# Patient Record
Sex: Male | Born: 1965 | Race: Black or African American | Hispanic: No | Marital: Single | Smoking: Former smoker
Health system: Southern US, Community
[De-identification: ages and names within clinical notes are randomized; demographics above are authoritative.]

## PROBLEM LIST (undated history)

## (undated) DIAGNOSIS — N184 Chronic kidney disease, stage 4 (severe): Secondary | ICD-10-CM

## (undated) DIAGNOSIS — I509 Heart failure, unspecified: Secondary | ICD-10-CM

## (undated) DIAGNOSIS — I10 Essential (primary) hypertension: Secondary | ICD-10-CM

## (undated) DIAGNOSIS — J45909 Unspecified asthma, uncomplicated: Secondary | ICD-10-CM

## (undated) DIAGNOSIS — M109 Gout, unspecified: Secondary | ICD-10-CM

## (undated) DIAGNOSIS — C20 Malignant neoplasm of rectum: Secondary | ICD-10-CM

## (undated) DIAGNOSIS — E119 Type 2 diabetes mellitus without complications: Secondary | ICD-10-CM

## (undated) HISTORY — DX: Chronic kidney disease, stage 4 (severe): N18.4

## (undated) HISTORY — DX: Malignant neoplasm of rectum: C20

## (undated) HISTORY — PX: NO PAST SURGERIES: SHX2092

## (undated) NOTE — *Deleted (*Deleted)
HD#1 Subjective:   Overnight Events: ***  ***  Objective:   Vital signs in last 24 hours: Vitals:   01/06/20 1930 01/06/20 2125 01/06/20 2204 01/07/20 0201  BP: 119/82 140/71 (!) 170/94 137/73  Pulse: (!) 53 62 66 (!) 59  Resp: (!) 23 (!) 21 (!) 22 18  Temp:   97.6 F (36.4 C) 97.7 F (36.5 C)  TempSrc:   Oral Oral  SpO2: 98% 97% 96% 98%  Weight:   121.2 kg   Height:       Supplemental O2: {NAMES:3044014::"Room Air","Nasal Cannula","Simple Face Mask","Partial Rebreather","HFNC","Non Rebreather","Venturi Mask","Bag Valve Mask"} SpO2: 98 % O2 Flow Rate (L/min): 2 L/min  Physical Exam Constitutional: well-appearing *** sitting in chair, in no acute distress HENT: normocephalic atraumatic, mucous membranes moist Eyes: conjunctiva non-erythematous Neck: supple Cardiovascular: regular rate and rhythm, no m/r/g Pulmonary/Chest: normal work of breathing on room air, lungs clear to auscultation bilaterally Abdominal: soft, non-tender, non-distended MSK: normal bulk and tone Neurological: alert & oriented x 3, 5/5 strength in bilateral upper and lower extremities, normal gait Skin: *** Psych: ***  Filed Weights   01/06/20 1617 01/06/20 2204  Weight: 117.9 kg 121.2 kg     Intake/Output Summary (Last 24 hours) at 01/07/2020 0559 Last data filed at 01/07/2020 0558 Gross per 24 hour  Intake 240 ml  Output 1280 ml  Net -1040 ml   Net IO Since Admission: -1,040 mL [01/07/20 0559]  Pertinent Labs: CBC Latest Ref Rng & Units 01/07/2020 01/06/2020 01/06/2020  WBC 4.0 - 10.5 K/uL 12.4(H) - 11.0(H)  Hemoglobin 13.0 - 17.0 g/dL 10.8(L) 10.5(L) 10.1(L)  Hematocrit 39 - 52 % 34.3(L) 31.0(L) 33.3(L)  Platelets 150 - 400 K/uL 241 - 207    CMP Latest Ref Rng & Units 01/07/2020 01/06/2020 01/06/2020  Glucose 70 - 99 mg/dL 83 96 102(H)  BUN 6 - 20 mg/dL 102(H) 110(H) 100(H)  Creatinine 0.61 - 1.24 mg/dL 5.03(H) 5.60(H) 4.92(H)  Sodium 135 - 145 mmol/L 137 139 136  Potassium 3.5 -  5.1 mmol/L 5.2(H) 5.3(H) 5.2(H)  Chloride 98 - 111 mmol/L 106 111 110  CO2 22 - 32 mmol/L 16(L) - 15(L)  Calcium 8.9 - 10.3 mg/dL 8.3(L) - 8.2(L)  Total Protein 6.0 - 8.5 g/dL - - -  Total Bilirubin 0.0 - 1.2 mg/dL - - -  Alkaline Phos 44 - 121 IU/L - - -  AST 0 - 40 IU/L - - -  ALT 0 - 44 IU/L - - -    Imaging: DG Chest Port 1 View  Result Date: 01/06/2020 CLINICAL DATA:  65 year old male with shortness of breath and bilateral lower extremity swelling. EXAM: PORTABLE CHEST 1 VIEW COMPARISON:  Chest radiograph dated 12/25/2019. FINDINGS: There is mild cardiomegaly with mild central vascular congestion. No edema. No focal consolidation, pleural effusion, or pneumothorax. No acute osseous pathology. IMPRESSION: 1. No focal consolidation or edema. 2. Mild cardiomegaly with probable mild central vascular congestion. Electronically Signed   By: Anner Crete M.D.   On: 01/06/2020 16:59    Assessment/Plan:   Principal Problem:   Volume overload Active Problems:   Type 2 diabetes mellitus without complication (HCC)   Essential hypertension   Asthma   Acute renal failure superimposed on stage 4 chronic kidney disease ([place])   Patient Summary: Craig West is a 29 y.o. *** with a pertinent PMH of ***, who presented with *** and was admitted for ***.    *** ***  *** ***  *** ***  *** ***  Diet: [id number] Healthy","Carb-Modified","Renal","Carb/Renal","NPO","TPN","Tube Feeds"} IVF: {NAMES:3044014::"None","NS","1/2 NS","LR","D5","D10"},{NAMES:3044014::"None","10cc/hr","25cc/hr","50cc/hr","75cc/hr","100cc/hr","110cc/hr","125cc/hr","Bolus"} VTE: {NAMES:3044014::"Heparin","Enoxaparin","SCDs","NOAC","None"} Code: {NAMES:3044014::"Full","DNR","DNI","DNR/DNI","Comfort Care","Unknown"} PT/OT recs: {NAMES:3044014::"None","Pending","CIR","SNF for Subacute PT","LTAC","Home Health"}, {Assistive Devices JX:[id number]. TOC recs: ***   Dispo: Anticipated discharge to  {Discharge Destination:18313::"Home"} in {NUMBERS:20191} days pending ***.    Please contact the on call pager after 5 pm and on weekends at [phone].  Alexandria Lodge, MD PGY-1 Internal Medicine Teaching Service Pager: [phone] 01/07/2020

---

## 2007-06-14 ENCOUNTER — Emergency Department (HOSPITAL_COMMUNITY): Admission: EM | Admit: 2007-06-14 | Discharge: 2007-06-14 | Payer: Self-pay | Admitting: Emergency Medicine

## 2007-10-05 ENCOUNTER — Emergency Department (HOSPITAL_COMMUNITY): Admission: EM | Admit: 2007-10-05 | Discharge: 2007-10-05 | Payer: Self-pay | Admitting: Emergency Medicine

## 2007-12-17 ENCOUNTER — Emergency Department (HOSPITAL_COMMUNITY): Admission: EM | Admit: 2007-12-17 | Discharge: 2007-12-17 | Payer: Self-pay | Admitting: Family Medicine

## 2008-02-27 ENCOUNTER — Emergency Department (HOSPITAL_COMMUNITY): Admission: EM | Admit: 2008-02-27 | Discharge: 2008-02-27 | Payer: Self-pay | Admitting: Emergency Medicine

## 2008-07-12 ENCOUNTER — Emergency Department (HOSPITAL_COMMUNITY): Admission: EM | Admit: 2008-07-12 | Discharge: 2008-07-12 | Payer: Self-pay | Admitting: Emergency Medicine

## 2008-12-25 ENCOUNTER — Emergency Department (HOSPITAL_COMMUNITY): Admission: EM | Admit: 2008-12-25 | Discharge: 2008-12-25 | Payer: Self-pay | Admitting: Emergency Medicine

## 2009-06-23 ENCOUNTER — Emergency Department (HOSPITAL_COMMUNITY): Admission: EM | Admit: 2009-06-23 | Discharge: 2009-06-23 | Payer: Self-pay | Admitting: Emergency Medicine

## 2009-07-25 ENCOUNTER — Emergency Department (HOSPITAL_COMMUNITY): Admission: EM | Admit: 2009-07-25 | Discharge: 2009-07-25 | Payer: Self-pay | Admitting: Emergency Medicine

## 2009-09-19 ENCOUNTER — Ambulatory Visit: Payer: Self-pay | Admitting: Internal Medicine

## 2009-09-19 LAB — CONVERTED CEMR LAB
BUN: 18 mg/dL (ref 6–23)
Benzodiazepines.: NEGATIVE
CO2: 25 meq/L (ref 19–32)
Chloride: 97 meq/L (ref 96–112)
Creatinine,U: 108.2 mg/dL
Glucose, Bld: 278 mg/dL — ABNORMAL HIGH (ref 70–99)
Hgb A1c MFr Bld: 9.5 % — ABNORMAL HIGH (ref <5.7)
Marijuana Metabolite: NEGATIVE
Methadone: NEGATIVE
Microalb, Ur: 31.2 mg/dL — ABNORMAL HIGH (ref 0.00–1.89)
Phencyclidine (PCP): NEGATIVE

## 2009-09-28 ENCOUNTER — Ambulatory Visit: Payer: Self-pay | Admitting: Internal Medicine

## 2009-10-31 ENCOUNTER — Ambulatory Visit: Payer: Self-pay | Admitting: Internal Medicine

## 2009-11-14 ENCOUNTER — Ambulatory Visit: Payer: Self-pay | Admitting: Internal Medicine

## 2010-06-19 LAB — GLUCOSE, CAPILLARY: Glucose-Capillary: 195 mg/dL — ABNORMAL HIGH (ref 70–99)

## 2010-06-24 LAB — POCT I-STAT, CHEM 8
BUN: 24 mg/dL — ABNORMAL HIGH (ref 6–23)
Creatinine, Ser: 1.5 mg/dL (ref 0.4–1.5)
Glucose, Bld: 154 mg/dL — ABNORMAL HIGH (ref 70–99)
Potassium: 3.6 mEq/L (ref 3.5–5.1)
Sodium: 140 mEq/L (ref 135–145)

## 2010-07-06 LAB — POCT I-STAT, CHEM 8
BUN: 17 mg/dL (ref 6–23)
Calcium, Ion: 1.05 mmol/L — ABNORMAL LOW (ref 1.12–1.32)
Chloride: 104 mEq/L (ref 96–112)
Creatinine, Ser: 1.2 mg/dL (ref 0.4–1.5)
Glucose, Bld: 188 mg/dL — ABNORMAL HIGH (ref 70–99)
HCT: 48 % (ref 39.0–52.0)
Hemoglobin: 16.3 g/dL (ref 13.0–17.0)
Potassium: 3.8 mEq/L (ref 3.5–5.1)
Sodium: 138 meq/L (ref 135–145)
TCO2: 26 mmol/L (ref 0–100)

## 2010-07-06 LAB — GLUCOSE, CAPILLARY: Glucose-Capillary: 252 mg/dL — ABNORMAL HIGH (ref 70–99)

## 2010-07-06 LAB — WOUND CULTURE: Culture: NO GROWTH

## 2010-07-11 LAB — URINALYSIS, ROUTINE W REFLEX MICROSCOPIC
Bilirubin Urine: NEGATIVE
Glucose, UA: >1000 mg/dL — AB
Hgb urine dipstick: NEGATIVE
Nitrite: NEGATIVE
Specific Gravity, Urine: 1.035 — ABNORMAL HIGH (ref 1.005–1.030)
pH: 5.5 (ref 5.0–8.0)

## 2010-07-11 LAB — BASIC METABOLIC PANEL
BUN: 14 mg/dL (ref 6–23)
CO2: 28 mEq/L (ref 19–32)
Creatinine, Ser: 1.01 mg/dL (ref 0.4–1.5)
GFR calc non Af Amer: >60 mL/min (ref >60)

## 2010-07-11 LAB — URINE MICROSCOPIC-ADD ON

## 2010-12-24 LAB — I-STAT 8, (EC8 V) (CONVERTED LAB)
Glucose, Bld: 193 — ABNORMAL HIGH
Hemoglobin: 16
Operator id: 277751
Sodium: 137
pCO2, Ven: 38.7 — ABNORMAL LOW
pH, Ven: 7.444 — ABNORMAL HIGH

## 2010-12-24 LAB — POCT I-STAT CREATININE
Creatinine, Ser: 1.4
Operator id: 277751

## 2010-12-24 LAB — CBC
MCHC: 33.9
MCV: 89.1

## 2010-12-24 LAB — DIFFERENTIAL
Basophils Absolute: 0.1
Basophils Relative: 0
Eosinophils Absolute: 0.3
Lymphs Abs: 3.2
Monocytes Absolute: 0.5
Monocytes Relative: 4
Neutro Abs: 10.1 — ABNORMAL HIGH

## 2010-12-24 LAB — POCT CARDIAC MARKERS: CKMB, poc: 1.8

## 2011-01-01 LAB — POCT I-STAT, CHEM 8
Hemoglobin: 14.6
Potassium: 5.3 — ABNORMAL HIGH
Sodium: 137

## 2011-01-01 LAB — POCT CARDIAC MARKERS
CKMB, poc: 4
Myoglobin, poc: 127

## 2014-01-22 ENCOUNTER — Emergency Department (HOSPITAL_COMMUNITY)
Admission: EM | Admit: 2014-01-22 | Discharge: 2014-01-22 | Disposition: A | Discharge status: Home / Self Care | Payer: Self-pay | Attending: Emergency Medicine | Admitting: Emergency Medicine

## 2014-01-22 ENCOUNTER — Encounter (HOSPITAL_COMMUNITY): Payer: Self-pay | Admitting: Emergency Medicine

## 2014-01-22 DIAGNOSIS — I10 Essential (primary) hypertension: Secondary | ICD-10-CM | POA: Insufficient documentation

## 2014-01-22 DIAGNOSIS — E119 Type 2 diabetes mellitus without complications: Secondary | ICD-10-CM | POA: Insufficient documentation

## 2014-01-22 DIAGNOSIS — L2389 Allergic contact dermatitis due to other agents: Secondary | ICD-10-CM | POA: Insufficient documentation

## 2014-01-22 DIAGNOSIS — L0889 Other specified local infections of the skin and subcutaneous tissue: Secondary | ICD-10-CM | POA: Insufficient documentation

## 2014-01-22 DIAGNOSIS — S50811A Abrasion of right forearm, initial encounter: Secondary | ICD-10-CM | POA: Insufficient documentation

## 2014-01-22 DIAGNOSIS — L089 Local infection of the skin and subcutaneous tissue, unspecified: Secondary | ICD-10-CM

## 2014-01-22 DIAGNOSIS — Z720 Tobacco use: Secondary | ICD-10-CM | POA: Insufficient documentation

## 2014-01-22 DIAGNOSIS — X58XXXA Exposure to other specified factors, initial encounter: Secondary | ICD-10-CM | POA: Insufficient documentation

## 2014-01-22 DIAGNOSIS — Z8679 Personal history of other diseases of the circulatory system: Secondary | ICD-10-CM

## 2014-01-22 DIAGNOSIS — Y9389 Activity, other specified: Secondary | ICD-10-CM | POA: Insufficient documentation

## 2014-01-22 DIAGNOSIS — L239 Allergic contact dermatitis, unspecified cause: Secondary | ICD-10-CM

## 2014-01-22 DIAGNOSIS — Y990 Civilian activity done for income or pay: Secondary | ICD-10-CM | POA: Insufficient documentation

## 2014-01-22 DIAGNOSIS — Y9269 Other specified industrial and construction area as the place of occurrence of the external cause: Secondary | ICD-10-CM | POA: Insufficient documentation

## 2014-01-22 HISTORY — DX: Essential (primary) hypertension: I10

## 2014-01-22 HISTORY — DX: Type 2 diabetes mellitus without complications: E11.9

## 2014-01-22 MED ORDER — PREDNISONE 20 MG PO TABS: ORAL_TABLET | ORAL | Status: DC

## 2014-01-22 MED ORDER — DIPHENHYDRAMINE HCL 25 MG PO CAPS: 25.0000 mg | ORAL_CAPSULE | Freq: Once | ORAL | Status: DC

## 2014-01-22 MED ORDER — PREDNISONE 20 MG PO TABS
60.0000 mg | ORAL_TABLET | Freq: Once | ORAL | Status: AC
  Administered 2014-01-22: 60 mg via ORAL
  Filled 2014-01-22: qty 3

## 2014-01-22 MED ORDER — TRAMADOL HCL 50 MG PO TABS: 50.0000 mg | ORAL_TABLET | Freq: Four times a day (QID) | ORAL | Status: DC | PRN

## 2014-01-22 MED ORDER — TRAMADOL HCL 50 MG PO TABS
50.0000 mg | ORAL_TABLET | Freq: Once | ORAL | Status: AC
  Administered 2014-01-22: 50 mg via ORAL
  Filled 2014-01-22: qty 1

## 2014-01-22 MED ORDER — LISINOPRIL-HYDROCHLOROTHIAZIDE 10-12.5 MG PO TABS: 1.0000 | ORAL_TABLET | Freq: Every day | ORAL | Status: DC

## 2014-01-22 MED ORDER — DIPHENHYDRAMINE HCL 25 MG PO CAPS
25.0000 mg | ORAL_CAPSULE | Freq: Once | ORAL | Status: AC
  Administered 2014-01-22: 25 mg via ORAL
  Filled 2014-01-22: qty 1

## 2014-01-22 MED ORDER — MUPIROCIN CALCIUM 2 % EX CREA: 1.0000 "application " | TOPICAL_CREAM | Freq: Three times a day (TID) | CUTANEOUS | Status: DC

## 2014-01-22 NOTE — ED Notes (Signed)
Declined W/C at D/C and was escorted to lobby by RN. 

## 2014-01-22 NOTE — ED Notes (Signed)
Hes had redness, pain and itching to bilateral arms from hands to elbows x 5 days. He tried anti-itch cream with no relief. 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 He denies any SOB, rash on rest of body.
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
## 2014-01-22 NOTE — Discharge Instructions (Signed)
Abrasions An abrasion is a cut or scrape of the skin. Abrasions do not go through all layers of the skin. HOME CARE  If a bandage (dressing) was put on your wound, change it as told by your doctor. If the bandage sticks, soak it off with warm.  Wash the area with water and soap 2 times a day. Rinse off the soap. Pat the area dry with a clean towel.  Put on medicated cream (ointment) as told by your doctor.  Change your bandage right away if it gets wet or dirty.  Only take medicine as told by your doctor.  See your doctor within 24-48 hours to get your wound checked.  Check your wound for redness, puffiness (swelling), or yellowish-white fluid (pus). GET HELP RIGHT AWAY IF:   You have more pain in the wound.  You have redness, swelling, or tenderness around the wound.  You have pus coming from the wound.  You have a fever or lasting symptoms for more than 2-3 days.  You have a fever and your symptoms suddenly get worse.  You have a bad smell coming from the wound or bandage. MAKE SURE YOU:   Understand these instructions.  Will watch your condition.  Will get help right away if you are not doing well or get worse. Document Released: 09/04/2007 Document Revised: 12/11/2011 Document Reviewed: 02/19/2011 ExitCare Patient Information 2015 [employer]. This information is not intended to replace advice given to you by your health care provider. Make sure you discuss any questions you have with your health care provider.  

## 2014-01-23 NOTE — ED Provider Notes (Signed)
Medical screening examination/treatment/procedure(s) were performed by non-physician practitioner and as supervising physician I was immediately available for consultation/collaboration.   EKG Interpretation None        Pamella Pert, MD 01/23/14 1102

## 2014-01-23 NOTE — Progress Notes (Signed)
CARE MANAGEMENT NOTE 01/23/2014  Patient:  Craig West, Craig West   Account Number:  [id number]  Date Initiated:  01/23/2014  Documentation initiated by:  [facility]  Subjective/Objective Assessment:   ED     Action/Plan:   med asst   Anticipated DC Date:  01/23/2014   Anticipated DC Plan:           Choice offered to / List presented to:             Status of service:  Completed, signed off Medicare Important Message given?   (If response is "NO", the following Medicare IM given date fields will be blank) Date Medicare IM given:   Medicare IM given by:   Date Additional Medicare IM given:   Additional Medicare IM given by:    Discharge Disposition:  HOME/SELF CARE  Per UR Regulation:    If discussed at Long Length of Stay Meetings, dates discussed:    Comments:  01/22/14 17:10 CM met with pt in ED and gave pt [place] letter with list of participating pharmacies.  Pt verbalized understanding of MATCH parameters.  CM gave pt [place] pamphlet and pt verbalized understanding he is to go to the Clinic any weekday morning from 9-10am and ask for: AN APPOINTMENT FOR A PCP; AN APPOINTMENT WITH A NAVIGATOR TO SECURE INSURANCE; AN APPOINTMENT FOR FOLLOW UP MEDICAL CARE.  No other CM needs were communicated.  Mariane Masters, BSN, CM [phone].

## 2014-01-27 NOTE — Care Management Note (Signed)
    Page 1 of 1   01/23/2014     7:46:07 AM CARE MANAGEMENT NOTE 01/23/2014  Patient:  TERRACE, FONTANILLA   Account Number:  [id number]  Date Initiated:  01/23/2014  Documentation initiated by:  [facility]  Subjective/Objective Assessment:   ED     Action/Plan:   med asst   Anticipated DC Date:  01/23/2014   Anticipated DC Plan:           Choice offered to / List presented to:             Status of service:  Completed, signed off Medicare Important Message given?   (If response is "NO", the following Medicare IM given date fields will be blank) Date Medicare IM given:   Medicare IM given by:   Date Additional Medicare IM given:   Additional Medicare IM given by:    Discharge Disposition:  HOME/SELF CARE  Per UR Regulation:    If discussed at Long Length of Stay Meetings, dates discussed:    Comments:  01/22/14 17:10 CM met with pt in ED and gave pt [place] letter with list of participating pharmacies.  Pt verbalized understanding of MATCH parameters.  CM gave pt [place] pamphlet and pt verbalized understanding he is to go to the Clinic any weekday morning from 9-10am and ask for: AN APPOINTMENT FOR A PCP; AN APPOINTMENT WITH A NAVIGATOR TO SECURE INSURANCE; AN APPOINTMENT FOR FOLLOW UP MEDICAL CARE.  No other CM needs were communicated.  Mariane Masters, BSN, CM [phone].

## 2014-02-10 ENCOUNTER — Emergency Department (HOSPITAL_COMMUNITY)
Admission: EM | Admit: 2014-02-10 | Discharge: 2014-02-11 | Disposition: A | Discharge status: Home / Self Care | Payer: Self-pay | Attending: Emergency Medicine | Admitting: Emergency Medicine

## 2014-02-10 ENCOUNTER — Encounter (HOSPITAL_COMMUNITY): Payer: Self-pay | Admitting: Emergency Medicine

## 2014-02-10 DIAGNOSIS — S0003XA Contusion of scalp, initial encounter: Secondary | ICD-10-CM

## 2014-02-10 DIAGNOSIS — Z720 Tobacco use: Secondary | ICD-10-CM | POA: Insufficient documentation

## 2014-02-10 DIAGNOSIS — I10 Essential (primary) hypertension: Secondary | ICD-10-CM | POA: Insufficient documentation

## 2014-02-10 DIAGNOSIS — Y9289 Other specified places as the place of occurrence of the external cause: Secondary | ICD-10-CM | POA: Insufficient documentation

## 2014-02-10 DIAGNOSIS — Z23 Encounter for immunization: Secondary | ICD-10-CM | POA: Insufficient documentation

## 2014-02-10 DIAGNOSIS — Y998 Other external cause status: Secondary | ICD-10-CM | POA: Insufficient documentation

## 2014-02-10 DIAGNOSIS — Z79899 Other long term (current) drug therapy: Secondary | ICD-10-CM | POA: Insufficient documentation

## 2014-02-10 DIAGNOSIS — M62830 Muscle spasm of back: Secondary | ICD-10-CM | POA: Insufficient documentation

## 2014-02-10 DIAGNOSIS — S40811A Abrasion of right upper arm, initial encounter: Secondary | ICD-10-CM | POA: Insufficient documentation

## 2014-02-10 DIAGNOSIS — S300XXA Contusion of lower back and pelvis, initial encounter: Secondary | ICD-10-CM | POA: Insufficient documentation

## 2014-02-10 DIAGNOSIS — S01511A Laceration without foreign body of lip, initial encounter: Secondary | ICD-10-CM | POA: Insufficient documentation

## 2014-02-10 DIAGNOSIS — J45909 Unspecified asthma, uncomplicated: Secondary | ICD-10-CM | POA: Insufficient documentation

## 2014-02-10 DIAGNOSIS — E119 Type 2 diabetes mellitus without complications: Secondary | ICD-10-CM | POA: Insufficient documentation

## 2014-02-10 DIAGNOSIS — Z7952 Long term (current) use of systemic steroids: Secondary | ICD-10-CM | POA: Insufficient documentation

## 2014-02-10 DIAGNOSIS — Y9389 Activity, other specified: Secondary | ICD-10-CM | POA: Insufficient documentation

## 2014-02-10 HISTORY — DX: Unspecified asthma, uncomplicated: J45.909

## 2014-02-10 MED ORDER — OXYCODONE-ACETAMINOPHEN 5-325 MG PO TABS
1.0000 | ORAL_TABLET | Freq: Once | ORAL | Status: AC
  Administered 2014-02-10: 1 via ORAL
  Filled 2014-02-10: qty 1

## 2014-02-10 MED ORDER — TETANUS-DIPHTH-ACELL PERTUSSIS 5-2.5-18.5 LF-MCG/0.5 IM SUSP
0.5000 mL | Freq: Once | INTRAMUSCULAR | Status: AC
  Administered 2014-02-11: 0.5 mL via INTRAMUSCULAR
  Filled 2014-02-10: qty 0.5

## 2014-02-10 MED ORDER — HYDROCODONE-ACETAMINOPHEN 5-325 MG PO TABS
1.0000 | ORAL_TABLET | Freq: Once | ORAL | Status: AC
  Administered 2014-02-11: 1 via ORAL
  Filled 2014-02-10: qty 1
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
## 2014-02-11 MED ORDER — NAPROXEN 500 MG PO TABS: 500.0000 mg | ORAL_TABLET | Freq: Two times a day (BID) | ORAL | Status: DC | PRN

## 2014-02-11 MED ORDER — HYDROCODONE-ACETAMINOPHEN 5-325 MG PO TABS: 1.0000 | ORAL_TABLET | Freq: Four times a day (QID) | ORAL | Status: DC | PRN

## 2014-02-11 MED ORDER — CYCLOBENZAPRINE HCL 10 MG PO TABS: 10.0000 mg | ORAL_TABLET | Freq: Three times a day (TID) | ORAL | Status: DC | PRN

## 2014-02-11 NOTE — Discharge Instructions (Signed)
Use naprosyn and vicodin for pain, but don't drive while taking vicodin. Get plenty of rest, use ice on your head and the areas of pain, no more than 20 minutes at a time every hour. Use flexeril as needed for muscle spasms in your back. Avoid heavy lifting until the pain resolves in your back. Keep all wounds clean and dry, covered with bandages and change then twice daily. Monitor for signs of infection such as drainage redness or swelling. Follow Up with primary care physician/[facility] in 1 week for recheck of symptoms. If you develop numbness or tingling in your legs or incontinence of urine/stool, or changes in mental status, return to the ER. Return to the emergency department if patient becomes lethargic, begins vomiting or other change in mental status.   Abrasions An abrasion is a cut or scrape of the skin. Abrasions do not go through all layers of the skin. HOME CARE  If a bandage (dressing) was put on your wound, change it as told by your doctor. If the bandage sticks, soak it off with warm.  Wash the area with water and soap 2 times a day. Rinse off the soap. Pat the area dry with a clean towel.  Put on medicated cream (ointment) as told by your doctor.  Change your bandage right away if it gets wet or dirty.  Only take medicine as told by your doctor.  See your doctor within 24-48 hours to get your wound checked.  Check your wound for redness, puffiness (swelling), or yellowish-white fluid (pus). GET HELP RIGHT AWAY IF:   You have more pain in the wound.  You have redness, swelling, or tenderness around the wound.  You have pus coming from the wound.  You have a fever or lasting symptoms for more than 2-3 days.  You have a fever and your symptoms suddenly get worse.  You have a bad smell coming from the wound or bandage. MAKE SURE YOU:   Understand these instructions.  Will watch your condition.  Will get help right away if you are not doing well or  get worse. Document Released: 09/04/2007 Document Revised: 12/11/2011 Document Reviewed: 02/19/2011 [facility] Patient Information 2015 [place], [place]. This information is not intended to replace advice given to you by your health care provider. Make sure you discuss any questions you have with your health care provider.  Contusion A contusion is a deep bruise. Contusions happen when an injury causes bleeding under the skin. Signs of bruising include pain, puffiness (swelling), and discolored skin. The contusion may turn blue, purple, or yellow. HOME CARE   Put ice on the injured area.  Put ice in a plastic bag.  Place a towel between your skin and the bag.  Leave the ice on for 15-20 minutes, 03-04 times a day.  Only take medicine as told by your doctor.  Rest the injured area.  If possible, raise (elevate) the injured area to lessen puffiness. GET HELP RIGHT AWAY IF:   You have more bruising or puffiness.  You have pain that is getting worse.  Your puffiness or pain is not helped by medicine. MAKE SURE YOU:   Understand these instructions.  Will watch your condition.  Will get help right away if you are not doing well or get worse. Document Released: 09/04/2007 Document Revised: 06/10/2011 Document Reviewed: 01/21/2011 [facility] Patient Information 2015 [place], [place]. This information is not intended to replace advice given to you by your health care provider. Make sure  you discuss any questions you have with your health care provider.  Facial or Scalp Contusion A facial or scalp contusion is a deep bruise on the face or head. Injuries to the face and head generally cause a lot of swelling, especially around the eyes. Contusions are the result of an injury that caused bleeding under the skin. The contusion may turn blue, purple, or yellow. Minor injuries will give you a painless contusion, but more severe contusions may stay painful and swollen for a few weeks.  CAUSES  A  facial or scalp contusion is caused by a blunt injury or trauma to the face or head area.  SIGNS AND SYMPTOMS   Swelling of the injured area.   Discoloration of the injured area.   Tenderness, soreness, or pain in the injured area.  DIAGNOSIS  The diagnosis can be made by taking a medical history and doing a physical exam. An X-ray exam, CT scan, or MRI may be needed to determine if there are any associated injuries, such as broken bones (fractures). TREATMENT  Often, the best treatment for a facial or scalp contusion is applying cold compresses to the injured area. Over-the-counter medicines may also be recommended for pain control.  HOME CARE INSTRUCTIONS   Only take over-the-counter or prescription medicines as directed by your health care provider.   Apply ice to the injured area.   Put ice in a plastic bag.   Place a towel between your skin and the bag.   Leave the ice on for 20 minutes, 2-3 times a day.  SEEK MEDICAL CARE IF:  You have bite problems.   You have pain with chewing.   You are concerned about facial defects. SEEK IMMEDIATE MEDICAL CARE IF:  You have severe pain or a headache that is not relieved by medicine.   You have unusual sleepiness, confusion, or personality changes.   You throw up (vomit).   You have a persistent nosebleed.   You have double vision or blurred vision.   You have fluid drainage from your nose or ear.   You have difficulty walking or using your arms or legs.  MAKE SURE YOU:   Understand these instructions.  Will watch your condition.  Will get help right away if you are not doing well or get worse. Document Released: 04/25/2004 Document Revised: 01/06/2013 Document Reviewed: 10/29/2012 [facility] Patient Information 2015 [place], [place]. This information is not intended to replace advice given to you by your health care provider. Make sure you discuss any questions you have with your health care  provider.   Cryotherapy Cryotherapy is when you put ice on your injury. Ice helps lessen pain and puffiness (swelling) after an injury. Ice works the best when you start using it in the first 24 to 48 hours after an injury. HOME CARE  Put a dry or damp towel between the ice pack and your skin.  You may press gently on the ice pack.  Leave the ice on for no more than 10 to 20 minutes at a time.  Check your skin after 5 minutes to make sure your skin is okay.  Rest at least 20 minutes between ice pack uses.  Stop using ice when your skin loses feeling (numbness).  Do not use ice on someone who cannot tell you when it hurts. This includes small children and people with memory problems (dementia). GET HELP RIGHT AWAY IF:  You have white spots on your skin.  Your skin turns blue or  pale.  Your skin feels waxy or hard.  Your puffiness gets worse. MAKE SURE YOU:   Understand these instructions.  Will watch your condition.  Will get help right away if you are not doing well or get worse. Document Released: 09/04/2007 Document Revised: 06/10/2011 Document Reviewed: 11/08/2010 [facility] Patient Information 2015 [place], [place]. This information is not intended to replace advice given to you by your health care provider. Make sure you discuss any questions you have with your health care provider.   Muscle Cramps and Spasms Muscle cramps and spasms are when muscles tighten by themselves. They usually get better within minutes. Muscle cramps are painful. They are usually stronger and last longer than muscle spasms. Muscle spasms may or may not be painful. They can last a few seconds or much longer. HOME CARE  Drink enough fluid to keep your pee (urine) clear or pale yellow.  Massage, stretch, and relax the muscle.  Use a warm towel, heating pad, or warm shower water on tight muscles.  Place ice on the muscle if it is tender or in pain.  Put ice in a plastic bag.  Place a towel  between your skin and the bag.  Leave the ice on for 15-20 minutes, 03-04 times a day.  Only take medicine as told by your doctor. GET HELP RIGHT AWAY IF:  Your cramps or spasms get worse, happen more often, or do not get better with time. MAKE SURE YOU:  Understand these instructions.  Will watch your condition.  Will get help right away if you are not doing well or get worse. Document Released: 02/29/2008 Document Revised: 07/13/2012 Document Reviewed: 03/04/2012 [facility] Patient Information 2015 [place], [place]. This information is not intended to replace advice given to you by your health care provider. Make sure you discuss any questions you have with your health care provider.  Wound Care Wound care helps prevent pain and infection.  You may need a tetanus shot if:  You cannot remember when you had your last tetanus shot.  You have never had a tetanus shot.  The injury broke your skin. If you need a tetanus shot and you choose not to have one, you may get tetanus. Sickness from tetanus can be serious. HOME CARE   Only take medicine as told by your doctor.  Clean the wound daily with mild soap and water.  Change any bandages (dressings) as told by your doctor.  Put medicated cream and a bandage on the wound as told by your doctor.  Change the bandage if it gets wet, dirty, or starts to smell.  Take showers. Do not take baths, swim, or do anything that puts your wound under water.  Rest and raise (elevate) the wound until the pain and puffiness (swelling) are better.  Keep all doctor visits as told. GET HELP RIGHT AWAY IF:   Yellowish-white fluid (pus) comes from the wound.  Medicine does not lessen your pain.  There is a red streak going away from the wound.  You have a fever. MAKE SURE YOU:   Understand these instructions.  Will watch your condition.  Will get help right away if you are not doing well or get worse. Document Released: 12/26/2007  Document Revised: 06/10/2011 Document Reviewed: 07/22/2010 [facility] Patient Information 2015 [place], [place]. This information is not intended to replace advice given to you by your health care provider. Make sure you discuss any questions you have with your health care provider.

## 2014-05-12 ENCOUNTER — Encounter (HOSPITAL_COMMUNITY): Payer: Self-pay | Admitting: Emergency Medicine

## 2014-05-12 ENCOUNTER — Emergency Department (HOSPITAL_COMMUNITY)
Admission: EM | Admit: 2014-05-12 | Discharge: 2014-05-12 | Disposition: A | Discharge status: Home / Self Care | Payer: Self-pay | Attending: Emergency Medicine | Admitting: Emergency Medicine

## 2014-05-12 DIAGNOSIS — M1A4720 Other secondary chronic gout, left ankle and foot, without tophus (tophi): Secondary | ICD-10-CM | POA: Insufficient documentation

## 2014-05-12 DIAGNOSIS — Z7952 Long term (current) use of systemic steroids: Secondary | ICD-10-CM | POA: Insufficient documentation

## 2014-05-12 DIAGNOSIS — M109 Gout, unspecified: Secondary | ICD-10-CM

## 2014-05-12 DIAGNOSIS — Z79899 Other long term (current) drug therapy: Secondary | ICD-10-CM | POA: Insufficient documentation

## 2014-05-12 DIAGNOSIS — E119 Type 2 diabetes mellitus without complications: Secondary | ICD-10-CM | POA: Insufficient documentation

## 2014-05-12 DIAGNOSIS — M79672 Pain in left foot: Secondary | ICD-10-CM | POA: Insufficient documentation

## 2014-05-12 DIAGNOSIS — J45909 Unspecified asthma, uncomplicated: Secondary | ICD-10-CM | POA: Insufficient documentation

## 2014-05-12 DIAGNOSIS — Z720 Tobacco use: Secondary | ICD-10-CM | POA: Insufficient documentation

## 2014-05-12 DIAGNOSIS — I10 Essential (primary) hypertension: Secondary | ICD-10-CM | POA: Insufficient documentation

## 2014-05-12 DIAGNOSIS — Z792 Long term (current) use of antibiotics: Secondary | ICD-10-CM | POA: Insufficient documentation

## 2014-05-12 MED ORDER — PREDNISONE 20 MG PO TABS: 20.0000 mg | ORAL_TABLET | Freq: Every day | ORAL | Status: DC

## 2014-05-12 MED ORDER — HYDROCODONE-ACETAMINOPHEN 5-325 MG PO TABS: 1.0000 | ORAL_TABLET | Freq: Four times a day (QID) | ORAL | Status: DC | PRN

## 2014-05-12 MED ORDER — INDOMETHACIN 25 MG PO CAPS: 50.0000 mg | ORAL_CAPSULE | Freq: Three times a day (TID) | ORAL | Status: DC | PRN

## 2014-05-12 MED ORDER — HYDROCODONE-ACETAMINOPHEN 5-325 MG PO TABS
2.0000 | ORAL_TABLET | Freq: Once | ORAL | Status: AC
  Administered 2014-05-12: 2 via ORAL
  Filled 2014-05-12: qty 2

## 2014-05-12 NOTE — ED Notes (Signed)
Spoke with Mariann Laster, RN case management in regards to pt lack of PCP and concerns about receiving PCP for HTN. States she will come speak with pt. Pt informed, PA informed.

## 2014-05-12 NOTE — Discharge Instructions (Signed)
Gout Gout is an inflammatory arthritis caused by a buildup of uric acid crystals in the joints. Uric acid is a chemical that is normally present in the blood. When the level of uric acid in the blood is too high it can form crystals that deposit in your joints and tissues. This causes joint redness, soreness, and swelling (inflammation). Repeat attacks are common. Over time, uric acid crystals can form into masses (tophi) near a joint, destroying bone and causing disfigurement. Gout is treatable and often preventable. CAUSES  The disease begins with elevated levels of uric acid in the blood. Uric acid is produced by your body when it breaks down a naturally found substance called purines. Certain foods you eat, such as meats and fish, contain high amounts of purines. Causes of an elevated uric acid level include:  Being passed down from parent to child (heredity).  Diseases that cause increased uric acid production (such as obesity, psoriasis, and certain cancers).  Excessive alcohol use.  Diet, especially diets rich in meat and seafood.  Medicines, including certain cancer-fighting medicines (chemotherapy), water pills (diuretics), and aspirin.  Chronic kidney disease. The kidneys are no longer able to remove uric acid well.  Problems with metabolism. Conditions strongly associated with gout include:  Obesity.  High blood pressure.  High cholesterol.  Diabetes. Not everyone with elevated uric acid levels gets gout. It is not understood why some people get gout and others do not. Surgery, joint injury, and eating too much of certain foods are some of the factors that can lead to gout attacks. SYMPTOMS   An attack of gout comes on quickly. It causes intense pain with redness, swelling, and warmth in a joint.  Fever can occur.  Often, only one joint is involved. Certain joints are more commonly involved:  Base of the big toe.  Knee.  Ankle.  Wrist.  Finger. Without  treatment, an attack usually goes away in a few days to weeks. Between attacks, you usually will not have symptoms, which is different from many other forms of arthritis. DIAGNOSIS  Your caregiver will suspect gout based on your symptoms and exam. In some cases, tests may be recommended. The tests may include:  Blood tests.  Urine tests.  X-rays.  Joint fluid exam. This exam requires a needle to remove fluid from the joint (arthrocentesis). Using a microscope, gout is confirmed when uric acid crystals are seen in the joint fluid. TREATMENT  There are two phases to gout treatment: treating the sudden onset (acute) attack and preventing attacks (prophylaxis).  Treatment of an Acute Attack.  Medicines are used. These include anti-inflammatory medicines or steroid medicines.  An injection of steroid medicine into the affected joint is sometimes necessary.  The painful joint is rested. Movement can worsen the arthritis.  You may use warm or cold treatments on painful joints, depending which works best for you.  Treatment to Prevent Attacks.  If you suffer from frequent gout attacks, your caregiver may advise preventive medicine. These medicines are started after the acute attack subsides. These medicines either help your kidneys eliminate uric acid from your body or decrease your uric acid production. You may need to stay on these medicines for a very long time.  The early phase of treatment with preventive medicine can be associated with an increase in acute gout attacks. For this reason, during the first few months of treatment, your caregiver may also advise you to take medicines usually used for acute gout treatment. Be sure you  understand your caregiver's directions. Your caregiver may make several adjustments to your medicine dose before these medicines are effective.  Discuss dietary treatment with your caregiver or dietitian. Alcohol and drinks high in sugar and fructose and foods  such as meat, poultry, and seafood can increase uric acid levels. Your caregiver or dietitian can advise you on drinks and foods that should be limited. HOME CARE INSTRUCTIONS   Do not take aspirin to relieve pain. This raises uric acid levels.  Only take over-the-counter or prescription medicines for pain, discomfort, or fever as directed by your caregiver.  Rest the joint as much as possible. When in bed, keep sheets and blankets off painful areas.  Keep the affected joint raised (elevated).  Apply warm or cold treatments to painful joints. Use of warm or cold treatments depends on which works best for you.  Use crutches if the painful joint is in your leg.  Drink enough fluids to keep your urine clear or pale yellow. This helps your body get rid of uric acid. Limit alcohol, sugary drinks, and fructose drinks.  Follow your dietary instructions. Pay careful attention to the amount of protein you eat. Your daily diet should emphasize fruits, vegetables, whole grains, and fat-free or low-fat milk products. Discuss the use of coffee, vitamin C, and cherries with your caregiver or dietitian. These may be helpful in lowering uric acid levels.  Maintain a healthy body weight. SEEK MEDICAL CARE IF:   You develop diarrhea, vomiting, or any side effects from medicines.  You do not feel better in 24 hours, or you are getting worse. SEEK IMMEDIATE MEDICAL CARE IF:   Your joint becomes suddenly more tender, and you have chills or a fever. MAKE SURE YOU:   Understand these instructions.  Will watch your condition.  Will get help right away if you are not doing well or get worse. Document Released: 03/15/2000 Document Revised: 08/02/2013 Document Reviewed: 10/30/2011 [facility] Patient Information 2015 [place], [place]. This information is not intended to replace advice given to you by your health care provider. Make sure you discuss any questions you have with your health care  provider.   Emergency Department Resource Guide 1) Find a Doctor and Pay Out of Pocket Although you won't have to find out who is covered by your insurance plan, it is a good idea to ask around and get recommendations. You will then need to call the office and see if the doctor you have chosen will accept you as a new patient and what types of options they offer for patients who are self-pay. Some doctors offer discounts or will set up payment plans for their patients who do not have insurance, but you will need to ask so you aren't surprised when you get to your appointment.  2) Contact Your Local Health Department Not all health departments have doctors that can see patients for sick visits, but many do, so it is worth a call to see if yours does. If you don't know where your local health department is, you can check in your phone book. The CDC also has a tool to help you locate your state's health department, and many state websites also have listings of all of their local health departments.  3) Find a [facility] If your illness is not likely to be very severe or complicated, you may want to try a walk in clinic. These are popping up all over the country in pharmacies, drugstores, and shopping centers. They're usually staffed by  nurse practitioners or physician assistants that have been trained to treat common illnesses and complaints. They're usually fairly quick and inexpensive. However, if you have serious medical issues or chronic medical problems, these are probably not your best option.  No Primary Care Doctor: - Call Health Connect at  [phone] - they can help you locate a primary care doctor that  accepts your insurance, provides certain services, etc. - Physician Referral Service- [phone]  Chronic Pain Problems: Organization         Address  Phone   Notes  [facility]  [phone] Patients need to be referred by their primary care doctor.    Medication Assistance: Organization         Address  Phone   Notes  [facility] Medication [facility] [address] [place], [place] [address] [phone] --Must be a resident of [facility] -- Must have NO insurance coverage whatsoever (no Medicaid/ Medicare, etc.) -- The pt. MUST have a primary care doctor that directs their care regularly and follows them in the community   MedAssist  [phone]   [employer]  [phone]    Agencies that provide inexpensive medical care: Organization         Address  Phone   Notes  [place]  [phone]   [facility]    [phone]   [facility] [place], [place] [address] [phone]   [employer] [address], [place] [phone]   Planned Parenthood    [phone]   [facility]    [phone]   [place] [address] [place] [address], [place] Phone:  [phone], Fax:  [phone] Hours of Operation:  9 am - 6 pm, M-F.  Also accepts Medicaid/Medicare and self-pay.  [facility] for [place], [address], [place] Phone: [phone], Fax: [phone]. Hours of Operation:  8:30 am - 5:30 pm, M-F.  Also accepts Medicaid and self-pay.  [facility] [place] [address], [place] Phone: [phone]   [place], [place], [place] [phone], Ext. [phone] Mondays & Thursdays: 7-9 AM.  First 15 patients are seen on a first come, first serve basis.    [place] Providers:  Organization         Address  Phone   Notes  [facility] [address], [place] [phone] Also accepts self-pay patients.  [facility] [id number] [place], [place]  [phone]   [place], [address], [place] [phone]   [facility] [address], [place] [phone]   Lucianne Lei [address], [place]   [phone] Only accepts [place] Access [place] patients after they have their name applied to their card.   Self-Pay (no insurance) in [facility]:  Organization         Address  Phone   Notes  Sickle Cell Patients, [facility] Internal Medicine [place] [phone]   [facility] [place] [phone]   [facility] [place]  [address], [address], [place] [phone]   [facility]/Dr. [place] Lawman  [address] [place] or [place], [address], [place] [phone] Phone number for both [place] and [place] locations is the same.  Urgent Medical and [facility] [address] [place] [phone]   [facility] [address] [place] or [address] [phone]   [facility] [address], [place] [phone], phone; [phone], fax Sees patients 1st and 3rd Saturday of every month.  Must not qualify for public or private insurance (i.e. Medicaid, Medicare, [place] Health Choice, Veterans' Benefits)  Household income should be no more than 200% of the poverty level The clinic cannot treat you if you are pregnant or think you are pregnant  Sexually transmitted diseases are not treated at the clinic.    Dental Care: Organization         Address  Phone  Notes  [facility] [place] [phone] Accepts children up to age 43 who are enrolled in [place] or [place]; pregnant women with a Medicaid card; and children who have applied for Medicaid or [place] Health Choice, but were declined, whose parents can pay a reduced fee at time of service.   [facility] Department of [facility]  [address], [place] [phone] Accepts children up to age 66 who are enrolled in [place] or [place]; pregnant women with a Medicaid card; and children who have applied for Medicaid or [place] Health Choice, but were declined, whose parents can pay a reduced fee at time of service.  [facility] Adult [facility] Access PROGRAM  [place] [phone] Patients are seen by appointment only. Walk-ins are not accepted. [place] will see patients 93 years of age and older. Monday - Tuesday (8am-5pm) Most Wednesdays (8:30-5pm) $30 per visit, cash only  [facility] Adult Dental Access PROGRAM  [address], [facility] [phone] Patients are seen by appointment only. Walk-ins are not accepted. [place] will see patients 34 years of age and older. One Wednesday Evening (Monthly: Volunteer Based).  $30 per visit, cash only  [place]  [phone] for adults; Children under age 38, call Graduate Pediatric Dentistry at [phone]. Children aged 43-14, please call [phone] to request a pediatric application.  Dental services are provided in all areas of dental care including fillings, crowns and bridges, complete and partial dentures, implants, gum treatment, root canals, and extractions. Preventive care is also provided. Treatment is provided to both adults and children. Patients are selected via a lottery and there is often a waiting list.   [facility] [address], [place]  [phone] [email or web address]   [facility] [address] [place], [place] [phone], Ext. [phone] Second and Fourth Thursday of each month, opens at 6:30 AM; Clinic ends at 9 AM.  Patients are seen on a first-come first-served basis, and a limited number are seen during each clinic.   [facility]  [address] [place]ard Danker [place], [place] [phone]   Eligibility Requirements You must have lived in [place], [place], or [place] counties for at least the last three months.   You cannot be eligible for state or federal sponsored [employer], including [employer], [place], or [employer].   You generally cannot be eligible for healthcare insurance through your employer.  How to apply: Eligibility screenings are held every Tuesday and Wednesday afternoon from 1:00 pm until 4:00 pm. You do not need an appointment for the interview!  [facility] [address] [place], [place]   [place]  [place] Department  [place]  [phone]    Behavioral Health Resources in the Community: Intensive Outpatient Programs Organization         Address  Phone  Notes  [place]. [address], [place], [place] [phone]   [facility] Outpatient [address] [place], [place]   ADS: [facility] Drug [facility] [address] [place], [place]   [place] [address]  [place], [place] or [phone]   Substance Abuse Resources Organization         Address  Phone  Notes  Alcohol and Drug Services  [phone]   [place]  [phone]   The [place]   Chinita Pester  [phone]   Residential & Outpatient Substance Abuse Program  [phone]   Psychological Services Organization         Address  Phone  Notes  [facility] [place]  [place]  [phone]   [place] [address] [place] or [phone]    Mobile Crisis Teams Organization         Address  Phone  Notes  [facility], Mobile Crisis Care Unit  [phone]   [facility]  [address] [place], [place]   [place] [address] [place] [phone]    Self-Help/Support Groups Organization         Address  Phone             Notes  [place]. of [place] - variety of support groups  [place] Call for more information  Narcotics Anonymous (NA), [facility] [address], [employer] [place]  2 meetings at this location   Special educational needs teacher         Address  Phone  Notes  [facility]idential [facility] [place],    [place]  [phone]   [facility]  [address], [place] [address], [place], [place]   [place], [place] [phone] Admissions: 8am-3pm M-F  Incentives Substance [place] [address]    [place], [place] [phone]   [facility] [address] [place], [place], [place]   The [facility] [address]  [place], [place]   [facility] - Intensive Outpatient [address], [place], [place]   [facility] [place].,  [place], [place] [phone] or [phone]   [facility] Treatment Services (RTS) [address] [place], [place] Accepts Medicaid  [facility] [place] [address]  [place] [phone] Substance Abuse/Addiction Treatment   [facility] Organization         Address  Phone  Notes  [facility]  [phone]   Domenic Schwab, PhD [address] Arlis [place], [place]   [phone] or [phone]   [place], [place] [phone]   [facility] [address],  [place], [place] [phone] Insurance/Medicaid/sponsorship through [facility] and Families [address] [place], [place] [phone]  [place] [address]   [place], [place] [phone]    Dr. Adele Schilder  [phone]   [facility] [place] Dept. 1) [address] [place] 2) [place] 3)  [place] [address], [place] [phone]  [phone]   [place] [phone] or [phone] (After Hours)

## 2014-05-12 NOTE — ED Provider Notes (Signed)
CSN: [id number]     Arrival date & time 05/12/14  1125 History   First MD Initiated Contact with Patient 05/12/14 1142     Chief Complaint  Patient presents with  . Foot Pain     (Consider location/radiation/quality/duration/timing/severity/associated sxs/prior Treatment) HPI Craig West is a 49 year old male with past medical history of diabetes, hypertension, asthma, gout who presents the ER complaining of bilateral foot pain. Patient reports pain in his left ankle and left MTP joint which has been gradually hurting worse the past week. Patient reports his pain feels consistent with previous gout flareups, and he reports associated erythema, swelling, painful range of motion. Patient reports on his right foot on the lateral edge there is a callus which has been painful for approximately 18 months. Patient denies any new symptoms with a callus, and reports persistent pain 18 months. Patient denies any numbness, weakness, fever.  Past Medical History  Diagnosis Date  . Diabetes mellitus without complication   . Hypertension   . Asthma    History reviewed. No pertinent past surgical history. History reviewed. No pertinent family history. History  Substance Use Topics  . Smoking status: Current Every Day Smoker    Types: Cigarettes  . Smokeless tobacco: Not on file  . Alcohol Use: No    Review of Systems  Musculoskeletal: Positive for arthralgias.  Neurological: Negative for weakness and numbness.      Allergies  Review of patient's allergies indicates no known allergies.  Home Medications   Prior to Admission medications   Medication Sig Start Date End Date Taking? Authorizing Provider  cyclobenzaprine (FLEXERIL) 10 MG tablet Take 1 tablet (10 mg total) by mouth 3 (three) times daily as needed for muscle spasms. 02/11/14   Mercedes Strupp Camprubi-Soms, PA-C  diphenhydrAMINE (BENADRYL) 25 mg capsule Take 1 capsule (25 mg total) by mouth once. 01/22/14   Noland Fordyce,  PA-C  HYDROcodone-acetaminophen (NORCO) 5-325 MG per tablet Take 1-2 tablets by mouth every 6 (six) hours as needed for severe pain. 02/11/14   Mercedes Strupp Camprubi-Soms, PA-C  HYDROcodone-acetaminophen (NORCO/VICODIN) 5-325 MG per tablet Take 1-2 tablets by mouth every 6 (six) hours as needed for moderate pain or severe pain. 05/12/14   Carrie Mew, PA-C  indomethacin (INDOCIN) 25 MG capsule Take 2 capsules (50 mg total) by mouth 3 (three) times daily as needed. For the first three days, then use 25mg  (1 capsule) for two days. 05/12/14   Carrie Mew, PA-C  lisinopril-hydrochlorothiazide (PRINZIDE,ZESTORETIC) 10-12.5 MG per tablet Take 1 tablet by mouth daily. 01/22/14   Noland Fordyce, PA-C  mupirocin cream (BACTROBAN) 2 % Apply 1 application topically 3 (three) times daily. Apply to scrape on right arm 3 times daily for 7 to 10 days. 01/22/14   Noland Fordyce, PA-C  naproxen (NAPROSYN) 500 MG tablet Take 1 tablet (500 mg total) by mouth 2 (two) times daily as needed for mild pain, moderate pain or headache (TAKE WITH MEALS.). 02/11/14   Mercedes Strupp Camprubi-Soms, PA-C  predniSONE (DELTASONE) 20 MG tablet 3 tabs po day one, then 2 po daily x 4 days 01/22/14   Noland Fordyce, PA-C  predniSONE (DELTASONE) 20 MG tablet Take 1 tablet (20 mg total) by mouth daily. 05/12/14   Carrie Mew, PA-C  traMADol (ULTRAM) 50 MG tablet Take 1 tablet (50 mg total) by mouth every 6 (six) hours as needed. 01/22/14   Nol[place] Fordyce, PA-C   BP 174/115 mmHg  Pulse 80  Temp(Src) 98.4 F (36.9 C) (  Oral)  SpO2 100% Physical Exam  Constitutional: He appears well-developed and well-nourished. No distress.  HENT:  Head: Normocephalic and atraumatic.  Eyes: Conjunctivae are normal. Right eye exhibits no discharge. Left eye exhibits no discharge. No scleral icterus.  Cardiovascular:  Peripheral pulses intact at injured extremity.   Pulmonary/Chest: Effort normal. No respiratory distress.  Musculoskeletal:  Left  foot exam: Mild erythema and warmth noted to the first MTP joint. Mild tenderness in pain with range of motion to left ankle. DP pulse 2+. Capillary refill less than 2 seconds. Distal sensation intact. Patient has 5 out of 5 motor strength at knee, ankle  Neurological: He is alert.  No numbness distal to injury.    Skin: Skin is warm and dry. No rash noted. He is not diaphoretic.  Nursing note and vitals reviewed.   ED Course  Procedures (including critical care time) Labs Review Labs Reviewed - No data to display  Imaging Review No results found.   EKG Interpretation None      MDM   Final diagnoses:  Other secondary chronic gout of left ankle  Gout of big toe    Pt presents with monoarticular pain, swelling and erythema.  Pt is afebrile and stable. Patient denies having any injury or new signs and symptoms. Patient states his pain is consistent with previous pain he has experienced with gout.  Pt without known peptic ulcer disease and not receiving concurrent treatment on warfarin. Pt dc with indomethacin (50 mg PO TID). Discussed that pt should respond to treatment with in 24 hour of begining treatment & likely resolve in 2-3 days. Patient also requesting refills on his maintenance therapy for hypertension and diabetes. I provided resource guide to help patient find primary care provider. Also had social worker speak with patient regarding his use the emergency room to refill maintenance medications, social work or was able to sit patient up with the community wellness clinic to help facilitate this. I discussed return precautions with patient, encouraged him to call or return to ER should he have any questions or concerns.  BP 174/115 mmHg  Pulse 80  Temp(Src) 98.4 F (36.9 C) (Oral)  SpO2 100%  Signed,  Dahlia Bailiff, PA-C 4:20 PM  Patient discussed with Dr. Evelina Bucy, MD   Carrie Mew, PA-C 05/12/14 [place], MD 05/13/14 [phone]

## 2014-05-12 NOTE — ED Notes (Signed)
Pt frustrated that he cannot find PCP for HTN, states that he needs blood pressure medicine. Informed pt that this Rn will speak with case management.

## 2014-05-12 NOTE — ED Notes (Signed)
Pt c/o bilateral foot pain. Pt sts previous surgical incision on lateral right foot sore since procedure 18 months ago. Pt sts gout flair up in Left foot and ankle. Pt noted to have minimal swelling bilaterally.

## 2014-05-12 NOTE — ED Notes (Signed)
Case management at bedside.

## 2014-05-12 NOTE — Progress Notes (Signed)
  CARE MANAGEMENT ED NOTE 05/12/2014  Patient:  Craig West, Craig West   Account Number:  [id number]  Date Initiated:  05/12/2014  Documentation initiated by:  [place]  Subjective/Objective Assessment:   Patient presented to [facility] ED with bilateral foot pain, with past history of HTN, DM, and gout.     Subjective/Objective Assessment Detail:     Action/Plan:   Assistance finding a PCP  Medication assistance   Action/Plan Detail:   Referral to [facility]  Referral to [facility]   Anticipated DC Date:  05/12/2014     Status Recommendation to Physician:   Result of Recommendation:  Agreed  Other ED Services  Consult Working [place]  CM consult    Choice offered to / List presented to:  C-1 Patient          Status of service:  Completed, signed off  ED Comments:   ED Comments Detail:  05/12/14 12:57 W. Roigers RN BSN NCM [phone] ED CM consulted by Danie Binder in [place] concerning patient needing to  establish care with PCP, and medication assistance. Reviewed record, No PCP or Insurance listed, met with patient confirmed information. Patient reports he has not seen a doctor in a couple of years due to not having insurance. Discussed the [facility] and services rendered, he is agreeable with establsihing care at the [facility], and also with with utilizing the [facility]. Patient was informed that he can walk over to the [facility] to establish care and fill his prescription. [facility] pharmacy alerted to expect patient's arrival, Patient verbalized understanding teach back done. Updated Andee Poles and Tonye Becket PA on disposition plan. No further ED CM needs identified.

## 2014-05-16 ENCOUNTER — Inpatient Hospital Stay: Payer: Self-pay

## 2014-05-23 ENCOUNTER — Ambulatory Visit: Payer: Self-pay | Attending: Physician Assistant | Admitting: Physician Assistant

## 2014-05-23 VITALS — BP 168/126 | HR 91 | Temp 99.0°F | Resp 22 | Ht 73.0 in | Wt 232.4 lb

## 2014-05-23 DIAGNOSIS — I10 Essential (primary) hypertension: Secondary | ICD-10-CM | POA: Insufficient documentation

## 2014-05-23 DIAGNOSIS — E119 Type 2 diabetes mellitus without complications: Secondary | ICD-10-CM | POA: Insufficient documentation

## 2014-05-23 DIAGNOSIS — M109 Gout, unspecified: Secondary | ICD-10-CM | POA: Insufficient documentation

## 2014-05-23 DIAGNOSIS — E785 Hyperlipidemia, unspecified: Secondary | ICD-10-CM | POA: Insufficient documentation

## 2014-05-23 LAB — COMPLETE METABOLIC PANEL WITH GFR
ALBUMIN: 3.9 g/dL (ref 3.5–5.2)
ALT: 31 U/L (ref 0–53)
AST: 22 U/L (ref 0–37)
Alkaline Phosphatase: 90 U/L (ref 39–117)
BUN: 26 mg/dL — AB (ref 6–23)
CHLORIDE: 99 meq/L (ref 96–112)
CO2: 30 meq/L (ref 19–32)
Calcium: 9.9 mg/dL (ref 8.4–10.5)
Creat: 1.44 mg/dL — ABNORMAL HIGH (ref 0.50–1.35)
GFR, EST AFRICAN AMERICAN: 65 mL/min
GFR, Est Non African American: 56 mL/min — ABNORMAL LOW
GLUCOSE: 126 mg/dL — AB (ref 70–99)
POTASSIUM: 5.3 meq/L (ref 3.5–5.3)
SODIUM: 141 meq/L (ref 135–145)
TOTAL PROTEIN: 7.4 g/dL (ref 6.0–8.3)
Total Bilirubin: 0.8 mg/dL (ref 0.2–1.2)

## 2014-05-23 LAB — CBC WITH DIFFERENTIAL/PLATELET
BASOS PCT: 0 % (ref 0–1)
Basophils Absolute: 0 10*3/uL (ref 0.0–0.1)
EOS PCT: 1 % (ref 0–5)
Eosinophils Absolute: 0.1 10*3/uL (ref 0.0–0.7)
HCT: 48.4 % (ref 39.0–52.0)
Hemoglobin: 16.3 g/dL (ref 13.0–17.0)
Lymphocytes Relative: 19 % (ref 12–46)
Lymphs Abs: 2.4 10*3/uL (ref 0.7–4.0)
MCH: 29.8 pg (ref 26.0–34.0)
MCHC: 33.7 g/dL (ref 30.0–36.0)
MCV: 88.5 fL (ref 78.0–100.0)
MONOS PCT: 5 % (ref 3–12)
MPV: 10.5 fL (ref 8.6–12.4)
Monocytes Absolute: 0.6 10*3/uL (ref 0.1–1.0)
NEUTROS PCT: 75 % (ref 43–77)
Neutro Abs: 9.6 10*3/uL — ABNORMAL HIGH (ref 1.7–7.7)
Platelets: 233 10*3/uL (ref 150–400)
RBC: 5.47 MIL/uL (ref 4.22–5.81)
RDW: 15 % (ref 11.5–15.5)
WBC: 12.8 10*3/uL — ABNORMAL HIGH (ref 4.0–10.5)

## 2014-05-23 LAB — HEMOGLOBIN A1C
HEMOGLOBIN A1C: 6.1 % — AB (ref <5.7)
Mean Plasma Glucose: 128 mg/dL — ABNORMAL HIGH (ref <117)

## 2014-05-23 MED ORDER — CLONIDINE HCL 0.1 MG PO TABS
0.2000 mg | ORAL_TABLET | Freq: Once | ORAL | Status: AC
  Administered 2014-05-23: 0.2 mg via ORAL

## 2014-05-23 MED ORDER — LISINOPRIL-HYDROCHLOROTHIAZIDE 20-12.5 MG PO TABS: 1.0000 | ORAL_TABLET | Freq: Every day | ORAL | Status: DC

## 2014-05-23 NOTE — Progress Notes (Signed)
Craig West  [id number]  [id number]  DOB - 28-Mar-1966  Chief Complaint  Patient presents with  . Medication Refill  . Hypertension       Subjective:   Craig West is a 49 y.o. male here today for establishment of care. He was in the emergency department on 05/12/2014. At that time he complained of bilateral foot pain. Especially in his left ankle and left MTP. His symptoms had been going on for at least one week. This reminded him of gout. He was treated with Indocin. No labs or imaging was done at that time. His blood pressure was systolic of greater than [id number]. He was told he needed to establish a primary care.  Since discharge the gout flare has improved. He further states that he was imprisoned for several months. Prior to his imprisonment and he knew that he had diabetes, hypertension, asthma, obstructive sleep apnea, and hyperlipidemia. He was on medications while in prison but was not sent home on any medications. He states that in regards to his diabetes and he lost about 50 pounds while in prison and went from 2000 mg daily of metformin to nothing on release.  He has no complaints today. He is just looking to get his medications addressed.    ROS: GEN: denies fever or chills, denies change in weight Skin: denies lesions or rashes HEENT: denies headache, earache, epistaxis, sore throat, or neck pain LUNGS: denies SHOB, dyspnea, PND, orthopnea CV: denies CP or palpitations ABD: denies abd pain, N or V EXT: denies muscle spasms or swelling; no pain in lower ext, no weakness NEURO: denies numbness or tingling, denies sz, stroke or TIA  ALLERGIES: No Known Allergies  PAST MEDICAL HISTORY: Past Medical History  Diagnosis Date  . Diabetes mellitus without complication   . Hypertension   . Asthma     PAST SURGICAL HISTORY: History reviewed. No pertinent past surgical history.  MEDICATIONS AT HOME: Prior to Admission medications   Medication Sig Start Date  End Date Taking? Authorizing Provider  cyclobenzaprine (FLEXERIL) 10 MG tablet Take 1 tablet (10 mg total) by mouth 3 (three) times daily as needed for muscle spasms. 02/11/14   Mercedes Strupp Camprubi-Soms, PA-C  diphenhydrAMINE (BENADRYL) 25 mg capsule Take 1 capsule (25 mg total) by mouth once. 01/22/14   Noland Fordyce, PA-C  HYDROcodone-acetaminophen (NORCO) 5-325 MG per tablet Take 1-2 tablets by mouth every 6 (six) hours as needed for severe pain. 02/11/14   Mercedes Strupp Camprubi-Soms, PA-C  HYDROcodone-acetaminophen (NORCO/VICODIN) 5-325 MG per tablet Take 1-2 tablets by mouth every 6 (six) hours as needed for moderate pain or severe pain. 05/12/14   Carrie Mew, PA-C  indomethacin (INDOCIN) 25 MG capsule Take 2 capsules (50 mg total) by mouth 3 (three) times daily as needed. For the first three days, then use 25mg  (1 capsule) for two days. 05/12/14   Carrie Mew, PA-C  lisinopril-hydrochlorothiazide (PRINZIDE,ZESTORETIC) 10-12.5 MG per tablet Take 1 tablet by mouth daily. 01/22/14   Noland Fordyce, PA-C  mupirocin cream (BACTROBAN) 2 % Apply 1 application topically 3 (three) times daily. Apply to scrape on right arm 3 times daily for 7 to 10 days. 01/22/14   Noland Fordyce, PA-C  naproxen (NAPROSYN) 500 MG tablet Take 1 tablet (500 mg total) by mouth 2 (two) times daily as needed for mild pain, moderate pain or headache (TAKE WITH MEALS.). 02/11/14   Mercedes Strupp Camprubi-Soms, PA-C  predniSONE (DELTASONE) 20 MG tablet 3 tabs po day one, then  2 po daily x 4 days 01/22/14   Noland Fordyce, PA-C  predniSONE (DELTASONE) 20 MG tablet Take 1 tablet (20 mg total) by mouth daily. 05/12/14   Carrie Mew, PA-C  traMADol (ULTRAM) 50 MG tablet Take 1 tablet (50 mg total) by mouth every 6 (six) hours as needed. 01/22/14   Noland Fordyce, PA-C     Objective:   Filed Vitals:   05/23/14 1056  BP: 211/136  Pulse: 91  Temp: 99 F (37.2 C)  TempSrc: Oral  Resp: 22  Height: 6\' 1"  (1.854 m)    Weight: 232 lb 6.4 oz (105.416 kg)  SpO2: 100%    Exam General appearance : Awake, alert, not in any distress. Speech Clear. Not toxic looking HEENT: Atraumatic and Normocephalic, pupils equally reactive to light and accomodation Neck: supple, no JVD. No cervical lymphadenopathy.  Chest:Good air entry bilaterally, no added sounds  CVS: S1 S2 regular, no murmurs.  Abdomen: Bowel sounds present, Non tender and not distended with no gaurding, rigidity or rebound. Extremities: B/L Lower Ext shows no edema, both legs are warm to touch Neurology: Awake alert, and oriented X 3, CN II-XII intact, Non focal Skin:No Rash Wounds:N/A  Data Review Lab Results  Component Value Date   HGBA1C 9.5* 09/19/2009     Assessment & Plan  1. Gouty arthritis, subsided 2. Diabetes mellitus type 2  -Check CBC, CMP, A1c today  -Fingerstick pending  -No medications until I can see where his sugars have been. He states that he has been off medications for the last couple of months and his sugars have been controlled for the most part. 3. Hypertension, inadequately controlled, accelerated  -Clonidine in clinic today  -Prinzide 20/12.5 mg daily  -Smoking cessation    Return in about 2 weeks (around 06/06/2014).  The patient was given clear instructions to go to ER or return to medical center if symptoms don't improve, worsen or new problems develop. The patient verbalized understanding. The patient was told to call to get lab results if they haven't heard anything in the next week.   This note has been created with Surveyor, quantity. Any transcriptional errors are unintentional.    Zettie Pho, PA-C [facility] [place], [place]   05/23/2014, 11:29 AM

## 2014-05-23 NOTE — Progress Notes (Signed)
Pt presents to clinic for follow up after ED visit requesting med refill for BP and other. States he was in prisen and now has no provider. States he has not taken his meds for about 4 months. BP-211/136, denies headache or other neuro symptoms.

## 2014-06-06 ENCOUNTER — Ambulatory Visit: Payer: Self-pay | Admitting: Internal Medicine

## 2014-06-13 ENCOUNTER — Encounter: Payer: Self-pay | Admitting: Internal Medicine

## 2014-06-13 ENCOUNTER — Ambulatory Visit: Payer: Self-pay | Attending: Internal Medicine | Admitting: Internal Medicine

## 2014-06-13 VITALS — BP 133/91 | HR 81 | Temp 98.6°F | Resp 16 | Ht 73.0 in | Wt 234.0 lb

## 2014-06-13 DIAGNOSIS — F17200 Nicotine dependence, unspecified, uncomplicated: Secondary | ICD-10-CM

## 2014-06-13 DIAGNOSIS — Z720 Tobacco use: Secondary | ICD-10-CM

## 2014-06-13 DIAGNOSIS — E119 Type 2 diabetes mellitus without complications: Secondary | ICD-10-CM

## 2014-06-13 DIAGNOSIS — I10 Essential (primary) hypertension: Secondary | ICD-10-CM

## 2014-06-13 LAB — GLUCOSE, POCT (MANUAL RESULT ENTRY): POC Glucose: 124 mg/dl — AB (ref 70–99)

## 2014-06-13 MED ORDER — LISINOPRIL-HYDROCHLOROTHIAZIDE 20-12.5 MG PO TABS: 1.0000 | ORAL_TABLET | Freq: Every day | ORAL | Status: DC

## 2014-06-13 NOTE — Progress Notes (Signed)
Patient ID: Craig West, male   DOB: 28-Feb-1966, 49 y.o.   MRN: [id number]  CC: HTN f/u   HPI: Craig West is a 49 y.o. male here today for a follow up visit.  Patient has past medical history of T2DM, HTN, and tobacco use.  He was last seen here 3 weeks ago in the walk in clinic for HTN.  He was given lisinopril-HCTZ which he has been taking daily.  He reports that he has taken the medication daily but continues to smoke about 7 cigarettes per day.  He does admit to eating frequent fried foods.  He reports that he has not taken diabetes medication in 2 years because he has been able to control it with diet and exercise.  He states that he would like to continue to control it himself without medication use. He denies all complaints today.   Patient has No headache, No chest pain, No abdominal pain - No Nausea, No new weakness tingling or numbness, No Cough - SOB.  No Known Allergies Past Medical History  Diagnosis Date  . Diabetes mellitus without complication   . Hypertension   . Asthma    Current Outpatient Prescriptions on File Prior to Visit  Medication Sig Dispense Refill  . cyclobenzaprine (FLEXERIL) 10 MG tablet Take 1 tablet (10 mg total) by mouth 3 (three) times daily as needed for muscle spasms. (Patient not taking: Reported on 06/13/2014) 15 tablet 0  . diphenhydrAMINE (BENADRYL) 25 mg capsule Take 1 capsule (25 mg total) by mouth once. (Patient not taking: Reported on 06/13/2014) 30 capsule 0  . HYDROcodone-acetaminophen (NORCO) 5-325 MG per tablet Take 1-2 tablets by mouth every 6 (six) hours as needed for severe pain. (Patient not taking: Reported on 06/13/2014) 6 tablet 0  . HYDROcodone-acetaminophen (NORCO/VICODIN) 5-325 MG per tablet Take 1-2 tablets by mouth every 6 (six) hours as needed for moderate pain or severe pain. (Patient not taking: Reported on 06/13/2014) 15 tablet 0  . indomethacin (INDOCIN) 25 MG capsule Take 2 capsules (50 mg total) by mouth 3 (three) times daily as  needed. For the first three days, then use 25mg  (1 capsule) for two days. (Patient not taking: Reported on 06/13/2014) 24 capsule 0  . mupirocin cream (BACTROBAN) 2 % Apply 1 application topically 3 (three) times daily. Apply to scrape on right arm 3 times daily for 7 to 10 days. (Patient not taking: Reported on 06/13/2014) 15 g 0  . naproxen (NAPROSYN) 500 MG tablet Take 1 tablet (500 mg total) by mouth 2 (two) times daily as needed for mild pain, moderate pain or headache (TAKE WITH MEALS.). (Patient not taking: Reported on 06/13/2014) 20 tablet 0  . predniSONE (DELTASONE) 20 MG tablet 3 tabs po day one, then 2 po daily x 4 days (Patient not taking: Reported on 06/13/2014) 11 tablet 0  . predniSONE (DELTASONE) 20 MG tablet Take 1 tablet (20 mg total) by mouth daily. (Patient not taking: Reported on 06/13/2014) 10 tablet 0  . traMADol (ULTRAM) 50 MG tablet Take 1 tablet (50 mg total) by mouth every 6 (six) hours as needed. (Patient not taking: Reported on 06/13/2014) 15 tablet 0   No current facility-administered medications on file prior to visit.   History reviewed. No pertinent family history. History   Social History  . Marital Status: Single    Spouse Name: N/A  . Number of Children: N/A  . Years of Education: N/A   Occupational History  . Not on file.  Social History Main Topics  . Smoking status: Current Every Day Smoker    Types: Cigarettes  . Smokeless tobacco: Not on file  . Alcohol Use: No  . Drug Use: No  . Sexual Activity: Not on file   Other Topics Concern  . Not on file   Social History Narrative    Review of Systems: Constitutional: Negative for fever, chills, diaphoresis, activity change, appetite change and fatigue. HENT: Negative for ear pain, nosebleeds, congestion, facial swelling, rhinorrhea, neck pain, neck stiffness and ear discharge.  Eyes: Negative for pain, discharge, redness, itching and visual disturbance. Respiratory: Negative for cough, choking, chest  tightness, shortness of breath, wheezing and stridor.  Cardiovascular: Negative for chest pain, palpitations and leg swelling. Gastrointestinal: Negative for abdominal distention. Genitourinary: Negative for dysuria, urgency, frequency, hematuria, flank pain, decreased urine volume, difficulty urinating and dyspareunia.  Musculoskeletal: Negative for back pain, joint swelling, arthralgias and gait problem. Neurological: Negative for dizziness, tremors, seizures, syncope, facial asymmetry, speech difficulty, weakness, light-headedness, numbness and headaches.  Hematological: Negative for adenopathy. Does not bruise/bleed easily. Psychiatric/Behavioral: Negative for hallucinations, behavioral problems, confusion, dysphoric mood, decreased concentration and agitation.    Objective:   Filed Vitals:   06/13/14 0936  BP: 133/91  Pulse: 81  Temp: 98.6 F (37 C)  Resp: 16    Physical Exam  Constitutional: He is oriented to person, place, and time.  Neck: No JVD present.  Cardiovascular: Normal rate, regular rhythm and normal heart sounds.   Pulmonary/Chest: Effort normal and breath sounds normal.  Neurological: He is alert and oriented to person, place, and time.  Skin: Skin is warm and dry.     Lab Results  Component Value Date   WBC 12.8* 05/23/2014   HGB 16.3 05/23/2014   HCT 48.4 05/23/2014   MCV 88.5 05/23/2014   PLT 233 05/23/2014   Lab Results  Component Value Date   CREATININE 1.44* 05/23/2014   BUN 26* 05/23/2014   NA 141 05/23/2014   K 5.3 05/23/2014   CL 99 05/23/2014   CO2 30 05/23/2014    Lab Results  Component Value Date   HGBA1C 6.1* 05/23/2014   Lipid Panel  No results found for: CHOL, TRIG, HDL, CHOLHDL, VLDL, LDLCALC     Assessment and plan:   Mayar was seen today for follow-up.  Diagnoses and all orders for this visit:  Type 2 diabetes mellitus without complication Orders: -     Glucose (CBG) Will continue to monitor patient, and let him  control with diet and exercise since his last A1c is 6.1%.  Essential hypertension Orders: -     lisinopril-hydrochlorothiazide (ZESTORETIC) 20-12.5 MG per tablet; Take 1 tablet by mouth daily. Patient blood pressure is stable and may continue on current medication.  Education on diet, exercise, and modifiable risk factors discussed. Will obtain appropriate labs as needed. Will follow up in 3 months.    Return in about 3 months (around 09/13/2014) for DM/HTN.       Chari Manning, NP-C [facility] [phone] 06/13/2014, 10:08 AM

## 2014-06-13 NOTE — Patient Instructions (Signed)
Smoking Cessation Quitting smoking is important to your health and has many advantages. However, it is not always easy to quit since nicotine is a very addictive drug. Oftentimes, people try 3 times or more before being able to quit. This document explains the best ways for you to prepare to quit smoking. Quitting takes hard work and a lot of effort, but you can do it. ADVANTAGES OF QUITTING SMOKING  You will live longer, feel better, and live better.  Your body will feel the impact of quitting smoking almost immediately.  Within 20 minutes, blood pressure decreases. Your pulse returns to its normal level.  After 8 hours, carbon monoxide levels in the blood return to normal. Your oxygen level increases.  After 24 hours, the chance of having a heart attack starts to decrease. Your breath, hair, and body stop smelling like smoke.  After 48 hours, damaged nerve endings begin to recover. Your sense of taste and smell improve.  After 72 hours, the body is virtually free of nicotine. Your bronchial tubes relax and breathing becomes easier.  After 2 to 12 weeks, lungs can hold more air. Exercise becomes easier and circulation improves.  The risk of having a heart attack, stroke, cancer, or lung disease is greatly reduced.  After 1 year, the risk of coronary heart disease is cut in half.  After 5 years, the risk of stroke falls to the same as a nonsmoker.  After 10 years, the risk of lung cancer is cut in half and the risk of other cancers decreases significantly.  After 15 years, the risk of coronary heart disease drops, usually to the level of a nonsmoker.  If you are pregnant, quitting smoking will improve your chances of having a healthy baby.  The people you live with, especially any children, will be healthier.  You will have extra money to spend on things other than cigarettes. QUESTIONS TO THINK ABOUT BEFORE ATTEMPTING TO QUIT You may want to talk about your answers with your  health care provider.  Why do you want to quit?  If you tried to quit in the past, what helped and what did not?  What will be the most difficult situations for you after you quit? How will you plan to handle them?  Who can help you through the tough times? Your family? Friends? A health care provider?  What pleasures do you get from smoking? What ways can you still get pleasure if you quit? Here are some questions to ask your health care provider:  How can you help me to be successful at quitting?  What medicine do you think would be best for me and how should I take it?  What should I do if I need more help?  What is smoking withdrawal like? How can I get information on withdrawal? GET READY  Set a quit date.  Change your environment by getting rid of all cigarettes, ashtrays, matches, and lighters in your home, car, or work. Do not let people smoke in your home.  Review your past attempts to quit. Think about what worked and what did not. GET SUPPORT AND ENCOURAGEMENT You have a better chance of being successful if you have help. You can get support in many ways.  Tell your family, friends, and coworkers that you are going to quit and need their support. Ask them not to smoke around you.  Get individual, group, or telephone counseling and support. Programs are available at local hospitals and health centers. Call   your local health department for information about programs in your area.  Spiritual beliefs and practices may help some smokers quit.  Download a "quit meter" on your computer to keep track of quit statistics, such as how long you have gone without smoking, cigarettes not smoked, and money saved.  Get a self-help book about quitting smoking and staying off tobacco. LEARN NEW SKILLS AND BEHAVIORS  Distract yourself from urges to smoke. Talk to someone, go for a walk, or occupy your time with a task.  Change your normal routine. Take a different route to work.  Drink tea instead of coffee. Eat breakfast in a different place.  Reduce your stress. Take a hot bath, exercise, or read a book.  Plan something enjoyable to do every day. Reward yourself for not smoking.  Explore interactive web-based programs that specialize in helping you quit. GET MEDICINE AND USE IT CORRECTLY Medicines can help you stop smoking and decrease the urge to smoke. Combining medicine with the above behavioral methods and support can greatly increase your chances of successfully quitting smoking.  Nicotine replacement therapy helps deliver nicotine to your body without the negative effects and risks of smoking. Nicotine replacement therapy includes nicotine gum, lozenges, inhalers, nasal sprays, and skin patches. Some may be available over-the-counter and others require a prescription.  Antidepressant medicine helps people abstain from smoking, but how this works is unknown. This medicine is available by prescription.  Nicotinic receptor partial agonist medicine simulates the effect of nicotine in your brain. This medicine is available by prescription. Ask your health care provider for advice about which medicines to use and how to use them based on your health history. Your health care provider will tell you what side effects to look out for if you choose to be on a medicine or therapy. Carefully read the information on the package. Do not use any other product containing nicotine while using a nicotine replacement product.  RELAPSE OR DIFFICULT SITUATIONS Most relapses occur within the first 3 months after quitting. Do not be discouraged if you start smoking again. Remember, most people try several times before finally quitting. You may have symptoms of withdrawal because your body is used to nicotine. You may crave cigarettes, be irritable, feel very hungry, cough often, get headaches, or have difficulty concentrating. The withdrawal symptoms are only temporary. They are strongest  when you first quit, but they will go away within 10-14 days. To reduce the chances of relapse, try to:  Avoid drinking alcohol. Drinking lowers your chances of successfully quitting.  Reduce the amount of caffeine you consume. Once you quit smoking, the amount of caffeine in your body increases and can give you symptoms, such as a rapid heartbeat, sweating, and anxiety.  Avoid smokers because they can make you want to smoke.  Do not let weight gain distract you. Many smokers will gain weight when they quit, usually less than 10 pounds. Eat a healthy diet and stay active. You can always lose the weight gained after you quit.  Find ways to improve your mood other than smoking. FOR MORE INFORMATION  www.smokefree.gov  Document Released: 03/12/2001 Document Revised: 08/02/2013 Document Reviewed: 06/27/2011 ExitCare Patient Information 2015 [employer]. This information is not intended to replace advice given to you by your health care provider. Make sure you discuss any questions you have with your health care provider. DASH Eating Plan DASH stands for "Dietary Approaches to Stop Hypertension." The DASH eating plan is a healthy eating plan that has   been shown to reduce high blood pressure (hypertension). Additional health benefits may include reducing the risk of type 2 diabetes mellitus, heart disease, and stroke. The DASH eating plan may also help with weight loss. WHAT DO I NEED TO KNOW ABOUT THE DASH EATING PLAN? For the DASH eating plan, you will follow these general guidelines:  Choose foods with a percent daily value for sodium of less than 5% (as listed on the food label).  Use salt-free seasonings or herbs instead of table salt or sea salt.  Check with your health care provider or pharmacist before using salt substitutes.  Eat lower-sodium products, often labeled as "lower sodium" or "no salt added."  Eat fresh foods.  Eat more vegetables, fruits, and low-fat dairy  products.  Choose whole grains. Look for the word "whole" as the first word in the ingredient list.  Choose fish and skinless chicken or turkey more often than red meat. Limit fish, poultry, and meat to 6 oz (170 g) each day.  Limit sweets, desserts, sugars, and sugary drinks.  Choose heart-healthy fats.  Limit cheese to 1 oz (28 g) per day.  Eat more home-cooked food and less restaurant, buffet, and fast food.  Limit fried foods.  Cook foods using methods other than frying.  Limit canned vegetables. If you do use them, rinse them well to decrease the sodium.  When eating at a restaurant, ask that your food be prepared with less salt, or no salt if possible. WHAT FOODS CAN I EAT? Seek help from a dietitian for individual calorie needs. Grains Whole grain or whole wheat bread. Brown rice. Whole grain or whole wheat pasta. Quinoa, bulgur, and whole grain cereals. Low-sodium cereals. Corn or whole wheat flour tortillas. Whole grain cornbread. Whole grain crackers. Low-sodium crackers. Vegetables Fresh or frozen vegetables (raw, steamed, roasted, or grilled). Low-sodium or reduced-sodium tomato and vegetable juices. Low-sodium or reduced-sodium tomato sauce and paste. Low-sodium or reduced-sodium canned vegetables.  Fruits All fresh, canned (in natural juice), or frozen fruits. Meat and Other Protein Products Ground beef (85% or leaner), grass-fed beef, or beef trimmed of fat. Skinless chicken or turkey. Ground chicken or turkey. Pork trimmed of fat. All fish and seafood. Eggs. Dried beans, peas, or lentils. Unsalted nuts and seeds. Unsalted canned beans. Dairy Low-fat dairy products, such as skim or 1% milk, 2% or reduced-fat cheeses, low-fat ricotta or cottage cheese, or plain low-fat yogurt. Low-sodium or reduced-sodium cheeses. Fats and Oils Tub margarines without trans fats. Light or reduced-fat mayonnaise and salad dressings (reduced sodium). Avocado. Safflower, olive, or canola  oils. Natural peanut or almond butter. Other Unsalted popcorn and pretzels. The items listed above may not be a complete list of recommended foods or beverages. Contact your dietitian for more options. WHAT FOODS ARE NOT RECOMMENDED? Grains White bread. White pasta. White rice. Refined cornbread. Bagels and croissants. Crackers that contain trans fat. Vegetables Creamed or fried vegetables. Vegetables in a cheese sauce. Regular canned vegetables. Regular canned tomato sauce and paste. Regular tomato and vegetable juices. Fruits Dried fruits. Canned fruit in light or heavy syrup. Fruit juice. Meat and Other Protein Products Fatty cuts of meat. Ribs, chicken wings, bacon, sausage, bologna, salami, chitterlings, fatback, hot dogs, bratwurst, and packaged luncheon meats. Salted nuts and seeds. Canned beans with salt. Dairy Whole or 2% milk, cream, half-and-half, and cream cheese. Whole-fat or sweetened yogurt. Full-fat cheeses or blue cheese. Nondairy creamers and whipped toppings. Processed cheese, cheese spreads, or cheese curds. Condiments Onion and garlic salt,   seasoned salt, table salt, and sea salt. Canned and packaged gravies. Worcestershire sauce. Tartar sauce. Barbecue sauce. Teriyaki sauce. Soy sauce, including reduced sodium. Steak sauce. Fish sauce. Oyster sauce. Cocktail sauce. Horseradish. Ketchup and mustard. Meat flavorings and tenderizers. Bouillon cubes. Hot sauce. Tabasco sauce. Marinades. Taco seasonings. Relishes. Fats and Oils Butter, stick margarine, lard, shortening, ghee, and bacon fat. Coconut, palm kernel, or palm oils. Regular salad dressings. Other Pickles and olives. Salted popcorn and pretzels. The items listed above may not be a complete list of foods and beverages to avoid. Contact your dietitian for more information. WHERE CAN I FIND MORE INFORMATION? National Heart, Lung, and Blood Institute: www.nhlbi.nih.gov/health/health-topics/topics/dash/ Document Released:  03/07/2011 Document Revised: 08/02/2013 Document Reviewed: 01/20/2013 ExitCare Patient Information 2015 [employer]. This information is not intended to replace advice given to you by your health care provider. Make sure you discuss any questions you have with your health care provider.  

## 2014-06-13 NOTE — Addendum Note (Signed)
Addended by: Chari Manning A on: 06/13/2014 10:17 AM   Modules accepted: Orders, Medications

## 2014-06-13 NOTE — Progress Notes (Signed)
Pt is here following up on his HTN and diabetes. Pt has no C.C. Today.

## 2014-06-23 ENCOUNTER — Encounter (HOSPITAL_COMMUNITY): Payer: Self-pay | Admitting: Emergency Medicine

## 2014-06-23 ENCOUNTER — Emergency Department (HOSPITAL_COMMUNITY)
Admission: EM | Admit: 2014-06-23 | Discharge: 2014-06-24 | Disposition: A | Discharge status: Home / Self Care | Payer: Self-pay | Attending: Emergency Medicine | Admitting: Emergency Medicine

## 2014-06-23 DIAGNOSIS — Z79899 Other long term (current) drug therapy: Secondary | ICD-10-CM | POA: Insufficient documentation

## 2014-06-23 DIAGNOSIS — E119 Type 2 diabetes mellitus without complications: Secondary | ICD-10-CM | POA: Insufficient documentation

## 2014-06-23 DIAGNOSIS — M10072 Idiopathic gout, left ankle and foot: Secondary | ICD-10-CM | POA: Insufficient documentation

## 2014-06-23 DIAGNOSIS — J45909 Unspecified asthma, uncomplicated: Secondary | ICD-10-CM | POA: Insufficient documentation

## 2014-06-23 DIAGNOSIS — K088 Other specified disorders of teeth and supporting structures: Secondary | ICD-10-CM | POA: Insufficient documentation

## 2014-06-23 DIAGNOSIS — I10 Essential (primary) hypertension: Secondary | ICD-10-CM | POA: Insufficient documentation

## 2014-06-23 DIAGNOSIS — Z720 Tobacco use: Secondary | ICD-10-CM | POA: Insufficient documentation

## 2014-06-23 DIAGNOSIS — K0889 Other specified disorders of teeth and supporting structures: Secondary | ICD-10-CM

## 2014-06-23 DIAGNOSIS — M109 Gout, unspecified: Secondary | ICD-10-CM

## 2014-06-23 MED ORDER — NAPROXEN 250 MG PO TABS
500.0000 mg | ORAL_TABLET | Freq: Once | ORAL | Status: AC
  Administered 2014-06-24: 500 mg via ORAL
  Filled 2014-06-23: qty 2

## 2014-06-23 NOTE — ED Provider Notes (Signed)
CSN: [id number]     Arrival date & time 06/23/14  2239 History  This chart was scribed for non-physician practitioner, Domenic Moras, PA-C working with Everlene Balls, MD by Judithann Sauger, ED Scribe. The patient was seen in room TR05C/TR05C and the patient's care was started at 11:42 PM    Chief Complaint  Patient presents with  . Gout  . Dental Pain   HPI HPI Comments: Craig West is a 49 y.o. male with a hx of gout who presents to the Emergency Department complaining of a throbbing pain on his left great toe onset 2 days ago. Pain felt similar to prior gouty flair.  Increasing pain with prolonged standing at work.  Denies any specific injury. He reports that sometimes the gout moves between the toes. He denies taking any medication because he is unable to afford the medication. He is here because he can't get his prescription filled at the [facility]. He states that he does not want to miss work and unable to f/u at [employer] during work hrs.    He also reports upper right dental pain onset 2 days ago. He reports that he has been using Oragel with mild relief. No headache, fever, sore throat.  Pain worsening with talking and chewing.     Past Medical History  Diagnosis Date  . Diabetes mellitus without complication   . Hypertension   . Asthma    No past surgical history on file. No family history on file. History  Substance Use Topics  . Smoking status: Current Every Day Smoker    Types: Cigarettes  . Smokeless tobacco: Not on file  . Alcohol Use: No    Review of Systems  Constitutional: Negative for fever.  HENT: Positive for dental problem.   Musculoskeletal: Positive for joint swelling.  Skin: Negative for rash and wound.      Allergies  Review of patient's allergies indicates no known allergies.  Home Medications   Prior to Admission medications   Medication Sig Start Date End Date Taking? Authorizing Provider  indomethacin (INDOCIN) 25 MG capsule Take 2  capsules (50 mg total) by mouth 3 (three) times daily as needed. For the first three days, then use 25mg  (1 capsule) for two days. Patient not taking: Reported on 06/13/2014 05/12/14   Dahlia Bailiff, PA-C  lisinopril-hydrochlorothiazide (ZESTORETIC) 20-12.5 MG per tablet Take 1 tablet by mouth daily. 06/13/14   Lance Bosch, NP   BP 145/94 mmHg  Pulse 102  Temp(Src) 99.1 F (37.3 C) (Oral)  Resp 16  Ht 6\' 1"  (1.854 m)  Wt 235 lb (106.595 kg)  BMI 31.01 kg/m2  SpO2 97% Physical Exam  Constitutional: He is oriented to person, place, and time. He appears well-developed and well-nourished. No distress.  HENT:  Head: Normocephalic and atraumatic.  Tenderness to tooth #5 on palpation.   Eyes: Conjunctivae and EOM are normal.  Neck: Neck supple. No tracheal deviation present.  Cardiovascular: Normal rate.   Pulmonary/Chest: Effort normal. No respiratory distress.  Musculoskeletal: Normal range of motion.  Left foot: tenderness to 1st metatarsal region with warmth and edema. No foreign object noted. Brisk cap refill, sensation intact distally.  Neurological: He is alert and oriented to person, place, and time.  Skin: Skin is warm and dry.  Psychiatric: He has a normal mood and affect. His behavior is normal.  Nursing note and vitals reviewed.   ED Course  Procedures (including critical care time) DIAGNOSTIC STUDIES: Oxygen Saturation is 97% on RA,  adequate by my interpretation.    COORDINATION OF CARE: 11:47 PM- Pt advised of plan for treatment and pt agrees.   L great toe pain likely 2/2 to gout.  Will treat with indomethacin, and steroid. Pt also has dental pain.  Dental referral given, abx prescribed.     Labs Review Labs Reviewed - No data to display  Imaging Review No results found.   EKG Interpretation None      MDM   Final diagnoses:  Acute gout of left foot, unspecified cause  Pain, dental    BP 145/94 mmHg  Pulse 102  Temp(Src) 99.1 F (37.3 C) (Oral)  Resp  16  Ht 6\' 1"  (1.854 m)  Wt 235 lb (106.595 kg)  BMI 31.01 kg/m2  SpO2 97%   I personally performed the services described in this documentation, which was scribed in my presence. The recorded information has been reviewed and is accurate.    Domenic Moras, PA-C 06/24/14 [id number]  Everlene Balls, MD 06/24/14 1310

## 2014-06-23 NOTE — ED Notes (Signed)
Pt c/o gout in left foot-- works on feet all day, cannot miss work-- cannot afford gout medicine--- does [facility]-- also has dental pain.

## 2014-06-24 MED ORDER — INDOMETHACIN 25 MG PO CAPS: 50.0000 mg | ORAL_CAPSULE | Freq: Three times a day (TID) | ORAL | Status: DC | PRN

## 2014-06-24 MED ORDER — PENICILLIN V POTASSIUM 500 MG PO TABS: 500.0000 mg | ORAL_TABLET | Freq: Three times a day (TID) | ORAL | Status: DC

## 2014-06-24 MED ORDER — PREDNISONE 20 MG PO TABS: ORAL_TABLET | ORAL | Status: DC

## 2014-06-24 NOTE — Discharge Instructions (Signed)
Dental Care and Dentist Visits Dental care supports good overall health. Regular dental visits can also help you avoid dental pain, bleeding, infection, and other more serious health problems in the future. It is important to keep the mouth healthy because diseases in the teeth, gums, and other oral tissues can spread to other areas of the body. Some problems, such as diabetes, heart disease, and pre-term labor have been associated with poor oral health.  See your dentist every 6 months. If you experience emergency problems such as a toothache or broken tooth, go to the dentist right away. If you see your dentist regularly, you may catch problems early. It is easier to be treated for problems in the early stages.  WHAT TO EXPECT AT A DENTIST VISIT  Your dentist will look for many common oral health problems and recommend proper treatment. At your regular dental visit, you can expect:  Gentle cleaning of the teeth and gums. This includes scraping and polishing. This helps to remove the sticky substance around the teeth and gums (plaque). Plaque forms in the mouth shortly after eating. Over time, plaque hardens on the teeth as tartar. If tartar is not removed regularly, it can cause problems. Cleaning also helps remove stains.  Periodic X-rays. These pictures of the teeth and supporting bone will help your dentist assess the health of your teeth.  Periodic fluoride treatments. Fluoride is a natural mineral shown to help strengthen teeth. Fluoride treatmentinvolves applying a fluoride gel or varnish to the teeth. It is most commonly done in children.  Examination of the mouth, tongue, jaws, teeth, and gums to look for any oral health problems, such as:  Cavities (dental caries). This is decay on the tooth caused by plaque, sugar, and acid in the mouth. It is best to catch a cavity when it is small.  Inflammation of the gums caused by plaque buildup (gingivitis).  Problems with the mouth or malformed  or misaligned teeth.  Oral cancer or other diseases of the soft tissues or jaws. KEEP YOUR TEETH AND GUMS HEALTHY For healthy teeth and gums, follow these general guidelines as well as your dentist's specific advice:  Have your teeth professionally cleaned at the dentist every 6 months.  Brush twice daily with a fluoride toothpaste.  Floss your teeth daily.  Ask your dentist if you need fluoride supplements, treatments, or fluoride toothpaste.  Eat a healthy diet. Reduce foods and drinks with added sugar.  Avoid smoking. TREATMENT FOR ORAL HEALTH PROBLEMS If you have oral health problems, treatment varies depending on the conditions present in your teeth and gums.  Your caregiver will most likely recommend good oral hygiene at each visit.  For cavities, gingivitis, or other oral health disease, your caregiver will perform a procedure to treat the problem. This is typically done at a separate appointment. Sometimes your caregiver will refer you to another dental specialist for specific tooth problems or for surgery. SEEK IMMEDIATE DENTAL CARE IF:  You have pain, bleeding, or soreness in the gum, tooth, jaw, or mouth area.  A permanent tooth becomes loose or separated from the gum socket.  You experience a blow or injury to the mouth or jaw area. Document Released: 11/28/2010 Document Revised: 06/10/2011 Document Reviewed: 11/28/2010 [facility] Patient Information 2015 [place], [place]. This information is not intended to replace advice given to you by your health care provider. Make sure you discuss any questions you have with your health care provider.  Gout Gout is an inflammatory arthritis caused by  a buildup of uric acid crystals in the joints. Uric acid is a chemical that is normally present in the blood. When the level of uric acid in the blood is too high it can form crystals that deposit in your joints and tissues. This causes joint redness, soreness, and swelling  (inflammation). Repeat attacks are common. Over time, uric acid crystals can form into masses (tophi) near a joint, destroying bone and causing disfigurement. Gout is treatable and often preventable. CAUSES  The disease begins with elevated levels of uric acid in the blood. Uric acid is produced by your body when it breaks down a naturally found substance called purines. Certain foods you eat, such as meats and fish, contain high amounts of purines. Causes of an elevated uric acid level include:  Being passed down from parent to child (heredity).  Diseases that cause increased uric acid production (such as obesity, psoriasis, and certain cancers).  Excessive alcohol use.  Diet, especially diets rich in meat and seafood.  Medicines, including certain cancer-fighting medicines (chemotherapy), water pills (diuretics), and aspirin.  Chronic kidney disease. The kidneys are no longer able to remove uric acid well.  Problems with metabolism. Conditions strongly associated with gout include:  Obesity.  High blood pressure.  High cholesterol.  Diabetes. Not everyone with elevated uric acid levels gets gout. It is not understood why some people get gout and others do not. Surgery, joint injury, and eating too much of certain foods are some of the factors that can lead to gout attacks. SYMPTOMS   An attack of gout comes on quickly. It causes intense pain with redness, swelling, and warmth in a joint.  Fever can occur.  Often, only one joint is involved. Certain joints are more commonly involved:  Base of the big toe.  Knee.  Ankle.  Wrist.  Finger. Without treatment, an attack usually goes away in a few days to weeks. Between attacks, you usually will not have symptoms, which is different from many other forms of arthritis. DIAGNOSIS  Your caregiver will suspect gout based on your symptoms and exam. In some cases, tests may be recommended. The tests may include:  Blood  tests.  Urine tests.  X-rays.  Joint fluid exam. This exam requires a needle to remove fluid from the joint (arthrocentesis). Using a microscope, gout is confirmed when uric acid crystals are seen in the joint fluid. TREATMENT  There are two phases to gout treatment: treating the sudden onset (acute) attack and preventing attacks (prophylaxis).  Treatment of an Acute Attack.  Medicines are used. These include anti-inflammatory medicines or steroid medicines.  An injection of steroid medicine into the affected joint is sometimes necessary.  The painful joint is rested. Movement can worsen the arthritis.  You may use warm or cold treatments on painful joints, depending which works best for you.  Treatment to Prevent Attacks.  If you suffer from frequent gout attacks, your caregiver may advise preventive medicine. These medicines are started after the acute attack subsides. These medicines either help your kidneys eliminate uric acid from your body or decrease your uric acid production. You may need to stay on these medicines for a very long time.  The early phase of treatment with preventive medicine can be associated with an increase in acute gout attacks. For this reason, during the first few months of treatment, your caregiver may also advise you to take medicines usually used for acute gout treatment. Be sure you understand your caregiver's directions. Your caregiver may  make several adjustments to your medicine dose before these medicines are effective.  Discuss dietary treatment with your caregiver or dietitian. Alcohol and drinks high in sugar and fructose and foods such as meat, poultry, and seafood can increase uric acid levels. Your caregiver or dietitian can advise you on drinks and foods that should be limited. HOME CARE INSTRUCTIONS   Do not take aspirin to relieve pain. This raises uric acid levels.  Only take over-the-counter or prescription medicines for pain, discomfort,  or fever as directed by your caregiver.  Rest the joint as much as possible. When in bed, keep sheets and blankets off painful areas.  Keep the affected joint raised (elevated).  Apply warm or cold treatments to painful joints. Use of warm or cold treatments depends on which works best for you.  Use crutches if the painful joint is in your leg.  Drink enough fluids to keep your urine clear or pale yellow. This helps your body get rid of uric acid. Limit alcohol, sugary drinks, and fructose drinks.  Follow your dietary instructions. Pay careful attention to the amount of protein you eat. Your daily diet should emphasize fruits, vegetables, whole grains, and fat-free or low-fat milk products. Discuss the use of coffee, vitamin C, and cherries with your caregiver or dietitian. These may be helpful in lowering uric acid levels.  Maintain a healthy body weight. SEEK MEDICAL CARE IF:   You develop diarrhea, vomiting, or any side effects from medicines.  You do not feel better in 24 hours, or you are getting worse. SEEK IMMEDIATE MEDICAL CARE IF:   Your joint becomes suddenly more tender, and you have chills or a fever. MAKE SURE YOU:   Understand these instructions.  Will watch your condition.  Will get help right away if you are not doing well or get worse. Document Released: 03/15/2000 Document Revised: 08/02/2013 Document Reviewed: 10/30/2011 [facility] Patient Information 2015 [place], [place]. This information is not intended to replace advice given to you by your health care provider. Make sure you discuss any questions you have with your health care provider.

## 2014-09-26 ENCOUNTER — Encounter: Payer: Self-pay | Admitting: Internal Medicine

## 2014-09-26 ENCOUNTER — Ambulatory Visit: Payer: Self-pay | Attending: Internal Medicine | Admitting: Internal Medicine

## 2014-09-26 VITALS — BP 150/100 | HR 80 | Temp 98.4°F | Resp 18 | Wt 243.6 lb

## 2014-09-26 DIAGNOSIS — Z23 Encounter for immunization: Secondary | ICD-10-CM | POA: Insufficient documentation

## 2014-09-26 DIAGNOSIS — E119 Type 2 diabetes mellitus without complications: Secondary | ICD-10-CM | POA: Insufficient documentation

## 2014-09-26 DIAGNOSIS — I10 Essential (primary) hypertension: Secondary | ICD-10-CM | POA: Insufficient documentation

## 2014-09-26 DIAGNOSIS — R0602 Shortness of breath: Secondary | ICD-10-CM | POA: Insufficient documentation

## 2014-09-26 LAB — BASIC METABOLIC PANEL
BUN: 20 mg/dL (ref 6–23)
CALCIUM: 9.6 mg/dL (ref 8.4–10.5)
CO2: 31 mEq/L (ref 19–32)
Chloride: 102 mEq/L (ref 96–112)
Creat: 1.56 mg/dL — ABNORMAL HIGH (ref 0.50–1.35)
Glucose, Bld: 126 mg/dL — ABNORMAL HIGH (ref 70–99)
POTASSIUM: 4.7 meq/L (ref 3.5–5.3)
Sodium: 141 mEq/L (ref 135–145)

## 2014-09-26 LAB — POCT GLYCOSYLATED HEMOGLOBIN (HGB A1C): HEMOGLOBIN A1C: 6

## 2014-09-26 LAB — GLUCOSE, POCT (MANUAL RESULT ENTRY): POC Glucose: 151 mg/dl — AB (ref 70–99)

## 2014-09-26 MED ORDER — LISINOPRIL-HYDROCHLOROTHIAZIDE 20-12.5 MG PO TABS: 1.0000 | ORAL_TABLET | Freq: Every day | ORAL | Status: DC

## 2014-09-26 MED ORDER — ALBUTEROL SULFATE 108 (90 BASE) MCG/ACT IN AEPB: 2.0000 | INHALATION_SPRAY | Freq: Four times a day (QID) | RESPIRATORY_TRACT | Status: DC | PRN

## 2014-09-26 NOTE — Progress Notes (Signed)
  Pt is here for follow up on Diabetes and Hypertension. He states he has not been taking his blood pressure medicine everyday.

## 2014-09-26 NOTE — Patient Instructions (Signed)
DASH Eating Plan °DASH stands for "Dietary Approaches to Stop Hypertension." The DASH eating plan is a healthy eating plan that has been shown to reduce high blood pressure (hypertension). Additional health benefits may include reducing the risk of type 2 diabetes mellitus, heart disease, and stroke. The DASH eating plan may also help with weight loss. °WHAT DO I NEED TO KNOW ABOUT THE DASH EATING PLAN? °For the DASH eating plan, you will follow these general guidelines: °· Choose foods with a percent daily value for sodium of less than 5% (as listed on the food label). °· Use salt-free seasonings or herbs instead of table salt or sea salt. °· Check with your health care provider or pharmacist before using salt substitutes. °· Eat lower-sodium products, often labeled as "lower sodium" or "no salt added." °· Eat fresh foods. °· Eat more vegetables, fruits, and low-fat dairy products. °· Choose whole grains. Look for the word "whole" as the first word in the ingredient list. °· Choose fish and skinless chicken or turkey more often than red meat. Limit fish, poultry, and meat to 6 oz (170 g) each day. °· Limit sweets, desserts, sugars, and sugary drinks. °· Choose heart-healthy fats. °· Limit cheese to 1 oz (28 g) per day. °· Eat more home-cooked food and less restaurant, buffet, and fast food. °· Limit fried foods. °· Cook foods using methods other than frying. °· Limit canned vegetables. If you do use them, rinse them well to decrease the sodium. °· When eating at a restaurant, ask that your food be prepared with less salt, or no salt if possible. °WHAT FOODS CAN I EAT? °Seek help from a dietitian for individual calorie needs. °Grains °Whole grain or whole wheat bread. Brown rice. Whole grain or whole wheat pasta. Quinoa, bulgur, and whole grain cereals. Low-sodium cereals. Corn or whole wheat flour tortillas. Whole grain cornbread. Whole grain crackers. Low-sodium crackers. °Vegetables °Fresh or frozen vegetables  (raw, steamed, roasted, or grilled). Low-sodium or reduced-sodium tomato and vegetable juices. Low-sodium or reduced-sodium tomato sauce and paste. Low-sodium or reduced-sodium canned vegetables.  °Fruits °All fresh, canned (in natural juice), or frozen fruits. °Meat and Other Protein Products °Ground beef (85% or leaner), grass-fed beef, or beef trimmed of fat. Skinless chicken or turkey. Ground chicken or turkey. Pork trimmed of fat. All fish and seafood. Eggs. Dried beans, peas, or lentils. Unsalted nuts and seeds. Unsalted canned beans. °Dairy °Low-fat dairy products, such as skim or 1% milk, 2% or reduced-fat cheeses, low-fat ricotta or cottage cheese, or plain low-fat yogurt. Low-sodium or reduced-sodium cheeses. °Fats and Oils °Tub margarines without trans fats. Light or reduced-fat mayonnaise and salad dressings (reduced sodium). Avocado. Safflower, olive, or canola oils. Natural peanut or almond butter. °Other °Unsalted popcorn and pretzels. °The items listed above may not be a complete list of recommended foods or beverages. Contact your dietitian for more options. °WHAT FOODS ARE NOT RECOMMENDED? °Grains °White bread. White pasta. White rice. Refined cornbread. Bagels and croissants. Crackers that contain trans fat. °Vegetables °Creamed or fried vegetables. Vegetables in a cheese sauce. Regular canned vegetables. Regular canned tomato sauce and paste. Regular tomato and vegetable juices. °Fruits °Dried fruits. Canned fruit in light or heavy syrup. Fruit juice. °Meat and Other Protein Products °Fatty cuts of meat. Ribs, chicken wings, bacon, sausage, bologna, salami, chitterlings, fatback, hot dogs, bratwurst, and packaged luncheon meats. Salted nuts and seeds. Canned beans with salt. °Dairy °Whole or 2% milk, cream, half-and-half, and cream cheese. Whole-fat or sweetened yogurt. Full-fat   cheeses or blue cheese. Nondairy creamers and whipped toppings. Processed cheese, cheese spreads, or cheese  curds. °Condiments °Onion and garlic salt, seasoned salt, table salt, and sea salt. Canned and packaged gravies. Worcestershire sauce. Tartar sauce. Barbecue sauce. Teriyaki sauce. Soy sauce, including reduced sodium. Steak sauce. Fish sauce. Oyster sauce. Cocktail sauce. Horseradish. Ketchup and mustard. Meat flavorings and tenderizers. Bouillon cubes. Hot sauce. Tabasco sauce. Marinades. Taco seasonings. Relishes. °Fats and Oils °Butter, stick margarine, lard, shortening, ghee, and bacon fat. Coconut, palm kernel, or palm oils. Regular salad dressings. °Other °Pickles and olives. Salted popcorn and pretzels. °The items listed above may not be a complete list of foods and beverages to avoid. Contact your dietitian for more information. °WHERE CAN I FIND MORE INFORMATION? °National [employer], [employer], [employer]: www.nhlbi.nih.gov/health/health-topics/topics/dash/ °Document Released: 03/07/2011 Document Revised: 08/02/2013 Document Reviewed: 01/20/2013 °[employer]® Patient Information ©2015 [employer]. This information is not intended to replace advice given to you by your health care provider. Make sure you discuss any questions you have with your health care provider. ° °

## 2014-09-26 NOTE — Progress Notes (Signed)
Patient ID: Craig West, male   DOB: 30-Jul-1965, 49 y.o.   MRN: [id number] Subjective:  Craig West is a 49 y.o. male with hypertension.  Patient reports that he takes his BP medication every day but he missed today because he was rushing. He does not check pressures at home. He currently manages his diabetes with diet and exercise alone.   Current Outpatient Prescriptions  Medication Sig Dispense Refill  . indomethacin (INDOCIN) 25 MG capsule Take 2 capsules (50 mg total) by mouth 3 (three) times daily as needed. For the first three days, then use 25mg  (1 capsule) for two days. (Patient not taking: Reported on 09/26/2014) 24 capsule 0  . lisinopril-hydrochlorothiazide (ZESTORETIC) 20-12.5 MG per tablet Take 1 tablet by mouth daily. (Patient not taking: Reported on 09/26/2014) 30 tablet 3   No current facility-administered medications for this visit.    Hypertension ROS: taking medications as instructed, no medication side effects noted, no TIA's, no chest pain on exertion, no dyspnea on exertion and no swelling of ankles.  New concerns: Reports that he was on a asthma inhaler in the past and noticed that now since it is getting hot he is having more difficulty with SOB with exertion.   Objective:  BP 150/100 mmHg  Pulse 80  Temp(Src) 98.4 F (36.9 C) (Oral)  Resp 18  Wt 243 lb 9.6 oz (110.496 kg)  SpO2 95%  Appearance alert, well appearing, and in no distress and overweight. General exam BP noted to be mildly elevated today in office, S1, S2 normal, no gallop, no murmur, chest clear, no JVD, no HSM, no edema.  Lab review: orders written for new lab studies as appropriate; see orders.   Assessment:   Hypertension control uncertain.   Pascal was seen today for hypertension and diabetes.  Diagnoses and all orders for this visit:  Essential hypertension Orders: -     lisinopril-hydrochlorothiazide (ZESTORETIC) 20-12.5 MG per tablet; Take 1 tablet by mouth daily. -     Basic Metabolic  Panel Uncertain of control because patient did not take BP medication this morning   Type 2 diabetes mellitus without complication Orders: -     Glucose (CBG) -     HgB A1c -     Microalbumin, urine Well controlled with diet and exercise alone. No medication needed at this point  Need for prophylactic vaccination against Streptococcus pneumoniae (pneumococcus) Orders: -     Pneumococcal polysaccharide vaccine 23-valent greater than or equal to 2yo subcutaneous/IM  SOB (shortness of breath) on exertion Orders: -     Albuterol Sulfate (PROAIR RESPICLICK) 123XX123 (90 BASE) MCG/ACT AEPB; Inhale 2 puffs into the lungs every 6 (six) hours as needed. Explained signs and symptoms that should warrant immediate attention.  Patient verbalized understanding with teach back used.  .Return in about 3 months (around 12/27/2014) for DM/HTN and 2 weeks RN-BP Check.  Craig Manning, NP 09/26/2014 10:35 PM

## 2014-09-27 LAB — MICROALBUMIN, URINE: Microalb, Ur: 1.3 mg/dL (ref <2.0)

## 2014-09-30 NOTE — Progress Notes (Signed)
No answer

## 2014-10-04 ENCOUNTER — Telehealth: Payer: Self-pay

## 2014-10-04 DIAGNOSIS — I10 Essential (primary) hypertension: Secondary | ICD-10-CM

## 2014-10-04 MED ORDER — AMLODIPINE BESYLATE 10 MG PO TABS: 10.0000 mg | ORAL_TABLET | Freq: Every day | ORAL | Status: DC

## 2014-10-04 NOTE — Telephone Encounter (Signed)
Nurse called patient, patient verified date of birth. Patient aware of kidney function rising in past few months. Patient aware of importance of keeping BP under control to prevent further damage. Nurse advised patient to stop taking indomethacin and do not take any other NSAID. Nurse looked up list and instructed patient to stop taking Advil, ibuprofen, indomethacin, etc. Patient aware of recommendations to stop taking lisinopril-HCTZ and start taking amlodipine 10mg . Prescription sent to pharmacy. Patient has appointment to recheck BP 10/26/14. Patient aware of need to recheck levels in 2 months. Patient voices understanding and has no further questions at this time.

## 2014-10-04 NOTE — Telephone Encounter (Signed)
-----   Message from Cindie Crumbly, [place] sent at 10/04/2014 10:52 AM EDT -----    ----- Message -----    From: Lance Bosch, NP    Sent: 09/27/2014   6:00 PM      To: Cindie Crumbly, RMA  Kidney function is creeping up over past few months. It is going to be very important for him to keep his BP under control to prevent further damage. No NSAID's at all, could cause more damage. Please go over the different types of NSAID's for patient. Make sure he is not taking the indomethicin in his med list as well. Also please switch his BP medication to Amlodipine 10 mg daily and have him come back in 2 weeks for a recheck. So stop lisinopril-HCTZ for now. I would liek to recheck his levels in 2 months.

## 2014-10-20 ENCOUNTER — Telehealth: Payer: Self-pay | Admitting: Internal Medicine

## 2014-10-20 NOTE — Telephone Encounter (Signed)
Patient called to request a medication for his Gout, please f/u with pt.

## 2014-10-26 ENCOUNTER — Other Ambulatory Visit: Payer: Self-pay | Admitting: Internal Medicine

## 2014-10-26 DIAGNOSIS — M109 Gout, unspecified: Secondary | ICD-10-CM

## 2014-10-26 MED ORDER — PREDNISONE 10 MG PO TABS: 10.0000 mg | ORAL_TABLET | Freq: Every day | ORAL | Status: DC

## 2014-10-26 MED ORDER — TRAMADOL HCL 50 MG PO TABS
50.0000 mg | ORAL_TABLET | Freq: Three times a day (TID) | ORAL | Status: DC | PRN
Start: 2014-10-26 — End: 2015-02-14

## 2014-10-26 NOTE — Telephone Encounter (Signed)
Patient called to request a medication for his Gout, please f/u with pt.

## 2014-10-26 NOTE — Telephone Encounter (Signed)
Patient called requesting to speak to nurse regarding status of medication for gout, patient is upset because request was summited 07/21 and has not been addressed, patient states he is in a lot of pain. Please f/u with patient

## 2014-11-01 ENCOUNTER — Encounter: Payer: Self-pay | Admitting: Pharmacist

## 2014-11-15 ENCOUNTER — Ambulatory Visit: Payer: Self-pay | Attending: Internal Medicine | Admitting: Pharmacist

## 2014-11-15 VITALS — BP 142/97 | HR 83

## 2014-11-15 DIAGNOSIS — Z720 Tobacco use: Secondary | ICD-10-CM | POA: Insufficient documentation

## 2014-11-15 DIAGNOSIS — I10 Essential (primary) hypertension: Secondary | ICD-10-CM | POA: Insufficient documentation

## 2014-11-15 NOTE — Progress Notes (Signed)
Patient arrives in poor spirits and is in a rush to go to work. He presents to the clinic for hypertension evaluation.    Patient reports adherence with medications.  Current BP Medications include:  Amlodipine 10 mg daily  Antihypertensives tried in the past include: Lisinopril/HCTZ (d/c due to renal function on 09/2014)   O:   Last 3 Office BP readings: BP Readings from Last 3 Encounters:  11/15/14 142/97  09/26/14 150/100  06/23/14 145/94    BMET    Component Value Date/Time   NA 141 09/26/2014 1741   K 4.7 09/26/2014 1741   CL 102 09/26/2014 1741   CO2 31 09/26/2014 1741   GLUCOSE 126* 09/26/2014 1741   BUN 20 09/26/2014 1741   CREATININE 1.56* 09/26/2014 1741   CREATININE 1.40 09/19/2009 2251   CALCIUM 9.6 09/26/2014 1741   GFRNONAA 56* 05/23/2014 1129   GFRNONAA >60 07/12/2008 1023   GFRAA 65 05/23/2014 1129   GFRAA  07/12/2008 1023    >60        The eGFR has been calculated using the MDRD equation. This calculation has not been validated in all clinical situations. eGFR's persistently <60 mL/min signify possible Chronic Kidney Disease.    A/P:  History of hypertension which is currently uncontrolled on current medications.  Pt states adherence to medications.  Will f/u in 2 weeks for BMP with Gildardo Griffes, NP and assess the need for additional antihypertensive medications.    Results reviewed and written information provided.   F/U Clinic Visit with Gildardo Griffes in 2 weeks .  Total time in face-to-face counseling 10 minutes.  Patient seen with Bennye Alm, PharmD, Pharmacy Resident.

## 2014-11-30 ENCOUNTER — Encounter: Payer: Self-pay | Admitting: Internal Medicine

## 2014-11-30 ENCOUNTER — Ambulatory Visit: Payer: Self-pay | Attending: Internal Medicine | Admitting: Internal Medicine

## 2014-11-30 VITALS — BP 142/96 | HR 70 | Temp 98.8°F | Resp 16 | Ht 73.0 in | Wt 255.0 lb

## 2014-11-30 DIAGNOSIS — I10 Essential (primary) hypertension: Secondary | ICD-10-CM

## 2014-11-30 DIAGNOSIS — IMO0001 Reserved for inherently not codable concepts without codable children: Secondary | ICD-10-CM

## 2014-11-30 DIAGNOSIS — R030 Elevated blood-pressure reading, without diagnosis of hypertension: Secondary | ICD-10-CM

## 2014-11-30 MED ORDER — CLONIDINE HCL 0.1 MG PO TABS
0.1000 mg | ORAL_TABLET | Freq: Once | ORAL | Status: AC
  Administered 2014-11-30: 0.1 mg via ORAL

## 2014-11-30 MED ORDER — HYDRALAZINE HCL 10 MG PO TABS: 10.0000 mg | ORAL_TABLET | Freq: Three times a day (TID) | ORAL | Status: DC

## 2014-11-30 NOTE — Progress Notes (Signed)
Patient ID: Craig West, male   DOB: Aug 25, 1965, 49 y.o.   MRN: [id number]   Subjective:  Craig West is a 49 y.o. male with hypertension.  Patient reports that he took his blood pressure medication 2 hours ago. He is under stress with his family members and has not been exercising. He endorses a diet high in sodium.  Current Outpatient Prescriptions  Medication Sig Dispense Refill  . Albuterol Sulfate (PROAIR RESPICLICK) 123XX123 (90 BASE) MCG/ACT AEPB Inhale 2 puffs into the lungs every 6 (six) hours as needed. 1 each 2  . amLODipine (NORVASC) 10 MG tablet Take 1 tablet (10 mg total) by mouth daily. 30 tablet 3  . traMADol (ULTRAM) 50 MG tablet Take 1 tablet (50 mg total) by mouth every 8 (eight) hours as needed. 20 tablet 0  . hydrALAZINE (APRESOLINE) 10 MG tablet Take 1 tablet (10 mg total) by mouth 3 (three) times daily. 90 tablet 1  . indomethacin (INDOCIN) 25 MG capsule Take 2 capsules (50 mg total) by mouth 3 (three) times daily as needed. For the first three days, then use 25mg  (1 capsule) for two days. (Patient not taking: Reported on 09/26/2014) 24 capsule 0   No current facility-administered medications for this visit.    Hypertension ROS: taking medications as instructed, no medication side effects noted, no TIA's, no chest pain on exertion, no dyspnea on exertion, no swelling of ankles and no palpitations.    Objective:  BP 142/96 mmHg  Pulse 70  Temp(Src) 98.8 F (37.1 C)  Resp 16  Ht 6\' 1"  (1.854 m)  Wt 255 lb (115.667 kg)  BMI 33.65 kg/m2  SpO2 100%  Appearance alert, well appearing, and in no distress, oriented to person, place, and time and overweight. General exam BP noted to be well controlled today in office, S1, S2 normal, no gallop, no murmur, chest clear, no JVD, no HSM, no edema.  Lab review: labs are reviewed, up to date and normal.   Assessment:   Hypertension poorly controlled, needs improvement, needs to quit smoking and needs to follow diet more regularly.   Elevated BP: 0.1 mg Clonidine given in office Tobacco abuse: Smoking cessation discussed for 3 minutes, patient is not willing to quit at this time. Will continue to assess on each visit. Discussed increased risk for diseases such as cancer, heart disease, and stroke.   Plan:  Reviewed diet, exercise and weight control. Recommended sodium restriction. Very strongly urged to quit smoking to reduce cardiovascular risk. Copy of written low fat low cholesterol diet provided and reviewed. The following changes are to be made: continue amlodipine and add Hydralazine 10 mg TID. Marland Kitchen If pressure remains elevated above 140/90 his hydralazine will be increased to 25 mg TID at nurse visit in 2 weeks.    Return in about 2 weeks (around 12/14/2014) for Nurse Visit-BP check and 3 mo PCP HTN.     Lance Bosch, NP 11/30/2014 11:22 AM

## 2014-11-30 NOTE — Progress Notes (Signed)
Patient here for follow up on his HTN Patient presents in office with elevated blood pressure Patient did state he took his medication at 8 am today

## 2014-11-30 NOTE — Patient Instructions (Signed)
DASH Eating Plan DASH stands for "Dietary Approaches to Stop Hypertension." The DASH eating plan is a healthy eating plan that has been shown to reduce high blood pressure (hypertension). Additional health benefits may include reducing the risk of type 2 diabetes mellitus, heart disease, and stroke. The DASH eating plan may also help with weight loss. WHAT DO I NEED TO KNOW ABOUT THE DASH EATING PLAN? For the DASH eating plan, you will follow these general guidelines:  Choose foods with a percent daily value for sodium of less than 5% (as listed on the food label).  Use salt-free seasonings or herbs instead of table salt or sea salt.  Check with your health care provider or pharmacist before using salt substitutes.  Eat lower-sodium products, often labeled as "lower sodium" or "no salt added."  Eat fresh foods.  Eat more vegetables, fruits, and low-fat dairy products.  Choose whole grains. Look for the word "whole" as the first word in the ingredient list.  Choose fish and skinless chicken or turkey more often than red meat. Limit fish, poultry, and meat to 6 oz (170 g) each day.  Limit sweets, desserts, sugars, and sugary drinks.  Choose heart-healthy fats.  Limit cheese to 1 oz (28 g) per day.  Eat more home-cooked food and less restaurant, buffet, and fast food.  Limit fried foods.  Cook foods using methods other than frying.  Limit canned vegetables. If you do use them, rinse them well to decrease the sodium.  When eating at a restaurant, ask that your food be prepared with less salt, or no salt if possible. WHAT FOODS CAN I EAT? Seek help from a dietitian for individual calorie needs. Grains Whole grain or whole wheat bread. Brown rice. Whole grain or whole wheat pasta. Quinoa, bulgur, and whole grain cereals. Low-sodium cereals. Corn or whole wheat flour tortillas. Whole grain cornbread. Whole grain crackers. Low-sodium crackers. Vegetables Fresh or frozen vegetables  (raw, steamed, roasted, or grilled). Low-sodium or reduced-sodium tomato and vegetable juices. Low-sodium or reduced-sodium tomato sauce and paste. Low-sodium or reduced-sodium canned vegetables.  Fruits All fresh, canned (in natural juice), or frozen fruits. Meat and Other Protein Products Ground beef (85% or leaner), grass-fed beef, or beef trimmed of fat. Skinless chicken or turkey. Ground chicken or turkey. Pork trimmed of fat. All fish and seafood. Eggs. Dried beans, peas, or lentils. Unsalted nuts and seeds. Unsalted canned beans. Dairy Low-fat dairy products, such as skim or 1% milk, 2% or reduced-fat cheeses, low-fat ricotta or cottage cheese, or plain low-fat yogurt. Low-sodium or reduced-sodium cheeses. Fats and Oils Tub margarines without trans fats. Light or reduced-fat mayonnaise and salad dressings (reduced sodium). Avocado. Safflower, olive, or canola oils. Natural peanut or almond butter. Other Unsalted popcorn and pretzels. The items listed above may not be a complete list of recommended foods or beverages. Contact your dietitian for more options. WHAT FOODS ARE NOT RECOMMENDED? Grains White bread. White pasta. White rice. Refined cornbread. Bagels and croissants. Crackers that contain trans fat. Vegetables Creamed or fried vegetables. Vegetables in a cheese sauce. Regular canned vegetables. Regular canned tomato sauce and paste. Regular tomato and vegetable juices. Fruits Dried fruits. Canned fruit in light or heavy syrup. Fruit juice. Meat and Other Protein Products Fatty cuts of meat. Ribs, chicken wings, bacon, sausage, bologna, salami, chitterlings, fatback, hot dogs, bratwurst, and packaged luncheon meats. Salted nuts and seeds. Canned beans with salt. Dairy Whole or 2% milk, cream, half-and-half, and cream cheese. Whole-fat or sweetened yogurt. Full-fat   cheeses or blue cheese. Nondairy creamers and whipped toppings. Processed cheese, cheese spreads, or cheese  curds. Condiments Onion and garlic salt, seasoned salt, table salt, and sea salt. Canned and packaged gravies. Worcestershire sauce. Tartar sauce. Barbecue sauce. Teriyaki sauce. Soy sauce, including reduced sodium. Steak sauce. Fish sauce. Oyster sauce. Cocktail sauce. Horseradish. Ketchup and mustard. Meat flavorings and tenderizers. Bouillon cubes. Hot sauce. Tabasco sauce. Marinades. Taco seasonings. Relishes. Fats and Oils Butter, stick margarine, lard, shortening, ghee, and bacon fat. Coconut, palm kernel, or palm oils. Regular salad dressings. Other Pickles and olives. Salted popcorn and pretzels. The items listed above may not be a complete list of foods and beverages to avoid. Contact your dietitian for more information. WHERE CAN I FIND MORE INFORMATION? National Heart, Lung, and Blood Institute: www.nhlbi.nih.gov/health/health-topics/topics/dash/ Document Released: 03/07/2011 Document Revised: 08/02/2013 Document Reviewed: 01/20/2013 ExitCare Patient Information 2015 [employer]. This information is not intended to replace advice given to you by your health care provider. Make sure you discuss any questions you have with your health care provider. Exercise to Lose Weight Exercise and a healthy diet may help you lose weight. Your doctor may suggest specific exercises. EXERCISE IDEAS AND TIPS  Choose low-cost things you enjoy doing, such as walking, bicycling, or exercising to workout videos.  Take stairs instead of the elevator.  Walk during your lunch break.  Park your car further away from work or school.  Go to a gym or an exercise class.  Start with 5 to 10 minutes of exercise each day. Build up to 30 minutes of exercise 4 to 6 days a week.  Wear shoes with good support and comfortable clothes.  Stretch before and after working out.  Work out until you breathe harder and your heart beats faster.  Drink extra water when you exercise.  Do not do so much that you  hurt yourself, feel dizzy, or get very short of breath. Exercises that burn about 150 calories:  Running 1  miles in 15 minutes.  Playing volleyball for 45 to 60 minutes.  Washing and waxing a car for 45 to 60 minutes.  Playing touch football for 45 minutes.  Walking 1  miles in 35 minutes.  Pushing a stroller 1  miles in 30 minutes.  Playing basketball for 30 minutes.  Raking leaves for 30 minutes.  Bicycling 5 miles in 30 minutes.  Walking 2 miles in 30 minutes.  Dancing for 30 minutes.  Shoveling snow for 15 minutes.  Swimming laps for 20 minutes.  Walking up stairs for 15 minutes.  Bicycling 4 miles in 15 minutes.  Gardening for 30 to 45 minutes.  Jumping rope for 15 minutes.  Washing windows or floors for 45 to 60 minutes. Document Released: 04/20/2010 Document Revised: 06/10/2011 Document Reviewed: 04/20/2010 ExitCare Patient Information 2015 [employer]. This information is not intended to replace advice given to you by your health care provider. Make sure you discuss any questions you have with your health care provider.  

## 2014-12-15 ENCOUNTER — Ambulatory Visit: Payer: Self-pay | Attending: Internal Medicine | Admitting: Pharmacist

## 2014-12-15 VITALS — BP 132/86 | Wt 257.6 lb

## 2014-12-15 DIAGNOSIS — E119 Type 2 diabetes mellitus without complications: Secondary | ICD-10-CM | POA: Insufficient documentation

## 2014-12-15 DIAGNOSIS — I10 Essential (primary) hypertension: Secondary | ICD-10-CM | POA: Insufficient documentation

## 2014-12-15 LAB — POCT CBG (FASTING - GLUCOSE)-MANUAL ENTRY: Glucose Fasting, POC: 126 mg/dL — AB (ref 70–99)

## 2014-12-15 NOTE — Progress Notes (Signed)
S:    Patient arrives in good spirits.  He presents to the clinic for hypertension evaluation.   Patient reports adherence with medications.  Current BP Medications include:  Amlodipine 10 mg daily and hydralazine 10 mg TID (patient reports missing the afternoon dose a few times)  Patient reports that he cannot complete his [employer]. He was incarcerated last year and therefore has none of the documentation for lack of job or anything. He also cannot receive food stamps. He is very frustrated because he is trying to turn his life around but keeps getting sent bills for health care.  Patient would also like his blood glucose checked. He feels like he has been urinating more frequently and knows that this is a symptom of elevated blood glucose.    O:   Last 3 Office BP readings: BP Readings from Last 3 Encounters:  12/15/14 132/86  11/30/14 142/96  11/15/14 142/97    BMET    Component Value Date/Time   NA 141 09/26/2014 1741   K 4.7 09/26/2014 1741   CL 102 09/26/2014 1741   CO2 31 09/26/2014 1741   GLUCOSE 126* 09/26/2014 1741   BUN 20 09/26/2014 1741   CREATININE 1.56* 09/26/2014 1741   CREATININE 1.40 09/19/2009 2251   CALCIUM 9.6 09/26/2014 1741   GFRNONAA 56* 05/23/2014 1129   GFRNONAA >60 07/12/2008 1023   GFRAA 65 05/23/2014 1129   GFRAA  07/12/2008 1023    >60        The eGFR has been calculated using the MDRD equation. This calculation has not been validated in all clinical situations. eGFR's persistently <60 mL/min signify possible Chronic Kidney Disease.    A/P:  Hypertension: currently is controlled on current medications. No recommendations to any changes in medications at this time. Recommended that patient check blood pressure at the store periodically and to report anything >140/90.   Diabetes: fasting blood glucose slightly elevated at 126 mg/dL but patient at goal with last A1c of 6.0. No recommendations for any changes currently and  patient can follow up with Chari Manning, NP.   Financial aid: provided patient with resources from [employer] so that patient can complete financial assistance application. Recommended that patient use financial aid walk in hours and schedule an appointment if he needs further assistance.       Results reviewed and written information provided.   F/U Clinic Visit with Chari Manning and follow up with me as needed. Total time in face-to-face counseling 20 minutes.

## 2014-12-15 NOTE — Patient Instructions (Signed)
Thanks for coming to see me today!  Keep taking the amlodipine and hydralazine for your blood pressure  Call those numbers and get the documents you need to finish the financial application  If you notice your blood pressure is >140/90, come back and see me. Otherwise, next visit with Craig West in [place]

## 2014-12-19 ENCOUNTER — Telehealth: Payer: Self-pay | Admitting: Internal Medicine

## 2014-12-19 MED ORDER — AMLODIPINE BESYLATE 10 MG PO TABS: 10.0000 mg | ORAL_TABLET | Freq: Every day | ORAL | Status: DC

## 2014-12-19 NOTE — Telephone Encounter (Signed)
Patient called wanting a med refill on his blood pressure medication. Please f/u

## 2014-12-19 NOTE — Telephone Encounter (Signed)
Spoke with patient and he is aware his prescription has been refilled And to pick it up at the pharmacy

## 2014-12-21 NOTE — Telephone Encounter (Signed)
Patient has seen PCP since this request.

## 2015-02-14 ENCOUNTER — Telehealth: Payer: Self-pay

## 2015-02-14 ENCOUNTER — Other Ambulatory Visit: Payer: Self-pay

## 2015-02-14 ENCOUNTER — Other Ambulatory Visit: Payer: Self-pay | Admitting: Internal Medicine

## 2015-02-14 ENCOUNTER — Telehealth: Payer: Self-pay | Admitting: Internal Medicine

## 2015-02-14 DIAGNOSIS — M109 Gout, unspecified: Secondary | ICD-10-CM

## 2015-02-14 MED ORDER — TRAMADOL HCL 50 MG PO TABS: 50.0000 mg | ORAL_TABLET | Freq: Three times a day (TID) | ORAL | Status: DC | PRN

## 2015-02-14 MED ORDER — PREDNISONE 20 MG PO TABS: 20.0000 mg | ORAL_TABLET | Freq: Every day | ORAL | Status: DC

## 2015-02-14 NOTE — Progress Notes (Unsigned)
Patient called requesting a RX for his gout Per provider prednisone 20mg  X 5 days and tramadol 50mg  #20 Given to patient Patient understands that he has to make an appt. To follow up to be Evaluated and blood work to be placed on medication to prevent future flair up with his gout

## 2015-02-14 NOTE — Telephone Encounter (Signed)
Can he get in for a appointment soon? You can give him 20 Tramadol to hold over until he gets a appointment. I may have a opening Wednesday or Thursday

## 2015-02-14 NOTE — Telephone Encounter (Signed)
Patient called stating that he is having a gout flare up and would like something to treat. Please f/u with pt.

## 2015-02-14 NOTE — Telephone Encounter (Signed)
Returned patient phone call Patient states he is having a gout flair up and would  Like something prescribed He was on indocin but that was stopped in June due to his labs Please advise

## 2015-02-15 ENCOUNTER — Telehealth: Payer: Self-pay

## 2015-02-15 DIAGNOSIS — M1000 Idiopathic gout, unspecified site: Secondary | ICD-10-CM

## 2015-02-15 NOTE — Telephone Encounter (Signed)
Returned patient phone call Patient stated that tramadol for his gout is not helping and is requesting something stronger Patient was informed that this office does not prescribe narcotics Patient asked if we could write the RX and he would fill it elsewhere Patient was informed that that is not how this office runs Patient was informed we can refer him to pain management and he agreed Referral placed in epic

## 2015-02-15 NOTE — Telephone Encounter (Signed)
Patient is stating that he came in for gout pain and was prescribed tramadol but is stating that it is not working and is wondering if he needs to up his does or if he can be prescribed something stronger. Please follow up with patient. Thank you.

## 2015-02-21 ENCOUNTER — Other Ambulatory Visit: Payer: Self-pay

## 2015-02-21 ENCOUNTER — Emergency Department (INDEPENDENT_AMBULATORY_CARE_PROVIDER_SITE_OTHER)
Admission: EM | Admit: 2015-02-21 | Discharge: 2015-02-21 | Disposition: A | Discharge status: Home / Self Care | Payer: Self-pay | Source: Home / Self Care | Attending: Emergency Medicine | Admitting: Emergency Medicine

## 2015-02-21 ENCOUNTER — Encounter (HOSPITAL_COMMUNITY): Payer: Self-pay | Admitting: *Deleted

## 2015-02-21 DIAGNOSIS — M10071 Idiopathic gout, right ankle and foot: Secondary | ICD-10-CM

## 2015-02-21 DIAGNOSIS — I10 Essential (primary) hypertension: Secondary | ICD-10-CM

## 2015-02-21 MED ORDER — AMLODIPINE BESYLATE 10 MG PO TABS: 10.0000 mg | ORAL_TABLET | Freq: Every day | ORAL | Status: DC

## 2015-02-21 MED ORDER — HYDRALAZINE HCL 10 MG PO TABS: 10.0000 mg | ORAL_TABLET | Freq: Three times a day (TID) | ORAL | Status: DC

## 2015-02-21 MED ORDER — HYDROCODONE-ACETAMINOPHEN 5-325 MG PO TABS: 1.0000 | ORAL_TABLET | Freq: Four times a day (QID) | ORAL | Status: DC | PRN

## 2015-02-21 MED ORDER — COLCHICINE 0.6 MG PO TABS: ORAL_TABLET | ORAL | Status: DC

## 2015-02-21 NOTE — Telephone Encounter (Signed)
Pt. Called requesting a med refill for his gout. Pt. Stated she was prescribed a med last week and the medication is not working, his foot is still swollen and is in pain. Please f/u with pt.

## 2015-02-21 NOTE — Telephone Encounter (Signed)
Patient called nurse, patient verified date of birth. Patient explaining medications prescribed for gout flare up did not work. Patient has appointment on 03/01/15 to see Mateo Flow. Per Mateo Flow, patient can go to urgent care due to no appointment available. Nurse informed patient to go to Urgent Care. Patient voices understanding and has no further questions at this time.

## 2015-02-21 NOTE — Discharge Instructions (Signed)
I have refilled your blood pressure medicines for 1 month. Take the colchicine as prescribed for your gout. Use the Norco every 4-6 hours as needed for severe pain. Please follow-up with your doctor in the next month for your blood pressure medicines.

## 2015-02-21 NOTE — ED Provider Notes (Signed)
CSN: [id number]     Arrival date & time 02/21/15  1808 History   First MD Initiated Contact with Patient 02/21/15 1926     Chief Complaint  Patient presents with  . Knee Pain   (Consider location/radiation/quality/duration/timing/severity/associated sxs/prior Treatment) HPI  He is a 49 year old man here for evaluation of right foot pain. He has a history of gout and states this is his typical gout flare. He called his PCP about a week ago and received prescriptions for tramadol and prednisone. He is taking these medicines, but states there is been no improvement in the pain or the swelling of his right great toe. He also states he needs a refill of his amlodipine for his blood pressure.  Past Medical History  Diagnosis Date  . Diabetes mellitus without complication ([place])   . Hypertension   . Asthma    History reviewed. No pertinent past surgical history. History reviewed. No pertinent family history. Social History  Substance Use Topics  . Smoking status: Current Every Day Smoker    Types: Cigarettes  . Smokeless tobacco: None  . Alcohol Use: No    Review of Systems As in history of present illness Allergies  Review of patient's allergies indicates no known allergies.  Home Medications   Prior to Admission medications   Medication Sig Start Date End Date Taking? Authorizing Provider  Albuterol Sulfate (PROAIR RESPICLICK) 123XX123 (90 BASE) MCG/ACT AEPB Inhale 2 puffs into the lungs every 6 (six) hours as needed. 09/26/14   Lance Bosch, NP  amLODipine (NORVASC) 10 MG tablet Take 1 tablet (10 mg total) by mouth daily. 02/21/15   Melony Overly, MD  colchicine 0.6 MG tablet Take 2 tablets now, then 1 tablet an hour later 02/21/15   Melony Overly, MD  hydrALAZINE (APRESOLINE) 10 MG tablet Take 1 tablet (10 mg total) by mouth 3 (three) times daily. 02/21/15   Melony Overly, MD  HYDROcodone-acetaminophen (NORCO) 5-325 MG tablet Take 1 tablet by mouth every 6 (six) hours as needed for  moderate pain. 02/21/15   Melony Overly, MD   Meds Ordered and Administered this Visit  Medications - No data to display  BP 165/107 mmHg  Pulse 88  Temp(Src) 98.7 F (37.1 C) (Oral)  Resp 16  SpO2 99% No data found.   Physical Exam  Constitutional: He is oriented to person, place, and time. He appears well-developed and well-nourished. No distress.  Cardiovascular: Normal rate.   Pulmonary/Chest: Effort normal.  Musculoskeletal:  Right great toe: There is swelling, erythema and tenderness to the MTP joint.  Neurological: He is alert and oriented to person, place, and time.    ED Course  Procedures (including critical care time)  Labs Review Labs Reviewed - No data to display  Imaging Review No results found.    MDM   1. Acute idiopathic gout of right foot   2. Essential hypertension    Presentation is consistent with gout. Will treat acutely with colchicine. Prescription for Norco given as well. I've provided a one month refill of both his amlodipine and hydralazine.    Melony Overly, MD 02/21/15 2002

## 2015-02-21 NOTE — ED Notes (Signed)
Pt  Reports  r  Knee  Pain  Pt  Has  A  History  Of  Gout  And  Hypertension       He  Needs   Something  For  Pain and  Is  Out  Of  One  Of  His  bp  meds

## 2015-03-01 ENCOUNTER — Ambulatory Visit: Payer: Self-pay | Admitting: Internal Medicine

## 2015-03-13 ENCOUNTER — Ambulatory Visit: Payer: Self-pay | Admitting: Internal Medicine

## 2015-03-29 ENCOUNTER — Ambulatory Visit: Payer: Self-pay | Attending: Internal Medicine | Admitting: Internal Medicine

## 2015-03-29 ENCOUNTER — Encounter: Payer: Self-pay | Admitting: Internal Medicine

## 2015-03-29 VITALS — BP 130/88 | HR 82 | Temp 98.0°F | Resp 16 | Ht 73.0 in | Wt 262.8 lb

## 2015-03-29 DIAGNOSIS — R030 Elevated blood-pressure reading, without diagnosis of hypertension: Secondary | ICD-10-CM

## 2015-03-29 DIAGNOSIS — I10 Essential (primary) hypertension: Secondary | ICD-10-CM

## 2015-03-29 DIAGNOSIS — E119 Type 2 diabetes mellitus without complications: Secondary | ICD-10-CM

## 2015-03-29 DIAGNOSIS — M1A4710 Other secondary chronic gout, right ankle and foot, without tophus (tophi): Secondary | ICD-10-CM

## 2015-03-29 DIAGNOSIS — Z79899 Other long term (current) drug therapy: Secondary | ICD-10-CM | POA: Insufficient documentation

## 2015-03-29 DIAGNOSIS — R358 Other polyuria: Secondary | ICD-10-CM | POA: Insufficient documentation

## 2015-03-29 DIAGNOSIS — Z9119 Patient's noncompliance with other medical treatment and regimen: Secondary | ICD-10-CM | POA: Insufficient documentation

## 2015-03-29 DIAGNOSIS — IMO0001 Reserved for inherently not codable concepts without codable children: Secondary | ICD-10-CM

## 2015-03-29 LAB — COMPLETE METABOLIC PANEL WITH GFR
ALT: 70 U/L — ABNORMAL HIGH (ref 9–46)
AST: 50 U/L — AB (ref 10–35)
Albumin: 3.6 g/dL (ref 3.6–5.1)
Alkaline Phosphatase: 139 U/L — ABNORMAL HIGH (ref 40–115)
BUN: 19 mg/dL (ref 7–25)
CALCIUM: 8.9 mg/dL (ref 8.6–10.3)
CHLORIDE: 101 mmol/L (ref 98–110)
CO2: 22 mmol/L (ref 20–31)
Creat: 1.48 mg/dL — ABNORMAL HIGH (ref 0.70–1.33)
GFR, Est African American: 62 mL/min (ref >=60)
GFR, Est Non African American: 54 mL/min — ABNORMAL LOW (ref >=60)
Glucose, Bld: 171 mg/dL — ABNORMAL HIGH (ref 65–99)
POTASSIUM: 4.3 mmol/L (ref 3.5–5.3)
Sodium: 140 mmol/L (ref 135–146)
Total Bilirubin: 0.3 mg/dL (ref 0.2–1.2)
Total Protein: 6.9 g/dL (ref 6.1–8.1)

## 2015-03-29 LAB — URIC ACID: URIC ACID, SERUM: 9.3 mg/dL — AB (ref 4.0–7.8)

## 2015-03-29 LAB — POCT GLYCOSYLATED HEMOGLOBIN (HGB A1C): Hemoglobin A1C: 7

## 2015-03-29 MED ORDER — HYDRALAZINE HCL 10 MG PO TABS: 10.0000 mg | ORAL_TABLET | Freq: Three times a day (TID) | ORAL | Status: DC

## 2015-03-29 MED ORDER — COLCHICINE 0.6 MG PO TABS: 0.6000 mg | ORAL_TABLET | Freq: Two times a day (BID) | ORAL | Status: DC

## 2015-03-29 MED ORDER — PREDNISONE 50 MG PO TABS: ORAL_TABLET | ORAL | Status: DC

## 2015-03-29 MED ORDER — AMLODIPINE BESYLATE 10 MG PO TABS: 10.0000 mg | ORAL_TABLET | Freq: Every day | ORAL | Status: DC

## 2015-03-29 MED ORDER — CLONIDINE HCL 0.1 MG PO TABS
0.1000 mg | ORAL_TABLET | Freq: Once | ORAL | Status: AC
  Administered 2015-03-29: 0.1 mg via ORAL

## 2015-03-29 MED ORDER — GLIPIZIDE ER 2.5 MG PO TB24: 2.5000 mg | ORAL_TABLET | Freq: Every day | ORAL | Status: DC

## 2015-03-29 NOTE — Progress Notes (Signed)
Patient ID: Craig West, male   DOB: 10-05-1965, 49 y.o.   MRN: [id number] Subjective:  Craig West is a 49 y.o. male with hypertension and gout. Patient reports that she has been out of his blood pressure medication for over one week. He states that he did not call for refills because he wanted to wait until his appointment date. Patient reports that he has been having 2 months of pain in his right great toe unrelieved by prednisone and Tramadol. He has been to the urgent care and was given colchicine but never came back for his follow up appointment with me. He now reports that he has been having polyuria and neuropathy. He wants to be rechecked to see if his A1C has went back up. States that he has been off Metformin for 2 years now because he was controlled with diet and exercise.   Current Outpatient Prescriptions  Medication Sig Dispense Refill  . Albuterol Sulfate (PROAIR RESPICLICK) 123XX123 (90 BASE) MCG/ACT AEPB Inhale 2 puffs into the lungs every 6 (six) hours as needed. 1 each 2  . amLODipine (NORVASC) 10 MG tablet Take 1 tablet (10 mg total) by mouth daily. 30 tablet 0  . hydrALAZINE (APRESOLINE) 10 MG tablet Take 1 tablet (10 mg total) by mouth 3 (three) times daily. 90 tablet 0  . colchicine 0.6 MG tablet Take 2 tablets now, then 1 tablet an hour later 3 tablet 1  . HYDROcodone-acetaminophen (NORCO) 5-325 MG tablet Take 1 tablet by mouth every 6 (six) hours as needed for moderate pain. 15 tablet 0   No current facility-administered medications for this visit.    ROS: Other than what is stated in HPI, all other systems are negative.   Objective:  BP 165/117 mmHg  Pulse 86  Temp(Src) 98 F (36.7 C)  Resp 16  Ht 6\' 1"  (1.854 m)  Wt 262 lb 12.8 oz (119.205 kg)  BMI 34.68 kg/m2  SpO2 100%  Appearance alert, well appearing, and in no distress, oriented to person, place, and time and overweight. General exam BP noted to be severely elevated today in office, S1, S2 normal, no gallop,  no murmur, chest clear, no JVD, no HSM, no edema.  Lab review: orders written for new lab studies as appropriate; see orders.   Assessment:   Junior was seen today for follow-up.  Diagnoses and all orders for this visit:  Elevated blood pressure -     cloNIDine (CATAPRES) tablet 0.1 mg; Take 1 tablet (0.1 mg total) by mouth once.  Essential hypertension -     amLODipine (NORVASC) 10 MG tablet; Take 1 tablet (10 mg total) by mouth daily. -     hydrALAZINE (APRESOLINE) 10 MG tablet; Take 1 tablet (10 mg total) by mouth 3 (three) times daily. -     COMPLETE METABOLIC PANEL WITH GFR Patient blood pressure remains elevated today due to non-compliance. I will have him return in 2 weeks for blood pressure recheck with nurse. Stressed diet changes, regular exercise regimen, and modifiable risk factors. Will follow up with CMP as needed, Will follow up with patient in 3-6 months.   Type 2 diabetes mellitus without complication, without long-term current use of insulin (HCC) -     HgB A1c -     glipiZIDE (GLIPIZIDE XL) 2.5 MG 24 hr tablet; Take 1 tablet (2.5 mg total) by mouth daily with breakfast. Due to patient's elevated creatinine and no recent GFR, I will not place him back on Metformin. I will  start him on Glipizide daily. Went back over diet and exercise with patient.   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 -     Uric Acid -     predniSONE (DELTASONE) 50 MG tablet; Take 1 tablet per day for 7 days Due to kidney function he will not be able to go on Indocin. I offered patient Prednisone but he states that he will not take it because it did not work for him in the past. Will place him on daily colchicine to help lower uric acid level.    Return for Nurse Visit-BP check and 3 mo PCP .   Lance Bosch, NP 03/29/2015 1:35 PM

## 2015-03-29 NOTE — Progress Notes (Signed)
Patient here for follow for his gout to his right great toe Patient states the medications he was given are not helping Patient also presents in office with elevated blood pressure Patient states he has been out of his HTN medicine for over a week Per office protocol 0.1mg  catapress given in office

## 2015-04-04 ENCOUNTER — Telehealth: Payer: Self-pay

## 2015-04-04 DIAGNOSIS — R748 Abnormal levels of other serum enzymes: Secondary | ICD-10-CM

## 2015-04-04 NOTE — Telephone Encounter (Signed)
-----   Message from Lance Bosch, NP sent at 03/31/2015  1:58 PM EST ----- Uric acid level was high. Should come down on medication. Kidney function is still high, no NSAID's. His level enzymes are now elevated. Is he a heavy drinker? He needs to come back to be tested for Hep C. Please place future order for acute hepatitis panel

## 2015-04-04 NOTE — Telephone Encounter (Signed)
Spoke with patient this am and he is aware of his lab results Patient is a social drinker and knows he needs to cut back  Order for future labs placed in epic and call transferred to front desk to schedule An appt for labs

## 2015-04-12 ENCOUNTER — Encounter: Payer: Self-pay | Admitting: Pharmacist

## 2015-04-12 ENCOUNTER — Ambulatory Visit: Payer: Self-pay | Attending: Internal Medicine | Admitting: Pharmacist

## 2015-04-12 ENCOUNTER — Ambulatory Visit: Payer: Self-pay

## 2015-04-12 VITALS — BP 136/84 | HR 84

## 2015-04-12 DIAGNOSIS — Z79899 Other long term (current) drug therapy: Secondary | ICD-10-CM | POA: Insufficient documentation

## 2015-04-12 DIAGNOSIS — R748 Abnormal levels of other serum enzymes: Secondary | ICD-10-CM

## 2015-04-12 DIAGNOSIS — I10 Essential (primary) hypertension: Secondary | ICD-10-CM | POA: Insufficient documentation

## 2015-04-12 MED FILL — COLCHICINE 0.6 MG TABLET: 0.6 | 30 days supply | Qty: 60 | Fill #0

## 2015-04-12 NOTE — Patient Instructions (Signed)
Thanks for coming to see me!  Keep taking all of your medications and get your refills on time - allowing your blood pressure to go up can really hurt you heart and brain  Follow up with Chari Manning as directed.  Hypertension Hypertension is another name for high blood pressure. High blood pressure forces your heart to work harder to pump blood. A blood pressure reading has two numbers, which includes a higher number over a lower number (example: 110/72). HOME CARE   Have your blood pressure rechecked by your doctor.  Only take medicine as told by your doctor. Follow the directions carefully. The medicine does not work as well if you skip doses. Skipping doses also puts you at risk for problems.  Do not smoke.  Monitor your blood pressure at home as told by your doctor. GET HELP IF:  You think you are having a reaction to the medicine you are taking.  You have repeat headaches or feel dizzy.  You have puffiness (swelling) in your ankles.  You have trouble with your vision. GET HELP RIGHT AWAY IF:   You get a very bad headache and are confused.  You feel weak, numb, or faint.  You get chest or belly (abdominal) pain.  You throw up (vomit).  You cannot breathe very well. MAKE SURE YOU:   Understand these instructions.  Will watch your condition.  Will get help right away if you are not doing well or get worse.   This information is not intended to replace advice given to you by your health care provider. Make sure you discuss any questions you have with your health care provider.   Document Released: 09/04/2007 Document Revised: 03/23/2013 Document Reviewed: 01/08/2013 Elsevier Interactive Patient Education [employer].

## 2015-04-12 NOTE — Progress Notes (Signed)
S:    Patient arrives in good spirits.  Presents to the clinic for hypertension evaluation.   Patient reports adherence with medications. Patient reports that he has been trying to be better about taking his medications and not running out of them.  Current BP Medications include:  Amlodipine 10 mg daily and hydralazine 10 mg TID.  Patient is going to get his blood work today.   O:   Last 3 Office BP readings: BP Readings from Last 3 Encounters:  04/12/15 136/84  03/29/15 130/88  02/21/15 165/107    BMET    Component Value Date/Time   NA 140 03/29/2015 1035   K 4.3 03/29/2015 1035   CL 101 03/29/2015 1035   CO2 22 03/29/2015 1035   GLUCOSE 171* 03/29/2015 1035   BUN 19 03/29/2015 1035   CREATININE 1.48* 03/29/2015 1035   CREATININE 1.40 09/19/2009 2251   CALCIUM 8.9 03/29/2015 1035   GFRNONAA 54* 03/29/2015 1035   GFRNONAA >60 07/12/2008 1023   GFRAA 62 03/29/2015 1035   GFRAA  07/12/2008 1023    >60        The eGFR has been calculated using the MDRD equation. This calculation has not been validated in all clinical situations. eGFR's persistently <60 mL/min signify possible Chronic Kidney Disease.    A/P: History of hypertension currently controlled on current medications. No recommendations for any changes. Instructed patient to get his medications refilled on time because allowing his blood pressure to spike can leave to heart attack and stroke. Might consider use of ACEi or ARB in the future as patient has diabetes. However, I would not initiate this now as patient is undergoing further evaluation of elevated hepatic enzymes and these drugs need to be used with caution in hepatic impairment (they can increase levels of hepatic enzymes).  Results reviewed and written information provided.   Total time in face-to-face counseling 20 minutes.   F/U Clinic Visit with Chari Manning, NP, as directed.

## 2015-04-13 LAB — HEPATITIS PANEL, ACUTE
HCV Ab: NEGATIVE
HEP A IGM: NONREACTIVE
Hep B C IgM: NONREACTIVE
Hepatitis B Surface Ag: NEGATIVE

## 2015-04-14 ENCOUNTER — Other Ambulatory Visit: Payer: Self-pay

## 2015-04-14 DIAGNOSIS — M109 Gout, unspecified: Secondary | ICD-10-CM

## 2015-04-17 ENCOUNTER — Telehealth: Payer: Self-pay

## 2015-04-17 NOTE — Telephone Encounter (Signed)
-----   Message from Lance Bosch, NP sent at 04/14/2015  4:12 PM EST ----- Negative hepatitis

## 2015-04-17 NOTE — Telephone Encounter (Signed)
Spoke with patient and he is aware of his negative hepatitis panel

## 2015-04-19 ENCOUNTER — Other Ambulatory Visit: Payer: Self-pay | Admitting: Internal Medicine

## 2015-04-19 DIAGNOSIS — M1A4710 Other secondary chronic gout, right ankle and foot, without tophus (tophi): Secondary | ICD-10-CM

## 2015-04-19 MED ORDER — COLCHICINE 0.6 MG PO TABS: 0.6000 mg | ORAL_TABLET | Freq: Two times a day (BID) | ORAL | Status: DC

## 2015-04-26 MED FILL — AMLODIPINE BESYLATE 10 MG T: 10 | 30 days supply | Qty: 30 | Fill #1

## 2015-04-26 MED FILL — glipiZIDE ER 2.5 MG TB24: 2.5 | 30 days supply | Qty: 30 | Fill #1

## 2015-05-01 ENCOUNTER — Ambulatory Visit: Payer: Self-pay | Admitting: Sports Medicine

## 2015-05-29 MED FILL — glipiZIDE ER 2.5 MG TB24: 2.5 | 30 days supply | Qty: 30 | Fill #2

## 2015-05-29 MED FILL — hydrALAZINE HCL 10 MG TABS: 10 | 30 days supply | Qty: 90 | Fill #1

## 2015-05-29 MED FILL — AMLODIPINE BESYLATE 10 MG T: 10 | 30 days supply | Qty: 30 | Fill #2

## 2015-06-21 ENCOUNTER — Ambulatory Visit: Payer: Self-pay | Attending: Internal Medicine | Admitting: Internal Medicine

## 2015-06-21 ENCOUNTER — Encounter: Payer: Self-pay | Admitting: Internal Medicine

## 2015-06-21 VITALS — BP 159/101 | HR 72 | Temp 98.0°F | Resp 16 | Ht 73.0 in | Wt 261.0 lb

## 2015-06-21 DIAGNOSIS — N182 Chronic kidney disease, stage 2 (mild): Secondary | ICD-10-CM

## 2015-06-21 DIAGNOSIS — I10 Essential (primary) hypertension: Secondary | ICD-10-CM

## 2015-06-21 DIAGNOSIS — E119 Type 2 diabetes mellitus without complications: Secondary | ICD-10-CM

## 2015-06-21 LAB — GLUCOSE, POCT (MANUAL RESULT ENTRY): POC Glucose: 172 mg/dl — AB (ref 70–99)

## 2015-06-21 LAB — POCT GLYCOSYLATED HEMOGLOBIN (HGB A1C): Hemoglobin A1C: 6.7

## 2015-06-21 MED ORDER — HYDRALAZINE HCL 10 MG PO TABS: 10.0000 mg | ORAL_TABLET | Freq: Three times a day (TID) | ORAL | Status: DC

## 2015-06-21 MED ORDER — AMLODIPINE BESYLATE 10 MG PO TABS: 10.0000 mg | ORAL_TABLET | Freq: Every day | ORAL | Status: DC

## 2015-06-21 MED ORDER — GLIPIZIDE ER 2.5 MG PO TB24: 2.5000 mg | ORAL_TABLET | Freq: Every day | ORAL | Status: DC

## 2015-06-21 NOTE — Progress Notes (Signed)
Patient ID: Craig West, male   DOB: June 16, 1965, 50 y.o.   MRN: [id number] SUBJECTIVE: 50 y.o. male for follow up of diabetes and hypertension. Patient states that he has not taken his second dose of hydralazine today so his blood pressure is elevated. He states that he usually keeps a few pills at work so that he can remember to take the mid day dose. He has no complications of the medication. Diabetic Review of Systems - medication compliance: compliant all of the time, diabetic diet compliance: compliant all of the time, further diabetic ROS: no polyuria or polydipsia, no chest pain, dyspnea or TIA's, no numbness, tingling or pain in extremities, no unusual visual symptoms, no hypoglycemia.    Current Outpatient Prescriptions  Medication Sig Dispense Refill  . amLODipine (NORVASC) 10 MG tablet Take 1 tablet (10 mg total) by mouth daily. 30 tablet 3  . colchicine 0.6 MG tablet Take 1 tablet (0.6 mg total) by mouth 2 (two) times daily. 180 tablet 3  . glipiZIDE (GLIPIZIDE XL) 2.5 MG 24 hr tablet Take 1 tablet (2.5 mg total) by mouth daily with breakfast. 30 tablet 3  . hydrALAZINE (APRESOLINE) 10 MG tablet Take 1 tablet (10 mg total) by mouth 3 (three) times daily. 90 tablet 3  . Albuterol Sulfate (PROAIR RESPICLICK) 123XX123 (90 BASE) MCG/ACT AEPB Inhale 2 puffs into the lungs every 6 (six) hours as needed. (Patient not taking: Reported on 06/21/2015) 1 each 2   No current facility-administered medications for this visit.  ROS: Other than what is stated in HPI, all other systems are negative.   OBJECTIVE: Appearance: alert, well appearing, and in no distress, oriented to person, place, and time and overweight. BP 159/101 mmHg  Pulse 72  Temp(Src) 98 F (36.7 C)  Resp 16  Ht 6\' 1"  (1.854 m)  Wt 261 lb (118.389 kg)  BMI 34.44 kg/m2  SpO2 100%  Exam: heart sounds normal rate, regular rhythm, normal S1, S2, no murmurs, rubs, clicks or gallops, no JVD, chest clear, no hepatosplenomegaly, no carotid  bruits, no edema  ASSESSMENT: Holman was seen today for follow-up.  Diagnoses and all orders for this visit:  Type 2 diabetes mellitus without complication, without long-term current use of insulin (HCC) -     Glucose (CBG) -     HgB A1c -     glipiZIDE (GLIPIZIDE XL) 2.5 MG 24 hr tablet; Take 1 tablet (2.5 mg total) by mouth daily with breakfast. Patient blood pressure is stable and may continue on current medication.  Education on diet, exercise, and modifiable risk factors discussed. Will obtain appropriate labs as needed. Will follow up in 3-6 months. '  Essential hypertension -     amLODipine (NORVASC) 10 MG tablet; Take 1 tablet (10 mg total) by mouth daily. -     hydrALAZINE (APRESOLINE) 10 MG tablet; Take 1 tablet (10 mg total) by mouth 3 (three) times daily. Patients BP is elevated today due to missing dose of medication. I have stressed importance of taking medication as directed to prevent further damage to his kidneys and decrease risk of stroke and heart disease. Patient verbalizes understanding.  Mild CKD, stage 2 See above. Stressed control of diabetes related to kidney disease as well.  PLAN: See orders for this visit as documented in the electronic medical record. Issues reviewed with him: diabetic diet discussed in detail, written exchange diet given, low cholesterol diet, weight control and daily exercise discussed, foot care discussed and Podiatry visits discussed and long  term diabetic complications discussed.   Return in about 3 months (around 09/21/2015).    Lance Bosch, NP 06/22/2015 8:42 AM

## 2015-06-21 NOTE — Progress Notes (Signed)
Patient here for follow up on his diabetes and for medication refills Presents in office with elevated blood pressure but stated has not taken his mid day Dose yet

## 2015-06-28 MED FILL — AMLODIPINE BESYLATE 10 MG T: 10 | 30 days supply | Qty: 30 | Fill #3

## 2015-06-28 MED FILL — COLCHICINE 0.6 MG TABLET: 0.6 | 30 days supply | Qty: 60 | Fill #1

## 2015-06-28 MED FILL — hydrALAZINE HCL 10 MG TABS: 10 | 30 days supply | Qty: 90 | Fill #2

## 2015-06-28 MED FILL — glipiZIDE ER 2.5 MG TB24: 2.5 | 30 days supply | Qty: 30 | Fill #3

## 2015-07-28 ENCOUNTER — Other Ambulatory Visit: Payer: Self-pay | Admitting: Internal Medicine

## 2015-08-07 MED FILL — glipiZIDE ER 2.5 MG TB24: 2.5 | 30 days supply | Qty: 30 | Fill #0

## 2015-08-07 MED FILL — AMLODIPINE BESYLATE 10 MG T: 10 | 30 days supply | Qty: 30 | Fill #0

## 2015-08-07 MED FILL — hydrALAZINE HCL 10 MG TABS: 10 | 30 days supply | Qty: 90 | Fill #3

## 2015-09-15 MED FILL — AMLODIPINE BESYLATE 10 MG T: 10 | 30 days supply | Qty: 30 | Fill #1

## 2015-09-15 MED FILL — glipiZIDE ER 2.5 MG TB24: 2.5 | 30 days supply | Qty: 30 | Fill #1

## 2015-09-15 MED FILL — hydrALAZINE HCL 10 MG TABS: 10 | 30 days supply | Qty: 90 | Fill #0

## 2015-10-16 ENCOUNTER — Encounter: Payer: Self-pay | Admitting: Family Medicine

## 2015-10-16 ENCOUNTER — Ambulatory Visit: Payer: Self-pay | Attending: Family Medicine | Admitting: Family Medicine

## 2015-10-16 VITALS — BP 156/97 | HR 66 | Temp 97.9°F | Resp 20 | Ht 73.0 in | Wt 252.0 lb

## 2015-10-16 DIAGNOSIS — M1A4710 Other secondary chronic gout, right ankle and foot, without tophus (tophi): Secondary | ICD-10-CM

## 2015-10-16 DIAGNOSIS — I10 Essential (primary) hypertension: Secondary | ICD-10-CM

## 2015-10-16 DIAGNOSIS — J45909 Unspecified asthma, uncomplicated: Secondary | ICD-10-CM | POA: Insufficient documentation

## 2015-10-16 DIAGNOSIS — J4520 Mild intermittent asthma, uncomplicated: Secondary | ICD-10-CM

## 2015-10-16 DIAGNOSIS — E119 Type 2 diabetes mellitus without complications: Secondary | ICD-10-CM

## 2015-10-16 LAB — GLUCOSE, POCT (MANUAL RESULT ENTRY): POC Glucose: 131 mg/dl — AB (ref 70–99)

## 2015-10-16 LAB — POCT GLYCOSYLATED HEMOGLOBIN (HGB A1C): Hemoglobin A1C: 6.5

## 2015-10-16 MED ORDER — AMLODIPINE BESYLATE 10 MG PO TABS: 10.0000 mg | ORAL_TABLET | Freq: Every day | ORAL | Status: DC

## 2015-10-16 MED ORDER — ALBUTEROL SULFATE 108 (90 BASE) MCG/ACT IN AEPB: 2.0000 | INHALATION_SPRAY | Freq: Four times a day (QID) | RESPIRATORY_TRACT | Status: DC | PRN

## 2015-10-16 MED ORDER — GLIPIZIDE ER 2.5 MG PO TB24: 2.5000 mg | ORAL_TABLET | Freq: Every day | ORAL | Status: DC

## 2015-10-16 MED ORDER — COLCHICINE 0.6 MG PO TABS: ORAL_TABLET | ORAL | Status: DC

## 2015-10-16 MED ORDER — LISINOPRIL-HYDROCHLOROTHIAZIDE 20-25 MG PO TABS: 1.0000 | ORAL_TABLET | Freq: Every day | ORAL | Status: DC

## 2015-10-16 MED FILL — VENTOLIN HFA 90 MCG INHALER: 108 (90 BAS | 30 days supply | Qty: 18 | Fill #0

## 2015-10-16 MED FILL — COLCHICINE 0.6 MG TABLET: 0.6 | 30 days supply | Qty: 30 | Fill #0

## 2015-10-16 MED FILL — AMLODIPINE BESYLATE 10 MG T: 10 | 30 days supply | Qty: 30 | Fill #0

## 2015-10-16 MED FILL — LISINOPRIL-HCTZ 20-25 MG TA: 20-25 | 30 days supply | Qty: 30 | Fill #0

## 2015-10-16 MED FILL — glipiZIDE XL 2.5 MG TB24: 2.5 | 30 days supply | Qty: 30 | Fill #0

## 2015-10-16 NOTE — Patient Instructions (Signed)
Diabetes Mellitus and Food It is important for you to manage your blood sugar (glucose) level. Your blood glucose level can be greatly affected by what you eat. Eating healthier foods in the appropriate amounts throughout the day at about the same time each day will help you control your blood glucose level. It can also help slow or prevent worsening of your diabetes mellitus. Healthy eating may even help you improve the level of your blood pressure and reach or maintain a healthy weight.  General recommendations for healthful eating and cooking habits include:  Eating meals and snacks regularly. Avoid going long periods of time without eating to lose weight.  Eating a diet that consists mainly of plant-based foods, such as fruits, vegetables, nuts, legumes, and whole grains.  Using low-heat cooking methods, such as baking, instead of high-heat cooking methods, such as deep frying. Work with your dietitian to make sure you understand how to use the Nutrition Facts information on food labels. HOW CAN FOOD AFFECT ME? Carbohydrates Carbohydrates affect your blood glucose level more than any other type of food. Your dietitian will help you determine how many carbohydrates to eat at each meal and teach you how to count carbohydrates. Counting carbohydrates is important to keep your blood glucose at a healthy level, especially if you are using insulin or taking certain medicines for diabetes mellitus. Alcohol Alcohol can cause sudden decreases in blood glucose (hypoglycemia), especially if you use insulin or take certain medicines for diabetes mellitus. Hypoglycemia can be a life-threatening condition. Symptoms of hypoglycemia (sleepiness, dizziness, and disorientation) are similar to symptoms of having too much alcohol.  If your health care provider has given you approval to drink alcohol, do so in moderation and use the following guidelines:  Women should not have more than one drink per day, and men  should not have more than two drinks per day. One drink is equal to:  12 oz of beer.  5 oz of wine.  1 oz of hard liquor.  Do not drink on an empty stomach.  Keep yourself hydrated. Have water, diet soda, or unsweetened iced tea.  Regular soda, juice, and other mixers might contain a lot of carbohydrates and should be counted. WHAT FOODS ARE NOT RECOMMENDED? As you make food choices, it is important to remember that all foods are not the same. Some foods have fewer nutrients per serving than other foods, even though they might have the same number of calories or carbohydrates. It is difficult to get your body what it needs when you eat foods with fewer nutrients. Examples of foods that you should avoid that are high in calories and carbohydrates but low in nutrients include:  Trans fats (most processed foods list trans fats on the Nutrition Facts label).  Regular soda.  Juice.  Candy.  Sweets, such as cake, pie, doughnuts, and cookies.  Fried foods. WHAT FOODS CAN I EAT? Eat nutrient-rich foods, which will nourish your body and keep you healthy. The food you should eat also will depend on several factors, including:  The calories you need.  The medicines you take.  Your weight.  Your blood glucose level.  Your blood pressure level.  Your cholesterol level. You should eat a variety of foods, including:  Protein.  Lean cuts of meat.  Proteins low in saturated fats, such as fish, egg whites, and beans. Avoid processed meats.  Fruits and vegetables.  Fruits and vegetables that may help control blood glucose levels, such as apples, mangoes, and   yams.  Dairy products.  Choose fat-free or low-fat dairy products, such as milk, yogurt, and cheese.  Grains, bread, pasta, and rice.  Choose whole grain products, such as multigrain bread, whole oats, and brown rice. These foods may help control blood pressure.  Fats.  Foods containing healthful fats, such as nuts,  avocado, olive oil, canola oil, and fish. DOES EVERYONE WITH DIABETES MELLITUS HAVE THE SAME MEAL PLAN? Because every person with diabetes mellitus is different, there is not one meal plan that works for everyone. It is very important that you meet with a dietitian who will help you create a meal plan that is just right for you.   This information is not intended to replace advice given to you by your health care provider. Make sure you discuss any questions you have with your health care provider.   Document Released: 12/13/2004 Document Revised: 04/08/2014 Document Reviewed: 02/12/2013 Elsevier Interactive Patient Education 2016 [employer]  

## 2015-10-16 NOTE — Progress Notes (Signed)
Subjective:  Patient ID: Craig West, male    DOB: [birth year]  Age: 50 y.o. MRN: [id number]  CC: Follow-up on hypertension and diabetes.  HPI Craig West is a 50 year old male with history of hypertension, gout, diabetes mellitus (A1c 6.5), asthma who comes into the clinic to establish care with me as he was previously followed by the nurse practitioner who is no longer with the practice.  He reports no gout flare but does have colchicine which he takes as needed. Has been compliant with a low-sodium diet and with his hydralazine and amlodipine however his blood pressure is still slightly elevated  Remains on glipizide for diabetes and denies hypoglycemic episodes or numbness in extremities. He is not up-to-date on her annual eye exam Denies asthma flares and uses pro-air sparingly.    Past Medical History  Diagnosis Date  . Diabetes mellitus without complication ([place])   . Hypertension   . Asthma     No past surgical history on file.   Outpatient Prescriptions Prior to Visit  Medication Sig Dispense Refill  . amLODipine (NORVASC) 10 MG tablet Take 1 tablet (10 mg total) by mouth daily. 30 tablet 3  . colchicine 0.6 MG tablet Take 1 tablet (0.6 mg total) by mouth 2 (two) times daily. 180 tablet 3  . hydrALAZINE (APRESOLINE) 10 MG tablet Take 1 tablet (10 mg total) by mouth 3 (three) times daily. 90 tablet 3  . Albuterol Sulfate (PROAIR RESPICLICK) 123XX123 (90 BASE) MCG/ACT AEPB Inhale 2 puffs into the lungs every 6 (six) hours as needed. (Patient not taking: Reported on 06/21/2015) 1 each 2  . glipiZIDE (GLIPIZIDE XL) 2.5 MG 24 hr tablet Take 1 tablet (2.5 mg total) by mouth daily with breakfast. 30 tablet 3   No facility-administered medications prior to visit.    ROS Review of Systems  Constitutional: Negative for activity change and appetite change.  HENT: Negative for sinus pressure and sore throat.   Eyes: Negative for visual disturbance.  Respiratory: Negative for cough,  chest tightness and shortness of breath.   Cardiovascular: Negative for chest pain and leg swelling.  Gastrointestinal: Negative for abdominal pain, diarrhea, constipation and abdominal distention.  Endocrine: Negative.   Genitourinary: Negative for dysuria.  Musculoskeletal: Negative for myalgias and joint swelling.  Skin: Negative for rash.  Allergic/Immunologic: Negative.   Neurological: Negative for weakness, light-headedness and numbness.  Psychiatric/Behavioral: Negative for suicidal ideas and dysphoric mood.    Objective:  BP 156/97 mmHg  Pulse 66  Temp(Src) 97.9 F (36.6 C) (Oral)  Resp 20  Ht 6\' 1"  (1.854 m)  Wt 252 lb (114.306 kg)  BMI 33.25 kg/m2  SpO2 98%  BP/Weight 10/16/2015 06/21/2015 [id number]  Systolic BP [id number] [id number]  Diastolic BP 97 99991111 84  Wt. (Lbs) 252 261 -  BMI 33.25 34.44 -      Physical Exam  Constitutional: He is oriented to person, place, and time. He appears well-developed and well-nourished.  Cardiovascular: Normal rate, normal heart sounds and intact distal pulses.   No murmur heard. Pulmonary/Chest: Effort normal and breath sounds normal. He has no wheezes. He has no rales. He exhibits no tenderness.  Abdominal: Soft. Bowel sounds are normal. He exhibits no distension and no mass. There is no tenderness.  Musculoskeletal: Normal range of motion.  Neurological: He is alert and oriented to person, place, and time.  Skin: Skin is warm and dry.  Psychiatric: He has a normal mood and affect.     Lab Results  Component Value Date   HGBA1C 6.5 10/16/2015    CMP Latest Ref Rng 03/29/2015 09/26/2014 05/23/2014  Glucose 65 - 99 mg/dL 171(H) 126(H) 126(H)  BUN 7 - 25 mg/dL 19 20 26(H)  Creatinine 0.70 - 1.33 mg/dL 1.48(H) 1.56(H) 1.44(H)  Sodium 135 - 146 mmol/L 140 141 141  Potassium 3.5 - 5.3 mmol/L 4.3 4.7 5.3  Chloride 98 - 110 mmol/L 101 102 99  CO2 20 - 31 mmol/L 22 31 30   Calcium 8.6 - 10.3 mg/dL 8.9 9.6 9.9  Total Protein 6.1 - 8.1  g/dL 6.9 - 7.4  Total Bilirubin 0.2 - 1.2 mg/dL 0.3 - 0.8  Alkaline Phos 40 - 115 U/L 139(H) - 90  AST 10 - 35 U/L 50(H) - 22  ALT 9 - 46 U/L 70(H) - 31       Assessment & Plan:   1. Type 2 diabetes mellitus without complication, without long-term current use of insulin (HCC) Controlled with A1c of 6.5 Advised to schedule annual eye exam-has been provided with options in the community given he has no medical coverage ADA diet. Foot exam performed today - HgB A1c - Glucose (CBG) - COMPLETE METABOLIC PANEL WITH GFR; Future - Lipid panel; Future - Microalbumin / creatinine urine ratio; Future - glipiZIDE (GLIPIZIDE XL) 2.5 MG 24 hr tablet; Take 1 tablet (2.5 mg total) by mouth daily with breakfast.  Dispense: 30 tablet; Refill: 3  2. Essential hypertension Uncontrolled Switch from hydralazine to lisinopril/HCTZ Low-sodium, DASH diet - amLODipine (NORVASC) 10 MG tablet; Take 1 tablet (10 mg total) by mouth daily.  Dispense: 30 tablet; Refill: 3 - lisinopril-hydrochlorothiazide (PRINZIDE,ZESTORETIC) 20-25 MG tablet; Take 1 tablet by mouth daily.  Dispense: 30 tablet; Refill: 3  3. 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  Dispense: 30 tablet; Refill: 3  4. Asthma, mild intermittent, uncomplicated Controlled - Albuterol Sulfate (PROAIR RESPICLICK) 123XX123 (90 Base) MCG/ACT AEPB; Inhale 2 puffs into the lungs every 6 (six) hours as needed.  Dispense: 1 each; Refill: 3   Meds ordered this encounter  Medications  . glipiZIDE (GLIPIZIDE XL) 2.5 MG 24 hr tablet    Sig: Take 1 tablet (2.5 mg total) by mouth daily with breakfast.    Dispense:  30 tablet    Refill:  3  . Albuterol Sulfate (PROAIR RESPICLICK) 123XX123 (90 Base) MCG/ACT AEPB    Sig: Inhale 2 puffs into the lungs every 6 (six) hours as needed.      Dispense:  1 each    Refill:  3  . amLODipine (NORVASC) 10 MG tablet    Sig: Take 1 tablet (10 mg total) by mouth daily.    Dispense:  30 tablet    Refill:  3  . colchicine 0.6 MG tablet    Sig: Take orally 2 tabs (1.2 mg) at the onset of a gout attack and may repeat 1 tablet (0.6 mg) one hour later if symptoms persist.    Dispense:  30 tablet    Refill:  3  . lisinopril-hydrochlorothiazide (PRINZIDE,ZESTORETIC) 20-25 MG tablet    Sig: Take 1 tablet by mouth daily.    Dispense:  30 tablet    Refill:  3    Follow-up: Return in about 3 months (around 01/16/2016) for Follow up on diabetes mellitus.  Arnoldo Morale MD

## 2015-10-16 NOTE — Progress Notes (Signed)
Patient states has not been taking BP med as prescribe. Takes Colchicine prn.  Patient not sure think he takes cholesterol med.  Does not know the name of inhaler (Asthma).

## 2015-10-19 ENCOUNTER — Other Ambulatory Visit: Payer: Self-pay

## 2015-10-23 ENCOUNTER — Ambulatory Visit: Payer: Self-pay | Attending: Internal Medicine

## 2015-10-23 DIAGNOSIS — E119 Type 2 diabetes mellitus without complications: Secondary | ICD-10-CM | POA: Insufficient documentation

## 2015-10-23 LAB — COMPLETE METABOLIC PANEL WITH GFR
ALT: 64 U/L — ABNORMAL HIGH (ref 9–46)
AST: 44 U/L — AB (ref 10–35)
Albumin: 4 g/dL (ref 3.6–5.1)
Alkaline Phosphatase: 96 U/L (ref 40–115)
BUN: 15 mg/dL (ref 7–25)
CALCIUM: 9.1 mg/dL (ref 8.6–10.3)
CHLORIDE: 102 mmol/L (ref 98–110)
CO2: 28 mmol/L (ref 20–31)
CREATININE: 1.18 mg/dL (ref 0.70–1.33)
GFR, Est African American: 82 mL/min (ref >=60)
GFR, Est Non African American: 71 mL/min (ref >=60)
GLUCOSE: 124 mg/dL — AB (ref 65–99)
POTASSIUM: 4.4 mmol/L (ref 3.5–5.3)
SODIUM: 138 mmol/L (ref 135–146)
Total Bilirubin: 0.8 mg/dL (ref 0.2–1.2)
Total Protein: 7.2 g/dL (ref 6.1–8.1)

## 2015-10-23 LAB — MICROALBUMIN / CREATININE URINE RATIO
Creatinine, Urine: 348 mg/dL (ref 20–370)
MICROALB UR: 6.9 mg/dL
MICROALB/CREAT RATIO: 20 ug/mg{creat} (ref <30)

## 2015-10-23 LAB — LIPID PANEL
CHOL/HDL RATIO: 5.2 ratio — AB (ref <=5.0)
CHOLESTEROL: 194 mg/dL (ref 125–200)
HDL: 37 mg/dL — ABNORMAL LOW (ref >=40)
LDL CALC: 111 mg/dL (ref <130)
Triglycerides: 229 mg/dL — ABNORMAL HIGH (ref <150)
VLDL: 46 mg/dL — AB (ref <30)

## 2015-10-24 ENCOUNTER — Other Ambulatory Visit: Payer: Self-pay | Admitting: Family Medicine

## 2015-10-24 ENCOUNTER — Telehealth: Payer: Self-pay

## 2015-10-24 DIAGNOSIS — E785 Hyperlipidemia, unspecified: Secondary | ICD-10-CM | POA: Insufficient documentation

## 2015-10-24 MED ORDER — ATORVASTATIN CALCIUM 20 MG PO TABS: 20.0000 mg | ORAL_TABLET | Freq: Every day | ORAL | 3 refills | Status: DC

## 2015-10-24 NOTE — Telephone Encounter (Signed)
Patient was called by Probation officer and given lab results.  Per Dr. Jarold Song patient is to start lipitor which was sent to pharmacy.  Patient stated understanding and voiced that he will pick the medication up and start it.

## 2015-10-24 NOTE — Telephone Encounter (Signed)
-----   Message from Arnoldo Morale, MD sent at 10/24/2015  9:14 AM EDT ----- Cholesterol is slightly elevated and I have sent a prescription for atorvastatin to his pharmacy.

## 2016-05-20 ENCOUNTER — Encounter (HOSPITAL_COMMUNITY): Payer: Self-pay | Admitting: Emergency Medicine

## 2016-05-20 ENCOUNTER — Emergency Department (HOSPITAL_COMMUNITY)
Admission: EM | Admit: 2016-05-20 | Discharge: 2016-05-21 | Disposition: A | Discharge status: Home / Self Care | Payer: Self-pay | Attending: Emergency Medicine | Admitting: Emergency Medicine

## 2016-05-20 DIAGNOSIS — E119 Type 2 diabetes mellitus without complications: Secondary | ICD-10-CM | POA: Insufficient documentation

## 2016-05-20 DIAGNOSIS — I10 Essential (primary) hypertension: Secondary | ICD-10-CM | POA: Insufficient documentation

## 2016-05-20 DIAGNOSIS — G44209 Tension-type headache, unspecified, not intractable: Secondary | ICD-10-CM | POA: Insufficient documentation

## 2016-05-20 DIAGNOSIS — Z87891 Personal history of nicotine dependence: Secondary | ICD-10-CM | POA: Insufficient documentation

## 2016-05-20 DIAGNOSIS — J45909 Unspecified asthma, uncomplicated: Secondary | ICD-10-CM | POA: Insufficient documentation

## 2016-05-20 MED ORDER — OXYCODONE-ACETAMINOPHEN 5-325 MG PO TABS
ORAL_TABLET | ORAL | Status: AC
  Filled 2016-05-20: qty 1

## 2016-05-20 MED ORDER — OXYCODONE-ACETAMINOPHEN 5-325 MG PO TABS
1.0000 | ORAL_TABLET | ORAL | Status: DC | PRN
  Administered 2016-05-20: 1 via ORAL
  Filled 2016-05-20: qty 1
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
## 2016-05-21 MED ORDER — DIPHENHYDRAMINE HCL 50 MG/ML IJ SOLN
25.0000 mg | Freq: Once | INTRAMUSCULAR | Status: AC
  Administered 2016-05-21: 25 mg via INTRAVENOUS
  Filled 2016-05-21: qty 1

## 2016-05-21 MED ORDER — DIAZEPAM 5 MG PO TABS
5.0000 mg | ORAL_TABLET | Freq: Once | ORAL | Status: AC
  Administered 2016-05-21: 5 mg via ORAL
  Filled 2016-05-21: qty 1

## 2016-05-21 MED ORDER — NAPROXEN 500 MG PO TABS: 500.0000 mg | ORAL_TABLET | Freq: Two times a day (BID) | ORAL | 0 refills | Status: DC

## 2016-05-21 MED ORDER — PROCHLORPERAZINE EDISYLATE 5 MG/ML IJ SOLN
10.0000 mg | Freq: Once | INTRAMUSCULAR | Status: AC
  Administered 2016-05-21: 10 mg via INTRAVENOUS
  Filled 2016-05-21: qty 2

## 2016-05-21 MED ORDER — CYCLOBENZAPRINE HCL 5 MG PO TABS: 5.0000 mg | ORAL_TABLET | Freq: Two times a day (BID) | ORAL | 0 refills | Status: DC | PRN

## 2016-05-21 MED ORDER — NAPROXEN 250 MG PO TABS
500.0000 mg | ORAL_TABLET | Freq: Once | ORAL | Status: AC
  Administered 2016-05-21: 500 mg via ORAL
  Filled 2016-05-21: qty 2

## 2016-05-21 MED ORDER — SODIUM CHLORIDE 0.9 % IV BOLUS (SEPSIS)
1000.0000 mL | Freq: Once | INTRAVENOUS | Status: AC
  Administered 2016-05-21: 1000 mL via INTRAVENOUS

## 2016-05-21 NOTE — Discharge Instructions (Signed)
You were seen today for headache. This may be related to muscle spasm over the right neck. You will be given a muscle relaxer. Naproxen as needed for pain but limit use to 3-5 days.

## 2016-07-11 ENCOUNTER — Emergency Department (HOSPITAL_COMMUNITY): Payer: Self-pay

## 2016-07-11 ENCOUNTER — Emergency Department (HOSPITAL_COMMUNITY)
Admission: EM | Admit: 2016-07-11 | Discharge: 2016-07-11 | Disposition: A | Discharge status: Home / Self Care | Payer: Self-pay | Attending: Emergency Medicine | Admitting: Emergency Medicine

## 2016-07-11 ENCOUNTER — Encounter (HOSPITAL_COMMUNITY): Payer: Self-pay

## 2016-07-11 ENCOUNTER — Encounter: Payer: Self-pay | Admitting: Physician Assistant

## 2016-07-11 ENCOUNTER — Ambulatory Visit: Payer: Self-pay | Attending: Internal Medicine | Admitting: Physician Assistant

## 2016-07-11 VITALS — BP 200/153 | HR 89 | Temp 99.0°F | Resp 16

## 2016-07-11 DIAGNOSIS — M109 Gout, unspecified: Secondary | ICD-10-CM | POA: Insufficient documentation

## 2016-07-11 DIAGNOSIS — I10 Essential (primary) hypertension: Secondary | ICD-10-CM | POA: Insufficient documentation

## 2016-07-11 DIAGNOSIS — E119 Type 2 diabetes mellitus without complications: Secondary | ICD-10-CM | POA: Insufficient documentation

## 2016-07-11 DIAGNOSIS — Z7984 Long term (current) use of oral hypoglycemic drugs: Secondary | ICD-10-CM | POA: Insufficient documentation

## 2016-07-11 DIAGNOSIS — J45909 Unspecified asthma, uncomplicated: Secondary | ICD-10-CM | POA: Insufficient documentation

## 2016-07-11 DIAGNOSIS — Z87891 Personal history of nicotine dependence: Secondary | ICD-10-CM | POA: Insufficient documentation

## 2016-07-11 HISTORY — DX: Gout, unspecified: M10.9

## 2016-07-11 LAB — POCT GLYCOSYLATED HEMOGLOBIN (HGB A1C): Hemoglobin A1C: 6.6

## 2016-07-11 LAB — GLUCOSE, POCT (MANUAL RESULT ENTRY): POC Glucose: 271 mg/dl — AB (ref 70–99)

## 2016-07-11 MED ORDER — GLUCOSE BLOOD VI STRP: ORAL_STRIP | 12 refills | Status: DC

## 2016-07-11 MED ORDER — OXYCODONE-ACETAMINOPHEN 5-325 MG PO TABS
1.0000 | ORAL_TABLET | Freq: Once | ORAL | Status: AC
  Administered 2016-07-11: 1 via ORAL
  Filled 2016-07-11: qty 1

## 2016-07-11 MED ORDER — INDOMETHACIN 25 MG PO CAPS
50.0000 mg | ORAL_CAPSULE | Freq: Once | ORAL | Status: AC
  Administered 2016-07-11: 50 mg via ORAL
  Filled 2016-07-11: qty 2

## 2016-07-11 MED ORDER — METFORMIN HCL 1000 MG PO TABS: 1000.0000 mg | ORAL_TABLET | Freq: Two times a day (BID) | ORAL | 3 refills | Status: DC

## 2016-07-11 MED ORDER — LISINOPRIL 20 MG PO TABS: 20.0000 mg | ORAL_TABLET | Freq: Every day | ORAL | 3 refills | Status: DC

## 2016-07-11 MED ORDER — AMLODIPINE BESYLATE 10 MG PO TABS: 10.0000 mg | ORAL_TABLET | Freq: Every day | ORAL | 3 refills | Status: DC

## 2016-07-11 MED ORDER — TRUE METRIX METER W/DEVICE KIT: PACK | 0 refills | Status: DC

## 2016-07-11 MED ORDER — DEXAMETHASONE SODIUM PHOSPHATE 10 MG/ML IJ SOLN
10.0000 mg | Freq: Once | INTRAMUSCULAR | Status: AC
  Administered 2016-07-11: 10 mg via INTRAMUSCULAR
  Filled 2016-07-11: qty 1

## 2016-07-11 MED ORDER — KETOROLAC TROMETHAMINE 60 MG/2ML IM SOLN
60.0000 mg | Freq: Once | INTRAMUSCULAR | Status: AC
  Administered 2016-07-11: 60 mg via INTRAMUSCULAR

## 2016-07-11 MED ORDER — INDOMETHACIN 50 MG PO CAPS: 50.0000 mg | ORAL_CAPSULE | Freq: Three times a day (TID) | ORAL | 0 refills | Status: DC

## 2016-07-11 MED ORDER — INDOMETHACIN 50 MG PO CAPS: 50.0000 mg | ORAL_CAPSULE | Freq: Three times a day (TID) | ORAL | 0 refills | Status: AC

## 2016-07-11 MED ORDER — TRUEPLUS LANCETS 28G MISC: 0 refills | Status: DC

## 2016-07-11 MED FILL — AMLODIPINE BESYLATE 10 MG T: 10 | 30 days supply | Qty: 30 | Fill #0

## 2016-07-11 MED FILL — INDOMETHACIN 50 MG CAPSULE: 50 | 5 days supply | Qty: 15 | Fill #0

## 2016-07-11 MED FILL — !TRUE METRIX BLOOD GLUCOSE: 30 days supply | Qty: 1 | Fill #0

## 2016-07-11 MED FILL — TRUE METRIX TEST STRIP: 25 days supply | Qty: 100 | Fill #0

## 2016-07-11 MED FILL — TRUEplus LANCETS 28G MISC: 25 days supply | Qty: 100 | Fill #0

## 2016-07-11 MED FILL — LISINOPRIL 20 MG TABLET: 20 | 30 days supply | Qty: 30 | Fill #0

## 2016-07-11 MED FILL — metFORMIN HCL 1000 MG TABS: 1000 | 30 days supply | Qty: 60 | Fill #0

## 2016-07-11 NOTE — Patient Instructions (Addendum)
Check blood sugar fasting and ant bedtime and record and bring to next visit.  Start metformin 1/2 twice daily X 1 week then increase to 1 tablet twice daily.  Check blood pressure out of the office 3 times per week and record and bring to next visit.     Blood Glucose Monitoring, Adult Monitoring your blood sugar (glucose) helps you manage your diabetes. It also helps you and your health care provider determine how well your diabetes management plan is working. Blood glucose monitoring involves checking your blood glucose as often as directed, and keeping a record (log) of your results over time. Why should I monitor my blood glucose? Checking your blood glucose regularly can:  Help you understand how food, exercise, illnesses, and medicines affect your blood glucose.  Let you know what your blood glucose is at any time. You can quickly tell if you are having low blood glucose (hypoglycemia) or high blood glucose (hyperglycemia).  Help you and your health care provider adjust your medicines as needed. When should I check my blood glucose? Follow instructions from your health care provider about how often to check your blood glucose. This may depend on:  The type of diabetes you have.  How well-controlled your diabetes is.  Medicines you are taking. If you have type 1 diabetes:   Check your blood glucose at least 2 times a day.  Also check your blood glucose:  Before every insulin injection.  Before and after exercise.  Between meals.  2 hours after a meal.  Occasionally between 2:00 a.m. and 3:00 a.m., as directed.  Before potentially dangerous tasks, like driving or using heavy machinery.  At bedtime.  You may need to check your blood glucose more often, up to 6-10 times a day:  If you use an insulin pump.  If you need multiple daily injections (MDI).  If your diabetes is not well-controlled.  If you are ill.  If you have a history of severe hypoglycemia.  If  you have a history of not knowing when your blood glucose is getting low (hypoglycemia unawareness). If you have type 2 diabetes:   If you take insulin or other diabetes medicines, check your blood glucose at least 2 times a day.  If you are on intensive insulin therapy, check your blood glucose at least 4 times a day. Occasionally, you may also need to check between 2:00 a.m. and 3:00 a.m., as directed.  Also check your blood glucose:  Before and after exercise.  Before potentially dangerous tasks, like driving or using heavy machinery.  You may need to check your blood glucose more often if:  Your medicine is being adjusted.  Your diabetes is not well-controlled.  You are ill. What is a blood glucose log?  A blood glucose log is a record of your blood glucose readings. It helps you and your health care provider:  Look for patterns in your blood glucose over time.  Adjust your diabetes management plan as needed.  Every time you check your blood glucose, write down your result and notes about things that may be affecting your blood glucose, such as your diet and exercise for the day.  Most glucose meters store a record of glucose readings in the meter. Some meters allow you to download your records to a computer. How do I check my blood glucose? Follow these steps to get accurate readings of your blood glucose: Supplies needed    Blood glucose meter.  Test strips for your  meter. Each meter has its own strips. You must use the strips that come with your meter.  A needle to prick your finger (lancet). Do not use lancets more than once.  A device that holds the lancet (lancing device).  A journal or log book to write down your results. Procedure   Wash your hands with soap and water.  Prick the side of your finger (not the tip) with the lancet. Use a different finger each time.  Gently rub the finger until a small drop of blood appears.  Follow instructions that come  with your meter for inserting the test strip, applying blood to the strip, and using your blood glucose meter.  Write down your result and any notes. Alternative testing sites   Some meters allow you to use areas of your body other than your finger (alternative sites) to test your blood.  If you think you may have hypoglycemia, or if you have hypoglycemia unawareness, do not use alternative sites. Use your finger instead.  Alternative sites may not be as accurate as the fingers, because blood flow is slower in these areas. This means that the result you get may be delayed, and it may be different from the result that you would get from your finger.  The most common alternative sites are:  Forearm.  Thigh.  Palm of the hand. Additional tips   Always keep your supplies with you.  If you have questions or need help, all blood glucose meters have a 24-hour "hotline" number that you can call. You may also contact your health care provider.  After you use a few boxes of test strips, adjust (calibrate) your blood glucose meter by following instructions that came with your meter. This information is not intended to replace advice given to you by your health care provider. Make sure you discuss any questions you have with your health care provider. Document Released: 03/21/2003 Document Revised: 10/06/2015 Document Reviewed: 08/28/2015 Elsevier Interactive Patient Education  2017 [employer]  Hypertension Hypertension, commonly called high blood pressure, is when the force of blood pumping through the arteries is too strong. The arteries are the blood vessels that carry blood from the heart throughout the body. Hypertension forces the heart to work harder to pump blood and may cause arteries to become narrow or stiff. Having untreated or uncontrolled hypertension can cause heart attacks, strokes, kidney disease, and other problems. A blood pressure reading consists of a higher number over a  lower number. Ideally, your blood pressure should be below 120/80. The first ("top") number is called the systolic pressure. It is a measure of the pressure in your arteries as your heart beats. The second ("bottom") number is called the diastolic pressure. It is a measure of the pressure in your arteries as the heart relaxes. What are the causes? The cause of this condition is not known. What increases the risk? Some risk factors for high blood pressure are under your control. Others are not. Factors you can change   Smoking.  Having type 2 diabetes mellitus, high cholesterol, or both.  Not getting enough exercise or physical activity.  Being overweight.  Having too much fat, sugar, calories, or salt (sodium) in your diet.  Drinking too much alcohol. Factors that are difficult or impossible to change   Having chronic kidney disease.  Having a family history of high blood pressure.  Age. Risk increases with age.  Race. You may be at higher risk if you are African-American.  Gender.  Men are at higher risk than women before age 37. After age 63, women are at higher risk than men.  Having obstructive sleep apnea.  Stress. What are the signs or symptoms? Extremely high blood pressure (hypertensive crisis) may cause:  Headache.  Anxiety.  Shortness of breath.  Nosebleed.  Nausea and vomiting.  Severe chest pain.  Jerky movements you cannot control (seizures). How is this diagnosed? This condition is diagnosed by measuring your blood pressure while you are seated, with your arm resting on a surface. The cuff of the blood pressure monitor will be placed directly against the skin of your upper arm at the level of your heart. It should be measured at least twice using the same arm. Certain conditions can cause a difference in blood pressure between your right and left arms. Certain factors can cause blood pressure readings to be lower or higher than normal (elevated) for a  short period of time:  When your blood pressure is higher when you are in a health care provider's office than when you are at home, this is called white coat hypertension. Most people with this condition do not need medicines.  When your blood pressure is higher at home than when you are in a health care provider's office, this is called masked hypertension. Most people with this condition may need medicines to control blood pressure. If you have a high blood pressure reading during one visit or you have normal blood pressure with other risk factors:  You may be asked to return on a different day to have your blood pressure checked again.  You may be asked to monitor your blood pressure at home for 1 week or longer. If you are diagnosed with hypertension, you may have other blood or imaging tests to help your health care provider understand your overall risk for other conditions. How is this treated? This condition is treated by making healthy lifestyle changes, such as eating healthy foods, exercising more, and reducing your alcohol intake. Your health care provider may prescribe medicine if lifestyle changes are not enough to get your blood pressure under control, and if:  Your systolic blood pressure is above 130.  Your diastolic blood pressure is above 80. Your personal target blood pressure may vary depending on your medical conditions, your age, and other factors. Follow these instructions at home: Eating and drinking   Eat a diet that is high in fiber and potassium, and low in sodium, added sugar, and fat. An example eating plan is called the DASH (Dietary Approaches to Stop Hypertension) diet. To eat this way:  Eat plenty of fresh fruits and vegetables. Try to fill half of your plate at each meal with fruits and vegetables.  Eat whole grains, such as whole wheat pasta, brown rice, or whole grain bread. Fill about one quarter of your plate with whole grains.  Eat or drink low-fat  dairy products, such as skim milk or low-fat yogurt.  Avoid fatty cuts of meat, processed or cured meats, and poultry with skin. Fill about one quarter of your plate with lean proteins, such as fish, chicken without skin, beans, eggs, and tofu.  Avoid premade and processed foods. These tend to be higher in sodium, added sugar, and fat.  Reduce your daily sodium intake. Most people with hypertension should eat less than 1,500 mg of sodium a day.  Limit alcohol intake to no more than 1 drink a day for nonpregnant women and 2 drinks a day for men. One  drink equals 12 oz of beer, 5 oz of wine, or 1 oz of hard liquor. Lifestyle   Work with your health care provider to maintain a healthy body weight or to lose weight. Ask what an ideal weight is for you.  Get at least 30 minutes of exercise that causes your heart to beat faster (aerobic exercise) most days of the week. Activities may include walking, swimming, or biking.  Include exercise to strengthen your muscles (resistance exercise), such as pilates or lifting weights, as part of your weekly exercise routine. Try to do these types of exercises for 30 minutes at least 3 days a week.  Do not use any products that contain nicotine or tobacco, such as cigarettes and e-cigarettes. If you need help quitting, ask your health care provider.  Monitor your blood pressure at home as told by your health care provider.  Keep all follow-up visits as told by your health care provider. This is important. Medicines   Take over-the-counter and prescription medicines only as told by your health care provider. Follow directions carefully. Blood pressure medicines must be taken as prescribed.  Do not skip doses of blood pressure medicine. Doing this puts you at risk for problems and can make the medicine less effective.  Ask your health care provider about side effects or reactions to medicines that you should watch for. Contact a health care provider  if:  You think you are having a reaction to a medicine you are taking.  You have headaches that keep coming back (recurring).  You feel dizzy.  You have swelling in your ankles.  You have trouble with your vision. Get help right away if:  You develop a severe headache or confusion.  You have unusual weakness or numbness.  You feel faint.  You have severe pain in your chest or abdomen.  You vomit repeatedly.  You have trouble breathing. Summary  Hypertension is when the force of blood pumping through your arteries is too strong. If this condition is not controlled, it may put you at risk for serious complications.  Your personal target blood pressure may vary depending on your medical conditions, your age, and other factors. For most people, a normal blood pressure is less than 120/80.  Hypertension is treated with lifestyle changes, medicines, or a combination of both. Lifestyle changes include weight loss, eating a healthy, low-sodium diet, exercising more, and limiting alcohol. This information is not intended to replace advice given to you by your health care provider. Make sure you discuss any questions you have with your health care provider. Document Released: 03/18/2005 Document Revised: 02/14/2016 Document Reviewed: 02/14/2016 Elsevier Interactive Patient Education  2017 [employer].  Gout Gout is painful swelling that can happen in some of your joints. Gout is a type of arthritis. This condition is caused by having too much uric acid in your body. Uric acid is a chemical that is made when your body breaks down substances called purines. If your body has too much uric acid, sharp crystals can form and build up in your joints. This causes pain and swelling. Gout attacks can happen quickly and be very painful (acute gout). Over time, the attacks can affect more joints and happen more often (chronic gout). Follow these instructions at home: During a Gout Attack   If  directed, put ice on the painful area:  Put ice in a plastic bag.  Place a towel between your skin and the bag.  Leave the ice on for 20  minutes, 2-3 times a day.  Rest the joint as much as possible. If the joint is in your leg, you may be given crutches to use.  Raise (elevate) the painful joint above the level of your heart as often as you can.  Drink enough fluids to keep your pee (urine) clear or pale yellow.  Take over-the-counter and prescription medicines only as told by your doctor.  Do not drive or use heavy machinery while taking prescription pain medicine.  Follow instructions from your doctor about what you can or cannot eat and drink.  Return to your normal activities as told by your doctor. Ask your doctor what activities are safe for you. Avoiding Future Gout Attacks   Follow a low-purine diet as told by a specialist (dietitian) or your doctor. Avoid foods and drinks that have a lot of purines, such as:  Liver.  Kidney.  Anchovies.  Asparagus.  Herring.  Mushrooms  Mussels.  Beer.  Limit alcohol intake to no more than 1 drink a day for nonpregnant women and 2 drinks a day for men. One drink equals 12 oz of beer, 5 oz of wine, or 1 oz of hard liquor.  Stay at a healthy weight or lose weight if you are overweight. If you want to lose weight, talk with your doctor. It is important that you do not lose weight too fast.  Start or continue an exercise plan as told by your doctor.  Drink enough fluids to keep your pee clear or pale yellow.  Take over-the-counter and prescription medicines only as told by your doctor.  Keep all follow-up visits as told by your doctor. This is important. Contact a doctor if:  You have another gout attack.  You still have symptoms of a gout attack after10 days of treatment.  You have problems (side effects) because of your medicines.  You have chills or a fever.  You have burning pain when you pee (urinate).  You  have pain in your lower back or belly. Get help right away if:  You have very bad pain.  Your pain cannot be controlled.  You cannot pee. This information is not intended to replace advice given to you by your health care provider. Make sure you discuss any questions you have with your health care provider. Document Released: 12/26/2007 Document Revised: 08/24/2015 Document Reviewed: 12/29/2014 Elsevier Interactive Patient Education  2017 [employer].

## 2016-07-11 NOTE — Progress Notes (Signed)
Patient ID: Craig West, male   DOB: [birth year], 51 y.o.   MRN: [id number]   Craig West, is a 51 y.o. male  SVX:[id number]  [id number]  DOB - 1965-08-21  Subjective:  Chief Complaint and HPI: Craig West is a 51 y.o. male here today to for a follow up visit Was seen in the Ed this morning for gout.  He has been having this flare for about 1 week and it is getting progressively worse.  L ankle and R great toe affected.  Unable to walk secondary to pain and comes in in a wheelchair.   BP there was 107/96.  BP very high here and previously on multiple BP meds that he "weaned himself off of" after starting to exercise.  He has not been seen in our office since 09/2015.  He also stopped taking his diabetes medications believing that his diabetes was under control.  He does not have a glucometer and has not checked his blood sugar in a long time.  Denies hyper/hypoglycemia.  ED/Hospital notes reviewed  ROS:   Constitutional:  No f/c, No night sweats, No unexplained weight loss. EENT:  No vision changes, No blurry vision, No hearing changes. No mouth, throat, or ear problems.  Respiratory: No cough, No SOB Cardiac: No CP, no palpitations GI:  No abd pain, No N/V/D. GU: No Urinary s/sx Musculoskeletal: + joint pain as above Neuro: No headache, no dizziness, no motor weakness.  Skin: No rash Endocrine:  No polydipsia. No polyuria.  Psych: Denies SI/HI  No problems updated.  ALLERGIES: No Known Allergies  PAST MEDICAL HISTORY: Past Medical History:  Diagnosis Date  . Asthma   . Diabetes mellitus without complication ([place])   . Gout   . Hypertension     MEDICATIONS AT HOME: Prior to Admission medications   Medication Sig Start Date End Date Taking? Authorizing Provider  Albuterol Sulfate (PROAIR RESPICLICK) 354 (90 Base) MCG/ACT AEPB Inhale 2 puffs into the lungs every 6 (six) hours as needed. Patient taking differently: Inhale 2 puffs into the lungs every 6 (six) hours as  needed (shortness of breath).  10/16/15   Arnoldo Morale, MD  amLODipine (NORVASC) 10 MG tablet Take 1 tablet (10 mg total) by mouth daily. 07/11/16   Argentina Donovan, PA-C  Blood Glucose Monitoring Suppl (TRUE METRIX METER) w/Device KIT Check blood sugar fasting and ant bedtime and record 07/11/16   Argentina Donovan, PA-C  cyclobenzaprine (FLEXERIL) 5 MG tablet Take 1 tablet (5 mg total) by mouth 2 (two) times daily as needed for muscle spasms. 05/21/16   Merryl Hacker, MD  glucose blood (TRUE METRIX BLOOD GLUCOSE TEST) test strip Use as instructed 07/11/16   Argentina Donovan, PA-C  indomethacin (INDOCIN) 50 MG capsule Take 1 capsule (50 mg total) by mouth 3 (three) times daily with meals. 07/11/16 07/16/16  Argentina Donovan, PA-C  lisinopril (PRINIVIL,ZESTRIL) 20 MG tablet Take 1 tablet (20 mg total) by mouth daily. 07/11/16   Argentina Donovan, PA-C  metFORMIN (GLUCOPHAGE) 1000 MG tablet Take 1 tablet (1,000 mg total) by mouth 2 (two) times daily with a meal. 07/11/16   Argentina Donovan, PA-C  naproxen (NAPROSYN) 500 MG tablet Take 1 tablet (500 mg total) by mouth 2 (two) times daily. Limit use to 3-5 days. 05/21/16   Merryl Hacker, MD  TRUEPLUS LANCETS 28G MISC Check blood sugar fasting and at bedtime 07/11/16   Argentina Donovan, PA-C     Objective:  EXAM:  Vitals:   07/11/16 0918  BP: (!) 200/153  Pulse: 89  Resp: 16  Temp: 99 F (37.2 C)  TempSrc: Oral  SpO2: 97%    General appearance : A&OX3. NAD. Non-toxic-appearing HEENT: Atraumatic and Normocephalic.  PERRLA. EOM intact.  TM clear B. Mouth-MMM, post pharynx WNL w/o erythema, No PND. Neck: supple, no JVD. No cervical lymphadenopathy. No thyromegaly Chest/Lungs:  Breathing-non-labored, Good air entry bilaterally, breath sounds normal without rales, rhonchi, or wheezing  CVS: S1 S2 regular, no murmurs, gallops, rubs  Extremities: Bilateral Lower Ext shows no edema, both legs are warm to touch with = pulse throughout Neurology:   CN II-XII grossly intact, Non focal.  L medial ankle with midl swelling and erythema overlying joint.  +Exquisitely TTP and pain with attempt at passive ROM.  R great toe also affected the same. Psych:  TP linear. J/I WNL. Normal speech. Appropriate eye contact and affect.  Skin:  No Rash  Data Review Lab Results  Component Value Date   HGBA1C 6.6 07/11/2016   HGBA1C 6.5 10/16/2015   HGBA1C 6.7 06/21/2015     Assessment & Plan   1. Essential hypertension Uncontrolled- restart- amLODipine (NORVASC) 10 MG tablet; Take 1 tablet (10 mg total) by mouth daily.  Dispense: 90 tablet; Refill: 3 restart- lisinopril (PRINIVIL,ZESTRIL) 20 MG tablet; Take 1 tablet (20 mg total) by mouth daily.  Dispense: 90 tablet; Refill: 3 - Comprehensive metabolic panel - CBC with Differential/Platelet Considered Clonidine in office, but deferred due to lower BP reading at the ED this morning, will just resume Amlodipine and Lisinopril as he tolerated these previously and BP appears overall very high despite a lower reading this morning in the ED.  Based on the readings we obtained with digital and manual readings, I believe the BP in ED may have an error. Check blood pressure out of the office 3 times per week and record and bring to next visit.  2. Type 2 diabetes mellitus without complication, without long-term current use of insulin (HCC) Uncontrolled - metFORMIN (GLUCOPHAGE) 1000 MG tablet; Take 1 tablet (1,000 mg total) by mouth 2 (two) times daily with a meal.  Dispense: 180 tablet; Refill: 3 - Blood Glucose Monitoring Suppl (TRUE METRIX METER) w/Device KIT; Check blood sugar fasting and ant bedtime and record  Dispense: 1 kit; Refill: 0 - glucose blood (TRUE METRIX BLOOD GLUCOSE TEST) test strip; Use as instructed  Dispense: 100 each; Refill: 12 - TRUEPLUS LANCETS 28G MISC; Check blood sugar fasting and at bedtime  Dispense: 100 each; Refill: 0 - Comprehensive metabolic panel - Glucose (CBG) - HgB  A1c Check blood sugar fasting and ant bedtime and record and bring to next visit.  3. Acute gout of multiple sites, unspecified cause Patient education provided.  Alcohol cessation encouraged.  This would help BP as well. - indomethacin (INDOCIN) 50 MG capsule; Take 1 capsule (50 mg total) by mouth 3 (three) times daily with meals.  Dispense: 15 capsule; Refill: 0 - ketorolac (TORADOL) injection 60 mg; Inject 2 mLs (60 mg total) into the muscle once. - CBC with Differential/Platelet - Uric Acid  Patient have been counseled extensively about nutrition and exercise  Return in about 3 weeks (around 08/01/2016) for recheck DM, htn, and gout after restarting all meds with Dr Jarold Song.  The patient was given clear instructions to go to ER or return to medical center if symptoms don't improve, worsen or new problems develop. The patient verbalized understanding. The patient was told to call  to get lab results if they haven't heard anything in the next week.     Freeman Caldron, PA-C [facility] [place], [place]   07/11/2016, 10:53 AM

## 2016-07-11 NOTE — Discharge Instructions (Addendum)
Please read and follow all provided instructions.  Your diagnoses today include:  1. Acute gout of left ankle, unspecified cause   2. Acute gout involving toe of right foot, unspecified cause     Tests performed today include: Vital signs. See below for your results today.   Medications prescribed:  Take as prescribed   Home care instructions:  Follow any educational materials contained in this packet.  Follow-up instructions: Please follow-up with your primary care provider for further evaluation of symptoms and treatment   Return instructions:  Please return to the Emergency Department if you do not get better, if you get worse, or new symptoms OR  - Fever (temperature greater than 101.64F)  - Bleeding that does not stop with holding pressure to the area    -Severe pain (please note that you may be more sore the day after your accident)  - Chest Pain  - Difficulty breathing  - Severe nausea or vomiting  - Inability to tolerate food and liquids  - Passing out  - Skin becoming red around your wounds  - Change in mental status (confusion or lethargy)  - New numbness or weakness    Please return if you have any other emergent concerns.  Additional Information:  Your vital signs today were: BP (!) 107/96    Pulse 96    Temp 99.1 F (37.3 C) (Oral)    Resp 18    SpO2 95%  If your blood pressure (BP) was elevated above 135/85 this visit, please have this repeated by your doctor within one month. --------------

## 2016-07-11 NOTE — ED Triage Notes (Signed)
Pt comes via [facility] for GOUT of both feet worse on the L, some swelling and redness.  Woke him up about 2 hours ago

## 2016-07-11 NOTE — ED Provider Notes (Signed)
[facility] DEPT Provider Note   CSN: [id number] Arrival date & time: 07/11/16  0440     History   Chief Complaint Chief Complaint  Patient presents with  . Gout    HPI Craig West is a 51 y.o. male.  HPI  51 y.o. male with a hx of DM, Gout, HTN, presents to the Emergency Department today complaining of bilateral feet pain with L > R with associated redness and swelling. Pt states this woke him up x 2 hours ago. Hx same. Pt states that he drinks ETOH. Does not take Gout medications. No fevers. Notes pain with ambulation. Rates pain 10/10. Appointment to see PCP at 8:30a this morning, but patient could not wait. No N/V. No CP/SOB. No other symptoms noted.   Past Medical History:  Diagnosis Date  . Asthma   . Diabetes mellitus without complication ([place])   . Gout   . Hypertension     Patient Active Problem List   Diagnosis Date Noted  . Hyperlipidemia 10/24/2015  . Asthma 10/16/2015  . Type 2 diabetes mellitus without complication ([place]) 72/12/4707  . Essential hypertension 06/13/2014  . Smoker 06/13/2014    History reviewed. No pertinent surgical history.     Home Medications    Prior to Admission medications   Medication Sig Start Date End Date Taking? Authorizing Provider  Albuterol Sulfate (PROAIR RESPICLICK) 628 (90 Base) MCG/ACT AEPB Inhale 2 puffs into the lungs every 6 (six) hours as needed. Patient taking differently: Inhale 2 puffs into the lungs every 6 (six) hours as needed (shortness of breath).  10/16/15   Arnoldo Morale, MD  cyclobenzaprine (FLEXERIL) 5 MG tablet Take 1 tablet (5 mg total) by mouth 2 (two) times daily as needed for muscle spasms. 05/21/16   Merryl Hacker, MD  naproxen (NAPROSYN) 500 MG tablet Take 1 tablet (500 mg total) by mouth 2 (two) times daily. Limit use to 3-5 days. 05/21/16   Merryl Hacker, MD    Family History No family history on file.  Social History Social History  Substance Use Topics  . Smoking status: Former  Smoker    Types: Cigarettes  . Smokeless tobacco: Never Used     Comment: quit 3 weeks ago  . Alcohol use No     Allergies   Patient has no known allergies.   Review of Systems Review of Systems  Constitutional: Negative for fever.  Gastrointestinal: Negative for nausea and vomiting.  Musculoskeletal: Positive for arthralgias. Negative for neck pain.  Skin: Negative for wound.   Physical Exam Updated Vital Signs BP (!) 107/96   Pulse 96   Temp 99.1 F (37.3 C) (Oral)   Resp 18   SpO2 95%   Physical Exam  Constitutional: He is oriented to person, place, and time. Vital signs are normal. He appears well-developed and well-nourished.  HENT:  Head: Normocephalic and atraumatic.  Right Ear: Hearing normal.  Left Ear: Hearing normal.  Eyes: Conjunctivae and EOM are normal. Pupils are equal, round, and reactive to light.  Neck: Normal range of motion. Neck supple.  Cardiovascular: Normal rate, regular rhythm, normal heart sounds and intact distal pulses.   Pulmonary/Chest: Effort normal and breath sounds normal.  Musculoskeletal:  Swelling noted on lateral aspect of right ankle below lateral malleolus. No erythema. TTP. Also noted swelling with mild erythema to left 1st digit of left foot. TTP. No red streaking. No open wounds or signs of infection noted.   Neurological: He is alert and oriented to  person, place, and time.  Skin: Skin is warm and dry.  Psychiatric: He has a normal mood and affect. His speech is normal and behavior is normal. Thought content normal.  Nursing note and vitals reviewed.  ED Treatments / Results  Labs (all labs ordered are listed, but only abnormal results are displayed) Labs Reviewed - No data to display  EKG  EKG Interpretation None      Radiology Dg Ankle Complete Left  Result Date: 07/11/2016 CLINICAL DATA:  Left ankle pain for 2 days.  History of gout. EXAM: LEFT ANKLE COMPLETE - 3+ VIEW COMPARISON:  None. FINDINGS: Soft tissue  swelling about the ankle. No visible joint effusion. No erosion or soft tissue calcifications seen. Negative for acute fracture or malalignment. In the lateral projection the talar neck appears truncated. This may be developmental or from remote trauma. Mild anterior tibiotalar spurring. IMPRESSION: Soft tissue swelling without acute osseous finding or erosion. Electronically Signed   By: Monte Fantasia M.D.   On: 07/11/2016 07:14    Procedures Procedures (including critical care time)  Medications Ordered in ED Medications  oxyCODONE-acetaminophen (PERCOCET/ROXICET) 5-325 MG per tablet 1 tablet (1 tablet Oral Given 07/11/16 0640)  indomethacin (INDOCIN) capsule 50 mg (50 mg Oral Given 07/11/16 0640)  dexamethasone (DECADRON) injection 10 mg (10 mg Intramuscular Given 07/11/16 0641)   Initial Impression / Assessment and Plan / ED Course  I have reviewed the triage vital signs and the nursing notes.  Pertinent labs & imaging results that were available during my care of the patient were reviewed by me and considered in my medical decision making (see chart for details).  Final Clinical Impressions(s) / ED Diagnoses   {I have reviewed and evaluated the relevant imaging studies.  {I have reviewed the relevant previous healthcare records.  {I obtained HPI from historian.   ED Course:  Assessment: Pt presents with monoarticular pain, swelling and erythema.  No fevers. Pt is afebrile and stable. Imaging reviewed, no evidence of occult fracture or injury. Pt without known peptic ulcer disease and not receiving concurrent treatment on warfarin. Pt dc with indomethacin (50 mg PO TID). Discussed that pt should respond to treatment with in 24 hour of begining treatment & likely resolve in 2-3 days.   Disposition/Plan:  DC Home Additional Verbal discharge instructions given and discussed with patient.  Pt Instructed to f/u with PCP in the next week for evaluation and treatment of symptoms. Return  precautions given Pt acknowledges and agrees with plan  Supervising Physician [place], DO  Final diagnoses:  Acute gout of left ankle, unspecified cause  Acute gout involving toe of right foot, unspecified cause    New Prescriptions New Prescriptions   No medications on file     Shary Decamp, PA-C 07/11/16 [place], DO 07/11/16 [id number]

## 2016-07-12 LAB — COMPREHENSIVE METABOLIC PANEL
ALK PHOS: 117 IU/L (ref 39–117)
ALT: 28 IU/L (ref 0–44)
AST: 28 IU/L (ref 0–40)
Albumin/Globulin Ratio: 1 — ABNORMAL LOW (ref 1.2–2.2)
Albumin: 4 g/dL (ref 3.5–5.5)
BILIRUBIN TOTAL: 1.2 mg/dL (ref 0.0–1.2)
BUN/Creatinine Ratio: 15 (ref 9–20)
BUN: 25 mg/dL — AB (ref 6–24)
CHLORIDE: 90 mmol/L — AB (ref 96–106)
CO2: 28 mmol/L (ref 18–29)
Calcium: 9.5 mg/dL (ref 8.7–10.2)
Creatinine, Ser: 1.67 mg/dL — ABNORMAL HIGH (ref 0.76–1.27)
GFR calc Af Amer: 54 mL/min/{1.73_m2} — ABNORMAL LOW (ref >59)
GFR calc non Af Amer: 46 mL/min/{1.73_m2} — ABNORMAL LOW (ref >59)
GLUCOSE: 237 mg/dL — AB (ref 65–99)
Globulin, Total: 3.9 g/dL (ref 1.5–4.5)
Potassium: 3.9 mmol/L (ref 3.5–5.2)
Sodium: 131 mmol/L — ABNORMAL LOW (ref 134–144)
Total Protein: 7.9 g/dL (ref 6.0–8.5)

## 2016-07-12 LAB — CBC WITH DIFFERENTIAL/PLATELET
Basophils Absolute: 0 10*3/uL (ref 0.0–0.2)
Basos: 0 %
EOS (ABSOLUTE): 0 10*3/uL (ref 0.0–0.4)
Eos: 0 %
HEMOGLOBIN: 14.5 g/dL (ref 13.0–17.7)
Hematocrit: 43 % (ref 37.5–51.0)
IMMATURE GRANS (ABS): 0 10*3/uL (ref 0.0–0.1)
Immature Granulocytes: 0 %
LYMPHS ABS: 0.7 10*3/uL (ref 0.7–3.1)
LYMPHS: 4 %
MCH: 30 pg (ref 26.6–33.0)
MCHC: 33.7 g/dL (ref 31.5–35.7)
MCV: 89 fL (ref 79–97)
MONOCYTES: 2 %
Monocytes Absolute: 0.3 10*3/uL (ref 0.1–0.9)
Neutrophils Absolute: 15.9 10*3/uL — ABNORMAL HIGH (ref 1.4–7.0)
Neutrophils: 94 %
Platelets: 223 10*3/uL (ref 150–379)
RBC: 4.84 x10E6/uL (ref 4.14–5.80)
RDW: 13.8 % (ref 12.3–15.4)
WBC: 16.9 10*3/uL — AB (ref 3.4–10.8)

## 2016-07-12 LAB — URIC ACID: URIC ACID: 8.6 mg/dL (ref 3.7–8.6)

## 2016-07-19 ENCOUNTER — Telehealth: Payer: Self-pay

## 2016-07-19 NOTE — Telephone Encounter (Signed)
Pt returned call and is aware of results. Pt states he feel like his foot is on fire in certain spots but he has been moving around and hopefully it will get better in time

## 2016-07-19 NOTE — Telephone Encounter (Signed)
Contacted pt to go over lab results pt didn't answer. Lvm asking pt to give me a call at his earliest convenience   If pt calls back please give results: There were several abnormalities in bloodwork that should improve as he takes the medication for his blood sugar and gout. He should return to the clinic or ED if he worsens. As long as he is improving with his pain, he should check sugars and BP as discussed and follow up as planned.

## 2016-08-02 ENCOUNTER — Encounter: Payer: Self-pay | Admitting: Family Medicine

## 2016-08-02 ENCOUNTER — Ambulatory Visit: Payer: Self-pay | Attending: Family Medicine | Admitting: Family Medicine

## 2016-08-02 VITALS — BP 142/90 | HR 86 | Temp 98.0°F | Resp 16 | Ht 73.0 in | Wt 227.0 lb

## 2016-08-02 DIAGNOSIS — I129 Hypertensive chronic kidney disease with stage 1 through stage 4 chronic kidney disease, or unspecified chronic kidney disease: Secondary | ICD-10-CM | POA: Insufficient documentation

## 2016-08-02 DIAGNOSIS — N182 Chronic kidney disease, stage 2 (mild): Secondary | ICD-10-CM | POA: Insufficient documentation

## 2016-08-02 DIAGNOSIS — Z79899 Other long term (current) drug therapy: Secondary | ICD-10-CM | POA: Insufficient documentation

## 2016-08-02 DIAGNOSIS — I10 Essential (primary) hypertension: Secondary | ICD-10-CM

## 2016-08-02 DIAGNOSIS — E1122 Type 2 diabetes mellitus with diabetic chronic kidney disease: Secondary | ICD-10-CM | POA: Insufficient documentation

## 2016-08-02 DIAGNOSIS — M109 Gout, unspecified: Secondary | ICD-10-CM | POA: Insufficient documentation

## 2016-08-02 DIAGNOSIS — N189 Chronic kidney disease, unspecified: Secondary | ICD-10-CM | POA: Insufficient documentation

## 2016-08-02 DIAGNOSIS — E1140 Type 2 diabetes mellitus with diabetic neuropathy, unspecified: Secondary | ICD-10-CM | POA: Insufficient documentation

## 2016-08-02 DIAGNOSIS — J4520 Mild intermittent asthma, uncomplicated: Secondary | ICD-10-CM | POA: Insufficient documentation

## 2016-08-02 DIAGNOSIS — Z7984 Long term (current) use of oral hypoglycemic drugs: Secondary | ICD-10-CM | POA: Insufficient documentation

## 2016-08-02 DIAGNOSIS — M10471 Other secondary gout, right ankle and foot: Secondary | ICD-10-CM

## 2016-08-02 DIAGNOSIS — E1149 Type 2 diabetes mellitus with other diabetic neurological complication: Secondary | ICD-10-CM

## 2016-08-02 DIAGNOSIS — E119 Type 2 diabetes mellitus without complications: Secondary | ICD-10-CM

## 2016-08-02 LAB — POCT CBG (FASTING - GLUCOSE)-MANUAL ENTRY: Glucose Fasting, POC: 125 mg/dL — AB (ref 70–99)

## 2016-08-02 MED ORDER — TRAMADOL HCL 50 MG PO TABS: 50.0000 mg | ORAL_TABLET | Freq: Three times a day (TID) | ORAL | 0 refills | Status: DC | PRN

## 2016-08-02 MED ORDER — ALLOPURINOL 300 MG PO TABS: 300.0000 mg | ORAL_TABLET | Freq: Every day | ORAL | 6 refills | Status: DC

## 2016-08-02 MED ORDER — METFORMIN HCL 1000 MG PO TABS: 1000.0000 mg | ORAL_TABLET | Freq: Two times a day (BID) | ORAL | 1 refills | Status: DC

## 2016-08-02 MED ORDER — ALBUTEROL SULFATE 108 (90 BASE) MCG/ACT IN AEPB: 2.0000 | INHALATION_SPRAY | Freq: Four times a day (QID) | RESPIRATORY_TRACT | 3 refills | Status: DC | PRN

## 2016-08-02 MED ORDER — ATORVASTATIN CALCIUM 20 MG PO TABS: 20.0000 mg | ORAL_TABLET | Freq: Every day | ORAL | 1 refills | Status: DC

## 2016-08-02 MED ORDER — PREDNISONE 20 MG PO TABS: 20.0000 mg | ORAL_TABLET | Freq: Two times a day (BID) | ORAL | 0 refills | Status: DC

## 2016-08-02 MED ORDER — AMLODIPINE BESYLATE 10 MG PO TABS: 10.0000 mg | ORAL_TABLET | Freq: Every day | ORAL | 1 refills | Status: DC

## 2016-08-02 MED ORDER — LISINOPRIL 20 MG PO TABS: 20.0000 mg | ORAL_TABLET | Freq: Every day | ORAL | 1 refills | Status: DC

## 2016-08-02 MED FILL — ?METFORMIN HCL 1,000 MG TAB: 1000 | 60 days supply | Qty: 60 | Fill #0

## 2016-08-02 MED FILL — ?PREDNISONE 20 MG TABLET: 20 | 5 days supply | Qty: 10 | Fill #0

## 2016-08-02 MED FILL — LISINOPRIL 20 MG TABLET: 20 | 30 days supply | Qty: 30 | Fill #0

## 2016-08-02 MED FILL — ATORVASTATIN 20 MG TABLET: 20 | 30 days supply | Qty: 30 | Fill #0

## 2016-08-02 MED FILL — AMLODIPINE BESYLATE 10 MG T: 10 | 30 days supply | Qty: 30 | Fill #0

## 2016-08-02 MED FILL — PROAIR RESPICLICK INHAL PWD: 108 (90 BAS | 23 days supply | Qty: 1 | Fill #0

## 2016-08-02 NOTE — Progress Notes (Signed)
F/U Gout  Pain scale #8 Tobacco user 15 cigarette per day DM Glucose running at home between 125-270

## 2016-08-02 NOTE — Patient Instructions (Signed)

## 2016-08-02 NOTE — Progress Notes (Signed)
Subjective:  Patient ID: Craig West, male    DOB: 1965-07-10  Age: 51 y.o. MRN: [id number]  CC: follow up  HPI Craig West is a 51 year old male with a history of hypertension, type 2 diabetes mellitus (A1c 6.6, asthma, gout who presents today for a follow-up visit.  He complains of gout flares for the last 1 month and was seen by the physician assistant 3 weeks ago where he was placed on indomethacin with no much improvement in symptoms. He complains of pain and swelling of his right great toe and lately his left great toe. He tries to avoid beef and shellfish the best he can but is not currently on any prophylactic medications.  With regards to his diabetes he has been compliant with his medications and a diabetic diet and he has also been taking his antihypertensives. Denies recent asthma flares. Will need refills on medications.  Past Medical History:  Diagnosis Date  . Asthma   . Diabetes mellitus without complication ([place])   . Gout   . Hypertension     No past surgical history on file.  No Known Allergies   Outpatient Medications Prior to Visit  Medication Sig Dispense Refill  . Blood Glucose Monitoring Suppl (TRUE METRIX METER) w/Device KIT Check blood sugar fasting and ant bedtime and record 1 kit 0  . glucose blood (TRUE METRIX BLOOD GLUCOSE TEST) test strip Use as instructed 100 each 12  . TRUEPLUS LANCETS 28G MISC Check blood sugar fasting and at bedtime 100 each 0  . Albuterol Sulfate (PROAIR RESPICLICK) 540 (90 Base) MCG/ACT AEPB Inhale 2 puffs into the lungs every 6 (six) hours as needed. (Patient taking differently: Inhale 2 puffs into the lungs every 6 (six) hours as needed (shortness of breath). ) 1 each 3  . amLODipine (NORVASC) 10 MG tablet Take 1 tablet (10 mg total) by mouth daily. 90 tablet 3  . cyclobenzaprine (FLEXERIL) 5 MG tablet Take 1 tablet (5 mg total) by mouth 2 (two) times daily as needed for muscle spasms. 10 tablet 0  . lisinopril  (PRINIVIL,ZESTRIL) 20 MG tablet Take 1 tablet (20 mg total) by mouth daily. 90 tablet 3  . metFORMIN (GLUCOPHAGE) 1000 MG tablet Take 1 tablet (1,000 mg total) by mouth 2 (two) times daily with a meal. 180 tablet 3  . naproxen (NAPROSYN) 500 MG tablet Take 1 tablet (500 mg total) by mouth 2 (two) times daily. Limit use to 3-5 days. 15 tablet 0   No facility-administered medications prior to visit.     ROS Review of Systems  Constitutional: Negative for activity change and appetite change.  HENT: Negative for sinus pressure and sore throat.   Eyes: Negative for visual disturbance.  Respiratory: Negative for cough, chest tightness and shortness of breath.   Cardiovascular: Negative for chest pain and leg swelling.  Gastrointestinal: Negative for abdominal distention, abdominal pain, constipation and diarrhea.  Endocrine: Negative.   Genitourinary: Negative for dysuria.  Musculoskeletal: Negative for joint swelling and myalgias.       See hpi    Skin: Negative for rash.  Allergic/Immunologic: Negative.   Neurological: Negative for weakness, light-headedness and numbness.  Psychiatric/Behavioral: Negative for dysphoric mood and suicidal ideas.    Objective:  BP (!) 142/90 (BP Location: Right Arm, Patient Position: Sitting, Cuff Size: Large)   Pulse 86   Temp 98 F (36.7 C) (Oral)   Resp 16   Ht 6' 1"  (1.854 m)   Wt 227 lb (103 kg)  SpO2 100%   BMI 29.95 kg/m   BP/Weight 08/02/2016 07/11/2016 0/25/8527  Systolic BP 782 423 536  Diastolic BP 90 144 315  Wt. (Lbs) 227 - -  BMI 29.95 - -      Physical Exam  Constitutional: He is oriented to person, place, and time. He appears well-developed and well-nourished.  Cardiovascular: Normal rate, normal heart sounds and intact distal pulses.   No murmur heard. Pulmonary/Chest: Effort normal and breath sounds normal. He has no wheezes. He has no rales. He exhibits no tenderness.  Abdominal: Soft. Bowel sounds are normal. He  exhibits no distension and no mass. There is no tenderness.  Musculoskeletal: He exhibits edema (Edema of the right great toe) and tenderness (tenderness on palpation of medial aspect of the great toes of both feet).  Neurological: He is alert and oriented to person, place, and time.  Skin: Skin is warm and dry.  Psychiatric: He has a normal mood and affect.     Lab Results  Component Value Date   HGBA1C 6.6 07/11/2016    CMP Latest Ref Rng & Units 07/11/2016 10/23/2015 03/29/2015  Glucose 65 - 99 mg/dL 237(H) 124(H) 171(H)  BUN 6 - 24 mg/dL 25(H) 15 19  Creatinine 0.76 - 1.27 mg/dL 1.67(H) 1.18 1.48(H)  Sodium 134 - 144 mmol/L 131(L) 138 140  Potassium 3.5 - 5.2 mmol/L 3.9 4.4 4.3  Chloride 96 - 106 mmol/L 90(L) 102 101  CO2 18 - 29 mmol/L 28 28 22   Calcium 8.7 - 10.2 mg/dL 9.5 9.1 8.9  Total Protein 6.0 - 8.5 g/dL 7.9 7.2 6.9  Total Bilirubin 0.0 - 1.2 mg/dL 1.2 0.8 0.3  Alkaline Phos 39 - 117 IU/L 117 96 139(H)  AST 0 - 40 IU/L 28 44(H) 50(H)  ALT 0 - 44 IU/L 28 64(H) 70(H)    Assessment & Plan:   1. Type 2 diabetes mellitus without complication, without long-term current use of insulin (HCC) Controlled with A1c of 6.6 Continue current medications Commenced on statin for cardiovascular benefit - Glucose (CBG), Fasting - metFORMIN (GLUCOPHAGE) 1000 MG tablet; Take 1 tablet (1,000 mg total) by mouth 2 (two) times daily with a meal.  Dispense: 180 tablet; Refill: 1 - atorvastatin (LIPITOR) 20 MG tablet; Take 1 tablet (20 mg total) by mouth daily.  Dispense: 90 tablet; Refill: 1  2. Essential hypertension Slightly elevated above goal of less than 130/80 Slight elevation could be attributed to pain Low-sodium diet - amLODipine (NORVASC) 10 MG tablet; Take 1 tablet (10 mg total) by mouth daily.  Dispense: 90 tablet; Refill: 1 - lisinopril (PRINIVIL,ZESTRIL) 20 MG tablet; Take 1 tablet (20 mg total) by mouth daily.  Dispense: 90 tablet; Refill: 1  3. Acute gout due to other  secondary cause involving toe of right foot We'll treat acute gout with prednisone and tramadol; holding off on colchicine due to renal function Commence allopurinol once acute flare has resolved Discussed dietary changes to prevent frequent flareups - predniSONE (DELTASONE) 20 MG tablet; Take 1 tablet (20 mg total) by mouth 2 (two) times daily with a meal.  Dispense: 10 tablet; Refill: 0 - traMADol (ULTRAM) 50 MG tablet; Take 1 tablet (50 mg total) by mouth every 8 (eight) hours as needed.  Dispense: 30 tablet; Refill: 0  4. Mild intermittent asthma without complication Stable - Albuterol Sulfate (PROAIR RESPICLICK) 400 (90 Base) MCG/ACT AEPB; Inhale 2 puffs into the lungs every 6 (six) hours as needed (shortness of breath).  Dispense: 1 each; Refill:  3  5. Other diabetic neurological complication associated with type 2 diabetes mellitus ([place]) Discuss initiation of gabapentin which he declines at this time  6. Stage 2 chronic kidney disease Avoid NSAIDs Baseline creatinine 1.4-1.5   Meds ordered this encounter  Medications  . Albuterol Sulfate (PROAIR RESPICLICK) 221 (90 Base) MCG/ACT AEPB    Sig: Inhale 2 puffs into the lungs every 6 (six) hours as needed (shortness of breath).    Dispense:  1 each    Refill:  3  . amLODipine (NORVASC) 10 MG tablet    Sig: Take 1 tablet (10 mg total) by mouth daily.    Dispense:  90 tablet    Refill:  1  . lisinopril (PRINIVIL,ZESTRIL) 20 MG tablet    Sig: Take 1 tablet (20 mg total) by mouth daily.    Dispense:  90 tablet    Refill:  1  . metFORMIN (GLUCOPHAGE) 1000 MG tablet    Sig: Take 1 tablet (1,000 mg total) by mouth 2 (two) times daily with a meal.    Dispense:  180 tablet    Refill:  1  . predniSONE (DELTASONE) 20 MG tablet    Sig: Take 1 tablet (20 mg total) by mouth 2 (two) times daily with a meal.    Dispense:  10 tablet    Refill:  0  . traMADol (ULTRAM) 50 MG tablet    Sig: Take 1 tablet (50 mg total) by mouth every 8 (eight)  hours as needed.    Dispense:  30 tablet    Refill:  0  . allopurinol (ZYLOPRIM) 300 MG tablet    Sig: Take 1 tablet (300 mg total) by mouth daily.    Dispense:  30 tablet    Refill:  6  . atorvastatin (LIPITOR) 20 MG tablet    Sig: Take 1 tablet (20 mg total) by mouth daily.    Dispense:  90 tablet    Refill:  1    Follow-up: Return in about 1 month (around 09/02/2016) for Follow-up on gout.   Arnoldo Morale MD

## 2016-08-07 ENCOUNTER — Emergency Department (HOSPITAL_COMMUNITY): Payer: No Typology Code available for payment source

## 2016-08-07 ENCOUNTER — Encounter (HOSPITAL_COMMUNITY): Payer: Self-pay | Admitting: Emergency Medicine

## 2016-08-07 ENCOUNTER — Emergency Department (HOSPITAL_COMMUNITY)
Admission: EM | Admit: 2016-08-07 | Discharge: 2016-08-07 | Disposition: A | Discharge status: Home / Self Care | Payer: No Typology Code available for payment source | Attending: Emergency Medicine | Admitting: Emergency Medicine

## 2016-08-07 DIAGNOSIS — J45909 Unspecified asthma, uncomplicated: Secondary | ICD-10-CM | POA: Insufficient documentation

## 2016-08-07 DIAGNOSIS — I10 Essential (primary) hypertension: Secondary | ICD-10-CM | POA: Insufficient documentation

## 2016-08-07 DIAGNOSIS — Y92410 Unspecified street and highway as the place of occurrence of the external cause: Secondary | ICD-10-CM | POA: Diagnosis not present

## 2016-08-07 DIAGNOSIS — Z79899 Other long term (current) drug therapy: Secondary | ICD-10-CM | POA: Diagnosis not present

## 2016-08-07 DIAGNOSIS — E1140 Type 2 diabetes mellitus with diabetic neuropathy, unspecified: Secondary | ICD-10-CM | POA: Diagnosis not present

## 2016-08-07 DIAGNOSIS — S161XXA Strain of muscle, fascia and tendon at neck level, initial encounter: Secondary | ICD-10-CM | POA: Diagnosis not present

## 2016-08-07 DIAGNOSIS — S199XXA Unspecified injury of neck, initial encounter: Secondary | ICD-10-CM | POA: Diagnosis present

## 2016-08-07 DIAGNOSIS — F17210 Nicotine dependence, cigarettes, uncomplicated: Secondary | ICD-10-CM | POA: Diagnosis not present

## 2016-08-07 DIAGNOSIS — Y999 Unspecified external cause status: Secondary | ICD-10-CM | POA: Diagnosis not present

## 2016-08-07 DIAGNOSIS — M549 Dorsalgia, unspecified: Secondary | ICD-10-CM | POA: Insufficient documentation

## 2016-08-07 DIAGNOSIS — Y9389 Activity, other specified: Secondary | ICD-10-CM | POA: Diagnosis not present

## 2016-08-07 DIAGNOSIS — Z7984 Long term (current) use of oral hypoglycemic drugs: Secondary | ICD-10-CM | POA: Diagnosis not present

## 2016-08-07 MED ORDER — IBUPROFEN 800 MG PO TABS
800.0000 mg | ORAL_TABLET | Freq: Once | ORAL | Status: AC
  Administered 2016-08-07: 800 mg via ORAL
  Filled 2016-08-07: qty 1

## 2016-08-07 MED ORDER — METHOCARBAMOL 500 MG PO TABS: 500.0000 mg | ORAL_TABLET | Freq: Three times a day (TID) | ORAL | 0 refills | Status: DC | PRN

## 2016-08-07 MED ORDER — HYDROCODONE-ACETAMINOPHEN 5-325 MG PO TABS
1.0000 | ORAL_TABLET | Freq: Once | ORAL | Status: AC
Start: 2016-08-07 — End: 2016-08-07
  Administered 2016-08-07: 1 via ORAL
  Filled 2016-08-07: qty 1

## 2016-08-07 MED ORDER — HYDROCODONE-ACETAMINOPHEN 5-325 MG PO TABS: 1.0000 | ORAL_TABLET | ORAL | 0 refills | Status: DC | PRN

## 2016-08-07 MED ORDER — METHOCARBAMOL 500 MG PO TABS
1000.0000 mg | ORAL_TABLET | Freq: Once | ORAL | Status: AC
  Administered 2016-08-07: 1000 mg via ORAL
  Filled 2016-08-07: qty 2

## 2016-08-07 MED ORDER — IBUPROFEN 800 MG PO TABS: 800.0000 mg | ORAL_TABLET | Freq: Three times a day (TID) | ORAL | 0 refills | Status: DC
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
## 2016-08-07 NOTE — ED Notes (Signed)
Patient states  His pain presently is 8/10 in his neck and back. Moves around in room without difficulty.
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
## 2016-08-07 NOTE — Discharge Instructions (Signed)
Ice to sore spots today.

## 2016-09-10 MED FILL — ALLOPURINOL 300 MG TABLET: 300 | 30 days supply | Qty: 30 | Fill #0

## 2016-09-12 ENCOUNTER — Ambulatory Visit: Payer: Self-pay | Admitting: Family Medicine

## 2016-09-16 ENCOUNTER — Ambulatory Visit: Payer: Self-pay | Admitting: Family Medicine

## 2016-09-24 MED FILL — AMLODIPINE BESYLATE 10 MG T: 10 | 30 days supply | Qty: 30 | Fill #1

## 2016-09-24 MED FILL — ALLOPURINOL 300 MG TABLET: 300 | 30 days supply | Qty: 30 | Fill #1

## 2016-09-24 MED FILL — ATORVASTATIN 20 MG TABLET: 20 | 30 days supply | Qty: 30 | Fill #1

## 2016-09-24 MED FILL — ?LISINOPRIL 20 MG TABLET: 20 | 30 days supply | Qty: 30 | Fill #1

## 2016-09-24 MED FILL — ?METFORMIN HCL 1,000 MG TAB: 1000 | 60 days supply | Qty: 60 | Fill #1

## 2016-09-25 ENCOUNTER — Ambulatory Visit: Payer: Self-pay | Attending: Family Medicine | Admitting: Family Medicine

## 2016-09-25 ENCOUNTER — Encounter: Payer: Self-pay | Admitting: Family Medicine

## 2016-09-25 VITALS — BP 143/80 | HR 72 | Temp 98.3°F | Wt 230.2 lb

## 2016-09-25 DIAGNOSIS — M79674 Pain in right toe(s): Secondary | ICD-10-CM | POA: Insufficient documentation

## 2016-09-25 DIAGNOSIS — M109 Gout, unspecified: Secondary | ICD-10-CM | POA: Insufficient documentation

## 2016-09-25 DIAGNOSIS — I10 Essential (primary) hypertension: Secondary | ICD-10-CM | POA: Insufficient documentation

## 2016-09-25 DIAGNOSIS — J45909 Unspecified asthma, uncomplicated: Secondary | ICD-10-CM | POA: Insufficient documentation

## 2016-09-25 DIAGNOSIS — M10471 Other secondary gout, right ankle and foot: Secondary | ICD-10-CM

## 2016-09-25 DIAGNOSIS — E119 Type 2 diabetes mellitus without complications: Secondary | ICD-10-CM | POA: Insufficient documentation

## 2016-09-25 DIAGNOSIS — Z7984 Long term (current) use of oral hypoglycemic drugs: Secondary | ICD-10-CM | POA: Insufficient documentation

## 2016-09-25 LAB — GLUCOSE, POCT (MANUAL RESULT ENTRY): POC GLUCOSE: 137 mg/dL — AB (ref 70–99)

## 2016-09-25 MED ORDER — COLCHICINE 0.6 MG PO TABS: ORAL_TABLET | ORAL | 2 refills | Status: DC

## 2016-09-25 MED FILL — COLCHICINE 0.6 MG TABLET: 0.6 | 30 days supply | Qty: 60 | Fill #0

## 2016-09-25 NOTE — Patient Instructions (Signed)

## 2016-09-25 NOTE — Progress Notes (Signed)
Subjective:  Patient ID: Craig West, male    DOB: 01-Jul-1965  Age: 51 y.o. MRN: [id number]  CC: Hypertension; Diabetes; and Gout   HPI Craig West  is a 51 year old male with a history of hypertension, type 2 diabetes mellitus (A1c 6.6), asthma, gout who presents today for a follow-up visit.  At his last visit with me he had a gout flare in his right big toe which he states has resolved at this time. Pain in his right big toe is a 2/10 and he has been compliant with allopurinol and has been trying to follow a low purine diet which he states is difficult  He has no acute concerns at this time.  Past Medical History:  Diagnosis Date  . Asthma   . Diabetes mellitus without complication ([place])   . Gout   . Hypertension     No past surgical history on file.  No Known Allergies   Outpatient Medications Prior to Visit  Medication Sig Dispense Refill  . Albuterol Sulfate (PROAIR RESPICLICK) 588 (90 Base) MCG/ACT AEPB Inhale 2 puffs into the lungs every 6 (six) hours as needed (shortness of breath). 1 each 3  . allopurinol (ZYLOPRIM) 300 MG tablet Take 1 tablet (300 mg total) by mouth daily. 30 tablet 6  . amLODipine (NORVASC) 10 MG tablet Take 1 tablet (10 mg total) by mouth daily. 90 tablet 1  . atorvastatin (LIPITOR) 20 MG tablet Take 1 tablet (20 mg total) by mouth daily. 90 tablet 1  . Blood Glucose Monitoring Suppl (TRUE METRIX METER) w/Device KIT Check blood sugar fasting and ant bedtime and record 1 kit 0  . glucose blood (TRUE METRIX BLOOD GLUCOSE TEST) test strip Use as instructed 100 each 12  . ibuprofen (ADVIL,MOTRIN) 800 MG tablet Take 1 tablet (800 mg total) by mouth 3 (three) times daily. 21 tablet 0  . lisinopril (PRINIVIL,ZESTRIL) 20 MG tablet Take 1 tablet (20 mg total) by mouth daily. 90 tablet 1  . metFORMIN (GLUCOPHAGE) 1000 MG tablet Take 1 tablet (1,000 mg total) by mouth 2 (two) times daily with a meal. 180 tablet 1  . methocarbamol (ROBAXIN) 500 MG tablet Take  1 tablet (500 mg total) by mouth 3 (three) times daily between meals as needed. 20 tablet 0  . predniSONE (DELTASONE) 20 MG tablet Take 1 tablet (20 mg total) by mouth 2 (two) times daily with a meal. 10 tablet 0  . traMADol (ULTRAM) 50 MG tablet Take 1 tablet (50 mg total) by mouth every 8 (eight) hours as needed. 30 tablet 0  . TRUEPLUS LANCETS 28G MISC Check blood sugar fasting and at bedtime 100 each 0  . HYDROcodone-acetaminophen (NORCO/VICODIN) 5-325 MG tablet Take 1 tablet by mouth every 4 (four) hours as needed. (Patient not taking: Reported on 09/25/2016) 4 tablet 0   No facility-administered medications prior to visit.     ROS Review of Systems  Constitutional: Negative for activity change and appetite change.  HENT: Negative for sinus pressure and sore throat.   Eyes: Negative for visual disturbance.  Respiratory: Negative for cough, chest tightness and shortness of breath.   Cardiovascular: Negative for chest pain and leg swelling.  Gastrointestinal: Negative for abdominal distention, abdominal pain, constipation and diarrhea.  Endocrine: Negative.   Genitourinary: Negative for dysuria.  Musculoskeletal:       See hpi  Skin: Negative for rash.  Allergic/Immunologic: Negative.   Neurological: Negative for weakness, light-headedness and numbness.  Psychiatric/Behavioral: Negative for dysphoric mood and suicidal  ideas.    Objective:  BP (!) 143/80   Pulse 72   Temp 98.3 F (36.8 C) (Oral)   Wt 230 lb 3.2 oz (104.4 kg)   SpO2 99%   BMI 30.37 kg/m   BP/Weight 09/25/2016 05/07/4156 3/0/9407  Systolic BP 680 881 103  Diastolic BP 80 87 90  Wt. (Lbs) 230.2 - 227  BMI 30.37 - 29.95      Physical Exam  Constitutional: He is oriented to person, place, and time. He appears well-developed and well-nourished.  Cardiovascular: Normal rate, normal heart sounds and intact distal pulses.   No murmur heard. Pulmonary/Chest: Effort normal and breath sounds normal. He has no  wheezes. He has no rales. He exhibits no tenderness.  Abdominal: Soft. Bowel sounds are normal. He exhibits no distension and no mass. There is no tenderness.  Musculoskeletal: Normal range of motion.  Neurological: He is alert and oriented to person, place, and time.    Lab Results  Component Value Date   HGBA1C 6.6 07/11/2016    Assessment & Plan:   1. Type 2 diabetes mellitus without complication, without long-term current use of insulin (HCC) Controlled with A1c of 6.6 - POCT glucose (manual entry)  2. Acute gout due to other secondary cause involving toe of right foot No acute flare - colchicine 0.6 MG tablet; Take 2 tablets (1.2 mg) orally at the onset of a gout attack; may repeat 1 tablet (0.6 mg) in 2 hours if symptoms persist  Dispense: 60 tablet; Refill: 2   Meds ordered this encounter  Medications  . colchicine 0.6 MG tablet    Sig: Take 2 tablets (1.2 mg) orally at the onset of a gout attack; may repeat 1 tablet (0.6 mg) in 2 hours if symptoms persist    Dispense:  60 tablet    Refill:  2    Follow-up: Return in about 3 months (around 12/26/2016) for Follow-up on chronic medical conditions.   Arnoldo Morale MD

## 2016-11-14 MED FILL — AMLODIPINE BESYLATE 10 MG T: 10 | 30 days supply | Qty: 30 | Fill #2

## 2016-11-14 MED FILL — ATORVASTATIN 20 MG TABLET: 20 | 30 days supply | Qty: 30 | Fill #2

## 2016-11-14 MED FILL — LISINOPRIL 20 MG TAB: 20 | 30 days supply | Qty: 30 | Fill #2

## 2016-11-14 MED FILL — ?ALLOPURINOL 300 MG TABLET: 300 | 30 days supply | Qty: 30 | Fill #2

## 2016-11-20 MED FILL — TRUE METRIX TEST STRIP: 25 days supply | Qty: 100 | Fill #1

## 2016-12-27 ENCOUNTER — Ambulatory Visit: Payer: Self-pay | Admitting: Family Medicine

## 2017-01-08 ENCOUNTER — Ambulatory Visit: Payer: Self-pay | Attending: Family Medicine | Admitting: Family Medicine

## 2017-01-08 ENCOUNTER — Encounter: Payer: Self-pay | Admitting: Family Medicine

## 2017-01-08 VITALS — BP 180/120 | HR 84 | Temp 98.3°F | Ht 73.0 in | Wt 230.2 lb

## 2017-01-08 DIAGNOSIS — E7800 Pure hypercholesterolemia, unspecified: Secondary | ICD-10-CM | POA: Insufficient documentation

## 2017-01-08 DIAGNOSIS — I10 Essential (primary) hypertension: Secondary | ICD-10-CM | POA: Insufficient documentation

## 2017-01-08 DIAGNOSIS — E1149 Type 2 diabetes mellitus with other diabetic neurological complication: Secondary | ICD-10-CM

## 2017-01-08 DIAGNOSIS — M1A00X0 Idiopathic chronic gout, unspecified site, without tophus (tophi): Secondary | ICD-10-CM | POA: Insufficient documentation

## 2017-01-08 DIAGNOSIS — Z23 Encounter for immunization: Secondary | ICD-10-CM | POA: Insufficient documentation

## 2017-01-08 DIAGNOSIS — Z7984 Long term (current) use of oral hypoglycemic drugs: Secondary | ICD-10-CM | POA: Insufficient documentation

## 2017-01-08 DIAGNOSIS — Z1159 Encounter for screening for other viral diseases: Secondary | ICD-10-CM

## 2017-01-08 DIAGNOSIS — J45909 Unspecified asthma, uncomplicated: Secondary | ICD-10-CM | POA: Insufficient documentation

## 2017-01-08 DIAGNOSIS — E119 Type 2 diabetes mellitus without complications: Secondary | ICD-10-CM | POA: Insufficient documentation

## 2017-01-08 LAB — GLUCOSE, POCT (MANUAL RESULT ENTRY): POC GLUCOSE: 115 mg/dL — AB (ref 70–99)

## 2017-01-08 LAB — POCT GLYCOSYLATED HEMOGLOBIN (HGB A1C): HEMOGLOBIN A1C: 5.7

## 2017-01-08 MED ORDER — METFORMIN HCL 1000 MG PO TABS: 1000.0000 mg | ORAL_TABLET | Freq: Two times a day (BID) | ORAL | 1 refills | Status: DC

## 2017-01-08 MED ORDER — ALBUTEROL SULFATE HFA 108 (90 BASE) MCG/ACT IN AERS: 2.0000 | INHALATION_SPRAY | Freq: Four times a day (QID) | RESPIRATORY_TRACT | 2 refills | Status: DC | PRN

## 2017-01-08 MED ORDER — AMLODIPINE BESYLATE 10 MG PO TABS: 10.0000 mg | ORAL_TABLET | Freq: Every day | ORAL | 1 refills | Status: DC

## 2017-01-08 MED ORDER — COLCHICINE 0.6 MG PO TABS: ORAL_TABLET | ORAL | 2 refills | Status: DC

## 2017-01-08 MED ORDER — GABAPENTIN 300 MG PO CAPS: 300.0000 mg | ORAL_CAPSULE | Freq: Two times a day (BID) | ORAL | 5 refills | Status: DC

## 2017-01-08 MED ORDER — ATORVASTATIN CALCIUM 20 MG PO TABS: 20.0000 mg | ORAL_TABLET | Freq: Every day | ORAL | 1 refills | Status: DC

## 2017-01-08 MED ORDER — LISINOPRIL 20 MG PO TABS: 20.0000 mg | ORAL_TABLET | Freq: Every day | ORAL | 1 refills | Status: DC

## 2017-01-08 MED ORDER — ALLOPURINOL 300 MG PO TABS: 300.0000 mg | ORAL_TABLET | Freq: Every day | ORAL | 6 refills | Status: DC

## 2017-01-08 MED ORDER — CLONIDINE HCL 0.1 MG PO TABS
0.2000 mg | ORAL_TABLET | Freq: Once | ORAL | Status: AC
  Administered 2017-01-08: 0.2 mg via ORAL

## 2017-01-08 NOTE — Progress Notes (Signed)
No BP meds for 2 weeks.

## 2017-01-08 NOTE — Patient Instructions (Signed)

## 2017-01-08 NOTE — Progress Notes (Signed)
Subjective:  Patient ID: Craig West, male    DOB: 1966-01-03  Age: 51 y.o. MRN: [id number]  CC: Diabetes and Hypertension   HPI Craig West is a 51 year old male with a history of hypertension, type 2 diabetes mellitus (A1c 5.7), asthma, gout who presents today for a follow-up visit.  With regards to his diabetes he denies hypoglycemia or visual concerns but endorses numbness in both feet but none in his hands.  His blood pressure is significantly elevated today at 180/120 and clonidine 0.2 mg has been administered; he reports being out of his antihypertensives for the last 2 weeks.  Gout has been stable with no recent flares and he has had no recent asthma exacerbations. He is requesting refills of all his medications today and is willing to received a flu shot.  He has experienced urinary frequency and endorses drinking about 12 beers. On suggesting a rectal exam to exclude BPH he declines at this time and states he will do it at his next visit. He will try to cut back on his beer intake and if symptoms do not improve we will consider treating for BPH.  Past Medical History:  Diagnosis Date  . Asthma   . Diabetes mellitus without complication ([place])   . Gout   . Hypertension     No past surgical history on file.  No Known Allergies   Outpatient Medications Prior to Visit  Medication Sig Dispense Refill  . Blood Glucose Monitoring Suppl (TRUE METRIX METER) w/Device KIT Check blood sugar fasting and ant bedtime and record 1 kit 0  . glucose blood (TRUE METRIX BLOOD GLUCOSE TEST) test strip Use as instructed 100 each 12  . TRUEPLUS LANCETS 28G MISC Check blood sugar fasting and at bedtime 100 each 0  . allopurinol (ZYLOPRIM) 300 MG tablet Take 1 tablet (300 mg total) by mouth daily. 30 tablet 6  . amLODipine (NORVASC) 10 MG tablet Take 1 tablet (10 mg total) by mouth daily. 90 tablet 1  . atorvastatin (LIPITOR) 20 MG tablet Take 1 tablet (20 mg total) by mouth daily. 90  tablet 1  . colchicine 0.6 MG tablet Take 2 tablets (1.2 mg) orally at the onset of a gout attack; may repeat 1 tablet (0.6 mg) in 2 hours if symptoms persist 60 tablet 2  . lisinopril (PRINIVIL,ZESTRIL) 20 MG tablet Take 1 tablet (20 mg total) by mouth daily. 90 tablet 1  . metFORMIN (GLUCOPHAGE) 1000 MG tablet Take 1 tablet (1,000 mg total) by mouth 2 (two) times daily with a meal. 180 tablet 1  . predniSONE (DELTASONE) 20 MG tablet Take 1 tablet (20 mg total) by mouth 2 (two) times daily with a meal. 10 tablet 0  . traMADol (ULTRAM) 50 MG tablet Take 1 tablet (50 mg total) by mouth every 8 (eight) hours as needed. 30 tablet 0  . Albuterol Sulfate (PROAIR RESPICLICK) 989 (90 Base) MCG/ACT AEPB Inhale 2 puffs into the lungs every 6 (six) hours as needed (shortness of breath). (Patient not taking: Reported on 01/08/2017) 1 each 3  . HYDROcodone-acetaminophen (NORCO/VICODIN) 5-325 MG tablet Take 1 tablet by mouth every 4 (four) hours as needed. (Patient not taking: Reported on 09/25/2016) 4 tablet 0  . ibuprofen (ADVIL,MOTRIN) 800 MG tablet Take 1 tablet (800 mg total) by mouth 3 (three) times daily. (Patient not taking: Reported on 01/08/2017) 21 tablet 0  . methocarbamol (ROBAXIN) 500 MG tablet Take 1 tablet (500 mg total) by mouth 3 (three) times daily between meals  as needed. (Patient not taking: Reported on 01/08/2017) 20 tablet 0   No facility-administered medications prior to visit.     ROS Review of Systems  Constitutional: Negative for activity change and appetite change.  HENT: Negative for sinus pressure and sore throat.   Eyes: Negative for visual disturbance.  Respiratory: Negative for cough, chest tightness and shortness of breath.   Cardiovascular: Negative for chest pain and leg swelling.  Gastrointestinal: Negative for abdominal distention, abdominal pain, constipation and diarrhea.  Endocrine: Negative.   Genitourinary: Positive for frequency. Negative for dysuria.    Musculoskeletal: Negative for joint swelling and myalgias.  Skin: Negative for rash.  Allergic/Immunologic: Negative.   Neurological: Positive for numbness. Negative for weakness and light-headedness.  Psychiatric/Behavioral: Negative for dysphoric mood and suicidal ideas.    Objective:  BP (!) 180/120   Pulse 84   Temp 98.3 F (36.8 C) (Oral)   Ht 6' 1"  (1.854 m)   Wt 230 lb 3.2 oz (104.4 kg)   SpO2 99%   BMI 30.37 kg/m   BP/Weight 01/08/2017 1/85/6314 12/06/261  Systolic BP 785 885 027  Diastolic BP 741 80 87  Wt. (Lbs) 230.2 230.2 -  BMI 30.37 30.37 -      Physical Exam  Constitutional: He is oriented to person, place, and time. He appears well-developed and well-nourished.  Cardiovascular: Normal rate, normal heart sounds and intact distal pulses.   No murmur heard. Pulmonary/Chest: Effort normal and breath sounds normal. He has no wheezes. He has no rales. He exhibits no tenderness.  Abdominal: Soft. Bowel sounds are normal. He exhibits no distension and no mass. There is no tenderness.  Musculoskeletal: Normal range of motion.  Neurological: He is alert and oriented to person, place, and time.  Skin: Skin is warm and dry.  Psychiatric: He has a normal mood and affect.     CMP Latest Ref Rng & Units 07/11/2016 10/23/2015 03/29/2015  Glucose 65 - 99 mg/dL 237(H) 124(H) 171(H)  BUN 6 - 24 mg/dL 25(H) 15 19  Creatinine 0.76 - 1.27 mg/dL 1.67(H) 1.18 1.48(H)  Sodium 134 - 144 mmol/L 131(L) 138 140  Potassium 3.5 - 5.2 mmol/L 3.9 4.4 4.3  Chloride 96 - 106 mmol/L 90(L) 102 101  CO2 18 - 29 mmol/L 28 28 22   Calcium 8.7 - 10.2 mg/dL 9.5 9.1 8.9  Total Protein 6.0 - 8.5 g/dL 7.9 7.2 6.9  Total Bilirubin 0.0 - 1.2 mg/dL 1.2 0.8 0.3  Alkaline Phos 39 - 117 IU/L 117 96 139(H)  AST 0 - 40 IU/L 28 44(H) 50(H)  ALT 0 - 44 IU/L 28 64(H) 70(H)    Lipid Panel     Component Value Date/Time   CHOL 194 10/23/2015 1009   TRIG 229 (H) 10/23/2015 1009   HDL 37 (L) 10/23/2015  1009   CHOLHDL 5.2 (H) 10/23/2015 1009   VLDL 46 (H) 10/23/2015 1009   LDLCALC 111 10/23/2015 1009    Lab Results  Component Value Date   HGBA1C 5.7 01/08/2017    Assessment & Plan:   1. Type 2 diabetes mellitus without complication, without long-term current use of insulin (HCC) Controlled with A1c of 5.7 Continue diabetic diet, lifestyle modifications - POCT glucose (manual entry) - POCT glycosylated hemoglobin (Hb A1C) - metFORMIN (GLUCOPHAGE) 1000 MG tablet; Take 1 tablet (1,000 mg total) by mouth 2 (two) times daily with a meal. For diabetes mellitus  Dispense: 180 tablet; Refill: 1 - atorvastatin (LIPITOR) 20 MG tablet; Take 1 tablet (20  mg total) by mouth daily. For hypercholesterolemia  Dispense: 90 tablet; Refill: 1 - Ambulatory referral to Podiatry  2. Essential hypertension Uncontrolled due to running out of her antihypertensives Clonidine administered and blood pressure repeated after 30 minutes Medications refills Low-sodium, DASH diet - amLODipine (NORVASC) 10 MG tablet; Take 1 tablet (10 mg total) by mouth daily. For hypertension  Dispense: 90 tablet; Refill: 1 - lisinopril (PRINIVIL,ZESTRIL) 20 MG tablet; Take 1 tablet (20 mg total) by mouth daily. For hypertension  Dispense: 90 tablet; Refill: 1 - CMP14+EGFR - cloNIDine (CATAPRES) tablet 0.2 mg; Take 2 tablets (0.2 mg total) by mouth once.  3. Idiopathic chronic gout without tophus, unspecified site No acute flare - colchicine 0.6 MG tablet; Take 2 tablets (1.2 mg) orally at the onset of a gout attack; may repeat 1 tablet (0.6 mg) in 2 hours if symptoms persist  Dispense: 60 tablet; Refill: 2  4. Other diabetic neurological complication associated with type 2 diabetes mellitus ([place]) Commenced on gabapentin-side effects discussed  5. Screening for viral disease - HIV antibody (with reflex)  6. Need for influenza vaccination - Flu Vaccine QUAD 36+ mos IM   Meds ordered this encounter  Medications  .  metFORMIN (GLUCOPHAGE) 1000 MG tablet    Sig: Take 1 tablet (1,000 mg total) by mouth 2 (two) times daily with a meal. For diabetes mellitus    Dispense:  180 tablet    Refill:  1  . amLODipine (NORVASC) 10 MG tablet    Sig: Take 1 tablet (10 mg total) by mouth daily. For hypertension    Dispense:  90 tablet    Refill:  1  . lisinopril (PRINIVIL,ZESTRIL) 20 MG tablet    Sig: Take 1 tablet (20 mg total) by mouth daily. For hypertension    Dispense:  90 tablet    Refill:  1  . atorvastatin (LIPITOR) 20 MG tablet    Sig: Take 1 tablet (20 mg total) by mouth daily. For hypercholesterolemia    Dispense:  90 tablet    Refill:  1  . allopurinol (ZYLOPRIM) 300 MG tablet    Sig: Take 1 tablet (300 mg total) by mouth daily. For prevention of gout    Dispense:  30 tablet    Refill:  6  . gabapentin (NEURONTIN) 300 MG capsule    Sig: Take 1 capsule (300 mg total) by mouth 2 (two) times daily. For diabetic neuropathy    Dispense:  60 capsule    Refill:  5  . colchicine 0.6 MG tablet    Sig: Take 2 tablets (1.2 mg) orally at the onset of a gout attack; may repeat 1 tablet (0.6 mg) in 2 hours if symptoms persist    Dispense:  60 tablet    Refill:  2  . albuterol (PROVENTIL HFA;VENTOLIN HFA) 108 (90 Base) MCG/ACT inhaler    Sig: Inhale 2 puffs into the lungs every 6 (six) hours as needed for wheezing or shortness of breath.    Dispense:  1 Inhaler    Refill:  2  . cloNIDine (CATAPRES) tablet 0.2 mg    Follow-up: Return in about 3 months (around 04/10/2017) for Follow-up on diabetes and hypertension.   Arnoldo Morale MD

## 2017-01-09 ENCOUNTER — Telehealth: Payer: Self-pay

## 2017-01-09 LAB — CMP14+EGFR
ALK PHOS: 106 IU/L (ref 39–117)
ALT: 27 IU/L (ref 0–44)
AST: 21 IU/L (ref 0–40)
Albumin/Globulin Ratio: 1.5 (ref 1.2–2.2)
Albumin: 4.4 g/dL (ref 3.5–5.5)
BUN / CREAT RATIO: 13 (ref 9–20)
BUN: 19 mg/dL (ref 6–24)
Bilirubin Total: 0.6 mg/dL (ref 0.0–1.2)
CO2: 25 mmol/L (ref 20–29)
CREATININE: 1.52 mg/dL — AB (ref 0.76–1.27)
Calcium: 9 mg/dL (ref 8.7–10.2)
Chloride: 99 mmol/L (ref 96–106)
GFR, EST AFRICAN AMERICAN: 60 mL/min/{1.73_m2} (ref >59)
GFR, EST NON AFRICAN AMERICAN: 52 mL/min/{1.73_m2} — AB (ref >59)
GLOBULIN, TOTAL: 2.9 g/dL (ref 1.5–4.5)
Glucose: 102 mg/dL — ABNORMAL HIGH (ref 65–99)
Potassium: 4.3 mmol/L (ref 3.5–5.2)
SODIUM: 139 mmol/L (ref 134–144)
Total Protein: 7.3 g/dL (ref 6.0–8.5)

## 2017-01-09 LAB — HIV ANTIBODY (ROUTINE TESTING W REFLEX): HIV Screen 4th Generation wRfx: NONREACTIVE

## 2017-01-09 NOTE — Telephone Encounter (Signed)
Pt was called and informed of lab results. 

## 2017-01-20 MED FILL — ?METFORMIN HCL 1,000 MG TAB: 1000 | 30 days supply | Qty: 60 | Fill #0

## 2017-01-20 MED FILL — AMLODIPINE BESYLATE 10 MG T: 10 | 30 days supply | Qty: 30 | Fill #0

## 2017-01-20 MED FILL — !VENTOLIN HFA INHALER: 108 (90 BAS | 25 days supply | Qty: 18 | Fill #0

## 2017-01-20 MED FILL — COLCHICINE 0.6 MG TABS: 0.6 | 20 days supply | Qty: 60 | Fill #0

## 2017-01-20 MED FILL — GABAPENTIN 300 MG CAPSULE: 300 | 30 days supply | Qty: 60 | Fill #0

## 2017-01-20 MED FILL — ?ATORVASTATIN 20 MG TABLET: 20 | 30 days supply | Qty: 30 | Fill #0

## 2017-01-20 MED FILL — ?ALLOPURINOL 300 MG TABLET: 300 | 30 days supply | Qty: 30 | Fill #0

## 2017-01-21 MED FILL — TRUE METRIX TEST STRIP: 25 days supply | Qty: 100 | Fill #2

## 2017-02-28 ENCOUNTER — Encounter (HOSPITAL_COMMUNITY): Payer: Self-pay

## 2017-02-28 ENCOUNTER — Other Ambulatory Visit: Payer: Self-pay

## 2017-02-28 DIAGNOSIS — I129 Hypertensive chronic kidney disease with stage 1 through stage 4 chronic kidney disease, or unspecified chronic kidney disease: Secondary | ICD-10-CM | POA: Insufficient documentation

## 2017-02-28 DIAGNOSIS — E1122 Type 2 diabetes mellitus with diabetic chronic kidney disease: Secondary | ICD-10-CM | POA: Insufficient documentation

## 2017-02-28 DIAGNOSIS — N189 Chronic kidney disease, unspecified: Secondary | ICD-10-CM | POA: Insufficient documentation

## 2017-02-28 DIAGNOSIS — Z79899 Other long term (current) drug therapy: Secondary | ICD-10-CM | POA: Insufficient documentation

## 2017-02-28 DIAGNOSIS — J45909 Unspecified asthma, uncomplicated: Secondary | ICD-10-CM | POA: Insufficient documentation

## 2017-02-28 DIAGNOSIS — L0231 Cutaneous abscess of buttock: Secondary | ICD-10-CM | POA: Insufficient documentation

## 2017-02-28 DIAGNOSIS — Z7984 Long term (current) use of oral hypoglycemic drugs: Secondary | ICD-10-CM | POA: Insufficient documentation

## 2017-02-28 DIAGNOSIS — Z23 Encounter for immunization: Secondary | ICD-10-CM | POA: Insufficient documentation

## 2017-02-28 DIAGNOSIS — F17210 Nicotine dependence, cigarettes, uncomplicated: Secondary | ICD-10-CM | POA: Insufficient documentation

## 2017-02-28 MED ORDER — OXYCODONE-ACETAMINOPHEN 5-325 MG PO TABS
1.0000 | ORAL_TABLET | ORAL | Status: DC | PRN
  Administered 2017-02-28: 1 via ORAL
  Filled 2017-02-28: qty 1
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
## 2017-03-01 ENCOUNTER — Emergency Department (HOSPITAL_COMMUNITY)
Admission: EM | Admit: 2017-03-01 | Discharge: 2017-03-01 | Disposition: A | Discharge status: Home / Self Care | Payer: Self-pay | Attending: Emergency Medicine | Admitting: Emergency Medicine

## 2017-03-01 DIAGNOSIS — I10 Essential (primary) hypertension: Secondary | ICD-10-CM

## 2017-03-01 DIAGNOSIS — L0231 Cutaneous abscess of buttock: Secondary | ICD-10-CM

## 2017-03-01 DIAGNOSIS — Z789 Other specified health status: Secondary | ICD-10-CM

## 2017-03-01 DIAGNOSIS — L0291 Cutaneous abscess, unspecified: Secondary | ICD-10-CM

## 2017-03-01 LAB — CBG MONITORING, ED: GLUCOSE-CAPILLARY: 131 mg/dL — AB (ref 65–99)

## 2017-03-01 MED ORDER — DOXYCYCLINE HYCLATE 100 MG PO CAPS: 100.0000 mg | ORAL_CAPSULE | Freq: Two times a day (BID) | ORAL | 0 refills | Status: DC

## 2017-03-01 MED ORDER — AMLODIPINE BESYLATE 10 MG PO TABS: 10.0000 mg | ORAL_TABLET | Freq: Every day | ORAL | 1 refills | Status: DC

## 2017-03-01 MED ORDER — LISINOPRIL 20 MG PO TABS: 20.0000 mg | ORAL_TABLET | Freq: Every day | ORAL | 1 refills | Status: DC

## 2017-03-01 MED ORDER — LIDOCAINE-EPINEPHRINE (PF) 2 %-1:200000 IJ SOLN
10.0000 mL | Freq: Once | INTRAMUSCULAR | Status: AC
  Administered 2017-03-01: 10 mL
  Filled 2017-03-01: qty 20

## 2017-03-01 MED ORDER — TETANUS-DIPHTH-ACELL PERTUSSIS 5-2.5-18.5 LF-MCG/0.5 IM SUSP
0.5000 mL | Freq: Once | INTRAMUSCULAR | Status: AC
  Administered 2017-03-01: 0.5 mL via INTRAMUSCULAR
  Filled 2017-03-01: qty 0.5

## 2017-03-01 NOTE — ED Notes (Signed)
I&D tray at the bedside

## 2017-03-01 NOTE — ED Notes (Signed)
Pt given extra guaze and tape, sand which and milk. Ambulated without difficulty.

## 2017-03-01 NOTE — Discharge Instructions (Signed)
Please take medication as prescribed Return if you are worse at any time especially fever, nausea , vomiting. Follow up for packing removal on Monday or Tuesday.
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
## 2017-03-01 NOTE — ED Notes (Addendum)
Pt requested Ginger Ale and pt given the same OK per RN Santiago Glad

## 2017-03-03 ENCOUNTER — Other Ambulatory Visit: Payer: Self-pay

## 2017-03-03 ENCOUNTER — Ambulatory Visit: Payer: Self-pay | Attending: Family Medicine | Admitting: Family Medicine

## 2017-03-03 ENCOUNTER — Encounter: Payer: Self-pay | Admitting: Family Medicine

## 2017-03-03 VITALS — BP 174/113 | HR 89 | Temp 98.5°F | Resp 16 | Wt 236.0 lb

## 2017-03-03 DIAGNOSIS — Z4800 Encounter for change or removal of nonsurgical wound dressing: Secondary | ICD-10-CM | POA: Insufficient documentation

## 2017-03-03 DIAGNOSIS — I160 Hypertensive urgency: Secondary | ICD-10-CM | POA: Insufficient documentation

## 2017-03-03 DIAGNOSIS — Z7984 Long term (current) use of oral hypoglycemic drugs: Secondary | ICD-10-CM | POA: Insufficient documentation

## 2017-03-03 DIAGNOSIS — Z9114 Patient's other noncompliance with medication regimen: Secondary | ICD-10-CM | POA: Insufficient documentation

## 2017-03-03 DIAGNOSIS — Z79899 Other long term (current) drug therapy: Secondary | ICD-10-CM | POA: Insufficient documentation

## 2017-03-03 DIAGNOSIS — M109 Gout, unspecified: Secondary | ICD-10-CM | POA: Insufficient documentation

## 2017-03-03 DIAGNOSIS — Z91199 Patient's noncompliance with other medical treatment and regimen due to unspecified reason: Secondary | ICD-10-CM

## 2017-03-03 DIAGNOSIS — E119 Type 2 diabetes mellitus without complications: Secondary | ICD-10-CM | POA: Insufficient documentation

## 2017-03-03 DIAGNOSIS — J45909 Unspecified asthma, uncomplicated: Secondary | ICD-10-CM | POA: Insufficient documentation

## 2017-03-03 DIAGNOSIS — L0231 Cutaneous abscess of buttock: Secondary | ICD-10-CM | POA: Insufficient documentation

## 2017-03-03 DIAGNOSIS — I10 Essential (primary) hypertension: Secondary | ICD-10-CM | POA: Insufficient documentation

## 2017-03-03 DIAGNOSIS — Z9119 Patient's noncompliance with other medical treatment and regimen: Secondary | ICD-10-CM

## 2017-03-03 LAB — GLUCOSE, POCT (MANUAL RESULT ENTRY): POC GLUCOSE: 106 mg/dL — AB (ref 70–99)

## 2017-03-03 MED ORDER — CLONIDINE HCL 0.2 MG PO TABS
0.2000 mg | ORAL_TABLET | Freq: Once | ORAL | Status: AC
  Administered 2017-03-03: 0.2 mg via ORAL

## 2017-03-03 MED FILL — ?DOXYCYCLINE 100MG TABLET: 100 | 10 days supply | Qty: 20 | Fill #0

## 2017-03-03 NOTE — Progress Notes (Addendum)
Subjective:  Patient ID: Craig West, male    DOB: [birth year]  Age: 51 y.o. MRN: [id number]  CC: Wound Check   HPI Satish Hammers  is a 51 year old male with a history of hypertension, type 2 diabetes mellitus (A1c 5.7), asthma, gout who presents today for dressing change.  He had an incision and drainage of left gluteal abscess at the emergency room 2 days ago this was packed with iodoform and he was placed on doxycycline. He presents today for dressing change and denies fever or drainage from site; he just started taking his antibiotic today.  His blood pressure is severely elevated at 184/121 and he did not take his medication today because he was in a hurry.  Clonidine 0.2 mg administered in the clinic.  Past Medical History:  Diagnosis Date  . Asthma   . Diabetes mellitus without complication ([place])   . Gout   . Hypertension     No past surgical history on file.  No Known Allergies   Outpatient Medications Prior to Visit  Medication Sig Dispense Refill  . allopurinol (ZYLOPRIM) 300 MG tablet Take 1 tablet (300 mg total) by mouth daily. For prevention of gout 30 tablet 6  . amLODipine (NORVASC) 10 MG tablet Take 1 tablet (10 mg total) by mouth daily. For hypertension 90 tablet 1  . atorvastatin (LIPITOR) 20 MG tablet Take 1 tablet (20 mg total) by mouth daily. For hypercholesterolemia 90 tablet 1  . Blood Glucose Monitoring Suppl (TRUE METRIX METER) w/Device KIT Check blood sugar fasting and ant bedtime and record 1 kit 0  . colchicine 0.6 MG tablet Take 2 tablets (1.2 mg) orally at the onset of a gout attack; may repeat 1 tablet (0.6 mg) in 2 hours if symptoms persist 60 tablet 2  . doxycycline (VIBRAMYCIN) 100 MG capsule Take 1 capsule (100 mg total) by mouth 2 (two) times daily. 20 capsule 0  . gabapentin (NEURONTIN) 300 MG capsule Take 1 capsule (300 mg total) by mouth 2 (two) times daily. For diabetic neuropathy 60 capsule 5  . glucose blood (TRUE METRIX BLOOD GLUCOSE  TEST) test strip Use as instructed 100 each 12  . lisinopril (PRINIVIL,ZESTRIL) 20 MG tablet Take 1 tablet (20 mg total) by mouth daily. For hypertension 90 tablet 1  . metFORMIN (GLUCOPHAGE) 1000 MG tablet Take 1 tablet (1,000 mg total) by mouth 2 (two) times daily with a meal. For diabetes mellitus 180 tablet 1  . TRUEPLUS LANCETS 28G MISC Check blood sugar fasting and at bedtime 100 each 0  . albuterol (PROVENTIL HFA;VENTOLIN HFA) 108 (90 Base) MCG/ACT inhaler Inhale 2 puffs into the lungs every 6 (six) hours as needed for wheezing or shortness of breath. 1 Inhaler 2   No facility-administered medications prior to visit.     ROS Review of Systems  Constitutional: Negative for activity change and appetite change.  HENT: Negative for sinus pressure and sore throat.   Eyes: Negative for visual disturbance.  Respiratory: Negative for cough, chest tightness and shortness of breath.   Cardiovascular: Negative for chest pain and leg swelling.  Gastrointestinal: Negative for abdominal distention, abdominal pain, constipation and diarrhea.  Endocrine: Negative.   Genitourinary: Negative for dysuria.  Musculoskeletal: Negative for joint swelling and myalgias.  Skin:       See hpi  Allergic/Immunologic: Negative.   Neurological: Negative for weakness, light-headedness and numbness.  Psychiatric/Behavioral: Negative for dysphoric mood and suicidal ideas.    Objective:  BP (!) 174/113 (BP Location: Right  Arm, Cuff Size: Large)   Pulse 89   Temp 98.5 F (36.9 C) (Oral)   Resp 16   Wt 236 lb (107 kg)   SpO2 99%   BMI 31.14 kg/m   BP/Weight 03/03/2017 03/01/2017 32/41/9914  Systolic BP 445 848 -  Diastolic BP 350 757 -  Wt. (Lbs) 236 - 235  BMI 31.14 31 -      Physical Exam  Constitutional: He is oriented to person, place, and time. He appears well-developed and well-nourished.  Cardiovascular: Normal rate, normal heart sounds and intact distal pulses.  No murmur  heard. Pulmonary/Chest: Effort normal and breath sounds normal. He has no wheezes. He has no rales. He exhibits no tenderness.  Abdominal: Soft. Bowel sounds are normal. He exhibits no distension and no mass. There is no tenderness.  Musculoskeletal: Normal range of motion.  Neurological: He is alert and oriented to person, place, and time.  Skin:  Left buttock abscess with tunnel, surrounding tenderness, no discharge     Lab Results  Component Value Date   HGBA1C 5.7 01/08/2017    Assessment & Plan:   1. Type 2 diabetes mellitus without complication, without long-term current use of insulin (HCC) Controlled with A1c of 5.7 - Glucose (CBG)  2. Hypertensive urgency Due to non compliance Clonidine 0.38m administered and BP rechecked after 371mutes Low sodium, DASH diet - cloNIDine (CATAPRES) tablet 0.2 mg  3. Abscess of left buttock Healing Packing performed in the clinic with iodoform He has been provided with dressing change materials Complete course of doxycycline - he has a previously scheduled appointment with me in 1 month; we will call him to come in 1 week for a wound check. Advised to apply heat  4. Non-compliance Discussed implications and complications of noncompliance and the patient is nonchalant about it.   Meds ordered this encounter  Medications  . cloNIDine (CATAPRES) tablet 0.2 mg    Follow-up: Return for follow up of abscess and chronic medical conditions keep previously scheduled appointment.. Arnoldo MoraleD

## 2017-03-03 NOTE — Patient Instructions (Signed)

## 2017-03-04 ENCOUNTER — Ambulatory Visit: Payer: Self-pay | Admitting: Family Medicine

## 2017-03-12 ENCOUNTER — Ambulatory Visit: Payer: Self-pay | Admitting: Family Medicine

## 2017-03-13 ENCOUNTER — Encounter: Payer: Self-pay | Admitting: Family Medicine

## 2017-03-13 ENCOUNTER — Ambulatory Visit: Payer: Self-pay | Attending: Family Medicine | Admitting: Family Medicine

## 2017-03-13 VITALS — BP 169/105 | HR 79 | Temp 97.8°F | Ht 73.0 in | Wt 232.0 lb

## 2017-03-13 DIAGNOSIS — E119 Type 2 diabetes mellitus without complications: Secondary | ICD-10-CM | POA: Insufficient documentation

## 2017-03-13 DIAGNOSIS — Z7984 Long term (current) use of oral hypoglycemic drugs: Secondary | ICD-10-CM | POA: Insufficient documentation

## 2017-03-13 DIAGNOSIS — L0231 Cutaneous abscess of buttock: Secondary | ICD-10-CM | POA: Insufficient documentation

## 2017-03-13 DIAGNOSIS — I10 Essential (primary) hypertension: Secondary | ICD-10-CM | POA: Insufficient documentation

## 2017-03-13 DIAGNOSIS — M109 Gout, unspecified: Secondary | ICD-10-CM | POA: Insufficient documentation

## 2017-03-13 DIAGNOSIS — Z79899 Other long term (current) drug therapy: Secondary | ICD-10-CM | POA: Insufficient documentation

## 2017-03-13 DIAGNOSIS — J45909 Unspecified asthma, uncomplicated: Secondary | ICD-10-CM | POA: Insufficient documentation

## 2017-03-13 LAB — GLUCOSE, POCT (MANUAL RESULT ENTRY): POC GLUCOSE: 100 mg/dL — AB (ref 70–99)

## 2017-03-13 MED ORDER — LISINOPRIL-HYDROCHLOROTHIAZIDE 20-12.5 MG PO TABS: 2.0000 | ORAL_TABLET | Freq: Every day | ORAL | 5 refills | Status: DC

## 2017-03-13 NOTE — Patient Instructions (Signed)

## 2017-03-13 NOTE — Progress Notes (Signed)
Subjective:  Patient ID: Craig West, male    DOB: 21-Dec-1965  Age: 51 y.o. MRN: [id number]  CC: Wound Check   HPI Craig West  is a 51 year old male with a history of hypertension, type 2 diabetes mellitus (A1c 5.7), asthma, gout who presents today for follow-up of gluteal abscess  At his last visit the abscess was packed with iodoform gauze  however the gauze fell out and the patient has been taking normal showers.  He denies drainage from the site and has no fevers and pain is minimal. He is currently taking doxycycline.  His blood pressure is elevated despite compliance with his antihypertensive.  Past Medical History:  Diagnosis Date  . Asthma   . Diabetes mellitus without complication ([place])   . Gout   . Hypertension     History reviewed. No pertinent surgical history.  No Known Allergies   Outpatient Medications Prior to Visit  Medication Sig Dispense Refill  . albuterol (PROVENTIL HFA;VENTOLIN HFA) 108 (90 Base) MCG/ACT inhaler Inhale 2 puffs into the lungs every 6 (six) hours as needed for wheezing or shortness of breath. 1 Inhaler 2  . allopurinol (ZYLOPRIM) 300 MG tablet Take 1 tablet (300 mg total) by mouth daily. For prevention of gout 30 tablet 6  . amLODipine (NORVASC) 10 MG tablet Take 1 tablet (10 mg total) by mouth daily. For hypertension 90 tablet 1  . atorvastatin (LIPITOR) 20 MG tablet Take 1 tablet (20 mg total) by mouth daily. For hypercholesterolemia 90 tablet 1  . Blood Glucose Monitoring Suppl (TRUE METRIX METER) w/Device KIT Check blood sugar fasting and ant bedtime and record 1 kit 0  . colchicine 0.6 MG tablet Take 2 tablets (1.2 mg) orally at the onset of a gout attack; may repeat 1 tablet (0.6 mg) in 2 hours if symptoms persist 60 tablet 2  . doxycycline (VIBRAMYCIN) 100 MG capsule Take 1 capsule (100 mg total) by mouth 2 (two) times daily. 20 capsule 0  . gabapentin (NEURONTIN) 300 MG capsule Take 1 capsule (300 mg total) by mouth 2 (two) times  daily. For diabetic neuropathy 60 capsule 5  . glucose blood (TRUE METRIX BLOOD GLUCOSE TEST) test strip Use as instructed 100 each 12  . metFORMIN (GLUCOPHAGE) 1000 MG tablet Take 1 tablet (1,000 mg total) by mouth 2 (two) times daily with a meal. For diabetes mellitus 180 tablet 1  . TRUEPLUS LANCETS 28G MISC Check blood sugar fasting and at bedtime 100 each 0  . lisinopril (PRINIVIL,ZESTRIL) 20 MG tablet Take 1 tablet (20 mg total) by mouth daily. For hypertension 90 tablet 1   No facility-administered medications prior to visit.     ROS Review of Systems  Constitutional: Negative for activity change and appetite change.  HENT: Negative for sinus pressure and sore throat.   Eyes: Negative for visual disturbance.  Respiratory: Negative for cough, chest tightness and shortness of breath.   Cardiovascular: Negative for chest pain and leg swelling.  Gastrointestinal: Negative for abdominal distention, abdominal pain, constipation and diarrhea.  Endocrine: Negative.   Genitourinary: Negative for dysuria.  Musculoskeletal: Negative for joint swelling and myalgias.  Skin: Positive for wound. Negative for rash.  Allergic/Immunologic: Negative.   Neurological: Negative for weakness, light-headedness and numbness.  Psychiatric/Behavioral: Negative for dysphoric mood and suicidal ideas.    Objective:  BP (!) 169/105   Pulse 79   Temp 97.8 F (36.6 C) (Oral)   Ht _0  (1.854 m)   Wt 232 lb (105.2 kg)  SpO2 98%   BMI 30.61 kg/m   BP/Weight 03/13/2017 03/03/2017 92/0/0415  Systolic BP 930 123 799  Diastolic BP 094 000 505  Wt. (Lbs) 232 236 -  BMI 30.61 31.14 31      Physical Exam  Constitutional: He is oriented to person, place, and time. He appears well-developed and well-nourished.  Cardiovascular: Normal rate, normal heart sounds and intact distal pulses.  No murmur heard. Pulmonary/Chest: Effort normal and breath sounds normal. He has no wheezes. He has no rales. He  exhibits no tenderness.  Abdominal: Soft. Bowel sounds are normal. He exhibits no distension and no mass. There is no tenderness.  Musculoskeletal: Normal range of motion.  Neurological: He is alert and oriented to person, place, and time.  Skin:  Left gluteal abscess with no drainage, healing well.    Lab Results  Component Value Date   HGBA1C 5.7 01/08/2017    Assessment & Plan:   1. Type 2 diabetes mellitus without complication, without long-term current use of insulin (HCC) Controlled with A1c of 5.7 - POCT glucose (manual entry)  2. Gluteal abscess Improving Continue application of heat Contine to doxycycline  3. Essential hypertension Controlled Low-sodium diet Switched from lisinopril to lisinopril/hydrochlorothiazide - lisinopril-hydrochlorothiazide (ZESTORETIC) 20-12.5 MG tablet; Take 2 tablets by mouth daily.  Dispense: 60 tablet; Refill: 5   Meds ordered this encounter  Medications  . lisinopril-hydrochlorothiazide (ZESTORETIC) 20-12.5 MG tablet    Sig: Take 2 tablets by mouth daily.    Dispense:  60 tablet    Refill:  5    Discontinue lisinopril    Follow-up: Return for Follow-up of chronic medical conditions, keep previously scheduled appointment.   Arnoldo Morale MD

## 2017-03-19 ENCOUNTER — Ambulatory Visit: Payer: Self-pay | Admitting: Family Medicine

## 2017-04-10 ENCOUNTER — Ambulatory Visit: Payer: Self-pay | Admitting: Family Medicine

## 2017-04-18 ENCOUNTER — Ambulatory Visit: Payer: Self-pay | Attending: Family Medicine | Admitting: Family Medicine

## 2017-04-18 ENCOUNTER — Encounter: Payer: Self-pay | Admitting: Family Medicine

## 2017-04-18 VITALS — BP 180/120 | HR 91 | Temp 97.9°F | Ht 73.0 in | Wt 233.0 lb

## 2017-04-18 DIAGNOSIS — E1122 Type 2 diabetes mellitus with diabetic chronic kidney disease: Secondary | ICD-10-CM | POA: Insufficient documentation

## 2017-04-18 DIAGNOSIS — I10 Essential (primary) hypertension: Secondary | ICD-10-CM

## 2017-04-18 DIAGNOSIS — I129 Hypertensive chronic kidney disease with stage 1 through stage 4 chronic kidney disease, or unspecified chronic kidney disease: Secondary | ICD-10-CM | POA: Insufficient documentation

## 2017-04-18 DIAGNOSIS — E119 Type 2 diabetes mellitus without complications: Secondary | ICD-10-CM

## 2017-04-18 DIAGNOSIS — J45909 Unspecified asthma, uncomplicated: Secondary | ICD-10-CM | POA: Insufficient documentation

## 2017-04-18 DIAGNOSIS — L0231 Cutaneous abscess of buttock: Secondary | ICD-10-CM | POA: Insufficient documentation

## 2017-04-18 DIAGNOSIS — Z9114 Patient's other noncompliance with medication regimen: Secondary | ICD-10-CM | POA: Insufficient documentation

## 2017-04-18 DIAGNOSIS — Z79899 Other long term (current) drug therapy: Secondary | ICD-10-CM | POA: Insufficient documentation

## 2017-04-18 DIAGNOSIS — M109 Gout, unspecified: Secondary | ICD-10-CM | POA: Insufficient documentation

## 2017-04-18 DIAGNOSIS — N182 Chronic kidney disease, stage 2 (mild): Secondary | ICD-10-CM | POA: Insufficient documentation

## 2017-04-18 DIAGNOSIS — E7800 Pure hypercholesterolemia, unspecified: Secondary | ICD-10-CM | POA: Insufficient documentation

## 2017-04-18 DIAGNOSIS — Z7984 Long term (current) use of oral hypoglycemic drugs: Secondary | ICD-10-CM | POA: Insufficient documentation

## 2017-04-18 LAB — GLUCOSE, POCT (MANUAL RESULT ENTRY): POC Glucose: 149 mg/dl — AB (ref 70–99)

## 2017-04-18 LAB — POCT GLYCOSYLATED HEMOGLOBIN (HGB A1C): Hemoglobin A1C: 6.1

## 2017-04-18 MED FILL — AMLODIPINE BESYLATE 10 MG T: 10 | 30 days supply | Qty: 30 | Fill #1

## 2017-04-18 MED FILL — LISINOPRIL-HCTZ 20-12.5 MG: 20-12.5 | 30 days supply | Qty: 60 | Fill #0

## 2017-04-18 NOTE — Progress Notes (Signed)
Subjective:  Patient ID: Craig West, male    DOB: 04-14-1965  Age: 52 y.o. MRN: [id number]  CC: Diabetes and Hypertension   HPI Craig West is a 52 year old male with a history of hypertension, type 2 diabetes mellitus (A1c 6.1), asthma, gout who presents today for follow-up visit. At his last office visit he was seen for a gluteal abscess and has completed a course of antibiotics and he reports the gluteal abscess has resolved.  His diabetes mellitus is controlled and he denies hypoglycemia, numbness in extremities or visual concerns.  He is not up-to-date on annual eye exam and has not been able to undergo this due to lack of medical coverage. His blood pressure is severely elevated and he endorses running out of his medications.  He denies chest pains, headaches, blurry vision or shortness of breath. Asthma symptoms have been stable and he denies any recent gout flares.  Past Medical History:  Diagnosis Date  . Asthma   . Diabetes mellitus without complication ([place])   . Gout   . Hypertension     No past surgical history on file.  No Known Allergies   Outpatient Medications Prior to Visit  Medication Sig Dispense Refill  . albuterol (PROVENTIL HFA;VENTOLIN HFA) 108 (90 Base) MCG/ACT inhaler Inhale 2 puffs into the lungs every 6 (six) hours as needed for wheezing or shortness of breath. 1 Inhaler 2  . allopurinol (ZYLOPRIM) 300 MG tablet Take 1 tablet (300 mg total) by mouth daily. For prevention of gout 30 tablet 6  . amLODipine (NORVASC) 10 MG tablet Take 1 tablet (10 mg total) by mouth daily. For hypertension 90 tablet 1  . colchicine 0.6 MG tablet Take 2 tablets (1.2 mg) orally at the onset of a gout attack; may repeat 1 tablet (0.6 mg) in 2 hours if symptoms persist 60 tablet 2  . gabapentin (NEURONTIN) 300 MG capsule Take 1 capsule (300 mg total) by mouth 2 (two) times daily. For diabetic neuropathy 60 capsule 5  . lisinopril-hydrochlorothiazide (ZESTORETIC) 20-12.5 MG  tablet Take 2 tablets by mouth daily. 60 tablet 5  . atorvastatin (LIPITOR) 20 MG tablet Take 1 tablet (20 mg total) by mouth daily. For hypercholesterolemia (Patient not taking: Reported on 04/18/2017) 90 tablet 1  . Blood Glucose Monitoring Suppl (TRUE METRIX METER) w/Device KIT Check blood sugar fasting and ant bedtime and record (Patient not taking: Reported on 04/18/2017) 1 kit 0  . doxycycline (VIBRAMYCIN) 100 MG capsule Take 1 capsule (100 mg total) by mouth 2 (two) times daily. (Patient not taking: Reported on 04/18/2017) 20 capsule 0  . glucose blood (TRUE METRIX BLOOD GLUCOSE TEST) test strip Use as instructed (Patient not taking: Reported on 04/18/2017) 100 each 12  . metFORMIN (GLUCOPHAGE) 1000 MG tablet Take 1 tablet (1,000 mg total) by mouth 2 (two) times daily with a meal. For diabetes mellitus (Patient not taking: Reported on 04/18/2017) 180 tablet 1  . TRUEPLUS LANCETS 28G MISC Check blood sugar fasting and at bedtime (Patient not taking: Reported on 04/18/2017) 100 each 0   No facility-administered medications prior to visit.     ROS Review of Systems  Constitutional: Negative for activity change and appetite change.  HENT: Negative for sinus pressure and sore throat.   Eyes: Negative for visual disturbance.  Respiratory: Negative for cough, chest tightness and shortness of breath.   Cardiovascular: Negative for chest pain and leg swelling.  Gastrointestinal: Negative for abdominal distention, abdominal pain, constipation and diarrhea.  Endocrine: Negative.  Genitourinary: Negative for dysuria.  Musculoskeletal: Negative for joint swelling and myalgias.  Skin: Negative for rash.  Allergic/Immunologic: Negative.   Neurological: Negative for weakness, light-headedness and numbness.  Psychiatric/Behavioral: Negative for dysphoric mood and suicidal ideas.    Objective:  BP (!) 180/120   Pulse 91   Temp 97.9 F (36.6 C) (Oral)   Ht _0  (1.854 m)   Wt 233 lb (105.7 kg)    SpO2 98%   BMI 30.74 kg/m   BP/Weight 04/18/2017 03/13/2017 26/10/3417  Systolic BP 622 297 989  Diastolic BP 211 941 740  Wt. (Lbs) 233 232 236  BMI 30.74 30.61 31.14      Physical Exam  Constitutional: He is oriented to person, place, and time. He appears well-developed and well-nourished.  Cardiovascular: Normal rate, normal heart sounds and intact distal pulses.  No murmur heard. Pulmonary/Chest: Effort normal and breath sounds normal. He has no wheezes. He has no rales. He exhibits no tenderness.  Abdominal: Soft. Bowel sounds are normal. He exhibits no distension and no mass. There is no tenderness.  Musculoskeletal: Normal range of motion.  Neurological: He is alert and oriented to person, place, and time.  Skin: Skin is warm and dry.  Psychiatric: He has a normal mood and affect.     CMP Latest Ref Rng & Units 01/08/2017 07/11/2016 10/23/2015  Glucose 65 - 99 mg/dL 102(H) 237(H) 124(H)  BUN 6 - 24 mg/dL 19 25(H) 15  Creatinine 0.76 - 1.27 mg/dL 1.52(H) 1.67(H) 1.18  Sodium 134 - 144 mmol/L 139 131(L) 138  Potassium 3.5 - 5.2 mmol/L 4.3 3.9 4.4  Chloride 96 - 106 mmol/L 99 90(L) 102  CO2 20 - 29 mmol/L _1 Calcium 8.7 - 10.2 mg/dL 9.0 9.5 9.1  Total Protein 6.0 - 8.5 g/dL 7.3 7.9 7.2  Total Bilirubin 0.0 - 1.2 mg/dL 0.6 1.2 0.8  Alkaline Phos 39 - 117 IU/L 106 117 96  AST 0 - 40 IU/L 21 28 44(H)  ALT 0 - 44 IU/L 27 28 64(H)    Lipid Panel     Component Value Date/Time   CHOL 194 10/23/2015 1009   TRIG 229 (H) 10/23/2015 1009   HDL 37 (L) 10/23/2015 1009   CHOLHDL 5.2 (H) 10/23/2015 1009   VLDL 46 (H) 10/23/2015 1009   LDLCALC 111 10/23/2015 1009    Lab Results  Component Value Date   HGBA1C 6.1 04/18/2017       Assessment & Plan:   1. Type 2 diabetes mellitus without complication, without long-term current use of insulin (HCC) Controlled with A1c of 6.1 Continue metformin Counseled on Diabetic diet, my plate method, 814 minutes of moderate  intensity exercise/week Keep blood sugar logs with fasting goals of 80-120 mg/dl, random of less than 180 and in the event of sugars less than 60 mg/dl or greater than 400 mg/dl please notify the clinic ASAP. It is recommended that you undergo annual eye exams and annual foot exams. Pneumovax is recommended every 5 years before the age of 22 and once for a lifetime at or after the age of 92. - POCT glucose (manual entry) - POCT glycosylated hemoglobin (Hb A1C)  2. Essential hypertension Uncontrolled due to noncompliance We have spoken with the pharmacy who will ensure he gets his medications today Counseled on blood pressure goal of less than 130/80, low-sodium, DASH diet, medication compliance, 150 minutes of moderate intensity exercise per week. Discussed medication compliance, adverse effects.   3. 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 Pure hypercholesterolemia Remains on statin Low-cholesterol diet  5. Non compliance w medication regimen Advised on implications of noncompliance   No orders of the defined types were placed in this encounter.   Follow-up: Return in about 3 months (around 07/17/2017) for Follow-up of chronic medical conditions.   Arnoldo Morale MD

## 2017-04-18 NOTE — Patient Instructions (Signed)

## 2017-05-15 ENCOUNTER — Telehealth: Payer: Self-pay | Admitting: Family Medicine

## 2017-05-15 ENCOUNTER — Encounter: Payer: Self-pay | Admitting: Family Medicine

## 2017-05-15 DIAGNOSIS — I10 Essential (primary) hypertension: Secondary | ICD-10-CM

## 2017-05-15 DIAGNOSIS — M1A00X0 Idiopathic chronic gout, unspecified site, without tophus (tophi): Secondary | ICD-10-CM

## 2017-05-15 DIAGNOSIS — E119 Type 2 diabetes mellitus without complications: Secondary | ICD-10-CM

## 2017-05-15 MED ORDER — GLUCOSE BLOOD VI STRP: ORAL_STRIP | 12 refills | Status: DC

## 2017-05-15 MED ORDER — TRUE METRIX METER W/DEVICE KIT: PACK | 0 refills | Status: AC

## 2017-05-15 MED ORDER — COLCHICINE 0.6 MG PO TABS: ORAL_TABLET | ORAL | 2 refills | Status: DC

## 2017-05-15 MED ORDER — ALLOPURINOL 300 MG PO TABS: 300.0000 mg | ORAL_TABLET | Freq: Every day | ORAL | 2 refills | Status: DC

## 2017-05-15 MED ORDER — AMLODIPINE BESYLATE 10 MG PO TABS: 10.0000 mg | ORAL_TABLET | Freq: Every day | ORAL | 0 refills | Status: DC

## 2017-05-15 MED ORDER — LISINOPRIL-HYDROCHLOROTHIAZIDE 20-12.5 MG PO TABS: 2.0000 | ORAL_TABLET | Freq: Every day | ORAL | 2 refills | Status: DC

## 2017-05-15 MED ORDER — TRUEPLUS LANCETS 28G MISC
2 refills | Status: DC
Start: 2017-05-15 — End: 2019-12-27

## 2017-05-15 MED ORDER — GABAPENTIN 300 MG PO CAPS: 300.0000 mg | ORAL_CAPSULE | Freq: Two times a day (BID) | ORAL | 2 refills | Status: DC

## 2017-05-15 MED ORDER — ALBUTEROL SULFATE HFA 108 (90 BASE) MCG/ACT IN AERS: 2.0000 | INHALATION_SPRAY | Freq: Four times a day (QID) | RESPIRATORY_TRACT | 2 refills | Status: DC | PRN

## 2017-05-15 MED ORDER — ATORVASTATIN CALCIUM 20 MG PO TABS: 20.0000 mg | ORAL_TABLET | Freq: Every day | ORAL | 0 refills | Status: DC

## 2017-05-15 MED ORDER — METFORMIN HCL 1000 MG PO TABS: 1000.0000 mg | ORAL_TABLET | Freq: Two times a day (BID) | ORAL | 0 refills | Status: DC

## 2017-05-15 MED FILL — LISINOPRIL-HCTZ 20-12.5 MG: 20-12.5 | 30 days supply | Qty: 60 | Fill #0

## 2017-05-15 MED FILL — GABAPENTIN 300 MG CAPSULE: 300 | 30 days supply | Qty: 60 | Fill #0

## 2017-05-15 MED FILL — TRUEplus LANCETS 28G MISC: 30 days supply | Qty: 100 | Fill #0

## 2017-05-15 MED FILL — ?ALLOPURINOL 300 MG TABL: 300 | 30 days supply | Qty: 30 | Fill #0

## 2017-05-15 MED FILL — TRUE METRIX TEST STRIP: 30 days supply | Qty: 100 | Fill #0

## 2017-05-15 MED FILL — ?METFORMIN HCL 1,000 MG TAB: 1000 | 30 days supply | Qty: 60 | Fill #0

## 2017-05-15 MED FILL — AMLODIPINE BESYLATE 10 MG T: 10 | 30 days supply | Qty: 30 | Fill #0

## 2017-05-15 MED FILL — ?ATORVASTATIN 20MG TABL: 20 | 30 days supply | Qty: 30 | Fill #0

## 2017-05-15 MED FILL — !VENTOLIN HFA INHALER: 108 (90 BAS | 25 days supply | Qty: 18 | Fill #0

## 2017-05-15 MED FILL — !COLCRYS 0.6 MG TABLET: 0.6 MG | 30 days supply | Qty: 60 | Fill #0

## 2017-05-15 NOTE — Telephone Encounter (Signed)
Patient called and requested to get refills on listed medications, patient stated he is completely out of medication.  amLODipine (NORVASC) 10 MG tablet [[id number]] atorvastatin (LIPITOR) 20 MG tablet [[id number]] albuterol (PROVENTIL HFA;VENTOLIN HFA) 108 (90 Base) MCG/ACT inhaler [[id number]]  metFORMIN (GLUCOPHAGE) 1000 MG tablet [[id number]]  TRUEPLUS LANCETS 28G MISC [[id number]]  lisinopril-hydrochlorothiazide (ZESTORETIC) 20-12.5 MG tablet [[id number]]  glucose blood (TRUE METRIX BLOOD GLUCOSE TEST) test strip [[id number]]  gabapentin (NEURONTIN) 300 MG capsule [[id number]] doxycycline (VIBRAMYCIN) 100 MG capsule [[id number]]  colchicine 0.6 MG tablet [[id number]]  allopurinol (ZYLOPRIM) 300 MG tablet [[id number]]  Please fu asap

## 2017-05-15 NOTE — Telephone Encounter (Signed)
Refilled

## 2017-05-25 ENCOUNTER — Emergency Department (HOSPITAL_COMMUNITY)
Admission: EM | Admit: 2017-05-25 | Discharge: 2017-05-25 | Disposition: A | Discharge status: Home / Self Care | Payer: Self-pay | Attending: Emergency Medicine | Admitting: Emergency Medicine

## 2017-05-25 ENCOUNTER — Other Ambulatory Visit: Payer: Self-pay

## 2017-05-25 ENCOUNTER — Encounter (HOSPITAL_COMMUNITY): Payer: Self-pay | Admitting: *Deleted

## 2017-05-25 DIAGNOSIS — L0291 Cutaneous abscess, unspecified: Secondary | ICD-10-CM

## 2017-05-25 DIAGNOSIS — F17210 Nicotine dependence, cigarettes, uncomplicated: Secondary | ICD-10-CM | POA: Insufficient documentation

## 2017-05-25 DIAGNOSIS — E1122 Type 2 diabetes mellitus with diabetic chronic kidney disease: Secondary | ICD-10-CM | POA: Insufficient documentation

## 2017-05-25 DIAGNOSIS — Z7984 Long term (current) use of oral hypoglycemic drugs: Secondary | ICD-10-CM | POA: Insufficient documentation

## 2017-05-25 DIAGNOSIS — L0231 Cutaneous abscess of buttock: Secondary | ICD-10-CM | POA: Insufficient documentation

## 2017-05-25 DIAGNOSIS — E1140 Type 2 diabetes mellitus with diabetic neuropathy, unspecified: Secondary | ICD-10-CM | POA: Insufficient documentation

## 2017-05-25 DIAGNOSIS — I129 Hypertensive chronic kidney disease with stage 1 through stage 4 chronic kidney disease, or unspecified chronic kidney disease: Secondary | ICD-10-CM | POA: Insufficient documentation

## 2017-05-25 DIAGNOSIS — J45909 Unspecified asthma, uncomplicated: Secondary | ICD-10-CM | POA: Insufficient documentation

## 2017-05-25 DIAGNOSIS — N189 Chronic kidney disease, unspecified: Secondary | ICD-10-CM | POA: Insufficient documentation

## 2017-05-25 DIAGNOSIS — Z79899 Other long term (current) drug therapy: Secondary | ICD-10-CM | POA: Insufficient documentation

## 2017-05-25 MED ORDER — HYDROCODONE-ACETAMINOPHEN 5-325 MG PO TABS: 1.0000 | ORAL_TABLET | ORAL | 0 refills | Status: DC | PRN

## 2017-05-25 MED ORDER — HYDROCHLOROTHIAZIDE 12.5 MG PO CAPS: 12.5000 mg | ORAL_CAPSULE | Freq: Every day | ORAL | Status: DC

## 2017-05-25 MED ORDER — LIDOCAINE-EPINEPHRINE (PF) 2 %-1:200000 IJ SOLN
20.0000 mL | Freq: Once | INTRAMUSCULAR | Status: DC
  Filled 2017-05-25: qty 20

## 2017-05-25 MED ORDER — LISINOPRIL 20 MG PO TABS: 20.0000 mg | ORAL_TABLET | Freq: Once | ORAL | Status: DC

## 2017-05-25 MED ORDER — AMLODIPINE BESYLATE 5 MG PO TABS: 10.0000 mg | ORAL_TABLET | Freq: Once | ORAL | Status: DC

## 2017-05-25 MED ORDER — DOXYCYCLINE HYCLATE 100 MG PO CAPS: 100.0000 mg | ORAL_CAPSULE | Freq: Two times a day (BID) | ORAL | 0 refills | Status: AC

## 2017-05-25 NOTE — ED Notes (Signed)
History of hypertension, patient states he has not taken his antihypertensive medications today.

## 2017-05-25 NOTE — Discharge Instructions (Addendum)
Please monitor your blood pressure at home.  Please make sure you are taking all your medications as directed.    You may have diarrhea from the antibiotics.  It is very important that you continue to take the antibiotics even if you get diarrhea unless a medical professional tells you that you may stop taking them.  If you stop too early the bacteria you are being treated for will become stronger and you may need different, more powerful antibiotics that have more side effects and worsening diarrhea.  Please stay well hydrated and consider probiotics as they may decrease the severity of your diarrhea.  Please be aware that if you take any hormonal contraception (birth control pills, nexplanon, the ring, etc) that your birth control will not work while you are taking antibiotics and you need to use back up protection as directed on the birth control medication information insert.   You are being prescribed a medication which may make you sleepy. For 24 hours after one dose please do not drive, operate heavy machinery, care for a small child with out another adult present, or perform any activities that may cause harm to you or someone else if you were to fall asleep or be impaired.

## 2017-05-25 NOTE — ED Provider Notes (Signed)
[facility] EMER[facility]ENCY DEPART[facility] Provider Note   CSN: [id number] Arrival date & time: 05/25/17  6503     History   Chief Complaint Chief Complaint  Patient presents with  . Abscess    HPI Craig West is a 52 y.o. male with a history of hypertension, diabetes, and gout who presents today for evaluation of a swelling on his lower back/upper buttocks on the left side. Marland Kitchen  He reports that it has been present for about 2 days.  Denies nausea, vomiting, no fevers or chills.  He reports that he feels well other than his abscess.  He has not tried anything for his pain or symptoms prior to arrival.  He reports he did not take his blood pressure medications yet this morning. Denies CP, SOB, or HA.  No symptoms other than the pain from abscess.   HPI  Past Medical History:  Diagnosis Date  . Asthma   . Diabetes mellitus without complication ([place])   . Gout   . Hypertension     Patient Active Problem List   Diagnosis Date Noted  . Non compliance w medication regimen 04/18/2017  . Gout 08/02/2016  . Diabetic neuropathy ([place]) 08/02/2016  . Chronic kidney disease 08/02/2016  . Hyperlipidemia 10/24/2015  . Asthma 10/16/2015  . Type 2 diabetes mellitus without complication ([place]) 54/65/6812  . Essential hypertension 06/13/2014  . Smoker 06/13/2014    History reviewed. No pertinent surgical history.     Home Medications    Prior to Admission medications   Medication Sig Start Date End Date Taking? Authorizing Provider  albuterol (PROVENTIL HFA;VENTOLIN HFA) 108 (90 Base) MCG/ACT inhaler Inhale 2 puffs into the lungs every 6 (six) hours as needed for wheezing or shortness of breath. 05/15/17   Charlott Rakes, MD  allopurinol (ZYLOPRIM) 300 MG tablet Take 1 tablet (300 mg total) by mouth daily. For prevention of gout 05/15/17   Charlott Rakes, MD  amLODipine (NORVASC) 10 MG tablet Take 1 tablet (10 mg total) by mouth daily. For hypertension 05/15/17   Charlott Rakes, MD  atorvastatin (LIPITOR) 20 MG tablet Take 1 tablet (20 mg total) by mouth daily. For hypercholesterolemia 05/15/17   Charlott Rakes, MD  Blood Glucose Monitoring Suppl (TRUE METRIX METER) w/Device KIT Check blood sugar fasting and ant bedtime and record 05/15/17   Charlott Rakes, MD  colchicine 0.6 MG tablet Take 2 tablets (1.2 mg) orally at the onset of a gout attack; may repeat 1 tablet (0.6 mg) in 2 hours if symptoms persist 05/15/17   Charlott Rakes, MD  doxycycline (VIBRAMYCIN) 100 MG capsule Take 1 capsule (100 mg total) by mouth 2 (two) times daily for 7 days. 05/25/17 06/01/17  Lorin Glass, PA-C  gabapentin (NEURONTIN) 300 MG capsule Take 1 capsule (300 mg total) by mouth 2 (two) times daily. For diabetic neuropathy 05/15/17   Charlott Rakes, MD  glucose blood (TRUE METRIX BLOOD GLUCOSE TEST) test strip Use as instructed 05/15/17   Charlott Rakes, MD  HYDROcodone-acetaminophen (NORCO/VICODIN) 5-325 MG tablet Take 1 tablet by mouth every 4 (four) hours as needed. 05/25/17   Lorin Glass, PA-C  lisinopril-hydrochlorothiazide (ZESTORETIC) 20-12.5 MG tablet Take 2 tablets by mouth daily. 05/15/17   Charlott Rakes, MD  metFORMIN (GLUCOPHAGE) 1000 MG tablet Take 1 tablet (1,000 mg total) by mouth 2 (two) times daily with a meal. For diabetes mellitus 05/15/17   Charlott Rakes, MD  TRUEPLUS LANCETS 28G MISC Check blood sugar fasting and at bedtime 05/15/17  Charlott Rakes, MD    Family History No family history on file.  Social History Social History   Tobacco Use  . Smoking status: Current Every Day Smoker    Packs/day: 0.50    Types: Cigarettes  . Smokeless tobacco: Never Used  . Tobacco comment: Pt states he started smoking again 7 years ago  Substance Use Topics  . Alcohol use: Yes    Alcohol/week: 0.0 oz    Comment: occ  . Drug use: No     Allergies   Patient has no known allergies.   Review of Systems Review of Systems  Constitutional: Negative  for chills and fever.  Respiratory: Negative for chest tightness and shortness of breath.   Cardiovascular: Negative for chest pain.  Gastrointestinal: Negative for nausea and vomiting.  Skin:       Swelling  Neurological: Negative for headaches.  All other systems reviewed and are negative.    Physical Exam Updated Vital Signs BP (!) 153/98   Pulse 79   Temp 99 F (37.2 C) (Oral)   Resp 16   SpO2 96%   Physical Exam  Constitutional: He is oriented to person, place, and time. He appears well-developed and well-nourished.  HENT:  Head: Normocephalic and atraumatic.  Cardiovascular: Normal rate and intact distal pulses.  Pulmonary/Chest: Effort normal. No respiratory distress.  Neurological: He is alert and oriented to person, place, and time.  Skin: Skin is warm and dry. He is not diaphoretic.  There is a 4 cm x 4 cm area of erythema and induration on the superior left buttock, close to midline.  There is a 1 cm x 1 cm area of scab with scant dried purulent drainage around it.  There is fluctuance around the area.   Psychiatric: He has a normal mood and affect. His behavior is normal.  Nursing note and vitals reviewed.    ED Treatments / Results  Labs (all labs ordered are listed, but only abnormal results are displayed) Labs Reviewed - No data to display  EKG  EKG Interpretation None       Radiology No results found.  Procedures .Marland KitchenIncision and Drainage Date/Time: 05/25/2017 4:19 PM Performed by: Lorin Glass, PA-C Authorized by: Lorin Glass, PA-C   Consent:    Consent obtained:  Verbal   Consent given by:  Patient   Risks discussed:  Bleeding, damage to other organs, incomplete drainage, infection and pain (Poor cosmesis)   Alternatives discussed:  No treatment, alternative treatment and referral Location:    Type:  Abscess   Size:  2x2   Location:  Anogenital   Anogenital location:  Gluteal cleft Pre-procedure details:    Procedure  prep: Chlorhexidine. Anesthesia (see MAR for exact dosages):    Anesthesia method:  Local infiltration   Local anesthetic:  Lidocaine 2% WITH epi Procedure type:    Complexity:  Complex Procedure details:    Needle aspiration: no     Incision types:  Stab incision   Incision depth:  Subcutaneous   Scalpel blade:  11   Wound management:  Probed and deloculated and irrigated with saline   Drainage:  Purulent and serosanguinous   Drainage amount:  Scant   Wound treatment:  Wound left open   Packing materials:  None Post-procedure details:    Patient tolerance of procedure:  Tolerated well, no immediate complications   (including critical care time)  Medications Ordered in ED Medications - No data to display Lidocaine 2% with epi  Initial  Impression / Assessment and Plan / ED Course  I have reviewed the triage vital signs and the nursing notes.  Pertinent labs & imaging results that were available during my care of the patient were reviewed by me and considered in my medical decision making (see chart for details).     Patient with skin abscess amenable to incision and drainage.  Abscess was not large enough to warrant packing or drain,  wound recheck in 2 days. Encouraged home warm soaks and flushing.  Mild signs of cellulitis is surrounding skin.  Will d/c to home.  As patient is diabetic, will start him on doxycycline.  He has previously taken that for abscesses in the past and tolerated it well.    Final Clinical Impressions(s) / ED Diagnoses   Final diagnoses:  Abscess    ED Discharge Orders        Ordered    doxycycline (VIBRAMYCIN) 100 MG capsule  2 times daily     05/25/17 0825    HYDROcodone-acetaminophen (NORCO/VICODIN) 5-325 MG tablet  Every 4 hours PRN     05/25/17 0825       Lorin Glass, PA-C 05/25/17 1622    Isla Pence, MD 05/28/17 [phone]

## 2017-05-25 NOTE — ED Triage Notes (Signed)
Pt has an abscess to lower back for the past three days. Denies drainage

## 2017-06-11 ENCOUNTER — Ambulatory Visit: Payer: Self-pay | Attending: Family Medicine | Admitting: Family Medicine

## 2017-06-11 ENCOUNTER — Ambulatory Visit: Payer: Self-pay | Admitting: Family Medicine

## 2017-06-11 ENCOUNTER — Encounter: Payer: Self-pay | Admitting: Family Medicine

## 2017-06-11 VITALS — BP 171/114 | HR 83 | Temp 98.2°F | Ht 73.0 in | Wt 230.0 lb

## 2017-06-11 DIAGNOSIS — I10 Essential (primary) hypertension: Secondary | ICD-10-CM | POA: Insufficient documentation

## 2017-06-11 DIAGNOSIS — J45909 Unspecified asthma, uncomplicated: Secondary | ICD-10-CM | POA: Insufficient documentation

## 2017-06-11 DIAGNOSIS — Z79899 Other long term (current) drug therapy: Secondary | ICD-10-CM | POA: Insufficient documentation

## 2017-06-11 DIAGNOSIS — Z7984 Long term (current) use of oral hypoglycemic drugs: Secondary | ICD-10-CM | POA: Insufficient documentation

## 2017-06-11 DIAGNOSIS — M109 Gout, unspecified: Secondary | ICD-10-CM | POA: Insufficient documentation

## 2017-06-11 DIAGNOSIS — Z9114 Patient's other noncompliance with medication regimen: Secondary | ICD-10-CM | POA: Insufficient documentation

## 2017-06-11 DIAGNOSIS — E119 Type 2 diabetes mellitus without complications: Secondary | ICD-10-CM | POA: Insufficient documentation

## 2017-06-11 DIAGNOSIS — R2689 Other abnormalities of gait and mobility: Secondary | ICD-10-CM

## 2017-06-11 LAB — GLUCOSE, POCT (MANUAL RESULT ENTRY): POC Glucose: 141 mg/dl — AB (ref 70–99)

## 2017-06-11 MED ORDER — ISOSORBIDE MONONITRATE ER 60 MG PO TB24: 60.0000 mg | ORAL_TABLET | Freq: Every day | ORAL | 3 refills | Status: DC

## 2017-06-11 NOTE — Patient Instructions (Signed)

## 2017-06-11 NOTE — Progress Notes (Signed)
Subjective:  Patient ID: Craig West, male    DOB: [birth year]  Age: 52 y.o. MRN: [id number]  CC: Hypertension   HPI Jeremyah Jelley is a 52 year old male with a history of hypertension, type 2 diabetes mellitus (A1c 6.1), asthma, gout, non compliance who presents today with a form from work which requires a clinician clearance to return to work. He had presented to the nurse at work with lightheadedness and had not been taking his antihypertensives with his systolic blood pressure in the 200s. His job is strenuous involving heavy lifting, pulling, using a fork lift. He denies dizziness, chest pains, dyspnea.  Past Medical History:  Diagnosis Date  . Asthma   . Diabetes mellitus without complication ([place])   . Gout   . Hypertension     No past surgical history on file.  No Known Allergies    Outpatient Medications Prior to Visit  Medication Sig Dispense Refill  . albuterol (PROVENTIL HFA;VENTOLIN HFA) 108 (90 Base) MCG/ACT inhaler Inhale 2 puffs into the lungs every 6 (six) hours as needed for wheezing or shortness of breath. 1 Inhaler 2  . allopurinol (ZYLOPRIM) 300 MG tablet Take 1 tablet (300 mg total) by mouth daily. For prevention of gout 30 tablet 2  . amLODipine (NORVASC) 10 MG tablet Take 1 tablet (10 mg total) by mouth daily. For hypertension 90 tablet 0  . atorvastatin (LIPITOR) 20 MG tablet Take 1 tablet (20 mg total) by mouth daily. For hypercholesterolemia 90 tablet 0  . Blood Glucose Monitoring Suppl (TRUE METRIX METER) w/Device KIT Check blood sugar fasting and ant bedtime and record 1 kit 0  . colchicine 0.6 MG tablet Take 2 tablets (1.2 mg) orally at the onset of a gout attack; may repeat 1 tablet (0.6 mg) in 2 hours if symptoms persist 60 tablet 2  . gabapentin (NEURONTIN) 300 MG capsule Take 1 capsule (300 mg total) by mouth 2 (two) times daily. For diabetic neuropathy 60 capsule 2  . glucose blood (TRUE METRIX BLOOD GLUCOSE TEST) test strip Use as instructed 100  each 12  . lisinopril-hydrochlorothiazide (ZESTORETIC) 20-12.5 MG tablet Take 2 tablets by mouth daily. 60 tablet 2  . metFORMIN (GLUCOPHAGE) 1000 MG tablet Take 1 tablet (1,000 mg total) by mouth 2 (two) times daily with a meal. For diabetes mellitus 180 tablet 0  . TRUEPLUS LANCETS 28G MISC Check blood sugar fasting and at bedtime 100 each 2  . HYDROcodone-acetaminophen (NORCO/VICODIN) 5-325 MG tablet Take 1 tablet by mouth every 4 (four) hours as needed. (Patient not taking: Reported on 06/11/2017) 6 tablet 0   No facility-administered medications prior to visit.     ROS Review of Systems  Constitutional: Negative for activity change and appetite change.  HENT: Negative for sinus pressure and sore throat.   Eyes: Negative for visual disturbance.  Respiratory: Negative for cough, chest tightness and shortness of breath.   Cardiovascular: Negative for chest pain and leg swelling.  Gastrointestinal: Negative for abdominal distention, abdominal pain, constipation and diarrhea.  Endocrine: Negative.   Genitourinary: Negative for dysuria.  Musculoskeletal: Negative for joint swelling and myalgias.  Skin: Negative for rash.  Allergic/Immunologic: Negative.   Neurological: Negative for weakness, light-headedness and numbness.  Psychiatric/Behavioral: Negative for dysphoric mood and suicidal ideas.    Objective:  BP (!) 171/114   Pulse 83   Temp 98.2 F (36.8 C) (Oral)   Ht 6' 1"  (1.854 m)   Wt 230 lb (104.3 kg)   SpO2 99%   BMI  30.34 kg/m   BP/Weight 06/11/2017 05/25/2017 2/99/2426  Systolic BP 834 196 222  Diastolic BP 979 98 892  Wt. (Lbs) 230 - 233  BMI 30.34 - 30.74      Physical Exam  Constitutional: He is oriented to person, place, and time. He appears well-developed and well-nourished.  Cardiovascular: Normal rate, normal heart sounds and intact distal pulses.  No murmur heard. Pulmonary/Chest: Effort normal and breath sounds normal. He has no wheezes. He has no  rales. He exhibits no tenderness.  Abdominal: Soft. Bowel sounds are normal. He exhibits no distension and no mass. There is no tenderness.  Musculoskeletal: Normal range of motion.  Neurological: He is alert and oriented to person, place, and time.  Skin: Skin is warm and dry.  Psychiatric: He has a normal mood and affect.    Lab Results  Component Value Date   HGBA1C 6.1 04/18/2017      Assessment & Plan:   1. Type 2 diabetes mellitus without complication, without long-term current use of insulin (HCC) Diet controlled with A1c of 6.1 Counseled on Diabetic diet, my plate method, 119 minutes of moderate intensity exercise/week Keep blood sugar logs with fasting goals of 80-120 mg/dl, random of less than 180 and in the event of sugars less than 60 mg/dl or greater than 400 mg/dl please notify the clinic ASAP. It is recommended that you undergo annual eye exams and annual foot exams. Pneumonia vaccine is recommended. - POCT glucose (manual entry)  2. Essential hypertension Uncontrolled Isosorbide commenced Counseled on blood pressure goal of less than 130/80, low-sodium, DASH diet, medication compliance, 150 minutes of moderate intensity exercise per week. Form complicated indicating patient not work If Dizzy or has severely uncontrolled BP Discussed medication compliance, adverse effects. - CMP14+EGFR   Meds ordered this encounter  Medications  . isosorbide mononitrate (IMDUR) 60 MG 24 hr tablet    Sig: Take 1 tablet (60 mg total) by mouth daily.    Dispense:  30 tablet    Refill:  3    Follow-up: Return for Top of previous medical conditions, keep previously scheduled appointment.Charlott Rakes MD

## 2017-06-11 NOTE — Progress Notes (Signed)
Patient has paperwork.

## 2017-06-12 ENCOUNTER — Telehealth: Payer: Self-pay

## 2017-06-12 LAB — CMP14+EGFR
ALBUMIN: 4.2 g/dL (ref 3.5–5.5)
ALK PHOS: 133 IU/L — AB (ref 39–117)
ALT: 28 IU/L (ref 0–44)
AST: 34 IU/L (ref 0–40)
Albumin/Globulin Ratio: 1.3 (ref 1.2–2.2)
BUN/Creatinine Ratio: 12 (ref 9–20)
BUN: 20 mg/dL (ref 6–24)
Bilirubin Total: 0.4 mg/dL (ref 0.0–1.2)
CALCIUM: 9.3 mg/dL (ref 8.7–10.2)
CO2: 24 mmol/L (ref 20–29)
CREATININE: 1.63 mg/dL — AB (ref 0.76–1.27)
Chloride: 101 mmol/L (ref 96–106)
GFR calc Af Amer: 55 mL/min/{1.73_m2} — ABNORMAL LOW (ref >59)
GFR, EST NON AFRICAN AMERICAN: 47 mL/min/{1.73_m2} — AB (ref >59)
GLUCOSE: 92 mg/dL (ref 65–99)
Globulin, Total: 3.3 g/dL (ref 1.5–4.5)
Potassium: 4.2 mmol/L (ref 3.5–5.2)
Sodium: 142 mmol/L (ref 134–144)
Total Protein: 7.5 g/dL (ref 6.0–8.5)

## 2017-06-12 NOTE — Telephone Encounter (Signed)
Patient was called and informed of lab results. 

## 2017-07-14 ENCOUNTER — Ambulatory Visit: Payer: Self-pay | Admitting: Family Medicine

## 2017-08-31 ENCOUNTER — Encounter (HOSPITAL_COMMUNITY): Payer: Self-pay

## 2017-08-31 ENCOUNTER — Other Ambulatory Visit: Payer: Self-pay

## 2017-08-31 ENCOUNTER — Emergency Department (HOSPITAL_COMMUNITY): Payer: Self-pay

## 2017-08-31 ENCOUNTER — Emergency Department (HOSPITAL_COMMUNITY)
Admission: EM | Admit: 2017-08-31 | Discharge: 2017-08-31 | Disposition: A | Discharge status: Home / Self Care | Payer: Self-pay | Attending: Emergency Medicine | Admitting: Emergency Medicine

## 2017-08-31 DIAGNOSIS — Z7984 Long term (current) use of oral hypoglycemic drugs: Secondary | ICD-10-CM | POA: Insufficient documentation

## 2017-08-31 DIAGNOSIS — I129 Hypertensive chronic kidney disease with stage 1 through stage 4 chronic kidney disease, or unspecified chronic kidney disease: Secondary | ICD-10-CM | POA: Insufficient documentation

## 2017-08-31 DIAGNOSIS — F17210 Nicotine dependence, cigarettes, uncomplicated: Secondary | ICD-10-CM | POA: Insufficient documentation

## 2017-08-31 DIAGNOSIS — M25472 Effusion, left ankle: Secondary | ICD-10-CM

## 2017-08-31 DIAGNOSIS — J45909 Unspecified asthma, uncomplicated: Secondary | ICD-10-CM | POA: Insufficient documentation

## 2017-08-31 DIAGNOSIS — Z79899 Other long term (current) drug therapy: Secondary | ICD-10-CM | POA: Insufficient documentation

## 2017-08-31 DIAGNOSIS — N189 Chronic kidney disease, unspecified: Secondary | ICD-10-CM | POA: Insufficient documentation

## 2017-08-31 DIAGNOSIS — R2242 Localized swelling, mass and lump, left lower limb: Secondary | ICD-10-CM | POA: Insufficient documentation

## 2017-08-31 DIAGNOSIS — E1122 Type 2 diabetes mellitus with diabetic chronic kidney disease: Secondary | ICD-10-CM | POA: Insufficient documentation

## 2017-08-31 LAB — BASIC METABOLIC PANEL
Anion gap: 11 (ref 5–15)
BUN: 23 mg/dL — AB (ref 6–20)
CALCIUM: 8.6 mg/dL — AB (ref 8.9–10.3)
CO2: 25 mmol/L (ref 22–32)
CREATININE: 2.03 mg/dL — AB (ref 0.61–1.24)
Chloride: 100 mmol/L — ABNORMAL LOW (ref 101–111)
GFR calc Af Amer: 41 mL/min — ABNORMAL LOW (ref >60)
GFR, EST NON AFRICAN AMERICAN: 36 mL/min — AB (ref >60)
GLUCOSE: 148 mg/dL — AB (ref 65–99)
POTASSIUM: 3.2 mmol/L — AB (ref 3.5–5.1)
Sodium: 136 mmol/L (ref 135–145)

## 2017-08-31 LAB — CBC
HCT: 44.1 % (ref 39.0–52.0)
Hemoglobin: 14.3 g/dL (ref 13.0–17.0)
MCH: 28.8 pg (ref 26.0–34.0)
MCHC: 32.4 g/dL (ref 30.0–36.0)
MCV: 88.7 fL (ref 78.0–100.0)
PLATELETS: 223 10*3/uL (ref 150–400)
RBC: 4.97 MIL/uL (ref 4.22–5.81)
RDW: 13.5 % (ref 11.5–15.5)
WBC: 16.5 10*3/uL — ABNORMAL HIGH (ref 4.0–10.5)

## 2017-08-31 LAB — CBG MONITORING, ED: GLUCOSE-CAPILLARY: 153 mg/dL — AB (ref 65–99)

## 2017-08-31 MED ORDER — HYDROCODONE-ACETAMINOPHEN 5-325 MG PO TABS
1.0000 | ORAL_TABLET | Freq: Once | ORAL | Status: AC
  Administered 2017-08-31: 1 via ORAL
  Filled 2017-08-31: qty 1

## 2017-08-31 MED ORDER — IBUPROFEN 400 MG PO TABS
400.0000 mg | ORAL_TABLET | Freq: Once | ORAL | Status: AC | PRN
  Administered 2017-08-31: 400 mg via ORAL
  Filled 2017-08-31: qty 1

## 2017-08-31 NOTE — ED Notes (Signed)
Unable to obtain e-sign d/t equipment malfunction.  Pt verbalized understanding of d/c instructions, follow up and return precautions.

## 2017-08-31 NOTE — ED Notes (Signed)
Pt is irate that ortho tech is with another pt and has not been able to apply the cam walker.  Pt states, "I need to get home to see the game."  Attempted service recovery however was unsuccessful.  Pt requesting to leave w/o cam walker.  Will inform provider and d/c.

## 2017-08-31 NOTE — ED Provider Notes (Signed)
[facility] EMERGENCY DEPARTMENT Provider Note   CSN: [id number] Arrival date & time: 08/31/17  1452     History   Chief Complaint Chief Complaint  Patient presents with  . Leg Injury    HPI Craig West is a 52 y.o. male.  HPI  Patient is a 52 year old male comes in today complaining of left ankle pain.  Patient states he was working yesterday when he stepped on a hole and rolled his ankle.  Patient continued to work but noted that he was having increasing pain.  Patient states when he got home he took off his boot his ankle was diffusely swollen and tender.  Patient denies any fevers chills nausea vomiting lightheadedness or dizziness.  Patient denies any weakness or numbness.  Past Medical History:  Diagnosis Date  . Asthma   . Diabetes mellitus without complication ([place])   . Gout   . Hypertension     Patient Active Problem List   Diagnosis Date Noted  . Non compliance w medication regimen 04/18/2017  . Gout 08/02/2016  . Diabetic neuropathy ([place]) 08/02/2016  . Chronic kidney disease 08/02/2016  . Hyperlipidemia 10/24/2015  . Asthma 10/16/2015  . Type 2 diabetes mellitus without complication ([place]) 83/41/9622  . Essential hypertension 06/13/2014  . Smoker 06/13/2014    History reviewed. No pertinent surgical history.      Home Medications    Prior to Admission medications   Medication Sig Start Date End Date Taking? Authorizing Provider  albuterol (PROVENTIL HFA;VENTOLIN HFA) 108 (90 Base) MCG/ACT inhaler Inhale 2 puffs into the lungs every 6 (six) hours as needed for wheezing or shortness of breath. 05/15/17   Charlott Rakes, MD  allopurinol (ZYLOPRIM) 300 MG tablet Take 1 tablet (300 mg total) by mouth daily. For prevention of gout 05/15/17   Charlott Rakes, MD  amLODipine (NORVASC) 10 MG tablet Take 1 tablet (10 mg total) by mouth daily. For hypertension 05/15/17   Charlott Rakes, MD  atorvastatin (LIPITOR) 20 MG tablet Take 1 tablet (20 mg  total) by mouth daily. For hypercholesterolemia 05/15/17   Charlott Rakes, MD  Blood Glucose Monitoring Suppl (TRUE METRIX METER) w/Device KIT Check blood sugar fasting and ant bedtime and record 05/15/17   Charlott Rakes, MD  colchicine 0.6 MG tablet Take 2 tablets (1.2 mg) orally at the onset of a gout attack; may repeat 1 tablet (0.6 mg) in 2 hours if symptoms persist 05/15/17   Charlott Rakes, MD  gabapentin (NEURONTIN) 300 MG capsule Take 1 capsule (300 mg total) by mouth 2 (two) times daily. For diabetic neuropathy 05/15/17   Charlott Rakes, MD  glucose blood (TRUE METRIX BLOOD GLUCOSE TEST) test strip Use as instructed 05/15/17   Charlott Rakes, MD  HYDROcodone-acetaminophen (NORCO/VICODIN) 5-325 MG tablet Take 1 tablet by mouth every 4 (four) hours as needed. Patient not taking: Reported on 06/11/2017 05/25/17   Lorin Glass, PA-C  isosorbide mononitrate (IMDUR) 60 MG 24 hr tablet Take 1 tablet (60 mg total) by mouth daily. 06/11/17   Charlott Rakes, MD  lisinopril-hydrochlorothiazide (ZESTORETIC) 20-12.5 MG tablet Take 2 tablets by mouth daily. 05/15/17   Charlott Rakes, MD  metFORMIN (GLUCOPHAGE) 1000 MG tablet Take 1 tablet (1,000 mg total) by mouth 2 (two) times daily with a meal. For diabetes mellitus 05/15/17   Charlott Rakes, MD  TRUEPLUS LANCETS 28G MISC Check blood sugar fasting and at bedtime 05/15/17   Charlott Rakes, MD    Family History No family history on file.  Social History Social History   Tobacco Use  . Smoking status: Current Every Day Smoker    Packs/day: 0.50    Types: Cigarettes  . Smokeless tobacco: Never Used  . Tobacco comment: Pt states he started smoking again 7 years ago  Substance Use Topics  . Alcohol use: Yes    Alcohol/week: 0.0 oz    Comment: occ  . Drug use: No     Allergies   Patient has no known allergies.   Review of Systems Review of Systems  Musculoskeletal: Positive for joint swelling.  Neurological: Negative for weakness  and numbness.  All other systems reviewed and are negative.    Physical Exam Updated Vital Signs BP (!) 183/124 (BP Location: Left Arm)   Pulse 80   Temp 98.6 F (37 C) (Oral)   Resp 16   Ht 6' 1"  (1.854 m)   Wt 106.6 kg (235 lb)   SpO2 98%   BMI 31.00 kg/m   Physical Exam  Constitutional: He appears well-developed and well-nourished.  HENT:  Head: Normocephalic and atraumatic.  Eyes: Conjunctivae are normal.  Neck: Neck supple.  Cardiovascular: Normal rate and regular rhythm.  No murmur heard. Pulmonary/Chest: Effort normal and breath sounds normal. No respiratory distress.  Abdominal: Soft. There is no tenderness.  Musculoskeletal: He exhibits edema and tenderness. He exhibits no deformity.  Left ankle diffusely swollen, but with normal range of motion.  Patient neurovascularly intact.  No bony abnormality appreciated.  Neurological: He is alert.  Skin: Skin is warm and dry.  Psychiatric: He has a normal mood and affect.  Nursing note and vitals reviewed.    ED Treatments / Results  Labs (all labs ordered are listed, but only abnormal results are displayed) Labs Reviewed  BASIC METABOLIC PANEL - Abnormal; Notable for the following components:      Result Value   Potassium 3.2 (*)    Chloride 100 (*)    Glucose, Bld 148 (*)    BUN 23 (*)    Creatinine, Ser 2.03 (*)    Calcium 8.6 (*)    GFR calc non Af Amer 36 (*)    GFR calc Af Amer 41 (*)    All other components within normal limits  CBC - Abnormal; Notable for the following components:   WBC 16.5 (*)    All other components within normal limits  CBG MONITORING, ED - Abnormal; Notable for the following components:   Glucose-Capillary 153 (*)    All other components within normal limits  URINALYSIS, ROUTINE W REFLEX MICROSCOPIC  CBG MONITORING, ED    EKG None  Radiology Dg Ankle Complete Left  Result Date: 08/31/2017 CLINICAL DATA:  Lateral left ankle pain and swelling since a twisting injury  today. Initial encounter. EXAM: LEFT ANKLE COMPLETE - 3+ VIEW COMPARISON:  None. FINDINGS: No acute bony or joint abnormality is identified. 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 Soft tissues appear mildly swollen. No tibiotalar joint effusion. IMPRESSION: No acute bony abnormality. Soft tissues about the ankle appear mildly swollen. 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 Electronically Signed   By: Inge Rise M.D.   On: 08/31/2017 16:17    Procedures Procedures (including critical care time)  Medications Ordered in ED Medications  ibuprofen (ADVIL,MOTRIN) tablet 400 mg (400 mg Oral Given 08/31/17 1533)  HYDROcodone-acetaminophen (NORCO/VICODIN) 5-325 MG per tablet 1 tablet (1 tablet Oral Given 08/31/17 2013)     Initial Impression / Assessment and Plan / ED Course  I have reviewed the  triage vital signs and the nursing notes.  Pertinent labs & imaging results that were available during my care of the patient were reviewed by me and considered in my medical decision making (see chart for details).     X-ray shows no acute fracture or malalignment.  Patient insisted that he has to be able to work and be able to walk in order to do so.  We will get patient to Ortho boot for better mobility. Pt given appropriate f/u and return precautions. Pt voiced understanding and is agreeable to discharge at this time.    Final Clinical Impressions(s) / ED Diagnoses   Final diagnoses:  Left ankle swelling    ED Discharge Orders    None       Chapman Moss, MD 08/31/17 2351    Elnora Morrison, MD 08/31/17 2352

## 2017-08-31 NOTE — ED Triage Notes (Signed)
Pt states that he was working yesterday and stepped in a hole and thought he had just rolled it until he took his boot off late last night and realized how swollen it was. Pt reports increasing pain today.

## 2017-08-31 NOTE — ED Notes (Signed)
Patient asked for urine sample. States that he is unable to give sample at this time.

## 2017-09-01 NOTE — Progress Notes (Signed)
Orthopedic Tech Progress Note Patient Details:  Craig West 05-16-1965 [id number]  Ortho Devices Type of Ortho Device: CAM walker Ortho Device/Splint Location: lle. applied as per drs verbal order. Ortho Device/Splint Interventions: Ordered, Application, Adjustment   Post Interventions Patient Tolerated: Well Instructions Provided: Care of device, Adjustment of device   Karolee Stamps 09/01/2017, 12:02 AM

## 2017-09-02 MED FILL — !COLCRYS 0.6 MG TABLET: 0.6 MG | 15 days supply | Qty: 30 | Fill #1

## 2017-09-02 MED FILL — TRUEplus LANCETS 28G MISC: 30 days supply | Qty: 100 | Fill #1

## 2017-09-02 MED FILL — AMLODIPINE BESYLATE 10 MG T: 10 | 30 days supply | Qty: 30 | Fill #1

## 2017-09-02 MED FILL — LISINOPRIL-HCTZ 20-12.5 MG: 20-12.5 | 30 days supply | Qty: 60 | Fill #1

## 2017-09-02 MED FILL — GABAPENTIN 300 MG CAPSULE: 300 | 30 days supply | Qty: 60 | Fill #1

## 2017-09-02 MED FILL — TRUE METRIX TEST STRIP: 30 days supply | Qty: 100 | Fill #1

## 2017-09-02 MED FILL — !VENTOLIN HFA INHALER: 108 (90 BAS | 25 days supply | Qty: 18 | Fill #1

## 2017-09-02 MED FILL — ISOSORBIDE MN ER 60 MG TAB: 60 | 30 days supply | Qty: 30 | Fill #0

## 2017-09-02 MED FILL — ATORVASTATIN 20 MG TABLET: 20 | 30 days supply | Qty: 30 | Fill #1

## 2017-09-02 MED FILL — ALLOPURINOL 300 MG TAB: 300 | 30 days supply | Qty: 30 | Fill #1

## 2017-09-08 ENCOUNTER — Ambulatory Visit: Payer: Self-pay | Attending: Family Medicine | Admitting: Family Medicine

## 2017-09-08 ENCOUNTER — Encounter: Payer: Self-pay | Admitting: Family Medicine

## 2017-09-08 VITALS — BP 160/120 | HR 78 | Temp 97.9°F | Ht 73.0 in | Wt 241.0 lb

## 2017-09-08 DIAGNOSIS — M109 Gout, unspecified: Secondary | ICD-10-CM | POA: Insufficient documentation

## 2017-09-08 DIAGNOSIS — Z9114 Patient's other noncompliance with medication regimen: Secondary | ICD-10-CM | POA: Insufficient documentation

## 2017-09-08 DIAGNOSIS — J4520 Mild intermittent asthma, uncomplicated: Secondary | ICD-10-CM | POA: Insufficient documentation

## 2017-09-08 DIAGNOSIS — E1149 Type 2 diabetes mellitus with other diabetic neurological complication: Secondary | ICD-10-CM

## 2017-09-08 DIAGNOSIS — I10 Essential (primary) hypertension: Secondary | ICD-10-CM

## 2017-09-08 DIAGNOSIS — E1122 Type 2 diabetes mellitus with diabetic chronic kidney disease: Secondary | ICD-10-CM | POA: Insufficient documentation

## 2017-09-08 DIAGNOSIS — N183 Chronic kidney disease, stage 3 unspecified: Secondary | ICD-10-CM

## 2017-09-08 DIAGNOSIS — Z114 Encounter for screening for human immunodeficiency virus [HIV]: Secondary | ICD-10-CM

## 2017-09-08 DIAGNOSIS — E119 Type 2 diabetes mellitus without complications: Secondary | ICD-10-CM

## 2017-09-08 DIAGNOSIS — E7800 Pure hypercholesterolemia, unspecified: Secondary | ICD-10-CM | POA: Insufficient documentation

## 2017-09-08 DIAGNOSIS — Z7984 Long term (current) use of oral hypoglycemic drugs: Secondary | ICD-10-CM | POA: Insufficient documentation

## 2017-09-08 DIAGNOSIS — Z79899 Other long term (current) drug therapy: Secondary | ICD-10-CM | POA: Insufficient documentation

## 2017-09-08 DIAGNOSIS — M25572 Pain in left ankle and joints of left foot: Secondary | ICD-10-CM | POA: Insufficient documentation

## 2017-09-08 DIAGNOSIS — I129 Hypertensive chronic kidney disease with stage 1 through stage 4 chronic kidney disease, or unspecified chronic kidney disease: Secondary | ICD-10-CM | POA: Insufficient documentation

## 2017-09-08 DIAGNOSIS — R399 Unspecified symptoms and signs involving the genitourinary system: Secondary | ICD-10-CM

## 2017-09-08 DIAGNOSIS — E1140 Type 2 diabetes mellitus with diabetic neuropathy, unspecified: Secondary | ICD-10-CM | POA: Insufficient documentation

## 2017-09-08 LAB — GLUCOSE, POCT (MANUAL RESULT ENTRY): POC GLUCOSE: 132 mg/dL — AB (ref 70–99)

## 2017-09-08 LAB — POCT GLYCOSYLATED HEMOGLOBIN (HGB A1C): HBA1C, POC (CONTROLLED DIABETIC RANGE): 6.5 % (ref 0.0–7.0)

## 2017-09-08 MED ORDER — GABAPENTIN 300 MG PO CAPS: 300.0000 mg | ORAL_CAPSULE | Freq: Two times a day (BID) | ORAL | 6 refills | Status: DC

## 2017-09-08 MED ORDER — ALLOPURINOL 300 MG PO TABS
300.0000 mg | ORAL_TABLET | Freq: Every day | ORAL | 6 refills | Status: DC
Start: 2017-09-08 — End: 2017-12-09

## 2017-09-08 MED ORDER — ATORVASTATIN CALCIUM 20 MG PO TABS: 20.0000 mg | ORAL_TABLET | Freq: Every day | ORAL | 6 refills | Status: DC

## 2017-09-08 MED ORDER — TAMSULOSIN HCL 0.4 MG PO CAPS: 0.4000 mg | ORAL_CAPSULE | Freq: Every day | ORAL | 3 refills | Status: DC

## 2017-09-08 MED ORDER — LISINOPRIL-HYDROCHLOROTHIAZIDE 20-12.5 MG PO TABS: 2.0000 | ORAL_TABLET | Freq: Every day | ORAL | 6 refills | Status: DC

## 2017-09-08 MED ORDER — DICLOFENAC SODIUM 1 % TD GEL: 4.0000 g | Freq: Four times a day (QID) | TRANSDERMAL | 1 refills | Status: DC

## 2017-09-08 MED ORDER — ISOSORBIDE MONONITRATE ER 60 MG PO TB24: 60.0000 mg | ORAL_TABLET | Freq: Every day | ORAL | 3 refills | Status: DC

## 2017-09-08 MED ORDER — AMLODIPINE BESYLATE 10 MG PO TABS: 10.0000 mg | ORAL_TABLET | Freq: Every day | ORAL | 6 refills | Status: DC

## 2017-09-08 NOTE — Progress Notes (Signed)
Subjective:  Patient ID: Craig West, male    DOB: 1966-03-27  Age: 52 y.o. MRN: [id number]  CC: Diabetes and Hypertension   HPI Craig West is a 52 year old male with a history of hypertension, type 2 diabetes mellitus (A1c 6.5), asthma, gout, non compliance here for follow-up visit. He had an ED visit 1 week ago for left ankle pain and swelling after he had accidentally stepped in a hole.  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  He was prescribed a left ankle boot which he has not been wearing due to the inconvenience of wearing it at work.  His left ankle continues to hurt.  His blood pressure is elevated and he informs me he has been out of his medications for the last 1 week.  He has not been taking his metformin for diabetes and informs me his diabetes has always been good; his diabetic neuropathy has also been controlled.  He denies gout flares. He does have urinary urgency and nocturia but denies hesitancy, dribbling, abdominal pain. Denies asthma exacerbations.  Past Medical History:  Diagnosis Date  . Asthma   . Diabetes mellitus without complication ([place])   . Gout   . Hypertension     History reviewed. No pertinent surgical history.  No Known Allergies   Outpatient Medications Prior to Visit  Medication Sig Dispense Refill  . albuterol (PROVENTIL HFA;VENTOLIN HFA) 108 (90 Base) MCG/ACT inhaler Inhale 2 puffs into the lungs every 6 (six) hours as needed for wheezing or shortness of breath. 1 Inhaler 2  . Blood Glucose Monitoring Suppl (TRUE METRIX METER) w/Device KIT Check blood sugar fasting and ant bedtime and record 1 kit 0  . colchicine 0.6 MG tablet Take 2 tablets (1.2 mg) orally at the onset of a gout attack; may repeat 1 tablet (0.6 mg) in 2 hours if symptoms persist 60 tablet 2  . glucose blood (TRUE METRIX BLOOD GLUCOSE TEST) test strip Use as instructed 100 each 12  . TRUEPLUS LANCETS 28G MISC Check  blood sugar fasting and at bedtime 100 each 2  . allopurinol (ZYLOPRIM) 300 MG tablet Take 1 tablet (300 mg total) by mouth daily. For prevention of gout 30 tablet 2  . amLODipine (NORVASC) 10 MG tablet Take 1 tablet (10 mg total) by mouth daily. For hypertension 90 tablet 0  . atorvastatin (LIPITOR) 20 MG tablet Take 1 tablet (20 mg total) by mouth daily. For hypercholesterolemia 90 tablet 0  . gabapentin (NEURONTIN) 300 MG capsule Take 1 capsule (300 mg total) by mouth 2 (two) times daily. For diabetic neuropathy 60 capsule 2  . isosorbide mononitrate (IMDUR) 60 MG 24 hr tablet Take 1 tablet (60 mg total) by mouth daily. 30 tablet 3  . lisinopril-hydrochlorothiazide (ZESTORETIC) 20-12.5 MG tablet Take 2 tablets by mouth daily. 60 tablet 2  . HYDROcodone-acetaminophen (NORCO/VICODIN) 5-325 MG tablet Take 1 tablet by mouth every 4 (four) hours as needed. (Patient not taking: Reported on 06/11/2017) 6 tablet 0  . metFORMIN (GLUCOPHAGE) 1000 MG tablet Take 1 tablet (1,000 mg total) by mouth 2 (two) times daily with a meal. For diabetes mellitus (Patient not taking: Reported on 09/08/2017) 180 tablet 0   No facility-administered medications prior to visit.     ROS Review of Systems  Constitutional: Negative for activity change and appetite change.  HENT: Negative for sinus pressure and sore throat.   Eyes: Negative for visual disturbance.  Respiratory: Negative for cough, chest tightness and  shortness of breath.   Cardiovascular: Negative for chest pain and leg swelling.  Gastrointestinal: Negative for abdominal distention, abdominal pain, constipation and diarrhea.  Endocrine: Negative.   Genitourinary: Negative for dysuria.  Musculoskeletal:       See hpi   Skin: Negative for rash.  Allergic/Immunologic: Negative.   Neurological: Negative for weakness, light-headedness and numbness.  Psychiatric/Behavioral: Negative for dysphoric mood and suicidal ideas.    Objective:  BP (!) 160/120    Pulse 78   Temp 97.9 F (36.6 C) (Oral)   Ht 6' 1"  (1.854 m)   Wt 241 lb (109.3 kg)   SpO2 98%   BMI 31.80 kg/m   BP/Weight 09/08/2017 08/31/2017 1/70/0174  Systolic BP 944 967 591  Diastolic BP 638 466 599  Wt. (Lbs) 241 235 230  BMI 31.8 31 30.34      Physical Exam  Constitutional: He is oriented to person, place, and time. He appears well-developed and well-nourished.  Cardiovascular: Normal rate, normal heart sounds and intact distal pulses.  No murmur heard. Pulmonary/Chest: Effort normal and breath sounds normal. He has no wheezes. He has no rales. He exhibits no tenderness.  Abdominal: Soft. Bowel sounds are normal. He exhibits no distension and no mass. There is no tenderness.  Musculoskeletal: Normal range of motion. He exhibits edema (left lateral malleolus) and tenderness (left lateral malleolus).  Neurological: He is alert and oriented to person, place, and time.  Skin: Skin is warm and dry.  Psychiatric: He has a normal mood and affect.    CMP Latest Ref Rng & Units 08/31/2017 06/11/2017 01/08/2017  Glucose 65 - 99 mg/dL 148(H) 92 102(H)  BUN 6 - 20 mg/dL 23(H) 20 19  Creatinine 0.61 - 1.24 mg/dL 2.03(H) 1.63(H) 1.52(H)  Sodium 135 - 145 mmol/L 136 142 139  Potassium 3.5 - 5.1 mmol/L 3.2(L) 4.2 4.3  Chloride 101 - 111 mmol/L 100(L) 101 99  CO2 22 - 32 mmol/L 25 24 25   Calcium 8.9 - 10.3 mg/dL 8.6(L) 9.3 9.0  Total Protein 6.0 - 8.5 g/dL - 7.5 7.3  Total Bilirubin 0.0 - 1.2 mg/dL - 0.4 0.6  Alkaline Phos 39 - 117 IU/L - 133(H) 106  AST 0 - 40 IU/L - 34 21  ALT 0 - 44 IU/L - 28 27    Lipid Panel     Component Value Date/Time   CHOL 194 10/23/2015 1009   TRIG 229 (H) 10/23/2015 1009   HDL 37 (L) 10/23/2015 1009   CHOLHDL 5.2 (H) 10/23/2015 1009   VLDL 46 (H) 10/23/2015 1009   LDLCALC 111 10/23/2015 1009    Lab Results  Component Value Date   HGBA1C 6.5 09/08/2017     Assessment & Plan:   1. Type 2 diabetes mellitus without complication, without  long-term current use of insulin (HCC) Controlled with A1c of 6.5 Previously on metformin which have discontinued due to CKD - POCT glucose (manual entry) - POCT glycosylated hemoglobin (Hb A1C) - atorvastatin (LIPITOR) 20 MG tablet; Take 1 tablet (20 mg total) by mouth daily. For hypercholesterolemia  Dispense: 30 tablet; Refill: 6  2. Essential hypertension Uncontrolled due to noncompliance Counseled on blood pressure goal of less than 130/80, low-sodium, DASH diet, medication compliance, 150 minutes of moderate intensity exercise per week. Discussed medication compliance, adverse effects. - amLODipine (NORVASC) 10 MG tablet; Take 1 tablet (10 mg total) by mouth daily. For hypertension  Dispense: 30 tablet; Refill: 6 - isosorbide mononitrate (IMDUR) 60 MG 24 hr tablet; Take  1 tablet (60 mg total) by mouth daily.  Dispense: 30 tablet; Refill: 3 - lisinopril-hydrochlorothiazide (ZESTORETIC) 20-12.5 MG tablet; Take 2 tablets by mouth daily.  Dispense: 60 tablet; Refill: 6  3. Other diabetic neurological complication associated with type 2 diabetes mellitus (HCC) Stable - gabapentin (NEURONTIN) 300 MG capsule; Take 1 capsule (300 mg total) by mouth 2 (two) times daily. For diabetic neuropathy  Dispense: 60 capsule; Refill: 6  4. Mild intermittent asthma without complication No exacerbations  5. Non compliance w medication regimen Emphasized the need to be compliant with current regimen and I have again explained to him the consequences and complications of noncompliance  6. 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 Acute left ankle pain Ankle sprain Wrapped with Ace wrap Rest, apply ice, elevate ankle as much as possible Unable to use oral NSAIDs due to chronic kidney disease - diclofenac sodium (VOLTAREN) 1 % GEL; Apply 4 g topically 4 (four) times daily.  Dispense: 100 g; Refill: 1  8.  Lower urinary tract symptoms Placed on  Flomax  Meds ordered this encounter  Medications  . allopurinol (ZYLOPRIM) 300 MG tablet    Sig: Take 1 tablet (300 mg total) by mouth daily. For prevention of gout    Dispense:  30 tablet    Refill:  6  . amLODipine (NORVASC) 10 MG tablet    Sig: Take 1 tablet (10 mg total) by mouth daily. For hypertension    Dispense:  30 tablet    Refill:  6  . atorvastatin (LIPITOR) 20 MG tablet    Sig: Take 1 tablet (20 mg total) by mouth daily. For hypercholesterolemia    Dispense:  30 tablet    Refill:  6  . gabapentin (NEURONTIN) 300 MG capsule    Sig: Take 1 capsule (300 mg total) by mouth 2 (two) times daily. For diabetic neuropathy    Dispense:  60 capsule    Refill:  6  . isosorbide mononitrate (IMDUR) 60 MG 24 hr tablet    Sig: Take 1 tablet (60 mg total) by mouth daily.    Dispense:  30 tablet    Refill:  3  . lisinopril-hydrochlorothiazide (ZESTORETIC) 20-12.5 MG tablet    Sig: Take 2 tablets by mouth daily.    Dispense:  60 tablet    Refill:  6  . diclofenac sodium (VOLTAREN) 1 % GEL    Sig: Apply 4 g topically 4 (four) times daily.    Dispense:  100 g    Refill:  1    Follow-up: Return in about 3 months (around 12/09/2017) for follow up on chronic medical conditions.   Charlott Rakes MD

## 2017-09-09 LAB — CMP14+EGFR
A/G RATIO: 1.2 (ref 1.2–2.2)
ALBUMIN: 3.7 g/dL (ref 3.5–5.5)
ALT: 26 IU/L (ref 0–44)
AST: 21 IU/L (ref 0–40)
Alkaline Phosphatase: 130 IU/L — ABNORMAL HIGH (ref 39–117)
BUN/Creatinine Ratio: 10 (ref 9–20)
BUN: 20 mg/dL (ref 6–24)
Bilirubin Total: 0.3 mg/dL (ref 0.0–1.2)
CALCIUM: 8.5 mg/dL — AB (ref 8.7–10.2)
CO2: 24 mmol/L (ref 20–29)
CREATININE: 1.92 mg/dL — AB (ref 0.76–1.27)
Chloride: 100 mmol/L (ref 96–106)
GFR, EST AFRICAN AMERICAN: 45 mL/min/{1.73_m2} — AB (ref >59)
GFR, EST NON AFRICAN AMERICAN: 39 mL/min/{1.73_m2} — AB (ref >59)
Globulin, Total: 3.2 g/dL (ref 1.5–4.5)
Glucose: 116 mg/dL — ABNORMAL HIGH (ref 65–99)
Potassium: 4 mmol/L (ref 3.5–5.2)
SODIUM: 139 mmol/L (ref 134–144)
TOTAL PROTEIN: 6.9 g/dL (ref 6.0–8.5)

## 2017-09-09 LAB — HIV ANTIBODY (ROUTINE TESTING W REFLEX): HIV Screen 4th Generation wRfx: NONREACTIVE

## 2017-09-09 LAB — LIPID PANEL
Chol/HDL Ratio: 3.2 ratio (ref 0.0–5.0)
Cholesterol, Total: 138 mg/dL (ref 100–199)
HDL: 43 mg/dL (ref >39)
LDL Calculated: 58 mg/dL (ref 0–99)
TRIGLYCERIDES: 184 mg/dL — AB (ref 0–149)
VLDL CHOLESTEROL CAL: 37 mg/dL (ref 5–40)

## 2017-09-09 LAB — MICROALBUMIN / CREATININE URINE RATIO
Creatinine, Urine: 188.8 mg/dL
MICROALBUM., U, RANDOM: 1638.3 ug/mL
Microalb/Creat Ratio: 867.7 mg/g creat — ABNORMAL HIGH (ref 0.0–30.0)

## 2017-09-10 ENCOUNTER — Telehealth: Payer: Self-pay | Admitting: Family Medicine

## 2017-09-10 NOTE — Telephone Encounter (Signed)
Patient called requesting his Lab Results  Thank You

## 2017-09-10 NOTE — Telephone Encounter (Signed)
Patient name and DOB verified. He is aware of his results from his lab test. Verbalized understanding.

## 2017-09-16 ENCOUNTER — Ambulatory Visit: Payer: Self-pay

## 2017-11-03 ENCOUNTER — Emergency Department (HOSPITAL_COMMUNITY)
Admission: EM | Admit: 2017-11-03 | Discharge: 2017-11-03 | Disposition: A | Discharge status: Home / Self Care | Payer: Self-pay | Attending: Emergency Medicine | Admitting: Emergency Medicine

## 2017-11-03 ENCOUNTER — Other Ambulatory Visit: Payer: Self-pay

## 2017-11-03 ENCOUNTER — Encounter (HOSPITAL_COMMUNITY): Payer: Self-pay

## 2017-11-03 DIAGNOSIS — Z5321 Procedure and treatment not carried out due to patient leaving prior to being seen by health care provider: Secondary | ICD-10-CM | POA: Insufficient documentation

## 2017-11-03 DIAGNOSIS — I10 Essential (primary) hypertension: Secondary | ICD-10-CM | POA: Insufficient documentation

## 2017-11-03 DIAGNOSIS — R51 Headache: Secondary | ICD-10-CM | POA: Insufficient documentation

## 2017-11-03 LAB — COMPREHENSIVE METABOLIC PANEL
ALT: 45 U/L — ABNORMAL HIGH (ref 0–44)
ANION GAP: 11 (ref 5–15)
AST: 44 U/L — AB (ref 15–41)
Albumin: 3.2 g/dL — ABNORMAL LOW (ref 3.5–5.0)
Alkaline Phosphatase: 113 U/L (ref 38–126)
BUN: 22 mg/dL — ABNORMAL HIGH (ref 6–20)
CALCIUM: 8.7 mg/dL — AB (ref 8.9–10.3)
CHLORIDE: 100 mmol/L (ref 98–111)
CO2: 29 mmol/L (ref 22–32)
Creatinine, Ser: 2.3 mg/dL — ABNORMAL HIGH (ref 0.61–1.24)
GFR calc Af Amer: 35 mL/min — ABNORMAL LOW (ref >60)
GFR calc non Af Amer: 30 mL/min — ABNORMAL LOW (ref >60)
Glucose, Bld: 247 mg/dL — ABNORMAL HIGH (ref 70–99)
Potassium: 3.6 mmol/L (ref 3.5–5.1)
Sodium: 140 mmol/L (ref 135–145)
Total Bilirubin: 0.8 mg/dL (ref 0.3–1.2)
Total Protein: 6.9 g/dL (ref 6.5–8.1)

## 2017-11-03 LAB — CBC WITH DIFFERENTIAL/PLATELET
ABS IMMATURE GRANULOCYTES: 0.1 10*3/uL (ref 0.0–0.1)
Basophils Absolute: 0.1 10*3/uL (ref 0.0–0.1)
Basophils Relative: 1 %
Eosinophils Absolute: 0.4 10*3/uL (ref 0.0–0.7)
Eosinophils Relative: 4 %
HCT: 42.8 % (ref 39.0–52.0)
HEMOGLOBIN: 13.5 g/dL (ref 13.0–17.0)
Immature Granulocytes: 1 %
Lymphocytes Relative: 22 %
Lymphs Abs: 2.4 10*3/uL (ref 0.7–4.0)
MCH: 29.2 pg (ref 26.0–34.0)
MCHC: 31.5 g/dL (ref 30.0–36.0)
MCV: 92.4 fL (ref 78.0–100.0)
MONO ABS: 0.6 10*3/uL (ref 0.1–1.0)
MONOS PCT: 6 %
NEUTROS ABS: 7.5 10*3/uL (ref 1.7–7.7)
Neutrophils Relative %: 68 %
Platelets: 208 10*3/uL (ref 150–400)
RBC: 4.63 MIL/uL (ref 4.22–5.81)
RDW: 14.8 % (ref 11.5–15.5)
WBC: 11.1 10*3/uL — ABNORMAL HIGH (ref 4.0–10.5)

## 2017-11-03 NOTE — ED Provider Notes (Signed)
Patient placed in Quick Look pathway, seen and evaluated   Chief Complaint: generalized weakness and headache.   HPI:   Craig West is a 52 y.o. male who parents to the ED with generalized weakness and headache. The symptoms started 2 days ago and have gotten worse.  Patient reports being off his medications for 2 weeks because he has not been [facility].   ROS: Neuro: headache, dizziness    Physical Exam:  BP (!) 224/133 (BP Location: Right Arm)   Pulse 92   Temp 99.4 F (37.4 C) (Oral)   Resp 16   Ht 6\' 1"  (1.854 m)   Wt 108.9 kg (240 lb)   SpO2 96%   BMI 31.66 kg/m    Gen: No distress  Neuro: Awake and Alert, patient ambulated without assistance  Skin: Warm and dry   Patient appears sleepy.    Initiation of care has begun. The patient has been counseled on the process, plan, and necessity for staying for the completion/evaluation, and the remainder of the medical screening examination    Ashley [place], NP 11/03/17 0263    Varney Biles, MD 11/14/17 1048

## 2017-11-03 NOTE — ED Notes (Signed)
Pt came to front desk and stated "I'm leaving" Pt encouraged to stay but did not. Observed to be walking out the door, did not appear to be in NAD.

## 2017-11-03 NOTE — ED Triage Notes (Addendum)
Pt endorses headache x 3 days, hypertensive in triage has hx of same and is NOT taking medications. Pt states "i've been blacking out" VSS. Neuro intact. Pt keeps falling asleep in triage and states "i'm always sleepy"

## 2017-12-03 ENCOUNTER — Ambulatory Visit: Payer: Self-pay | Attending: Family Medicine | Admitting: Licensed Clinical Social Worker

## 2017-12-03 DIAGNOSIS — Z599 Problem related to housing and economic circumstances, unspecified: Secondary | ICD-10-CM

## 2017-12-03 DIAGNOSIS — Z598 Other problems related to housing and economic circumstances: Secondary | ICD-10-CM

## 2017-12-03 MED FILL — AMLODIPINE BESYLATE 10 MG T: 10 | 30 days supply | Qty: 30 | Fill #0

## 2017-12-03 MED FILL — GABAPENTIN 300 MG CAPSULE: 300 | 30 days supply | Qty: 60 | Fill #0

## 2017-12-03 MED FILL — LISINOPRIL-HCTZ 20-12.5 MG: 20-12.5 | 30 days supply | Qty: 60 | Fill #0

## 2017-12-03 MED FILL — ALLOPURINOL 300 MG TAB: 300 | 30 days supply | Qty: 30 | Fill #0

## 2017-12-03 MED FILL — ISOSORBIDE MN ER 60 MG TAB: 60 | 30 days supply | Qty: 30 | Fill #0

## 2017-12-03 NOTE — BH Specialist Note (Signed)
[facility]al [facility] Initial Visit  MRN: [id number] Name: Craig West  Number of [facility] Clinician visits:: Initial Visit Session Start time: 2:00pm  Session End time: 2:30pm Total time: 30 minutes  Type of Service: [place] Interpretor:No. Interpretor Name and Language:   SUBJECTIVE: Craig West is a 52 y.o. male accompanied by Self Patient was referred by Pharmacist for Employment Assistance. Patient reports the following symptoms/concerns: worry about being able to pay weekly rent payments Duration of problem: 4 months; Severity of problem: moderate  OBJECTIVE: Mood: Anxious and Irritable and Affect: Appropriate Risk of harm to self or others: No plan to harm self or others  LIFE CONTEXT: Family and Social: Pt stated that he does not have family support locally due to his family residing in [place]. Pt stated that he has individuals locally that will call him at times to assist with yard cutting. School/Work: Unemployed and limited financial support Self-Care: Lack of proper sleep. Pt mentioned that he has sleep apnea which disrupts his sleep. Pt mentioned that he has not been exercising due to gout.  Life Changes: Pt obtained a felony drug related charge seven years ago. Pt stated that as a result of his felony, it has been difficult for him to find a reliable job and steady income. 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   GOALS ADDRESSED: Patient will: 1. Reduce symptoms of: stress 2. Increase knowledge and/or ability of: coping skills and utilize provided resource to assist with job search   INTERVENTIONS: Interventions utilized: Link to [employer] ([employer]), provided resource of job list, and resources for food pantries in [place] Standardized Assessments completed: Not Needed  ASSESSMENT: Patient currently experiencing  stress due to lack of steady income. Pt is also experiencing lack of sleep. Pt stated that sometimes he did not have enough food. Pt mentioned that his may concern was locating a place of employment that would hire individuals who convicted a felony.    Patient may benefit from job training courses, resume building courses, and career fairs that focus on assisting individuals who have convicted a felony.   PLAN: 1. Behavioral recommendations: Deep breathing exercises 2. Referral(s): [employer] job training course 3. "From scale of 1-10, how likely are you to follow plan?": [place], MSW Intern 12/04/2017, 3:46pm

## 2017-12-09 ENCOUNTER — Ambulatory Visit: Payer: Self-pay | Attending: Family Medicine | Admitting: Family Medicine

## 2017-12-09 ENCOUNTER — Encounter: Payer: Self-pay | Admitting: Family Medicine

## 2017-12-09 VITALS — BP 182/120 | HR 83 | Temp 97.6°F | Ht 73.0 in | Wt 258.2 lb

## 2017-12-09 DIAGNOSIS — R399 Unspecified symptoms and signs involving the genitourinary system: Secondary | ICD-10-CM

## 2017-12-09 DIAGNOSIS — E1140 Type 2 diabetes mellitus with diabetic neuropathy, unspecified: Secondary | ICD-10-CM | POA: Insufficient documentation

## 2017-12-09 DIAGNOSIS — Z79899 Other long term (current) drug therapy: Secondary | ICD-10-CM | POA: Insufficient documentation

## 2017-12-09 DIAGNOSIS — Z6834 Body mass index (BMI) 34.0-34.9, adult: Secondary | ICD-10-CM | POA: Insufficient documentation

## 2017-12-09 DIAGNOSIS — E119 Type 2 diabetes mellitus without complications: Secondary | ICD-10-CM

## 2017-12-09 DIAGNOSIS — Z9114 Patient's other noncompliance with medication regimen: Secondary | ICD-10-CM | POA: Insufficient documentation

## 2017-12-09 DIAGNOSIS — I10 Essential (primary) hypertension: Secondary | ICD-10-CM

## 2017-12-09 DIAGNOSIS — E669 Obesity, unspecified: Secondary | ICD-10-CM | POA: Insufficient documentation

## 2017-12-09 DIAGNOSIS — I129 Hypertensive chronic kidney disease with stage 1 through stage 4 chronic kidney disease, or unspecified chronic kidney disease: Secondary | ICD-10-CM | POA: Insufficient documentation

## 2017-12-09 DIAGNOSIS — N183 Chronic kidney disease, stage 3 unspecified: Secondary | ICD-10-CM

## 2017-12-09 DIAGNOSIS — M1A00X0 Idiopathic chronic gout, unspecified site, without tophus (tophi): Secondary | ICD-10-CM | POA: Insufficient documentation

## 2017-12-09 DIAGNOSIS — J45909 Unspecified asthma, uncomplicated: Secondary | ICD-10-CM | POA: Insufficient documentation

## 2017-12-09 DIAGNOSIS — E1122 Type 2 diabetes mellitus with diabetic chronic kidney disease: Secondary | ICD-10-CM | POA: Insufficient documentation

## 2017-12-09 DIAGNOSIS — E7800 Pure hypercholesterolemia, unspecified: Secondary | ICD-10-CM | POA: Insufficient documentation

## 2017-12-09 DIAGNOSIS — E1149 Type 2 diabetes mellitus with other diabetic neurological complication: Secondary | ICD-10-CM

## 2017-12-09 DIAGNOSIS — Z23 Encounter for immunization: Secondary | ICD-10-CM | POA: Insufficient documentation

## 2017-12-09 LAB — POCT GLYCOSYLATED HEMOGLOBIN (HGB A1C): Hemoglobin A1C: 7.3 % — AB (ref 4.0–5.6)

## 2017-12-09 LAB — GLUCOSE, POCT (MANUAL RESULT ENTRY): POC GLUCOSE: 215 mg/dL — AB (ref 70–99)

## 2017-12-09 MED ORDER — GLIPIZIDE 5 MG PO TABS
2.5000 mg | ORAL_TABLET | Freq: Two times a day (BID) | ORAL | 6 refills | Status: DC
Start: 2017-12-09 — End: 2018-06-15

## 2017-12-09 MED ORDER — ATORVASTATIN CALCIUM 20 MG PO TABS: 20.0000 mg | ORAL_TABLET | Freq: Every day | ORAL | 6 refills | Status: DC

## 2017-12-09 MED ORDER — GABAPENTIN 300 MG PO CAPS: 300.0000 mg | ORAL_CAPSULE | Freq: Two times a day (BID) | ORAL | 6 refills | Status: DC

## 2017-12-09 MED ORDER — AMLODIPINE BESYLATE 10 MG PO TABS: 10.0000 mg | ORAL_TABLET | Freq: Every day | ORAL | 6 refills | Status: DC

## 2017-12-09 MED ORDER — TAMSULOSIN HCL 0.4 MG PO CAPS: 0.4000 mg | ORAL_CAPSULE | Freq: Every day | ORAL | 6 refills | Status: DC

## 2017-12-09 MED ORDER — ISOSORBIDE MONONITRATE ER 60 MG PO TB24: 60.0000 mg | ORAL_TABLET | Freq: Every day | ORAL | 6 refills | Status: DC

## 2017-12-09 MED ORDER — LISINOPRIL-HYDROCHLOROTHIAZIDE 20-12.5 MG PO TABS: 2.0000 | ORAL_TABLET | Freq: Every day | ORAL | 6 refills | Status: DC

## 2017-12-09 MED ORDER — ALLOPURINOL 300 MG PO TABS: 300.0000 mg | ORAL_TABLET | Freq: Every day | ORAL | 6 refills | Status: DC

## 2017-12-09 NOTE — Progress Notes (Signed)
Subjective:  Patient ID: Craig West, male    DOB: May 21, 1965  Age: 52 y.o. MRN: [id number]  CC: Diabetes   HPI [name]  is a 52 year old male with a history of hypertension, type 2 diabetes mellitus (A1c 7.3), asthma, gout, non compliance here for follow-up visit. His A1c is 7.3 which is up from 6.5 previously and he has been on dietary control of his diabetes however he endorses drinking lots of juices and sodas and does not exercise.  Of note he has gained 28 pounds in the last 6 months. He has been noncompliant with his antihypertensives as well and states he was in a hurry to get to this appointment hence did not take his medications. Denies recent gout or asthma flares. He complains of nocturia, waking up every hour at night but denies hesitancy, sense of incomplete voiding but does have urgency.  I had commenced Flomax at his last office visit which he never picked up from the pharmacy. For his chronic kidney disease, he currently does not see a nephrologist.  Past Medical History:  Diagnosis Date  . Asthma   . Diabetes mellitus without complication ([place])   . Gout   . Hypertension     History reviewed. No pertinent surgical history.  No Known Allergies    Outpatient Medications Prior to Visit  Medication Sig Dispense Refill  . albuterol (PROVENTIL HFA;VENTOLIN HFA) 108 (90 Base) MCG/ACT inhaler Inhale 2 puffs into the lungs every 6 (six) hours as needed for wheezing or shortness of breath. 1 Inhaler 2  . Blood Glucose Monitoring Suppl (TRUE METRIX METER) w/Device KIT Check blood sugar fasting and ant bedtime and record 1 kit 0  . colchicine 0.6 MG tablet Take 2 tablets (1.2 mg) orally at the onset of a gout attack; may repeat 1 tablet (0.6 mg) in 2 hours if symptoms persist 60 tablet 2  . diclofenac sodium (VOLTAREN) 1 % GEL Apply 4 g topically 4 (four) times daily. 100 g 1  . glucose blood (TRUE METRIX BLOOD GLUCOSE TEST) test strip Use as instructed 100 each 12  .  HYDROcodone-acetaminophen (NORCO/VICODIN) 5-325 MG tablet Take 1 tablet by mouth every 4 (four) hours as needed. (Patient not taking: Reported on 06/11/2017) 6 tablet 0  . TRUEPLUS LANCETS 28G MISC Check blood sugar fasting and at bedtime 100 each 2  . allopurinol (ZYLOPRIM) 300 MG tablet Take 1 tablet (300 mg total) by mouth daily. For prevention of gout 30 tablet 6  . amLODipine (NORVASC) 10 MG tablet Take 1 tablet (10 mg total) by mouth daily. For hypertension 30 tablet 6  . atorvastatin (LIPITOR) 20 MG tablet Take 1 tablet (20 mg total) by mouth daily. For hypercholesterolemia 30 tablet 6  . gabapentin (NEURONTIN) 300 MG capsule Take 1 capsule (300 mg total) by mouth 2 (two) times daily. For diabetic neuropathy 60 capsule 6  . isosorbide mononitrate (IMDUR) 60 MG 24 hr tablet Take 1 tablet (60 mg total) by mouth daily. 30 tablet 3  . lisinopril-hydrochlorothiazide (ZESTORETIC) 20-12.5 MG tablet Take 2 tablets by mouth daily. 60 tablet 6  . tamsulosin (FLOMAX) 0.4 MG CAPS capsule Take 1 capsule (0.4 mg total) by mouth daily. 30 capsule 3   No facility-administered medications prior to visit.     ROS Review of Systems  Constitutional: Negative for activity change and appetite change.  HENT: Negative for sinus pressure and sore throat.   Eyes: Negative for visual disturbance.  Respiratory: Negative for cough, chest tightness and  shortness of breath.   Cardiovascular: Negative for chest pain and leg swelling.  Gastrointestinal: Negative for abdominal distention, abdominal pain, constipation and diarrhea.  Endocrine: Negative.   Genitourinary:       See hpi  Musculoskeletal: Negative for joint swelling and myalgias.  Skin: Negative for rash.  Allergic/Immunologic: Negative.   Neurological: Negative for weakness, light-headedness and numbness.  Psychiatric/Behavioral: Negative for dysphoric mood and suicidal ideas.    Objective:  BP (!) 182/120   Pulse 83   Temp 97.6 F (36.4 C)  (Oral)   Ht _0  (1.854 m)   Wt 258 lb 3.2 oz (117.1 kg)   SpO2 98%   BMI 34.07 kg/m   BP/Weight 12/09/2017 11/03/2017 4/58/5929  Systolic BP 244 628 638  Diastolic BP 177 116 579  Wt. (Lbs) 258.2 240 241  BMI 34.07 31.66 31.8      Physical Exam  Constitutional: He is oriented to person, place, and time. He appears well-developed and well-nourished.  Cardiovascular: Normal rate, normal heart sounds and intact distal pulses.  No murmur heard. Pulmonary/Chest: Effort normal and breath sounds normal. He has no wheezes. He has no rales. He exhibits no tenderness.  Abdominal: Soft. Bowel sounds are normal. He exhibits no distension and no mass. There is no tenderness.  Musculoskeletal: Normal range of motion.  Neurological: He is alert and oriented to person, place, and time.  Skin: Skin is warm and dry.  Psychiatric: He has a normal mood and affect.    CMP Latest Ref Rng & Units 11/03/2017 09/08/2017 08/31/2017  Glucose 70 - 99 mg/dL 247(H) 116(H) 148(H)  BUN 6 - 20 mg/dL 22(H) 20 23(H)  Creatinine 0.61 - 1.24 mg/dL 2.30(H) 1.92(H) 2.03(H)  Sodium 135 - 145 mmol/L 140 139 136  Potassium 3.5 - 5.1 mmol/L 3.6 4.0 3.2(L)  Chloride 98 - 111 mmol/L 100 100 100(L)  CO2 22 - 32 mmol/L _1 Calcium 8.9 - 10.3 mg/dL 8.7(L) 8.5(L) 8.6(L)  Total Protein 6.5 - 8.1 g/dL 6.9 6.9 -  Total Bilirubin 0.3 - 1.2 mg/dL 0.8 0.3 -  Alkaline Phos 38 - 126 U/L 113 130(H) -  AST 15 - 41 U/L 44(H) 21 -  ALT 0 - 44 U/L 45(H) 26 -    Lipid Panel     Component Value Date/Time   CHOL 138 09/08/2017 0945   TRIG 184 (H) 09/08/2017 0945   HDL 43 09/08/2017 0945   CHOLHDL 3.2 09/08/2017 0945   CHOLHDL 5.2 (H) 10/23/2015 1009   VLDL 46 (H) 10/23/2015 1009   LDLCALC 58 09/08/2017 0945    Lab Results  Component Value Date   HGBA1C 7.3 (A) 12/09/2017     Assessment & Plan:   1. Type 2 diabetes mellitus without complication, without long-term current use of insulin (HCC) A1c of 7.3 which has  trended up from 6.5 previously Commenced glipizide Discussed lifestyle modifications Counseled on Diabetic diet, my plate method, 038 minutes of moderate intensity exercise/week Keep blood sugar logs with fasting goals of 80-120 mg/dl, random of less than 180 and in the event of sugars less than 60 mg/dl or greater than 400 mg/dl please notify the clinic ASAP. It is recommended that you undergo annual eye exams and annual foot exams. Pneumonia vaccine is recommended. - POCT glucose (manual entry) - POCT glycosylated hemoglobin (Hb A1C) - glipiZIDE (GLUCOTROL) 5 MG tablet; Take 0.5 tablets (2.5 mg total) by mouth 2 (two) times daily before a meal.  Dispense: 60 tablet; Refill:  6 - atorvastatin (LIPITOR) 20 MG tablet; Take 1 tablet (20 mg total) by mouth daily. For hypercholesterolemia  Dispense: 30 tablet; Refill: 6  2. Essential hypertension Uncontrolled due to noncompliance Emphasized the need to be compliant some complications of noncompliance discussed - lisinopril-hydrochlorothiazide (ZESTORETIC) 20-12.5 MG tablet; Take 2 tablets by mouth daily.  Dispense: 60 tablet; Refill: 6 - isosorbide mononitrate (IMDUR) 60 MG 24 hr tablet; Take 1 tablet (60 mg total) by mouth daily.  Dispense: 30 tablet; Refill: 6 - amLODipine (NORVASC) 10 MG tablet; Take 1 tablet (10 mg total) by mouth daily. For hypertension  Dispense: 30 tablet; Refill: 6  3. Other diabetic neurological complication associated with type 2 diabetes mellitus (HCC) Stable - gabapentin (NEURONTIN) 300 MG capsule; Take 1 capsule (300 mg total) by mouth 2 (two) times daily. For diabetic neuropathy  Dispense: 60 capsule; Refill: 6  4. Pure hypercholesterolemia Noncompliant with Lipitor Low-cholesterol diet Emphasized the need to be compliant  5. Stage 3 chronic kidney disease (HCC) Combination of hypertensive and diabetic nephropathy coupled with noncompliance with medication regimen - Ambulatory referral to Nephrology  6. Lower  urinary tract symptoms Uncontrolled due to noncompliance with Flomax - tamsulosin (FLOMAX) 0.4 MG CAPS capsule; Take 1 capsule (0.4 mg total) by mouth daily.  Dispense: 30 capsule; Refill: 6  7. Need for immunization against influenza - Flu Vaccine QUAD 36+ mos IM  8. Idiopathic chronic gout without tophus, unspecified site No recent flares - allopurinol (ZYLOPRIM) 300 MG tablet; Take 1 tablet (300 mg total) by mouth daily. For prevention of gout  Dispense: 30 tablet; Refill: 6  9. Obesity (BMI 30.0-34.9) Advised to work on incorporating an exercise regimen   Meds ordered this encounter  Medications  . glipiZIDE (GLUCOTROL) 5 MG tablet    Sig: Take 0.5 tablets (2.5 mg total) by mouth 2 (two) times daily before a meal.    Dispense:  60 tablet    Refill:  6  . tamsulosin (FLOMAX) 0.4 MG CAPS capsule    Sig: Take 1 capsule (0.4 mg total) by mouth daily.    Dispense:  30 capsule    Refill:  6  . lisinopril-hydrochlorothiazide (ZESTORETIC) 20-12.5 MG tablet    Sig: Take 2 tablets by mouth daily.    Dispense:  60 tablet    Refill:  6  . isosorbide mononitrate (IMDUR) 60 MG 24 hr tablet    Sig: Take 1 tablet (60 mg total) by mouth daily.    Dispense:  30 tablet    Refill:  6  . gabapentin (NEURONTIN) 300 MG capsule    Sig: Take 1 capsule (300 mg total) by mouth 2 (two) times daily. For diabetic neuropathy    Dispense:  60 capsule    Refill:  6  . atorvastatin (LIPITOR) 20 MG tablet    Sig: Take 1 tablet (20 mg total) by mouth daily. For hypercholesterolemia    Dispense:  30 tablet    Refill:  6  . amLODipine (NORVASC) 10 MG tablet    Sig: Take 1 tablet (10 mg total) by mouth daily. For hypertension    Dispense:  30 tablet    Refill:  6  . allopurinol (ZYLOPRIM) 300 MG tablet    Sig: Take 1 tablet (300 mg total) by mouth daily. For prevention of gout    Dispense:  30 tablet    Refill:  6    Follow-up: Return in about 3 months (around 03/10/2018) for Follow-up of chronic  medical conditions.  Charlott Rakes MD

## 2018-03-10 ENCOUNTER — Ambulatory Visit: Payer: Self-pay | Attending: Family Medicine | Admitting: Family Medicine

## 2018-03-10 ENCOUNTER — Encounter: Payer: Self-pay | Admitting: Family Medicine

## 2018-03-10 VITALS — BP 160/118 | HR 82 | Temp 98.6°F | Ht 73.0 in | Wt 248.4 lb

## 2018-03-10 DIAGNOSIS — E119 Type 2 diabetes mellitus without complications: Secondary | ICD-10-CM

## 2018-03-10 DIAGNOSIS — Z79899 Other long term (current) drug therapy: Secondary | ICD-10-CM | POA: Insufficient documentation

## 2018-03-10 DIAGNOSIS — N183 Chronic kidney disease, stage 3 unspecified: Secondary | ICD-10-CM

## 2018-03-10 DIAGNOSIS — E7800 Pure hypercholesterolemia, unspecified: Secondary | ICD-10-CM

## 2018-03-10 DIAGNOSIS — I10 Essential (primary) hypertension: Secondary | ICD-10-CM

## 2018-03-10 DIAGNOSIS — Z9114 Patient's other noncompliance with medication regimen: Secondary | ICD-10-CM

## 2018-03-10 DIAGNOSIS — J45909 Unspecified asthma, uncomplicated: Secondary | ICD-10-CM | POA: Insufficient documentation

## 2018-03-10 DIAGNOSIS — M1A00X0 Idiopathic chronic gout, unspecified site, without tophus (tophi): Secondary | ICD-10-CM

## 2018-03-10 DIAGNOSIS — E1165 Type 2 diabetes mellitus with hyperglycemia: Secondary | ICD-10-CM

## 2018-03-10 DIAGNOSIS — I129 Hypertensive chronic kidney disease with stage 1 through stage 4 chronic kidney disease, or unspecified chronic kidney disease: Secondary | ICD-10-CM | POA: Insufficient documentation

## 2018-03-10 DIAGNOSIS — E1122 Type 2 diabetes mellitus with diabetic chronic kidney disease: Secondary | ICD-10-CM | POA: Insufficient documentation

## 2018-03-10 LAB — POCT GLYCOSYLATED HEMOGLOBIN (HGB A1C): HbA1c, POC (controlled diabetic range): 7.2 % — AB (ref 0.0–7.0)

## 2018-03-10 LAB — GLUCOSE, POCT (MANUAL RESULT ENTRY): POC Glucose: 194 mg/dl — AB (ref 70–99)

## 2018-03-10 MED ORDER — CLONIDINE HCL 0.2 MG PO TABS
0.2000 mg | ORAL_TABLET | Freq: Once | ORAL | Status: AC
  Administered 2018-03-10: 0.2 mg via ORAL

## 2018-03-10 MED ORDER — PREDNISONE 20 MG PO TABS: 20.0000 mg | ORAL_TABLET | Freq: Two times a day (BID) | ORAL | 0 refills | Status: DC

## 2018-03-10 MED FILL — ISOSORBIDE MN ER 60 MG TAB: 60 | 30 days supply | Qty: 30 | Fill #0

## 2018-03-10 MED FILL — LISINOPRIL-HCTZ 20-12.5 MG: 20-12.5 | 30 days supply | Qty: 60 | Fill #0

## 2018-03-10 MED FILL — AMLODIPINE BESYLATE 10 MG T: 10 | 30 days supply | Qty: 30 | Fill #0

## 2018-03-10 NOTE — Progress Notes (Signed)
Subjective:  Patient ID: Craig West, male    DOB: 11/16/1965  Age: 52 y.o. MRN: [id number]  CC: Diabetes   HPI Craig West  is a 52 year old male with a history of hypertension, type 2 diabetes mellitus (A1c 7.2), asthma, gout, non compliance here for follow-up He has not been compliant with his medications hence his elevated blood pressure of 200/120 and clonidine 0.2 mg administered. Financial constraints have been the major limiting factor and he has not been compliant with his diabetic medications, asthma medications or gout medications. Declines adherence with a low-sodium, diabetic diet and does not exercise. Currently experiencing a gout flare in his left big toe and has not used any medications for this. Denies chest pains, shortness of breath, pedal edema.  Past Medical History:  Diagnosis Date  . Asthma   . Diabetes mellitus without complication ([place])   . Gout   . Hypertension     No past surgical history on file.   Outpatient Medications Prior to Visit  Medication Sig Dispense Refill  . albuterol (PROVENTIL HFA;VENTOLIN HFA) 108 (90 Base) MCG/ACT inhaler Inhale 2 puffs into the lungs every 6 (six) hours as needed for wheezing or shortness of breath. 1 Inhaler 2  . allopurinol (ZYLOPRIM) 300 MG tablet Take 1 tablet (300 mg total) by mouth daily. For prevention of gout 30 tablet 6  . amLODipine (NORVASC) 10 MG tablet Take 1 tablet (10 mg total) by mouth daily. For hypertension 30 tablet 6  . atorvastatin (LIPITOR) 20 MG tablet Take 1 tablet (20 mg total) by mouth daily. For hypercholesterolemia 30 tablet 6  . Blood Glucose Monitoring Suppl (TRUE METRIX METER) w/Device KIT Check blood sugar fasting and ant bedtime and record 1 kit 0  . colchicine 0.6 MG tablet Take 2 tablets (1.2 mg) orally at the onset of a gout attack; may repeat 1 tablet (0.6 mg) in 2 hours if symptoms persist 60 tablet 2  . diclofenac sodium (VOLTAREN) 1 % GEL Apply 4 g topically 4 (four) times  daily. 100 g 1  . gabapentin (NEURONTIN) 300 MG capsule Take 1 capsule (300 mg total) by mouth 2 (two) times daily. For diabetic neuropathy 60 capsule 6  . glipiZIDE (GLUCOTROL) 5 MG tablet Take 0.5 tablets (2.5 mg total) by mouth 2 (two) times daily before a meal. 60 tablet 6  . glucose blood (TRUE METRIX BLOOD GLUCOSE TEST) test strip Use as instructed 100 each 12  . HYDROcodone-acetaminophen (NORCO/VICODIN) 5-325 MG tablet Take 1 tablet by mouth every 4 (four) hours as needed. (Patient not taking: Reported on 06/11/2017) 6 tablet 0  . isosorbide mononitrate (IMDUR) 60 MG 24 hr tablet Take 1 tablet (60 mg total) by mouth daily. 30 tablet 6  . lisinopril-hydrochlorothiazide (ZESTORETIC) 20-12.5 MG tablet Take 2 tablets by mouth daily. 60 tablet 6  . tamsulosin (FLOMAX) 0.4 MG CAPS capsule Take 1 capsule (0.4 mg total) by mouth daily. 30 capsule 6  . TRUEPLUS LANCETS 28G MISC Check blood sugar fasting and at bedtime 100 each 2   No facility-administered medications prior to visit.     ROS Review of Systems  Constitutional: Negative for activity change and appetite change.  HENT: Negative for sinus pressure and sore throat.   Eyes: Negative for visual disturbance.  Respiratory: Negative for cough, chest tightness and shortness of breath.   Cardiovascular: Negative for chest pain and leg swelling.  Gastrointestinal: Negative for abdominal distention, abdominal pain, constipation and diarrhea.  Endocrine: Negative.   Genitourinary: Negative  for dysuria.  Musculoskeletal: Negative for joint swelling and myalgias.  Skin: Negative for rash.  Allergic/Immunologic: Negative.   Neurological: Negative for weakness, light-headedness and numbness.  Psychiatric/Behavioral: Negative for dysphoric mood and suicidal ideas.    Objective:  BP (!) 200/120   Pulse 82   Temp 98.6 F (37 C) (Oral)   Ht 6' 1"  (1.854 m)   Wt 248 lb 6.4 oz (112.7 kg)   SpO2 96%   BMI 32.77 kg/m   BP/Weight 03/10/2018  3/82/5053 12/06/6732  Systolic BP 193 790 240  Diastolic BP 973 532 992  Wt. (Lbs) 248.4 258.2 240  BMI 32.77 34.07 31.66     Physical Exam  Constitutional: He is oriented to person, place, and time. He appears well-developed and well-nourished.  Cardiovascular: Normal rate, normal heart sounds and intact distal pulses.  No murmur heard. Pulmonary/Chest: Effort normal and breath sounds normal. He has no wheezes. He has no rales. He exhibits no tenderness.  Abdominal: Soft. Bowel sounds are normal. He exhibits no distension and no mass. There is no tenderness.  Musculoskeletal: Normal range of motion.  Left big toe edema on medial aspect with associated tenderness.  No erythema   Neurological: He is alert and oriented to person, place, and time.  Skin: Skin is warm and dry.    CMP Latest Ref Rng & Units 11/03/2017 09/08/2017 08/31/2017  Glucose 70 - 99 mg/dL 247(H) 116(H) 148(H)  BUN 6 - 20 mg/dL 22(H) 20 23(H)  Creatinine 0.61 - 1.24 mg/dL 2.30(H) 1.92(H) 2.03(H)  Sodium 135 - 145 mmol/L 140 139 136  Potassium 3.5 - 5.1 mmol/L 3.6 4.0 3.2(L)  Chloride 98 - 111 mmol/L 100 100 100(L)  CO2 22 - 32 mmol/L 29 24 25   Calcium 8.9 - 10.3 mg/dL 8.7(L) 8.5(L) 8.6(L)  Total Protein 6.5 - 8.1 g/dL 6.9 6.9 -  Total Bilirubin 0.3 - 1.2 mg/dL 0.8 0.3 -  Alkaline Phos 38 - 126 U/L 113 130(H) -  AST 15 - 41 U/L 44(H) 21 -  ALT 0 - 44 U/L 45(H) 26 -    Lipid Panel     Component Value Date/Time   CHOL 138 09/08/2017 0945   TRIG 184 (H) 09/08/2017 0945   HDL 43 09/08/2017 0945   CHOLHDL 3.2 09/08/2017 0945   CHOLHDL 5.2 (H) 10/23/2015 1009   VLDL 46 (H) 10/23/2015 1009   LDLCALC 58 09/08/2017 0945    Lab Results  Component Value Date   HGBA1C 7.2 (A) 03/10/2018     Assessment & Plan:   1. Type 2 diabetes mellitus without complication, without long-term current use of insulin (HCC) Controlled with A1c of 7.2 - POCT glucose (manual entry) - POCT glycosylated hemoglobin (Hb A1C)  2.  Pure hypercholesterolemia Repeated triglyceride Noncompliance with statin Compliance has been emphasized Advised to apply for the [facility] financial discount  3. Idiopathic chronic gout without tophus, unspecified site Acute flare due to running out of medications Placed on prednisone  4. Non compliance w medication regimen Secondary to financial limitations  5. Accelerated hypertension Clonidine 0.2 mg administered in blood pressure repeated after 30 minutes Poor control due to noncompliance as a result of financial constraints We will speak with the pharmacy to assist with his medications - Comprehensive metabolic panel  6. Stage 3 chronic kidney disease (HCC) Combination of hypertensive and diabetic nephropathy Avoid nephrotoxins   Meds ordered this encounter  Medications  . predniSONE (DELTASONE) 20 MG tablet    Sig: Take 1 tablet (20  mg total) by mouth 2 (two) times daily with a meal.    Dispense:  10 tablet    Refill:  0    Follow-up: Return in about 3 months (around 06/09/2018) for Follow-up of chronic medical conditions.   Charlott Rakes MD

## 2018-03-11 LAB — COMPREHENSIVE METABOLIC PANEL
A/G RATIO: 1.4 (ref 1.2–2.2)
ALT: 34 IU/L (ref 0–44)
AST: 35 IU/L (ref 0–40)
Albumin: 4 g/dL (ref 3.5–5.5)
Alkaline Phosphatase: 108 IU/L (ref 39–117)
BUN/Creatinine Ratio: 13 (ref 9–20)
BUN: 25 mg/dL — AB (ref 6–24)
Bilirubin Total: 1 mg/dL (ref 0.0–1.2)
CHLORIDE: 93 mmol/L — AB (ref 96–106)
CO2: 27 mmol/L (ref 20–29)
CREATININE: 1.91 mg/dL — AB (ref 0.76–1.27)
Calcium: 9 mg/dL (ref 8.7–10.2)
GFR, EST AFRICAN AMERICAN: 45 mL/min/{1.73_m2} — AB (ref >59)
GFR, EST NON AFRICAN AMERICAN: 39 mL/min/{1.73_m2} — AB (ref >59)
Globulin, Total: 2.8 g/dL (ref 1.5–4.5)
Glucose: 159 mg/dL — ABNORMAL HIGH (ref 65–99)
Potassium: 3.8 mmol/L (ref 3.5–5.2)
Sodium: 137 mmol/L (ref 134–144)
Total Protein: 6.8 g/dL (ref 6.0–8.5)

## 2018-03-13 ENCOUNTER — Telehealth: Payer: Self-pay

## 2018-03-13 NOTE — Telephone Encounter (Signed)
Patient was called and informed to contact office for lab results. 

## 2018-03-13 NOTE — Telephone Encounter (Signed)
-----   Message from Charlott Rakes, MD sent at 03/11/2018  1:45 PM EST ----- Labs reveal abnormal kidney function which is stable compared to previous labs.  Compliance with medications is imperative to prevent deterioration of kidney function.

## 2018-03-16 NOTE — Telephone Encounter (Signed)
Patient was called and informed of lab results. 

## 2018-05-19 ENCOUNTER — Encounter (HOSPITAL_COMMUNITY): Payer: Self-pay

## 2018-05-19 ENCOUNTER — Emergency Department (HOSPITAL_COMMUNITY): Payer: Self-pay

## 2018-05-19 ENCOUNTER — Emergency Department (HOSPITAL_COMMUNITY)
Admission: EM | Admit: 2018-05-19 | Discharge: 2018-05-19 | Disposition: A | Discharge status: Home / Self Care | Payer: Self-pay | Attending: Emergency Medicine | Admitting: Emergency Medicine

## 2018-05-19 DIAGNOSIS — Z209 Contact with and (suspected) exposure to unspecified communicable disease: Secondary | ICD-10-CM | POA: Insufficient documentation

## 2018-05-19 DIAGNOSIS — R030 Elevated blood-pressure reading, without diagnosis of hypertension: Secondary | ICD-10-CM | POA: Insufficient documentation

## 2018-05-19 DIAGNOSIS — I129 Hypertensive chronic kidney disease with stage 1 through stage 4 chronic kidney disease, or unspecified chronic kidney disease: Secondary | ICD-10-CM | POA: Insufficient documentation

## 2018-05-19 DIAGNOSIS — Z7984 Long term (current) use of oral hypoglycemic drugs: Secondary | ICD-10-CM | POA: Insufficient documentation

## 2018-05-19 DIAGNOSIS — J101 Influenza due to other identified influenza virus with other respiratory manifestations: Secondary | ICD-10-CM | POA: Insufficient documentation

## 2018-05-19 DIAGNOSIS — J45909 Unspecified asthma, uncomplicated: Secondary | ICD-10-CM | POA: Insufficient documentation

## 2018-05-19 DIAGNOSIS — Z79899 Other long term (current) drug therapy: Secondary | ICD-10-CM | POA: Insufficient documentation

## 2018-05-19 DIAGNOSIS — F17210 Nicotine dependence, cigarettes, uncomplicated: Secondary | ICD-10-CM | POA: Insufficient documentation

## 2018-05-19 DIAGNOSIS — E1122 Type 2 diabetes mellitus with diabetic chronic kidney disease: Secondary | ICD-10-CM | POA: Insufficient documentation

## 2018-05-19 DIAGNOSIS — N189 Chronic kidney disease, unspecified: Secondary | ICD-10-CM | POA: Insufficient documentation

## 2018-05-19 LAB — COMPREHENSIVE METABOLIC PANEL
ALK PHOS: 81 U/L (ref 38–126)
ALT: 75 U/L — ABNORMAL HIGH (ref 0–44)
ANION GAP: 11 (ref 5–15)
AST: 105 U/L — ABNORMAL HIGH (ref 15–41)
Albumin: 3.4 g/dL — ABNORMAL LOW (ref 3.5–5.0)
BUN: 24 mg/dL — ABNORMAL HIGH (ref 6–20)
CO2: 22 mmol/L (ref 22–32)
Calcium: 8.8 mg/dL — ABNORMAL LOW (ref 8.9–10.3)
Chloride: 103 mmol/L (ref 98–111)
Creatinine, Ser: 2.39 mg/dL — ABNORMAL HIGH (ref 0.61–1.24)
GFR calc Af Amer: 34 mL/min — ABNORMAL LOW (ref >60)
GFR calc non Af Amer: 30 mL/min — ABNORMAL LOW (ref >60)
Glucose, Bld: 118 mg/dL — ABNORMAL HIGH (ref 70–99)
Potassium: 4.6 mmol/L (ref 3.5–5.1)
SODIUM: 136 mmol/L (ref 135–145)
Total Bilirubin: 1.3 mg/dL — ABNORMAL HIGH (ref 0.3–1.2)
Total Protein: 7 g/dL (ref 6.5–8.1)

## 2018-05-19 LAB — CBC WITH DIFFERENTIAL/PLATELET
Abs Immature Granulocytes: 0.07 10*3/uL (ref 0.00–0.07)
Basophils Absolute: 0 10*3/uL (ref 0.0–0.1)
Basophils Relative: 0 %
EOS PCT: 1 %
Eosinophils Absolute: 0.1 10*3/uL (ref 0.0–0.5)
HCT: 44.1 % (ref 39.0–52.0)
Hemoglobin: 14.1 g/dL (ref 13.0–17.0)
Immature Granulocytes: 1 %
Lymphocytes Relative: 7 %
Lymphs Abs: 0.7 10*3/uL (ref 0.7–4.0)
MCH: 28.8 pg (ref 26.0–34.0)
MCHC: 32 g/dL (ref 30.0–36.0)
MCV: 90.2 fL (ref 80.0–100.0)
MONO ABS: 1 10*3/uL (ref 0.1–1.0)
Monocytes Relative: 9 %
Neutro Abs: 8.9 10*3/uL — ABNORMAL HIGH (ref 1.7–7.7)
Neutrophils Relative %: 82 %
Platelets: 208 10*3/uL (ref 150–400)
RBC: 4.89 MIL/uL (ref 4.22–5.81)
RDW: 14.1 % (ref 11.5–15.5)
WBC: 10.8 10*3/uL — ABNORMAL HIGH (ref 4.0–10.5)
nRBC: 0 % (ref 0.0–0.2)

## 2018-05-19 LAB — LACTIC ACID, PLASMA: LACTIC ACID, VENOUS: 1.1 mmol/L (ref 0.5–1.9)

## 2018-05-19 LAB — INFLUENZA PANEL BY PCR (TYPE A & B)
Influenza A By PCR: POSITIVE — AB
Influenza B By PCR: NEGATIVE

## 2018-05-19 MED ORDER — CLONIDINE HCL 0.2 MG PO TABS
0.2000 mg | ORAL_TABLET | Freq: Once | ORAL | Status: AC
  Administered 2018-05-19: 0.2 mg via ORAL
  Filled 2018-05-19: qty 1

## 2018-05-19 MED ORDER — OSELTAMIVIR PHOSPHATE 30 MG PO CAPS: 30.0000 mg | ORAL_CAPSULE | Freq: Two times a day (BID) | ORAL | 0 refills | Status: DC

## 2018-05-19 MED ORDER — OSELTAMIVIR PHOSPHATE 30 MG PO CAPS
30.0000 mg | ORAL_CAPSULE | Freq: Once | ORAL | Status: AC
  Administered 2018-05-19: 30 mg via ORAL
  Filled 2018-05-19: qty 1

## 2018-05-19 MED ORDER — ACETAMINOPHEN 325 MG PO TABS
650.0000 mg | ORAL_TABLET | Freq: Once | ORAL | Status: AC | PRN
  Administered 2018-05-19: 650 mg via ORAL
  Filled 2018-05-19: qty 2

## 2018-05-19 NOTE — ED Notes (Signed)
Patient transported to X-ray 

## 2018-05-19 NOTE — ED Notes (Signed)
Pt is hypertensive in triage but did not take bp meds today.

## 2018-05-19 NOTE — Discharge Instructions (Signed)
You may take Tylenol as needed for fever and headache.  Follow-up with your primary physician and take blood pressure medications as prescribed.

## 2018-05-19 NOTE — ED Provider Notes (Signed)
[facility] EMERGENCY DEPARTMENT Provider Note   CSN: [id number] Arrival date & time: 05/19/18  1437    History   Chief Complaint Chief Complaint  Patient presents with  . URI  . Fever    HPI Craig West is a 53 y.o. male.     HPI Patient presents with 2 days of generalized headache, cough, subjective fevers and chills.  States he has been exposed to coworker with similar symptoms.  He has not had his blood pressure medication for 2 days.  Denies any visual changes.  Denies neck pain or stiffness.  No vomiting or diarrhea.  No focal weakness or numbness. Past Medical History:  Diagnosis Date  . Asthma   . Diabetes mellitus without complication ([place])   . Gout   . Hypertension     Patient Active Problem List   Diagnosis Date Noted  . Obesity (BMI 30.0-34.9) 12/09/2017  . Non compliance w medication regimen 04/18/2017  . Gout 08/02/2016  . Diabetic neuropathy ([place]) 08/02/2016  . Chronic kidney disease 08/02/2016  . Hyperlipidemia 10/24/2015  . Asthma 10/16/2015  . Type 2 diabetes mellitus without complication ([place]) 96/78/9381  . Essential hypertension 06/13/2014  . Smoker 06/13/2014    History reviewed. No pertinent surgical history.      Home Medications    Prior to Admission medications   Medication Sig Start Date End Date Taking? Authorizing Provider  albuterol (PROVENTIL HFA;VENTOLIN HFA) 108 (90 Base) MCG/ACT inhaler Inhale 2 puffs into the lungs every 6 (six) hours as needed for wheezing or shortness of breath. 05/15/17   Charlott Rakes, MD  allopurinol (ZYLOPRIM) 300 MG tablet Take 1 tablet (300 mg total) by mouth daily. For prevention of gout 12/09/17   Charlott Rakes, MD  amLODipine (NORVASC) 10 MG tablet Take 1 tablet (10 mg total) by mouth daily. For hypertension 12/09/17   Charlott Rakes, MD  atorvastatin (LIPITOR) 20 MG tablet Take 1 tablet (20 mg total) by mouth daily. For hypercholesterolemia 12/09/17   Charlott Rakes, MD  Blood  Glucose Monitoring Suppl (TRUE METRIX METER) w/Device KIT Check blood sugar fasting and ant bedtime and record 05/15/17   Charlott Rakes, MD  colchicine 0.6 MG tablet Take 2 tablets (1.2 mg) orally at the onset of a gout attack; may repeat 1 tablet (0.6 mg) in 2 hours if symptoms persist 05/15/17   Charlott Rakes, MD  diclofenac sodium (VOLTAREN) 1 % GEL Apply 4 g topically 4 (four) times daily. 09/08/17   Charlott Rakes, MD  gabapentin (NEURONTIN) 300 MG capsule Take 1 capsule (300 mg total) by mouth 2 (two) times daily. For diabetic neuropathy 12/09/17   Charlott Rakes, MD  glipiZIDE (GLUCOTROL) 5 MG tablet Take 0.5 tablets (2.5 mg total) by mouth 2 (two) times daily before a meal. 12/09/17   Charlott Rakes, MD  glucose blood (TRUE METRIX BLOOD GLUCOSE TEST) test strip Use as instructed 05/15/17   Charlott Rakes, MD  HYDROcodone-acetaminophen (NORCO/VICODIN) 5-325 MG tablet Take 1 tablet by mouth every 4 (four) hours as needed. Patient not taking: Reported on 06/11/2017 05/25/17   Lorin Glass, PA-C  isosorbide mononitrate (IMDUR) 60 MG 24 hr tablet Take 1 tablet (60 mg total) by mouth daily. 12/09/17   Charlott Rakes, MD  lisinopril-hydrochlorothiazide (ZESTORETIC) 20-12.5 MG tablet Take 2 tablets by mouth daily. 12/09/17   Charlott Rakes, MD  oseltamivir (TAMIFLU) 30 MG capsule Take 1 capsule (30 mg total) by mouth 2 (two) times daily. 05/20/18   Julianne Rice, MD  predniSONE (DELTASONE) 20 MG tablet Take 1 tablet (20 mg total) by mouth 2 (two) times daily with a meal. 03/10/18   Charlott Rakes, MD  tamsulosin (FLOMAX) 0.4 MG CAPS capsule Take 1 capsule (0.4 mg total) by mouth daily. 12/09/17   Charlott Rakes, MD  TRUEPLUS LANCETS 28G MISC Check blood sugar fasting and at bedtime 05/15/17   Charlott Rakes, MD    Family History No family history on file.  Social History Social History   Tobacco Use  . Smoking status: Current Every Day Smoker    Packs/day: 0.50    Types:  Cigarettes  . Smokeless tobacco: Never Used  . Tobacco comment: Pt states he started smoking again 7 years ago  Substance Use Topics  . Alcohol use: Yes    Alcohol/week: 0.0 standard drinks    Comment: occ  . Drug use: No     Allergies   Patient has no known allergies.   Review of Systems Review of Systems  Constitutional: Positive for chills, fatigue and fever.  HENT: Negative for sore throat and trouble swallowing.   Eyes: Negative for visual disturbance.  Respiratory: Positive for cough. Negative for shortness of breath.   Cardiovascular: Negative for chest pain.  Gastrointestinal: Negative for abdominal pain, constipation, diarrhea, nausea and vomiting.  Genitourinary: Negative for dysuria, flank pain and frequency.  Musculoskeletal: Negative for back pain, myalgias and neck pain.  Skin: Negative for rash and wound.  Neurological: Positive for headaches. Negative for dizziness, weakness, light-headedness and numbness.  All other systems reviewed and are negative.    Physical Exam Updated Vital Signs BP (!) 144/104 (BP Location: Right Arm)   Pulse 94   Temp 98.9 F (37.2 C) (Oral)   Resp (!) 21   Ht _0  (1.854 m)   Wt 112 kg   SpO2 94%   BMI 32.58 kg/m   Physical Exam Vitals signs and nursing note reviewed.  Constitutional:      General: He is not in acute distress.    Appearance: Normal appearance. He is well-developed. He is not ill-appearing or toxic-appearing.  HENT:     Head: Normocephalic and atraumatic.     Nose: Nose normal.     Mouth/Throat:     Mouth: Mucous membranes are moist.     Pharynx: No oropharyngeal exudate or posterior oropharyngeal erythema.  Eyes:     Extraocular Movements: Extraocular movements intact.     Conjunctiva/sclera: Conjunctivae normal.     Pupils: Pupils are equal, round, and reactive to light.  Neck:     Musculoskeletal: Normal range of motion and neck supple. No neck rigidity or muscular tenderness.     Comments: No  meningismus Cardiovascular:     Rate and Rhythm: Normal rate and regular rhythm.     Heart sounds: No murmur. No friction rub. No gallop.   Pulmonary:     Effort: Pulmonary effort is normal. No respiratory distress.     Breath sounds: Normal breath sounds. No stridor. No wheezing, rhonchi or rales.  Chest:     Chest wall: No tenderness.  Abdominal:     General: Bowel sounds are normal.     Palpations: Abdomen is soft.     Tenderness: There is no abdominal tenderness. There is no guarding or rebound.  Musculoskeletal: Normal range of motion.        General: No swelling, tenderness, deformity or signs of injury.     Right lower leg: No edema.     Left lower  leg: No edema.  Lymphadenopathy:     Cervical: No cervical adenopathy.  Skin:    General: Skin is warm and dry.     Capillary Refill: Capillary refill takes less than 2 seconds.     Findings: No erythema or rash.  Neurological:     General: No focal deficit present.     Mental Status: He is alert and oriented to person, place, and time.     Comments: Moving all extremities without focal deficit.  Sensation intact.  Psychiatric:        Mood and Affect: Mood normal.        Behavior: Behavior normal.      ED Treatments / Results  Labs (all labs ordered are listed, but only abnormal results are displayed) Labs Reviewed  CBC WITH DIFFERENTIAL/PLATELET - Abnormal; Notable for the following components:      Result Value   WBC 10.8 (*)    Neutro Abs 8.9 (*)    All other components within normal limits  COMPREHENSIVE METABOLIC PANEL - Abnormal; Notable for the following components:   Glucose, Bld 118 (*)    BUN 24 (*)    Creatinine, Ser 2.39 (*)    Calcium 8.8 (*)    Albumin 3.4 (*)    AST 105 (*)    ALT 75 (*)    Total Bilirubin 1.3 (*)    GFR calc non Af Amer 30 (*)    GFR calc Af Amer 34 (*)    All other components within normal limits  INFLUENZA PANEL BY PCR (TYPE A & B) - Abnormal; Notable for the following  components:   Influenza A By PCR POSITIVE (*)    All other components within normal limits  LACTIC ACID, PLASMA    EKG None  Radiology Dg Chest 2 View  Result Date: 05/19/2018 CLINICAL DATA:  Cough. EXAM: CHEST - 2 VIEW COMPARISON:  Radiograph of February 27, 2008. FINDINGS: The heart size and mediastinal contours are within normal limits. Both lungs are clear. No pneumothorax or pleural effusion is noted. The visualized skeletal structures are unremarkable. IMPRESSION: No active cardiopulmonary disease. Electronically Signed   By: Marijo Conception, M.D.   On: 05/19/2018 18:00    Procedures Procedures (including critical care time)  Medications Ordered in ED Medications  acetaminophen (TYLENOL) tablet 650 mg (650 mg Oral Given 05/19/18 1502)  cloNIDine (CATAPRES) tablet 0.2 mg (0.2 mg Oral Given 05/19/18 1727)  oseltamivir (TAMIFLU) capsule 30 mg (30 mg Oral Given 05/19/18 1822)     Initial Impression / Assessment and Plan / ED Course  I have reviewed the triage vital signs and the nursing notes.  Pertinent labs & imaging results that were available during my care of the patient were reviewed by me and considered in my medical decision making (see chart for details).        Chest x-ray without acute findings.  Patient has baseline renal insufficiency.  Mild elevation in liver enzymes.  Patient is influenza A positive.  This likely is the cause of his symptoms.  Will start on Tamiflu and have follow-up closely with his primary physician.  Patient blood pressure has improved significantly after giving clonidine in emergency department.  He is advised to take his blood pressure medication as prescribed.  Strict return precautions have been given.  Final Clinical Impressions(s) / ED Diagnoses   Final diagnoses:  Influenza A  Elevated blood pressure reading    ED Discharge Orders  Ordered    oseltamivir (TAMIFLU) 30 MG capsule  2 times daily     05/19/18 1827            Julianne Rice, MD 05/19/18 [phone]

## 2018-05-19 NOTE — ED Triage Notes (Signed)
Pt presents for evaluation of flu like symptoms starting yesterday. Pt has fever, dry cough, headache and body aches.

## 2018-05-19 NOTE — ED Notes (Signed)
Patient verbalizes understanding of discharge instructions. Opportunity for questioning and answers were provided. Armband removed by staff, pt discharged from ED ambulatory.   

## 2018-06-15 ENCOUNTER — Encounter: Payer: Self-pay | Admitting: Family Medicine

## 2018-06-15 ENCOUNTER — Other Ambulatory Visit: Payer: Self-pay

## 2018-06-15 ENCOUNTER — Ambulatory Visit: Payer: Self-pay | Attending: Family Medicine | Admitting: Family Medicine

## 2018-06-15 VITALS — BP 186/136 | HR 83 | Temp 98.3°F | Ht 73.0 in | Wt 259.0 lb

## 2018-06-15 DIAGNOSIS — I10 Essential (primary) hypertension: Secondary | ICD-10-CM

## 2018-06-15 DIAGNOSIS — E1149 Type 2 diabetes mellitus with other diabetic neurological complication: Secondary | ICD-10-CM

## 2018-06-15 DIAGNOSIS — M1A00X0 Idiopathic chronic gout, unspecified site, without tophus (tophi): Secondary | ICD-10-CM

## 2018-06-15 DIAGNOSIS — R399 Unspecified symptoms and signs involving the genitourinary system: Secondary | ICD-10-CM

## 2018-06-15 DIAGNOSIS — Z9114 Patient's other noncompliance with medication regimen: Secondary | ICD-10-CM

## 2018-06-15 DIAGNOSIS — E119 Type 2 diabetes mellitus without complications: Secondary | ICD-10-CM

## 2018-06-15 DIAGNOSIS — E1165 Type 2 diabetes mellitus with hyperglycemia: Secondary | ICD-10-CM

## 2018-06-15 LAB — GLUCOSE, POCT (MANUAL RESULT ENTRY): POC Glucose: 237 mg/dl — AB (ref 70–99)

## 2018-06-15 LAB — POCT GLYCOSYLATED HEMOGLOBIN (HGB A1C): HbA1c, POC (controlled diabetic range): 7.4 % — AB (ref 0.0–7.0)

## 2018-06-15 MED ORDER — ALLOPURINOL 300 MG PO TABS: 300.0000 mg | ORAL_TABLET | Freq: Every day | ORAL | 6 refills | Status: DC

## 2018-06-15 MED ORDER — GLIPIZIDE 5 MG PO TABS: 2.5000 mg | ORAL_TABLET | Freq: Two times a day (BID) | ORAL | 6 refills | Status: DC

## 2018-06-15 MED ORDER — LISINOPRIL-HYDROCHLOROTHIAZIDE 20-12.5 MG PO TABS: 2.0000 | ORAL_TABLET | Freq: Every day | ORAL | 6 refills | Status: DC

## 2018-06-15 MED ORDER — TAMSULOSIN HCL 0.4 MG PO CAPS: 0.4000 mg | ORAL_CAPSULE | Freq: Every day | ORAL | 6 refills | Status: DC

## 2018-06-15 MED ORDER — GABAPENTIN 300 MG PO CAPS: 300.0000 mg | ORAL_CAPSULE | Freq: Two times a day (BID) | ORAL | 6 refills | Status: DC

## 2018-06-15 MED ORDER — CLONIDINE HCL 0.1 MG PO TABS
0.1000 mg | ORAL_TABLET | Freq: Once | ORAL | Status: AC
  Administered 2018-06-15: 0.1 mg via ORAL

## 2018-06-15 MED ORDER — AMLODIPINE BESYLATE 10 MG PO TABS: 10.0000 mg | ORAL_TABLET | Freq: Every day | ORAL | 6 refills | Status: DC

## 2018-06-15 MED ORDER — ATORVASTATIN CALCIUM 20 MG PO TABS: 20.0000 mg | ORAL_TABLET | Freq: Every day | ORAL | 6 refills | Status: DC

## 2018-06-15 NOTE — Progress Notes (Signed)
Subjective:  Patient ID: Craig West, male    DOB: [birth year]  Age: 53 y.o. MRN: [id number]  CC: Diabetes   HPI [name]  is a 53 year old male with a history of hypertension, type 2 diabetes mellitus (A1c 7.4), asthma, gout, non compliance here for follow-up His blood pressure is significantly elevated and he endorses running out of his antihypertensives but denies chest pain, blurry vision, dyspnea. Has also been out of his diabetic medications but denies hypoglycemia, neuropathy. He remains on a statin and is also on BPH medications. He has no additional concerns at this time and denies recent gout flares.  Past Medical History:  Diagnosis Date  . Asthma   . Diabetes mellitus without complication ([place])   . Gout   . Hypertension     History reviewed. No pertinent surgical history.  History reviewed. No pertinent family history.  No Known Allergies  Outpatient Medications Prior to Visit  Medication Sig Dispense Refill  . albuterol (PROVENTIL HFA;VENTOLIN HFA) 108 (90 Base) MCG/ACT inhaler Inhale 2 puffs into the lungs every 6 (six) hours as needed for wheezing or shortness of breath. 1 Inhaler 2  . Blood Glucose Monitoring Suppl (TRUE METRIX METER) w/Device KIT Check blood sugar fasting and ant bedtime and record 1 kit 0  . colchicine 0.6 MG tablet Take 2 tablets (1.2 mg) orally at the onset of a gout attack; may repeat 1 tablet (0.6 mg) in 2 hours if symptoms persist 60 tablet 2  . glucose blood (TRUE METRIX BLOOD GLUCOSE TEST) test strip Use as instructed 100 each 12  . isosorbide mononitrate (IMDUR) 60 MG 24 hr tablet Take 1 tablet (60 mg total) by mouth daily. 30 tablet 6  . amLODipine (NORVASC) 10 MG tablet Take 1 tablet (10 mg total) by mouth daily. For hypertension 30 tablet 6  . lisinopril-hydrochlorothiazide (ZESTORETIC) 20-12.5 MG tablet Take 2 tablets by mouth daily. 60 tablet 6  . diclofenac sodium (VOLTAREN) 1 % GEL Apply 4 g topically 4 (four) times daily.  (Patient not taking: Reported on 06/15/2018) 100 g 1  . HYDROcodone-acetaminophen (NORCO/VICODIN) 5-325 MG tablet Take 1 tablet by mouth every 4 (four) hours as needed. (Patient not taking: Reported on 06/11/2017) 6 tablet 0  . oseltamivir (TAMIFLU) 30 MG capsule Take 1 capsule (30 mg total) by mouth 2 (two) times daily. (Patient not taking: Reported on 06/15/2018) 10 capsule 0  . predniSONE (DELTASONE) 20 MG tablet Take 1 tablet (20 mg total) by mouth 2 (two) times daily with a meal. (Patient not taking: Reported on 06/15/2018) 10 tablet 0  . TRUEPLUS LANCETS 28G MISC Check blood sugar fasting and at bedtime (Patient not taking: Reported on 06/15/2018) 100 each 2  . allopurinol (ZYLOPRIM) 300 MG tablet Take 1 tablet (300 mg total) by mouth daily. For prevention of gout (Patient not taking: Reported on 06/15/2018) 30 tablet 6  . atorvastatin (LIPITOR) 20 MG tablet Take 1 tablet (20 mg total) by mouth daily. For hypercholesterolemia (Patient not taking: Reported on 06/15/2018) 30 tablet 6  . gabapentin (NEURONTIN) 300 MG capsule Take 1 capsule (300 mg total) by mouth 2 (two) times daily. For diabetic neuropathy (Patient not taking: Reported on 06/15/2018) 60 capsule 6  . glipiZIDE (GLUCOTROL) 5 MG tablet Take 0.5 tablets (2.5 mg total) by mouth 2 (two) times daily before a meal. (Patient not taking: Reported on 06/15/2018) 60 tablet 6  . tamsulosin (FLOMAX) 0.4 MG CAPS capsule Take 1 capsule (0.4 mg total) by mouth daily. (Patient  not taking: Reported on 06/15/2018) 30 capsule 6   No facility-administered medications prior to visit.      ROS Review of Systems  Constitutional: Negative for activity change and appetite change.  HENT: Negative for sinus pressure and sore throat.   Eyes: Negative for visual disturbance.  Respiratory: Negative for cough, chest tightness and shortness of breath.   Cardiovascular: Negative for chest pain and leg swelling.  Gastrointestinal: Negative for abdominal distention,  abdominal pain, constipation and diarrhea.  Endocrine: Negative.   Genitourinary: Negative for dysuria.  Musculoskeletal: Negative for joint swelling and myalgias.  Skin: Negative for rash.  Allergic/Immunologic: Negative.   Neurological: Negative for weakness, light-headedness and numbness.  Psychiatric/Behavioral: Negative for dysphoric mood and suicidal ideas.    Objective:  BP (!) 186/136   Pulse 83   Temp 98.3 F (36.8 C) (Oral)   Ht 6' 1"  (1.854 m)   Wt 259 lb (117.5 kg)   SpO2 98%   BMI 34.17 kg/m   BP/Weight 06/15/2018 05/19/2018 49/44/9675  Systolic BP 916 384 665  Diastolic BP 993 570 177  Wt. (Lbs) 259 246.92 248.4  BMI 34.17 32.58 32.77      Physical Exam Constitutional:      Appearance: He is well-developed.  Cardiovascular:     Rate and Rhythm: Normal rate.     Heart sounds: Normal heart sounds. No murmur.  Pulmonary:     Effort: Pulmonary effort is normal.     Breath sounds: Normal breath sounds. No wheezing or rales.  Chest:     Chest wall: No tenderness.  Abdominal:     General: Bowel sounds are normal. There is no distension.     Palpations: Abdomen is soft. There is no mass.     Tenderness: There is no abdominal tenderness.  Musculoskeletal: Normal range of motion.  Neurological:     Mental Status: He is alert and oriented to person, place, and time.  Psychiatric:        Mood and Affect: Mood normal.     CMP Latest Ref Rng & Units 05/19/2018 03/10/2018 11/03/2017  Glucose 70 - 99 mg/dL 118(H) 159(H) 247(H)  BUN 6 - 20 mg/dL 24(H) 25(H) 22(H)  Creatinine 0.61 - 1.24 mg/dL 2.39(H) 1.91(H) 2.30(H)  Sodium 135 - 145 mmol/L 136 137 140  Potassium 3.5 - 5.1 mmol/L 4.6 3.8 3.6  Chloride 98 - 111 mmol/L 103 93(L) 100  CO2 22 - 32 mmol/L 22 27 29   Calcium 8.9 - 10.3 mg/dL 8.8(L) 9.0 8.7(L)  Total Protein 6.5 - 8.1 g/dL 7.0 6.8 6.9  Total Bilirubin 0.3 - 1.2 mg/dL 1.3(H) 1.0 0.8  Alkaline Phos 38 - 126 U/L 81 108 113  AST 15 - 41 U/L 105(H) 35  44(H)  ALT 0 - 44 U/L 75(H) 34 45(H)    Lipid Panel     Component Value Date/Time   CHOL 138 09/08/2017 0945   TRIG 184 (H) 09/08/2017 0945   HDL 43 09/08/2017 0945   CHOLHDL 3.2 09/08/2017 0945   CHOLHDL 5.2 (H) 10/23/2015 1009   VLDL 46 (H) 10/23/2015 1009   LDLCALC 58 09/08/2017 0945    CBC    Component Value Date/Time   WBC 10.8 (H) 05/19/2018 1659   RBC 4.89 05/19/2018 1659   HGB 14.1 05/19/2018 1659   HGB 14.5 07/11/2016 0949   HCT 44.1 05/19/2018 1659   HCT 43.0 07/11/2016 0949   PLT 208 05/19/2018 1659   PLT 223 07/11/2016 0949   MCV 90.2  05/19/2018 1659   MCV 89 07/11/2016 0949   MCH 28.8 05/19/2018 1659   MCHC 32.0 05/19/2018 1659   RDW 14.1 05/19/2018 1659   RDW 13.8 07/11/2016 0949   LYMPHSABS 0.7 05/19/2018 1659   LYMPHSABS 0.7 07/11/2016 0949   MONOABS 1.0 05/19/2018 1659   EOSABS 0.1 05/19/2018 1659   EOSABS 0.0 07/11/2016 0949   BASOSABS 0.0 05/19/2018 1659   BASOSABS 0.0 07/11/2016 0949    Lab Results  Component Value Date   HGBA1C 7.4 (A) 06/15/2018    Assessment & Plan:   1. Type 2 diabetes mellitus without complication, without long-term current use of insulin (HCC) A1c of 7.4 which has trended up from 7.2 previously He has been out of his medications No regimen change at this time Counseled on Diabetic diet, my plate method, 732 minutes of moderate intensity exercise/week Keep blood sugar logs with fasting goals of 80-120 mg/dl, random of less than 180 and in the event of sugars less than 60 mg/dl or greater than 400 mg/dl please notify the clinic ASAP. It is recommended that you undergo annual eye exams and annual foot exams. Pneumonia vaccine is recommended. - POCT glucose (manual entry) - POCT glycosylated hemoglobin (Hb A1C) - atorvastatin (LIPITOR) 20 MG tablet; Take 1 tablet (20 mg total) by mouth daily. For hypercholesterolemia  Dispense: 30 tablet; Refill: 6 - glipiZIDE (GLUCOTROL) 5 MG tablet; Take 0.5 tablets (2.5 mg total) by  mouth 2 (two) times daily before a meal.  Dispense: 60 tablet; Refill: 6  2. Idiopathic chronic gout without tophus, unspecified site No recent flares - allopurinol (ZYLOPRIM) 300 MG tablet; Take 1 tablet (300 mg total) by mouth daily. For prevention of gout  Dispense: 30 tablet; Refill: 6  3. Essential hypertension Uncontrolled due to running out of medications Clonidine 0.1 mg administered Compliance emphasized Counseled on blood pressure goal of less than 130/80, low-sodium, DASH diet, medication compliance, 150 minutes of moderate intensity exercise per week. Discussed medication compliance, adverse effects. - amLODipine (NORVASC) 10 MG tablet; Take 1 tablet (10 mg total) by mouth daily. For hypertension  Dispense: 30 tablet; Refill: 6 - lisinopril-hydrochlorothiazide (ZESTORETIC) 20-12.5 MG tablet; Take 2 tablets by mouth daily.  Dispense: 60 tablet; Refill: 6 - cloNIDine (CATAPRES) tablet 0.1 mg  4. Other diabetic neurological complication associated with type 2 diabetes mellitus (HCC) Stable - gabapentin (NEURONTIN) 300 MG capsule; Take 1 capsule (300 mg total) by mouth 2 (two) times daily. For diabetic neuropathy  Dispense: 60 capsule; Refill: 6  5. Lower urinary tract symptoms Stable - tamsulosin (FLOMAX) 0.4 MG CAPS capsule; Take 1 capsule (0.4 mg total) by mouth daily.  Dispense: 30 capsule; Refill: 6   Meds ordered this encounter  Medications  . allopurinol (ZYLOPRIM) 300 MG tablet    Sig: Take 1 tablet (300 mg total) by mouth daily. For prevention of gout    Dispense:  30 tablet    Refill:  6  . amLODipine (NORVASC) 10 MG tablet    Sig: Take 1 tablet (10 mg total) by mouth daily. For hypertension    Dispense:  30 tablet    Refill:  6  . atorvastatin (LIPITOR) 20 MG tablet    Sig: Take 1 tablet (20 mg total) by mouth daily. For hypercholesterolemia    Dispense:  30 tablet    Refill:  6  . gabapentin (NEURONTIN) 300 MG capsule    Sig: Take 1 capsule (300 mg total)  by mouth 2 (two) times daily. For diabetic  neuropathy    Dispense:  60 capsule    Refill:  6  . glipiZIDE (GLUCOTROL) 5 MG tablet    Sig: Take 0.5 tablets (2.5 mg total) by mouth 2 (two) times daily before a meal.    Dispense:  60 tablet    Refill:  6  . lisinopril-hydrochlorothiazide (ZESTORETIC) 20-12.5 MG tablet    Sig: Take 2 tablets by mouth daily.    Dispense:  60 tablet    Refill:  6  . tamsulosin (FLOMAX) 0.4 MG CAPS capsule    Sig: Take 1 capsule (0.4 mg total) by mouth daily.    Dispense:  30 capsule    Refill:  6  . cloNIDine (CATAPRES) tablet 0.1 mg    Follow-up: Return in about 3 months (around 09/15/2018) for follow up of chronic medical conditions.       Charlott Rakes, MD, FAAFP. [facility] [place], [place]   06/15/2018, 11:10 AM

## 2018-06-25 ENCOUNTER — Telehealth: Payer: Self-pay

## 2018-06-25 MED FILL — ALLOPURINOL 300 MG TAB: 300 | 30 days supply | Qty: 30 | Fill #0

## 2018-06-26 ENCOUNTER — Telehealth: Payer: Self-pay

## 2018-06-26 DIAGNOSIS — M1A00X0 Idiopathic chronic gout, unspecified site, without tophus (tophi): Secondary | ICD-10-CM

## 2018-06-26 MED ORDER — COLCHICINE 0.6 MG PO TABS: ORAL_TABLET | ORAL | 2 refills | Status: DC

## 2018-06-26 NOTE — Telephone Encounter (Signed)
Patient called and needs refill on Colchicine sent to pharmacy here for gout flare.

## 2018-06-26 NOTE — Telephone Encounter (Signed)
Error

## 2018-06-26 NOTE — Telephone Encounter (Signed)
Refilled

## 2018-07-16 MED FILL — !COLCRYS 0.6 MG TABLET: 0.6 MG | 15 days supply | Qty: 30 | Fill #0

## 2018-09-14 ENCOUNTER — Other Ambulatory Visit: Payer: Self-pay

## 2018-09-14 ENCOUNTER — Ambulatory Visit: Payer: Self-pay | Attending: Family Medicine | Admitting: Family Medicine

## 2018-09-14 ENCOUNTER — Encounter: Payer: Self-pay | Admitting: Family Medicine

## 2018-09-14 VITALS — BP 220/150 | HR 79 | Temp 97.3°F | Ht 73.0 in | Wt 253.8 lb

## 2018-09-14 DIAGNOSIS — F17200 Nicotine dependence, unspecified, uncomplicated: Secondary | ICD-10-CM

## 2018-09-14 DIAGNOSIS — E119 Type 2 diabetes mellitus without complications: Secondary | ICD-10-CM

## 2018-09-14 DIAGNOSIS — F17210 Nicotine dependence, cigarettes, uncomplicated: Secondary | ICD-10-CM

## 2018-09-14 DIAGNOSIS — M1A00X0 Idiopathic chronic gout, unspecified site, without tophus (tophi): Secondary | ICD-10-CM

## 2018-09-14 DIAGNOSIS — R399 Unspecified symptoms and signs involving the genitourinary system: Secondary | ICD-10-CM

## 2018-09-14 DIAGNOSIS — I10 Essential (primary) hypertension: Secondary | ICD-10-CM

## 2018-09-14 DIAGNOSIS — N183 Chronic kidney disease, stage 3 unspecified: Secondary | ICD-10-CM

## 2018-09-14 DIAGNOSIS — E1149 Type 2 diabetes mellitus with other diabetic neurological complication: Secondary | ICD-10-CM

## 2018-09-14 LAB — POCT GLYCOSYLATED HEMOGLOBIN (HGB A1C): HbA1c, POC (controlled diabetic range): 8.1 % — AB (ref 0.0–7.0)

## 2018-09-14 LAB — GLUCOSE, POCT (MANUAL RESULT ENTRY): POC Glucose: 189 mg/dl — AB (ref 70–99)

## 2018-09-14 MED ORDER — CLONIDINE HCL 0.2 MG PO TABS
0.2000 mg | ORAL_TABLET | Freq: Once | ORAL | Status: AC
  Administered 2018-09-14: 0.2 mg via ORAL

## 2018-09-14 MED ORDER — TAMSULOSIN HCL 0.4 MG PO CAPS: 0.4000 mg | ORAL_CAPSULE | Freq: Every day | ORAL | 6 refills | Status: DC

## 2018-09-14 MED ORDER — ATORVASTATIN CALCIUM 20 MG PO TABS: 20.0000 mg | ORAL_TABLET | Freq: Every day | ORAL | 6 refills | Status: DC

## 2018-09-14 MED ORDER — ISOSORBIDE MONONITRATE ER 60 MG PO TB24: 60.0000 mg | ORAL_TABLET | Freq: Every day | ORAL | 6 refills | Status: DC

## 2018-09-14 MED ORDER — LISINOPRIL-HYDROCHLOROTHIAZIDE 20-12.5 MG PO TABS: 2.0000 | ORAL_TABLET | Freq: Every day | ORAL | 6 refills | Status: DC

## 2018-09-14 MED ORDER — AMLODIPINE BESYLATE 10 MG PO TABS: 10.0000 mg | ORAL_TABLET | Freq: Every day | ORAL | 6 refills | Status: DC

## 2018-09-14 MED ORDER — GLIPIZIDE 5 MG PO TABS: 5.0000 mg | ORAL_TABLET | Freq: Two times a day (BID) | ORAL | 6 refills | Status: DC

## 2018-09-14 MED ORDER — ALLOPURINOL 300 MG PO TABS: 300.0000 mg | ORAL_TABLET | Freq: Every day | ORAL | 6 refills | Status: DC

## 2018-09-14 MED ORDER — GABAPENTIN 300 MG PO CAPS: 300.0000 mg | ORAL_CAPSULE | Freq: Two times a day (BID) | ORAL | 6 refills | Status: DC

## 2018-09-14 MED FILL — TAMSULOSIN HCL 0.4 MG CAP: 0.4 | 30 days supply | Qty: 30 | Fill #0

## 2018-09-14 MED FILL — ISOSORBIDE MN ER 60 MG TAB: 60 | 30 days supply | Qty: 30 | Fill #0

## 2018-09-14 MED FILL — LISINOPRIL-HCTZ 20-12.5 MG: 20-12.5 | 30 days supply | Qty: 60 | Fill #0

## 2018-09-14 MED FILL — ?AMLODIPINE BESYLATE 10 MG: 10 | 30 days supply | Qty: 30 | Fill #0

## 2018-09-14 MED FILL — GABAPENTIN 300 MG CAPSULE: 300 | 30 days supply | Qty: 60 | Fill #0

## 2018-09-14 MED FILL — ?ALLOPURINOL 300 MG TABLET: 300 | 30 days supply | Qty: 30 | Fill #1

## 2018-09-14 MED FILL — !COLCRYS 0.6 MG TABLET: 0.6 MG | 15 days supply | Qty: 30 | Fill #1

## 2018-09-14 MED FILL — ?GLIPIZIDE 5MG TABLET: 5 | 30 days supply | Qty: 60 | Fill #0

## 2018-09-14 MED FILL — ?ATORVASTATIN 20 MG TABLET: 20 | 30 days supply | Qty: 30 | Fill #0

## 2018-09-14 NOTE — Progress Notes (Signed)
Subjective:  Patient ID: Craig West, male    DOB: 1965-06-05  Age: 53 y.o. MRN: [id number]  CC: Diabetes   HPI Mendel Binsfeld is a 53 year old male with a history of hypertension, type 2 diabetes mellitus (A1c 8.1), asthma, gout, non compliance here for follow-up Blood pressure is severely elevated at 220/150 and he informs me he has been out of his antihypertensives for the last 2 months.  He states on coming to the pharmacy he was informed they would mail his medications but he never got it. He denies chest pain, blurry vision, shortness of breath. His A1c is 8.1 which has trended up from 7.4 previously.  Denies hypoglycemic symptoms, numbness in extremities.  He does not exercise regularly. Due for an annual eye exam however he has no medical coverage for referral to ophthalmologist.  Smokes 3 to 4 cigarettes/day and is not interested in quitting.  With regards to healthcare maintenance he is due for colonoscopy.    Past Medical History:  Diagnosis Date  . Asthma   . Diabetes mellitus without complication ([place])   . Gout   . Hypertension     History reviewed. No pertinent surgical history.  History reviewed. No pertinent family history.  No Known Allergies  Outpatient Medications Prior to Visit  Medication Sig Dispense Refill  . albuterol (PROVENTIL HFA;VENTOLIN HFA) 108 (90 Base) MCG/ACT inhaler Inhale 2 puffs into the lungs every 6 (six) hours as needed for wheezing or shortness of breath. 1 Inhaler 2  . Blood Glucose Monitoring Suppl (TRUE METRIX METER) w/Device KIT Check blood sugar fasting and ant bedtime and record 1 kit 0  . colchicine 0.6 MG tablet Take 2 tablets (1.2 mg) orally at the onset of a gout attack; may repeat 1 tablet (0.6 mg) in 2 hours if symptoms persist 60 tablet 2  . glucose blood (TRUE METRIX BLOOD GLUCOSE TEST) test strip Use as instructed 100 each 12  . allopurinol (ZYLOPRIM) 300 MG tablet Take 1 tablet (300 mg total) by mouth daily. For prevention  of gout 30 tablet 6  . amLODipine (NORVASC) 10 MG tablet Take 1 tablet (10 mg total) by mouth daily. For hypertension 30 tablet 6  . atorvastatin (LIPITOR) 20 MG tablet Take 1 tablet (20 mg total) by mouth daily. For hypercholesterolemia 30 tablet 6  . isosorbide mononitrate (IMDUR) 60 MG 24 hr tablet Take 1 tablet (60 mg total) by mouth daily. 30 tablet 6  . lisinopril-hydrochlorothiazide (ZESTORETIC) 20-12.5 MG tablet Take 2 tablets by mouth daily. 60 tablet 6  . HYDROcodone-acetaminophen (NORCO/VICODIN) 5-325 MG tablet Take 1 tablet by mouth every 4 (four) hours as needed. (Patient not taking: Reported on 06/11/2017) 6 tablet 0  . oseltamivir (TAMIFLU) 30 MG capsule Take 1 capsule (30 mg total) by mouth 2 (two) times daily. (Patient not taking: Reported on 06/15/2018) 10 capsule 0  . TRUEPLUS LANCETS 28G MISC Check blood sugar fasting and at bedtime (Patient not taking: Reported on 06/15/2018) 100 each 2  . diclofenac sodium (VOLTAREN) 1 % GEL Apply 4 g topically 4 (four) times daily. (Patient not taking: Reported on 06/15/2018) 100 g 1  . gabapentin (NEURONTIN) 300 MG capsule Take 1 capsule (300 mg total) by mouth 2 (two) times daily. For diabetic neuropathy (Patient not taking: Reported on 09/14/2018) 60 capsule 6  . glipiZIDE (GLUCOTROL) 5 MG tablet Take 0.5 tablets (2.5 mg total) by mouth 2 (two) times daily before a meal. (Patient not taking: Reported on 09/14/2018) 60 tablet 6  .  predniSONE (DELTASONE) 20 MG tablet Take 1 tablet (20 mg total) by mouth 2 (two) times daily with a meal. (Patient not taking: Reported on 06/15/2018) 10 tablet 0  . tamsulosin (FLOMAX) 0.4 MG CAPS capsule Take 1 capsule (0.4 mg total) by mouth daily. (Patient not taking: Reported on 09/14/2018) 30 capsule 6   No facility-administered medications prior to visit.      ROS Review of Systems  Constitutional: Negative for activity change and appetite change.  HENT: Negative for sinus pressure and sore throat.   Eyes:  Negative for visual disturbance.  Respiratory: Negative for cough, chest tightness and shortness of breath.   Cardiovascular: Negative for chest pain and leg swelling.  Gastrointestinal: Negative for abdominal distention, abdominal pain, constipation and diarrhea.  Endocrine: Negative.   Genitourinary: Negative for dysuria.  Musculoskeletal: Negative for joint swelling and myalgias.  Skin: Negative for rash.  Allergic/Immunologic: Negative.   Neurological: Negative for weakness, light-headedness and numbness.  Psychiatric/Behavioral: Negative for dysphoric mood and suicidal ideas.    Objective:  BP (!) 220/150   Pulse 79   Temp (!) 97.3 F (36.3 C) (Oral)   Ht 6' 1"  (1.854 m)   Wt 253 lb 12.8 oz (115.1 kg)   SpO2 97%   BMI 33.48 kg/m   BP/Weight 09/14/2018 06/15/2018 12/14/7827  Systolic BP 562 130 865  Diastolic BP 784 696 295  Wt. (Lbs) 253.8 259 246.92  BMI 33.48 34.17 32.58      Physical Exam Constitutional:      Appearance: He is well-developed.  Cardiovascular:     Rate and Rhythm: Normal rate.     Heart sounds: Normal heart sounds. No murmur.  Pulmonary:     Effort: Pulmonary effort is normal.     Breath sounds: Normal breath sounds. No wheezing or rales.  Chest:     Chest wall: No tenderness.  Abdominal:     General: Bowel sounds are normal. There is no distension.     Palpations: Abdomen is soft. There is no mass.     Tenderness: There is no abdominal tenderness.  Musculoskeletal: Normal range of motion.  Neurological:     Mental Status: He is alert and oriented to person, place, and time.  Psychiatric:        Mood and Affect: Mood normal.     CMP Latest Ref Rng & Units 05/19/2018 03/10/2018 11/03/2017  Glucose 70 - 99 mg/dL 118(H) 159(H) 247(H)  BUN 6 - 20 mg/dL 24(H) 25(H) 22(H)  Creatinine 0.61 - 1.24 mg/dL 2.39(H) 1.91(H) 2.30(H)  Sodium 135 - 145 mmol/L 136 137 140  Potassium 3.5 - 5.1 mmol/L 4.6 3.8 3.6  Chloride 98 - 111 mmol/L 103 93(L) 100   CO2 22 - 32 mmol/L 22 27 29   Calcium 8.9 - 10.3 mg/dL 8.8(L) 9.0 8.7(L)  Total Protein 6.5 - 8.1 g/dL 7.0 6.8 6.9  Total Bilirubin 0.3 - 1.2 mg/dL 1.3(H) 1.0 0.8  Alkaline Phos 38 - 126 U/L 81 108 113  AST 15 - 41 U/L 105(H) 35 44(H)  ALT 0 - 44 U/L 75(H) 34 45(H)    Lipid Panel     Component Value Date/Time   CHOL 138 09/08/2017 0945   TRIG 184 (H) 09/08/2017 0945   HDL 43 09/08/2017 0945   CHOLHDL 3.2 09/08/2017 0945   CHOLHDL 5.2 (H) 10/23/2015 1009   VLDL 46 (H) 10/23/2015 1009   LDLCALC 58 09/08/2017 0945    CBC    Component Value Date/Time   WBC  10.8 (H) 05/19/2018 1659   RBC 4.89 05/19/2018 1659   HGB 14.1 05/19/2018 1659   HGB 14.5 07/11/2016 0949   HCT 44.1 05/19/2018 1659   HCT 43.0 07/11/2016 0949   PLT 208 05/19/2018 1659   PLT 223 07/11/2016 0949   MCV 90.2 05/19/2018 1659   MCV 89 07/11/2016 0949   MCH 28.8 05/19/2018 1659   MCHC 32.0 05/19/2018 1659   RDW 14.1 05/19/2018 1659   RDW 13.8 07/11/2016 0949   LYMPHSABS 0.7 05/19/2018 1659   LYMPHSABS 0.7 07/11/2016 0949   MONOABS 1.0 05/19/2018 1659   EOSABS 0.1 05/19/2018 1659   EOSABS 0.0 07/11/2016 0949   BASOSABS 0.0 05/19/2018 1659   BASOSABS 0.0 07/11/2016 0949    Lab Results  Component Value Date   HGBA1C 8.1 (A) 09/14/2018    Assessment & Plan:   1. Type 2 diabetes mellitus without complication, without long-term current use of insulin (HCC) Uncontrolled with A1c of 8.1 which has trended up from 7.4 He has been out of his medication which I have refilled Counseled on Diabetic diet, my plate method, 580 minutes of moderate intensity exercise/week Keep blood sugar logs with fasting goals of 80-120 mg/dl, random of less than 180 and in the event of sugars less than 60 mg/dl or greater than 400 mg/dl please notify the clinic ASAP. It is recommended that you undergo annual eye exams and annual foot exams. Pneumonia vaccine is recommended. - POCT glucose (manual entry) - POCT glycosylated  hemoglobin (Hb A1C) - glipiZIDE (GLUCOTROL) 5 MG tablet; Take 1 tablet (5 mg total) by mouth 2 (two) times daily before a meal.  Dispense: 60 tablet; Refill: 6 - atorvastatin (LIPITOR) 20 MG tablet; Take 1 tablet (20 mg total) by mouth daily. For hypercholesterolemia  Dispense: 30 tablet; Refill: 6  2. Lower urinary tract symptoms Stable - tamsulosin (FLOMAX) 0.4 MG CAPS capsule; Take 1 capsule (0.4 mg total) by mouth daily.  Dispense: 30 capsule; Refill: 6  3. Accelerated hypertension Uncontrolled due to running out of medications No evidence of endorgan affectation Clonidine 0.2 mg administered and blood pressure repeated after 30 minutes Have spoken with the pharmacy who will ensure he gets his medications prior to leaving today Counseled on blood pressure goal of less than 130/80, low-sodium, DASH diet, medication compliance, 150 minutes of moderate intensity exercise per week. Discussed medication compliance, adverse effects. - amLODipine (NORVASC) 10 MG tablet; Take 1 tablet (10 mg total) by mouth daily. For hypertension  Dispense: 30 tablet; Refill: 6 - lisinopril-hydrochlorothiazide (ZESTORETIC) 20-12.5 MG tablet; Take 2 tablets by mouth daily.  Dispense: 60 tablet; Refill: 6 - isosorbide mononitrate (IMDUR) 60 MG 24 hr tablet; Take 1 tablet (60 mg total) by mouth daily.  Dispense: 30 tablet; Refill: 6 - cloNIDine (CATAPRES) tablet 0.2 mg  4. Other diabetic neurological complication associated with type 2 diabetes mellitus (HCC) Stable - gabapentin (NEURONTIN) 300 MG capsule; Take 1 capsule (300 mg total) by mouth 2 (two) times daily. For diabetic neuropathy  Dispense: 60 capsule; Refill: 6  5. Idiopathic chronic gout without tophus, unspecified site No recent flare - allopurinol (ZYLOPRIM) 300 MG tablet; Take 1 tablet (300 mg total) by mouth daily. For prevention of gout  Dispense: 30 tablet; Refill: 6  6. Stage 3 chronic kidney disease (HCC) Combination of hypertensive and  diabetic nephropathy  7. Smoker Spent 3 minutes counseling on the need for cessation, hazardous effects of tobacco use and offered nicotine replacement therapies however he is not interested  in quitting.   Meds ordered this encounter  Medications  . tamsulosin (FLOMAX) 0.4 MG CAPS capsule    Sig: Take 1 capsule (0.4 mg total) by mouth daily.    Dispense:  30 capsule    Refill:  6  . amLODipine (NORVASC) 10 MG tablet    Sig: Take 1 tablet (10 mg total) by mouth daily. For hypertension    Dispense:  30 tablet    Refill:  6  . lisinopril-hydrochlorothiazide (ZESTORETIC) 20-12.5 MG tablet    Sig: Take 2 tablets by mouth daily.    Dispense:  60 tablet    Refill:  6  . glipiZIDE (GLUCOTROL) 5 MG tablet    Sig: Take 1 tablet (5 mg total) by mouth 2 (two) times daily before a meal.    Dispense:  60 tablet    Refill:  6  . atorvastatin (LIPITOR) 20 MG tablet    Sig: Take 1 tablet (20 mg total) by mouth daily. For hypercholesterolemia    Dispense:  30 tablet    Refill:  6  . gabapentin (NEURONTIN) 300 MG capsule    Sig: Take 1 capsule (300 mg total) by mouth 2 (two) times daily. For diabetic neuropathy    Dispense:  60 capsule    Refill:  6  . allopurinol (ZYLOPRIM) 300 MG tablet    Sig: Take 1 tablet (300 mg total) by mouth daily. For prevention of gout    Dispense:  30 tablet    Refill:  6  . isosorbide mononitrate (IMDUR) 60 MG 24 hr tablet    Sig: Take 1 tablet (60 mg total) by mouth daily.    Dispense:  30 tablet    Refill:  6  . cloNIDine (CATAPRES) tablet 0.2 mg    Follow-up: Return in about 1 month (around 10/14/2018) for Hypertension.       Charlott Rakes, MD, FAAFP. [facility] [place], [place]   09/14/2018, 9:40 AM

## 2018-09-14 NOTE — Patient Instructions (Signed)

## 2018-09-15 LAB — LIPID PANEL
Chol/HDL Ratio: 6.5 ratio — ABNORMAL HIGH (ref 0.0–5.0)
Cholesterol, Total: 207 mg/dL — ABNORMAL HIGH (ref 100–199)
HDL: 32 mg/dL — ABNORMAL LOW (ref >39)
LDL Calculated: 112 mg/dL — ABNORMAL HIGH (ref 0–99)
Triglycerides: 317 mg/dL — ABNORMAL HIGH (ref 0–149)
VLDL Cholesterol Cal: 63 mg/dL — ABNORMAL HIGH (ref 5–40)

## 2018-09-24 ENCOUNTER — Telehealth: Payer: Self-pay

## 2018-09-24 NOTE — Telephone Encounter (Signed)
-----   Message from Charlott Rakes, MD sent at 09/15/2018  8:43 AM EDT ----- Cholesterol is elevated but this could be due to the fact that he ran out of his medications.  Advised to comply with cholesterol pills, lifestyle modifications.

## 2018-09-24 NOTE — Telephone Encounter (Signed)
Patient name and DOB has been verified Patient was informed of lab results. Patient had no questions.  

## 2018-10-15 ENCOUNTER — Ambulatory Visit: Payer: Self-pay | Attending: Family Medicine | Admitting: Family Medicine

## 2018-10-15 ENCOUNTER — Encounter: Payer: Self-pay | Admitting: Family Medicine

## 2018-10-15 ENCOUNTER — Other Ambulatory Visit: Payer: Self-pay

## 2018-10-15 VITALS — BP 150/98 | HR 88 | Ht 73.0 in | Wt 266.4 lb

## 2018-10-15 DIAGNOSIS — I10 Essential (primary) hypertension: Secondary | ICD-10-CM

## 2018-10-15 DIAGNOSIS — E119 Type 2 diabetes mellitus without complications: Secondary | ICD-10-CM

## 2018-10-15 LAB — GLUCOSE, POCT (MANUAL RESULT ENTRY): POC Glucose: 171 mg/dl — AB (ref 70–99)

## 2018-10-15 NOTE — Progress Notes (Signed)
Patient is fasting and has not taken morning medications.

## 2018-10-15 NOTE — Progress Notes (Signed)
Subjective:  Patient ID: Craig West, male    DOB: 01-27-1966  Age: 53 y.o. MRN: [id number]  CC: Diabetes and Hypertension   HPI Craig West is a 53 year old male with a history of hypertension, type 2 diabetes mellitus (A1c 8.1), asthma, gout, non compliance here for follow-up of Hypertension. Last month his BP was 220/150 and he had been out of his antihypertensive. Since then he has picked up his medications but did not take them today as he is fasting in anticipation of labs. He feels fine and has no chest pain or dyspnea and has no other concerns today.  Past Medical History:  Diagnosis Date   Asthma    Diabetes mellitus without complication ([place])    Gout    Hypertension     History reviewed. No pertinent surgical history.  History reviewed. No pertinent family history.  No Known Allergies  Outpatient Medications Prior to Visit  Medication Sig Dispense Refill   albuterol (PROVENTIL HFA;VENTOLIN HFA) 108 (90 Base) MCG/ACT inhaler Inhale 2 puffs into the lungs every 6 (six) hours as needed for wheezing or shortness of breath. 1 Inhaler 2   allopurinol (ZYLOPRIM) 300 MG tablet Take 1 tablet (300 mg total) by mouth daily. For prevention of gout 30 tablet 6   amLODipine (NORVASC) 10 MG tablet Take 1 tablet (10 mg total) by mouth daily. For hypertension 30 tablet 6   atorvastatin (LIPITOR) 20 MG tablet Take 1 tablet (20 mg total) by mouth daily. For hypercholesterolemia 30 tablet 6   Blood Glucose Monitoring Suppl (TRUE METRIX METER) w/Device KIT Check blood sugar fasting and ant bedtime and record 1 kit 0   colchicine 0.6 MG tablet Take 2 tablets (1.2 mg) orally at the onset of a gout attack; may repeat 1 tablet (0.6 mg) in 2 hours if symptoms persist 60 tablet 2   gabapentin (NEURONTIN) 300 MG capsule Take 1 capsule (300 mg total) by mouth 2 (two) times daily. For diabetic neuropathy 60 capsule 6   glipiZIDE (GLUCOTROL) 5 MG tablet Take 1 tablet (5 mg total) by  mouth 2 (two) times daily before a meal. 60 tablet 6   glucose blood (TRUE METRIX BLOOD GLUCOSE TEST) test strip Use as instructed 100 each 12   isosorbide mononitrate (IMDUR) 60 MG 24 hr tablet Take 1 tablet (60 mg total) by mouth daily. 30 tablet 6   lisinopril-hydrochlorothiazide (ZESTORETIC) 20-12.5 MG tablet Take 2 tablets by mouth daily. 60 tablet 6   tamsulosin (FLOMAX) 0.4 MG CAPS capsule Take 1 capsule (0.4 mg total) by mouth daily. 30 capsule 6   HYDROcodone-acetaminophen (NORCO/VICODIN) 5-325 MG tablet Take 1 tablet by mouth every 4 (four) hours as needed. (Patient not taking: Reported on 06/11/2017) 6 tablet 0   oseltamivir (TAMIFLU) 30 MG capsule Take 1 capsule (30 mg total) by mouth 2 (two) times daily. (Patient not taking: Reported on 06/15/2018) 10 capsule 0   TRUEPLUS LANCETS 28G MISC Check blood sugar fasting and at bedtime (Patient not taking: Reported on 06/15/2018) 100 each 2   No facility-administered medications prior to visit.      ROS Review of Systems  Constitutional: Negative for activity change and appetite change.  HENT: Negative for sinus pressure and sore throat.   Eyes: Negative for visual disturbance.  Respiratory: Negative for cough, chest tightness and shortness of breath.   Cardiovascular: Negative for chest pain and leg swelling.  Gastrointestinal: Negative for abdominal distention, abdominal pain, constipation and diarrhea.  Endocrine: Negative.  Genitourinary: Negative for dysuria.  Musculoskeletal: Negative for joint swelling and myalgias.  Skin: Negative for rash.  Allergic/Immunologic: Negative.   Neurological: Negative for weakness, light-headedness and numbness.  Psychiatric/Behavioral: Negative for dysphoric mood and suicidal ideas.    Objective:  BP (!) 150/98    Pulse 88    Ht 6' 1" (1.854 m)    Wt 266 lb 6.4 oz (120.8 kg)    SpO2 97%    BMI 35.15 kg/m   BP/Weight 10/15/2018 09/14/2018 7/34/2876  Systolic BP 811 572 620  Diastolic  BP 98 355 974  Wt. (Lbs) 266.4 253.8 259  BMI 35.15 33.48 34.17      Physical Exam Constitutional:      Appearance: He is well-developed.  Cardiovascular:     Rate and Rhythm: Normal rate.     Heart sounds: Normal heart sounds. No murmur.  Pulmonary:     Effort: Pulmonary effort is normal.     Breath sounds: Normal breath sounds. No wheezing or rales.  Chest:     Chest wall: No tenderness.  Abdominal:     General: Bowel sounds are normal. There is no distension.     Palpations: Abdomen is soft. There is no mass.     Tenderness: There is no abdominal tenderness.  Musculoskeletal: Normal range of motion.  Neurological:     Mental Status: He is alert and oriented to person, place, and time.     CMP Latest Ref Rng & Units 05/19/2018 03/10/2018 11/03/2017  Glucose 70 - 99 mg/dL 118(H) 159(H) 247(H)  BUN 6 - 20 mg/dL 24(H) 25(H) 22(H)  Creatinine 0.61 - 1.24 mg/dL 2.39(H) 1.91(H) 2.30(H)  Sodium 135 - 145 mmol/L 136 137 140  Potassium 3.5 - 5.1 mmol/L 4.6 3.8 3.6  Chloride 98 - 111 mmol/L 103 93(L) 100  CO2 22 - 32 mmol/L _0 Calcium 8.9 - 10.3 mg/dL 8.8(L) 9.0 8.7(L)  Total Protein 6.5 - 8.1 g/dL 7.0 6.8 6.9  Total Bilirubin 0.3 - 1.2 mg/dL 1.3(H) 1.0 0.8  Alkaline Phos 38 - 126 U/L 81 108 113  AST 15 - 41 U/L 105(H) 35 44(H)  ALT 0 - 44 U/L 75(H) 34 45(H)    Lipid Panel     Component Value Date/Time   CHOL 207 (H) 09/14/2018 1107   TRIG 317 (H) 09/14/2018 1107   HDL 32 (L) 09/14/2018 1107   CHOLHDL 6.5 (H) 09/14/2018 1107   CHOLHDL 5.2 (H) 10/23/2015 1009   VLDL 46 (H) 10/23/2015 1009   LDLCALC 112 (H) 09/14/2018 1107    CBC    Component Value Date/Time   WBC 10.8 (H) 05/19/2018 1659   RBC 4.89 05/19/2018 1659   HGB 14.1 05/19/2018 1659   HGB 14.5 07/11/2016 0949   HCT 44.1 05/19/2018 1659   HCT 43.0 07/11/2016 0949   PLT 208 05/19/2018 1659   PLT 223 07/11/2016 0949   MCV 90.2 05/19/2018 1659   MCV 89 07/11/2016 0949   MCH 28.8 05/19/2018 1659    MCHC 32.0 05/19/2018 1659   RDW 14.1 05/19/2018 1659   RDW 13.8 07/11/2016 0949   LYMPHSABS 0.7 05/19/2018 1659   LYMPHSABS 0.7 07/11/2016 0949   MONOABS 1.0 05/19/2018 1659   EOSABS 0.1 05/19/2018 1659   EOSABS 0.0 07/11/2016 0949   BASOSABS 0.0 05/19/2018 1659   BASOSABS 0.0 07/11/2016 0949    Lab Results  Component Value Date   HGBA1C 8.1 (A) 09/14/2018    Assessment & Plan:   1.  Type 2 diabetes mellitus without complication, without long-term current use of insulin (HCC) Uncontrolled with A1c of 8.1 secondary to medication non compliance which was addressed last month and he is back on track Next A1c due in 2 months Counseled on Diabetic diet, my plate method, 941 minutes of moderate intensity exercise/week Keep blood sugar logs with fasting goals of 80-120 mg/dl, random of less than 180 and in the event of sugars less than 60 mg/dl or greater than 400 mg/dl please notify the clinic ASAP. It is recommended that you undergo annual eye exams and annual foot exams. Pneumonia vaccine is recommended. - POCT glucose (manual entry) - CMP14+EGFR - Microalbumin/Creatinine Ratio, Urine  2. Essential hypertension Uncontrolled due to not taking antihypertensive Compliance emphasized Counseled on blood pressure goal of less than 130/80, low-sodium, DASH diet, medication compliance, 150 minutes of moderate intensity exercise per week. Discussed medication compliance, adverse effects.    Health Care Maintenance: Due for eye exam, colonoscopy. Lack of medical coverage precludes referral, advised to apply for [facility] discount. Discussed community resources including [facility] for eye exam.  No orders of the defined types were placed in this encounter.   Follow-up: Return in about 3 months (around 01/15/2019) for medical conditions.       Charlott Rakes, MD, FAAFP. [facility] [facility] [place], [place]   10/15/2018, 8:56 AM

## 2018-10-15 NOTE — Patient Instructions (Signed)

## 2018-10-16 ENCOUNTER — Other Ambulatory Visit: Payer: Self-pay | Admitting: Family Medicine

## 2018-10-16 ENCOUNTER — Telehealth: Payer: Self-pay

## 2018-10-16 DIAGNOSIS — N183 Chronic kidney disease, stage 3 unspecified: Secondary | ICD-10-CM

## 2018-10-16 LAB — CMP14+EGFR
ALT: 55 IU/L — ABNORMAL HIGH (ref 0–44)
AST: 52 IU/L — ABNORMAL HIGH (ref 0–40)
Albumin/Globulin Ratio: 1.4 (ref 1.2–2.2)
Albumin: 4.2 g/dL (ref 3.8–4.9)
Alkaline Phosphatase: 129 IU/L — ABNORMAL HIGH (ref 39–117)
BUN/Creatinine Ratio: 13 (ref 9–20)
BUN: 33 mg/dL — ABNORMAL HIGH (ref 6–24)
Bilirubin Total: 0.4 mg/dL (ref 0.0–1.2)
CO2: 16 mmol/L — ABNORMAL LOW (ref 20–29)
Calcium: 9.3 mg/dL (ref 8.7–10.2)
Chloride: 105 mmol/L (ref 96–106)
Creatinine, Ser: 2.48 mg/dL — ABNORMAL HIGH (ref 0.76–1.27)
GFR calc Af Amer: 33 mL/min/{1.73_m2} — ABNORMAL LOW (ref >59)
GFR calc non Af Amer: 28 mL/min/{1.73_m2} — ABNORMAL LOW (ref >59)
Globulin, Total: 3.1 g/dL (ref 1.5–4.5)
Glucose: 156 mg/dL — ABNORMAL HIGH (ref 65–99)
Potassium: 5.5 mmol/L — ABNORMAL HIGH (ref 3.5–5.2)
Sodium: 139 mmol/L (ref 134–144)
Total Protein: 7.3 g/dL (ref 6.0–8.5)

## 2018-10-16 LAB — MICROALBUMIN / CREATININE URINE RATIO
Creatinine, Urine: 90.6 mg/dL
Microalb/Creat Ratio: 164 mg/g creat — ABNORMAL HIGH (ref 0–29)
Microalbumin, Urine: 148.5 ug/mL

## 2018-10-16 MED ORDER — SODIUM POLYSTYRENE SULFONATE 15 GM/60ML PO SUSP: 15.0000 g | Freq: Once | ORAL | 0 refills | Status: AC

## 2018-10-16 MED FILL — SPS 15 GM/60 ML SUSPENSION: 15 | 4 days supply | Qty: 240 | Fill #0

## 2018-10-16 NOTE — Telephone Encounter (Signed)
Patient name and DOB has been verified Patient was informed of lab results. Patient had no questions.  

## 2018-10-16 NOTE — Telephone Encounter (Signed)
-----   Message from Charlott Rakes, MD sent at 10/16/2018 11:10 AM EDT ----- Kidney function is abnormal and I am referring him to nephrology.  Please advise him to work on his application for [facility] discount to facilitate this referral.  Potassium is elevated due to his kidney abnormality.  I have sent a prescription to his pharmacy for Kayexalate.

## 2018-10-19 MED FILL — !COLCRYS 0.6 MG TABLET: 0.6 MG | 15 days supply | Qty: 30 | Fill #2

## 2018-10-19 MED FILL — TAMSULOSIN HCL 0.4 MG CAP: 0.4 | 30 days supply | Qty: 30 | Fill #1

## 2018-10-19 MED FILL — ?ATORVASTATIN 20 MG TABLET: 20 | 30 days supply | Qty: 30 | Fill #1

## 2018-10-19 MED FILL — ?GLIPIZIDE 5MG TABLET: 5 | 30 days supply | Qty: 60 | Fill #1

## 2018-10-19 MED FILL — ?ALLOPURINOL 300 MG TABLET: 300 | 30 days supply | Qty: 30 | Fill #2

## 2018-10-19 MED FILL — GABAPENTIN 300 MG CAPSULE: 300 | 30 days supply | Qty: 60 | Fill #1

## 2018-10-19 MED FILL — ISOSORBIDE MN ER 60 MG TAB: 60 | 30 days supply | Qty: 30 | Fill #1

## 2018-10-19 MED FILL — LISINOPRIL-HCTZ 20-12.5 MG: 20-12.5 | 30 days supply | Qty: 60 | Fill #1

## 2018-10-19 MED FILL — ?AMLODIPINE BESYLATE 10 MG: 10 | 30 days supply | Qty: 30 | Fill #1

## 2019-01-18 ENCOUNTER — Ambulatory Visit: Payer: Self-pay | Attending: Family Medicine | Admitting: Family Medicine

## 2019-01-18 ENCOUNTER — Other Ambulatory Visit: Payer: Self-pay

## 2019-01-18 ENCOUNTER — Encounter: Payer: Self-pay | Admitting: Family Medicine

## 2019-01-18 VITALS — BP 200/132 | HR 90 | Temp 98.0°F | Ht 73.0 in | Wt 265.0 lb

## 2019-01-18 DIAGNOSIS — I10 Essential (primary) hypertension: Secondary | ICD-10-CM

## 2019-01-18 DIAGNOSIS — F17200 Nicotine dependence, unspecified, uncomplicated: Secondary | ICD-10-CM

## 2019-01-18 DIAGNOSIS — Z23 Encounter for immunization: Secondary | ICD-10-CM

## 2019-01-18 DIAGNOSIS — F17210 Nicotine dependence, cigarettes, uncomplicated: Secondary | ICD-10-CM

## 2019-01-18 DIAGNOSIS — E1122 Type 2 diabetes mellitus with diabetic chronic kidney disease: Secondary | ICD-10-CM

## 2019-01-18 DIAGNOSIS — Z9114 Patient's other noncompliance with medication regimen: Secondary | ICD-10-CM

## 2019-01-18 DIAGNOSIS — Z1211 Encounter for screening for malignant neoplasm of colon: Secondary | ICD-10-CM

## 2019-01-18 DIAGNOSIS — N184 Chronic kidney disease, stage 4 (severe): Secondary | ICD-10-CM

## 2019-01-18 LAB — POCT GLYCOSYLATED HEMOGLOBIN (HGB A1C): HbA1c, POC (controlled diabetic range): 8.7 % — AB (ref 0.0–7.0)

## 2019-01-18 LAB — GLUCOSE, POCT (MANUAL RESULT ENTRY): POC Glucose: 207 mg/dl — AB (ref 70–99)

## 2019-01-18 MED ORDER — ATORVASTATIN CALCIUM 40 MG PO TABS: 40.0000 mg | ORAL_TABLET | Freq: Every day | ORAL | 6 refills | Status: DC

## 2019-01-18 MED ORDER — CLONIDINE HCL 0.2 MG PO TABS
0.2000 mg | ORAL_TABLET | Freq: Once | ORAL | Status: AC
  Administered 2019-01-18: 09:00:00 0.2 mg via ORAL

## 2019-01-18 MED ORDER — GLIPIZIDE 10 MG PO TABS: 10.0000 mg | ORAL_TABLET | Freq: Two times a day (BID) | ORAL | 6 refills | Status: DC

## 2019-01-18 MED FILL — ?GLIPIZIDE 10 MG TABLET: 10 | 15 days supply | Qty: 30 | Fill #0

## 2019-01-18 MED FILL — ?ATORVASTATIN 40MG TABLET: 40 | 30 days supply | Qty: 30 | Fill #0

## 2019-01-18 NOTE — Progress Notes (Signed)
Subjective:  Patient ID: Craig West, male    DOB: Jul 11, 1965  Age: 53 y.o. MRN: [id number]  CC: Diabetes   HPI Craig West  is a 53 year old male with a history of hypertension, type 2 diabetes mellitus (A1c 8.7), asthma, gout, stage IV chronic kidney disease, non compliance here for follow-up of Hypertension He is A1c is 8.7 which is up from 8.1 previously and he has not been compliant with a diabetic diet, exercise or his medications.  Endorses ingesting lots of fast food.  Denies numbness in extremities, visual concerns. His blood pressure is 200/132 today and he is yet to take his antihypertensives which he has not taken for the last 3 days and informs me " I feel fine".  States he leaves his medications at work so he can take them during the weekdays but on the weekends he does not have anything to take.  Denies blurry vision, chest pains, dyspnea.  Clonidine 0.2 mg has been administered in the clinic. He has had no gout flares or asthma flares.  I referred him to nephrology 3 months ago for chronic kidney disease and notes in his chart indicate [facility] however he has had no appointment yet. He continues to smoke 3 to 4 cigarettes/day and does not plan on quitting.  Past Medical History:  Diagnosis Date  . Asthma   . Diabetes mellitus without complication ([place])   . Gout   . Hypertension     History reviewed. No pertinent surgical history.  History reviewed. No pertinent family history.  Social History   Socioeconomic History  . Marital status: Single    Spouse name: Not on file  . Number of children: Not on file  . Years of education: Not on file  . Highest education level: Not on file  Occupational History  . Not on file  Social Needs  . Financial resource strain: Not on file  . Food insecurity    Worry: Not on file    Inability: Not on file  . Transportation needs    Medical: Not on file    Non-medical: Not on file   Tobacco Use  . Smoking status: Current Every Day Smoker    Packs/day: 0.50    Types: Cigarettes  . Smokeless tobacco: Never Used  . Tobacco comment: Pt states he started smoking again 7 years ago  Substance and Sexual Activity  . Alcohol use: Yes    Alcohol/week: 0.0 standard drinks    Comment: occ  . Drug use: No  . Sexual activity: Not on file  Lifestyle  . Physical activity    Days per week: Not on file    Minutes per session: Not on file  . Stress: Not on file  Relationships  . Social Herbalist on phone: Not on file    Gets together: Not on file    Attends religious service: Not on file    Active member of club or organization: Not on file    Attends meetings of clubs or organizations: Not on file    Relationship status: Not on file  . Intimate partner violence    Fear of current or ex partner: Not on file    Emotionally abused: Not on file    Physically abused: Not on file    Forced sexual activity: Not on file  Other Topics Concern  . Not on file  Social History Narrative  . Not on file  No Known Allergies  Outpatient Medications Prior to Visit  Medication Sig Dispense Refill  . albuterol (PROVENTIL HFA;VENTOLIN HFA) 108 (90 Base) MCG/ACT inhaler Inhale 2 puffs into the lungs every 6 (six) hours as needed for wheezing or shortness of breath. 1 Inhaler 2  . allopurinol (ZYLOPRIM) 300 MG tablet Take 1 tablet (300 mg total) by mouth daily. For prevention of gout 30 tablet 6  . amLODipine (NORVASC) 10 MG tablet Take 1 tablet (10 mg total) by mouth daily. For hypertension 30 tablet 6  . Blood Glucose Monitoring Suppl (TRUE METRIX METER) w/Device KIT Check blood sugar fasting and ant bedtime and record 1 kit 0  . colchicine 0.6 MG tablet Take 2 tablets (1.2 mg) orally at the onset of a gout attack; may repeat 1 tablet (0.6 mg) in 2 hours if symptoms persist 60 tablet 2  . gabapentin (NEURONTIN) 300 MG capsule Take 1 capsule (300 mg total) by mouth 2 (two)  times daily. For diabetic neuropathy 60 capsule 6  . glucose blood (TRUE METRIX BLOOD GLUCOSE TEST) test strip Use as instructed 100 each 12  . isosorbide mononitrate (IMDUR) 60 MG 24 hr tablet Take 1 tablet (60 mg total) by mouth daily. 30 tablet 6  . lisinopril-hydrochlorothiazide (ZESTORETIC) 20-12.5 MG tablet Take 2 tablets by mouth daily. 60 tablet 6  . tamsulosin (FLOMAX) 0.4 MG CAPS capsule Take 1 capsule (0.4 mg total) by mouth daily. 30 capsule 6  . TRUEPLUS LANCETS 28G MISC Check blood sugar fasting and at bedtime 100 each 2  . atorvastatin (LIPITOR) 20 MG tablet Take 1 tablet (20 mg total) by mouth daily. For hypercholesterolemia 30 tablet 6  . glipiZIDE (GLUCOTROL) 5 MG tablet Take 1 tablet (5 mg total) by mouth 2 (two) times daily before a meal. 60 tablet 6  . HYDROcodone-acetaminophen (NORCO/VICODIN) 5-325 MG tablet Take 1 tablet by mouth every 4 (four) hours as needed. (Patient not taking: Reported on 06/11/2017) 6 tablet 0  . oseltamivir (TAMIFLU) 30 MG capsule Take 1 capsule (30 mg total) by mouth 2 (two) times daily. (Patient not taking: Reported on 06/15/2018) 10 capsule 0   No facility-administered medications prior to visit.      ROS Review of Systems  Constitutional: Negative for activity change and appetite change.  HENT: Negative for sinus pressure and sore throat.   Eyes: Negative for visual disturbance.  Respiratory: Negative for cough, chest tightness and shortness of breath.   Cardiovascular: Negative for chest pain and leg swelling.  Gastrointestinal: Negative for abdominal distention, abdominal pain, constipation and diarrhea.  Endocrine: Negative.   Genitourinary: Negative for dysuria.  Musculoskeletal: Negative for joint swelling and myalgias.  Skin: Negative for rash.  Allergic/Immunologic: Negative.   Neurological: Negative for weakness, light-headedness and numbness.  Psychiatric/Behavioral: Negative for dysphoric mood and suicidal ideas.    Objective:   BP (!) 200/132   Pulse 90   Temp 98 F (36.7 C) (Oral)   Ht 6' 1"  (1.854 m)   Wt 265 lb (120.2 kg)   SpO2 95%   BMI 34.96 kg/m   BP/Weight 01/18/2019 10/15/2018 2/63/3354  Systolic BP 562 563 893  Diastolic BP 734 98 287  Wt. (Lbs) 265 266.4 253.8  BMI 34.96 35.15 33.48      Physical Exam Constitutional:      Appearance: He is well-developed.  Neck:     Vascular: No JVD.  Cardiovascular:     Rate and Rhythm: Normal rate.     Heart sounds: Normal heart  sounds. No murmur.  Pulmonary:     Effort: Pulmonary effort is normal.     Breath sounds: Normal breath sounds. No wheezing or rales.  Chest:     Chest wall: No tenderness.  Abdominal:     General: Bowel sounds are normal. There is no distension.     Palpations: Abdomen is soft. There is no mass.     Tenderness: There is no abdominal tenderness.  Musculoskeletal: Normal range of motion.     Right lower leg: No edema.     Left lower leg: No edema.  Neurological:     Mental Status: He is alert and oriented to person, place, and time.  Psychiatric:        Mood and Affect: Mood normal.     CMP Latest Ref Rng & Units 10/15/2018 05/19/2018 03/10/2018  Glucose 65 - 99 mg/dL 156(H) 118(H) 159(H)  BUN 6 - 24 mg/dL 33(H) 24(H) 25(H)  Creatinine 0.76 - 1.27 mg/dL 2.48(H) 2.39(H) 1.91(H)  Sodium 134 - 144 mmol/L 139 136 137  Potassium 3.5 - 5.2 mmol/L 5.5(H) 4.6 3.8  Chloride 96 - 106 mmol/L 105 103 93(L)  CO2 20 - 29 mmol/L 16(L) 22 27  Calcium 8.7 - 10.2 mg/dL 9.3 8.8(L) 9.0  Total Protein 6.0 - 8.5 g/dL 7.3 7.0 6.8  Total Bilirubin 0.0 - 1.2 mg/dL 0.4 1.3(H) 1.0  Alkaline Phos 39 - 117 IU/L 129(H) 81 108  AST 0 - 40 IU/L 52(H) 105(H) 35  ALT 0 - 44 IU/L 55(H) 75(H) 34    Lipid Panel     Component Value Date/Time   CHOL 207 (H) 09/14/2018 1107   TRIG 317 (H) 09/14/2018 1107   HDL 32 (L) 09/14/2018 1107   CHOLHDL 6.5 (H) 09/14/2018 1107   CHOLHDL 5.2 (H) 10/23/2015 1009   VLDL 46 (H) 10/23/2015 1009   LDLCALC  112 (H) 09/14/2018 1107    CBC    Component Value Date/Time   WBC 10.8 (H) 05/19/2018 1659   RBC 4.89 05/19/2018 1659   HGB 14.1 05/19/2018 1659   HGB 14.5 07/11/2016 0949   HCT 44.1 05/19/2018 1659   HCT 43.0 07/11/2016 0949   PLT 208 05/19/2018 1659   PLT 223 07/11/2016 0949   MCV 90.2 05/19/2018 1659   MCV 89 07/11/2016 0949   MCH 28.8 05/19/2018 1659   MCHC 32.0 05/19/2018 1659   RDW 14.1 05/19/2018 1659   RDW 13.8 07/11/2016 0949   LYMPHSABS 0.7 05/19/2018 1659   LYMPHSABS 0.7 07/11/2016 0949   MONOABS 1.0 05/19/2018 1659   EOSABS 0.1 05/19/2018 1659   EOSABS 0.0 07/11/2016 0949   BASOSABS 0.0 05/19/2018 1659   BASOSABS 0.0 07/11/2016 0949    Lab Results  Component Value Date   HGBA1C 8.7 (A) 01/18/2019    Assessment & Plan:   1. Type 2 diabetes mellitus with stage 4 chronic kidney disease, without long-term current use of insulin (HCC) Uncontrolled with A1c of 8.7 Increased dose of glipizide Counseled on Diabetic diet, my plate method, 932 minutes of moderate intensity exercise/week Blood sugar logs with fasting goals of 80-120 mg/dl, random of less than 180 and in the event of sugars less than 60 mg/dl or greater than 400 mg/dl encouraged to notify the clinic. Advised on the need for annual eye exams, annual foot exams, Pneumonia vaccine. - POCT glucose (manual entry) - POCT glycosylated hemoglobin (Hb A1C) - atorvastatin (LIPITOR) 40 MG tablet; Take 1 tablet (40 mg total) by mouth daily. For hypercholesterolemia  Dispense: 30 tablet; Refill: 6 - glipiZIDE (GLUCOTROL) 10 MG tablet; Take 1 tablet (10 mg total) by mouth 2 (two) times daily before a meal.  Dispense: 30 tablet; Refill: 6  2. Accelerated hypertension Hypertensive urgency with high risk of developing complications including but not limited to cardiovascular complications No evidence of endorgan damage at this time We will administer 0.2 mg of clonidine in the clinic stat Observed in the clinic for  30 minutes and blood pressure repeated Poor compliance largely contributory and he is nonchalant justice at previous visits - cloNIDine (CATAPRES) tablet 0.2 mg  3. Need for immunization against influenza Flu shot administered today  4. Smoker Spent 3 minutes counseling on smoking cessation and he is not ready to quit at this time  5. Screening for colon cancer - Fecal occult blood, imunochemical([facility])  6. Stage 4 chronic kidney disease (HCC) Combination of hypertensive and diabetic nephropathy I referred him to nephrology 3 months ago however he has not followed through I have provided the number to [facility] for him to schedule an appointment Avoid nephrotoxins - Basic Metabolic Panel  7. Non compliance w medication regimen I have again emphasized the need to be compliant with his medications as he puts himself at risk of developing complications.  He does not seem to be bothered by this. We have discussed strategies to aid compliance with medications.   Health Care Maintenance: Flu shot today.  Meds ordered this encounter  Medications  . atorvastatin (LIPITOR) 40 MG tablet    Sig: Take 1 tablet (40 mg total) by mouth daily. For hypercholesterolemia    Dispense:  30 tablet    Refill:  6    Discontinue previous dose  . glipiZIDE (GLUCOTROL) 10 MG tablet    Sig: Take 1 tablet (10 mg total) by mouth 2 (two) times daily before a meal.    Dispense:  30 tablet    Refill:  6    Dose increase  . cloNIDine (CATAPRES) tablet 0.2 mg    Follow-up: Return in about 3 months (around 04/20/2019) for medical conditions.       Charlott Rakes, MD, FAAFP. [facility] [place], [place]   01/18/2019, 12:34 PM

## 2019-01-18 NOTE — Patient Instructions (Addendum)
Please call [facility] for your Nephrology appointment. Ph. # [phone] Address [address] [place] [address]

## 2019-01-19 LAB — BASIC METABOLIC PANEL
BUN/Creatinine Ratio: 16 (ref 9–20)
BUN: 35 mg/dL — ABNORMAL HIGH (ref 6–24)
CO2: 23 mmol/L (ref 20–29)
Calcium: 9.6 mg/dL (ref 8.7–10.2)
Chloride: 100 mmol/L (ref 96–106)
Creatinine, Ser: 2.23 mg/dL — ABNORMAL HIGH (ref 0.76–1.27)
GFR calc Af Amer: 37 mL/min/{1.73_m2} — ABNORMAL LOW (ref >59)
GFR calc non Af Amer: 32 mL/min/{1.73_m2} — ABNORMAL LOW (ref >59)
Glucose: 201 mg/dL — ABNORMAL HIGH (ref 65–99)
Potassium: 5 mmol/L (ref 3.5–5.2)
Sodium: 137 mmol/L (ref 134–144)

## 2019-01-22 ENCOUNTER — Telehealth: Payer: Self-pay

## 2019-01-22 NOTE — Telephone Encounter (Signed)
-----   Message from Charlott Rakes, MD sent at 01/20/2019  1:33 PM EDT ----- Labs reveal abnormal kidney function with chronic kidney disease stage III-IV.  I will need him to call [facility] and schedule an appointment; phone number has been provided at his visit with me yesterday.

## 2019-01-22 NOTE — Telephone Encounter (Signed)
Patient was called and a voicemail was left informing patient to return phone call for lab results. 

## 2019-01-28 ENCOUNTER — Telehealth: Payer: Self-pay

## 2019-01-28 ENCOUNTER — Telehealth: Payer: Self-pay | Admitting: Family Medicine

## 2019-01-28 DIAGNOSIS — N184 Chronic kidney disease, stage 4 (severe): Secondary | ICD-10-CM

## 2019-01-28 NOTE — Telephone Encounter (Signed)
Patient was called and informed of lab results and to contact [facility].  Patient understood and states that he will call and make an appointment.

## 2019-01-28 NOTE — Telephone Encounter (Signed)
Referral has been placed. 

## 2019-01-28 NOTE — Telephone Encounter (Signed)
-----   Message from Charlott Rakes, MD sent at 01/20/2019  1:33 PM EDT ----- Labs reveal abnormal kidney function with chronic kidney disease stage III-IV.  I will need him to call [facility] and schedule an appointment; phone number has been provided at his visit with me yesterday.

## 2019-01-28 NOTE — Telephone Encounter (Signed)
Patient called to set up appointment and was informed new referral needs to be placed.

## 2019-01-28 NOTE — Telephone Encounter (Signed)
Patient called stating he was told by [facility] a new referral needs to be placed. Please follow up.

## 2019-04-02 DIAGNOSIS — U071 COVID-19: Secondary | ICD-10-CM | POA: Insufficient documentation

## 2019-04-02 HISTORY — DX: COVID-19: U07.1

## 2019-04-20 ENCOUNTER — Ambulatory Visit: Payer: Self-pay | Admitting: Family Medicine

## 2019-04-27 ENCOUNTER — Encounter: Payer: Self-pay | Admitting: Family Medicine

## 2019-04-27 ENCOUNTER — Other Ambulatory Visit: Payer: Self-pay

## 2019-04-27 ENCOUNTER — Ambulatory Visit: Payer: Self-pay | Attending: Family Medicine | Admitting: Family Medicine

## 2019-04-27 VITALS — BP 153/92 | HR 87 | Ht 73.0 in | Wt 251.0 lb

## 2019-04-27 DIAGNOSIS — I10 Essential (primary) hypertension: Secondary | ICD-10-CM

## 2019-04-27 DIAGNOSIS — E1122 Type 2 diabetes mellitus with diabetic chronic kidney disease: Secondary | ICD-10-CM

## 2019-04-27 DIAGNOSIS — N1832 Chronic kidney disease, stage 3b: Secondary | ICD-10-CM

## 2019-04-27 DIAGNOSIS — R399 Unspecified symptoms and signs involving the genitourinary system: Secondary | ICD-10-CM

## 2019-04-27 DIAGNOSIS — M1A00X0 Idiopathic chronic gout, unspecified site, without tophus (tophi): Secondary | ICD-10-CM

## 2019-04-27 DIAGNOSIS — E1121 Type 2 diabetes mellitus with diabetic nephropathy: Secondary | ICD-10-CM

## 2019-04-27 DIAGNOSIS — E1149 Type 2 diabetes mellitus with other diabetic neurological complication: Secondary | ICD-10-CM

## 2019-04-27 LAB — POCT GLYCOSYLATED HEMOGLOBIN (HGB A1C): HbA1c, POC (controlled diabetic range): 8.7 % — AB (ref 0.0–7.0)

## 2019-04-27 LAB — GLUCOSE, POCT (MANUAL RESULT ENTRY): POC Glucose: 164 mg/dl — AB (ref 70–99)

## 2019-04-27 MED ORDER — GABAPENTIN 300 MG PO CAPS: 300.0000 mg | ORAL_CAPSULE | Freq: Two times a day (BID) | ORAL | 6 refills | Status: DC

## 2019-04-27 MED ORDER — AMLODIPINE BESYLATE 10 MG PO TABS: 10.0000 mg | ORAL_TABLET | Freq: Every day | ORAL | 6 refills | Status: DC

## 2019-04-27 MED ORDER — GLIPIZIDE 10 MG PO TABS: 10.0000 mg | ORAL_TABLET | Freq: Two times a day (BID) | ORAL | 6 refills | Status: DC

## 2019-04-27 MED ORDER — TAMSULOSIN HCL 0.4 MG PO CAPS: 0.4000 mg | ORAL_CAPSULE | Freq: Every day | ORAL | 6 refills | Status: DC

## 2019-04-27 MED ORDER — LISINOPRIL-HYDROCHLOROTHIAZIDE 20-12.5 MG PO TABS: 2.0000 | ORAL_TABLET | Freq: Every day | ORAL | 6 refills | Status: DC

## 2019-04-27 MED ORDER — ALLOPURINOL 100 MG PO TABS: 200.0000 mg | ORAL_TABLET | Freq: Every day | ORAL | 6 refills | Status: DC

## 2019-04-27 MED ORDER — ISOSORBIDE MONONITRATE ER 60 MG PO TB24: 120.0000 mg | ORAL_TABLET | Freq: Every day | ORAL | 6 refills | Status: DC

## 2019-04-27 MED ORDER — ATORVASTATIN CALCIUM 40 MG PO TABS: 40.0000 mg | ORAL_TABLET | Freq: Every day | ORAL | 6 refills | Status: DC

## 2019-04-27 MED FILL — ?ALLOPURINOL 100MG TABLET: 100 | 30 days supply | Qty: 60 | Fill #0

## 2019-04-27 MED FILL — AMLODIPINE BESYLATE 10 MG T: 10 | 30 days supply | Qty: 30 | Fill #0

## 2019-04-27 MED FILL — GABAPENTIN 300 MG CAPSULE: 300 | 30 days supply | Qty: 60 | Fill #0

## 2019-04-27 MED FILL — TAMSULOSIN HCL 0.4 MG CAP: 0.4 | 30 days supply | Qty: 30 | Fill #0

## 2019-04-27 MED FILL — LISINOPRIL-HCTZ 20-12.5 MG: 20-12.5 | 30 days supply | Qty: 60 | Fill #2

## 2019-04-27 MED FILL — ?GLIPIZIDE 10 MG TABLET: 10 | 15 days supply | Qty: 30 | Fill #0

## 2019-04-27 MED FILL — ISOSORBIDE MN ER 60 MG TAB: 60 | 30 days supply | Qty: 60 | Fill #0

## 2019-04-27 MED FILL — ?ATORVASTATIN 40MG TABL: 40 | 30 days supply | Qty: 30 | Fill #0

## 2019-04-27 NOTE — Patient Instructions (Signed)
Diabetes Mellitus and Exercise Exercising regularly is important for your overall health, especially when you have diabetes (diabetes mellitus). Exercising is not only about losing weight. It has many other health benefits, such as increasing muscle strength and bone density and reducing body fat and stress. This leads to improved fitness, flexibility, and endurance, all of which result in better overall health. Exercise has additional benefits for people with diabetes, including:  Reducing appetite.  Helping to lower and control blood glucose.  Lowering blood pressure.  Helping to control amounts of fatty substances (lipids) in the blood, such as cholesterol and triglycerides.  Helping the body to respond better to insulin (improving insulin sensitivity).  Reducing how much insulin the body needs.  Decreasing the risk for heart disease by: ? Lowering cholesterol and triglyceride levels. ? Increasing the levels of good cholesterol. ? Lowering blood glucose levels. What is my activity plan? Your health care provider or certified diabetes educator can help you make a plan for the type and frequency of exercise (activity plan) that works for you. Make sure that you:  Do at least 150 minutes of moderate-intensity or vigorous-intensity exercise each week. This could be brisk walking, biking, or water aerobics. ? Do stretching and strength exercises, such as yoga or weightlifting, at least 2 times a week. ? Spread out your activity over at least 3 days of the week.  Get some form of physical activity every day. ? Do not go more than 2 days in a row without some kind of physical activity. ? Avoid being inactive for more than 30 minutes at a time. Take frequent breaks to walk or stretch.  Choose a type of exercise or activity that you enjoy, and set realistic goals.  Start slowly, and gradually increase the intensity of your exercise over time. What do I need to know about managing my  diabetes?   Check your blood glucose before and after exercising. ? If your blood glucose is 240 mg/dL (13.3 mmol/L) or higher before you exercise, check your urine for ketones. If you have ketones in your urine, do not exercise until your blood glucose returns to normal. ? If your blood glucose is 100 mg/dL (5.6 mmol/L) or lower, eat a snack containing 15-20 grams of carbohydrate. Check your blood glucose 15 minutes after the snack to make sure that your level is above 100 mg/dL (5.6 mmol/L) before you start your exercise.  Know the symptoms of low blood glucose (hypoglycemia) and how to treat it. Your risk for hypoglycemia increases during and after exercise. Common symptoms of hypoglycemia can include: ? Hunger. ? Anxiety. ? Sweating and feeling clammy. ? Confusion. ? Dizziness or feeling light-headed. ? Increased heart rate or palpitations. ? Blurry vision. ? Tingling or numbness around the mouth, lips, or tongue. ? Tremors or shakes. ? Irritability.  Keep a rapid-acting carbohydrate snack available before, during, and after exercise to help prevent or treat hypoglycemia.  Avoid injecting insulin into areas of the body that are going to be exercised. For example, avoid injecting insulin into: ? The arms, when playing tennis. ? The legs, when jogging.  Keep records of your exercise habits. Doing this can help you and your health care provider adjust your diabetes management plan as needed. Write down: ? Food that you eat before and after you exercise. ? Blood glucose levels before and after you exercise. ? The type and amount of exercise you have done. ? When your insulin is expected to peak, if you use   insulin. Avoid exercising at times when your insulin is peaking.  When you start a new exercise or activity, work with your health care provider to make sure the activity is safe for you, and to adjust your insulin, medicines, or food intake as needed.  Drink plenty of water while  you exercise to prevent dehydration or heat stroke. Drink enough fluid to keep your urine clear or pale yellow. Summary  Exercising regularly is important for your overall health, especially when you have diabetes (diabetes mellitus).  Exercising has many health benefits, such as increasing muscle strength and bone density and reducing body fat and stress.  Your health care provider or certified diabetes educator can help you make a plan for the type and frequency of exercise (activity plan) that works for you.  When you start a new exercise or activity, work with your health care provider to make sure the activity is safe for you, and to adjust your insulin, medicines, or food intake as needed. This information is not intended to replace advice given to you by your health care provider. Make sure you discuss any questions you have with your health care provider. Document Revised: 10/10/2016 Document Reviewed: 08/28/2015 Elsevier Patient Education  2020 [employer]  

## 2019-04-27 NOTE — Progress Notes (Signed)
Established Patient Office Visit  Subjective:  Patient ID: Craig West, male    DOB: 10/12/1965  Age: 54 y.o. MRN: [id number]  CC:  Chief Complaint  Patient presents with  . Hypertension  . Diabetes    HPI Craig West is a 54 year old male with a history of hypertension, type 2 diabetes mellitus (A1c 8.7), asthma, gout, non compliance, and stage IV chronic kidney disease who is here today for a follow up on his chronic illnesses. His A1c is 8.7 which is unchanged from October 2020.  Patient states that he has difficulty affording medication and only takes glipizide approximately half the time.  Endorses food insecurities but when he does eat, it is fast food.  Does not exercise. His blood pressure today is 153/92 which is improved from the previous visit but still elevated.  States he has taken his antihypertensives today.  Endorses compliance when he can afford his medications. Denies having asthma flares or gout flares. He has not made an appointment with nephrology despite referral due to lack of insurance and inability to pay. He continues to smoke up to a half a pack of cigarettes/day and has no plans to quit.  No acute complaints today.   Past Medical History:  Diagnosis Date  . Asthma   . Diabetes mellitus without complication ([place])   . Gout   . Hypertension     History reviewed. No pertinent surgical history.  History reviewed. No pertinent family history.  Social History   Socioeconomic History  . Marital status: Single    Spouse name: Not on file  . Number of children: Not on file  . Years of education: Not on file  . Highest education level: Not on file  Occupational History  . Not on file  Tobacco Use  . Smoking status: Current Every Day Smoker    Packs/day: 0.50    Types: Cigarettes  . Smokeless tobacco: Never Used  . Tobacco comment: Pt states he started smoking again 7 years ago  Substance and Sexual Activity  . Alcohol use: Yes    Alcohol/week:  0.0 standard drinks    Comment: occ  . Drug use: No  . Sexual activity: Not on file  Other Topics Concern  . Not on file  Social History Narrative  . Not on file   Social Determinants of Health   Financial Resource Strain:   . Difficulty of Paying Living Expenses: Not on file  Food Insecurity:   . Worried About Charity fundraiser in the Last Year: Not on file  . Ran Out of Food in the Last Year: Not on file  Transportation Needs:   . Lack of Transportation (Medical): Not on file  . Lack of Transportation (Non-Medical): Not on file  Physical Activity:   . Days of Exercise per Week: Not on file  . Minutes of Exercise per Session: Not on file  Stress:   . Feeling of Stress : Not on file  Social Connections:   . Frequency of Communication with Friends and Family: Not on file  . Frequency of Social Gatherings with Friends and Family: Not on file  . Attends Religious Services: Not on file  . Active Member of Clubs or Organizations: Not on file  . Attends Archivist Meetings: Not on file  . Marital Status: Not on file  Intimate Partner Violence:   . Fear of Current or Ex-Partner: Not on file  . Emotionally Abused: Not on file  . Physically Abused:  Not on file  . Sexually Abused: Not on file    Outpatient Medications Prior to Visit  Medication Sig Dispense Refill  . albuterol (PROVENTIL HFA;VENTOLIN HFA) 108 (90 Base) MCG/ACT inhaler Inhale 2 puffs into the lungs every 6 (six) hours as needed for wheezing or shortness of breath. 1 Inhaler 2  . Blood Glucose Monitoring Suppl (TRUE METRIX METER) w/Device KIT Check blood sugar fasting and ant bedtime and record 1 kit 0  . colchicine 0.6 MG tablet Take 2 tablets (1.2 mg) orally at the onset of a gout attack; may repeat 1 tablet (0.6 mg) in 2 hours if symptoms persist 60 tablet 2  . glucose blood (TRUE METRIX BLOOD GLUCOSE TEST) test strip Use as instructed 100 each 12  . TRUEPLUS LANCETS 28G MISC Check blood sugar fasting  and at bedtime 100 each 2  . allopurinol (ZYLOPRIM) 300 MG tablet Take 1 tablet (300 mg total) by mouth daily. For prevention of gout 30 tablet 6  . amLODipine (NORVASC) 10 MG tablet Take 1 tablet (10 mg total) by mouth daily. For hypertension 30 tablet 6  . atorvastatin (LIPITOR) 40 MG tablet Take 1 tablet (40 mg total) by mouth daily. For hypercholesterolemia 30 tablet 6  . gabapentin (NEURONTIN) 300 MG capsule Take 1 capsule (300 mg total) by mouth 2 (two) times daily. For diabetic neuropathy 60 capsule 6  . glipiZIDE (GLUCOTROL) 10 MG tablet Take 1 tablet (10 mg total) by mouth 2 (two) times daily before a meal. 30 tablet 6  . isosorbide mononitrate (IMDUR) 60 MG 24 hr tablet Take 1 tablet (60 mg total) by mouth daily. 30 tablet 6  . lisinopril-hydrochlorothiazide (ZESTORETIC) 20-12.5 MG tablet Take 2 tablets by mouth daily. 60 tablet 6  . tamsulosin (FLOMAX) 0.4 MG CAPS capsule Take 1 capsule (0.4 mg total) by mouth daily. 30 capsule 6  . HYDROcodone-acetaminophen (NORCO/VICODIN) 5-325 MG tablet Take 1 tablet by mouth every 4 (four) hours as needed. (Patient not taking: Reported on 06/11/2017) 6 tablet 0  . oseltamivir (TAMIFLU) 30 MG capsule Take 1 capsule (30 mg total) by mouth 2 (two) times daily. (Patient not taking: Reported on 06/15/2018) 10 capsule 0   No facility-administered medications prior to visit.    No Known Allergies  ROS Review of Systems  Constitutional: Negative for fatigue, fever and unexpected weight change.  HENT: Negative for congestion, rhinorrhea, sinus pressure and sinus pain.   Eyes: Negative for visual disturbance.  Respiratory: Negative for cough, chest tightness and shortness of breath.   Cardiovascular: Negative for chest pain, palpitations and leg swelling.  Gastrointestinal: Negative for abdominal distention, abdominal pain, constipation, diarrhea, nausea and vomiting.  Endocrine: Negative for polydipsia and polyuria.  Genitourinary: Negative for  decreased urine volume, difficulty urinating and dysuria.  Musculoskeletal: Negative for arthralgias and myalgias.  Skin: Negative for color change and rash.  Neurological: Negative for dizziness, tremors, weakness and numbness.  Hematological: Does not bruise/bleed easily.  Psychiatric/Behavioral: Negative for agitation and behavioral problems.      Objective:    Physical Exam  Constitutional: He is oriented to person, place, and time. He appears well-developed and well-nourished. No distress.  HENT:  Head: Normocephalic and atraumatic.  Eyes: Pupils are equal, round, and reactive to light. Conjunctivae and EOM are normal.  Cardiovascular: Normal rate, regular rhythm, normal heart sounds and intact distal pulses.  No murmur heard. Pulmonary/Chest: Effort normal and breath sounds normal. No respiratory distress. He has no wheezes.  Abdominal: Soft. Bowel sounds are  normal. He exhibits no distension. There is no abdominal tenderness.  Musculoskeletal:        General: No edema. Normal range of motion.     Cervical back: Normal range of motion and neck supple.  Neurological: He is alert and oriented to person, place, and time. A sensory deficit is present.  See foot exam  Skin: Skin is warm and dry. No rash noted. No erythema.  Psychiatric: He has a normal mood and affect. His behavior is normal.  Nursing note and vitals reviewed.   BP (!) 153/92   Pulse 87   Ht 6' 1"  (1.854 m)   Wt 251 lb (113.9 kg)   SpO2 99%   BMI 33.12 kg/m    Health Maintenance Due  Topic Date Due  . OPHTHALMOLOGY EXAM  09/21/1973  . COLON CANCER SCREENING ANNUAL FOBT  09/21/2013  . FOOT EXAM  03/11/2019    There are no preventive care reminders to display for this patient.  No results found for: TSH Lab Results  Component Value Date   WBC 10.8 (H) 05/19/2018   HGB 14.1 05/19/2018   HCT 44.1 05/19/2018   MCV 90.2 05/19/2018   PLT 208 05/19/2018   Lab Results  Component Value Date   NA 137  01/18/2019   K 5.0 01/18/2019   CO2 23 01/18/2019   GLUCOSE 201 (H) 01/18/2019   BUN 35 (H) 01/18/2019   CREATININE 2.23 (H) 01/18/2019   BILITOT 0.4 10/15/2018   ALKPHOS 129 (H) 10/15/2018   AST 52 (H) 10/15/2018   ALT 55 (H) 10/15/2018   PROT 7.3 10/15/2018   ALBUMIN 4.2 10/15/2018   CALCIUM 9.6 01/18/2019   ANIONGAP 11 05/19/2018   Lab Results  Component Value Date   CHOL 207 (H) 09/14/2018   Lab Results  Component Value Date   HDL 32 (L) 09/14/2018   Lab Results  Component Value Date   LDLCALC 112 (H) 09/14/2018   Lab Results  Component Value Date   TRIG 317 (H) 09/14/2018   Lab Results  Component Value Date   CHOLHDL 6.5 (H) 09/14/2018   Lab Results  Component Value Date   HGBA1C 8.7 (A) 04/27/2019      Assessment & Plan:   1. Type 2 diabetes mellitus with stage 3b chronic kidney disease, without long-term current use of insulin (HCC) Uncontrolled with A1c of 8.7 Emphasized medication compliance No medication regimen change due to not taking glipizide as prescribed Provided information regarding financial assistance Counseled on Diabetic diet, my plate method, 481 minutes of moderate intensity exercise/week Keep blood sugar logs with fasting goals of 80-120 mg/dl, random of less than 180 and in the event of sugars less than 60 mg/dl or greater than 400 mg/dl please notify the clinic ASAP. It is recommended that you undergo annual eye exams. Foot exam completed today - POCT glucose (manual entry) - POCT glycosylated hemoglobin (Hb A1C) - CMP14+EGFR - Lipid panel - Microalbumin / creatinine urine ratio - atorvastatin (LIPITOR) 40 MG tablet; Take 1 tablet (40 mg total) by mouth daily. For hypercholesterolemia  Dispense: 30 tablet; Refill: 6 - glipiZIDE (GLUCOTROL) 10 MG tablet; Take 1 tablet (10 mg total) by mouth 2 (two) times daily before a meal.  Dispense: 30 tablet; Refill: 6  2. Idiopathic chronic gout without tophus, unspecified site Stable Reduce  allopurinol dose from 300 mg to 200 mg due lack of recent gout flares and CKD. Avoid foods high in purines - allopurinol (ZYLOPRIM) 100 MG tablet; Take  2 tablets (200 mg total) by mouth daily. For prevention of gout  Dispense: 60 tablet; Refill: 6  3. Accelerated hypertension Uncontrolled Increased isosorbide mononitrate from 60 mg to 120 mg daily Counseled on blood pressure goal of less than 130/80, low-sodium, DASH diet, medication compliance, 150 minutes of moderate intensity exercise per week. Discussed medication compliance, adverse effects. - amLODipine (NORVASC) 10 MG tablet; Take 1 tablet (10 mg total) by mouth daily. For hypertension  Dispense: 30 tablet; Refill: 6 - isosorbide mononitrate (IMDUR) 60 MG 24 hr tablet; Take 2 tablets (120 mg total) by mouth daily.  Dispense: 60 tablet; Refill: 6 - lisinopril-hydrochlorothiazide (ZESTORETIC) 20-12.5 MG tablet; Take 2 tablets by mouth daily.  Dispense: 60 tablet; Refill: 6  4. Other diabetic neurological complication associated with type 2 diabetes mellitus ([place]) Stable See #1 - gabapentin (NEURONTIN) 300 MG capsule; Take 1 capsule (300 mg total) by mouth 2 (two) times daily. For diabetic neuropathy  Dispense: 60 capsule; Refill: 6  5. Lower urinary tract symptoms Stable Avoid nephrotoxic medications Encouraged hydration - tamsulosin (FLOMAX) 0.4 MG CAPS capsule; Take 1 capsule (0.4 mg total) by mouth daily.  Dispense: 30 capsule; Refill: 6   Meds ordered this encounter  Medications  . allopurinol (ZYLOPRIM) 100 MG tablet    Sig: Take 2 tablets (200 mg total) by mouth daily. For prevention of gout    Dispense:  60 tablet    Refill:  6  . amLODipine (NORVASC) 10 MG tablet    Sig: Take 1 tablet (10 mg total) by mouth daily. For hypertension    Dispense:  30 tablet    Refill:  6  . atorvastatin (LIPITOR) 40 MG tablet    Sig: Take 1 tablet (40 mg total) by mouth daily. For hypercholesterolemia    Dispense:  30 tablet    Refill:   6    Discontinue previous dose  . gabapentin (NEURONTIN) 300 MG capsule    Sig: Take 1 capsule (300 mg total) by mouth 2 (two) times daily. For diabetic neuropathy    Dispense:  60 capsule    Refill:  6  . glipiZIDE (GLUCOTROL) 10 MG tablet    Sig: Take 1 tablet (10 mg total) by mouth 2 (two) times daily before a meal.    Dispense:  30 tablet    Refill:  6    Dose increase  . isosorbide mononitrate (IMDUR) 60 MG 24 hr tablet    Sig: Take 2 tablets (120 mg total) by mouth daily.    Dispense:  60 tablet    Refill:  6    Dose increase  . lisinopril-hydrochlorothiazide (ZESTORETIC) 20-12.5 MG tablet    Sig: Take 2 tablets by mouth daily.    Dispense:  60 tablet    Refill:  6  . tamsulosin (FLOMAX) 0.4 MG CAPS capsule    Sig: Take 1 capsule (0.4 mg total) by mouth daily.    Dispense:  30 capsule    Refill:  6    Follow-up: No follow-ups on file.    Tomasita Morrow, RN

## 2019-04-28 LAB — CMP14+EGFR
ALT: 40 IU/L (ref 0–44)
AST: 39 IU/L (ref 0–40)
Albumin/Globulin Ratio: 1.2 (ref 1.2–2.2)
Albumin: 3.6 g/dL — ABNORMAL LOW (ref 3.8–4.9)
Alkaline Phosphatase: 120 IU/L — ABNORMAL HIGH (ref 39–117)
BUN/Creatinine Ratio: 11 (ref 9–20)
BUN: 31 mg/dL — ABNORMAL HIGH (ref 6–24)
Bilirubin Total: 1 mg/dL (ref 0.0–1.2)
CO2: 24 mmol/L (ref 20–29)
Calcium: 9 mg/dL (ref 8.7–10.2)
Chloride: 100 mmol/L (ref 96–106)
Creatinine, Ser: 2.8 mg/dL — ABNORMAL HIGH (ref 0.76–1.27)
GFR calc Af Amer: 28 mL/min/{1.73_m2} — ABNORMAL LOW (ref >59)
GFR calc non Af Amer: 24 mL/min/{1.73_m2} — ABNORMAL LOW (ref >59)
Globulin, Total: 3 g/dL (ref 1.5–4.5)
Glucose: 170 mg/dL — ABNORMAL HIGH (ref 65–99)
Potassium: 5 mmol/L (ref 3.5–5.2)
Sodium: 138 mmol/L (ref 134–144)
Total Protein: 6.6 g/dL (ref 6.0–8.5)

## 2019-04-28 LAB — MICROALBUMIN / CREATININE URINE RATIO
Creatinine, Urine: 113.9 mg/dL
Microalb/Creat Ratio: 497 mg/g creat — ABNORMAL HIGH (ref 0–29)
Microalbumin, Urine: 565.6 ug/mL

## 2019-04-28 LAB — LIPID PANEL
Chol/HDL Ratio: 5.6 ratio — ABNORMAL HIGH (ref 0.0–5.0)
Cholesterol, Total: 167 mg/dL (ref 100–199)
HDL: 30 mg/dL — ABNORMAL LOW (ref >39)
LDL Chol Calc (NIH): 98 mg/dL (ref 0–99)
Triglycerides: 226 mg/dL — ABNORMAL HIGH (ref 0–149)
VLDL Cholesterol Cal: 39 mg/dL (ref 5–40)

## 2019-04-29 ENCOUNTER — Telehealth: Payer: Self-pay

## 2019-04-29 NOTE — Telephone Encounter (Signed)
Patient name and DOB has been verified Patient was informed of lab results. Patient had no questions.   Patient has been contacted by the [facility] and was informed that he has to pay money up front for the appointment, patient states that he does not have the money at this time.

## 2019-04-29 NOTE — Telephone Encounter (Signed)
-----   Message from Charlott Rakes, MD sent at 04/28/2019 11:48 AM EST ----- Cholesterol has improved compared to previous labs.  Kidney function is still abnormal.  Can you please have Alinda Sierras follow-up on his [facility] referral?  Thank you

## 2019-07-27 ENCOUNTER — Ambulatory Visit: Payer: Self-pay | Attending: Family Medicine | Admitting: Family Medicine

## 2019-07-27 ENCOUNTER — Other Ambulatory Visit: Payer: Self-pay

## 2019-07-27 ENCOUNTER — Encounter: Payer: Self-pay | Admitting: Family Medicine

## 2019-07-27 VITALS — BP 165/94 | HR 81 | Ht 73.0 in | Wt 258.0 lb

## 2019-07-27 DIAGNOSIS — I10 Essential (primary) hypertension: Secondary | ICD-10-CM

## 2019-07-27 DIAGNOSIS — E1121 Type 2 diabetes mellitus with diabetic nephropathy: Secondary | ICD-10-CM

## 2019-07-27 DIAGNOSIS — N1832 Chronic kidney disease, stage 3b: Secondary | ICD-10-CM

## 2019-07-27 DIAGNOSIS — E7800 Pure hypercholesterolemia, unspecified: Secondary | ICD-10-CM

## 2019-07-27 DIAGNOSIS — E669 Obesity, unspecified: Secondary | ICD-10-CM

## 2019-07-27 LAB — GLUCOSE, POCT (MANUAL RESULT ENTRY): POC Glucose: 137 mg/dl — AB (ref 70–99)

## 2019-07-27 MED ORDER — HYDRALAZINE HCL 25 MG PO TABS: 25.0000 mg | ORAL_TABLET | Freq: Two times a day (BID) | ORAL | 3 refills | Status: DC

## 2019-07-27 MED FILL — hydrALAZINE HCL 25 MG TABS: 25 | 30 days supply | Qty: 60 | Fill #0

## 2019-07-27 NOTE — Progress Notes (Signed)
Subjective:  Patient ID: Craig West, male    DOB: 1965-06-18  Age: 54 y.o. MRN: [id number]  CC: Diabetes   HPI Craig West is a 54 year old male with a history of hypertension, type 2 diabetes mellitus (A1c 8.7), asthma, gout, stage IV chronic kidney disease, non compliance here for follow-upof Hypertension  Blood sugars run around 125 fasting; he has no hypoglycemia. He has neuropathy in his hands and feet.  He has no visual concerns and is not up-to-date on annual eye exam. Referred to Nephrology for his chronic kidney disease but states he was unable to see them as he has no insurance and could not afford payment requested. His blood pressure is elevated and he endorses taking his antihypertensives today. Asthma and gout have been stable. He has no acute concerns today.  Past Medical History:  Diagnosis Date  . Asthma   . Diabetes mellitus without complication ([place])   . Gout   . Hypertension     No past surgical history on file.  No family history on file.  No Known Allergies  Outpatient Medications Prior to Visit  Medication Sig Dispense Refill  . albuterol (PROVENTIL HFA;VENTOLIN HFA) 108 (90 Base) MCG/ACT inhaler Inhale 2 puffs into the lungs every 6 (six) hours as needed for wheezing or shortness of breath. 1 Inhaler 2  . allopurinol (ZYLOPRIM) 100 MG tablet Take 2 tablets (200 mg total) by mouth daily. For prevention of gout 60 tablet 6  . amLODipine (NORVASC) 10 MG tablet Take 1 tablet (10 mg total) by mouth daily. For hypertension 30 tablet 6  . atorvastatin (LIPITOR) 40 MG tablet Take 1 tablet (40 mg total) by mouth daily. For hypercholesterolemia 30 tablet 6  . Blood Glucose Monitoring Suppl (TRUE METRIX METER) w/Device KIT Check blood sugar fasting and ant bedtime and record 1 kit 0  . colchicine 0.6 MG tablet Take 2 tablets (1.2 mg) orally at the onset of a gout attack; may repeat 1 tablet (0.6 mg) in 2 hours if symptoms persist 60 tablet 2  . gabapentin  (NEURONTIN) 300 MG capsule Take 1 capsule (300 mg total) by mouth 2 (two) times daily. For diabetic neuropathy 60 capsule 6  . glipiZIDE (GLUCOTROL) 10 MG tablet Take 1 tablet (10 mg total) by mouth 2 (two) times daily before a meal. 30 tablet 6  . glucose blood (TRUE METRIX BLOOD GLUCOSE TEST) test strip Use as instructed 100 each 12  . isosorbide mononitrate (IMDUR) 60 MG 24 hr tablet Take 2 tablets (120 mg total) by mouth daily. 60 tablet 6  . lisinopril-hydrochlorothiazide (ZESTORETIC) 20-12.5 MG tablet Take 2 tablets by mouth daily. 60 tablet 6  . tamsulosin (FLOMAX) 0.4 MG CAPS capsule Take 1 capsule (0.4 mg total) by mouth daily. 30 capsule 6  . TRUEPLUS LANCETS 28G MISC Check blood sugar fasting and at bedtime 100 each 2  . HYDROcodone-acetaminophen (NORCO/VICODIN) 5-325 MG tablet Take 1 tablet by mouth every 4 (four) hours as needed. (Patient not taking: Reported on 06/11/2017) 6 tablet 0   No facility-administered medications prior to visit.     ROS Review of Systems  Constitutional: Negative for activity change and appetite change.  HENT: Negative for sinus pressure and sore throat.   Eyes: Negative for visual disturbance.  Respiratory: Negative for cough, chest tightness and shortness of breath.   Cardiovascular: Negative for chest pain and leg swelling.  Gastrointestinal: Negative for abdominal distention, abdominal pain, constipation and diarrhea.  Endocrine: Negative.   Genitourinary: Negative for  dysuria.  Musculoskeletal: Negative for joint swelling and myalgias.  Skin: Negative for rash.  Allergic/Immunologic: Negative.   Neurological: Positive for numbness. Negative for weakness and light-headedness.  Psychiatric/Behavioral: Negative for dysphoric mood and suicidal ideas.    Objective:  BP (!) 165/94   Pulse 81   Ht _0  (1.854 m)   Wt 258 lb (117 kg)   SpO2 96%   BMI 34.04 kg/m   BP/Weight 07/27/2019 04/27/2019 09/32/3557  Systolic BP 322 025 427  Diastolic  BP 94 92 062  Wt. (Lbs) 258 251 265  BMI 34.04 33.12 34.96      Physical Exam Constitutional:      Appearance: He is well-developed. He is obese.  Neck:     Vascular: No JVD.  Cardiovascular:     Rate and Rhythm: Normal rate.     Heart sounds: Normal heart sounds. No murmur.  Pulmonary:     Effort: Pulmonary effort is normal.     Breath sounds: Normal breath sounds. No wheezing or rales.  Chest:     Chest wall: No tenderness.  Abdominal:     General: Bowel sounds are normal. There is no distension.     Palpations: Abdomen is soft. There is no mass.     Tenderness: There is no abdominal tenderness.  Musculoskeletal:        General: Normal range of motion.     Right lower leg: No edema.     Left lower leg: No edema.  Neurological:     Mental Status: He is alert and oriented to person, place, and time.  Psychiatric:        Mood and Affect: Mood normal.     CMP Latest Ref Rng & Units 04/27/2019 01/18/2019 10/15/2018  Glucose 65 - 99 mg/dL 170(H) 201(H) 156(H)  BUN 6 - 24 mg/dL 31(H) 35(H) 33(H)  Creatinine 0.76 - 1.27 mg/dL 2.80(H) 2.23(H) 2.48(H)  Sodium 134 - 144 mmol/L 138 137 139  Potassium 3.5 - 5.2 mmol/L 5.0 5.0 5.5(H)  Chloride 96 - 106 mmol/L 100 100 105  CO2 20 - 29 mmol/L 24 23 16(L)  Calcium 8.7 - 10.2 mg/dL 9.0 9.6 9.3  Total Protein 6.0 - 8.5 g/dL 6.6 - 7.3  Total Bilirubin 0.0 - 1.2 mg/dL 1.0 - 0.4  Alkaline Phos 39 - 117 IU/L 120(H) - 129(H)  AST 0 - 40 IU/L 39 - 52(H)  ALT 0 - 44 IU/L 40 - 55(H)    Lipid Panel     Component Value Date/Time   CHOL 167 04/27/2019 1020   TRIG 226 (H) 04/27/2019 1020   HDL 30 (L) 04/27/2019 1020   CHOLHDL 5.6 (H) 04/27/2019 1020   CHOLHDL 5.2 (H) 10/23/2015 1009   VLDL 46 (H) 10/23/2015 1009   LDLCALC 98 04/27/2019 1020    CBC    Component Value Date/Time   WBC 10.8 (H) 05/19/2018 1659   RBC 4.89 05/19/2018 1659   HGB 14.1 05/19/2018 1659   HGB 14.5 07/11/2016 0949   HCT 44.1 05/19/2018 1659   HCT 43.0  07/11/2016 0949   PLT 208 05/19/2018 1659   PLT 223 07/11/2016 0949   MCV 90.2 05/19/2018 1659   MCV 89 07/11/2016 0949   MCH 28.8 05/19/2018 1659   MCHC 32.0 05/19/2018 1659   RDW 14.1 05/19/2018 1659   RDW 13.8 07/11/2016 0949   LYMPHSABS 0.7 05/19/2018 1659   LYMPHSABS 0.7 07/11/2016 0949   MONOABS 1.0 05/19/2018 1659   EOSABS 0.1 05/19/2018 1659  EOSABS 0.0 07/11/2016 0949   BASOSABS 0.0 05/19/2018 1659   BASOSABS 0.0 07/11/2016 0949    Lab Results  Component Value Date   HGBA1C 8.7 (A) 04/27/2019    Assessment & Plan:   1. Type 2 diabetes mellitus with stage 3b chronic kidney disease, without long-term current use of insulin (HCC) Uncontrolled with A1c of 8.7: Goal is less than seven We will send of A1c and adjust regimen accordingly Counseled on Diabetic diet, my plate method, 676 minutes of moderate intensity exercise/week Blood sugar logs with fasting goals of 80-120 mg/dl, random of less than 180 and in the event of sugars less than 60 mg/dl or greater than 400 mg/dl encouraged to notify the clinic. Advised on the need for annual eye exams, annual foot exams, Pneumonia vaccine. - POCT glucose (manual entry) - Hemoglobin P9J - Basic Metabolic Panel  2. Essential hypertension Uncontrolled Hydralazine added to regimen Continue isosorbide, lisinopril/HCTZ and amlodipine Counseled on blood pressure goal of less than 130/80, low-sodium, DASH diet, medication compliance, 150 minutes of moderate intensity exercise per week. Discussed medication compliance, adverse effects.  3. Obesity (BMI 30.0-34.9) Counseled on reducing portion sizes, increasing physical activity  4. Pure hypercholesterolemia Well-controlled Currently on Lipitor 40 mg Lipid panel at next visit when he is fasting  Health Care Maintenance: Due for annual eye exam-discussed community resources available given he has no medical coverage for referral to ophthalmology No orders of the defined types  were placed in this encounter.   Return in about 3 months (around 10/26/2019) for Chronic disease management.       Charlott Rakes, MD, FAAFP. [facility] [place], [place]   07/27/2019, 8:54 AM

## 2019-07-27 NOTE — Patient Instructions (Signed)

## 2019-07-28 LAB — BASIC METABOLIC PANEL
BUN/Creatinine Ratio: 12 (ref 9–20)
BUN: 40 mg/dL — ABNORMAL HIGH (ref 6–24)
CO2: 22 mmol/L (ref 20–29)
Calcium: 9.3 mg/dL (ref 8.7–10.2)
Chloride: 103 mmol/L (ref 96–106)
Creatinine, Ser: 3.35 mg/dL — ABNORMAL HIGH (ref 0.76–1.27)
GFR calc Af Amer: 23 mL/min/{1.73_m2} — ABNORMAL LOW (ref >59)
GFR calc non Af Amer: 20 mL/min/{1.73_m2} — ABNORMAL LOW (ref >59)
Glucose: 137 mg/dL — ABNORMAL HIGH (ref 65–99)
Potassium: 5.3 mmol/L — ABNORMAL HIGH (ref 3.5–5.2)
Sodium: 139 mmol/L (ref 134–144)

## 2019-07-28 LAB — HEMOGLOBIN A1C
Est. average glucose Bld gHb Est-mCnc: 189 mg/dL
Hgb A1c MFr Bld: 8.2 % — ABNORMAL HIGH (ref 4.8–5.6)

## 2019-07-30 ENCOUNTER — Telehealth: Payer: Self-pay

## 2019-07-30 NOTE — Telephone Encounter (Signed)
Patient was called and a voicemail was left informing patient to return phone call for lab results. 

## 2019-07-30 NOTE — Telephone Encounter (Signed)
-----   Message from Charlott Rakes, MD sent at 07/28/2019  2:20 PM EDT ----- A1c is 8.2 which is down from 8.7 previously but still elevated as his goal is less than 7.0.  Please encourage compliance with a diabetic diet kidney function, potassium are abnormal and I strongly recommend following up with a kidney specialist as I had referred him previously

## 2019-08-13 ENCOUNTER — Telehealth: Payer: Self-pay

## 2019-08-13 NOTE — Telephone Encounter (Signed)
Patient name and DOB has been verified Patient was informed of lab results. Patient had no questions.  

## 2019-08-13 NOTE — Telephone Encounter (Signed)
-----   Message from Charlott Rakes, MD sent at 07/28/2019  2:20 PM EDT ----- A1c is 8.2 which is down from 8.7 previously but still elevated as his goal is less than 7.0.  Please encourage compliance with a diabetic diet kidney function, potassium are abnormal and I strongly recommend following up with a kidney specialist as I had referred him previously

## 2019-10-26 ENCOUNTER — Ambulatory Visit: Payer: Self-pay | Admitting: Family Medicine

## 2019-12-23 ENCOUNTER — Other Ambulatory Visit: Payer: Self-pay

## 2019-12-23 ENCOUNTER — Encounter (HOSPITAL_COMMUNITY): Payer: Self-pay | Admitting: Emergency Medicine

## 2019-12-23 ENCOUNTER — Inpatient Hospital Stay (HOSPITAL_COMMUNITY)
Admission: EM | Admit: 2019-12-23 | Discharge: 2019-12-27 | DRG: 304 | Disposition: A | Discharge status: Home / Self Care | Payer: Medicaid Other | Attending: Internal Medicine | Admitting: Internal Medicine

## 2019-12-23 ENCOUNTER — Emergency Department (HOSPITAL_COMMUNITY): Payer: Medicaid Other

## 2019-12-23 DIAGNOSIS — I509 Heart failure, unspecified: Secondary | ICD-10-CM

## 2019-12-23 DIAGNOSIS — Z9114 Patient's other noncompliance with medication regimen: Secondary | ICD-10-CM

## 2019-12-23 DIAGNOSIS — J45909 Unspecified asthma, uncomplicated: Secondary | ICD-10-CM | POA: Diagnosis present

## 2019-12-23 DIAGNOSIS — I160 Hypertensive urgency: Secondary | ICD-10-CM | POA: Diagnosis present

## 2019-12-23 DIAGNOSIS — I248 Other forms of acute ischemic heart disease: Secondary | ICD-10-CM | POA: Diagnosis present

## 2019-12-23 DIAGNOSIS — I159 Secondary hypertension, unspecified: Secondary | ICD-10-CM | POA: Diagnosis present

## 2019-12-23 DIAGNOSIS — M109 Gout, unspecified: Secondary | ICD-10-CM | POA: Diagnosis present

## 2019-12-23 DIAGNOSIS — Z20822 Contact with and (suspected) exposure to covid-19: Secondary | ICD-10-CM | POA: Diagnosis present

## 2019-12-23 DIAGNOSIS — I130 Hypertensive heart and chronic kidney disease with heart failure and stage 1 through stage 4 chronic kidney disease, or unspecified chronic kidney disease: Secondary | ICD-10-CM | POA: Diagnosis present

## 2019-12-23 DIAGNOSIS — M1A00X0 Idiopathic chronic gout, unspecified site, without tophus (tophi): Secondary | ICD-10-CM

## 2019-12-23 DIAGNOSIS — Z79899 Other long term (current) drug therapy: Secondary | ICD-10-CM | POA: Diagnosis not present

## 2019-12-23 DIAGNOSIS — I43 Cardiomyopathy in diseases classified elsewhere: Secondary | ICD-10-CM | POA: Diagnosis present

## 2019-12-23 DIAGNOSIS — N184 Chronic kidney disease, stage 4 (severe): Secondary | ICD-10-CM | POA: Diagnosis present

## 2019-12-23 DIAGNOSIS — I161 Hypertensive emergency: Principal | ICD-10-CM | POA: Diagnosis present

## 2019-12-23 DIAGNOSIS — I472 Ventricular tachycardia: Secondary | ICD-10-CM | POA: Diagnosis present

## 2019-12-23 DIAGNOSIS — I10 Essential (primary) hypertension: Secondary | ICD-10-CM

## 2019-12-23 DIAGNOSIS — E1165 Type 2 diabetes mellitus with hyperglycemia: Secondary | ICD-10-CM | POA: Diagnosis present

## 2019-12-23 DIAGNOSIS — E1122 Type 2 diabetes mellitus with diabetic chronic kidney disease: Secondary | ICD-10-CM | POA: Diagnosis present

## 2019-12-23 DIAGNOSIS — R069 Unspecified abnormalities of breathing: Secondary | ICD-10-CM | POA: Diagnosis not present

## 2019-12-23 DIAGNOSIS — E1149 Type 2 diabetes mellitus with other diabetic neurological complication: Secondary | ICD-10-CM

## 2019-12-23 DIAGNOSIS — E119 Type 2 diabetes mellitus without complications: Secondary | ICD-10-CM

## 2019-12-23 DIAGNOSIS — F17210 Nicotine dependence, cigarettes, uncomplicated: Secondary | ICD-10-CM | POA: Diagnosis present

## 2019-12-23 DIAGNOSIS — N179 Acute kidney failure, unspecified: Secondary | ICD-10-CM | POA: Diagnosis present

## 2019-12-23 DIAGNOSIS — I5031 Acute diastolic (congestive) heart failure: Secondary | ICD-10-CM | POA: Diagnosis present

## 2019-12-23 DIAGNOSIS — I2489 Other forms of acute ischemic heart disease: Secondary | ICD-10-CM | POA: Diagnosis present

## 2019-12-23 DIAGNOSIS — Z794 Long term (current) use of insulin: Secondary | ICD-10-CM | POA: Diagnosis not present

## 2019-12-23 DIAGNOSIS — R399 Unspecified symptoms and signs involving the genitourinary system: Secondary | ICD-10-CM

## 2019-12-23 DIAGNOSIS — Z9119 Patient's noncompliance with other medical treatment and regimen: Secondary | ICD-10-CM | POA: Diagnosis not present

## 2019-12-23 HISTORY — DX: Heart failure, unspecified: I50.9

## 2019-12-23 LAB — COMPREHENSIVE METABOLIC PANEL
ALT: 36 U/L (ref 0–44)
AST: 34 U/L (ref 15–41)
Albumin: 3.2 g/dL — ABNORMAL LOW (ref 3.5–5.0)
Alkaline Phosphatase: 145 U/L — ABNORMAL HIGH (ref 38–126)
Anion gap: 11 (ref 5–15)
BUN: 42 mg/dL — ABNORMAL HIGH (ref 6–20)
CO2: 25 mmol/L (ref 22–32)
Calcium: 9 mg/dL (ref 8.9–10.3)
Chloride: 102 mmol/L (ref 98–111)
Creatinine, Ser: 3.54 mg/dL — ABNORMAL HIGH (ref 0.61–1.24)
GFR calc Af Amer: 21 mL/min — ABNORMAL LOW (ref >60)
GFR calc non Af Amer: 18 mL/min — ABNORMAL LOW (ref >60)
Glucose, Bld: 211 mg/dL — ABNORMAL HIGH (ref 70–99)
Potassium: 4.5 mmol/L (ref 3.5–5.1)
Sodium: 138 mmol/L (ref 135–145)
Total Bilirubin: 1 mg/dL (ref 0.3–1.2)
Total Protein: 8.5 g/dL — ABNORMAL HIGH (ref 6.5–8.1)

## 2019-12-23 LAB — CBC WITH DIFFERENTIAL/PLATELET
Abs Immature Granulocytes: 0.06 10*3/uL (ref 0.00–0.07)
Basophils Absolute: 0.1 10*3/uL (ref 0.0–0.1)
Basophils Relative: 1 %
Eosinophils Absolute: 0.3 10*3/uL (ref 0.0–0.5)
Eosinophils Relative: 3 %
HCT: 42.8 % (ref 39.0–52.0)
Hemoglobin: 13.7 g/dL (ref 13.0–17.0)
Immature Granulocytes: 1 %
Lymphocytes Relative: 16 %
Lymphs Abs: 2 10*3/uL (ref 0.7–4.0)
MCH: 29.1 pg (ref 26.0–34.0)
MCHC: 32 g/dL (ref 30.0–36.0)
MCV: 91.1 fL (ref 80.0–100.0)
Monocytes Absolute: 0.8 10*3/uL (ref 0.1–1.0)
Monocytes Relative: 6 %
Neutro Abs: 9.1 10*3/uL — ABNORMAL HIGH (ref 1.7–7.7)
Neutrophils Relative %: 73 %
Platelets: 282 10*3/uL (ref 150–400)
RBC: 4.7 MIL/uL (ref 4.22–5.81)
RDW: 13.9 % (ref 11.5–15.5)
WBC: 12.2 10*3/uL — ABNORMAL HIGH (ref 4.0–10.5)
nRBC: 0 % (ref 0.0–0.2)

## 2019-12-23 LAB — CBG MONITORING, ED: Glucose-Capillary: 152 mg/dL — ABNORMAL HIGH (ref 70–99)

## 2019-12-23 LAB — RESPIRATORY PANEL BY RT PCR (FLU A&B, COVID)
Influenza A by PCR: NEGATIVE
Influenza B by PCR: NEGATIVE
SARS Coronavirus 2 by RT PCR: NEGATIVE

## 2019-12-23 LAB — BRAIN NATRIURETIC PEPTIDE: B Natriuretic Peptide: 1123.1 pg/mL — ABNORMAL HIGH (ref 0.0–100.0)

## 2019-12-23 LAB — TROPONIN I (HIGH SENSITIVITY)
Troponin I (High Sensitivity): 183 ng/L (ref <18)
Troponin I (High Sensitivity): 218 ng/L (ref <18)

## 2019-12-23 MED ORDER — LABETALOL HCL 5 MG/ML IV SOLN
10.0000 mg | Freq: Once | INTRAVENOUS | Status: AC
  Administered 2019-12-23: 10 mg via INTRAVENOUS
  Filled 2019-12-23: qty 4

## 2019-12-23 MED ORDER — ONDANSETRON HCL 4 MG/2ML IJ SOLN: 4.0000 mg | Freq: Four times a day (QID) | INTRAMUSCULAR | Status: DC | PRN

## 2019-12-23 MED ORDER — DOCUSATE SODIUM 100 MG PO CAPS: 100.0000 mg | ORAL_CAPSULE | Freq: Two times a day (BID) | ORAL | Status: DC | PRN

## 2019-12-23 MED ORDER — ALBUTEROL SULFATE HFA 108 (90 BASE) MCG/ACT IN AERS
2.0000 | INHALATION_SPRAY | Freq: Once | RESPIRATORY_TRACT | Status: AC
  Administered 2019-12-23: 2 via RESPIRATORY_TRACT

## 2019-12-23 MED ORDER — INSULIN ASPART 100 UNIT/ML ~~LOC~~ SOLN
0.0000 [IU] | Freq: Three times a day (TID) | SUBCUTANEOUS | Status: DC
  Administered 2019-12-26 – 2019-12-27 (×2): 1 [IU] via SUBCUTANEOUS

## 2019-12-23 MED ORDER — HYDRALAZINE HCL 50 MG PO TABS
50.0000 mg | ORAL_TABLET | Freq: Once | ORAL | Status: AC
  Administered 2019-12-23: 50 mg via ORAL
  Filled 2019-12-23: qty 1

## 2019-12-23 MED ORDER — FUROSEMIDE 10 MG/ML IJ SOLN
80.0000 mg | Freq: Once | INTRAMUSCULAR | Status: AC
  Administered 2019-12-23: 80 mg via INTRAVENOUS
  Filled 2019-12-23: qty 8

## 2019-12-23 MED ORDER — ACETAMINOPHEN 325 MG PO TABS
650.0000 mg | ORAL_TABLET | ORAL | Status: DC | PRN
  Administered 2019-12-24: 650 mg via ORAL
  Filled 2019-12-23: qty 2

## 2019-12-23 MED ORDER — ALBUTEROL SULFATE HFA 108 (90 BASE) MCG/ACT IN AERS
2.0000 | INHALATION_SPRAY | Freq: Once | RESPIRATORY_TRACT | Status: AC
  Administered 2019-12-23: 2 via RESPIRATORY_TRACT
  Filled 2019-12-23: qty 6.7

## 2019-12-23 MED ORDER — ACETAMINOPHEN 325 MG PO TABS
650.0000 mg | ORAL_TABLET | Freq: Once | ORAL | Status: AC
  Administered 2019-12-23: 650 mg via ORAL
  Filled 2019-12-23: qty 2

## 2019-12-23 MED ORDER — NICARDIPINE HCL IN NACL 20-0.86 MG/200ML-% IV SOLN
3.0000 mg/h | INTRAVENOUS | Status: DC
  Administered 2019-12-23 (×2): 5 mg/h via INTRAVENOUS
  Administered 2019-12-24 (×2): 7.5 mg/h via INTRAVENOUS
  Filled 2019-12-23 (×5): qty 200

## 2019-12-23 MED ORDER — POLYETHYLENE GLYCOL 3350 17 G PO PACK: 17.0000 g | PACK | Freq: Every day | ORAL | Status: DC | PRN

## 2019-12-23 MED ORDER — AEROCHAMBER PLUS FLO-VU LARGE MISC: 1.0000 | Freq: Once | Status: DC

## 2019-12-23 MED ORDER — FUROSEMIDE 10 MG/ML IJ SOLN
40.0000 mg | Freq: Once | INTRAMUSCULAR | Status: AC
  Administered 2019-12-23: 40 mg via INTRAVENOUS
  Filled 2019-12-23: qty 4

## 2019-12-23 MED ORDER — HEPARIN SODIUM (PORCINE) 5000 UNIT/ML IJ SOLN
5000.0000 [IU] | Freq: Three times a day (TID) | INTRAMUSCULAR | Status: DC
  Administered 2019-12-23 – 2019-12-27 (×10): 5000 [IU] via SUBCUTANEOUS
  Filled 2019-12-23 (×11): qty 1

## 2019-12-23 NOTE — ED Provider Notes (Signed)
[facility] EMERGENCY DEPARTMENT Provider Note   CSN: [id number] Arrival date & time: 12/23/19  0448     History Chief Complaint  Patient presents with  . Asthma    Hypertensive    Craig West is a 54 y.o. male with a history of hypertension, DM, asthma, & CKD who presents to the ED with complaints of dyspnea for the past several days. Patient states he feels fairly constantly short of breath, worse with activity and in the middle of the night. He is unable to sleep flat, currently sleeping upright, & often wakes in the middle of the night short of breath. Has had associated cough intermittently productive and thinks he has been wheezing. He denies fever, chills, chest pain, abdominal pain, N/V, diaphoresis, or syncope. He also mentions that he thinks he is having a gout flare in his left ankle- hx of same, feels similar. Denies color change, open wounds, hemoptysis, recent surgery/trauma, recent long travel, hormone use, personal hx of cancer, or hx of DVT/PE.   He has been out of all of his medicines x 1 month.  He has received both doses of the COVID vaccine.    HPI     Past Medical History:  Diagnosis Date  . Asthma   . Diabetes mellitus without complication ([place])   . Gout   . Hypertension     Patient Active Problem List   Diagnosis Date Noted  . Obesity (BMI 30.0-34.9) 12/09/2017  . Non compliance w medication regimen 04/18/2017  . Gout 08/02/2016  . Diabetic neuropathy ([place]) 08/02/2016  . Chronic kidney disease 08/02/2016  . Hyperlipidemia 10/24/2015  . Asthma 10/16/2015  . Type 2 diabetes mellitus without complication ([place]) 96/78/9381  . Essential hypertension 06/13/2014  . Smoker 06/13/2014    History reviewed. No pertinent surgical history.     No family history on file.  Social History   Tobacco Use  . Smoking status: Current Every Day Smoker    Packs/day: 0.50    Types: Cigarettes  . Smokeless tobacco: Never Used  . Tobacco  comment: Pt states he started smoking again 7 years ago  Vaping Use  . Vaping Use: Never used  Substance Use Topics  . Alcohol use: Yes    Alcohol/week: 0.0 standard drinks    Comment: occ  . Drug use: No    Home Medications Prior to Admission medications   Medication Sig Start Date End Date Taking? Authorizing Provider  albuterol (PROVENTIL HFA;VENTOLIN HFA) 108 (90 Base) MCG/ACT inhaler Inhale 2 puffs into the lungs every 6 (six) hours as needed for wheezing or shortness of breath. 05/15/17   Charlott Rakes, MD  allopurinol (ZYLOPRIM) 100 MG tablet Take 2 tablets (200 mg total) by mouth daily. For prevention of gout 04/27/19   Charlott Rakes, MD  amLODipine (NORVASC) 10 MG tablet Take 1 tablet (10 mg total) by mouth daily. For hypertension 04/27/19   Charlott Rakes, MD  atorvastatin (LIPITOR) 40 MG tablet Take 1 tablet (40 mg total) by mouth daily. For hypercholesterolemia 04/27/19   Charlott Rakes, MD  Blood Glucose Monitoring Suppl (TRUE METRIX METER) w/Device KIT Check blood sugar fasting and ant bedtime and record 05/15/17   Charlott Rakes, MD  colchicine 0.6 MG tablet Take 2 tablets (1.2 mg) orally at the onset of a gout attack; may repeat 1 tablet (0.6 mg) in 2 hours if symptoms persist 06/26/18   Charlott Rakes, MD  gabapentin (NEURONTIN) 300 MG capsule Take 1 capsule (300 mg total) by  mouth 2 (two) times daily. For diabetic neuropathy 04/27/19   Charlott Rakes, MD  glipiZIDE (GLUCOTROL) 10 MG tablet Take 1 tablet (10 mg total) by mouth 2 (two) times daily before a meal. 04/27/19   Charlott Rakes, MD  glucose blood (TRUE METRIX BLOOD GLUCOSE TEST) test strip Use as instructed 05/15/17   Charlott Rakes, MD  hydrALAZINE (APRESOLINE) 25 MG tablet Take 1 tablet (25 mg total) by mouth 2 (two) times daily. 07/27/19   Charlott Rakes, MD  HYDROcodone-acetaminophen (NORCO/VICODIN) 5-325 MG tablet Take 1 tablet by mouth every 4 (four) hours as needed. Patient not taking: Reported on 06/11/2017  05/25/17   Lorin Glass, PA-C  isosorbide mononitrate (IMDUR) 60 MG 24 hr tablet Take 2 tablets (120 mg total) by mouth daily. 04/27/19   Charlott Rakes, MD  lisinopril-hydrochlorothiazide (ZESTORETIC) 20-12.5 MG tablet Take 2 tablets by mouth daily. 04/27/19   Charlott Rakes, MD  tamsulosin (FLOMAX) 0.4 MG CAPS capsule Take 1 capsule (0.4 mg total) by mouth daily. 04/27/19   Charlott Rakes, MD  TRUEPLUS LANCETS 28G MISC Check blood sugar fasting and at bedtime 05/15/17   Charlott Rakes, MD    Allergies    Patient has no known allergies.  Review of Systems   Review of Systems  Constitutional: Negative for chills, diaphoresis and fever.  Respiratory: Positive for cough, shortness of breath and wheezing.   Cardiovascular: Negative for chest pain and leg swelling.  Gastrointestinal: Negative for abdominal pain, nausea and vomiting.  Genitourinary: Negative for dysuria.  Musculoskeletal: Positive for arthralgias and joint swelling.  Neurological: Negative for syncope.  All other systems reviewed and are negative.   Physical Exam Updated Vital Signs BP (!) 208/137 (BP Location: Left Arm)   Pulse 92   Temp 97.6 F (36.4 C) (Oral)   Resp 17   Ht 6' 1"  (1.854 m)   Wt 115 kg   SpO2 96%   BMI 33.45 kg/m   Physical Exam Vitals and nursing note reviewed.  Constitutional:      General: He is not in acute distress.    Appearance: He is well-developed. He is not toxic-appearing.  HENT:     Head: Normocephalic and atraumatic.  Eyes:     General:        Right eye: No discharge.        Left eye: No discharge.     Extraocular Movements: Extraocular movements intact.     Conjunctiva/sclera: Conjunctivae normal.     Pupils: Pupils are equal, round, and reactive to light.  Cardiovascular:     Rate and Rhythm: Normal rate and regular rhythm.  Pulmonary:     Effort: Tachypnea present. No respiratory distress.     Breath sounds: Rales (bibasilar) present. No wheezing or rhonchi.      Comments: 2+ symmetric DP and PT pulses bilaterally. Abdominal:     General: There is no distension.     Palpations: Abdomen is soft.     Tenderness: There is no abdominal tenderness.  Musculoskeletal:     Cervical back: Neck supple.     Comments: 1-2+ symmetric pitting edema to the bilateral lower legs.  Bilateral ankles do appear swollen, left ankle seems a bit worse.  There is no overlying erythema, warmth, or open wounds.  His left ankle has minimal tenderness to palpation.  Otherwise nontender.  Compartments are soft.  Skin:    General: Skin is warm and dry.     Findings: No rash.  Neurological:  Mental Status: He is alert.     Comments: Clear speech.   Psychiatric:        Behavior: Behavior normal.     ED Results / Procedures / Treatments   Labs (all labs ordered are listed, but only abnormal results are displayed) Labs Reviewed  CBC WITH DIFFERENTIAL/PLATELET - Abnormal; Notable for the following components:      Result Value   WBC 12.2 (*)    Neutro Abs 9.1 (*)    All other components within normal limits  COMPREHENSIVE METABOLIC PANEL - Abnormal; Notable for the following components:   Glucose, Bld 211 (*)    BUN 42 (*)    Creatinine, Ser 3.54 (*)    Total Protein 8.5 (*)    Albumin 3.2 (*)    Alkaline Phosphatase 145 (*)    GFR calc non Af Amer 18 (*)    GFR calc Af Amer 21 (*)    All other components within normal limits    EKG EKG Interpretation  Date/Time:  Thursday December 23 2019 08:17:37 EDT Ventricular Rate:  110 PR Interval:    QRS Duration: 121 QT Interval:  365 QTC Calculation: 494 R Axis:   70 Text Interpretation: Sinus tachycardia with irregular rate Probable left atrial enlargement Right bundle branch block Confirmed by Davonna Belling [id number]) on 12/23/2019 2:01:04 PM   Radiology DG Chest 2 View  Result Date: 12/23/2019 CLINICAL DATA:  Asthma, hypertension EXAM: CHEST - 2 VIEW COMPARISON:  05/19/2018 FINDINGS: The lungs are  symmetrically expanded. Mild bibasilar atelectasis. Superimposed perihilar interstitial pulmonary edema, possibly cardiogenic in nature. No pneumothorax. Small bilateral pleural effusions. Cardiac size within normal limits. No acute bone abnormality. IMPRESSION: Mild interstitial pulmonary edema and small bilateral pleural effusions, possibly cardiogenic in nature. Electronically Signed   By: Fidela Salisbury MD   On: 12/23/2019 05:29    Procedures .Critical Care Performed by: Amaryllis Dyke, PA-C Authorized by: Amaryllis Dyke, PA-C    CRITICAL CARE Performed by: Kennith Maes   Total critical care time: 30 minutes  Critical care time was exclusive of separately billable procedures and treating other patients.  Critical care was necessary to treat or prevent imminent or life-threatening deterioration.  Critical care was time spent personally by me on the following activities: development of treatment plan with patient and/or surrogate as well as nursing, discussions with consultants, evaluation of patient's response to treatment, examination of patient, obtaining history from patient or surrogate, ordering and performing treatments and interventions, ordering and review of laboratory studies, ordering and review of radiographic studies, pulse oximetry and re-evaluation of patient's condition.    (including critical care time)  Medications Ordered in ED Medications  albuterol (VENTOLIN HFA) 108 (90 Base) MCG/ACT inhaler 2 puff (2 puffs Inhalation Given 12/23/19 0458)  hydrALAZINE (APRESOLINE) tablet 50 mg (50 mg Oral Given 12/23/19 0515)    ED Course  I have reviewed the triage vital signs and the nursing notes.  Pertinent labs & imaging results that were available during my care of the patient were reviewed by me and considered in my medical decision making (see chart for details).    MDM Rules/Calculators/A&P                          Patient presents to the  ED with complaints of shortness of breath over the past several days.  On arrival he is nontoxic, resting comfortably, his blood pressure is notably quite elevated, he received  some hydralazine in the waiting room with some improvement.  He is mildly tachypneic on my exam, but not in respiratory distress.  He has bibasilar Rales as well as symmetric pitting edema to the lower legs.   Ddx: Acute CHF, asthma exacerbation, pneumonia, pulmonary embolism, critical anemia, COVID-19.  In terms of complaints of gout flare in the left ankle, has a history of same, currently not taking his allopurinol-exam is not consistent with septic joint.  No recent injury/trauma to raise concern for fracture or dislocation.  Neurovascularly intact distally.  Additional history obtained:  Additional history obtained from chart review and nursing note reviewed.. EKG: no STEMI Lab Tests:  I reviewed and interpreted labs ordered by triage, which included:  CBC: Leukocytosis felt to be nonspecific.  No anemia. CMP: Mildly worsening renal function with CKD.  No significant electrolyte derangement.  Hyperglycemia at 211.  Additionally ordered a BNP, troponin, and Covid test.  Imaging Studies ordered:  CXR ordered per triage protocol, I independently visualized and interpreted imaging which showed Mild interstitial pulmonary edema and small bilateral pleural effusions, possibly cardiogenic in nature  History and physical exam most consistent with new onset CHF.  Will administer Lasix for diuresis & 10 mg of IV labetalol for BP control per advisement of Dr. Alvino Chapel. Plan for admission.   BNP: Significantly elevated Troponin: Elevated, suspect due to demand.   BP initially improving to 221T systolic S/p oral hydralazine in the waiting room, however remains in 981S systolic on re-assessment S/p labetalol. Ultimately discussed with internal medicine service & critical care- plan for admission to critical care service for  hypertensive emergency- cardene drip started per critical care.   Findings and plan of care discussed with supervising physician Dr. Alvino Chapel who is in agreement.   Portions of this note were generated with Lobby[employer]. Dictation errors may occur despite best attempts at proofreading.  Final Clinical Impression(s) / ED Diagnoses Final diagnoses:  Acute congestive heart failure, unspecified heart failure type [facility])    Rx / DC Orders ED Discharge Orders    None       Amaryllis Dyke, PA-C 12/23/19 1413    Davonna Belling, MD 12/23/19 1511

## 2019-12-23 NOTE — ED Notes (Signed)
Patient up walking to bathroom gait steady

## 2019-12-23 NOTE — ED Provider Notes (Signed)
Patient at triage for asthma, noted to have very high blood pressure.  He told triage nurse that he had not been taking his medications.  He will be given a dose of hydralazine to try to control his blood pressure while waiting for him to get back to the main ED.   Delora Fuel, MD 28/20/81 [phone]

## 2019-12-23 NOTE — Progress Notes (Signed)
eLink Physician-Brief Progress Note Patient Name: Craig West DOB: [birth year] MRN: [id number]   Date of Service  12/23/2019  HPI/Events of Note  Patient came in with a hypertensive urgency and pulmonary edema which has markedly improved, BP 145/ 85, patient is hungry, and asking for analgesic for general aches and pains related to lying on the ED stretcher.  eICU Interventions  Diabetic / renal diet with fluid restrictions ordered, Tylenol ordered prn pain.        Kerry Kass Wynne Rozak 12/23/2019, 11:01 PM

## 2019-12-23 NOTE — H&P (Signed)
NAME:  Craig West, MRN:  [id number], DOB:  June 25, 1965, LOS: 0 ADMISSION DATE:  12/23/2019, CONSULTATION DATE: 12/23/2019 REFERRING MD: Dr. Alvino Chapel, CHIEF COMPLAINT: Hypertensive urgency  Brief History   54 year old male presents with complaints of shortness of breath and questionable asthma attack.  On arrival seen in hypertensive urgency with underlying CHF physiology.  History of present illness   Craig West is a 54 year old male with a past medical history significant for hypertension, gout, diabetes, and asthma who presented to the emergency department with complaints of wheezing, cough, and questionable asthma attack.  Patient denied any fever, chills, chest pain, nausea vomiting or diarrhea.  On arrival to triage patient was seen severely hypertensive with a blood pressure of 263/163.  Additionally patient was seen mildly tachypneic with respiratory rate in the mid 20s.  All other vital signs within normal limits.  Other pertinent labs include glucose of 211, creatinine 3.54, alkaline phosphatase 145, albumin 3.2, BNP 1123, troponin I 83, and WBC 12.2.  Chest x-ray significant for mild interstitial pulmonary edema and bilateral pleural effusions.  Given hypertensive urgency with underlying flash pulmonary edema and CHF physiology PCCM was consulted for further management admission  Past Medical History  Hypertension Gout Diabetes Asthma  Significant Hospital Events   Admitted 9/23  Consults:    Procedures:  None  Significant Diagnostic Tests:  Chest x-ray 9/23 > mild interstitial edema with bilateral pleural effusions  Micro Data:  COVID 9/23 > negative   Antimicrobials:     Interim history/subjective:  Sitting up on edge of bed stating he cant lie flat due to dyspnea  Objective   Blood pressure (!) 191/116, pulse 78, temperature 97.6 F (36.4 C), temperature source Oral, resp. rate (!) 23, height 6' 1"  (1.854 m), weight 115 kg, SpO2 98 %.       No intake or  output data in the 24 hours ending 12/23/19 1307 Filed Weights   12/23/19 0452  Weight: 115 kg    Examination: General: Pleasant adult male lying in bed in no acute distress HEENT: [place]/AT, MM pink/moist, PERRL,  Neuro: Alert and oriented x3, non-focal CV: s1s2 regular rate and rhythm, no murmur, rubs, or gallops,  PULM:  Clear to ascultation upper, crackles to posterior base  GI: soft, bowel sounds active in all 4 quadrants, non-tender, non-distended Extremities: warm/dry, bi edema  Skin: no rashes or lesions   Resolved Hospital Problem list     Assessment & Plan:  Hypertensive urgency -Patient presented with blood pressure of 263/163 with minimal improvement after IV push of labetalol and hydralazine - Given hypertensive urgency with underlying flash pulmonary edema and CHF physiology PCCM was consulted for further management admission -Patient reports noncompliance with home antihypertensives prior to admission.  Home medications include amlodipine, hydralazine, Imdur, lisinopril/HCTZ P: Start Cardene drip Admit to ICU Close monitoring of blood pressure When appropriate resume home oral antihypertensives Gentle diuretics as below  Concern for CHF physiology -Chest x-ray significant for pulmonary edema on admission -BNP elevated as well Mild troponinemia P: IV Lasix Obtain echocardiogram Trend troponin Daily weight  Strict intake and output   Diabetes  -Home medications include glipizide P: SSI CBG ACHS  CKD stage 4 -Baseline creatinine 2.80-3.35, creatinine on admission 3.54 P: Follow renal function Monitor urine output Trend Bmet Avoid nephrotoxins, ensure adequate renal perfusion   Best practice:  Diet: Cardiac  Pain/Anxiety/Delirium protocol (if indicated): As needed   VAP protocol (if indicated): N/A DVT prophylaxis: Sub heparin  GI prophylaxis: PPI Glucose  control: SSI Mobility: Up with assistance  Code Status: Full Family Communication: Patient  updated  Disposition: ICU   Labs   CBC: Recent Labs  Lab 12/23/19 0506  WBC 12.2*  NEUTROABS 9.1*  HGB 13.7  HCT 42.8  MCV 91.1  PLT 277    Basic Metabolic Panel: Recent Labs  Lab 12/23/19 0506  NA 138  K 4.5  CL 102  CO2 25  GLUCOSE 211*  BUN 42*  CREATININE 3.54*  CALCIUM 9.0   GFR: Estimated Creatinine Clearance: 30.9 mL/min (A) (by C-G formula based on SCr of 3.54 mg/dL (H)). Recent Labs  Lab 12/23/19 0506  WBC 12.2*    Liver Function Tests: Recent Labs  Lab 12/23/19 0506  AST 34  ALT 36  ALKPHOS 145*  BILITOT 1.0  PROT 8.5*  ALBUMIN 3.2*   No results for input(s): LIPASE, AMYLASE in the last 168 hours. No results for input(s): AMMONIA in the last 168 hours.  ABG    Component Value Date/Time   HCO3 26.5 (H) 06/14/2007 1959   TCO2 30 06/23/2009 2305     Coagulation Profile: No results for input(s): INR, PROTIME in the last 168 hours.  Cardiac Enzymes: No results for input(s): CKTOTAL, CKMB, CKMBINDEX, TROPONINI in the last 168 hours.  HbA1C: HbA1c, POC (controlled diabetic range)  Date/Time Value Ref Range Status  04/27/2019 09:09 AM 8.7 (A) 0.0 - 7.0 % Final  01/18/2019 08:46 AM 8.7 (A) 0.0 - 7.0 % Final   Hgb A1c MFr Bld  Date/Time Value Ref Range Status  07/27/2019 09:16 AM 8.2 (H) 4.8 - 5.6 % Final    Comment:             Prediabetes: 5.7 - 6.4          Diabetes: >6.4          Glycemic control for adults with diabetes: <7.0     CBG: No results for input(s): GLUCAP in the last 168 hours.  Review of Systems: Positive in bold   Gen: Denies fever, chills, weight change, fatigue, night sweats HEENT: Denies blurred vision, double vision, hearing loss, tinnitus, sinus congestion, rhinorrhea, sore throat, neck stiffness, dysphagia PULM: Denies shortness of breath, cough, sputum production, hemoptysis, wheezing CV: Denies chest pain, edema, orthopnea, paroxysmal nocturnal dyspnea, palpitations GI: Denies abdominal pain, nausea,  vomiting, diarrhea, hematochezia, melena, constipation, change in bowel habits GU: Denies dysuria, hematuria, polyuria, oliguria, urethral discharge Endocrine: Denies hot or cold intolerance, polyuria, polyphagia or appetite change Derm: Denies rash, dry skin, scaling or peeling skin change Heme: Denies easy bruising, bleeding, bleeding gums Neuro: Denies headache, numbness, weakness, slurred speech, loss of memory or consciousness  Past Medical History  He,  has a past medical history of Asthma, Diabetes mellitus without complication ([place]), Gout, and Hypertension.   Surgical History   History reviewed. No pertinent surgical history.   Social History   reports that he has been smoking cigarettes. He has been smoking about 0.50 packs per day. He has never used smokeless tobacco. He reports current alcohol use. He reports that he does not use drugs.   Family History   His family history is not on file.   Allergies No Known Allergies   Home Medications  Prior to Admission medications   Medication Sig Start Date End Date Taking? Authorizing Provider  albuterol (PROVENTIL HFA;VENTOLIN HFA) 108 (90 Base) MCG/ACT inhaler Inhale 2 puffs into the lungs every 6 (six) hours as needed for wheezing or shortness of breath. Patient  not taking: Reported on 12/23/2019 05/15/17   Charlott Rakes, MD  allopurinol (ZYLOPRIM) 100 MG tablet Take 2 tablets (200 mg total) by mouth daily. For prevention of gout Patient not taking: Reported on 12/23/2019 04/27/19   Charlott Rakes, MD  amLODipine (NORVASC) 10 MG tablet Take 1 tablet (10 mg total) by mouth daily. For hypertension Patient not taking: Reported on 12/23/2019 04/27/19   Charlott Rakes, MD  atorvastatin (LIPITOR) 40 MG tablet Take 1 tablet (40 mg total) by mouth daily. For hypercholesterolemia Patient not taking: Reported on 12/23/2019 04/27/19   Charlott Rakes, MD  Blood Glucose Monitoring Suppl (TRUE METRIX METER) w/Device KIT Check blood sugar fasting  and ant bedtime and record 05/15/17   Charlott Rakes, MD  colchicine 0.6 MG tablet Take 2 tablets (1.2 mg) orally at the onset of a gout attack; may repeat 1 tablet (0.6 mg) in 2 hours if symptoms persist Patient not taking: Reported on 12/23/2019 06/26/18   Charlott Rakes, MD  gabapentin (NEURONTIN) 300 MG capsule Take 1 capsule (300 mg total) by mouth 2 (two) times daily. For diabetic neuropathy Patient not taking: Reported on 12/23/2019 04/27/19   Charlott Rakes, MD  glipiZIDE (GLUCOTROL) 10 MG tablet Take 1 tablet (10 mg total) by mouth 2 (two) times daily before a meal. Patient not taking: Reported on 12/23/2019 04/27/19   Charlott Rakes, MD  glucose blood (TRUE METRIX BLOOD GLUCOSE TEST) test strip Use as instructed 05/15/17   Charlott Rakes, MD  hydrALAZINE (APRESOLINE) 25 MG tablet Take 1 tablet (25 mg total) by mouth 2 (two) times daily. Patient not taking: Reported on 12/23/2019 07/27/19   Charlott Rakes, MD  HYDROcodone-acetaminophen (NORCO/VICODIN) 5-325 MG tablet Take 1 tablet by mouth every 4 (four) hours as needed. Patient not taking: Reported on 06/11/2017 05/25/17   Lorin Glass, PA-C  isosorbide mononitrate (IMDUR) 60 MG 24 hr tablet Take 2 tablets (120 mg total) by mouth daily. Patient not taking: Reported on 12/23/2019 04/27/19   Charlott Rakes, MD  lisinopril-hydrochlorothiazide (ZESTORETIC) 20-12.5 MG tablet Take 2 tablets by mouth daily. Patient not taking: Reported on 12/23/2019 04/27/19   Charlott Rakes, MD  tamsulosin (FLOMAX) 0.4 MG CAPS capsule Take 1 capsule (0.4 mg total) by mouth daily. Patient not taking: Reported on 12/23/2019 04/27/19   Charlott Rakes, MD  TRUEPLUS LANCETS 28G MISC Check blood sugar fasting and at bedtime 05/15/17   Charlott Rakes, MD     Critical care time:    Performed by: Johnsie Cancel  Total critical care time:  40 minutes  Critical care time was exclusive of separately billable procedures and treating other patients.  Critical care  was necessary to treat or prevent imminent or life-threatening deterioration.  Critical care was time spent personally by me on the following activities: development of treatment plan with patient and/or surrogate as well as nursing, discussions with consultants, evaluation of patient's response to treatment, examination of patient, obtaining history from patient or surrogate, ordering and performing treatments and interventions, ordering and review of laboratory studies, ordering and review of radiographic studies, pulse oximetry and re-evaluation of patient's condition.  Johnsie Cancel, NP-C [facility] Contact / Pager information can be found on Amion  12/23/2019, 1:40 PM

## 2019-12-23 NOTE — ED Notes (Signed)
Pt provided meal tray at this time pt sitting up at bedside in stable condition eating. No acute distress noted at this time.

## 2019-12-23 NOTE — ED Triage Notes (Addendum)
Patient reports asthma attack this morning with wheezing and occasional dry cough , denies fever or chills . Hypertensive at triage , patient stated that he is not taking antihypertensive medications .

## 2019-12-23 NOTE — ED Notes (Signed)
Diet order requested to from hospitalist, no order noted in chart at this time, this RN will page hospitalist again per pt meal request.

## 2019-12-24 ENCOUNTER — Encounter (HOSPITAL_COMMUNITY): Payer: Self-pay | Admitting: Internal Medicine

## 2019-12-24 DIAGNOSIS — I161 Hypertensive emergency: Secondary | ICD-10-CM

## 2019-12-24 LAB — CREATININE, SERUM
Creatinine, Ser: 3.26 mg/dL — ABNORMAL HIGH (ref 0.61–1.24)
GFR calc Af Amer: 23 mL/min — ABNORMAL LOW (ref >60)
GFR calc non Af Amer: 20 mL/min — ABNORMAL LOW (ref >60)

## 2019-12-24 LAB — BASIC METABOLIC PANEL
Anion gap: 11 (ref 5–15)
BUN: 41 mg/dL — ABNORMAL HIGH (ref 6–20)
CO2: 25 mmol/L (ref 22–32)
Calcium: 8.4 mg/dL — ABNORMAL LOW (ref 8.9–10.3)
Chloride: 104 mmol/L (ref 98–111)
Creatinine, Ser: 3.3 mg/dL — ABNORMAL HIGH (ref 0.61–1.24)
GFR calc Af Amer: 23 mL/min — ABNORMAL LOW (ref >60)
GFR calc non Af Amer: 20 mL/min — ABNORMAL LOW (ref >60)
Glucose, Bld: 169 mg/dL — ABNORMAL HIGH (ref 70–99)
Potassium: 3.5 mmol/L (ref 3.5–5.1)
Sodium: 140 mmol/L (ref 135–145)

## 2019-12-24 LAB — GLUCOSE, CAPILLARY
Glucose-Capillary: 112 mg/dL — ABNORMAL HIGH (ref 70–99)
Glucose-Capillary: 107 mg/dL — ABNORMAL HIGH (ref 70–99)

## 2019-12-24 LAB — CBC
HCT: 41.1 % (ref 39.0–52.0)
Hemoglobin: 13.1 g/dL (ref 13.0–17.0)
MCH: 28.2 pg (ref 26.0–34.0)
MCHC: 31.9 g/dL (ref 30.0–36.0)
MCV: 88.4 fL (ref 80.0–100.0)
Platelets: 267 10*3/uL (ref 150–400)
RBC: 4.65 MIL/uL (ref 4.22–5.81)
RDW: 14.1 % (ref 11.5–15.5)
WBC: 14.4 10*3/uL — ABNORMAL HIGH (ref 4.0–10.5)
nRBC: 0 % (ref 0.0–0.2)

## 2019-12-24 LAB — CBG MONITORING, ED
Glucose-Capillary: 157 mg/dL — ABNORMAL HIGH (ref 70–99)
Glucose-Capillary: 178 mg/dL — ABNORMAL HIGH (ref 70–99)

## 2019-12-24 LAB — HEMOGLOBIN A1C
Hgb A1c MFr Bld: 7.6 % — ABNORMAL HIGH (ref 4.8–5.6)
Mean Plasma Glucose: 171.42 mg/dL

## 2019-12-24 LAB — HIV ANTIBODY (ROUTINE TESTING W REFLEX): HIV Screen 4th Generation wRfx: NONREACTIVE

## 2019-12-24 MED ORDER — ISOSORBIDE MONONITRATE ER 60 MG PO TB24
60.0000 mg | ORAL_TABLET | Freq: Every day | ORAL | Status: DC
  Administered 2019-12-24 – 2019-12-27 (×4): 60 mg via ORAL
  Filled 2019-12-24: qty 2
  Filled 2019-12-24 (×3): qty 1

## 2019-12-24 MED ORDER — FUROSEMIDE 10 MG/ML IJ SOLN
40.0000 mg | Freq: Three times a day (TID) | INTRAMUSCULAR | Status: DC
  Administered 2019-12-24 – 2019-12-25 (×4): 40 mg via INTRAVENOUS
  Filled 2019-12-24 (×4): qty 4

## 2019-12-24 MED ORDER — LISINOPRIL 20 MG PO TABS
20.0000 mg | ORAL_TABLET | Freq: Every day | ORAL | Status: DC
  Administered 2019-12-24 – 2019-12-25 (×2): 20 mg via ORAL
  Filled 2019-12-24 (×2): qty 1

## 2019-12-24 MED ORDER — HYDROCHLOROTHIAZIDE 12.5 MG PO CAPS
12.5000 mg | ORAL_CAPSULE | Freq: Every day | ORAL | Status: DC
  Administered 2019-12-24 – 2019-12-25 (×2): 12.5 mg via ORAL
  Filled 2019-12-24 (×2): qty 1

## 2019-12-24 MED ORDER — ALBUTEROL SULFATE HFA 108 (90 BASE) MCG/ACT IN AERS
2.0000 | INHALATION_SPRAY | Freq: Four times a day (QID) | RESPIRATORY_TRACT | Status: DC | PRN
  Filled 2019-12-24: qty 6.7

## 2019-12-24 MED ORDER — AMLODIPINE BESYLATE 10 MG PO TABS
10.0000 mg | ORAL_TABLET | Freq: Every day | ORAL | Status: DC
  Administered 2019-12-24 – 2019-12-27 (×4): 10 mg via ORAL
  Filled 2019-12-24 (×2): qty 1
  Filled 2019-12-24: qty 2
  Filled 2019-12-24: qty 1

## 2019-12-24 NOTE — ED Notes (Signed)
Pt escorted to shower.

## 2019-12-24 NOTE — ED Notes (Signed)
Report attempted 

## 2019-12-24 NOTE — Plan of Care (Signed)
  Problem: Education: Goal: Knowledge of General Education information will improve Description Including pain rating scale, medication(s)/side effects and non-pharmacologic comfort measures Outcome: Progressing   

## 2019-12-24 NOTE — Progress Notes (Signed)
Informed of patient coming off cardene drip for hypertensive emergency and transferring out of ICU. IMTS will take over 9/25 at 7 AM.

## 2019-12-24 NOTE — Progress Notes (Signed)
  ReDS Clip Diuretic Study Pt study # [id number]  Your patient has been enrolled in the ReDS Clip Diuretic Study   Changes to prescribed diuretics recommended:  Start IV lasix 40 mg TID  Provider contacted: Dr. Tacy Learn Recommendation was accepted by provider.   REDS Clip  READING= 56%  CHEST RULER = 34 Clip Station = D   Orthodema score = 3 Signs/Symptoms Score   Mild edema, no orthopnea 0 No congestion  Moderate edema, no orthopnea 1 Low-grade orthodema/congestion  Severe edema OR orthopnea 2   Moderate edema and orthopnea 3 High-grade orthodema/congestion  Severe edema AND orthopnea 4    Kerby Nora, PharmD, BCPS Phone [phone] 12/24/2019       9:51 AM  Please check [email or web address] for unit-specific pharmacist phone numbers

## 2019-12-24 NOTE — Progress Notes (Signed)
NAME:  Craig West, MRN:  [id number], DOB:  [birth year], LOS: 1 ADMISSION DATE:  12/23/2019, CONSULTATION DATE: 12/23/2019 REFERRING MD: Dr. Alvino Chapel, CHIEF COMPLAINT: Hypertensive urgency  Brief History   54 year old male presents with complaints of shortness of breath and questionable asthma attack.  On arrival seen in hypertensive urgency with underlying CHF physiology.  History of present illness   Craig West is a 54 year old male with a past medical history significant for hypertension, gout, diabetes, and asthma who presented to the emergency department with complaints of wheezing, cough, and questionable asthma attack.  Patient denied any fever, chills, chest pain, nausea vomiting or diarrhea.  On arrival to triage patient was seen severely hypertensive with a blood pressure of 263/163.  Additionally patient was seen mildly tachypneic with respiratory rate in the mid 20s.  All other vital signs within normal limits.  Other pertinent labs include glucose of 211, creatinine 3.54, alkaline phosphatase 145, albumin 3.2, BNP 1123, troponin I 83, and WBC 12.2.  Chest x-ray significant for mild interstitial pulmonary edema and bilateral pleural effusions.  Given hypertensive urgency with underlying flash pulmonary edema and CHF physiology PCCM was consulted for further management admission  Past Medical History  Hypertension Gout Diabetes Asthma  Significant Hospital Events   Admitted 9/23  Consults:    Procedures:  None  Significant Diagnostic Tests:  Chest x-ray 9/23 > mild interstitial edema with bilateral pleural effusions  Micro Data:  COVID 9/23 > negative   Antimicrobials:     Interim history/subjective:  Requesting food.  On room air.  On Cardene drip for blood pressure systolic 419  Objective   Blood pressure 136/67, pulse 81, temperature 97.6 F (36.4 C), temperature source Oral, resp. rate 20, height 6\' 1"  (1.854 m), weight 115 kg, SpO2 97 %.       No intake  or output data in the 24 hours ending 12/24/19 0831 Filed Weights   12/23/19 0452  Weight: 115 kg    Examination: General: Well-nourished male no acute distress HEENT: No JVD or lymphadenopathy is appreciated Neuro: Grossly intact but somewhat of a dull affect CV: Heart sounds regular regular rate rhythm blood pressure 145/76 PULM: On room air with O2 sats of 94% GI: soft, bsx4 active  GU: Voids Extremities: warm/dry, 1+ edema  Skin: no rashes or lesions    Resolved Hospital Problem list     Assessment & Plan:  Hypertensive urgency -Patient presented with blood pressure of 263/163 with minimal improvement after IV push of labetalol and hydralazine - Given hypertensive urgency with underlying flash pulmonary edema and CHF physiology PCCM was consulted for further management admission -Patient reports noncompliance with home antihypertensives prior to admission.  Home medications include amlodipine, hydralazine, Imdur, lisinopril/HCTZ P: Transition off Cardene drip utilizing home antihypertensives which he is run out not been taking for period of time Off Cardene drip can go to telemetry floor Resume oral antihypertensives Follow-up chest x-ray 2D echo pending  Concern for CHF physiology -Chest x-ray significant for pulmonary edema on admission -BNP elevated as well Mild troponinemia P: Status post IV Lasix 2D echo ordered Continue to monitor with telemetry Resume Imdur  Diabetes  -Home medications include glipizide P: Sliding-scale insulin protocol  CKD stage 4 -Baseline creatinine 2.80-3.35, creatinine on admission 3.54 Lab Results  Component Value Date   CREATININE 3.26 (H) 12/24/2019   CREATININE 3.54 (H) 12/23/2019   CREATININE 3.35 (H) 07/27/2019   CREATININE 1.18 10/23/2015   CREATININE 1.48 (H) 03/29/2015   CREATININE 1.56 (  H) 09/26/2014    P: Monitor renal function Diuretic oral resume   Best practice:  Diet: Cardiac  Pain/Anxiety/Delirium  protocol (if indicated): As needed   VAP protocol (if indicated): N/A DVT prophylaxis: Sub heparin  GI prophylaxis: PPI Glucose control: SSI Mobility: Up with assistance  Code Status: Full Family Communication: Patient updated 9/24 Disposition: ICU   Labs   CBC: Recent Labs  Lab 12/23/19 0506 12/24/19 0557  WBC 12.2* 14.4*  NEUTROABS 9.1*  --   HGB 13.7 13.1  HCT 42.8 41.1  MCV 91.1 88.4  PLT 282 235    Basic Metabolic Panel: Recent Labs  Lab 12/23/19 0506 12/24/19 0403  NA 138  --   K 4.5  --   CL 102  --   CO2 25  --   GLUCOSE 211*  --   BUN 42*  --   CREATININE 3.54* 3.26*  CALCIUM 9.0  --    GFR: Estimated Creatinine Clearance: 33.6 mL/min (A) (by C-G formula based on SCr of 3.26 mg/dL (H)). Recent Labs  Lab 12/23/19 0506 12/24/19 0557  WBC 12.2* 14.4*    Liver Function Tests: Recent Labs  Lab 12/23/19 0506  AST 34  ALT 36  ALKPHOS 145*  BILITOT 1.0  PROT 8.5*  ALBUMIN 3.2*   No results for input(s): LIPASE, AMYLASE in the last 168 hours. No results for input(s): AMMONIA in the last 168 hours.  ABG    Component Value Date/Time   HCO3 26.5 (H) 06/14/2007 1959   TCO2 30 06/23/2009 2305     Coagulation Profile: No results for input(s): INR, PROTIME in the last 168 hours.  Cardiac Enzymes: No results for input(s): CKTOTAL, CKMB, CKMBINDEX, TROPONINI in the last 168 hours.  HbA1C: HbA1c, POC (controlled diabetic range)  Date/Time Value Ref Range Status  04/27/2019 09:09 AM 8.7 (A) 0.0 - 7.0 % Final  01/18/2019 08:46 AM 8.7 (A) 0.0 - 7.0 % Final   Hgb A1c MFr Bld  Date/Time Value Ref Range Status  12/24/2019 05:57 AM 7.6 (H) 4.8 - 5.6 % Final    Comment:    (NOTE) Pre diabetes:          5.7%-6.4%  Diabetes:              >6.4%  Glycemic control for   <7.0% adults with diabetes   07/27/2019 09:16 AM 8.2 (H) 4.8 - 5.6 % Final    Comment:             Prediabetes: 5.7 - 6.4          Diabetes: >6.4          Glycemic control for  adults with diabetes: <7.0     CBG: Recent Labs  Lab 12/23/19 1723 12/24/19 0013  GLUCAP 152* 157*      Critical care time: 25 min     Richardson Landry Brentton Wardlow ACNP Acute Care Nurse Practitioner [place] Please consult [place] 12/24/2019, 8:31 AM

## 2019-12-25 ENCOUNTER — Inpatient Hospital Stay (HOSPITAL_COMMUNITY): Payer: Medicaid Other

## 2019-12-25 ENCOUNTER — Encounter (HOSPITAL_COMMUNITY): Payer: Self-pay | Admitting: Internal Medicine

## 2019-12-25 DIAGNOSIS — I5031 Acute diastolic (congestive) heart failure: Secondary | ICD-10-CM | POA: Diagnosis present

## 2019-12-25 DIAGNOSIS — N184 Chronic kidney disease, stage 4 (severe): Secondary | ICD-10-CM

## 2019-12-25 DIAGNOSIS — N183 Chronic kidney disease, stage 3 unspecified: Secondary | ICD-10-CM

## 2019-12-25 DIAGNOSIS — Z9114 Patient's other noncompliance with medication regimen: Secondary | ICD-10-CM

## 2019-12-25 DIAGNOSIS — I10 Essential (primary) hypertension: Secondary | ICD-10-CM

## 2019-12-25 DIAGNOSIS — J45909 Unspecified asthma, uncomplicated: Secondary | ICD-10-CM

## 2019-12-25 DIAGNOSIS — M109 Gout, unspecified: Secondary | ICD-10-CM

## 2019-12-25 DIAGNOSIS — R7989 Other specified abnormal findings of blood chemistry: Secondary | ICD-10-CM

## 2019-12-25 DIAGNOSIS — I248 Other forms of acute ischemic heart disease: Secondary | ICD-10-CM | POA: Diagnosis present

## 2019-12-25 DIAGNOSIS — I160 Hypertensive urgency: Secondary | ICD-10-CM

## 2019-12-25 DIAGNOSIS — I509 Heart failure, unspecified: Secondary | ICD-10-CM

## 2019-12-25 DIAGNOSIS — J810 Acute pulmonary edema: Secondary | ICD-10-CM

## 2019-12-25 DIAGNOSIS — E1122 Type 2 diabetes mellitus with diabetic chronic kidney disease: Secondary | ICD-10-CM

## 2019-12-25 DIAGNOSIS — I130 Hypertensive heart and chronic kidney disease with heart failure and stage 1 through stage 4 chronic kidney disease, or unspecified chronic kidney disease: Secondary | ICD-10-CM

## 2019-12-25 DIAGNOSIS — I429 Cardiomyopathy, unspecified: Secondary | ICD-10-CM

## 2019-12-25 DIAGNOSIS — N179 Acute kidney failure, unspecified: Secondary | ICD-10-CM

## 2019-12-25 LAB — CBC WITH DIFFERENTIAL/PLATELET
Abs Immature Granulocytes: 0.06 K/uL (ref 0.00–0.07)
Basophils Absolute: 0.1 K/uL (ref 0.0–0.1)
Basophils Relative: 1 %
Eosinophils Absolute: 0.3 K/uL (ref 0.0–0.5)
Eosinophils Relative: 3 %
HCT: 36 % — ABNORMAL LOW (ref 39.0–52.0)
Hemoglobin: 11.3 g/dL — ABNORMAL LOW (ref 13.0–17.0)
Immature Granulocytes: 1 %
Lymphocytes Relative: 23 %
Lymphs Abs: 2.8 K/uL (ref 0.7–4.0)
MCH: 27.9 pg (ref 26.0–34.0)
MCHC: 31.4 g/dL (ref 30.0–36.0)
MCV: 88.9 fL (ref 80.0–100.0)
Monocytes Absolute: 0.7 K/uL (ref 0.1–1.0)
Monocytes Relative: 6 %
Neutro Abs: 8 K/uL — ABNORMAL HIGH (ref 1.7–7.7)
Neutrophils Relative %: 66 %
Platelets: 222 K/uL (ref 150–400)
RBC: 4.05 MIL/uL — ABNORMAL LOW (ref 4.22–5.81)
RDW: 14.1 % (ref 11.5–15.5)
WBC: 12 K/uL — ABNORMAL HIGH (ref 4.0–10.5)
nRBC: 0 % (ref 0.0–0.2)

## 2019-12-25 LAB — GLUCOSE, CAPILLARY
Glucose-Capillary: 108 mg/dL — ABNORMAL HIGH (ref 70–99)
Glucose-Capillary: 97 mg/dL (ref 70–99)
Glucose-Capillary: 110 mg/dL — ABNORMAL HIGH (ref 70–99)
Glucose-Capillary: 190 mg/dL — ABNORMAL HIGH (ref 70–99)

## 2019-12-25 LAB — BASIC METABOLIC PANEL WITH GFR
Anion gap: 11 (ref 5–15)
BUN: 42 mg/dL — ABNORMAL HIGH (ref 6–20)
CO2: 24 mmol/L (ref 22–32)
Calcium: 8.3 mg/dL — ABNORMAL LOW (ref 8.9–10.3)
Chloride: 106 mmol/L (ref 98–111)
Creatinine, Ser: 3.51 mg/dL — ABNORMAL HIGH (ref 0.61–1.24)
GFR calc Af Amer: 21 mL/min — ABNORMAL LOW
GFR calc non Af Amer: 18 mL/min — ABNORMAL LOW
Glucose, Bld: 101 mg/dL — ABNORMAL HIGH (ref 70–99)
Potassium: 3.5 mmol/L (ref 3.5–5.1)
Sodium: 141 mmol/L (ref 135–145)

## 2019-12-25 LAB — MAGNESIUM: Magnesium: 1.8 mg/dL (ref 1.7–2.4)

## 2019-12-25 LAB — ECHOCARDIOGRAM COMPLETE
Area-P 1/2: 3.91 cm2
Calc EF: 46.4 %
Height: 73 in
S' Lateral: 3.3 cm
Single Plane A2C EF: 45.5 %
Single Plane A4C EF: 47.2 %
Weight: 3761.6 oz

## 2019-12-25 LAB — PHOSPHORUS: Phosphorus: 4 mg/dL (ref 2.5–4.6)

## 2019-12-25 MED ORDER — FUROSEMIDE 40 MG PO TABS: 40.0000 mg | ORAL_TABLET | Freq: Two times a day (BID) | ORAL | Status: DC

## 2019-12-25 MED ORDER — CARVEDILOL 12.5 MG PO TABS
12.5000 mg | ORAL_TABLET | Freq: Two times a day (BID) | ORAL | Status: DC
  Administered 2019-12-26 – 2019-12-27 (×2): 12.5 mg via ORAL
  Filled 2019-12-25 (×2): qty 1

## 2019-12-25 MED ORDER — HYDRALAZINE HCL 50 MG PO TABS
50.0000 mg | ORAL_TABLET | Freq: Two times a day (BID) | ORAL | Status: DC
  Administered 2019-12-25 – 2019-12-27 (×4): 50 mg via ORAL
  Filled 2019-12-25 (×4): qty 1

## 2019-12-25 NOTE — Progress Notes (Signed)
  Echocardiogram 2D Echocardiogram has been performed.  Craig West 12/25/2019, 12:14 PM

## 2019-12-25 NOTE — Progress Notes (Addendum)
  ReDS Clip Diuretic Study Pt study # [id number]  Your patient has been enrolled in the ReDS Clip Diuretic Study   Changes to prescribed diuretics recommended:  Continue  IV lasix 40 mg TID  Provider contacted: Dr. Tacy Learn Recommendation was accepted by provider.   REDS Clip  READING= down from 56% to 42% Goal is < 35%  CHEST RULER = 34 Clip Station = D   Orthodema score = 3 Signs/Symptoms Score   Mild edema, no orthopnea 0 No congestion  Moderate edema, no orthopnea 1 Low-grade orthodema/congestion  Severe edema OR orthopnea 2   Moderate edema and orthopnea 3 High-grade orthodema/congestion  Severe edema AND orthopnea 4    Bonnita Nasuti Pharm.D. CPP, BCPS Clinical Pharmacist [phone] 12/26/2019 10:47 AM    Please check [email or web address] for unit-specific pharmacist phone numbers

## 2019-12-25 NOTE — Hospital Course (Addendum)
Ordered: ECHO, EKG  Progress 9/25  Patient states he was admitted for severe SOB with exertion. He states he has had insurance issues covering his medications.   His SOB has significantly improved here in the hospital and he states he is able to lay down almost flat, unlike at home. He states his SOB and orthopnea started acutely about 3-4 days ago and he denies any similar symptoms in the past. He does say that he developed an acute gout flare on admission and continues to endorse LE pain although his LE swelling has improved back to his baseline.   Tyler Aas [phone]; she lives in [place] and he is living with her now   He has a doctor at [facility] (dr. [place]onand?) [facility]. He last saw them 4 months ago and has an appt to see them 01/03/20. He states he would be able to afford $4 at a pharmacy for his medications currently. [facility]. He says he has a car here he could use to pick up his prescriptions.   PE: Respiratory: Lung sounds CTA, bilaterally.   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    He said he had to sleep sitting up due to SOB and was having coughing bouts. He states he had not been able to take his medications because [place].

## 2019-12-25 NOTE — Consult Note (Signed)
Cardiology Consultation:   Patient ID: Craig West MRN: [id number]; DOB: 03/18/1966  Admit date: 12/23/2019 Date of Consult: 12/25/2019  Primary Care Provider: Charlott Rakes, MD [place] Cardiologist: No primary care provider on file.  [facility] Electrophysiologist:  None    Patient Profile:   Craig West is a 54 y.o. male with a hx of hypertension, diabetes, and asthma who is being seen today for the evaluation of hypertensive urgency and diastolic heart failure at the request of Dr Myrtie Hawk.  History of Present Illness:   Craig West reports having hypertension for at least 2 years.  He has struggled to afford his medications due to lack of insurance and lack of income.  He reports he lives home with two friends and they mostly cook at home.  He is not quite aware of what his sodium intake.  He denies caffeine intake.  He has no supplements or herbs but does use NSAIDs for pain control.  He presented to the hospital with subacute onset of shortness of breath and edema.  He reports several nights of orthopnea.  He initially thought that his edema was attributable to gout.  In the ED his blood pressure was 263/163.  He was given labetalol and IV hydralazine.  His home antihypertensives were subsequently resumed (amlodipine, hydrochlorothiazide, and lisinopril).  He was on an IV Karthikeyan drip but this was discontinued.  An echocardiogram was obtained that revealed LVEF 60 to 65% with severe LVH.  Left ventricular wall thickness was 1.8 to 1.9 cm.  Cardiology was consulted due to concern for possible infiltrative cardiomyopathy.  He denies any palpitations and has no family history of sudden cardiac death.   Past Medical History:  Diagnosis Date  . Asthma   . CHF (congestive heart failure) ([place])   . Diabetes mellitus without complication ([place])   . Gout   . Hypertension     Past Surgical History:  Procedure Laterality Date  . NO PAST SURGERIES       Home Medications:    Prior to Admission medications   Medication Sig Start Date End Date Taking? Authorizing Provider  albuterol (PROVENTIL HFA;VENTOLIN HFA) 108 (90 Base) MCG/ACT inhaler Inhale 2 puffs into the lungs every 6 (six) hours as needed for wheezing or shortness of breath. Patient not taking: Reported on 12/23/2019 05/15/17   Charlott Rakes, MD  allopurinol (ZYLOPRIM) 100 MG tablet Take 2 tablets (200 mg total) by mouth daily. For prevention of gout Patient not taking: Reported on 12/23/2019 04/27/19   Charlott Rakes, MD  amLODipine (NORVASC) 10 MG tablet Take 1 tablet (10 mg total) by mouth daily. For hypertension Patient not taking: Reported on 12/23/2019 04/27/19   Charlott Rakes, MD  atorvastatin (LIPITOR) 40 MG tablet Take 1 tablet (40 mg total) by mouth daily. For hypercholesterolemia Patient not taking: Reported on 12/23/2019 04/27/19   Charlott Rakes, MD  Blood Glucose Monitoring Suppl (TRUE METRIX METER) w/Device KIT Check blood sugar fasting and ant bedtime and record 05/15/17   Charlott Rakes, MD  colchicine 0.6 MG tablet Take 2 tablets (1.2 mg) orally at the onset of a gout attack; may repeat 1 tablet (0.6 mg) in 2 hours if symptoms persist Patient not taking: Reported on 12/23/2019 06/26/18   Charlott Rakes, MD  gabapentin (NEURONTIN) 300 MG capsule Take 1 capsule (300 mg total) by mouth 2 (two) times daily. For diabetic neuropathy Patient not taking: Reported on 12/23/2019 04/27/19   Charlott Rakes, MD  glipiZIDE (GLUCOTROL) 10 MG tablet Take 1  tablet (10 mg total) by mouth 2 (two) times daily before a meal. Patient not taking: Reported on 12/23/2019 04/27/19   Charlott Rakes, MD  glucose blood (TRUE METRIX BLOOD GLUCOSE TEST) test strip Use as instructed 05/15/17   Charlott Rakes, MD  hydrALAZINE (APRESOLINE) 25 MG tablet Take 1 tablet (25 mg total) by mouth 2 (two) times daily. Patient not taking: Reported on 12/23/2019 07/27/19   Charlott Rakes, MD  HYDROcodone-acetaminophen (NORCO/VICODIN)  5-325 MG tablet Take 1 tablet by mouth every 4 (four) hours as needed. Patient not taking: Reported on 06/11/2017 05/25/17   Lorin Glass, PA-C  isosorbide mononitrate (IMDUR) 60 MG 24 hr tablet Take 2 tablets (120 mg total) by mouth daily. Patient not taking: Reported on 12/23/2019 04/27/19   Charlott Rakes, MD  lisinopril-hydrochlorothiazide (ZESTORETIC) 20-12.5 MG tablet Take 2 tablets by mouth daily. Patient not taking: Reported on 12/23/2019 04/27/19   Charlott Rakes, MD  tamsulosin (FLOMAX) 0.4 MG CAPS capsule Take 1 capsule (0.4 mg total) by mouth daily. Patient not taking: Reported on 12/23/2019 04/27/19   Charlott Rakes, MD  TRUEPLUS LANCETS 28G MISC Check blood sugar fasting and at bedtime 05/15/17   Charlott Rakes, MD    Inpatient Medications: Scheduled Meds: . AeroChamber Plus Flo-Vu Large  1 each Other Once  . amLODipine  10 mg Oral Daily  . furosemide  40 mg Intravenous Q8H  . heparin  5,000 Units Subcutaneous Q8H  . hydrochlorothiazide  12.5 mg Oral Daily  . insulin aspart  0-9 Units Subcutaneous TID WC  . isosorbide mononitrate  60 mg Oral Daily  . lisinopril  20 mg Oral Daily   Continuous Infusions:  PRN Meds: acetaminophen, albuterol, docusate sodium, ondansetron (ZOFRAN) IV, polyethylene glycol  Allergies:   No Known Allergies  Social History:   Social History   Socioeconomic History  . Marital status: Single    Spouse name: Not on file  . Number of children: Not on file  . Years of education: Not on file  . Highest education level: Not on file  Occupational History  . Not on file  Tobacco Use  . Smoking status: Former Smoker    Packs/day: 0.50    Types: Cigarettes    Quit date: 10/27/2019    Years since quitting: 0.1  . Smokeless tobacco: Never Used  Vaping Use  . Vaping Use: Never used  Substance and Sexual Activity  . Alcohol use: Yes    Alcohol/week: 0.0 standard drinks    Comment: occ  . Drug use: No  . Sexual activity: Not on file   Other Topics Concern  . Not on file  Social History Narrative  . Not on file   Social Determinants of Health   Financial Resource Strain:   . Difficulty of Paying Living Expenses: Not on file  Food Insecurity:   . Worried About Charity fundraiser in the Last Year: Not on file  . Ran Out of Food in the Last Year: Not on file  Transportation Needs:   . Lack of Transportation (Medical): Not on file  . Lack of Transportation (Non-Medical): Not on file  Physical Activity:   . Days of Exercise per Week: Not on file  . Minutes of Exercise per Session: Not on file  Stress:   . Feeling of Stress : Not on file  Social Connections:   . Frequency of Communication with Friends and Family: Not on file  . Frequency of Social Gatherings with Friends and Family: Not on  file  . Attends Religious Services: Not on file  . Active Member of Clubs or Organizations: Not on file  . Attends Archivist Meetings: Not on file  . Marital Status: Not on file  Intimate Partner Violence:   . Fear of Current or Ex-Partner: Not on file  . Emotionally Abused: Not on file  . Physically Abused: Not on file  . Sexually Abused: Not on file    Family History:   History reviewed. No pertinent family history.   ROS:  Please see the history of present illness.   All other ROS reviewed and negative.     Physical Exam/Data:   Vitals:   12/25/19 0058 12/25/19 0400 12/25/19 0730 12/25/19 1300  BP: (!) 145/93 (!) 120/95 (!) 169/107 (!) 147/93  Pulse: 82 69 77 83  Resp: _0 Temp: 98.6 F (37 C) 98.8 F (37.1 C) 98.1 F (36.7 C) 97.9 F (36.6 C)  TempSrc: Oral Oral Oral Oral  SpO2: 94% 96% 98%   Weight: 106.6 kg     Height:        Intake/Output Summary (Last 24 hours) at 12/25/2019 1654 Last data filed at 12/25/2019 1200 Gross per 24 hour  Intake 860 ml  Output 2150 ml  Net -1290 ml   Last 3 Weights 12/25/2019 12/24/2019 12/23/2019  Weight (lbs) 235 lb 1.6 oz 238 lb 1.6 oz 253 lb 8.5  oz  Weight (kg) 106.641 kg 108.001 kg 115 kg  VS:  BP (!) 147/93 (BP Location: Left Arm)   Pulse 83   Temp 97.9 F (36.6 C) (Oral)   Resp 20   Ht 6' 1" (1.854 m)   Wt 106.6 kg Comment: scale a  SpO2 98%   BMI 31.02 kg/m  , BMI Body mass index is 31.02 kg/m. GENERAL:  Well appearing HEENT: Pupils equal round and reactive, fundi not visualized, oral mucosa unremarkable NECK:  No jugular venous distention, waveform within normal limits, carotid upstroke brisk and symmetric, no bruits LUNGS:  Clear to auscultation bilaterally HEART:  RRR.  PMI not displaced or sustained,S1 and S2 within normal limits, no S3, no S4, no clicks, no rubs, no murmurs ABD:  Flat, positive bowel sounds normal in frequency in pitch, no bruits, no rebound, no guarding, no midline pulsatile mass, no hepatomegaly, no splenomegaly EXT:  2 plus pulses throughout, no edema, no cyanosis no clubbing SKIN:  No rashes no nodules NEURO:  Cranial nerves II through XII grossly intact, motor grossly intact throughout PSYCH:  Cognitively intact, oriented to person place and time   EKG:  The EKG was personally reviewed and demonstrates: Sinus tachycardia.  Frequent PACs.  Rate 110 bpm.  Right bundle branch block. Telemetry:  Telemetry was personally reviewed and demonstrates: Sinus rhythm  Relevant CV Studies: Echo 12/25/19: 1. Consider infiltrative cardiomyopathy such as amyloidosis or global  variant hypertrophic cardiomyopathy - LV wall thickness 1.8-1.9 cm. Left  ventricular ejection fraction, by estimation, is 60 to 65%. The left  ventricle has normal function. The left  ventricle has no regional wall motion abnormalities. There is severe  concentric left ventricular hypertrophy. Left ventricular diastolic  parameters are consistent with Grade II diastolic dysfunction  (pseudonormalization). Elevated left ventricular  end-diastolic pressure.  2. Right ventricular systolic function is normal. The right ventricular   size is normal. Tricuspid regurgitation signal is inadequate for assessing  PA pressure.  3. Left atrial size was mildly dilated.  4. The mitral valve is abnormal. Mild  mitral valve regurgitation.  5. The aortic valve is tricuspid. Aortic valve regurgitation is not  visualized. Mild aortic valve sclerosis is present, with no evidence of  aortic valve stenosis.  6. The inferior vena cava is normal in size with greater than 50%  respiratory variability, suggesting right atrial pressure of 3 mmHg.   Laboratory Data:  High Sensitivity Troponin:   Recent Labs  Lab 12/23/19 0931 12/23/19 1309  TROPONINIHS 183* 218*     Chemistry Recent Labs  Lab 12/23/19 0506 12/23/19 0506 12/24/19 0403 12/24/19 1153 12/25/19 0244  NA 138  --   --  140 141  K 4.5  --   --  3.5 3.5  CL 102  --   --  104 106  CO2 25  --   --  25 24  GLUCOSE 211*  --   --  169* 101*  BUN 42*  --   --  41* 42*  CREATININE 3.54*   < > 3.26* 3.30* 3.51*  CALCIUM 9.0  --   --  8.4* 8.3*  GFRNONAA 18*   < > 20* 20* 18*  GFRAA 21*   < > 23* 23* 21*  ANIONGAP 11  --   --  11 11   < > = values in this interval not displayed.    Recent Labs  Lab 12/23/19 0506  PROT 8.5*  ALBUMIN 3.2*  AST 34  ALT 36  ALKPHOS 145*  BILITOT 1.0   Hematology Recent Labs  Lab 12/23/19 0506 12/24/19 0557 12/25/19 0244  WBC 12.2* 14.4* 12.0*  RBC 4.70 4.65 4.05*  HGB 13.7 13.1 11.3*  HCT 42.8 41.1 36.0*  MCV 91.1 88.4 88.9  MCH 29.1 28.2 27.9  MCHC 32.0 31.9 31.4  RDW 13.9 14.1 14.1  PLT 282 267 222   BNP Recent Labs  Lab 12/23/19 0931  BNP 1,123.1*    DDimer No results for input(s): DDIMER in the last 168 hours.   Radiology/Studies:  DG Chest 2 View  Result Date: 12/23/2019 CLINICAL DATA:  Asthma, hypertension EXAM: CHEST - 2 VIEW COMPARISON:  05/19/2018 FINDINGS: The lungs are symmetrically expanded. Mild bibasilar atelectasis. Superimposed perihilar interstitial pulmonary edema, possibly cardiogenic in  nature. No pneumothorax. Small bilateral pleural effusions. Cardiac size within normal limits. No acute bone abnormality. IMPRESSION: Mild interstitial pulmonary edema and small bilateral pleural effusions, possibly cardiogenic in nature. Electronically Signed   By: Fidela Salisbury MD   On: 12/23/2019 05:29   DG Chest Port 1 View  Result Date: 12/25/2019 CLINICAL DATA:  Shortness of breath EXAM: PORTABLE CHEST 1 VIEW COMPARISON:  12/23/2019 and prior radiographs FINDINGS: Cardiomediastinal silhouette is unchanged. Mild bibasilar opacities are again identified. No pleural effusion or pneumothorax noted. No acute bony abnormalities are present. IMPRESSION: Unchanged mild bibasilar opacities/atelectasis. Electronically Signed   By: Margarette Canada M.D.   On: 12/25/2019 07:54   ECHOCARDIOGRAM COMPLETE  Result Date: 12/25/2019    ECHOCARDIOGRAM REPORT   Patient Name:   BERTRAND VOWELS Date of Exam: 12/25/2019 Medical Rec #:  [id number]    Height:       73.0 in Accession #:    [id number]   Weight:       235.1 lb Date of Birth:  06-27-1965    BSA:          2.305 m Patient Age:    29 years     BP:           169/107 mmHg Patient Gender: M  HR:           74 bpm. Exam Location:  Inpatient Procedure: 2D Echo Indications:    congestive heart failure 428.0  History:        Patient has no prior history of Echocardiogram examinations.                 Chronic kidney disease; Risk Factors:Hypertension, Dyslipidemia,                 Diabetes and Current Smoker.  Sonographer:    Johny Chess Referring Phys: [id number] [place]  1. Consider infiltrative cardiomyopathy such as amyloidosis or global variant hypertrophic cardiomyopathy - LV wall thickness 1.8-1.9 cm. Left ventricular ejection fraction, by estimation, is 60 to 65%. The left ventricle has normal function. The left ventricle has no regional wall motion abnormalities. There is severe concentric left ventricular hypertrophy. Left ventricular diastolic  parameters are consistent with Grade II diastolic dysfunction (pseudonormalization). Elevated left ventricular end-diastolic pressure.  2. Right ventricular systolic function is normal. The right ventricular size is normal. Tricuspid regurgitation signal is inadequate for assessing PA pressure.  3. Left atrial size was mildly dilated.  4. The mitral valve is abnormal. Mild mitral valve regurgitation.  5. The aortic valve is tricuspid. Aortic valve regurgitation is not visualized. Mild aortic valve sclerosis is present, with no evidence of aortic valve stenosis.  6. The inferior vena cava is normal in size with greater than 50% respiratory variability, suggesting right atrial pressure of 3 mmHg. Comparison(s): No prior Echocardiogram. FINDINGS  Left Ventricle: Consider infiltrative cardiomyopathy such as amyloidosis or global variant hypertrophic cardiomyopathy - LV wall thickness 1.8-1.9 cm. Left ventricular ejection fraction, by estimation, is 60 to 65%. The left ventricle has normal function. The left ventricle has no regional wall motion abnormalities. The left ventricular internal cavity size was small. There is severe concentric left ventricular hypertrophy. Left ventricular diastolic parameters are consistent with Grade II diastolic dysfunction (pseudonormalization). Elevated left ventricular end-diastolic pressure. Right Ventricle: The right ventricular size is normal. No increase in right ventricular wall thickness. Right ventricular systolic function is normal. Tricuspid regurgitation signal is inadequate for assessing PA pressure. Left Atrium: Left atrial size was mildly dilated. Right Atrium: Right atrial size was normal in size. Pericardium: There is no evidence of pericardial effusion. Mitral Valve: The mitral valve is abnormal. There is moderate thickening of the mitral valve leaflet(s). Mild mitral valve regurgitation. Tricuspid Valve: The tricuspid valve is grossly normal. Tricuspid valve  regurgitation is trivial. Aortic Valve: The aortic valve is tricuspid. Aortic valve regurgitation is not visualized. Mild aortic valve sclerosis is present, with no evidence of aortic valve stenosis. Pulmonic Valve: The pulmonic valve was grossly normal. Pulmonic valve regurgitation is not visualized. Aorta: The aortic root and ascending aorta are structurally normal, with no evidence of dilitation. Venous: The inferior vena cava is normal in size with greater than 50% respiratory variability, suggesting right atrial pressure of 3 mmHg. IAS/Shunts: No atrial level shunt detected by color flow Doppler.  LEFT VENTRICLE PLAX 2D LVIDd:         5.00 cm      Diastology LVIDs:         3.30 cm      LV e' medial:    5.33 cm/s LV PW:         1.90 cm      LV E/e' medial:  19.3 LV IVS:        1.80 cm  LV e' lateral:   5.44 cm/s LVOT diam:     2.20 cm      LV E/e' lateral: 18.9 LV SV:         82 LV SV Index:   36 LVOT Area:     3.80 cm  LV Volumes (MOD) LV vol d, MOD A2C: 121.0 ml LV vol d, MOD A4C: 116.0 ml LV vol s, MOD A2C: 65.9 ml LV vol s, MOD A4C: 61.2 ml LV SV MOD A2C:     55.1 ml LV SV MOD A4C:     116.0 ml LV SV MOD BP:      56.1 ml RIGHT VENTRICLE             IVC RV S prime:     12.30 cm/s  IVC diam: 1.70 cm TAPSE (M-mode): 1.7 cm LEFT ATRIUM             Index       RIGHT ATRIUM           Index LA diam:        4.50 cm 1.95 cm/m  RA Area:     15.90 cm LA Vol (A2C):   83.1 ml 36.06 ml/m RA Volume:   42.90 ml  18.61 ml/m LA Vol (A4C):   80.9 ml 35.10 ml/m LA Biplane Vol: 82.1 ml 35.62 ml/m  AORTIC VALVE LVOT Vmax:   114.00 cm/s LVOT Vmean:  80.400 cm/s LVOT VTI:    0.217 m  AORTA Ao Root diam: 3.10 cm Ao Asc diam:  3.40 cm MITRAL VALVE MV Area (PHT): 3.91 cm     SHUNTS MV Decel Time: 194 msec     Systemic VTI:  0.22 m MV E velocity: 103.00 cm/s  Systemic Diam: 2.20 cm MV A velocity: 72.00 cm/s MV E/A ratio:  1.43 Lyman Bishop MD Electronically signed by Lyman Bishop MD Signature Date/Time: 12/25/2019/12:12:14  PM    Final     Assessment and Plan:   # Hypertensive urgency: # Acute diastolic heart failure: Blood pressures were in the 200s over 120s on admission.  Currently better controlled.  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  Therefore, recommend that he not be on hydrochlorothiazide or lisinopril, especially given that this seems to be acute on chronic and hopefully will improve.  Also would not recommend that he be on both hydrochlorothiazide and furosemide.  He now appears to be euvolemic.  We will stop his IV Lasix and switch to 40 mg oral daily.  We will start carvedilol 12.5 mg twice daily and hydralazine 50 mg twice daily.  He is advised to follow a 1500 mg sodium diet and not to ever use NSAIDs again.  Although concern was raised on his echo for an infiltrative cardiomyopathy, in the setting of someone with such poorly controlled hypertension it is far more likely that this is attributable to hypertensive cardiomyopathy.  He is not a candidate for a cardiac MRI right now anyways because he cannot have gadolinium with this degree of renal dysfunction.  Favor treating his hypertension and deferring any consideration for an infiltrative cardiomyopathy at this time.  He needs to follow up in our advanced hypertension clinic.   Secondary Causes of Hypertension  Medications/Herbal: OCP, steroids, stimulants, antidepressants, weight loss medication, immune suppressants, NSAIDs, sympathomimetics, alcohol, caffeine, licorice, ginseng, St. John's wort, chemo Sleep Apnea: defer testing for now Renal artery stenosis: check renal artery Doppler Hyperaldosteronism: defer testing for now Hyper/hypothyroidism: check  TSH Pheochromocytoma: (testing not indicated)  Cushing's syndrome: (testing not indicated)  Coarctation of the aorta: (testing not indicated)   # Acute on chronic renal failure: Currently euvolemic.  Recommend stopping hydrochlorothiazide as above.  Switch lasix to oral as  above.  He knows that he needs to see nephrology but has been unable to afford to do so.  Stopping all NSAIDs as above.  # Elevated troponin:   High-sensitivity troponin was elevated to 218.  He has not had any chest pain.  This is likely attributable to hypertensive urgency and we will not pursue further ischemic evaluation at this time.  Starting beta-blocker as above.  # Social determinants of health: Mr. Raulston is in a tough financial spot and does not qualify for many federal services.  We will contact our social worker to see what can be done to help him with obtaining necessary health services and medications.      For questions or updates, please contact [place] Please consult [email or web address] for contact info under    Signed, Skeet Latch, MD  12/25/2019 4:54 PM

## 2019-12-25 NOTE — Progress Notes (Signed)
Transfer Note: Hospital Course [place]: Mr. Crall is a 54 year old male with past medical history of HTN noncompliance to medication), gout, DM, asthma.  He has not been on any medications for a while due to lack of insurance.  He presented with shortness of breath 2 days ago.  A found to be severely hypertensive at 263/163.  His BNP was elevated at 1123.  Chest x-ray showed bilateral pleural effusion and mild interstitial pulmonary edema  Given hypertensive urgency and underlying flash pulmonary edema and CHF physiology, PCCM was consulted.  Patient was a started on nicardipine drip and diuresis with IV Lasix.  His symptoms improved.  His blood pressure improved and transferred to floor and IMTS take supportive care today.  Subjective:  Patient was seen and evaluated at bedside on morning rounds. No acute events overnight.  He mentions that he feels well.  His shortness of breath improved and he is able to lay down almost flat, unlike at home. He states his SOB started acutely about 3-4 days ago. He had to sleep sitting up. No similar symptoms in the past. He does say that he developed an acute gout flare on admission and continues to endorse LE pain although his LE swelling has improved back to his baseline.   He denies any chest pain. Hhe was not able to take his medication for a while due to lack of insurance.  He mentions that he would like to go home.  We discussed importance of taking his medications and will try to have him get his medicines after discharge.  Explained the plan of doing of echo today.  He agrees with plan.  Patient contact:Tiffany Hill [phone]; pt lives in [place] with her now. He has a doctor at [facility] (dr. Newland/Noonand?) [facility]. He last saw them 4 months ago and has an appt to see them 01/03/20.  He said due to SOB and was having coughing bouts. He states he had not been able to take his medications because [place].    Objective:  Vital signs in last 24 hours: Vitals:   12/24/19 1938 12/25/19 0058 12/25/19 0400 12/25/19 0730  BP: (!) 156/94 (!) 145/93 (!) 120/95 (!) 169/107  Pulse: 84 82 69 77  Resp: 20 20 15 20   Temp: 98.4 F (36.9 C) 98.6 F (37 C) 98.8 F (37.1 C) 98.1 F (36.7 C)  TempSrc: Oral Oral Oral Oral  SpO2: 98% 94% 96% 98%  Weight:  106.6 kg    Height:       Constitutional: Pleasant obese male, sitting in bed in no acute distress.  HENT:  Head: Normocephalic and atraumatic.  Eyes: Conjunctivae are normal, EOM nl Cardiovascular: RRR, nl S1S2, no murmur, no LEE, no JVD Respiratory: Effort normal and breath sounds normal. No respiratory distress. No wheezes.  GI: Soft. Bowel sounds are normal. No distension. There is no tenderness.  Neurological: Is alert and oriented x 3  Skin: Not diaphoretic. No erythema.  Psychiatric: Normal mood and affect. Behavior is normal. Judgment and thought content normal.    Assessment/Plan:  Active Problems:   Hypertensive urgency  Hypertensive urgency: Blood pressure 263/163 in ED on arrival. In setting of noncompliance to home antihypertensive medications. Resolved with nicardipine drip and now is on home meds. Blood pressure today is 169/117 with heart rate of 77.  -Continue current medication with: Amlodipine 10, HCTZ 12.5 mg daily, lisinopril 20 mg daily (May increase HCTZ to 25 mg daily  tomorrow) -Hopefully, he will be able to get his medication to $4 list. 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 -Per numbers documented in chart, he lost 18 pounds in the past 3 days after diuresis. Volume status improved with IV diuresis and blood pressure control. No JVD, no crackles on lung exam, no lower extremity edema.  -Echo -Repeat EKG -Strict I/Os -If echo not suggestive of ischemic  etiology or HFrEF may DC home with p.o. Lasix.  ADDENDUM: Echo: Consider infiltrative CMP such as amyloidosis or global varient hypertrophic CMP. EF 65-70%, grade 2DD.  -Reasonable to obtain cardiac MRI -Will consult cardiology for further recommendations -IV lasix 40 mg QD -Continue Imdur  CKD3 DM: -Cr remained at baseline with diuresis -SSI  Diet: HH, CM IV fluid: VTE ppx: Subcutaneous hep Code status: full  Prior to Admission Living Arrangement: home Anticipated Discharge Location: home Barriers to Discharge: CHF work up  Dispo: Anticipated discharge in approximately 1-2 day(s).   Dewayne Hatch, MD 12/25/2019, 12:54 PM Pager: [phone] After 5pm on weekdays and 1pm on weekends: On Call pager [phone]'

## 2019-12-26 ENCOUNTER — Inpatient Hospital Stay (HOSPITAL_COMMUNITY): Payer: Medicaid Other

## 2019-12-26 DIAGNOSIS — I10 Essential (primary) hypertension: Secondary | ICD-10-CM

## 2019-12-26 DIAGNOSIS — E785 Hyperlipidemia, unspecified: Secondary | ICD-10-CM

## 2019-12-26 LAB — CBC
HCT: 38.4 % — ABNORMAL LOW (ref 39.0–52.0)
Hemoglobin: 12.2 g/dL — ABNORMAL LOW (ref 13.0–17.0)
MCH: 28.5 pg (ref 26.0–34.0)
MCHC: 31.8 g/dL (ref 30.0–36.0)
MCV: 89.7 fL (ref 80.0–100.0)
Platelets: 235 10*3/uL (ref 150–400)
RBC: 4.28 MIL/uL (ref 4.22–5.81)
RDW: 14.2 % (ref 11.5–15.5)
WBC: 11.3 10*3/uL — ABNORMAL HIGH (ref 4.0–10.5)
nRBC: 0 % (ref 0.0–0.2)

## 2019-12-26 LAB — GLUCOSE, CAPILLARY
Glucose-Capillary: 141 mg/dL — ABNORMAL HIGH (ref 70–99)
Glucose-Capillary: 121 mg/dL — ABNORMAL HIGH (ref 70–99)
Glucose-Capillary: 96 mg/dL (ref 70–99)
Glucose-Capillary: 180 mg/dL — ABNORMAL HIGH (ref 70–99)

## 2019-12-26 LAB — BASIC METABOLIC PANEL
Anion gap: 8 (ref 5–15)
BUN: 48 mg/dL — ABNORMAL HIGH (ref 6–20)
CO2: 26 mmol/L (ref 22–32)
Calcium: 8.5 mg/dL — ABNORMAL LOW (ref 8.9–10.3)
Chloride: 104 mmol/L (ref 98–111)
Creatinine, Ser: 3.8 mg/dL — ABNORMAL HIGH (ref 0.61–1.24)
GFR calc Af Amer: 19 mL/min — ABNORMAL LOW (ref >60)
GFR calc non Af Amer: 17 mL/min — ABNORMAL LOW (ref >60)
Glucose, Bld: 152 mg/dL — ABNORMAL HIGH (ref 70–99)
Potassium: 3.7 mmol/L (ref 3.5–5.1)
Sodium: 138 mmol/L (ref 135–145)

## 2019-12-26 LAB — TSH: TSH: 1.153 u[IU]/mL (ref 0.350–4.500)

## 2019-12-26 LAB — MAGNESIUM: Magnesium: 1.9 mg/dL (ref 1.7–2.4)

## 2019-12-26 MED ORDER — FUROSEMIDE 40 MG PO TABS: 40.0000 mg | ORAL_TABLET | Freq: Every day | ORAL | Status: DC

## 2019-12-26 NOTE — Progress Notes (Signed)
   Subjective:   Patient evaluated at bedside. States that he is totally asymptomatic at this time. SOB and orthopnea has resolved. Denies CP, abdominal pain, headache.  States he is unable to obtain medications due to cost, is seen at [facility] but unable to get meds through their pharmacy because he owes money there. Does not think he has received meds from [facility] this year.  Objective:  Vital signs in last 24 hours: Vitals:   12/26/19 0014 12/26/19 0134 12/26/19 0625 12/26/19 1017  BP:   (!) 155/102 (!) 187/122  Pulse: 79  78   Resp:   18   Temp: 98.8 F (37.1 C)  98.3 F (36.8 C)   TempSrc: Oral  Oral   SpO2: 96%  100%   Weight:  106.1 kg    Height:        Physical Exam Constitutional: no acute distress Head: atraumatic ENT: external ears normal Eyes: EOMI Cardiovascular: regular rate and rhythm, normal heart sounds, no LE edema Pulmonary: effort normal, lungs clear to ascultation bilaterally Abdominal: flat, bowel sounds normal Skin: warm and dry Neurological: alert, no focal deficit Psychiatric: normal mood and affect  Assessment/Plan: Craig West is a 54 y.o. male with hx of HTN, HFpEF, asthma, HLD, gout, DM presenting with HTN emergency with flash pulmonary edema. Has not had his medications in some time due to cost, no insurance. BP now controlled with PO meds, diuresed well and SOB resolved.   Principal Problem:   Acute diastolic heart failure (HCC) Active Problems:   Hypertensive urgency   Acute renal failure superimposed on stage 4 chronic kidney disease ([place])   Demand ischemia (HCC)  Hypertensive urgency: Blood pressure 263/163 in ED on arrival, In setting of noncompliance to home antihypertensive medications due to cost. Resolved with nicardipine drip and now is on PO meds. BP 121/65 most recently, but fairly labile today. -cards consulted, appreciate recs    -stop hctz and lisinopril    -start Coreg 12.5mg  BID    -start  hydralazine 50mg  BID    -continue amlodipine 10mg  daily    -continue Imdur 60mg  daily -f/u renal US, preliminary read without renal vascular stenosis -f/u TSH -hopefully will be able to get meds from [facility], then hopefully on $4 list -Follow-up with PCP scheduled in a week after discharge  Flash pulmonary edema HFpEF Cardiomyopathy secondary to uncontrolled hypertension +/-exacerbation of undiagnosed heart failure. Lost 18 pounds in the past 3 days after diuresis. Volume status improved with IV diuresis and blood pressure control. No JVD, no crackles on lung exam, no lower extremity edema. Echo with [id number]%, grade 2 diastolic dysfunction, concern for infiltrative cardiomyopathy. However, cardiology feels much more likely to be hypertensive in etiology. Unable to get cardiac MRI due to renal function. Further infiltrative workup deferred. -cards consulted, appreciate recs -PO Lasix 40mg  daily -f/u with advanced HTN clinic -Strict I/Os  CKD3 DM: -Cr remained at baseline with diuresis -SSI  Diet:  Renal carb modified IVF:  none VTE:  heparin Prior to Admission Living Arrangement: home Anticipated Discharge Location:  home Barriers to Discharge:  Requires prescription meds but unable to afford Dispo: Anticipated discharge in approximately 1 day(s).   Andrew Au, MD 12/26/2019, 2:01 PM Pager: [phone] After 5pm on weekdays and 1pm on weekends: On Call pager [phone]

## 2019-12-26 NOTE — Discharge Summary (Signed)
Name: Craig West MRN: [id number] DOB: 1965-11-22 54 y.o. PCP: Charlott Rakes, MD  Date of Admission: 12/23/2019  4:50 AM Date of Discharge: 12/27/19 Attending Physician: Oda Kilts, MD  Discharge Diagnosis: 1. Hypertensive emergency 2. Flash pulmonary edema 3. HFpEF 4. Progression of CKD4   Discharge Medications: Allergies as of 12/27/2019   No Known Allergies     Medication List    STOP taking these medications   lisinopril-hydrochlorothiazide 20-12.5 MG tablet Commonly known as: Zestoretic     TAKE these medications   albuterol 108 (90 Base) MCG/ACT inhaler Commonly known as: VENTOLIN HFA Inhale 2 puffs into the lungs every 6 (six) hours as needed for wheezing or shortness of breath.   allopurinol 100 MG tablet Commonly known as: ZYLOPRIM Take 2 tablets (200 mg total) by mouth daily. For prevention of gout   amLODipine 10 MG tablet Commonly known as: NORVASC Take 1 tablet (10 mg total) by mouth daily. For hypertension   atorvastatin 40 MG tablet Commonly known as: LIPITOR Take 1 tablet (40 mg total) by mouth daily. For hypercholesterolemia   carvedilol 12.5 MG tablet Commonly known as: COREG Take 1 tablet (12.5 mg total) by mouth 2 (two) times daily with a meal.   colchicine 0.6 MG tablet Take 2 tablets (1.2 mg) orally at the onset of a gout attack; may repeat 1 tablet (0.6 mg) in 2 hours if symptoms persist   Digital Glass Scale Misc Use scale to weight yourself. Increasing weight may indicate fluid overload   furosemide 40 MG tablet Commonly known as: LASIX Take 1 tablet (40 mg total) by mouth daily.   gabapentin 300 MG capsule Commonly known as: NEURONTIN Take 1 capsule (300 mg total) by mouth 2 (two) times daily. For diabetic neuropathy   glipiZIDE 10 MG tablet Commonly known as: GLUCOTROL Take 1 tablet (10 mg total) by mouth 2 (two) times daily before a meal.   hydrALAZINE 50 MG tablet Commonly known as: APRESOLINE Take 1 tablet  (50 mg total) by mouth every 12 (twelve) hours. What changed:   medication strength  how much to take  when to take this   HYDROcodone-acetaminophen 5-325 MG tablet Commonly known as: NORCO/VICODIN Take 1 tablet by mouth every 4 (four) hours as needed.   isosorbide mononitrate 60 MG 24 hr tablet Commonly known as: IMDUR Take 1 tablet (60 mg total) by mouth daily. What changed: how much to take   tamsulosin 0.4 MG Caps capsule Commonly known as: FLOMAX Take 1 capsule (0.4 mg total) by mouth daily.   True Metrix Blood Glucose Test test strip Generic drug: glucose blood Use as instructed   True Metrix Meter w/Device Kit Check blood sugar fasting and ant bedtime and record   TRUEplus Lancets 28G Misc Check blood sugar fasting and at bedtime       Disposition and follow-up:   Craig West was discharged from [facility] in Good condition.  At the hospital follow up visit please address:  1.  Follow up      A. Hypertension - BP fairly well controlled with PO meds, adjust as needed, please assist with affording medications     B. HFpEF - adjust Lasix dose as needed     C. Progression of CKD4 - recheck creatinine, consider outpatient nephro referral  2.  Labs / imaging needed at time of follow-up: BMP  3.  Pending labs/ test needing follow-up: none  Follow-up Appointments:  Follow-up [place],  Tiffany, MD Follow up on 01/05/2020.   Specialty: Cardiology Why: at 2:30pm for your follow up in the hypertension clinic.  Contact information: [address] [place] [address] [phone]        [place]. Go on 01/03/2020.   Why: @9 :50am Contact information: [place] [address] [phone]              Hospital Course by problem list:  Hypertensive urgency Flash pulmonary edema HFpEF Presented with acute onset SOB and leg swelling, blood  pressure 263/163 in ED on arrival, in setting of noncompliance to home antihypertensive medications due to cost. Treated with nicardipine drip and IV Lasix. 18 pound decrease in weight with diuresis during this admission. No JVD, no crackles on lung exam, no lower extremity edema. Transitioned to PO BP medications and PO Lasix, home meds adjusted for renal function. Echo with [id number]%, grade 2 diastolic dysfunction, concern for infiltrative cardiomyopathy. However, cardiology feels much more likely to be hypertensive in etiology. Unable to get cardiac MRI due to renal function. Further infiltrative workup deferred. Renal ultrasound without evidence of renal vascular stenosis. TSH normal. Patient denies any SOB or orthopnea on day of discharge. F/u with advanced HTN clinic. 1 month supply of medications obtained through [facility]. To follow up with PCP at [facility].    CKD4 Most recent creatinine prior to admission was 3.35. On admission was 3.54 and fluctuated around that level. 3.67 at time of discharge. Consider outpatient referral to nephrology.  Discharge Vitals:   BP (!) 131/92 (BP Location: Left Arm)   Pulse 72   Temp (!) 97.4 F (36.3 C) (Oral)   Resp 18   Ht 6' 1"  (1.854 m)   Wt 105.5 kg   SpO2 100%   BMI 30.69 kg/m   Pertinent Labs, Studies, and Procedures:  TTE 9/25 IMPRESSIONS    1. Consider infiltrative cardiomyopathy such as amyloidosis or global  variant hypertrophic cardiomyopathy - LV wall thickness 1.8-1.9 cm. Left  ventricular ejection fraction, by estimation, is 60 to 65%. The left  ventricle has normal function. The left  ventricle has no regional wall motion abnormalities. There is severe  concentric left ventricular hypertrophy. Left ventricular diastolic  parameters are consistent with Grade II diastolic dysfunction  (pseudonormalization). Elevated left ventricular  end-diastolic pressure.  2. Right ventricular systolic function is normal.  The right ventricular  size is normal. Tricuspid regurgitation signal is inadequate for assessing  PA pressure.  3. Left atrial size was mildly dilated.  4. The mitral valve is abnormal. Mild mitral valve regurgitation.  5. The aortic valve is tricuspid. Aortic valve regurgitation is not  visualized. Mild aortic valve sclerosis is present, with no evidence of  aortic valve stenosis.  6. The inferior vena cava is normal in size with greater than 50%  respiratory variability, suggesting right atrial pressure of 3 mmHg.  Renal ultrasound 9/26 Right: Normal size right kidney. Normal right Resisitive Index. No evidence of right renal artery stenosis. RRV flow present. Cyst(s) noted. Left: Normal size of left kidney. Normal left Resistive Index. No evidence of left renal artery stenosis. LRV flow present. Cyst(s) noted.   DG Chest 2 View  Result Date: 12/23/2019 CLINICAL DATA:  Asthma, hypertension EXAM: CHEST - 2 VIEW COMPARISON:  05/19/2018 FINDINGS: The lungs are symmetrically expanded. Mild bibasilar atelectasis. Superimposed perihilar interstitial pulmonary edema, possibly cardiogenic in nature. No pneumothorax. Small bilateral pleural effusions. Cardiac size  within normal limits. No acute bone abnormality. IMPRESSION: Mild interstitial pulmonary edema and small bilateral pleural effusions, possibly cardiogenic in nature. Electronically Signed   By: Fidela Salisbury MD   On: 12/23/2019 05:29    Discharge Instructions: Discharge Instructions    Call MD for:  extreme fatigue   Complete by: As directed    Call MD for:  persistant dizziness or light-headedness   Complete by: As directed    Diet - low sodium heart healthy   Complete by: As directed    Discharge instructions   Complete by: As directed    Craig West, it has been a pleasure taking care of you! We believe that your symptoms were caused by your high blood pressure and mild heart failure. I am sending you home with 30 days of  medications (this is the maximum that I could get for free through the [facility] program). Please follow up with your PCP. They may be able to help further. Besides that, many of these are affordable at [facility].   The new medications are carvedilol and furosemide  Here are your medications for blood pressure: Amlodipine (Norvasc) 666m, once a day.  Imdur XR (Isosorbide mononitrate) 646m once a day.  Carvedilol (Coreg) 12.66m60mtwice a day.  Hydralazine 29m53mwice a day.  Lasix (furosemide) 40mg54mce a day. This will keep you from accumulating lfuid. Atorvastatin (Lipitor) 40mg,30me a day. This is for cholesterol. Glipizide 10mg, 30me a day before meals. This is for your blood sugar. Gabapentin (Neurontin) 300mg, t72m a day. This is for diabetic neuropathy Albuterol inhaler. As needed for wheezing. Allopurinol 100mg onc74mday. Take this daily to prevent gout. Colchicine 0.6mg. This2m to treat episodes of gout, take it only when you are having pain.  Other than taking these medications, please 1) [facility] 2) follow up with the advanced heart failure clinic  If you have difficulty with cost and must prioritize medications, I would say that the most important are: Amlodipine Imdur Hydralazine Carvedilol Glipizide Lasix   Increase activity slowly   Complete by: As directed       Signed: Tiye Huwe, JoshAndrew Au2021, 11:43 AM   Pager: [phone]

## 2019-12-26 NOTE — Progress Notes (Signed)
Progress Note  Patient Name: Craig West Date of Encounter: 12/26/2019  [facility] Cardiologist: No primary care provider on file.   Subjective   Feeling OK.  No chest pain and breathing is stable.  Reports good urine output  Inpatient Medications    Scheduled Meds: . AeroChamber Plus Flo-Vu Large  1 each Other Once  . amLODipine  10 mg Oral Daily  . carvedilol  12.5 mg Oral BID WC  . heparin  5,000 Units Subcutaneous Q8H  . hydrALAZINE  50 mg Oral Q12H  . insulin aspart  0-9 Units Subcutaneous TID WC  . isosorbide mononitrate  60 mg Oral Daily   Continuous Infusions:  PRN Meds: acetaminophen, albuterol, docusate sodium, ondansetron (ZOFRAN) IV, polyethylene glycol   Vital Signs    Vitals:   12/26/19 0010 12/26/19 0014 12/26/19 0134 12/26/19 0625  BP: (!) 155/95   (!) 155/102  Pulse: 79 79  78  Resp: 18   18  Temp: 98.8 F (37.1 C) 98.8 F (37.1 C)  98.3 F (36.8 C)  TempSrc: Oral Oral  Oral  SpO2: 96% 96%  100%  Weight:   106.1 kg   Height:        Intake/Output Summary (Last 24 hours) at 12/26/2019 1018 Last data filed at 12/26/2019 0900 Gross per 24 hour  Intake 200 ml  Output 600 ml  Net -400 ml   Last 3 Weights 12/26/2019 12/25/2019 12/24/2019  Weight (lbs) 233 lb 14.4 oz 235 lb 1.6 oz 238 lb 1.6 oz  Weight (kg) 106.096 kg 106.641 kg 108.001 kg      Telemetry    Sinus rhythm.  25-second run of NSVT yesterday.- Personally Reviewed  ECG    12/23/2019: Sinus tachycardia.  Rate 110 bpm.  PACs.  Right bundle branch block.  Left atrial enlargement. - Personally Reviewed  Physical Exam   VS:  BP (!) 155/102 (BP Location: Left Arm)   Pulse 78   Temp 98.3 F (36.8 C) (Oral)   Resp 18   Ht 6\' 1"  (1.854 m)   Wt 106.1 kg Comment: scale a  SpO2 100%   BMI 30.86 kg/m  , BMI Body mass index is 30.86 kg/m. GENERAL:  Well appearing HEENT: Pupils equal round and reactive, fundi not visualized, oral mucosa unremarkable NECK:  No jugular venous  distention, waveform within normal limits, carotid upstroke brisk and symmetric, no bruits LUNGS:  Clear to auscultation bilaterally HEART:  RRR.  PMI not displaced or sustained,S1 and S2 within normal limits, no S3, no S4, no clicks, no rubs, II/VI systolic murmur ABD:  Flat, positive bowel sounds normal in frequency in pitch, no bruits, no rebound, no guarding, no midline pulsatile mass, no hepatomegaly, no splenomegaly EXT:  2 plus pulses throughout, no edema, no cyanosis no clubbing SKIN:  No rashes no nodules NEURO:  Cranial nerves II through XII grossly intact, motor grossly intact throughout PSYCH:  Cognitively intact, oriented to person place and time   Labs    High Sensitivity Troponin:   Recent Labs  Lab 12/23/19 0931 12/23/19 1309  TROPONINIHS 183* 218*      Chemistry Recent Labs  Lab 12/23/19 0506 12/24/19 0403 12/24/19 1153 12/25/19 0244 12/26/19 0635  NA 138  --  140 141 138  K 4.5  --  3.5 3.5 3.7  CL 102  --  104 106 104  CO2 25  --  25 24 26   GLUCOSE 211*  --  169* 101* 152*  BUN 42*  --  41* 42* 48*  CREATININE 3.54*   < > 3.30* 3.51* 3.80*  CALCIUM 9.0  --  8.4* 8.3* 8.5*  PROT 8.5*  --   --   --   --   ALBUMIN 3.2*  --   --   --   --   AST 34  --   --   --   --   ALT 36  --   --   --   --   ALKPHOS 145*  --   --   --   --   BILITOT 1.0  --   --   --   --   GFRNONAA 18*   < > 20* 18* 17*  GFRAA 21*   < > 23* 21* 19*  ANIONGAP 11  --  11 11 8    < > = values in this interval not displayed.     Hematology Recent Labs  Lab 12/24/19 0557 12/25/19 0244 12/26/19 0635  WBC 14.4* 12.0* 11.3*  RBC 4.65 4.05* 4.28  HGB 13.1 11.3* 12.2*  HCT 41.1 36.0* 38.4*  MCV 88.4 88.9 89.7  MCH 28.2 27.9 28.5  MCHC 31.9 31.4 31.8  RDW 14.1 14.1 14.2  PLT 267 222 235    BNP Recent Labs  Lab 12/23/19 0931  BNP 1,123.1*     DDimer No results for input(s): DDIMER in the last 168 hours.   Radiology    DG Chest Port 1 View  Result Date:  12/25/2019 CLINICAL DATA:  Shortness of breath EXAM: PORTABLE CHEST 1 VIEW COMPARISON:  12/23/2019 and prior radiographs FINDINGS: Cardiomediastinal silhouette is unchanged. Mild bibasilar opacities are again identified. No pleural effusion or pneumothorax noted. No acute bony abnormalities are present. IMPRESSION: Unchanged mild bibasilar opacities/atelectasis. Electronically Signed   By: Margarette Canada M.D.   On: 12/25/2019 07:54   ECHOCARDIOGRAM COMPLETE  Result Date: 12/25/2019    ECHOCARDIOGRAM REPORT   Patient Name:   Craig West Date of Exam: 12/25/2019 Medical Rec #:  [id number]    Height:       73.0 in Accession #:    [id number]   Weight:       235.1 lb Date of Birth:  10/24/1965    BSA:          2.305 m Patient Age:    54 years     BP:           169/107 mmHg Patient Gender: M            HR:           74 bpm. Exam Location:  Inpatient Procedure: 2D Echo Indications:    congestive heart failure 428.0  History:        Patient has no prior history of Echocardiogram examinations.                 Chronic kidney disease; Risk Factors:Hypertension, Dyslipidemia,                 Diabetes and Current Smoker.  Sonographer:    Johny Chess Referring Phys: [id number] [place]  1. Consider infiltrative cardiomyopathy such as amyloidosis or global variant hypertrophic cardiomyopathy - LV wall thickness 1.8-1.9 cm. Left ventricular ejection fraction, by estimation, is 60 to 65%. The left ventricle has normal function. The left ventricle has no regional wall motion abnormalities. There is severe concentric left ventricular hypertrophy. Left ventricular diastolic parameters are consistent with Grade II diastolic dysfunction (pseudonormalization). Elevated left ventricular end-diastolic pressure.  2. Right ventricular systolic function is normal. The right ventricular size is normal. Tricuspid regurgitation signal is inadequate for assessing PA pressure.  3. Left atrial size was mildly dilated.  4. The  mitral valve is abnormal. Mild mitral valve regurgitation.  5. The aortic valve is tricuspid. Aortic valve regurgitation is not visualized. Mild aortic valve sclerosis is present, with no evidence of aortic valve stenosis.  6. The inferior vena cava is normal in size with greater than 50% respiratory variability, suggesting right atrial pressure of 3 mmHg. Comparison(s): No prior Echocardiogram. FINDINGS  Left Ventricle: Consider infiltrative cardiomyopathy such as amyloidosis or global variant hypertrophic cardiomyopathy - LV wall thickness 1.8-1.9 cm. Left ventricular ejection fraction, by estimation, is 60 to 65%. The left ventricle has normal function. The left ventricle has no regional wall motion abnormalities. The left ventricular internal cavity size was small. There is severe concentric left ventricular hypertrophy. Left ventricular diastolic parameters are consistent with Grade II diastolic dysfunction (pseudonormalization). Elevated left ventricular end-diastolic pressure. Right Ventricle: The right ventricular size is normal. No increase in right ventricular wall thickness. Right ventricular systolic function is normal. Tricuspid regurgitation signal is inadequate for assessing PA pressure. Left Atrium: Left atrial size was mildly dilated. Right Atrium: Right atrial size was normal in size. Pericardium: There is no evidence of pericardial effusion. Mitral Valve: The mitral valve is abnormal. There is moderate thickening of the mitral valve leaflet(s). Mild mitral valve regurgitation. Tricuspid Valve: The tricuspid valve is grossly normal. Tricuspid valve regurgitation is trivial. Aortic Valve: The aortic valve is tricuspid. Aortic valve regurgitation is not visualized. Mild aortic valve sclerosis is present, with no evidence of aortic valve stenosis. Pulmonic Valve: The pulmonic valve was grossly normal. Pulmonic valve regurgitation is not visualized. Aorta: The aortic root and ascending aorta are  structurally normal, with no evidence of dilitation. Venous: The inferior vena cava is normal in size with greater than 50% respiratory variability, suggesting right atrial pressure of 3 mmHg. IAS/Shunts: No atrial level shunt detected by color flow Doppler.  LEFT VENTRICLE PLAX 2D LVIDd:         5.00 cm      Diastology LVIDs:         3.30 cm      LV e' medial:    5.33 cm/s LV PW:         1.90 cm      LV E/e' medial:  19.3 LV IVS:        1.80 cm      LV e' lateral:   5.44 cm/s LVOT diam:     2.20 cm      LV E/e' lateral: 18.9 LV SV:         82 LV SV Index:   36 LVOT Area:     3.80 cm  LV Volumes (MOD) LV vol d, MOD A2C: 121.0 ml LV vol d, MOD A4C: 116.0 ml LV vol s, MOD A2C: 65.9 ml LV vol s, MOD A4C: 61.2 ml LV SV MOD A2C:     55.1 ml LV SV MOD A4C:     116.0 ml LV SV MOD BP:      56.1 ml RIGHT VENTRICLE             IVC RV S prime:     12.30 cm/s  IVC diam: 1.70 cm TAPSE (M-mode): 1.7 cm LEFT ATRIUM             Index       RIGHT  ATRIUM           Index LA diam:        4.50 cm 1.95 cm/m  RA Area:     15.90 cm LA Vol (A2C):   83.1 ml 36.06 ml/m RA Volume:   42.90 ml  18.61 ml/m LA Vol (A4C):   80.9 ml 35.10 ml/m LA Biplane Vol: 82.1 ml 35.62 ml/m  AORTIC VALVE LVOT Vmax:   114.00 cm/s LVOT Vmean:  80.400 cm/s LVOT VTI:    0.217 m  AORTA Ao Root diam: 3.10 cm Ao Asc diam:  3.40 cm MITRAL VALVE MV Area (PHT): 3.91 cm     SHUNTS MV Decel Time: 194 msec     Systemic VTI:  0.22 m MV E velocity: 103.00 cm/s  Systemic Diam: 2.20 cm MV A velocity: 72.00 cm/s MV E/A ratio:  1.43 Lyman Bishop MD Electronically signed by Lyman Bishop MD Signature Date/Time: 12/25/2019/12:12:14 PM    Final    VAS US RENAL ARTERY DUPLEX  Result Date: 12/26/2019 ABDOMINAL VISCERAL Indications: Hypertension High Risk Factors: Hypertension. Limitations: Air/bowel gas and obesity. Comparison Study: No prior studies. Performing Technologist: Darlin Coco, RDMS  Examination Guidelines: A complete evaluation includes B-mode imaging, spectral  Doppler, color Doppler, and power Doppler as needed of all accessible portions of each vessel. Bilateral testing is considered an integral part of a complete examination. Limited examinations for reoccurring indications may be performed as noted.  Duplex Findings: +--------------------+--------+--------+------+--------+ Mesenteric          PSV cm/sEDV cm/sPlaqueComments +--------------------+--------+--------+------+--------+ Aorta at SMA          122                          +--------------------+--------+--------+------+--------+ Aorta Mid             118                          +--------------------+--------+--------+------+--------+ Celiac Artery Origin  103                          +--------------------+--------+--------+------+--------+ SMA Proximal          121      30                  +--------------------+--------+--------+------+--------+ SMA Mid               151      35                  +--------------------+--------+--------+------+--------+    +------------------+--------+--------+-------+ Right Renal ArteryPSV cm/sEDV cm/sComment +------------------+--------+--------+-------+ Origin               57      10           +------------------+--------+--------+-------+ Proximal             66      15           +------------------+--------+--------+-------+ Mid                  80      28           +------------------+--------+--------+-------+ Distal               71      24           +------------------+--------+--------+-------+ +-----------------+--------+--------+-------+ Left Renal ArteryPSV  cm/sEDV cm/sComment +-----------------+--------+--------+-------+ Origin              66      18           +-----------------+--------+--------+-------+ Proximal            58      18           +-----------------+--------+--------+-------+ Mid                 46      15           +-----------------+--------+--------+-------+ Distal               73      20           +-----------------+--------+--------+-------+ +------------+--------+--------+----+-----------+--------+--------+----+ Right KidneyPSV cm/sEDV cm/sRI  Left KidneyPSV cm/sEDV cm/sRI   +------------+--------+--------+----+-----------+--------+--------+----+ Upper Pole  20      9       0.53Upper Pole 21      10      0.53 +------------+--------+--------+----+-----------+--------+--------+----+ Mid         24      11      0.55Mid        32      13      0.60 +------------+--------+--------+----+-----------+--------+--------+----+ Lower Pole  20      9       0.53Lower Pole 21      8       0.61 +------------+--------+--------+----+-----------+--------+--------+----+ Hilar       106     31      0.71Hilar      42      16      0.60 +------------+--------+--------+----+-----------+--------+--------+----+ +------------------+-----+------------------+-----+ Right Kidney           Left Kidney             +------------------+-----+------------------+-----+ RAR                    RAR                     +------------------+-----+------------------+-----+ RAR (manual)      0.66 RAR (manual)      0.54  +------------------+-----+------------------+-----+ Cortex            0.52 Cortex            0.52  +------------------+-----+------------------+-----+ Cortex thickness       Corex thickness         +------------------+-----+------------------+-----+ Kidney length (cm)10.48Kidney length (cm)11.05 +------------------+-----+------------------+-----+   Summary: Renal:  Right: Normal size right kidney. Normal right Resisitive Index. No        evidence of right renal artery stenosis. RRV flow present.        Cyst(s) noted. Left:  Normal size of left kidney. Normal left Resistive Index. No        evidence of left renal artery stenosis. LRV flow present.        Cyst(s) noted.  *See table(s) above for measurements and observations.      Preliminary     Cardiac Studies   Echo 12/25/19: 1. Consider infiltrative cardiomyopathy such as amyloidosis or global  variant hypertrophic cardiomyopathy - LV wall thickness 1.8-1.9 cm. Left  ventricular ejection fraction, by estimation, is 60 to 65%. The left  ventricle has normal function. The left  ventricle has no regional wall motion abnormalities. There is severe  concentric left ventricular hypertrophy. Left ventricular diastolic  parameters are consistent with Grade II diastolic  dysfunction  (pseudonormalization). Elevated left ventricular  end-diastolic pressure.  2. Right ventricular systolic function is normal. The right ventricular  size is normal. Tricuspid regurgitation signal is inadequate for assessing  PA pressure.  3. Left atrial size was mildly dilated.  4. The mitral valve is abnormal. Mild mitral valve regurgitation.  5. The aortic valve is tricuspid. Aortic valve regurgitation is not  visualized. Mild aortic valve sclerosis is present, with no evidence of  aortic valve stenosis.  6. The inferior vena cava is normal in size with greater than 50%  respiratory variability, suggesting right atrial pressure of 3 mmHg.   Renal artery Doppler pending  Patient Profile      54 y.o. male with a hx of hypertension, diabetes, and asthma here with hypertensive urgency and diastolic heart failure.  Assessment & Plan    # Hypertensive urgency: # Acute diastolic heart failure: Blood pressures were in the 200s over 120s on admission.  Currently better controlled.  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    HCTZ and lisinopril were held.  He is euvolemic so furosemide was planned to be switched to oral.  However his creatinine continues to worsen today so we will hold it. Also would not recommend that he be on both hydrochlorothiazide and furosemide.  He now appears to be euvolemic.  We will stop his IV Lasix and switch to 40 mg oral daily.  We will  start carvedilol 12.5 mg twice daily.  Continue hydralazine 50 mg twice daily.  He is advised to follow a 1500 mg sodium diet, 2 L of fluid, and not to ever use NSAIDs again.  Although concern was raised on his echo for an infiltrative cardiomyopathy, in the setting of someone with such poorly controlled hypertension it is far more likely that this is attributable to hypertensive cardiomyopathy.  He is not a candidate for a cardiac MRI right now anyways because he cannot have gadolinium with this degree of renal dysfunction.  Favor treating his hypertension and deferring any consideration for an infiltrative cardiomyopathy at this time.  He needs to follow up in our advanced hypertension clinic.   Secondary Causes of Hypertension  Medications/Herbal: OCP, steroids, stimulants, antidepressants, weight loss medication, immune suppressants, NSAIDs, sympathomimetics, alcohol, caffeine, licorice, ginseng, St. John's wort, chemo Sleep Apnea: defer testing for now Renal artery stenosis: check renal artery Doppler Hyperaldosteronism: defer testing for now Hyper/hypothyroidism: check TSH Pheochromocytoma: (testing not indicated)  Cushing's syndrome: (testing not indicated)  Coarctation of the aorta: (testing not indicated)   # NSVT:  Starting carvedilol as above.  Potassium and magnesium are stable.  Consider outpatient ischemia evaluation, thought the treshold for LHC would be high given his CKD and lack of symptoms.   # Acute on chronic renal failure: Currently euvolemic. HCTZ and lasix are now on hold.  He knows that he needs to see nephrology but has been unable to afford to do so.  Stopping all NSAIDs as above.  If he is discharged today, he needs a BMP within a few days of discharge.  # Elevated troponin:  High-sensitivity troponin was elevated to 218.  He has not had any chest pain.  This is likely attributable to hypertensive urgency and we will not pursue further ischemic evaluation at this  time.  Starting beta-blocker as above.  # Social determinants of health: Mr. Lanzer is in a tough financial spot and does not qualify for many federal services.  We will contact our social  worker to see what can be done to help him with obtaining necessary health services and medications.      For questions or updates, please contact [place] Please consult [email or web address] for contact info under        Signed, Skeet Latch, MD  12/26/2019, 10:18 AM

## 2019-12-26 NOTE — Plan of Care (Signed)
  Problem: Education: Goal: Knowledge of General Education information will improve Description: Including pain rating scale, medication(s)/side effects and non-pharmacologic comfort measures Outcome: Progressing   Problem: Clinical Measurements: Goal: Ability to maintain clinical measurements within normal limits will improve Outcome: Progressing Goal: Diagnostic test results will improve Outcome: Progressing   Problem: Activity: Goal: Risk for activity intolerance will decrease Outcome: Progressing   Problem: Nutrition: Goal: Adequate nutrition will be maintained Outcome: Progressing   

## 2019-12-26 NOTE — Progress Notes (Addendum)
  ReDS Clip Diuretic Study Pt study # [id number]  Your patient has been enrolled in the ReDS Clip Diuretic Study   Changes to prescribed diuretics recommended:  Continue Furosemide 40mg  daily could consider BID  Provider contacted: IMTB Recommendation was accepted by provider.   REDS Clip  READING= down from 56% to 44% improved, pt feels better, good uop, weight down from admit to floor and renal function with slight bump 3.5> 3.8 Goal is < 35%  CHEST RULER = 34 Clip Station = D   Orthodema score = 3 Signs/Symptoms Score   Mild edema, no orthopnea 0 No congestion  Moderate edema, no orthopnea 1 Low-grade orthodema/congestion  Severe edema OR orthopnea 2   Moderate edema and orthopnea 3 High-grade orthodema/congestion  Severe edema AND orthopnea 4    Bonnita Nasuti Pharm.D. CPP, BCPS Clinical Pharmacist [phone] 12/26/2019 10:47 AM     Please check [email or web address] for unit-specific pharmacist phone numbers

## 2019-12-27 ENCOUNTER — Other Ambulatory Visit (HOSPITAL_COMMUNITY): Payer: Self-pay | Admitting: Student

## 2019-12-27 LAB — CBC
HCT: 38.3 % — ABNORMAL LOW (ref 39.0–52.0)
Hemoglobin: 12.1 g/dL — ABNORMAL LOW (ref 13.0–17.0)
MCH: 28.1 pg (ref 26.0–34.0)
MCHC: 31.6 g/dL (ref 30.0–36.0)
MCV: 88.9 fL (ref 80.0–100.0)
Platelets: 223 10*3/uL (ref 150–400)
RBC: 4.31 MIL/uL (ref 4.22–5.81)
RDW: 14.1 % (ref 11.5–15.5)
WBC: 9.5 10*3/uL (ref 4.0–10.5)
nRBC: 0 % (ref 0.0–0.2)

## 2019-12-27 LAB — BASIC METABOLIC PANEL
Anion gap: 6 (ref 5–15)
BUN: 47 mg/dL — ABNORMAL HIGH (ref 6–20)
CO2: 26 mmol/L (ref 22–32)
Calcium: 8.6 mg/dL — ABNORMAL LOW (ref 8.9–10.3)
Chloride: 106 mmol/L (ref 98–111)
Creatinine, Ser: 3.67 mg/dL — ABNORMAL HIGH (ref 0.61–1.24)
GFR calc Af Amer: 20 mL/min — ABNORMAL LOW (ref >60)
GFR calc non Af Amer: 17 mL/min — ABNORMAL LOW (ref >60)
Glucose, Bld: 172 mg/dL — ABNORMAL HIGH (ref 70–99)
Potassium: 3.6 mmol/L (ref 3.5–5.1)
Sodium: 138 mmol/L (ref 135–145)

## 2019-12-27 LAB — GLUCOSE, CAPILLARY
Glucose-Capillary: 98 mg/dL (ref 70–99)
Glucose-Capillary: 122 mg/dL — ABNORMAL HIGH (ref 70–99)
Glucose-Capillary: 117 mg/dL — ABNORMAL HIGH (ref 70–99)

## 2019-12-27 MED ORDER — TRUEPLUS LANCETS 28G MISC: 2 refills | Status: DC

## 2019-12-27 MED ORDER — ALLOPURINOL 100 MG PO TABS: 200.0000 mg | ORAL_TABLET | Freq: Every day | ORAL | 2 refills | Status: DC

## 2019-12-27 MED ORDER — HYDRALAZINE HCL 50 MG PO TABS
50.0000 mg | ORAL_TABLET | Freq: Two times a day (BID) | ORAL | 2 refills | Status: DC
Start: 2019-12-27 — End: 2020-02-23

## 2019-12-27 MED ORDER — AMLODIPINE BESYLATE 10 MG PO TABS: 10.0000 mg | ORAL_TABLET | Freq: Every day | ORAL | 2 refills | Status: DC

## 2019-12-27 MED ORDER — COLCHICINE 0.6 MG PO TABS
ORAL_TABLET | ORAL | 2 refills | Status: DC
  Filled 2020-07-03: qty 30, 30d supply, fill #0
  Filled 2020-09-29: qty 30, 15d supply, fill #1

## 2019-12-27 MED ORDER — FUROSEMIDE 40 MG PO TABS
40.0000 mg | ORAL_TABLET | Freq: Every day | ORAL | Status: DC
  Administered 2019-12-27: 40 mg via ORAL
  Filled 2019-12-27: qty 1

## 2019-12-27 MED ORDER — TAMSULOSIN HCL 0.4 MG PO CAPS: 0.4000 mg | ORAL_CAPSULE | Freq: Every day | ORAL | 2 refills | Status: DC

## 2019-12-27 MED ORDER — ALBUTEROL SULFATE HFA 108 (90 BASE) MCG/ACT IN AERS: 2.0000 | INHALATION_SPRAY | Freq: Four times a day (QID) | RESPIRATORY_TRACT | 2 refills | Status: DC | PRN

## 2019-12-27 MED ORDER — TRUE METRIX BLOOD GLUCOSE TEST VI STRP: ORAL_STRIP | 12 refills | Status: AC

## 2019-12-27 MED ORDER — CARVEDILOL 12.5 MG PO TABS
12.5000 mg | ORAL_TABLET | Freq: Two times a day (BID) | ORAL | 2 refills | Status: DC
Start: 2019-12-27 — End: 2020-04-05

## 2019-12-27 MED ORDER — DIGITAL GLASS SCALE MISC: 0 refills | Status: AC

## 2019-12-27 MED ORDER — ISOSORBIDE MONONITRATE ER 60 MG PO TB24
60.0000 mg | ORAL_TABLET | Freq: Every day | ORAL | 2 refills | Status: DC
Start: 2019-12-27 — End: 2020-01-11

## 2019-12-27 MED ORDER — ATORVASTATIN CALCIUM 40 MG PO TABS: 40.0000 mg | ORAL_TABLET | Freq: Every day | ORAL | 2 refills | Status: DC

## 2019-12-27 MED ORDER — GLIPIZIDE 10 MG PO TABS: 10.0000 mg | ORAL_TABLET | Freq: Two times a day (BID) | ORAL | 2 refills | Status: DC

## 2019-12-27 MED ORDER — FUROSEMIDE 40 MG PO TABS
40.0000 mg | ORAL_TABLET | Freq: Every day | ORAL | 2 refills | Status: DC
Start: 2019-12-27 — End: 2020-01-11

## 2019-12-27 MED ORDER — GABAPENTIN 300 MG PO CAPS: 300.0000 mg | ORAL_CAPSULE | Freq: Two times a day (BID) | ORAL | 2 refills | Status: DC

## 2019-12-27 MED FILL — GABAPENTIN 300 MG CAPSULE: 300 | 30 days supply | Qty: 60 | Fill #0

## 2019-12-27 MED FILL — AMLODIPINE BESYLATE 10 MG T: 10 | 30 days supply | Qty: 30 | Fill #0

## 2019-12-27 MED FILL — ATORVASTATIN CALCIUM 40 MG: 40 | 30 days supply | Qty: 30 | Fill #0

## 2019-12-27 MED FILL — TRUE METRIX GLUCOSE TEST ST: 30 days supply | Qty: 100 | Fill #0

## 2019-12-27 MED FILL — TAMSULOSIN HCL 0.4 MG CAP: 0.4 | 30 days supply | Qty: 30 | Fill #0

## 2019-12-27 MED FILL — hydrALAZINE HCL 50 MG TABS: 50 | 30 days supply | Qty: 60 | Fill #0

## 2019-12-27 MED FILL — TRUEplus LANCETS 28G MISC: 30 days supply | Qty: 200 | Fill #0

## 2019-12-27 MED FILL — ISOSORBIDE MN ER 60 MG TAB: 60 | 30 days supply | Qty: 30 | Fill #0

## 2019-12-27 MED FILL — ALLOPURINOL 100 MG TABLET: 100 | 30 days supply | Qty: 60 | Fill #0

## 2019-12-27 MED FILL — glipiZIDE 10 MG TABS: 10 | 30 days supply | Qty: 60 | Fill #0

## 2019-12-27 MED FILL — TRUE METRIX BLOOD GLUCOSE M: W/DEVICE | 20 days supply | Qty: 1 | Fill #0

## 2019-12-27 MED FILL — COLCHICINE 0.6 MG TABS: 0.6 | 5 days supply | Qty: 30 | Fill #0

## 2019-12-27 MED FILL — ALBUTEROL SULFATE HFA 108 (: 108 (90 BAS | 25 days supply | Qty: 18 | Fill #0

## 2019-12-27 MED FILL — FUROSEMIDE 40 MG TABLET: 40 | 30 days supply | Qty: 30 | Fill #0

## 2019-12-27 MED FILL — CARVEDILOL 12.5 MG TABLET: 12.5 | 30 days supply | Qty: 60 | Fill #0

## 2019-12-27 NOTE — Discharge Instructions (Signed)

## 2019-12-27 NOTE — Progress Notes (Signed)
Nutrition Education Note  RD consulted for nutrition education regarding CHF and diabetes.  Lab Results  Component Value Date   HGBA1C 7.6 (H) 12/24/2019   PTA DM medications are 10 mg glipizide BID.   Labs reviewed: CBGS: 117-180 (inpatient orders for glycemic control are 0-9 units insulin aspart TID with meals).   Pt unavailable at time of visit. Noted plans to d/c home today.   RD provided "Heart Healthy, Consistent Carbohydrate Nutrition Therapy" handout from the Academy of [employer] and Dietetics. Attached to AVS/ discharge instructions.   Current diet order is renal/ carb modified with 1200 ml fluid restriction, patient is consuming approximately 100% of meals at this time. Labs and medications reviewed. No further nutrition interventions warranted at this time. RD contact information provided. If additional nutrition issues arise, please re-consult RD.   Loistine Chance, RD, LDN, [place] Registered Dietitian II Certified Diabetes Care and Education Specialist Please refer to [facility] for RD and/or RD on-call/weekend/after hours pager

## 2019-12-27 NOTE — Progress Notes (Signed)
  ReDS Clip Diuretic Study Pt study # [id number]  Your patient has been enrolled in the ReDS Clip Diuretic Study   Changes to prescribed diuretics recommended:  Continue Furosemide 40mg  daily could consider BID  Provider contacted: IMTS Recommendation was accepted by provider.   REDS Clip  READING= down from 56% to 44% improved, pt feels better, good uop, weight down from admit to floor, and renal function improved today (SCr 3.8>3.67) Goal is < 35%  CHEST RULER = 34 Clip Station = D   Orthodema score = 0 Signs/Symptoms Score   Mild edema, no orthopnea 0 No congestion  Moderate edema, no orthopnea 1 Low-grade orthodema/congestion  Severe edema OR orthopnea 2   Moderate edema and orthopnea 3 High-grade orthodema/congestion  Severe edema AND orthopnea 4    Kerby Nora, PharmD, BCPS Phone [phone] 12/27/2019       8:34 AM  Please check [email or web address] for unit-specific pharmacist phone numbers

## 2019-12-27 NOTE — Plan of Care (Signed)

## 2019-12-27 NOTE — TOC Transition Note (Addendum)
Transition of Care [facility]) - CM/SW Discharge Note   Patient Details  Name: Itzael Liptak MRN: [id number] Date of Birth: 05/04/1965  [facility] CM/SW Contact:  Zenon Mayo, RN Phone Number: 12/27/2019, 9:58 AM   Clinical Narrative:    Patient for possible dc today, NCM spoke with patient, he goes to [place] clinic, secretary will make follow up apt,  He states he wants TOC to fill his med here before he is dc .  NCM assisting with [facility]. NCM checked with HF clinic for a scale, they did not have any more, NCM informed patient he will need to get a scale from [facility], he said ok.     Final next level of care: Home/Self Care Barriers to Discharge: No Barriers Identified   Patient Goals and CMS Choice Patient states their goals for this hospitalization and ongoing recovery are:: get better   Choice offered to / list presented to : NA  Discharge Placement                       Discharge Plan and Services                  DME Agency: NA       HH Arranged: NA          Social Determinants of Health (SDOH) Interventions     Readmission Risk Interventions No flowsheet data found.

## 2019-12-27 NOTE — Progress Notes (Addendum)
D/C instructions given and reviewed along with heart failure book. Questions answered at this time and encouraged to call with any f/u concerns. Awaiting TOC meds delivery.

## 2019-12-27 NOTE — Progress Notes (Addendum)
Progress Note  Patient Name: Craig West Date of Encounter: 12/27/2019  [facility] Cardiologist: Skeet Latch, MD   Subjective   Feeling well this morning. Hopeful to discharge home.  Inpatient Medications    Scheduled Meds: . AeroChamber Plus Flo-Vu Large  1 each Other Once  . amLODipine  10 mg Oral Daily  . carvedilol  12.5 mg Oral BID WC  . furosemide  40 mg Oral Daily  . heparin  5,000 Units Subcutaneous Q8H  . hydrALAZINE  50 mg Oral Q12H  . insulin aspart  0-9 Units Subcutaneous TID WC  . isosorbide mononitrate  60 mg Oral Daily   Continuous Infusions:  PRN Meds: acetaminophen, albuterol, docusate sodium, ondansetron (ZOFRAN) IV, polyethylene glycol   Vital Signs    Vitals:   12/26/19 2042 12/27/19 0309 12/27/19 0313 12/27/19 0830  BP:   (!) 154/113 131/74  Pulse: 79     Resp: 18  20   Temp: 98.8 F (37.1 C) 98.3 F (36.8 C)  97.9 F (36.6 C)  TempSrc: Oral Oral  Oral  SpO2: 99%  99%   Weight:  105.5 kg    Height:        Intake/Output Summary (Last 24 hours) at 12/27/2019 1029 Last data filed at 12/27/2019 0815 Gross per 24 hour  Intake 1320 ml  Output 1425 ml  Net -105 ml   Last 3 Weights 12/27/2019 12/26/2019 12/25/2019  Weight (lbs) 232 lb 9.6 oz 233 lb 14.4 oz 235 lb 1.6 oz  Weight (kg) 105.507 kg 106.096 kg 106.641 kg      Telemetry    SR, prolonged QT - Personally Reviewed  ECG    No new tracing this morning  Physical Exam  Pleasant AAM, sitting up on the side of the bed GEN: No acute distress.   Neck: No JVD Cardiac: RRR, no murmurs, rubs, or gallops.  Respiratory: Clear to auscultation bilaterally. GI: Soft, nontender, non-distended  MS: No edema; No deformity. Neuro:  Nonfocal  Psych: Normal affect   Labs    High Sensitivity Troponin:   Recent Labs  Lab 12/23/19 0931 12/23/19 1309  TROPONINIHS 183* 218*      Chemistry Recent Labs  Lab 12/23/19 0506 12/24/19 0403 12/25/19 0244 12/26/19 0635 12/27/19 0833   NA 138   < > 141 138 138  K 4.5   < > 3.5 3.7 3.6  CL 102   < > 106 104 106  CO2 25   < > 24 26 26   GLUCOSE 211*   < > 101* 152* 172*  BUN 42*   < > 42* 48* 47*  CREATININE 3.54*   < > 3.51* 3.80* 3.67*  CALCIUM 9.0   < > 8.3* 8.5* 8.6*  PROT 8.5*  --   --   --   --   ALBUMIN 3.2*  --   --   --   --   AST 34  --   --   --   --   ALT 36  --   --   --   --   ALKPHOS 145*  --   --   --   --   BILITOT 1.0  --   --   --   --   GFRNONAA 18*   < > 18* 17* 17*  GFRAA 21*   < > 21* 19* 20*  ANIONGAP 11   < > 11 8 6    < > = values in this interval not  displayed.     Hematology Recent Labs  Lab 12/25/19 0244 12/26/19 0635 12/27/19 0833  WBC 12.0* 11.3* 9.5  RBC 4.05* 4.28 4.31  HGB 11.3* 12.2* 12.1*  HCT 36.0* 38.4* 38.3*  MCV 88.9 89.7 88.9  MCH 27.9 28.5 28.1  MCHC 31.4 31.8 31.6  RDW 14.1 14.2 14.1  PLT 222 235 223    BNP Recent Labs  Lab 12/23/19 0931  BNP 1,123.1*     DDimer No results for input(s): DDIMER in the last 168 hours.   Radiology    ECHOCARDIOGRAM COMPLETE  Result Date: 12/25/2019    ECHOCARDIOGRAM REPORT   Patient Name:   Craig West Date of Exam: 12/25/2019 Medical Rec #:  [id number]    Height:       73.0 in Accession #:    [id number]   Weight:       235.1 lb Date of Birth:  1966-03-14    BSA:          2.305 m Patient Age:    49 years     BP:           169/107 mmHg Patient Gender: M            HR:           74 bpm. Exam Location:  Inpatient Procedure: 2D Echo Indications:    congestive heart failure 428.0  History:        Patient has no prior history of Echocardiogram examinations.                 Chronic kidney disease; Risk Factors:Hypertension, Dyslipidemia,                 Diabetes and Current Smoker.  Sonographer:    Johny Chess Referring Phys: [id number] [place]  1. Consider infiltrative cardiomyopathy such as amyloidosis or global variant hypertrophic cardiomyopathy - LV wall thickness 1.8-1.9 cm. Left ventricular ejection fraction,  by estimation, is 60 to 65%. The left ventricle has normal function. The left ventricle has no regional wall motion abnormalities. There is severe concentric left ventricular hypertrophy. Left ventricular diastolic parameters are consistent with Grade II diastolic dysfunction (pseudonormalization). Elevated left ventricular end-diastolic pressure.  2. Right ventricular systolic function is normal. The right ventricular size is normal. Tricuspid regurgitation signal is inadequate for assessing PA pressure.  3. Left atrial size was mildly dilated.  4. The mitral valve is abnormal. Mild mitral valve regurgitation.  5. The aortic valve is tricuspid. Aortic valve regurgitation is not visualized. Mild aortic valve sclerosis is present, with no evidence of aortic valve stenosis.  6. The inferior vena cava is normal in size with greater than 50% respiratory variability, suggesting right atrial pressure of 3 mmHg. Comparison(s): No prior Echocardiogram. FINDINGS  Left Ventricle: Consider infiltrative cardiomyopathy such as amyloidosis or global variant hypertrophic cardiomyopathy - LV wall thickness 1.8-1.9 cm. Left ventricular ejection fraction, by estimation, is 60 to 65%. The left ventricle has normal function. The left ventricle has no regional wall motion abnormalities. The left ventricular internal cavity size was small. There is severe concentric left ventricular hypertrophy. Left ventricular diastolic parameters are consistent with Grade II diastolic dysfunction (pseudonormalization). Elevated left ventricular end-diastolic pressure. Right Ventricle: The right ventricular size is normal. No increase in right ventricular wall thickness. Right ventricular systolic function is normal. Tricuspid regurgitation signal is inadequate for assessing PA pressure. Left Atrium: Left atrial size was mildly dilated. Right Atrium: Right atrial size was normal in size. Pericardium:  There is no evidence of pericardial effusion. Mitral  Valve: The mitral valve is abnormal. There is moderate thickening of the mitral valve leaflet(s). Mild mitral valve regurgitation. Tricuspid Valve: The tricuspid valve is grossly normal. Tricuspid valve regurgitation is trivial. Aortic Valve: The aortic valve is tricuspid. Aortic valve regurgitation is not visualized. Mild aortic valve sclerosis is present, with no evidence of aortic valve stenosis. Pulmonic Valve: The pulmonic valve was grossly normal. Pulmonic valve regurgitation is not visualized. Aorta: The aortic root and ascending aorta are structurally normal, with no evidence of dilitation. Venous: The inferior vena cava is normal in size with greater than 50% respiratory variability, suggesting right atrial pressure of 3 mmHg. IAS/Shunts: No atrial level shunt detected by color flow Doppler.  LEFT VENTRICLE PLAX 2D LVIDd:         5.00 cm      Diastology LVIDs:         3.30 cm      LV e' medial:    5.33 cm/s LV PW:         1.90 cm      LV E/e' medial:  19.3 LV IVS:        1.80 cm      LV e' lateral:   5.44 cm/s LVOT diam:     2.20 cm      LV E/e' lateral: 18.9 LV SV:         82 LV SV Index:   36 LVOT Area:     3.80 cm  LV Volumes (MOD) LV vol d, MOD A2C: 121.0 ml LV vol d, MOD A4C: 116.0 ml LV vol s, MOD A2C: 65.9 ml LV vol s, MOD A4C: 61.2 ml LV SV MOD A2C:     55.1 ml LV SV MOD A4C:     116.0 ml LV SV MOD BP:      56.1 ml RIGHT VENTRICLE             IVC RV S prime:     12.30 cm/s  IVC diam: 1.70 cm TAPSE (M-mode): 1.7 cm LEFT ATRIUM             Index       RIGHT ATRIUM           Index LA diam:        4.50 cm 1.95 cm/m  RA Area:     15.90 cm LA Vol (A2C):   83.1 ml 36.06 ml/m RA Volume:   42.90 ml  18.61 ml/m LA Vol (A4C):   80.9 ml 35.10 ml/m LA Biplane Vol: 82.1 ml 35.62 ml/m  AORTIC VALVE LVOT Vmax:   114.00 cm/s LVOT Vmean:  80.400 cm/s LVOT VTI:    0.217 m  AORTA Ao Root diam: 3.10 cm Ao Asc diam:  3.40 cm MITRAL VALVE MV Area (PHT): 3.91 cm     SHUNTS MV Decel Time: 194 msec     Systemic VTI:   0.22 m MV E velocity: 103.00 cm/s  Systemic Diam: 2.20 cm MV A velocity: 72.00 cm/s MV E/A ratio:  1.43 Lyman Bishop MD Electronically signed by Lyman Bishop MD Signature Date/Time: 12/25/2019/12:12:14 PM    Final    VAS US RENAL ARTERY DUPLEX  Result Date: 12/26/2019 ABDOMINAL VISCERAL Indications: Hypertension High Risk Factors: Hypertension. Limitations: Air/bowel gas and obesity. Comparison Study: No prior studies. Performing Technologist: Darlin Coco, RDMS  Examination Guidelines: A complete evaluation includes B-mode imaging, spectral Doppler, color Doppler, and power Doppler as needed of all  accessible portions of each vessel. Bilateral testing is considered an integral part of a complete examination. Limited examinations for reoccurring indications may be performed as noted.  Duplex Findings: +--------------------+--------+--------+------+--------+ Mesenteric          PSV cm/sEDV cm/sPlaqueComments +--------------------+--------+--------+------+--------+ Aorta at SMA          122                          +--------------------+--------+--------+------+--------+ Aorta Mid             118                          +--------------------+--------+--------+------+--------+ Celiac Artery Origin  103                          +--------------------+--------+--------+------+--------+ SMA Proximal          121      30                  +--------------------+--------+--------+------+--------+ SMA Mid               151      35                  +--------------------+--------+--------+------+--------+    +------------------+--------+--------+-------+ Right Renal ArteryPSV cm/sEDV cm/sComment +------------------+--------+--------+-------+ Origin               57      10           +------------------+--------+--------+-------+ Proximal             66      15           +------------------+--------+--------+-------+ Mid                  80      28            +------------------+--------+--------+-------+ Distal               71      24           +------------------+--------+--------+-------+ +-----------------+--------+--------+-------+ Left Renal ArteryPSV cm/sEDV cm/sComment +-----------------+--------+--------+-------+ Origin              66      18           +-----------------+--------+--------+-------+ Proximal            58      18           +-----------------+--------+--------+-------+ Mid                 46      15           +-----------------+--------+--------+-------+ Distal              73      20           +-----------------+--------+--------+-------+ +------------+--------+--------+----+-----------+--------+--------+----+ Right KidneyPSV cm/sEDV cm/sRI  Left KidneyPSV cm/sEDV cm/sRI   +------------+--------+--------+----+-----------+--------+--------+----+ Upper Pole  20      9       0.53Upper Pole 21      10      0.53 +------------+--------+--------+----+-----------+--------+--------+----+ Mid         24      11      0.55Mid        32      13      0.60 +------------+--------+--------+----+-----------+--------+--------+----+  Lower Pole  20      9       0.53Lower Pole 21      8       0.61 +------------+--------+--------+----+-----------+--------+--------+----+ Hilar       106     31      0.71Hilar      42      16      0.60 +------------+--------+--------+----+-----------+--------+--------+----+ +------------------+-----+------------------+-----+ Right Kidney           Left Kidney             +------------------+-----+------------------+-----+ RAR                    RAR                     +------------------+-----+------------------+-----+ RAR (manual)      0.66 RAR (manual)      0.54  +------------------+-----+------------------+-----+ Cortex            0.52 Cortex            0.52  +------------------+-----+------------------+-----+ Cortex thickness       Corex  thickness         +------------------+-----+------------------+-----+ Kidney length (cm)10.48Kidney length (cm)11.05 +------------------+-----+------------------+-----+  Summary: Renal:  Right: Normal size right kidney. Normal right Resisitive Index. No        evidence of right renal artery stenosis. RRV flow present.        Cyst(s) noted. Left:  Normal size of left kidney. Normal left Resistive Index. No        evidence of left renal artery stenosis. LRV flow present.        Cyst(s) noted.  *See table(s) above for measurements and observations.  Diagnosing physician: Deitra Mayo MD  Electronically signed by Deitra Mayo MD on 12/26/2019 at 2:02:22 PM.    Final     Cardiac Studies   Echo 12/25/19: 1. Consider infiltrative cardiomyopathy such as amyloidosis or global  variant hypertrophic cardiomyopathy - LV wall thickness 1.8-1.9 cm. Left  ventricular ejection fraction, by estimation, is 60 to 65%. The left  ventricle has normal function. The left  ventricle has no regional wall motion abnormalities. There is severe  concentric left ventricular hypertrophy. Left ventricular diastolic  parameters are consistent with Grade II diastolic dysfunction  (pseudonormalization). Elevated left ventricular  end-diastolic pressure.  2. Right ventricular systolic function is normal. The right ventricular  size is normal. Tricuspid regurgitation signal is inadequate for assessing  PA pressure.  3. Left atrial size was mildly dilated.  4. The mitral valve is abnormal. Mild mitral valve regurgitation.  5. The aortic valve is tricuspid. Aortic valve regurgitation is not  visualized. Mild aortic valve sclerosis is present, with no evidence of  aortic valve stenosis.  6. The inferior vena cava is normal in size with greater than 50%  respiratory variability, suggesting right atrial pressure of 3 mmHg.   Patient Profile     54 y.o. male with a hx of hypertension, diabetes, and  asthmahere with hypertensive urgency and diastolic heart failure.  Assessment & Plan    1. Hypertensive urgency: Blood pressures were > 390 systolic on admission. He has had trouble getting his medications prior to admission. Regimen now includes norvasc 10mg  daily, coreg 12.5mg  BID, hydralazine 25mg  TID and Imdur 60mg  daily. Bp is much improved.  -- primary working with SW to help him obtain medications prior to discharge  2. Acute diastolic HF: BNP 3009 on admission. Has been  diuresed with IV lasix, now on 40mg  daily. Suspect this was precipitated by his blood pressure. Has been instructed on daily weights, along with low Na+ diet at discharge  3. NSVT: this has resolved. Continue BB. Consider outpatient ischemic evaluation.   4. Acute on chronic renal failure: Avoiding NSAIDs. He has been referred to a nephrologist but has been unable to afford to go.   5. Elevated troponin: in the setting of hypertensive urgency. Suspect demand ischemia as he has had no chest pain.   6. 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 Unable to undergo MRI with renal disease at this time. Plan for follow up in the hypertension clinic  For questions or updates, please contact [place] Please consult [email or web address] for contact info under        Signed, Reino Bellis, NP  12/27/2019, 10:29 AM    I have examined the patient and reviewed assessment and plan and discussed with patient.  Agree with above as stated.    BP better controlled.   He is willing to f/u with Dr. Oval Linsey in HTN clinic.  No ischemic w/u done now given renal dysfuction.  No MRI at this time either.  No ACE-I or ARB either due to CKD.  Larae Grooms

## 2019-12-27 NOTE — Progress Notes (Signed)
   Subjective:   Patient reports that he is doing well today, denies any chest pain, SOB, nausea, vomiting, or lower extremity swelling. He reports that community [facility] and wellness will see him. We disucssed that we will try to get him a 90 day supply before he leaves but that he should be able to be discharged today. Unfortunately, only able to obtain 30 day supply through [facility]. Discussed [facility] $4 list and which medications to prioritize.  Objective:  Vital signs in last 24 hours: Vitals:   12/26/19 2040 12/26/19 2042 12/27/19 0309 12/27/19 0313  BP: (!) 142/96   (!) 154/113  Pulse:  79    Resp:  18  20  Temp:  98.8 F (37.1 C) 98.3 F (36.8 C)   TempSrc:  Oral Oral   SpO2:  99%  99%  Weight:   105.5 kg   Height:        Physical Exam Constitutional: no acute distress Head: atraumatic ENT: external ears normal Eyes: EOMI Cardiovascular: regular rate and rhythm, normal heart sounds, no LE edema Pulmonary: effort normal, lungs clear to ascultation bilaterally Abdominal: flat, bowel sounds normal Skin: warm and dry Neurological: alert, no focal deficit Psychiatric: normal mood and affect  Assessment/Plan: Craig West is a 54 y.o. male with hx of HTN, HFpEF, asthma, HLD, gout, DM presenting with HTN emergency with flash pulmonary edema. Has not had his medications in some time due to cost, no insurance. BP now controlled with PO meds, diuresed well and SOB resolved.   Principal Problem:   Acute diastolic heart failure (HCC) Active Problems:   Hypertensive urgency   Acute renal failure superimposed on stage 4 chronic kidney disease ([place])   Demand ischemia (HCC)  Hypertensive urgency: Blood pressure 263/163 in ED on arrival, In setting of noncompliance to home antihypertensive medications due to cost. Resolved with nicardipine drip and now is on PO meds. BP 121/65 most recently, but fairly labile today. Secondary HTN workup includes normal TSH and renal  ultrasound. 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 Lost 18 pounds in the past 3 days after diuresis. Volume status improved with IV diuresis and blood pressure control. No JVD, no crackles on lung exam, no lower extremity edema. Echo with [id number]%, grade 2 diastolic dysfunction, concern for infiltrative cardiomyopathy. However, cardiology feels much more likely to be hypertensive in etiology. Unable to get cardiac MRI due to renal function. Further infiltrative workup deferred. -cards consulted, appreciate recs -PO Lasix 40mg  daily -f/u with advanced HTN clinic -Strict I/Os  Progression of CKD3 -Cr remained at baseline with diuresis  DM -SSI  Diet:  Renal carb modified IVF:  none VTE:  heparin Prior to Admission Living Arrangement: home Anticipated Discharge Location:  home Barriers to Discharge:  Medically stable for discharge Dispo: Anticipated discharge in approximately 0 day(s).   Andrew Au, MD 12/27/2019, 6:40 AM Pager: [phone] After 5pm on weekdays and 1pm on weekends: On Call pager [phone]

## 2019-12-28 ENCOUNTER — Telehealth: Payer: Self-pay

## 2019-12-28 NOTE — Telephone Encounter (Signed)
Transition Care Management Follow-up Telephone Call Date of discharge and from where: 12/27/2019, [facility]  Attempted to contact patient x2 # [phone] and voicemail is not set up, unable to leave a message.  Patient has appointment with Dr Margarita Rana 01/03/2020

## 2019-12-29 ENCOUNTER — Telehealth: Payer: Self-pay

## 2019-12-29 NOTE — Telephone Encounter (Signed)
Transition Care Management Unsuccessful Follow-up Telephone Call Attempt # 2   Date of discharge and from where:  12/27/2019, [facility]   Attempts:  Call placed to patient # [id number], voicemail not set up, unable to leave a message.   Patient has appointment with Dr Margarita Rana 01/03/2020.

## 2020-01-03 ENCOUNTER — Encounter: Payer: Self-pay | Admitting: Family Medicine

## 2020-01-03 ENCOUNTER — Other Ambulatory Visit: Payer: Self-pay

## 2020-01-03 ENCOUNTER — Other Ambulatory Visit: Payer: Self-pay | Admitting: Family Medicine

## 2020-01-03 ENCOUNTER — Ambulatory Visit: Payer: Self-pay | Attending: Family Medicine | Admitting: Family Medicine

## 2020-01-03 VITALS — BP 84/60 | HR 53 | Ht 73.0 in | Wt 260.4 lb

## 2020-01-03 DIAGNOSIS — R251 Tremor, unspecified: Secondary | ICD-10-CM

## 2020-01-03 DIAGNOSIS — N184 Chronic kidney disease, stage 4 (severe): Secondary | ICD-10-CM

## 2020-01-03 DIAGNOSIS — I5031 Acute diastolic (congestive) heart failure: Secondary | ICD-10-CM

## 2020-01-03 DIAGNOSIS — N179 Acute kidney failure, unspecified: Secondary | ICD-10-CM

## 2020-01-03 DIAGNOSIS — E1169 Type 2 diabetes mellitus with other specified complication: Secondary | ICD-10-CM

## 2020-01-03 DIAGNOSIS — E1122 Type 2 diabetes mellitus with diabetic chronic kidney disease: Secondary | ICD-10-CM

## 2020-01-03 DIAGNOSIS — E785 Hyperlipidemia, unspecified: Secondary | ICD-10-CM

## 2020-01-03 DIAGNOSIS — I129 Hypertensive chronic kidney disease with stage 1 through stage 4 chronic kidney disease, or unspecified chronic kidney disease: Secondary | ICD-10-CM

## 2020-01-03 LAB — GLUCOSE, POCT (MANUAL RESULT ENTRY): POC Glucose: 126 mg/dl — AB (ref 70–99)

## 2020-01-03 MED ORDER — AMLODIPINE BESYLATE 5 MG PO TABS: 5.0000 mg | ORAL_TABLET | Freq: Every day | ORAL | 3 refills | Status: DC

## 2020-01-03 MED FILL — AMLODIPINE BESYLATE 5 MG TA: 5 | 30 days supply | Qty: 30 | Fill #0

## 2020-01-03 NOTE — Patient Instructions (Signed)
Discontinue amlodipine 10 mg and commence amlodipine 5 mg

## 2020-01-03 NOTE — Progress Notes (Signed)
States that he is having a little trouble breathing.

## 2020-01-03 NOTE — Progress Notes (Signed)
Subjective:  Patient ID: Craig West, male    DOB: 04-Sep-1965  Age: 54 y.o. MRN: [id number]  CC: Diabetes   HPI Craig West is a 54 year old male with a history of hypertension, type 2 diabetes mellitus (A1c 7.6), asthma, gout,stage IV chronic kidney disease,non compliance here for follow-up at the [facility] care [facility] after hospitalization for acute CHF secondary to pulmonary edema as result of noncompliance. Echo from 12/25/2019 revealed: IMPRESSIONS    1. Consider infiltrative cardiomyopathy such as amyloidosis or global  variant hypertrophic cardiomyopathy - LV wall thickness 1.8-1.9 cm. Left  ventricular ejection fraction, by estimation, is 60 to 65%. The left  ventricle has normal function. The left  ventricle has no regional wall motion abnormalities. There is severe  concentric left ventricular hypertrophy. Left ventricular diastolic  parameters are consistent with Grade II diastolic dysfunction  (pseudonormalization). Elevated left ventricular  end-diastolic pressure.  2. Right ventricular systolic function is normal. The right ventricular  size is normal. Tricuspid regurgitation signal is inadequate for assessing  PA pressure.  3. Left atrial size was mildly dilated.  4. The mitral valve is abnormal. Mild mitral valve regurgitation.  5. The aortic valve is tricuspid. Aortic valve regurgitation is not  visualized. Mild aortic valve sclerosis is present, with no evidence of  aortic valve stenosis.  6. The inferior vena cava is normal in size with greater than 50%  respiratory variability, suggesting right atrial pressure of 3 mmHg At the moment he denies dyspnea but he has mild dyspnea  He was treated with diuretics and his medication regimen adjusted; hydrochlorothiazide/lisinopril was discontinued. His labs revealed acute on chronic kidney injury with worsening of his creatinine from a baseline of 2.4 up to 3.8.  Renal Doppler was negative for renal artery  stenosis. His condition improved with recommendation to follow-up with cardiology outpatient. He does have dyspnea on mild exertion but not at rest.  Referred to Nephrology by myself on different occasions and he never went.  Documentation in his chart reveals he no showed to his appointment in 04/2019.  Today he informs me he had an ultrasound of his kidneys during hospitalization and was told his "kidneys were good".  He has noticed some intermittent tremors but is unsure of the cause.  Past Medical History:  Diagnosis Date  . Asthma   . CHF (congestive heart failure) ([place])   . Diabetes mellitus without complication ([place])   . Gout   . Hypertension     Past Surgical History:  Procedure Laterality Date  . NO PAST SURGERIES      Family History  Problem Relation Age of Onset  . Heart failure Brother     No Known Allergies  Outpatient Medications Prior to Visit  Medication Sig Dispense Refill  . albuterol (VENTOLIN HFA) 108 (90 Base) MCG/ACT inhaler Inhale 2 puffs into the lungs every 6 (six) hours as needed for wheezing or shortness of breath. 18 g 2  . allopurinol (ZYLOPRIM) 100 MG tablet Take 2 tablets (200 mg total) by mouth daily. For prevention of gout 60 tablet 2  . amLODipine (NORVASC) 10 MG tablet Take 1 tablet (10 mg total) by mouth daily. For hypertension 30 tablet 2  . atorvastatin (LIPITOR) 40 MG tablet Take 1 tablet (40 mg total) by mouth daily. For hypercholesterolemia 30 tablet 2  . Blood Glucose Monitoring Suppl (TRUE METRIX METER) w/Device KIT Check blood sugar fasting and ant bedtime and record 1 kit 0  . carvedilol (COREG) 12.5 MG tablet Take  1 tablet (12.5 mg total) by mouth 2 (two) times daily with a meal. 60 tablet 2  . colchicine 0.6 MG tablet Take 2 tablets (1.2 mg) orally at the onset of a gout attack; may repeat 1 tablet (0.6 mg) in 2 hours if symptoms persist 30 tablet 2  . furosemide (LASIX) 40 MG tablet Take 1 tablet (40 mg total) by mouth daily. 30  tablet 2  . gabapentin (NEURONTIN) 300 MG capsule Take 1 capsule (300 mg total) by mouth 2 (two) times daily. For diabetic neuropathy 60 capsule 2  . glipiZIDE (GLUCOTROL) 10 MG tablet Take 1 tablet (10 mg total) by mouth 2 (two) times daily before a meal. 60 tablet 2  . glucose blood (TRUE METRIX BLOOD GLUCOSE TEST) test strip Use as instructed 200 each 12  . hydrALAZINE (APRESOLINE) 50 MG tablet Take 1 tablet (50 mg total) by mouth every 12 (twelve) hours. 60 tablet 2  . isosorbide mononitrate (IMDUR) 60 MG 24 hr tablet Take 1 tablet (60 mg total) by mouth daily. 30 tablet 2  . Misc. Devices (DIGITAL GLASS SCALE) MISC Use scale to weight yourself. Increasing weight may indicate fluid overload 1 each 0  . tamsulosin (FLOMAX) 0.4 MG CAPS capsule Take 1 capsule (0.4 mg total) by mouth daily. 30 capsule 2  . TRUEplus Lancets 28G MISC Check blood sugar fasting and at bedtime 210 each 2  . HYDROcodone-acetaminophen (NORCO/VICODIN) 5-325 MG tablet Take 1 tablet by mouth every 4 (four) hours as needed. (Patient not taking: Reported on 06/11/2017) 6 tablet 0   No facility-administered medications prior to visit.     ROS Review of Systems  Constitutional: Negative for activity change and appetite change.  HENT: Negative for sinus pressure and sore throat.   Eyes: Negative for visual disturbance.  Respiratory: Positive for shortness of breath. Negative for cough and chest tightness.   Cardiovascular: Negative for chest pain and leg swelling.  Gastrointestinal: Negative for abdominal distention, abdominal pain, constipation and diarrhea.  Endocrine: Negative.   Genitourinary: Negative for dysuria.  Musculoskeletal: Negative for joint swelling and myalgias.  Skin: Negative for rash.  Allergic/Immunologic: Negative.   Neurological: Positive for tremors. Negative for weakness, light-headedness and numbness.  Psychiatric/Behavioral: Negative for dysphoric mood and suicidal ideas.    Objective:  BP  (!) 84/60   Pulse (!) 53   Ht _0  (1.854 m)   Wt 260 lb 6.4 oz (118.1 kg)   SpO2 97%   BMI 34.36 kg/m   BP/Weight 01/03/2020 12/27/2019 5/45/6256  Systolic BP 84 389 373  Diastolic BP 60 92 94  Wt. (Lbs) 260.4 232.6 258  BMI 34.36 30.69 34.04      Physical Exam Constitutional:      Appearance: He is well-developed.  Neck:     Vascular: JVD present.  Cardiovascular:     Rate and Rhythm: Bradycardia present.     Heart sounds: Normal heart sounds. No murmur heard.   Pulmonary:     Effort: Pulmonary effort is normal.     Breath sounds: Normal breath sounds. No wheezing or rales.  Chest:     Chest wall: No tenderness.  Abdominal:     General: Bowel sounds are normal. There is distension.     Palpations: Abdomen is soft. There is no mass.     Tenderness: There is no abdominal tenderness.  Musculoskeletal:        General: Normal range of motion.     Right lower leg: No edema.  Left lower leg: Edema (1+ ankle edema, slightly pitting) present.  Neurological:     Mental Status: He is alert and oriented to person, place, and time.  Psychiatric:        Mood and Affect: Mood normal.     CMP Latest Ref Rng & Units 12/27/2019 12/26/2019 12/25/2019  Glucose 70 - 99 mg/dL 172(H) 152(H) 101(H)  BUN 6 - 20 mg/dL 47(H) 48(H) 42(H)  Creatinine 0.61 - 1.24 mg/dL 3.67(H) 3.80(H) 3.51(H)  Sodium 135 - 145 mmol/L 138 138 141  Potassium 3.5 - 5.1 mmol/L 3.6 3.7 3.5  Chloride 98 - 111 mmol/L 106 104 106  CO2 22 - 32 mmol/L _0 Calcium 8.9 - 10.3 mg/dL 8.6(L) 8.5(L) 8.3(L)  Total Protein 6.5 - 8.1 g/dL - - -  Total Bilirubin 0.3 - 1.2 mg/dL - - -  Alkaline Phos 38 - 126 U/L - - -  AST 15 - 41 U/L - - -  ALT 0 - 44 U/L - - -    Lipid Panel     Component Value Date/Time   CHOL 167 04/27/2019 1020   TRIG 226 (H) 04/27/2019 1020   HDL 30 (L) 04/27/2019 1020   CHOLHDL 5.6 (H) 04/27/2019 1020   CHOLHDL 5.2 (H) 10/23/2015 1009   VLDL 46 (H) 10/23/2015 1009   LDLCALC 98  04/27/2019 1020    CBC    Component Value Date/Time   WBC 9.5 12/27/2019 0833   RBC 4.31 12/27/2019 0833   HGB 12.1 (L) 12/27/2019 0833   HGB 14.5 07/11/2016 0949   HCT 38.3 (L) 12/27/2019 0833   HCT 43.0 07/11/2016 0949   PLT 223 12/27/2019 0833   PLT 223 07/11/2016 0949   MCV 88.9 12/27/2019 0833   MCV 89 07/11/2016 0949   MCH 28.1 12/27/2019 0833   MCHC 31.6 12/27/2019 0833   RDW 14.1 12/27/2019 0833   RDW 13.8 07/11/2016 0949   LYMPHSABS 2.8 12/25/2019 0244   LYMPHSABS 0.7 07/11/2016 0949   MONOABS 0.7 12/25/2019 0244   EOSABS 0.3 12/25/2019 0244   EOSABS 0.0 07/11/2016 0949   BASOSABS 0.1 12/25/2019 0244   BASOSABS 0.0 07/11/2016 0949    Lab Results  Component Value Date   HGBA1C 7.6 (H) 12/24/2019    Assessment & Plan:  1. Type 2 diabetes mellitus with stage 4 chronic kidney disease, without long-term current use of insulin (HCC) Stable with A1c of 7.6; goal is <7.0 No regimen changes as he has had some improvement Continue current regimen - POCT glucose (manual entry) - Ambulatory referral to Nephrology  2. Acute diastolic heart failure (HCC) Secondary to noncompliance and uncontrolled hypertension No evidence of acute failure at this time Continue Lasix and antihypertensive regimen Risk factor modification Cardiac diet  3. Acute renal failure superimposed on stage 4 chronic kidney disease, unspecified acute renal failure type ([place]) Combination of hypertensive and diabetic nephropathy Referred to nephrology several times and he has been noncompliant We will refer again Advised to apply for the [place] financial discount to facilitate referral - Ambulatory referral to Nephrology  4. Hyperlipidemia associated with type 2 diabetes mellitus (HCC) Stable Continue statin  5. Hypertension in chronic kidney disease due to type 2 diabetes mellitus (HCC) Blood pressure is in the hypotensive range Decreased amlodipine from 10 mg to 5 mg He has an  upcoming appointment with cardiology and his blood pressure will be checked at that visit - amLODipine (NORVASC) 5 MG tablet; Take 1 tablet (5 mg  total) by mouth daily. For hypertension  Dispense: 30 tablet; Refill: 3 - CMP14+EGFR  6. Tremor - T4, free - TSH    No orders of the defined types were placed in this encounter.   Follow-up: No follow-ups on file.       Charlott Rakes, MD, FAAFP. [facility] [place], [place]   01/03/2020, 10:11 AM

## 2020-01-04 ENCOUNTER — Other Ambulatory Visit: Payer: Self-pay | Admitting: Family Medicine

## 2020-01-04 LAB — CMP14+EGFR
ALT: 52 IU/L — ABNORMAL HIGH (ref 0–44)
AST: 32 IU/L (ref 0–40)
Albumin/Globulin Ratio: 1.1 — ABNORMAL LOW (ref 1.2–2.2)
Albumin: 3.7 g/dL — ABNORMAL LOW (ref 3.8–4.9)
Alkaline Phosphatase: 207 IU/L — ABNORMAL HIGH (ref 44–121)
BUN/Creatinine Ratio: 18 (ref 9–20)
BUN: 84 mg/dL (ref 6–24)
Bilirubin Total: 0.3 mg/dL (ref 0.0–1.2)
CO2: 17 mmol/L — ABNORMAL LOW (ref 20–29)
Calcium: 8.7 mg/dL (ref 8.7–10.2)
Chloride: 103 mmol/L (ref 96–106)
Creatinine, Ser: 4.79 mg/dL — ABNORMAL HIGH (ref 0.76–1.27)
GFR calc Af Amer: 15 mL/min/{1.73_m2} — ABNORMAL LOW (ref >59)
GFR calc non Af Amer: 13 mL/min/{1.73_m2} — ABNORMAL LOW (ref >59)
Globulin, Total: 3.4 g/dL (ref 1.5–4.5)
Glucose: 141 mg/dL — ABNORMAL HIGH (ref 65–99)
Potassium: 5.9 mmol/L — ABNORMAL HIGH (ref 3.5–5.2)
Sodium: 136 mmol/L (ref 134–144)
Total Protein: 7.1 g/dL (ref 6.0–8.5)

## 2020-01-04 LAB — TSH: TSH: 2.17 u[IU]/mL (ref 0.450–4.500)

## 2020-01-04 LAB — T4, FREE: Free T4: 1.4 ng/dL (ref 0.82–1.77)

## 2020-01-04 MED ORDER — SODIUM POLYSTYRENE SULFONATE 15 GM/60ML PO SUSP: 30.0000 g | Freq: Once | ORAL | 0 refills | Status: AC

## 2020-01-05 ENCOUNTER — Other Ambulatory Visit: Payer: Self-pay | Admitting: Family Medicine

## 2020-01-05 ENCOUNTER — Ambulatory Visit: Payer: Self-pay | Admitting: Cardiovascular Disease

## 2020-01-05 MED ORDER — SODIUM POLYSTYRENE SULFONATE 15 GM/60ML PO SUSP: 30.0000 g | Freq: Once | ORAL | 0 refills | Status: AC

## 2020-01-05 MED FILL — SPS 15 GM/60 ML SUSPENSION: 15 | 1 days supply | Qty: 120 | Fill #0

## 2020-01-06 ENCOUNTER — Emergency Department (HOSPITAL_COMMUNITY): Payer: Medicaid Other

## 2020-01-06 ENCOUNTER — Ambulatory Visit (INDEPENDENT_AMBULATORY_CARE_PROVIDER_SITE_OTHER): Payer: Self-pay | Admitting: Cardiovascular Disease

## 2020-01-06 ENCOUNTER — Encounter: Payer: Self-pay | Admitting: Cardiovascular Disease

## 2020-01-06 ENCOUNTER — Encounter (HOSPITAL_COMMUNITY): Payer: Self-pay | Admitting: Internal Medicine

## 2020-01-06 ENCOUNTER — Inpatient Hospital Stay (HOSPITAL_COMMUNITY)
Admission: EM | Admit: 2020-01-06 | Discharge: 2020-01-11 | DRG: 291 | Disposition: A | Discharge status: Home / Self Care | Payer: Medicaid Other | Attending: Internal Medicine | Admitting: Internal Medicine

## 2020-01-06 ENCOUNTER — Other Ambulatory Visit: Payer: Self-pay

## 2020-01-06 VITALS — BP 114/70 | HR 72 | Ht 73.0 in | Wt 268.0 lb

## 2020-01-06 DIAGNOSIS — D122 Benign neoplasm of ascending colon: Secondary | ICD-10-CM | POA: Diagnosis present

## 2020-01-06 DIAGNOSIS — D124 Benign neoplasm of descending colon: Secondary | ICD-10-CM | POA: Diagnosis present

## 2020-01-06 DIAGNOSIS — Z20822 Contact with and (suspected) exposure to covid-19: Secondary | ICD-10-CM | POA: Diagnosis present

## 2020-01-06 DIAGNOSIS — I5032 Chronic diastolic (congestive) heart failure: Secondary | ICD-10-CM

## 2020-01-06 DIAGNOSIS — Z79899 Other long term (current) drug therapy: Secondary | ICD-10-CM | POA: Diagnosis not present

## 2020-01-06 DIAGNOSIS — D125 Benign neoplasm of sigmoid colon: Secondary | ICD-10-CM | POA: Diagnosis present

## 2020-01-06 DIAGNOSIS — E875 Hyperkalemia: Secondary | ICD-10-CM | POA: Diagnosis present

## 2020-01-06 DIAGNOSIS — K5900 Constipation, unspecified: Secondary | ICD-10-CM | POA: Diagnosis present

## 2020-01-06 DIAGNOSIS — E669 Obesity, unspecified: Secondary | ICD-10-CM | POA: Diagnosis present

## 2020-01-06 DIAGNOSIS — D509 Iron deficiency anemia, unspecified: Secondary | ICD-10-CM | POA: Diagnosis present

## 2020-01-06 DIAGNOSIS — E8770 Fluid overload, unspecified: Secondary | ICD-10-CM | POA: Diagnosis present

## 2020-01-06 DIAGNOSIS — E1122 Type 2 diabetes mellitus with diabetic chronic kidney disease: Secondary | ICD-10-CM | POA: Diagnosis present

## 2020-01-06 DIAGNOSIS — Z7984 Long term (current) use of oral hypoglycemic drugs: Secondary | ICD-10-CM

## 2020-01-06 DIAGNOSIS — I10 Essential (primary) hypertension: Secondary | ICD-10-CM

## 2020-01-06 DIAGNOSIS — K5730 Diverticulosis of large intestine without perforation or abscess without bleeding: Secondary | ICD-10-CM | POA: Diagnosis present

## 2020-01-06 DIAGNOSIS — N179 Acute kidney failure, unspecified: Secondary | ICD-10-CM | POA: Diagnosis present

## 2020-01-06 DIAGNOSIS — C20 Malignant neoplasm of rectum: Secondary | ICD-10-CM | POA: Diagnosis present

## 2020-01-06 DIAGNOSIS — I130 Hypertensive heart and chronic kidney disease with heart failure and stage 1 through stage 4 chronic kidney disease, or unspecified chronic kidney disease: Principal | ICD-10-CM | POA: Diagnosis present

## 2020-01-06 DIAGNOSIS — E872 Acidosis: Secondary | ICD-10-CM | POA: Diagnosis present

## 2020-01-06 DIAGNOSIS — R0602 Shortness of breath: Secondary | ICD-10-CM

## 2020-01-06 DIAGNOSIS — K6289 Other specified diseases of anus and rectum: Secondary | ICD-10-CM

## 2020-01-06 DIAGNOSIS — K625 Hemorrhage of anus and rectum: Secondary | ICD-10-CM

## 2020-01-06 DIAGNOSIS — K648 Other hemorrhoids: Secondary | ICD-10-CM | POA: Diagnosis present

## 2020-01-06 DIAGNOSIS — E119 Type 2 diabetes mellitus without complications: Secondary | ICD-10-CM

## 2020-01-06 DIAGNOSIS — I5033 Acute on chronic diastolic (congestive) heart failure: Secondary | ICD-10-CM | POA: Diagnosis present

## 2020-01-06 DIAGNOSIS — N184 Chronic kidney disease, stage 4 (severe): Secondary | ICD-10-CM | POA: Diagnosis present

## 2020-01-06 DIAGNOSIS — Z9114 Patient's other noncompliance with medication regimen: Secondary | ICD-10-CM | POA: Diagnosis not present

## 2020-01-06 DIAGNOSIS — I5031 Acute diastolic (congestive) heart failure: Secondary | ICD-10-CM

## 2020-01-06 DIAGNOSIS — J45909 Unspecified asthma, uncomplicated: Secondary | ICD-10-CM | POA: Diagnosis present

## 2020-01-06 DIAGNOSIS — Z87891 Personal history of nicotine dependence: Secondary | ICD-10-CM

## 2020-01-06 DIAGNOSIS — I509 Heart failure, unspecified: Secondary | ICD-10-CM

## 2020-01-06 LAB — RESPIRATORY PANEL BY RT PCR (FLU A&B, COVID)
Influenza A by PCR: NEGATIVE
Influenza B by PCR: NEGATIVE
SARS Coronavirus 2 by RT PCR: NEGATIVE

## 2020-01-06 LAB — MAGNESIUM: Magnesium: 2.5 mg/dL — ABNORMAL HIGH (ref 1.7–2.4)

## 2020-01-06 LAB — CBC WITH DIFFERENTIAL/PLATELET
Abs Immature Granulocytes: 0.04 10*3/uL (ref 0.00–0.07)
Basophils Absolute: 0.1 10*3/uL (ref 0.0–0.1)
Basophils Relative: 1 %
Eosinophils Absolute: 0.2 10*3/uL (ref 0.0–0.5)
Eosinophils Relative: 2 %
HCT: 33.3 % — ABNORMAL LOW (ref 39.0–52.0)
Hemoglobin: 10.1 g/dL — ABNORMAL LOW (ref 13.0–17.0)
Immature Granulocytes: 0 %
Lymphocytes Relative: 15 %
Lymphs Abs: 1.6 10*3/uL (ref 0.7–4.0)
MCH: 28 pg (ref 26.0–34.0)
MCHC: 30.3 g/dL (ref 30.0–36.0)
MCV: 92.2 fL (ref 80.0–100.0)
Monocytes Absolute: 0.8 10*3/uL (ref 0.1–1.0)
Monocytes Relative: 7 %
Neutro Abs: 8.3 10*3/uL — ABNORMAL HIGH (ref 1.7–7.7)
Neutrophils Relative %: 75 %
Platelets: 207 10*3/uL (ref 150–400)
RBC: 3.61 MIL/uL — ABNORMAL LOW (ref 4.22–5.81)
RDW: 15.2 % (ref 11.5–15.5)
WBC: 11 10*3/uL — ABNORMAL HIGH (ref 4.0–10.5)
nRBC: 0 % (ref 0.0–0.2)

## 2020-01-06 LAB — BASIC METABOLIC PANEL
Anion gap: 11 (ref 5–15)
BUN: 100 mg/dL — ABNORMAL HIGH (ref 6–20)
CO2: 15 mmol/L — ABNORMAL LOW (ref 22–32)
Calcium: 8.2 mg/dL — ABNORMAL LOW (ref 8.9–10.3)
Chloride: 110 mmol/L (ref 98–111)
Creatinine, Ser: 4.92 mg/dL — ABNORMAL HIGH (ref 0.61–1.24)
GFR calc non Af Amer: 12 mL/min — ABNORMAL LOW (ref >60)
Glucose, Bld: 102 mg/dL — ABNORMAL HIGH (ref 70–99)
Potassium: 5.2 mmol/L — ABNORMAL HIGH (ref 3.5–5.1)
Sodium: 136 mmol/L (ref 135–145)

## 2020-01-06 LAB — I-STAT CHEM 8, ED
BUN: 110 mg/dL — ABNORMAL HIGH (ref 6–20)
Calcium, Ion: 1.11 mmol/L — ABNORMAL LOW (ref 1.15–1.40)
Chloride: 111 mmol/L (ref 98–111)
Creatinine, Ser: 5.6 mg/dL — ABNORMAL HIGH (ref 0.61–1.24)
Glucose, Bld: 96 mg/dL (ref 70–99)
HCT: 31 % — ABNORMAL LOW (ref 39.0–52.0)
Hemoglobin: 10.5 g/dL — ABNORMAL LOW (ref 13.0–17.0)
Potassium: 5.3 mmol/L — ABNORMAL HIGH (ref 3.5–5.1)
Sodium: 139 mmol/L (ref 135–145)
TCO2: 18 mmol/L — ABNORMAL LOW (ref 22–32)

## 2020-01-06 LAB — BRAIN NATRIURETIC PEPTIDE: B Natriuretic Peptide: 942.4 pg/mL — ABNORMAL HIGH (ref 0.0–100.0)

## 2020-01-06 LAB — CBG MONITORING, ED: Glucose-Capillary: 108 mg/dL — ABNORMAL HIGH (ref 70–99)

## 2020-01-06 LAB — PHOSPHORUS: Phosphorus: 6.5 mg/dL — ABNORMAL HIGH (ref 2.5–4.6)

## 2020-01-06 LAB — GLUCOSE, CAPILLARY: Glucose-Capillary: 107 mg/dL — ABNORMAL HIGH (ref 70–99)

## 2020-01-06 MED ORDER — ACETAMINOPHEN 325 MG PO TABS: 650.0000 mg | ORAL_TABLET | Freq: Four times a day (QID) | ORAL | Status: DC | PRN

## 2020-01-06 MED ORDER — FUROSEMIDE 10 MG/ML IJ SOLN
80.0000 mg | Freq: Once | INTRAMUSCULAR | Status: AC
  Administered 2020-01-06: 80 mg via INTRAVENOUS
  Filled 2020-01-06: qty 8

## 2020-01-06 MED ORDER — ACETAMINOPHEN 650 MG RE SUPP: 650.0000 mg | Freq: Four times a day (QID) | RECTAL | Status: DC | PRN

## 2020-01-06 MED ORDER — ATORVASTATIN CALCIUM 40 MG PO TABS
40.0000 mg | ORAL_TABLET | Freq: Every day | ORAL | Status: DC
  Administered 2020-01-07 – 2020-01-11 (×5): 40 mg via ORAL
  Filled 2020-01-06 (×3): qty 4
  Filled 2020-01-06: qty 1
  Filled 2020-01-06 (×2): qty 4

## 2020-01-06 MED ORDER — GABAPENTIN 300 MG PO CAPS
300.0000 mg | ORAL_CAPSULE | Freq: Two times a day (BID) | ORAL | Status: DC
  Administered 2020-01-06 – 2020-01-07 (×2): 300 mg via ORAL
  Filled 2020-01-06 (×2): qty 1

## 2020-01-06 MED ORDER — HEPARIN SODIUM (PORCINE) 5000 UNIT/ML IJ SOLN
5000.0000 [IU] | Freq: Three times a day (TID) | INTRAMUSCULAR | Status: DC
  Administered 2020-01-06 – 2020-01-09 (×8): 5000 [IU] via SUBCUTANEOUS
  Filled 2020-01-06 (×9): qty 1

## 2020-01-06 MED ORDER — ALBUTEROL SULFATE HFA 108 (90 BASE) MCG/ACT IN AERS
2.0000 | INHALATION_SPRAY | Freq: Four times a day (QID) | RESPIRATORY_TRACT | Status: DC | PRN
  Administered 2020-01-07: 2 via RESPIRATORY_TRACT
  Filled 2020-01-06: qty 6.7

## 2020-01-06 MED ORDER — INSULIN ASPART 100 UNIT/ML ~~LOC~~ SOLN
0.0000 [IU] | Freq: Three times a day (TID) | SUBCUTANEOUS | Status: DC
  Administered 2020-01-07: 3 [IU] via SUBCUTANEOUS
  Administered 2020-01-08 – 2020-01-11 (×5): 2 [IU] via SUBCUTANEOUS

## 2020-01-06 NOTE — ED Notes (Signed)
Transport order placed for pt

## 2020-01-06 NOTE — H&P (Signed)
Date: 01/06/2020               Patient Name:  Craig West MRN: [id number]  DOB: November 17, 1965 Age / Sex: 54 y.o., male   PCP: Charlott Rakes, MD         Medical Service: Internal Medicine Teaching Service         Attending Physician: Dr. Lucious Groves, DO    First Contact: Dr. Shon Baton Pager: [phone]  Second Contact: Dr. Gilford Rile Pager: [phone]       After Hours (After 5p/  First Contact Pager: [phone]  weekends / holidays): Second Contact Pager: [phone]   Chief Complaint: shortness of breath, orthopnea  History of Present Illness:   [name] is a 54 y/o M with hx of HFpEF, HTN, DM, and asthma presenting with chief complaint of shortness of breath, gradually worsening over the past several days and acutely while at work today. He had an appointment with cardiology today and was told to come to the ED. He has associated orthopnea and sleeps better sitting up, weight gain of 30lb, leg swelling, and abdominal distension. 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 Also endorses mildly decreased urine output over the past week. Used to urinate about 8-9 times per night but now only going abouit 3-4 times with less volume each time. He has a history of medication noncompliance due to cost, but has been taking all his medications since recent discharge. He was recently admitted from September 23 to September 27 for hypertensive emergency with pulmonary edema. Denies using any NSAIDs at home. He also displays diffuse shaking, worse recently. He denies any confusion, syncope, chest pain  Meds:  Current Outpatient Medications  Medication Instructions  . albuterol (VENTOLIN HFA) 108 (90 Base) MCG/ACT inhaler 2 puffs, Inhalation, Every 6 hours PRN  . allopurinol (ZYLOPRIM) 200 mg, Oral, Daily, For prevention of gout  . amLODipine (NORVASC) 5 mg, Oral, Daily, For hypertension  . atorvastatin (LIPITOR) 40 mg,  Oral, Daily, For hypercholesterolemia  . Blood Glucose Monitoring Suppl (TRUE METRIX METER) w/Device KIT Check blood sugar fasting and ant bedtime and record  . carvedilol (COREG) 12.5 mg, Oral, 2 times daily with meals  . colchicine 0.6 MG tablet Take 2 tablets (1.2 mg) orally at the onset of a gout attack; may repeat 1 tablet (0.6 mg) in 2 hours if symptoms persist  . furosemide (LASIX) 40 mg, Oral, Daily  . gabapentin (NEURONTIN) 300 mg, Oral, 2 times daily, For diabetic neuropathy  . glipiZIDE (GLUCOTROL) 10 mg, Oral, 2 times daily before meals  . glucose blood (TRUE METRIX BLOOD GLUCOSE TEST) test strip Use as instructed  . hydrALAZINE (APRESOLINE) 50 mg, Oral, Every 12 hours  . HYDROcodone-acetaminophen (NORCO/VICODIN) 5-325 MG tablet 1 tablet, Oral, Every 4 hours PRN  . isosorbide mononitrate (IMDUR) 60 mg, Oral, Daily  . Misc. Devices (DIGITAL GLASS SCALE) MISC Use scale to weight yourself. Increasing weight may indicate fluid overload  . tamsulosin (FLOMAX) 0.4 mg, Oral, Daily  . TRUEplus Lancets 28G MISC Check blood sugar fasting and at bedtime    Allergies: Allergies as of 01/06/2020  . (No Known Allergies)   Past Medical History:  Diagnosis Date  . Asthma   . CHF (congestive heart failure) ([place]   . Diabetes mellitus without complication ([place]   . Gout   . Hypertension     Family  History:  Family History  Problem Relation Age of Onset  . Heart failure Brother     Social History:  Social History   Tobacco Use  . Smoking status: Former Smoker    Packs/day: 0.50    Types: Cigarettes    Quit date: 10/27/2019    Years since quitting: 0.1  . Smokeless tobacco: Never Used  Vaping Use  . Vaping Use: Never used  Substance Use Topics  . Alcohol use: Yes    Alcohol/week: 0.0 standard drinks    Comment: occ  . 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 Has been in and out of jail, last several years ago. Lives in [place] with 2 roommates. Independent  in ADLs.  Review of Systems: Review of Systems  Constitutional: Negative for chills, fever and malaise/fatigue.  HENT: Negative for congestion and sore throat.   Eyes: Negative for blurred vision and double vision.  Respiratory: Positive for shortness of breath. Negative for cough (baseline).   Cardiovascular: Positive for orthopnea and leg swelling. Negative for chest pain, palpitations and claudication.  Gastrointestinal: Negative for abdominal pain, constipation, diarrhea, nausea and vomiting.  Genitourinary: Negative for dysuria and urgency.  Musculoskeletal: Negative for back pain and myalgias.  Skin: Negative for itching and rash.  Neurological: Negative for dizziness, seizures, loss of consciousness, weakness and headaches.     Physical Exam: Physical Exam Vitals and nursing note reviewed.  Constitutional:      General: He is not in acute distress.    Appearance: He is well-developed.  HENT:     Head: Normocephalic and atraumatic.     Mouth/Throat:     Mouth: Mucous membranes are moist.  Eyes:     Extraocular Movements: Extraocular movements intact.  Neck:     Vascular: JVD (moderate) present.  Cardiovascular:     Rate and Rhythm: Normal rate and regular rhythm.  Pulmonary:     Effort: Pulmonary effort is normal.     Comments: Decreased breath sounds, no rales detected Chest:     Chest wall: Edema (RLE 2+ edema toknee, LLE 1+ edema to knee) present.  Abdominal:     Palpations: Abdomen is soft.     Tenderness: There is no abdominal tenderness. There is no guarding or rebound.     Comments: distended  Musculoskeletal:        General: Normal range of motion.     Cervical back: Normal range of motion.     Right lower leg: No tenderness.     Left lower leg: No tenderness.  Skin:    General: Skin is warm and dry.  Neurological:     General: No focal deficit present.     Mental Status: He is alert.  Psychiatric:        Mood and Affect: Mood normal.         Behavior: Behavior normal.     EKG: personally reviewed my interpretation is old RBBB, sinus rhythm  CXR: personally reviewed my interpretation is mild vascular congestion consistent with pulmonary edema, no pleural effusion, no focal consolidation  Assessment & Plan by Problem: Principal Problem:   Volume overload Active Problems:   Type 2 diabetes mellitus without complication (HCC)   Essential hypertension   Asthma   Acute renal failure superimposed on stage 4 chronic kidney disease (HCC)   Kenn Rekowski is a 54 y.o. year old male with hx of HFpEF, HTN, DM, and asthma presenting for SOB with volume overload due  to HFpEF exacerbation, AKI on CKD likely also contributing. Feeling better since starting IV Lasix, but still volume up on exam.  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 Has had multiple referrals to outpatient nephrology in the past, but  was unable to see them due to cost. At last admission had progression of CKD to stage 4 vs AKI. Now presents with continued worsening of creatinine to 5.6, BUN of 110. Mild hyperkalemia of 5.3.       Echo in September 2021 with LVEF 95-07%, grade 2 diastolic dysfunction, read as concern for infiltrative cardiomyopathy. However, cardiology felt much more likely to be hypertensive in etiology and recommended indefinite deferral of infiltrative workup. Unable to get cardiac MRI due to renal function. Has been complaint with home Lasix PO 25m daily.      Weight up 30lb since recent discharge. Currently still volume overloaded on exam, though states that he feels better. Roughly 500cc of urine output at this time, Lasix 854mIV administered roughly 3 hours ago.   -Lasix 8052mV administered, monitor urine output -holding home beta blocker (Coreg 12.5mg60mice a day) -consult nephro in AM -strict I&Os -daily weights -avoid nephrotoxins  HTN Recent  admission for severe symptomatic hypertension in the setting of medication non-complaince due to cost. Has since been taking his medications daily and has had controlled BP recently. Holding home BP meds at this time due to normotension. -resume home BP meds as needed (amlodipine 10mg80mreg 12.5mg B37m hydralazine 50mg B66mImdur 50mg da18m  DM Last A1c of 7.6 on 9/24. At that time had been off medications for several months. Resumed glipizide recently. -SSI  Asthma Notes that his home ventolin is not working as well as the inhaler he had at last admission. On chart review, only seems to have gotten ventolin at last admission. Possibly seems less effective due to volume overload. -Ventolin prn  Difficulty affording medications Has about 3 weeks more of his chronic meds [facility]. Has a job now and should be able to afford meds from $4 list. -try to choose affordable medications, [facility] expected length of stay greater than 2 midnights.  Signed: Lynzi Meulemans, JoAndrew Au7/2021, 7:57 PM  Pager: [phone] on weekdays and 1pm on weekends: On Call pager: [phone]

## 2020-01-06 NOTE — ED Notes (Signed)
Pt removed 2L [place], O2 sats maintained above 94%

## 2020-01-06 NOTE — ED Triage Notes (Signed)
Pt arrived via EMS from Doctors office for SOB and weight gain of 38 lbs over the weekend. Pt has dry cough. Pt denies pain nausea or vomit.

## 2020-01-06 NOTE — ED Provider Notes (Signed)
[place] EMERGENCY DEPARTMENT Provider Note   CSN: [id number] Arrival date & time: 01/06/20  1610     History Chief Complaint  Patient presents with  . Shortness of Breath    Craig West is a 54 y.o. male.  Patient is a 54 year old male with a history of CHF, diabetes, hypertension and asthma who presents with shortness of breath.  He was recently admitted from September 23 to September 27 for hypertensive emergency with pulmonary edema.  His blood pressure was controlled while he was in the hospital and he was diuresed.  He went to his cardiology office today because he has had some worsening shortness of breath over the last 2 to 3 days.  He reports some weight gain although he cannot tell me exactly how much.  He feels like his abdomen is bloated and is having some increased leg swelling.  He was noted to have a rising creatinine from recent blood work and there is concern that he is going into renal failure.  He was sent here for further treatment and likely admission.  He denies any associated chest pain.  He has a bit of a dry cough.  No known fevers.  No vomiting or diarrhea.  He was found to be hypoxic by EMS although it is unclear what his oxygenation level was.  He was placed on nasal cannula which he is currently on.        Past Medical History:  Diagnosis Date  . Asthma   . CHF (congestive heart failure) ([place])   . Diabetes mellitus without complication ([place])   . Gout   . Hypertension     Patient Active Problem List   Diagnosis Date Noted  . Acute diastolic heart failure ([place])   . Acute renal failure superimposed on stage 4 chronic kidney disease ([place])   . Demand ischemia ([place])   . Hypertensive urgency 12/23/2019  . Obesity (BMI 30.0-34.9) 12/09/2017  . Non compliance w medication regimen 04/18/2017  . Gout 08/02/2016  . Diabetic neuropathy ([place]) 08/02/2016  . CKD (chronic kidney disease), stage V ([place]) 08/02/2016  . Hyperlipidemia  10/24/2015  . Asthma 10/16/2015  . Type 2 diabetes mellitus without complication ([place]) 54/00/8676  . Essential hypertension 06/13/2014  . Smoker 06/13/2014    Past Surgical History:  Procedure Laterality Date  . NO PAST SURGERIES         Family History  Problem Relation Age of Onset  . Heart failure Brother     Social History   Tobacco Use  . Smoking status: Former Smoker    Packs/day: 0.50    Types: Cigarettes    Quit date: 10/27/2019    Years since quitting: 0.1  . Smokeless tobacco: Never Used  Vaping Use  . Vaping Use: Never used  Substance Use Topics  . Alcohol use: Yes    Alcohol/week: 0.0 standard drinks    Comment: occ  . Drug use: No    Home Medications Prior to Admission medications   Medication Sig Start Date End Date Taking? Authorizing Provider  albuterol (VENTOLIN HFA) 108 (90 Base) MCG/ACT inhaler Inhale 2 puffs into the lungs every 6 (six) hours as needed for wheezing or shortness of breath. 12/27/19   Andrew Au, MD  allopurinol (ZYLOPRIM) 100 MG tablet Take 2 tablets (200 mg total) by mouth daily. For prevention of gout 12/27/19   Andrew Au, MD  amLODipine (NORVASC) 5 MG tablet Take 1 tablet (5 mg total) by mouth  daily. For hypertension 01/03/20   Charlott Rakes, MD  atorvastatin (LIPITOR) 40 MG tablet Take 1 tablet (40 mg total) by mouth daily. For hypercholesterolemia 12/27/19   Andrew Au, MD  Blood Glucose Monitoring Suppl (TRUE METRIX METER) w/Device KIT Check blood sugar fasting and ant bedtime and record 05/15/17   Charlott Rakes, MD  carvedilol (COREG) 12.5 MG tablet Take 1 tablet (12.5 mg total) by mouth 2 (two) times daily with a meal. 12/27/19   Andrew Au, MD  colchicine 0.6 MG tablet Take 2 tablets (1.2 mg) orally at the onset of a gout attack; may repeat 1 tablet (0.6 mg) in 2 hours if symptoms persist 12/27/19   Andrew Au, MD  furosemide (LASIX) 40 MG tablet Take 1 tablet (40 mg total) by mouth daily. 12/27/19   Andrew Au, MD  gabapentin (NEURONTIN) 300 MG capsule Take 1 capsule (300 mg total) by mouth 2 (two) times daily. For diabetic neuropathy 12/27/19   Andrew Au, MD  glipiZIDE (GLUCOTROL) 10 MG tablet Take 1 tablet (10 mg total) by mouth 2 (two) times daily before a meal. 12/27/19   Andrew Au, MD  glucose blood (TRUE METRIX BLOOD GLUCOSE TEST) test strip Use as instructed 12/27/19   Andrew Au, MD  hydrALAZINE (APRESOLINE) 50 MG tablet Take 1 tablet (50 mg total) by mouth every 12 (twelve) hours. 12/27/19   Andrew Au, MD  HYDROcodone-acetaminophen (NORCO/VICODIN) 5-325 MG tablet Take 1 tablet by mouth every 4 (four) hours as needed. 05/25/17   Lorin Glass, PA-C  isosorbide mononitrate (IMDUR) 60 MG 24 hr tablet Take 1 tablet (60 mg total) by mouth daily. 12/27/19   Andrew Au, MD  Misc. Devices (DIGITAL GLASS SCALE) MISC Use scale to weight yourself. Increasing weight may indicate fluid overload 12/27/19   Andrew Au, MD  tamsulosin (FLOMAX) 0.4 MG CAPS capsule Take 1 capsule (0.4 mg total) by mouth daily. 12/27/19   Andrew Au, MD  TRUEplus Lancets 28G MISC Check blood sugar fasting and at bedtime 12/27/19   Andrew Au, MD    Allergies    Patient has no known allergies.  Review of Systems   Review of Systems  Constitutional: Positive for fatigue. Negative for chills, diaphoresis and fever.  HENT: Negative for congestion, rhinorrhea and sneezing.   Eyes: Negative.   Respiratory: Positive for cough and shortness of breath. Negative for chest tightness.   Cardiovascular: Positive for leg swelling. Negative for chest pain.  Gastrointestinal: Positive for abdominal distention. Negative for abdominal pain, blood in stool, diarrhea, nausea and vomiting.  Genitourinary: Negative for difficulty urinating, flank pain, frequency and hematuria.  Musculoskeletal: Negative for arthralgias and back pain.  Skin: Negative for rash.  Neurological: Negative for dizziness,  speech difficulty, weakness, numbness and headaches.    Physical Exam Updated Vital Signs BP 114/60   Pulse (!) 54   Temp (!) 97.4 F (36.3 C)   Resp (!) 21   Ht 6' 1"  (1.854 m)   Wt 117.9 kg   SpO2 99%   BMI 34.30 kg/m   Physical Exam Constitutional:      Appearance: He is well-developed. He is ill-appearing.  HENT:     Head: Normocephalic and atraumatic.  Eyes:     Pupils: Pupils are equal, round, and reactive to light.  Cardiovascular:     Rate and Rhythm: Normal rate and regular rhythm.     Heart sounds: Normal heart sounds.  Pulmonary:     Effort: Pulmonary effort is normal. Tachypnea present. No respiratory distress.     Breath sounds: Rales present. No wheezing.  Chest:     Chest wall: No tenderness.  Abdominal:     General: Bowel sounds are normal.     Palpations: Abdomen is soft.     Tenderness: There is no abdominal tenderness. There is no guarding or rebound.  Musculoskeletal:        General: Normal range of motion.     Cervical back: Normal range of motion and neck supple.     Right lower leg: Edema present.     Left lower leg: Edema (2+ pain edema bilaterally) present.  Lymphadenopathy:     Cervical: No cervical adenopathy.  Skin:    General: Skin is warm and dry.     Findings: No rash.  Neurological:     Mental Status: He is alert and oriented to person, place, and time.     ED Results / Procedures / Treatments   Labs (all labs ordered are listed, but only abnormal results are displayed) Labs Reviewed  BASIC METABOLIC PANEL - Abnormal; Notable for the following components:      Result Value   Potassium 5.2 (*)    CO2 15 (*)    Glucose, Bld 102 (*)    BUN 100 (*)    Creatinine, Ser 4.92 (*)    Calcium 8.2 (*)    GFR calc non Af Amer 12 (*)    All other components within normal limits  CBC WITH DIFFERENTIAL/PLATELET - Abnormal; Notable for the following components:   WBC 11.0 (*)    RBC 3.61 (*)    Hemoglobin 10.1 (*)    HCT 33.3 (*)     Neutro Abs 8.3 (*)    All other components within normal limits  I-STAT CHEM 8, ED - Abnormal; Notable for the following components:   Potassium 5.3 (*)    BUN 110 (*)    Creatinine, Ser 5.60 (*)    Calcium, Ion 1.11 (*)    TCO2 18 (*)    Hemoglobin 10.5 (*)    HCT 31.0 (*)    All other components within normal limits  CBG MONITORING, ED - Abnormal; Notable for the following components:   Glucose-Capillary 108 (*)    All other components within normal limits  RESPIRATORY PANEL BY RT PCR (FLU A&B, COVID)    EKG EKG Interpretation  Date/Time:  Thursday January 06 2020 16:14:23 EDT Ventricular Rate:  54 PR Interval:    QRS Duration: 152 QT Interval:  473 QTC Calculation: 449 R Axis:   -29 Text Interpretation: Sinus rhythm IVCD, consider atypical RBBB RBBB seen on prior EKG, more bradycardic, but otherwise similar to prior EKG Confirmed by Malvin Johns ([id number]) on 01/06/2020 4:37:27 PM   Radiology DG Chest Port 1 View  Result Date: 01/06/2020 CLINICAL DATA:  54 year old male with shortness of breath and bilateral lower extremity swelling. EXAM: PORTABLE CHEST 1 VIEW COMPARISON:  Chest radiograph dated 12/25/2019. FINDINGS: There is mild cardiomegaly with mild central vascular congestion. No edema. No focal consolidation, pleural effusion, or pneumothorax. No acute osseous pathology. IMPRESSION: 1. No focal consolidation or edema. 2. Mild cardiomegaly with probable mild central vascular congestion. Electronically Signed   By: Anner Crete M.D.   On: 01/06/2020 16:59    Procedures Procedures (including critical care time)  Medications Ordered in ED Medications  furosemide (LASIX) injection 80 mg (80 mg Intravenous Given 01/06/20 1808)  ED Course  I have reviewed the triage vital signs and the nursing notes.  Pertinent labs & imaging results that were available during my care of the patient were reviewed by me and considered in my medical decision making (see chart for  details).    MDM Rules/Calculators/A&P                          Patient is a 54 year old male who presents with shortness of breath and signs of fluid overload.  His chest x-ray shows some vascular congestion.  He is mildly tachypneic.  He was reportedly hypoxic with EMS but is not currently hypoxic.  He does get short of breath with very minimal exertion however.  He also has some worsening kidney function with a creatinine today of 4.92.  His potassium is very minimally elevated at 5.2.  He does not have any significant EKG changes.  He was given dose of Lasix in the ED.  I spoke with the internal medicine teaching service to admit the patient for further treatment. Final Clinical Impression(s) / ED Diagnoses Final diagnoses:  SOB (shortness of breath)  Acute on chronic congestive heart failure, unspecified heart failure type ([place])  Acute renal failure, unspecified acute renal failure type ([place])  Hyperkalemia    Rx / DC Orders ED Discharge Orders    None       Malvin Johns, MD 01/06/20 1840

## 2020-01-06 NOTE — Progress Notes (Signed)
Hypertension Clinic Initial Assessment:    Date:  01/06/2020   ID:  Craig West, DOB 01/10/1966, MRN [id number]  PCP:  Charlott Rakes, MD  Cardiologist:  Skeet Latch, MD  Nephrologist:  Referring MD: Charlott Rakes, MD   CC: Hypertension  History of Present Illness:    Craig West is a 54 y.o. male with a hx of hypertension, diabetes, and asthma  here to establish care in the hypertension clinic.   Craig West reports having hypertension for at least 2 years.  He has struggled to afford his medications due to lack of insurance and lack of income.  He was admitted to the hospital 12/2019 with hypertensive emergency.  He presented to the hospital with subacute onset of shortness of breath and edema.  He reported several nights of orthopnea.  He initially thought that his edema was attributable to gout.  In the ED his blood pressure was 263/163.  He was given labetalol and IV hydralazine.  His home antihypertensives were subsequently resumed (amlodipine, hydrochlorothiazide, and lisinopril).  An echocardiogram was obtained that revealed LVEF 60 to 65% with severe LVH.  Left ventricular wall thickness was 1.8 to 1.9 cm.  Cardiology was consulted due to concern for possible infiltrative cardiomyopathy.  He denies any palpitations and has no family history of sudden cardiac death.  There were some short episodes of NSVT.  High-sensitivity troponin was elevated to 218.  His home lisinopril and hydrochlorothiazide were discontinued due to his CKD 4.  He was diuresed with IV Lasix and this was switched to oral at discharge.  He was started on carvedilol and hydralazine.  His weight at the time of discharge was 232 pounds.  Craig West saw his PCP on 10/4.  At that time he was hypotensive to 84/60.  Amlodipine was reduced to 5 mg.  His weight had increased to 260 pounds.  Labs were obtained and BUN was 84 with a creatinine increased to 4.79.  Potassium was 5.9.  He notes that he has been extremely  short of breath.  Last night he was unable to sleep and had orthopnea and PND.  He notes that his legs are swollen.  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  He does not seem to understand.  He denies any confusion.  He notes some chest pressure.  He notes that his urine output has decreased recently.   Past Medical History:  Diagnosis Date   Asthma    CHF (congestive heart failure) ([place])    Diabetes mellitus without complication ([place])    Gout    Hypertension     Past Surgical History:  Procedure Laterality Date   NO PAST SURGERIES      Current Medications: Current Meds  Medication Sig   albuterol (VENTOLIN HFA) 108 (90 Base) MCG/ACT inhaler Inhale 2 puffs into the lungs every 6 (six) hours as needed for wheezing or shortness of breath.   allopurinol (ZYLOPRIM) 100 MG tablet Take 2 tablets (200 mg total) by mouth daily. For prevention of gout   amLODipine (NORVASC) 5 MG tablet Take 1 tablet (5 mg total) by mouth daily. For hypertension   atorvastatin (LIPITOR) 40 MG tablet Take 1 tablet (40 mg total) by mouth daily. For hypercholesterolemia   Blood Glucose Monitoring Suppl (TRUE METRIX METER) w/Device KIT Check blood sugar fasting and ant bedtime and record   carvedilol (COREG) 12.5 MG tablet  Take 1 tablet (12.5 mg total) by mouth 2 (two) times daily with a meal.   colchicine 0.6 MG tablet Take 2 tablets (1.2 mg) orally at the onset of a gout attack; may repeat 1 tablet (0.6 mg) in 2 hours if symptoms persist   furosemide (LASIX) 40 MG tablet Take 1 tablet (40 mg total) by mouth daily.   gabapentin (NEURONTIN) 300 MG capsule Take 1 capsule (300 mg total) by mouth 2 (two) times daily. For diabetic neuropathy   glipiZIDE (GLUCOTROL) 10 MG tablet Take 1 tablet (10 mg total) by mouth 2 (two) times daily before a meal.   glucose blood (TRUE METRIX BLOOD GLUCOSE TEST) test strip Use as  instructed   hydrALAZINE (APRESOLINE) 50 MG tablet Take 1 tablet (50 mg total) by mouth every 12 (twelve) hours.   HYDROcodone-acetaminophen (NORCO/VICODIN) 5-325 MG tablet Take 1 tablet by mouth every 4 (four) hours as needed.   isosorbide mononitrate (IMDUR) 60 MG 24 hr tablet Take 1 tablet (60 mg total) by mouth daily.   Misc. Devices (DIGITAL GLASS SCALE) MISC Use scale to weight yourself. Increasing weight may indicate fluid overload   tamsulosin (FLOMAX) 0.4 MG CAPS capsule Take 1 capsule (0.4 mg total) by mouth daily.   TRUEplus Lancets 28G MISC Check blood sugar fasting and at bedtime     Allergies:   Patient has no known allergies.   Social History   Socioeconomic History   Marital status: Single    Spouse name: Not on file   Number of children: Not on file   Years of education: Not on file   Highest education level: Not on file  Occupational History   Not on file  Tobacco Use   Smoking status: Former Smoker    Packs/day: 0.50    Types: Cigarettes    Quit date: 10/27/2019    Years since quitting: 0.1   Smokeless tobacco: Never Used  Vaping Use   Vaping Use: Never used  Substance and Sexual Activity   Alcohol use: Yes    Alcohol/week: 0.0 standard drinks    Comment: occ   Drug use: No   Sexual activity: Not on file  Other Topics Concern   Not on file  Social History Narrative   Not on file   Social Determinants of Health   Financial Resource Strain: Medium Risk   Difficulty of Paying Living Expenses: Somewhat hard  Food Insecurity: No Food Insecurity   Worried About Running Out of Food in the Last Year: Never true   Ran Out of Food in the Last Year: Never true  Transportation Needs: No Transportation Needs   Lack of Transportation (Medical): No   Lack of Transportation (Non-Medical): No  Physical Activity: Unknown   Days of Exercise per Week: 0 days   Minutes of Exercise per Session: Not on file  Stress: Stress Concern Present    Feeling of Stress : To some extent  Social Connections:    Frequency of Communication with Friends and Family: Not on file   Frequency of Social Gatherings with Friends and Family: Not on file   Attends Religious Services: Not on file   Active Member of Clubs or Organizations: Not on file   Attends Archivist Meetings: Not on file   Marital Status: Not on file     Family History: The patient's family history includes Heart failure in his brother.  ROS:   Please see the history of present illness.    All other systems  reviewed and are negative.  EKGs/Labs/Other Studies Reviewed:    EKG:  EKG is not ordered today.    Recent Labs: 12/23/2019: B Natriuretic Peptide 1,123.1 12/26/2019: Magnesium 1.9 12/27/2019: Hemoglobin 12.1; Platelets 223 01/03/2020: ALT 52; BUN 84; Creatinine, Ser 4.79; Potassium 5.9; Sodium 136; TSH 2.170   Recent Lipid Panel    Component Value Date/Time   CHOL 167 04/27/2019 1020   TRIG 226 (H) 04/27/2019 1020   HDL 30 (L) 04/27/2019 1020   CHOLHDL 5.6 (H) 04/27/2019 1020   CHOLHDL 5.2 (H) 10/23/2015 1009   VLDL 46 (H) 10/23/2015 1009   LDLCALC 98 04/27/2019 1020    Physical Exam:    VS:  BP 114/70    Pulse 72    Ht _0  (1.854 m)    Wt 268 lb (121.6 kg)    SpO2 97%    BMI 35.36 kg/m  , BMI Body mass index is 35.36 kg/m. GENERAL:  Ill-appearing.  Profound respiratory distress HEENT: Pupils equal round and reactive, fundi not visualized, oral mucosa unremarkable NECK:  + jugular venous distention, waveform within normal limits, carotid upstroke brisk and symmetric, no bruits LUNGS:  Clear to auscultation bilaterally HEART:  RRR.  PMI not displaced or sustained,S1 and S2 within normal limits, no S3, no S4, no clicks, no rubs, no murmurs ABD:  Flat, positive bowel sounds normal in frequency in pitch, no bruits, no rebound, no guarding, no midline pulsatile mass, no hepatomegaly, no splenomegaly EXT:  2 plus pulses throughout, 2+ pitting  edema to above the knee, no cyanosis no clubbing SKIN:  No rashes no nodules NEURO:  Cranial nerves II through XII grossly intact, motor grossly intact throughout [facility]:  Cognitively intact, oriented to person place and time  Echo 12/25/19: 1. Consider infiltrative cardiomyopathy such as amyloidosis or global  variant hypertrophic cardiomyopathy - LV wall thickness 1.8-1.9 cm. Left  ventricular ejection fraction, by estimation, is 60 to 65%. The left  ventricle has normal function. The left  ventricle has no regional wall motion abnormalities. There is severe  concentric left ventricular hypertrophy. Left ventricular diastolic  parameters are consistent with Grade II diastolic dysfunction  (pseudonormalization). Elevated left ventricular  end-diastolic pressure.  2. Right ventricular systolic function is normal. The right ventricular  size is normal. Tricuspid regurgitation signal is inadequate for assessing  PA pressure.  3. Left atrial size was mildly dilated.  4. The mitral valve is abnormal. Mild mitral valve regurgitation.  5. The aortic valve is tricuspid. Aortic valve regurgitation is not  visualized. Mild aortic valve sclerosis is present, with no evidence of  aortic valve stenosis.  6. The inferior vena cava is normal in size with greater than 50%  respiratory variability, suggesting right atrial pressure of 3 mmHg.   Renal artery Doppler 12/2019: Normal bilaterally.  ASSESSMENT:    1. Acute diastolic heart failure ([place])   2. Essential hypertension     PLAN:    # CKD V: # Respiratory failure with volume overload: # Uremia:  Craig West requires admission due to his worsening renal function and volume overload.  I suspect that he will need dialysis.  I suspect that his BUN is now over 100 given that it was 84 3 days ago and his urine output has declined.  He is over 30 pounds up since discharge.  He is grossly volume overloaded.  I do not feel comfortable titrating  his oral diuretics.  We will have him taken to the ED via EMS.  I suspect his hospitalization will mostly be led by nephrology.  Cardiology will be happy to consult as needed.  # Essential hypertension:  BP is now well-controlled on his current regimen.  Continue amlodipine, carvedilol, hydralazine, and Imdur.  His regimen may need to be decreased further once he is diuresed/dialyzed.  # Acute on chronic diastolic heart failure:  # Severe LVH:  His echo was concerning for infiltrative cardiomyopathy.  However I suspect this is more related to poorly controlled hypertension.  He has significant voltage on his EKG which does not exclude infiltrative cardiomyopathy, but does make it less likely especially in the setting of severe hypertension that was poorly controlled for years.  Suspect that his current volume overload is more attributable to his worsening kidney function than his heart, though it certainly does not help.  He isn't a candidate for MRI and gadolinium 2/2 renal disease.  # NSVT: Continue carvedilol.   # Elevated troponin:  Occurred in the setting of hypertensive emergency.    He has significant SDOH needs.  Will as our Education officer, museum to consult for assistance with getting insurance and affording medications.   Disposition:    FU with MD/PharmD in 2 weeks   Medication Adjustments/Labs and Tests Ordered: Current medicines are reviewed at length with the patient today.  Concerns regarding medicines are outlined above.  No orders of the defined types were placed in this encounter.  No orders of the defined types were placed in this encounter.    Signed, Skeet Latch, MD  01/06/2020 3:28 PM    [facility]

## 2020-01-06 NOTE — ED Notes (Signed)
Pt provided subway sandwich from home & ice water per request

## 2020-01-07 DIAGNOSIS — E872 Acidosis: Secondary | ICD-10-CM

## 2020-01-07 DIAGNOSIS — R251 Tremor, unspecified: Secondary | ICD-10-CM

## 2020-01-07 DIAGNOSIS — J45909 Unspecified asthma, uncomplicated: Secondary | ICD-10-CM

## 2020-01-07 DIAGNOSIS — E1122 Type 2 diabetes mellitus with diabetic chronic kidney disease: Secondary | ICD-10-CM

## 2020-01-07 DIAGNOSIS — I509 Heart failure, unspecified: Secondary | ICD-10-CM

## 2020-01-07 DIAGNOSIS — I5031 Acute diastolic (congestive) heart failure: Secondary | ICD-10-CM

## 2020-01-07 DIAGNOSIS — R5383 Other fatigue: Secondary | ICD-10-CM

## 2020-01-07 DIAGNOSIS — E875 Hyperkalemia: Secondary | ICD-10-CM

## 2020-01-07 DIAGNOSIS — I130 Hypertensive heart and chronic kidney disease with heart failure and stage 1 through stage 4 chronic kidney disease, or unspecified chronic kidney disease: Principal | ICD-10-CM

## 2020-01-07 DIAGNOSIS — N184 Chronic kidney disease, stage 4 (severe): Secondary | ICD-10-CM

## 2020-01-07 DIAGNOSIS — I5032 Chronic diastolic (congestive) heart failure: Secondary | ICD-10-CM

## 2020-01-07 LAB — RENAL FUNCTION PANEL
Albumin: 3.4 g/dL — ABNORMAL LOW (ref 3.5–5.0)
Anion gap: 15 (ref 5–15)
BUN: 102 mg/dL — ABNORMAL HIGH (ref 6–20)
CO2: 16 mmol/L — ABNORMAL LOW (ref 22–32)
Calcium: 8.3 mg/dL — ABNORMAL LOW (ref 8.9–10.3)
Chloride: 106 mmol/L (ref 98–111)
Creatinine, Ser: 5.03 mg/dL — ABNORMAL HIGH (ref 0.61–1.24)
GFR calc non Af Amer: 12 mL/min — ABNORMAL LOW (ref >60)
Glucose, Bld: 83 mg/dL (ref 70–99)
Phosphorus: 6.2 mg/dL — ABNORMAL HIGH (ref 2.5–4.6)
Potassium: 5.2 mmol/L — ABNORMAL HIGH (ref 3.5–5.1)
Sodium: 137 mmol/L (ref 135–145)

## 2020-01-07 LAB — CBC
HCT: 34.3 % — ABNORMAL LOW (ref 39.0–52.0)
Hemoglobin: 10.8 g/dL — ABNORMAL LOW (ref 13.0–17.0)
MCH: 29.1 pg (ref 26.0–34.0)
MCHC: 31.5 g/dL (ref 30.0–36.0)
MCV: 92.5 fL (ref 80.0–100.0)
Platelets: 241 10*3/uL (ref 150–400)
RBC: 3.71 MIL/uL — ABNORMAL LOW (ref 4.22–5.81)
RDW: 15.3 % (ref 11.5–15.5)
WBC: 12.4 10*3/uL — ABNORMAL HIGH (ref 4.0–10.5)
nRBC: 0 % (ref 0.0–0.2)

## 2020-01-07 LAB — GLUCOSE, CAPILLARY
Glucose-Capillary: 84 mg/dL (ref 70–99)
Glucose-Capillary: 153 mg/dL — ABNORMAL HIGH (ref 70–99)
Glucose-Capillary: 83 mg/dL (ref 70–99)
Glucose-Capillary: 100 mg/dL — ABNORMAL HIGH (ref 70–99)

## 2020-01-07 MED ORDER — SODIUM BICARBONATE 650 MG PO TABS
1300.0000 mg | ORAL_TABLET | Freq: Two times a day (BID) | ORAL | Status: DC
  Administered 2020-01-07 – 2020-01-09 (×6): 1300 mg via ORAL
  Filled 2020-01-07 (×7): qty 2

## 2020-01-07 MED ORDER — FUROSEMIDE 10 MG/ML IJ SOLN
80.0000 mg | Freq: Two times a day (BID) | INTRAMUSCULAR | Status: DC
  Administered 2020-01-07 – 2020-01-10 (×6): 80 mg via INTRAVENOUS
  Filled 2020-01-07 (×6): qty 8

## 2020-01-07 MED ORDER — AMLODIPINE BESYLATE 5 MG PO TABS
5.0000 mg | ORAL_TABLET | Freq: Every day | ORAL | Status: DC
  Administered 2020-01-07 – 2020-01-08 (×2): 5 mg via ORAL
  Filled 2020-01-07 (×2): qty 1

## 2020-01-07 MED ORDER — FUROSEMIDE 10 MG/ML IJ SOLN
80.0000 mg | Freq: Once | INTRAMUSCULAR | Status: AC
  Administered 2020-01-07: 80 mg via INTRAVENOUS
  Filled 2020-01-07: qty 8

## 2020-01-07 MED ORDER — GABAPENTIN 100 MG PO CAPS
100.0000 mg | ORAL_CAPSULE | Freq: Every day | ORAL | Status: DC
  Administered 2020-01-08 – 2020-01-11 (×4): 100 mg via ORAL
  Filled 2020-01-07 (×4): qty 1

## 2020-01-07 NOTE — Plan of Care (Signed)
  Problem: Activity: Goal: Risk for activity intolerance will decrease Outcome: Progressing   Problem: Coping: Goal: Level of anxiety will decrease Outcome: Progressing   

## 2020-01-07 NOTE — Consult Note (Signed)
[facility]  INPATIENT CONSULTATION  Reason for Consultation: AKI on CKD, volume overload Requesting Provider: Dr.  Heber [place]  HPI: Craig West is an 54 y.o. male with HTN, DM, asthma, recent dx HFpEF + severe LVH who is currently admitted for volume overload and nephrology is consulted for evaluation and management of CKD/AKI.   Pt recently admitted [facility] 9/23-9/27 for HTN emergency, pulm edema.  Presenting BP 263/163.  Dx with HFpEF, LVH > cardiology eval felt HTN related.  No RAS on arterial duplex. Diuresed 18lbs.  Cr 3.5-3.6 during admission with recent baseline in the mid 2s (slow uptrending since at least 2011 when it was 1.5). BP at d/c 131/92 wt 105.5kg.   Returned yesterday with orthopnea, dyspnea.  Wt up to 121.2kg.  Says was compliant with meds - lasix 40 po daily at d/c. Admitted, lasix 80 IV x 1 dose last pm - UOP 1300 so far.  redosed this AM.   Feels LE edema improved since admission but not dyspnea, orthopnea.  Albuterol doesn't help.  No fevers, chills. Says eats low salt but drinks a lot - soda, 1 beer/day.  No difficulty voiding. No NSAIDs.  Tremor for a few weeks.  No dysgeusia, confusion, sleepiness.  PMH: Past Medical History:  Diagnosis Date  . Asthma   . CHF (congestive heart failure) ([place])   . Diabetes mellitus without complication ([place])   . Gout   . Hypertension    PSH: Past Surgical History:  Procedure Laterality Date  . NO PAST SURGERIES       Past Medical History:  Diagnosis Date  . Asthma   . CHF (congestive heart failure) ([place])   . Diabetes mellitus without complication ([place])   . Gout   . Hypertension     Medications:  I have reviewed the patient's current medications.  Medications Prior to Admission  Medication Sig Dispense Refill  . albuterol (VENTOLIN HFA) 108 (90 Base) MCG/ACT inhaler Inhale 2 puffs into the lungs every 6 (six) hours as needed for wheezing or shortness of breath. 18 g 2  . allopurinol (ZYLOPRIM) 100 MG tablet  Take 200 mg by mouth daily.    Marland Kitchen amLODipine (NORVASC) 5 MG tablet Take 1 tablet (5 mg total) by mouth daily. For hypertension 30 tablet 3  . atorvastatin (LIPITOR) 40 MG tablet Take 1 tablet (40 mg total) by mouth daily. For hypercholesterolemia 30 tablet 2  . carvedilol (COREG) 12.5 MG tablet Take 1 tablet (12.5 mg total) by mouth 2 (two) times daily with a meal. 60 tablet 2  . colchicine 0.6 MG tablet Take 2 tablets (1.2 mg) orally at the onset of a gout attack; may repeat 1 tablet (0.6 mg) in 2 hours if symptoms persist (Patient taking differently: Take 0.6 mg by mouth See admin instructions. Take 2 tablets (1.2 mg) orally at the onset of a gout attack; may repeat 1 tablet (0.6 mg) in 2 hours if symptoms persist As needed for Gout flare up.) 30 tablet 2  . furosemide (LASIX) 40 MG tablet Take 1 tablet (40 mg total) by mouth daily. 30 tablet 2  . gabapentin (NEURONTIN) 300 MG capsule Take 1 capsule (300 mg total) by mouth 2 (two) times daily. For diabetic neuropathy 60 capsule 2  . glipiZIDE (GLUCOTROL) 10 MG tablet Take 1 tablet (10 mg total) by mouth 2 (two) times daily before a meal. 60 tablet 2  . hydrALAZINE (APRESOLINE) 50 MG tablet Take 1 tablet (50 mg total) by mouth every 12 (  twelve) hours. 60 tablet 2  . isosorbide mononitrate (IMDUR) 60 MG 24 hr tablet Take 1 tablet (60 mg total) by mouth daily. 30 tablet 2  . tamsulosin (FLOMAX) 0.4 MG CAPS capsule Take 1 capsule (0.4 mg total) by mouth daily. 30 capsule 2  . Blood Glucose Monitoring Suppl (TRUE METRIX METER) w/Device KIT Check blood sugar fasting and ant bedtime and record 1 kit 0  . glucose blood (TRUE METRIX BLOOD GLUCOSE TEST) test strip Use as instructed 200 each 12  . HYDROcodone-acetaminophen (NORCO/VICODIN) 5-325 MG tablet Take 1 tablet by mouth every 4 (four) hours as needed. (Patient not taking: Reported on 01/07/2020) 6 tablet 0  . Misc. Devices (DIGITAL GLASS SCALE) MISC Use scale to weight yourself. Increasing weight may  indicate fluid overload 1 each 0  . TRUEplus Lancets 28G MISC Check blood sugar fasting and at bedtime 210 each 2    ALLERGIES:  No Known Allergies  FAM HX: Family History  Problem Relation Age of Onset  . Heart failure Brother     Social History:   reports that he quit smoking about 2 months ago. His smoking use included cigarettes. He smoked 0.50 packs per day. He has never used smokeless tobacco. He reports current alcohol use. He reports that he does not use drugs.  ROS: 12 system ROS per HPI above  Blood pressure (!) 144/92, pulse 62, temperature 97.7 F (36.5 C), temperature source Oral, resp. rate (!) 22, height 6' 1"  (1.854 m), weight 121.2 kg, SpO2 97 %. PHYSICAL EXAM: Gen: obese man with ^ WOB at rest  Eyes: anicteric ENT: MMM Neck: supple, JVD to jaw at 30 degrees CV:  RRR, no rub Abd:  Soft, obese, mildly distended, no clear fluid wave Lungs: clear to bases, ^ WOB at rest noted GU: no foley Extr:  Trace LE edema Neuro: tremor of hand, no asterixis, conversant, fully oriented Skin: warm and dry   Results for orders placed or performed during the hospital encounter of 01/06/20 (from the past 48 hour(s))  CBG monitoring, ED     Status: Abnormal   Collection Time: 01/06/20  4:24 PM  Result Value Ref Range   Glucose-Capillary 108 (H) 70 - 99 mg/dL    Comment: Glucose reference range applies only to samples taken after fasting for at least 8 hours.  Basic metabolic panel     Status: Abnormal   Collection Time: 01/06/20  5:25 PM  Result Value Ref Range   Sodium 136 135 - 145 mmol/L   Potassium 5.2 (H) 3.5 - 5.1 mmol/L   Chloride 110 98 - 111 mmol/L   CO2 15 (L) 22 - 32 mmol/L   Glucose, Bld 102 (H) 70 - 99 mg/dL    Comment: Glucose reference range applies only to samples taken after fasting for at least 8 hours.   BUN 100 (H) 6 - 20 mg/dL   Creatinine, Ser 4.92 (H) 0.61 - 1.24 mg/dL   Calcium 8.2 (L) 8.9 - 10.3 mg/dL   GFR calc non Af Amer 12 (L) >60 mL/min    Anion gap 11 5 - 15    Comment: Performed at [facility] [address], [place], [place] [address]  CBC with Differential     Status: Abnormal   Collection Time: 01/06/20  5:25 PM  Result Value Ref Range   WBC 11.0 (H) 4.0 - 10.5 K/uL   RBC 3.61 (L) 4.22 - 5.81 MIL/uL   Hemoglobin 10.1 (L) 13.0 - 17.0  g/dL   HCT 33.3 (L) 39 - 52 %   MCV 92.2 80.0 - 100.0 fL   MCH 28.0 26.0 - 34.0 pg   MCHC 30.3 30.0 - 36.0 g/dL   RDW 15.2 11.5 - 15.5 %   Platelets 207 150 - 400 K/uL   nRBC 0.0 0.0 - 0.2 %   Neutrophils Relative % 75 %   Neutro Abs 8.3 (H) 1.7 - 7.7 K/uL   Lymphocytes Relative 15 %   Lymphs Abs 1.6 0.7 - 4.0 K/uL   Monocytes Relative 7 %   Monocytes Absolute 0.8 0.1 - 1.0 K/uL   Eosinophils Relative 2 %   Eosinophils Absolute 0.2 0 - 0 K/uL   Basophils Relative 1 %   Basophils Absolute 0.1 0 - 0 K/uL   Immature Granulocytes 0 %   Abs Immature Granulocytes 0.04 0.00 - 0.07 K/uL    Comment: Performed at [facility] [address] [place], [place] [address]  I-stat chem 8, ED (not at [facility] or [facility])     Status: Abnormal   Collection Time: 01/06/20  5:37 PM  Result Value Ref Range   Sodium 139 135 - 145 mmol/L   Potassium 5.3 (H) 3.5 - 5.1 mmol/L   Chloride 111 98 - 111 mmol/L   BUN 110 (H) 6 - 20 mg/dL   Creatinine, Ser 5.60 (H) 0.61 - 1.24 mg/dL   Glucose, Bld 96 70 - 99 mg/dL    Comment: Glucose reference range applies only to samples taken after fasting for at least 8 hours.   Calcium, Ion 1.11 (L) 1.15 - 1.40 mmol/L   TCO2 18 (L) 22 - 32 mmol/L   Hemoglobin 10.5 (L) 13.0 - 17.0 g/dL   HCT 31.0 (L) 39 - 52 %  Respiratory Panel by RT PCR (Flu A&B, Covid) - Nasopharyngeal Swab     Status: None   Collection Time: 01/06/20  6:22 PM   Specimen: Nasopharyngeal Swab  Result Value Ref Range   SARS Coronavirus 2 by RT PCR NEGATIVE NEGATIVE    Comment: (NOTE) SARS-CoV-2 target nucleic acids are NOT DETECTED.  The SARS-CoV-2 RNA is generally detectable in upper  respiratoy specimens during the acute phase of infection. The lowest concentration of SARS-CoV-2 viral copies this assay can detect is 131 copies/mL. A negative result does not preclude SARS-Cov-2 infection and should not be used as the sole basis for treatment or other patient management decisions. A negative result may occur with  improper specimen collection/handling, submission of specimen other than nasopharyngeal swab, presence of viral mutation(s) within the areas targeted by this assay, and inadequate number of viral copies (<131 copies/mL). A negative result must be combined with clinical observations, patient history, and epidemiological information. The expected result is Negative.  Fact Sheet for Patients:  PinkCheek.be  Fact Sheet for Healthcare Providers:  GravelBags.it  This test is no t yet approved or cleared by the [place] FDA and  has been authorized for detection and/or diagnosis of SARS-CoV-2 by FDA under an Emergency Use Authorization (EUA). This EUA will remain  in effect (meaning this test can be used) for the duration of the COVID-19 declaration under Section 564(b)(1) of the Act, 21 U.S.C. section 360bbb-3(b)(1), unless the authorization is terminated or revoked sooner.     Influenza A by PCR NEGATIVE NEGATIVE   Influenza B by PCR NEGATIVE NEGATIVE    Comment: (NOTE) The Xpert Xpress SARS-CoV-2/FLU/RSV assay is intended as an aid in  the diagnosis of  influenza from Nasopharyngeal swab specimens and  should not be used as a sole basis for treatment. Nasal washings and  aspirates are unacceptable for Xpert Xpress SARS-CoV-2/FLU/RSV  testing.  Fact Sheet for Patients: PinkCheek.be  Fact Sheet for Healthcare Providers: GravelBags.it  This test is not yet approved or cleared by the [place] FDA and  has been authorized for  detection and/or diagnosis of SARS-CoV-2 by  FDA under an Emergency Use Authorization (EUA). This EUA will remain  in effect (meaning this test can be used) for the duration of the  Covid-19 declaration under Section 564(b)(1) of the Act, 21  U.S.C. section 360bbb-3(b)(1), unless the authorization is  terminated or revoked. Performed at [facility], [place] [address], [place], [place] [address]   Glucose, capillary     Status: Abnormal   Collection Time: 01/06/20 10:05 PM  Result Value Ref Range   Glucose-Capillary 107 (H) 70 - 99 mg/dL    Comment: Glucose reference range applies only to samples taken after fasting for at least 8 hours.  Magnesium     Status: Abnormal   Collection Time: 01/06/20 10:26 PM  Result Value Ref Range   Magnesium 2.5 (H) 1.7 - 2.4 mg/dL    Comment: Performed at [facility] [address] [place], [place] [address]  Phosphorus     Status: Abnormal   Collection Time: 01/06/20 10:26 PM  Result Value Ref Range   Phosphorus 6.5 (H) 2.5 - 4.6 mg/dL    Comment: Performed at [facility] [address], [place], [place] [address]  Brain natriuretic peptide     Status: Abnormal   Collection Time: 01/06/20 10:26 PM  Result Value Ref Range   B Natriuretic Peptide 942.4 (H) 0.0 - 100.0 pg/mL    Comment: Performed at [facility] [address], [place], [place] [address]  CBC     Status: Abnormal   Collection Time: 01/07/20  2:12 AM  Result Value Ref Range   WBC 12.4 (H) 4.0 - 10.5 K/uL   RBC 3.71 (L) 4.22 - 5.81 MIL/uL   Hemoglobin 10.8 (L) 13.0 - 17.0 g/dL   HCT 34.3 (L) 39 - 52 %   MCV 92.5 80.0 - 100.0 fL   MCH 29.1 26.0 - 34.0 pg   MCHC 31.5 30.0 - 36.0 g/dL   RDW 15.3 11.5 - 15.5 %   Platelets 241 150 - 400 K/uL   nRBC 0.0 0.0 - 0.2 %    Comment: Performed at [facility], [place] [address], [place], [place] [address]  Renal function panel     Status: Abnormal   Collection Time: 01/07/20  2:12 AM  Result Value Ref Range    Sodium 137 135 - 145 mmol/L   Potassium 5.2 (H) 3.5 - 5.1 mmol/L   Chloride 106 98 - 111 mmol/L   CO2 16 (L) 22 - 32 mmol/L   Glucose, Bld 83 70 - 99 mg/dL    Comment: Glucose reference range applies only to samples taken after fasting for at least 8 hours.   BUN 102 (H) 6 - 20 mg/dL   Creatinine, Ser 5.03 (H) 0.61 - 1.24 mg/dL   Calcium 8.3 (L) 8.9 - 10.3 mg/dL   Phosphorus 6.2 (H) 2.5 - 4.6 mg/dL   Albumin 3.4 (L) 3.5 - 5.0 g/dL   GFR calc non Af Amer 12 (L) >60 mL/min   Anion gap 15 5 - 15    Comment:  Performed at [facility], [place] [address], [place], [place] [address]  Glucose, capillary     Status: None   Collection Time: 01/07/20  6:44 AM  Result Value Ref Range   Glucose-Capillary 84 70 - 99 mg/dL    Comment: Glucose reference range applies only to samples taken after fasting for at least 8 hours.    DG Chest Port 1 View  Result Date: 01/06/2020 CLINICAL DATA:  54 year old male with shortness of breath and bilateral lower extremity swelling. EXAM: PORTABLE CHEST 1 VIEW COMPARISON:  Chest radiograph dated 12/25/2019. FINDINGS: There is mild cardiomegaly with mild central vascular congestion. No edema. No focal consolidation, pleural effusion, or pneumothorax. No acute osseous pathology. IMPRESSION: 1. No focal consolidation or edema. 2. Mild cardiomegaly with probable mild central vascular congestion. Electronically Signed   By: Anner Crete M.D.   On: 01/06/2020 16:59    Assessment/Plan  **Acute on chronic HFpEF:  Recently dx in 12/2019.  Grade 2 DD, normal EF, severe LVH - thought all HTN related.  Lasix 80 IV BID for now.  Can add metolazone for effect.   May ultimately need ultrafiltration with dialysis.   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  Prior proteinuria checks show subnephrotic proteinuria.   Renal function worse than recent admission - consider cardiorenal physiology, ischemic ATN  with wide swings in BP lately. Check renal US, UA, FeNa and FeUrea, UP/C.    No urgent indications for dialysis but will follow closely as this may arise if he's not improving or diuresing.    **Tremor: perhaps gabapentin + low GFR related --> hold today and resume tomorrow at lower dose. Doesn't look like typical uremic asterixis.   **BMM: phos 6.2, renal diet, may need binder.  Check PTH. Calcium ok.   **HTN:  Bps 130-140s currently on home meds, follow with diuresis.  Please make sure there are hold parameters to avoid hypotensive episodes that would further exacerbate AKI.   **Metabolic acidosis:  Follow, po na bicarb has been added.  **hyperkalemia: mild 5.2, should improve with diuresis.   **Anemia: mild, Hb 10.8.  Check iron indices.  No indications for ESA.   Will follow.   Justin Mend 01/07/2020, 9:47 AM

## 2020-01-07 NOTE — Progress Notes (Signed)
   Subjective:  Mr. Coffin reports feeling "a little better" this morning, though he is still not feeling like himself. He remains short of breath and fatigued. States that prior to this hospitalization, he was feeling well and had good adherence to all of the medications he was discharged with after his last hospitalization. He began noticing the fatigue, SOB, and abdominal swelling a few days ago. He also has a generalized tremor that began about the same time. Reports that he has had severe orthopnea and has had to sleep sitting up in a chair for several nights.   Objective:  Vital signs in last 24 hours: Vitals:   01/06/20 2204 01/07/20 0201 01/07/20 0604 01/07/20 1000  BP: (!) 170/94 137/73 (!) 144/92 (!) 151/93  Pulse: 66 (!) 59 62 60  Resp: (!) 22 18 (!) 22 18  Temp: 97.6 F (36.4 C) 97.7 F (36.5 C) 97.7 F (36.5 C) 97.7 F (36.5 C)  TempSrc: Oral Oral Oral Oral  SpO2: 96% 98% 97% 95%  Weight: 121.2 kg     Height:       Weight change:   Intake/Output Summary (Last 24 hours) at 01/07/2020 1221 Last data filed at 01/07/2020 1100 Gross per 24 hour  Intake 600 ml  Output 1681 ml  Net -1081 ml   Physical Exam Vitals and nursing note reviewed.  Constitutional:      Comments: Uncomfortable appearing. Nasal cannula not in place (hanging around his neck)  Cardiovascular:     Comments: JVP elevated to just below the angle of the mandible.  Pulmonary:     Comments: Decreased lung sounds and faint crackles at bases bilaterally.  Abdominal:     Comments: Abdomen distended with fluid wave. Non-tender. Bowel sounds difficult to appreciate.   Neurological:     Mental Status: He is alert.       Assessment/Plan:  Principal Problem:   Volume overload Active Problems:   Type 2 diabetes mellitus without complication (HCC)   Essential hypertension   Asthma   Acute renal failure superimposed on stage 4 chronic kidney disease ([place])  ARF on CKD IV: Acute of chronic  HFpEF: Metabolic acidosis: Hyperkalemia (5.2): CKD likely 2/2 diabetes and hypertensive nephrosclerosis. Came in 30lbs up from least admission and volume overloaded on exam. CXR with vascular congestion. Cr  5.6-->5.0 (was 3.7 on 9/27). States he is feeling better today s/p Lasix 80mg  IV x2 but still volume overloaded on exam. Diuresing well right now (-1037mL net output yesterday). Remains short of breath with no relief from albuterol. Nephrology following, appreciate their input. Don't believe he needs urgent dialysis at this time.  - Lasix 80mg  IV BID - Sodium Bicarb 1300mg  BID - Expect K to improve with diuresis.  - If diuresis slows, can add metolazone.  - Supplemental O2 as needed - Renal diet - Nephro following with the following recs:  - Check renal US, UA, FeNA, and FeUrea, UP/C.  Tremor: New onset generalized tremor for several weeks. Nephro consulted and evaluated patient. They had little concern for uremic etiology. Possibly related to gabapentin complicated by his severely decreased GFR.  - Holding gabapentin  HTN: Home meds include amlodipine 5mg ; Hydralazine 50mg  BID; and Coreg 12.5mg  BID. BPs in 140-50s/90s.  - Restart home amlodipine, hold others.  DM2: Recently restarted outpatient glipizide. Last A1c 7.6 on 9/24.  - Hold home glipizide - Moderate SSI    LOS: 1 day   Pearla Dubonnet, Medical Student 01/07/2020, 12:21 PM

## 2020-01-07 NOTE — Progress Notes (Signed)
New Admission Note:   Arrival Method: from ED via wheelchair Mental Orientation: Alert & oriented x4 Telemetry: 33m12, CCMD notified Assessment: to be completed Skin: Intact, warm and dry IV: RFA, saline locked Pain: 0/10 Tubes: None Safety Measures: Safety Fall Prevention Plan has been discussed  Admission: to be completed 5 [place] Orientation: Patient has been orientated to the room, unit and staff.   Family: none at bedside  Orders to be reviewed and implemented. Will continue to monitor the patient. Call light has been placed within reach and bed alarm has been activated.

## 2020-01-07 NOTE — Plan of Care (Signed)
  Problem: Education: Goal: Knowledge of General Education information will improve Description: Including pain rating scale, medication(s)/side effects and non-pharmacologic comfort measures Outcome: Progressing   Problem: Education: Goal: Knowledge of disease and its progression will improve Outcome: Progressing   Problem: Fluid Volume: Goal: Fluid volume balance will be maintained or improved Outcome: Progressing   Problem: Urinary Elimination: Goal: Progression of disease will be identified and treated Outcome: Progressing

## 2020-01-07 NOTE — Plan of Care (Signed)
  Problem: Education: Goal: Knowledge of General Education information will improve Description Including pain rating scale, medication(s)/side effects and non-pharmacologic comfort measures Outcome: Progressing   Problem: Health Behavior/Discharge Planning: Goal: Ability to manage health-related needs will improve Outcome: Progressing   

## 2020-01-08 ENCOUNTER — Inpatient Hospital Stay (HOSPITAL_COMMUNITY): Payer: Medicaid Other

## 2020-01-08 DIAGNOSIS — K921 Melena: Secondary | ICD-10-CM

## 2020-01-08 LAB — CBC WITH DIFFERENTIAL/PLATELET
Abs Immature Granulocytes: 0.05 10*3/uL (ref 0.00–0.07)
Basophils Absolute: 0.1 10*3/uL (ref 0.0–0.1)
Basophils Relative: 1 %
Eosinophils Absolute: 0.3 10*3/uL (ref 0.0–0.5)
Eosinophils Relative: 3 %
HCT: 32.6 % — ABNORMAL LOW (ref 39.0–52.0)
Hemoglobin: 10.2 g/dL — ABNORMAL LOW (ref 13.0–17.0)
Immature Granulocytes: 1 %
Lymphocytes Relative: 15 %
Lymphs Abs: 1.5 10*3/uL (ref 0.7–4.0)
MCH: 28.1 pg (ref 26.0–34.0)
MCHC: 31.3 g/dL (ref 30.0–36.0)
MCV: 89.8 fL (ref 80.0–100.0)
Monocytes Absolute: 0.8 10*3/uL (ref 0.1–1.0)
Monocytes Relative: 8 %
Neutro Abs: 7.7 10*3/uL (ref 1.7–7.7)
Neutrophils Relative %: 72 %
Platelets: 210 10*3/uL (ref 150–400)
RBC: 3.63 MIL/uL — ABNORMAL LOW (ref 4.22–5.81)
RDW: 15.1 % (ref 11.5–15.5)
WBC: 10.3 10*3/uL (ref 4.0–10.5)
nRBC: 0 % (ref 0.0–0.2)

## 2020-01-08 LAB — GLUCOSE, CAPILLARY
Glucose-Capillary: 101 mg/dL — ABNORMAL HIGH (ref 70–99)
Glucose-Capillary: 149 mg/dL — ABNORMAL HIGH (ref 70–99)
Glucose-Capillary: 107 mg/dL — ABNORMAL HIGH (ref 70–99)
Glucose-Capillary: 133 mg/dL — ABNORMAL HIGH (ref 70–99)

## 2020-01-08 LAB — FERRITIN: Ferritin: 81 ng/mL (ref 24–336)

## 2020-01-08 LAB — RENAL FUNCTION PANEL
Albumin: 3.1 g/dL — ABNORMAL LOW (ref 3.5–5.0)
Anion gap: 14 (ref 5–15)
BUN: 104 mg/dL — ABNORMAL HIGH (ref 6–20)
CO2: 19 mmol/L — ABNORMAL LOW (ref 22–32)
Calcium: 7.9 mg/dL — ABNORMAL LOW (ref 8.9–10.3)
Chloride: 108 mmol/L (ref 98–111)
Creatinine, Ser: 4.55 mg/dL — ABNORMAL HIGH (ref 0.61–1.24)
GFR, Estimated: 13 mL/min — ABNORMAL LOW (ref >60)
Glucose, Bld: 101 mg/dL — ABNORMAL HIGH (ref 70–99)
Phosphorus: 5.7 mg/dL — ABNORMAL HIGH (ref 2.5–4.6)
Potassium: 3.9 mmol/L (ref 3.5–5.1)
Sodium: 141 mmol/L (ref 135–145)

## 2020-01-08 LAB — IRON AND TIBC
Iron: 23 ug/dL — ABNORMAL LOW (ref 45–182)
Saturation Ratios: 7 % — ABNORMAL LOW (ref 17.9–39.5)
TIBC: 326 ug/dL (ref 250–450)
UIBC: 303 ug/dL

## 2020-01-08 MED ORDER — SODIUM CHLORIDE 0.9 % IV SOLN
250.0000 mg | Freq: Every day | INTRAVENOUS | Status: AC
  Administered 2020-01-08 – 2020-01-09 (×2): 250 mg via INTRAVENOUS
  Filled 2020-01-08 (×2): qty 20

## 2020-01-08 MED ORDER — POTASSIUM CHLORIDE CRYS ER 20 MEQ PO TBCR
20.0000 meq | EXTENDED_RELEASE_TABLET | Freq: Two times a day (BID) | ORAL | Status: AC
  Administered 2020-01-08 (×2): 20 meq via ORAL
  Filled 2020-01-08 (×2): qty 1

## 2020-01-08 NOTE — Plan of Care (Signed)
  Problem: Activity: Goal: Risk for activity intolerance will decrease Outcome: Progressing   Problem: Nutrition: Goal: Adequate nutrition will be maintained Outcome: Progressing   

## 2020-01-08 NOTE — Plan of Care (Signed)
  Problem: Education: Goal: Knowledge of General Education information will improve Description Including pain rating scale, medication(s)/side effects and non-pharmacologic comfort measures Outcome: Progressing   Problem: Health Behavior/Discharge Planning: Goal: Ability to manage health-related needs will improve Outcome: Progressing   

## 2020-01-08 NOTE — Progress Notes (Addendum)
Subjective:  Craig West was seen and evaluated at bedside this AM. Notes breathing feels like when he has asthma. Endorses  improvement in breathing overall. Discussed with patient that the process to remove fluid may take some time.   Patient also asked about showering, he states I need to take a shower and believes he is physically able.   Objective:  Vital signs in last 24 hours: Vitals:   01/07/20 1000 01/07/20 1704 01/07/20 2146 01/08/20 0459  BP: (!) 151/93 (!) 156/88 (!) 165/98 (!) 164/99  Pulse: 60 63 70 72  Resp: 18 16 19 16   Temp: 97.7 F (36.5 C) 97.6 F (36.4 C) 99.3 F (37.4 C) (!) 97.3 F (36.3 C)  TempSrc: Oral Oral Oral Oral  SpO2: 95% 97% 94% 92%  Weight:   121.2 kg   Height:       Weight change: 3.268 kg  Intake/Output Summary (Last 24 hours) at 01/08/2020 0616 Last data filed at 01/08/2020 0459 Gross per 24 hour  Intake 1442 ml  Output 3111 ml  Net -1669 ml   Physical Exam Vitals and nursing note reviewed.  Constitutional:      General: He is not in acute distress.    Appearance: Normal appearance. He is well-developed. He is not ill-appearing or toxic-appearing.  HENT:     Head: Normocephalic and atraumatic.  Eyes:     General:        Right eye: No discharge.        Left eye: No discharge.     Conjunctiva/sclera: Conjunctivae normal.  Cardiovascular:     Rate and Rhythm: Normal rate and regular rhythm.     Pulses: Normal pulses.     Heart sounds: Normal heart sounds. No murmur heard.  No friction rub. No gallop.   Pulmonary:     Effort: Pulmonary effort is normal.     Breath sounds: Normal breath sounds. No wheezing, rhonchi or rales.  Abdominal:     General: Bowel sounds are normal.     Palpations: Abdomen is soft. There is no hepatomegaly.     Tenderness: There is no abdominal tenderness. There is no guarding.     Comments: Bowel distended  Musculoskeletal:        General: No swelling.     Comments: 1+ Pitting edema up in the lower  limbs bilaterally, up to the knees.   Neurological:     General: No focal deficit present.     Mental Status: He is alert and oriented to person, place, and time.  Psychiatric:        Mood and Affect: Mood normal.        Behavior: Behavior normal.      Assessment/Plan:  Principal Problem:   Volume overload Active Problems:   Type 2 diabetes mellitus without complication (HCC)   Essential hypertension   Asthma   Acute renal failure superimposed on stage 4 chronic kidney disease (HCC)   Acute on chronic congestive heart failure (HCC)   Hyperkalemia  ARF on CKD IV: Acute of chronic HFpEF: Metabolic acidosis: Hyperkalemia (5.2): CKD likely 2/2 diabetes and hypertensive nephrosclerosis. Came in 30lbs up from least admission and volume overloaded on exam. CXR with vascular congestion. Cr  5.6-->5.0 (was 3.7 on 9/27). States he is feeling better today s/p Lasix 80mg  IV x2 but still volume overloaded on exam. Diuresing well right now (-1180net output ON) and is net negative 2.4L. Potassium improving with diuresis, 3.9 today. Remains short of breath with  no relief from albuterol. Nephrology following, appreciate their input.  - Continue Lasix 80mg  IV BID - Continue Sodium Bicarb 1300mg  BID - Expect K to improve with diuresis.  - If diuresis slows, can add metolazone.  - Supplemental O2 as needed - Renal diet - Nephro following with the following recs:  - Check renal US, UA, FeNA, and FeUrea, UP/C.  Tremor: New onset generalized tremor for several weeks. Nephro consulted and evaluated patient. They had little concern for uremic etiology. Possibly related to gabapentin complicated by his severely decreased GFR.  - Holding gabapentin  HTN: Home meds include amlodipine 5mg ; Hydralazine 50mg  BID; and Coreg 12.5mg  BID. BPs in 140-50s/90s.  - Restart home amlodipine, hold others.  DM2: Recently restarted outpatient glipizide. Last A1c 7.6 on 9/24.  - Hold home glipizide - Moderate SSI     LOS: 2 days   Maudie Mercury, MD IMTS, PGY-2 Pager: [phone] 01/08/2020,11:24 AM

## 2020-01-08 NOTE — Progress Notes (Signed)
[facility] Progress Note   Subjective:   I/Os 1.7 / 3.1, am weight pending.  Feeling some improvement.  Wants to get up to shower.  Appetite is ok.   Objective Vitals:   01/07/20 1000 01/07/20 1704 01/07/20 2146 01/08/20 0459  BP: (!) 151/93 (!) 156/88 (!) 165/98 (!) 164/99  Pulse: 60 63 70 72  Resp: 18 16 19 16   Temp: 97.7 F (36.5 C) 97.6 F (36.4 C) 99.3 F (37.4 C) (!) 97.3 F (36.3 C)  TempSrc: Oral Oral Oral Oral  SpO2: 95% 97% 94% 92%  Weight:   121.2 kg   Height:       Physical Exam Gen: obese man with ^ WOB at rest  Eyes: anicteric ENT: MMM Neck: supple, JVD to jaw at 30 degrees CV:  RRR, no rub Abd:  Soft, obese, mildly distended, no clear fluid wave Lungs: clear to bases, ^ WOB at rest noted GU: no foley Extr:  Trace LE edema Neuro: tremor of hand, no asterixis, conversant, fully oriented Skin: warm and dry   Additional Objective Labs: Basic Metabolic Panel: Recent Labs  Lab 01/06/20 1725 01/06/20 1725 01/06/20 1737 01/06/20 2226 01/07/20 0212 01/08/20 0307  NA 136   < > 139  --  137 141  K 5.2*   < > 5.3*  --  5.2* 3.9  CL 110   < > 111  --  106 108  CO2 15*  --   --   --  16* 19*  GLUCOSE 102*   < > 96  --  83 101*  BUN 100*   < > 110*  --  102* 104*  CREATININE 4.92*   < > 5.60*  --  5.03* 4.55*  CALCIUM 8.2*  --   --   --  8.3* 7.9*  PHOS  --   --   --  6.5* 6.2* 5.7*   < > = values in this interval not displayed.   Liver Function Tests: Recent Labs  Lab 01/03/20 1113 01/07/20 0212 01/08/20 0307  AST 32  --   --   ALT 52*  --   --   ALKPHOS 207*  --   --   BILITOT 0.3  --   --   PROT 7.1  --   --   ALBUMIN 3.7* 3.4* 3.1*   No results for input(s): LIPASE, AMYLASE in the last 168 hours. CBC: Recent Labs  Lab 01/06/20 1725 01/06/20 1725 01/06/20 1737 01/07/20 0212 01/08/20 0307  WBC 11.0*  --   --  12.4* 10.3  NEUTROABS 8.3*  --   --   --  7.7  HGB 10.1*   < > 10.5* 10.8* 10.2*  HCT 33.3*   < > 31.0* 34.3*  32.6*  MCV 92.2  --   --  92.5 89.8  PLT 207  --   --  241 210   < > = values in this interval not displayed.   Blood Culture    Component Value Date/Time   SDES WOUND RIGHT FOREARM 12/25/2008 1605   SPECREQUEST NONE 12/25/2008 1605   CULT NO GROWTH 2 DAYS 12/25/2008 1605   REPTSTATUS 12/28/2008 FINAL 12/25/2008 1605    Cardiac Enzymes: No results for input(s): CKTOTAL, CKMB, CKMBINDEX, TROPONINI in the last 168 hours. CBG: Recent Labs  Lab 01/07/20 0644 01/07/20 1137 01/07/20 1704 01/07/20 2147 01/08/20 0633  GLUCAP 84 153* 83 100* 101*   Iron Studies:  Recent Labs    01/08/20 4259  IRON 23*  TIBC 326  FERRITIN 81   @lablastinr3 @ Studies/Results: DG Chest Port 1 View  Result Date: 01/06/2020 CLINICAL DATA:  54 year old male with shortness of breath and bilateral lower extremity swelling. EXAM: PORTABLE CHEST 1 VIEW COMPARISON:  Chest radiograph dated 12/25/2019. FINDINGS: There is mild cardiomegaly with mild central vascular congestion. No edema. No focal consolidation, pleural effusion, or pneumothorax. No acute osseous pathology. IMPRESSION: 1. No focal consolidation or edema. 2. Mild cardiomegaly with probable mild central vascular congestion. Electronically Signed   By: Anner Crete M.D.   On: 01/06/2020 16:59   Medications:  . amLODipine  5 mg Oral Daily  . atorvastatin  40 mg Oral Daily  . furosemide  80 mg Intravenous BID  . gabapentin  100 mg Oral QHS  . heparin  5,000 Units Subcutaneous Q8H  . insulin aspart  0-15 Units Subcutaneous TID WC  . sodium bicarbonate  1,300 mg Oral BID   Assessment/Plan  **Acute on chronic HFpEF:  Recently dx in 12/2019.  Grade 2 DD, normal EF, severe LVH - thought all HTN related.  Lasix 80 IV BID for now as he's diuresing fairly well.   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  Prior proteinuria checks show subnephrotic proteinuria.   Renal function  worse than recent admission - consider cardiorenal physiology, ischemic ATN with wide swings in BP lately. Check renal US, UA, FeNa and FeUrea, UP/C --> ordered and not completed yet.    No urgent indications for dialysis but will follow closely as this may arise if he's not improving or diuresing.    **Tremor: perhaps gabapentin + low GFR related --> held yesterday and resume tonight at lower dose. Doesn't look like typical uremic asterixis.    **BMM: phos 5.7, renal diet, may need binder.  Checking PTH. Calcium ok.   **HTN:  Bps 160s currently on home meds, follow with diuresis.  Please make sure there are hold parameters to avoid hypotensive episodes that would further exacerbate AKI.   **Metabolic acidosis:  Follow, po na bicarb has been added.  **hyperkalemia: resolved with diuresis and now will supplement with K 3.9 and ongoing diuresis.  **Anemia: mild, Hb 10.8.  Iron deficient, will give 2 doses IV iron.  Hemoccult.  No indications for ESA.   Will follow.    Jannifer Hick MD 01/08/2020, 8:18 AM  [facility] Pager: [phone]

## 2020-01-09 DIAGNOSIS — K625 Hemorrhage of anus and rectum: Secondary | ICD-10-CM

## 2020-01-09 DIAGNOSIS — D509 Iron deficiency anemia, unspecified: Secondary | ICD-10-CM

## 2020-01-09 DIAGNOSIS — E8779 Other fluid overload: Secondary | ICD-10-CM

## 2020-01-09 DIAGNOSIS — N179 Acute kidney failure, unspecified: Secondary | ICD-10-CM

## 2020-01-09 DIAGNOSIS — M25571 Pain in right ankle and joints of right foot: Secondary | ICD-10-CM

## 2020-01-09 LAB — CBC WITH DIFFERENTIAL/PLATELET
Abs Immature Granulocytes: 0.11 10*3/uL — ABNORMAL HIGH (ref 0.00–0.07)
Basophils Absolute: 0 10*3/uL (ref 0.0–0.1)
Basophils Relative: 0 %
Eosinophils Absolute: 0.2 10*3/uL (ref 0.0–0.5)
Eosinophils Relative: 2 %
HCT: 33.8 % — ABNORMAL LOW (ref 39.0–52.0)
Hemoglobin: 10.4 g/dL — ABNORMAL LOW (ref 13.0–17.0)
Immature Granulocytes: 1 %
Lymphocytes Relative: 15 %
Lymphs Abs: 1.7 10*3/uL (ref 0.7–4.0)
MCH: 28 pg (ref 26.0–34.0)
MCHC: 30.8 g/dL (ref 30.0–36.0)
MCV: 90.9 fL (ref 80.0–100.0)
Monocytes Absolute: 1 10*3/uL (ref 0.1–1.0)
Monocytes Relative: 10 %
Neutro Abs: 7.9 10*3/uL — ABNORMAL HIGH (ref 1.7–7.7)
Neutrophils Relative %: 72 %
Platelets: 198 10*3/uL (ref 150–400)
RBC: 3.72 MIL/uL — ABNORMAL LOW (ref 4.22–5.81)
RDW: 15.2 % (ref 11.5–15.5)
WBC: 11 10*3/uL — ABNORMAL HIGH (ref 4.0–10.5)
nRBC: 0 % (ref 0.0–0.2)

## 2020-01-09 LAB — RENAL FUNCTION PANEL
Albumin: 3 g/dL — ABNORMAL LOW (ref 3.5–5.0)
Anion gap: 13 (ref 5–15)
BUN: 93 mg/dL — ABNORMAL HIGH (ref 6–20)
CO2: 21 mmol/L — ABNORMAL LOW (ref 22–32)
Calcium: 7.8 mg/dL — ABNORMAL LOW (ref 8.9–10.3)
Chloride: 107 mmol/L (ref 98–111)
Creatinine, Ser: 3.9 mg/dL — ABNORMAL HIGH (ref 0.61–1.24)
GFR, Estimated: 16 mL/min — ABNORMAL LOW (ref >60)
Glucose, Bld: 116 mg/dL — ABNORMAL HIGH (ref 70–99)
Phosphorus: 4.5 mg/dL (ref 2.5–4.6)
Potassium: 3.8 mmol/L (ref 3.5–5.1)
Sodium: 141 mmol/L (ref 135–145)

## 2020-01-09 LAB — PTH, INTACT AND CALCIUM
Calcium, Total (PTH): 7.8 mg/dL — ABNORMAL LOW (ref 8.7–10.2)
PTH: 309 pg/mL — ABNORMAL HIGH (ref 15–65)

## 2020-01-09 LAB — GLUCOSE, CAPILLARY
Glucose-Capillary: 107 mg/dL — ABNORMAL HIGH (ref 70–99)
Glucose-Capillary: 147 mg/dL — ABNORMAL HIGH (ref 70–99)
Glucose-Capillary: 107 mg/dL — ABNORMAL HIGH (ref 70–99)
Glucose-Capillary: 155 mg/dL — ABNORMAL HIGH (ref 70–99)

## 2020-01-09 LAB — OCCULT BLOOD X 1 CARD TO LAB, STOOL: Fecal Occult Bld: POSITIVE — AB

## 2020-01-09 MED ORDER — AMLODIPINE BESYLATE 10 MG PO TABS
10.0000 mg | ORAL_TABLET | Freq: Every day | ORAL | Status: DC
  Administered 2020-01-09 – 2020-01-10 (×2): 10 mg via ORAL
  Filled 2020-01-09 (×2): qty 1

## 2020-01-09 MED ORDER — POTASSIUM CHLORIDE CRYS ER 20 MEQ PO TBCR
20.0000 meq | EXTENDED_RELEASE_TABLET | Freq: Two times a day (BID) | ORAL | Status: AC
  Administered 2020-01-09 (×2): 20 meq via ORAL
  Filled 2020-01-09 (×2): qty 1

## 2020-01-09 NOTE — Progress Notes (Addendum)
Subjective:  Craig West reports that he is feeling "a little better" today, especially after having a shower yesterday. He has noticed his breathing improving, though she still endorses orthopnea. He reports some relief with albuterol; has inhaler at bedside. He states that his lower extremity edema is improved, but that he has some bilateral ankle pain L>R. He stumbled over his IV pole getting out of bed to shower yesterday and thinks this may be the cause of his ankle pain. He was able to catch himself and did not fall. Reports lots of urine output yesterday. Frustrated that he has to deal with IV pole and monitor wires every time he gets up to urinate.  He reports dark red blood in his stool this morning (corroborated by nursing report). His stool was also softer than his usual. This is new for him, though he admits that he does not routinely inspect his stools at home. He also states that he usually has some constipation and strains to move his bowels but this morning "it just came right out." He denies any abdominal pain.   Objective:  Vital signs in last 24 hours: Vitals:   01/08/20 2133 01/09/20 0508 01/09/20 0728 01/09/20 0931  BP: (!) 159/96 (!) 189/111  (!) 174/99  Pulse: 78 82  82  Resp: 20 (!) 21  20  Temp: 98.5 F (36.9 C) 98.4 F (36.9 C)  98.7 F (37.1 C)  TempSrc: Oral Oral  Oral  SpO2: 96% 96%  95%  Weight:   113.5 kg   Height:       Weight change:   Intake/Output Summary (Last 24 hours) at 01/09/2020 1000 Last data filed at 01/09/2020 0900 Gross per 24 hour  Intake 614.33 ml  Output 4400 ml  Net -3785.67 ml   Physical Exam Vitals and nursing note reviewed.  Constitutional:      General: He is not in acute distress. Cardiovascular:     Rate and Rhythm: Normal rate and regular rhythm.     Heart sounds: Normal heart sounds.     Comments: No pretibial pitting edema noted today, a change from previous. Mild swelling of L ankle. Tender to palpation. Without  erythema, warmth, or bruising.  Pulmonary:     Comments: Normal effort on room air while sitting upright. Decreased breath sounds at R lung base. No crackles appreciated. No wheezing noted anteriorly (a change from previous) Abdominal:     Comments: Abdomen distended, though improved from previous.  + fluid wave. Non-tender.        Assessment/Plan:  Principal Problem:   Volume overload Active Problems:   Type 2 diabetes mellitus without complication (HCC)   Essential hypertension   Asthma   Acute renal failure superimposed on stage 4 chronic kidney disease (HCC)   Acute on chronic congestive heart failure (HCC)   Hyperkalemia  ARF on CKD IV: Acute on Chronic HFpEF: Metabolic acidosis: CKD likely 2/2 diabetes and hypertensive nephrosclerosis. Came in 30lbs up from least admission and volume overloaded on exam. CXR with vascular congestion. Cr  5.6-->5.0-->4.6-->3.9 (was 3.7 on 9/27). States he is feeling better today and breathing a little more easily. Likely cardiorenal in etiology.  Renal US unremarkable. Weight is down 8 kilos from admission but still quite volume overloaded on exam. Still 8 kilos above dry weight. Tolerating aggressive diuresis without issue. Nephro recommending one more day of IV Lasix before transitioning to PO. - Continue Laxis 80mg  IV BID today; transition to PO tomorrow - Sodium Bicarb  1300mg  BID (Bicarb 21 today; may consider [place] this tomorrow) - K 3.8. Will give 40 mEq PO as we are still aggressively diuresing him - Renal diet - Outpatient follow-up with Nephro  Bloody Stool: Anemia: Uncertain etiology. Hbg in low 10s this admission, compared to 12s two weeks ago. Iron 23; Ferritin 81; TIBC  326. S/p Iron 250mg  transfusion x2. Dark red blood in loose stool this morning. - GI Consulted  - Daily CBC  HTN: Patient hypertensive to 189/111 ON. Have been holding his home hydralazine and Coreg so that he will tolerate diuresis.  - Increased  amlodipine to 10mg  daily. - Monitor vitals.   R Ankle Pain: Likely a mild strain from stumbling over IV pole yesterday. Able to bear weight without issue. No warmth, erythema or bruising.  - No need for further workup. - Patient has PRN tylenol written should he need it.      LOS: 3 days   Pearla Dubonnet, Medical Student 01/09/2020, 10:00 AM   Attestation for Student Documentation:  I personally was present and performed or re-performed the history, physical exam and medical decision-making activities of this service and have verified that the service and findings are accurately documented in the students note.  Maudie Mercury, MD 01/09/2020, 2:20 PM

## 2020-01-09 NOTE — Progress Notes (Signed)
[facility] Progress Note   Subjective:   I/Os 0.6 / 3.8, wt down trending.  Feeling some improvement.  No new issues.   Objective Vitals:   01/08/20 2133 01/09/20 0508 01/09/20 0728 01/09/20 0931  BP: (!) 159/96 (!) 189/111  (!) 174/99  Pulse: 78 82  82  Resp: 20 (!) 21  20  Temp: 98.5 F (36.9 C) 98.4 F (36.9 C)  98.7 F (37.1 C)  TempSrc: Oral Oral  Oral  SpO2: 96% 96%  95%  Weight:   113.5 kg   Height:       Physical Exam Gen: obese man who is lying comfortably flat now  Eyes: anicteric ENT: MMM CV:  RRR, no rub Abd:  Soft, obese, mildly distended, no clear fluid wave Lungs: clear to bases, now normal WOB GU: no foley Extr:  Trace LE edema Neuro: no asterixis, conversant, fully oriented Skin: warm and dry   Additional Objective Labs: Basic Metabolic Panel: Recent Labs  Lab 01/07/20 0212 01/08/20 0307 01/09/20 0110  NA 137 141 141  K 5.2* 3.9 3.8  CL 106 108 107  CO2 16* 19* 21*  GLUCOSE 83 101* 116*  BUN 102* 104* 93*  CREATININE 5.03* 4.55* 3.90*  CALCIUM 8.3* 7.9* 7.8*  PHOS 6.2* 5.7* 4.5   Liver Function Tests: Recent Labs  Lab 01/03/20 1113 01/07/20 0212 01/08/20 0307 01/09/20 0110  AST 32  --   --   --   ALT 52*  --   --   --   ALKPHOS 207*  --   --   --   BILITOT 0.3  --   --   --   PROT 7.1  --   --   --   ALBUMIN 3.7* 3.4* 3.1* 3.0*   No results for input(s): LIPASE, AMYLASE in the last 168 hours. CBC: Recent Labs  Lab 01/06/20 1725 01/06/20 1737 01/07/20 0212 01/08/20 0307 01/09/20 0110  WBC 11.0*   < > 12.4* 10.3 11.0*  NEUTROABS 8.3*  --   --  7.7 7.9*  HGB 10.1*   < > 10.8* 10.2* 10.4*  HCT 33.3*   < > 34.3* 32.6* 33.8*  MCV 92.2  --  92.5 89.8 90.9  PLT 207   < > 241 210 198   < > = values in this interval not displayed.   Blood Culture    Component Value Date/Time   SDES WOUND RIGHT FOREARM 12/25/2008 1605   SPECREQUEST NONE 12/25/2008 1605   CULT NO GROWTH 2 DAYS 12/25/2008 1605   REPTSTATUS  12/28/2008 FINAL 12/25/2008 1605    Cardiac Enzymes: No results for input(s): CKTOTAL, CKMB, CKMBINDEX, TROPONINI in the last 168 hours. CBG: Recent Labs  Lab 01/08/20 0633 01/08/20 1154 01/08/20 1631 01/08/20 2133 01/09/20 0657  GLUCAP 101* 149* 107* 133* 107*   Iron Studies:  Recent Labs    01/08/20 0307  IRON 23*  TIBC 326  FERRITIN 81   @lablastinr3 @ Studies/Results: US RENAL  Result Date: 01/08/2020 CLINICAL DATA:  Acute renal failure EXAM: RENAL / URINARY TRACT ULTRASOUND COMPLETE COMPARISON:  None. FINDINGS: Right Kidney: Renal measurements: 11.2 x 5.0 x 4.7 cm = volume: 139.7 mL. Normal renal cortical thickness and echogenicity. No hydronephrosis. There is a 1.6 cm cyst midpole right kidney. Left Kidney: Renal measurements: 11.4 x 5.1 x 5.6 cm = volume: 170.6 mL. Normal renal cortical thickness and echogenicity. No hydronephrosis. There is a partially exophytic 2.3 cm cyst. Bladder: Appears normal for degree of  bladder distention. Other: Small volume right upper quadrant ascites. IMPRESSION: No hydronephrosis. Electronically Signed   By: Lovey Newcomer M.D.   On: 01/08/2020 15:36   Medications:  . amLODipine  10 mg Oral Daily  . atorvastatin  40 mg Oral Daily  . furosemide  80 mg Intravenous BID  . gabapentin  100 mg Oral QHS  . heparin  5,000 Units Subcutaneous Q8H  . insulin aspart  0-15 Units Subcutaneous TID WC  . potassium chloride  20 mEq Oral BID  . sodium bicarbonate  1,300 mg Oral BID   Assessment/Plan  **Acute on chronic HFpEF:  Recently dx in 12/2019.  Grade 2 DD, normal EF, severe LVH - thought all HTN related.  Lasix 80 IV BID for now as he's diuresing fairly well.   Would continue this today then consider change to po regimen - failed 40 daily - perhaps 60 BID would be a good starting place and can titrate for effect.  Discussed need for low na diet, fluid restriction, daily weights today.     **AKI on CKD 4:  Suspect this is underlying hypertensive  nephrosclerosis in light of long standing severe HTN.  Prior proteinuria checks show subnephrotic proteinuria.   Renal function worse than recent admission - most likely cardiorenal physiology, ischemic ATN with wide swings in BP lately. Renal US normal. UA, FeNa and FeUrea, UP/C --> ordered and not completed yet.    No urgent indications for dialysis and renal function improving with diuresis.  Will need outpt nephrology f/u at discharge --> discussed with him today, will have my office schedule him and he has my card.  **Tremor: perhaps gabapentin + low GFR related --> improved on lower dose gabapentin. Doesn't look like typical uremic asterixis.    **BMM: phos 5.7, renal diet, may need binder.  Checking PTH. Calcium ok.   **HTN:  Bps 160s currently on home meds, follow with diuresis.  Please make sure there are hold parameters to avoid hypotensive episodes that would further exacerbate AKI.   **Metabolic acidosis:  Follow, po na bicarb has been added.  **hyperkalemia: resolved with diuresis and now will supplement with K 3.9 and ongoing diuresis.  **Anemia: mild, Hb 10.8.  Iron deficient, will give 2 doses IV iron.  Hemoccult pending.  No indications for ESA.   Will sign off.  Will schedule him in nephrology clinic with me in about 4 weeks.   Jannifer Hick MD 01/09/2020, 10:00 AM  [facility] Pager: [phone]

## 2020-01-09 NOTE — Consult Note (Addendum)
Consultation  Referring Provider: Dr. Heber Macdoel      Primary Care Physician:  Charlott Rakes, MD Primary Gastroenterologist: Althia Forts       Reason for Consultation: Rectal bleeding             HPI:   Craig West is a 54 y.o. male with a past medical history of heart failure, hypertension, diabetes and asthma, who initially presented to the ER on 01/06/2020 with shortness of breath and gradual worsening over the past several days.  He was diagnosed with volume overload and AKI on CKD stage IV treated with Lasix etc.  We are called to base into regards to any finding of rectal bleeding this morning.    Today, the patient reports that he has been constipated passing small hard stools which are hard to initiate over the past 2 weeks.  This morning he passed a stool easily but did see some bright red blood with it.  This is the first time he has seen any bleeding.  Denies any previous history of the same.  Tells me he has never had a colonoscopy.      Denies abdominal pain, rectal pain, fever, chills, weight loss or change in bowel habits.  GI history: None  Past Medical History:  Diagnosis Date  . Asthma   . CHF (congestive heart failure) ([place])   . Diabetes mellitus without complication ([place])   . Gout   . Hypertension     Past Surgical History:  Procedure Laterality Date  . NO PAST SURGERIES      Family History  Problem Relation Age of Onset  . Heart failure Brother      Social History   Tobacco Use  . Smoking status: Former Smoker    Packs/day: 0.50    Types: Cigarettes    Quit date: 10/27/2019    Years since quitting: 0.2  . Smokeless tobacco: Never Used  Vaping Use  . Vaping Use: Never used  Substance Use Topics  . Alcohol use: Yes    Alcohol/week: 0.0 standard drinks    Comment: occ  . Drug use: No    Prior to Admission medications   Medication Sig Start Date End Date Taking? Authorizing Provider  albuterol (VENTOLIN HFA) 108 (90 Base) MCG/ACT inhaler  Inhale 2 puffs into the lungs every 6 (six) hours as needed for wheezing or shortness of breath. 12/27/19  Yes Andrew Au, MD  allopurinol (ZYLOPRIM) 100 MG tablet Take 200 mg by mouth daily.   Yes [provider]  amLODipine (NORVASC) 5 MG tablet Take 1 tablet (5 mg total) by mouth daily. For hypertension 01/03/20  Yes Newlin, Charlane Ferretti, MD  atorvastatin (LIPITOR) 40 MG tablet Take 1 tablet (40 mg total) by mouth daily. For hypercholesterolemia 12/27/19  Yes Andrew Au, MD  carvedilol (COREG) 12.5 MG tablet Take 1 tablet (12.5 mg total) by mouth 2 (two) times daily with a meal. 12/27/19  Yes Andrew Au, MD  colchicine 0.6 MG tablet Take 2 tablets (1.2 mg) orally at the onset of a gout attack; may repeat 1 tablet (0.6 mg) in 2 hours if symptoms persist Patient taking differently: Take 0.6 mg by mouth See admin instructions. Take 2 tablets (1.2 mg) orally at the onset of a gout attack; may repeat 1 tablet (0.6 mg) in 2 hours if symptoms persist As needed for Gout flare up. 12/27/19  Yes Andrew Au, MD  furosemide (LASIX) 40 MG tablet Take 1 tablet (40  mg total) by mouth daily. 12/27/19  Yes Andrew Au, MD  gabapentin (NEURONTIN) 300 MG capsule Take 1 capsule (300 mg total) by mouth 2 (two) times daily. For diabetic neuropathy 12/27/19  Yes Andrew Au, MD  glipiZIDE (GLUCOTROL) 10 MG tablet Take 1 tablet (10 mg total) by mouth 2 (two) times daily before a meal. 12/27/19  Yes Andrew Au, MD  hydrALAZINE (APRESOLINE) 50 MG tablet Take 1 tablet (50 mg total) by mouth every 12 (twelve) hours. 12/27/19  Yes Andrew Au, MD  isosorbide mononitrate (IMDUR) 60 MG 24 hr tablet Take 1 tablet (60 mg total) by mouth daily. 12/27/19  Yes Andrew Au, MD  tamsulosin (FLOMAX) 0.4 MG CAPS capsule Take 1 capsule (0.4 mg total) by mouth daily. 12/27/19  Yes Andrew Au, MD  Blood Glucose Monitoring Suppl (TRUE METRIX METER) w/Device KIT Check blood sugar fasting and ant bedtime and record  05/15/17   Charlott Rakes, MD  glucose blood (TRUE METRIX BLOOD GLUCOSE TEST) test strip Use as instructed 12/27/19   Andrew Au, MD  HYDROcodone-acetaminophen (NORCO/VICODIN) 5-325 MG tablet Take 1 tablet by mouth every 4 (four) hours as needed. Patient not taking: Reported on 01/07/2020 05/25/17   Lorin Glass, PA-C  Misc. Devices (DIGITAL GLASS SCALE) MISC Use scale to weight yourself. Increasing weight may indicate fluid overload 12/27/19   Andrew Au, MD  TRUEplus Lancets 28G MISC Check blood sugar fasting and at bedtime 12/27/19   Andrew Au, MD    Current Facility-Administered Medications  Medication Dose Route Frequency Provider Last Rate Last Admin  . acetaminophen (TYLENOL) tablet 650 mg  650 mg Oral Q6H PRN Asencion Noble, MD       Or  . acetaminophen (TYLENOL) suppository 650 mg  650 mg Rectal Q6H PRN Asencion Noble, MD      . albuterol (VENTOLIN HFA) 108 (90 Base) MCG/ACT inhaler 2 puff  2 puff Inhalation Q6H PRN Asencion Noble, MD   2 puff at 01/07/20 0124  . amLODipine (NORVASC) tablet 10 mg  10 mg Oral Daily Jeralyn Bennett, MD   10 mg at 01/09/20 0813  . atorvastatin (LIPITOR) tablet 40 mg  40 mg Oral Daily Lonia Skinner M, MD   40 mg at 01/09/20 0813  . furosemide (LASIX) injection 80 mg  80 mg Intravenous BID Alexandria Lodge, MD   80 mg at 01/09/20 0813  . gabapentin (NEURONTIN) capsule 100 mg  100 mg Oral QHS Justin Mend, MD   100 mg at 01/08/20 2159  . heparin injection 5,000 Units  5,000 Units Subcutaneous Q8H Asencion Noble, MD   5,000 Units at 01/09/20 0606  . insulin aspart (novoLOG) injection 0-15 Units  0-15 Units Subcutaneous TID WC Asencion Noble, MD   2 Units at 01/08/20 1219  . potassium chloride SA (KLOR-CON) CR tablet 20 mEq  20 mEq Oral BID Alexandria Lodge, MD      . sodium bicarbonate tablet 1,300 mg  1,300 mg Oral BID Katsadouros, Vasilios, MD   1,300 mg at 01/09/20 0813    Allergies as of 01/06/2020  . (No Known  Allergies)     Review of Systems:    Constitutional: No weight loss, fever or chills Skin: No rash  Cardiovascular: No chest pain  Respiratory: +SOB Gastrointestinal: See HPI and otherwise negative Genitourinary: No dysuria Neurological: No headache, dizziness or syncope Musculoskeletal: No new muscle or joint pain Hematologic: No bruising Psychiatric: No  history of depression or anxiety    Physical Exam:  Vital signs in last 24 hours: Temp:  [98.1 F (36.7 C)-98.7 F (37.1 C)] 98.7 F (37.1 C) (10/10 0931) Pulse Rate:  [78-82] 82 (10/10 0931) Resp:  [20-22] 20 (10/10 0931) BP: (159-189)/(96-111) 174/99 (10/10 0931) SpO2:  [95 %-96 %] 95 % (10/10 0931) Weight:  [113.5 kg] 113.5 kg (10/10 0728) Last BM Date: 01/07/20 General:   Pleasant AA male appears to be in NAD, Well developed, Well nourished, alert and cooperative Head:  Normocephalic and atraumatic. Eyes:   PEERL, EOMI. No icterus. Conjunctiva pink. Ears:  Normal auditory acuity. Neck:  Supple Throat: Oral cavity and pharynx without inflammation, swelling or lesion.  Lungs: Respirations even and unlabored. Lungs clear to auscultation bilaterally.   No wheezes, crackles, or rhonchi.  Heart: Normal S1, S2. No MRG. Regular rate and rhythm. No peripheral edema, cyanosis or pallor.  Abdomen:  Soft, nondistended, nontender. No rebound or guarding. Normal bowel sounds. No appreciable masses or hepatomegaly. Rectal:  External: no fissure or hemorrhoids, no ttp; Internal: no mass or fullness, no residue, no ttp Msk:  Symmetrical without gross deformities. Peripheral pulses intact.  Extremities:  1+ b/l LE edema Neurologic:  Alert and  oriented x4;  grossly normal neurologically.  Skin:   Dry and intact without significant lesions or rashes. Psychiatric: Demonstrates good judgement and reason without abnormal affect or behaviors.   LAB RESULTS: Recent Labs    01/07/20 0212 01/08/20 0307 01/09/20 0110  WBC 12.4* 10.3  11.0*  HGB 10.8* 10.2* 10.4*  HCT 34.3* 32.6* 33.8*  PLT 241 210 198   BMET Recent Labs    01/07/20 0212 01/08/20 0307 01/09/20 0110  NA 137 141 141  K 5.2* 3.9 3.8  CL 106 108 107  CO2 16* 19* 21*  GLUCOSE 83 101* 116*  BUN 102* 104* 93*  CREATININE 5.03* 4.55* 3.90*  CALCIUM 8.3* 7.9* 7.8*   LFT Recent Labs    01/09/20 0110  ALBUMIN 3.0*   PT/INR No results for input(s): LABPROT, INR in the last 72 hours.  STUDIES: US RENAL  Result Date: 01/08/2020 CLINICAL DATA:  Acute renal failure EXAM: RENAL / URINARY TRACT ULTRASOUND COMPLETE COMPARISON:  None. FINDINGS: Right Kidney: Renal measurements: 11.2 x 5.0 x 4.7 cm = volume: 139.7 mL. Normal renal cortical thickness and echogenicity. No hydronephrosis. There is a 1.6 cm cyst midpole right kidney. Left Kidney: Renal measurements: 11.4 x 5.1 x 5.6 cm = volume: 170.6 mL. Normal renal cortical thickness and echogenicity. No hydronephrosis. There is a partially exophytic 2.3 cm cyst. Bladder: Appears normal for degree of bladder distention. Other: Small volume right upper quadrant ascites. IMPRESSION: No hydronephrosis. Electronically Signed   By: Lovey Newcomer M.D.   On: 01/08/2020 15:36    Impression / Plan:   Impression: 1.  Rectal bleeding: Started this morning, one episode, hemoglobin seems to be stable at 10.4 currently, has ranged in the tens since admission, no abdominal pain, no previous colonoscopy, rectal exam unrevealing; consider hemorrhoids versus other 2.  Acute on chronic HFpEF: Recently diagnosed 6/56, grade 2 diastolic dysfunction, normal EF, Lasix 80 IV twice daily, doing better now 3.  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  Anemia: Mild; hemoglobin 12.2 on 12/26/2019--> 10.1 on 01/06/2020--> 10.8 on 01/07/2020--> 10.4 this morning, iron deficient-given 2 doses of IV iron  Plan: 1.  Patient tells me he is staying in the  hospital due to  his continued shortness of breath and being "volume overloaded".  He has only had 1 bowel movement with some small amount of bright red blood.  His hemoglobin is stable.  There is no need for urgent procedures.  In fact, if he does not have an increased amount of bleeding over the next 24 hours/during his hospital stay then colonoscopy could be done possibly outpatient in the next month or so to allow him time to recover from current symptoms. 2.  Continue to monitor hemoglobin with transfusion as needed less than 7 3.  Would hold heparin if able 4.  Please let us know if patient has an increase in bleeding or symptoms change 5.  Please await further recommendations from Dr. Havery Moros later today  Thank you for your kind consultation, we will continue to follow.  Lavone Nian Yohannes Waibel  01/09/2020, 11:18 AM

## 2020-01-09 NOTE — Progress Notes (Signed)
   01/09/20 0508  Vital Signs  BP (!) 189/111   textpage oncall Internal Medicine  to make aware. Received a call back. See new order

## 2020-01-09 NOTE — TOC Initial Note (Addendum)
Transition of Care [facility]) - Initial/Assessment Note    Patient Details  Name: Craig West MRN: [id number] Date of Birth: Jun 28, 1965  Transition of Care [facility]) CM/SW Contact:    Norina Buzzard, RN Phone Number: 01/09/2020, 3:08 PM  Clinical Narrative: 54 yo with HTN, DM, asthma, recent dx HFpEF + severe LVH. Admitted for volume overload and nephrology is consulted for evaluation and management of CKD/AKI. Per nephrology consult note, no urgent indications for dialysis but will follow closely as this may arise if he's not improving or diuresing.   Pt doesn't have any insurance. Met with pt at bedside. He reports that he goes to the [facility] and Dr. Margarita Rana is his PCP. He reports that he used to have Medicaid in [place]. He tried to apply for Medicaid in [place] but he was told that he can never again re-apply for Medicaid. He contacted the department of social services in [place] and he was told that he can't re-apply for Medicaid because he was in prison due to drugs.   He reports that he pays $4 per prescription at the [facility]. He denies any issues buying his medications. He reports that he works and he has a car.   Informed pt that we are not able to assist him with his D/C meds because he was recently D/C from the hospital on 12/27/19 and we helped him with his meds through the [facility] program. He verbalized understanding.  Discussed the importance of monitoring his weight every morning. He was supposed to get a scale but he said he didn't have time to get one. Encouraged pt to get one at [facility] and he stated that this time he is going to buy one and he reports that he can afford it. Encouraged pt to keep a log of his daily weight and to notify his physician if he gains 2 pounds overnight.  Will continue to f/u to assist with the D/C plan if prn.    Expected Discharge Plan: Home/Self Care Barriers to Discharge: Inadequate or no  insurance   Patient Goals and CMS Choice Patient states their goals for this hospitalization and ongoing recovery are:: to get better      Expected Discharge Plan and Services Expected Discharge Plan: Home/Self Care   Discharge Planning Services: CM Consult   Living arrangements for the past 2 months: Single Family Home                                      Prior Living Arrangements/Services Living arrangements for the past 2 months: Single Family Home Lives with:: Friends Patient language and need for interpreter reviewed:: Yes Do you feel safe going back to the place where you live?: Yes               Activities of Daily Living Home Assistive Devices/Equipment: None ADL Screening (condition at time of admission) Patient's cognitive ability adequate to safely complete daily activities?: Yes Is the patient deaf or have difficulty hearing?: No Does the patient have difficulty seeing, even when wearing glasses/contacts?: No Does the patient have difficulty concentrating, remembering, or making decisions?: No Patient able to express need for assistance with ADLs?: Yes Does the patient have difficulty dressing or bathing?: No Independently performs ADLs?: Yes (appropriate for developmental age) Does the patient have difficulty walking or climbing stairs?: Yes (dyspnea) Weakness of Legs: None Weakness of Arms/Hands: None  Permission Sought/Granted Permission sought to share information with : Case Manager                Emotional Assessment Appearance:: Developmentally appropriate, Well-Groomed Attitude/Demeanor/Rapport: Engaged, Self-Confident Affect (typically observed): Accepting, Pleasant, Calm Orientation: : Oriented to Self, Oriented to Place, Oriented to  Time, Oriented to Situation      Admission diagnosis:  Hyperkalemia [E87.5] SOB (shortness of breath) [R06.02] Volume overload [E87.70] Acute renal failure, unspecified acute renal failure type  ([place]) [N17.9] Acute on chronic congestive heart failure, unspecified heart failure type [facility]) [I50.9] Patient Active Problem List   Diagnosis Date Noted  . Acute on chronic congestive heart failure ([place])   . Hyperkalemia   . Volume overload 01/06/2020  . Acute diastolic heart failure ([place])   . Acute renal failure superimposed on stage 4 chronic kidney disease ([place])   . Demand ischemia ([place])   . Hypertensive urgency 12/23/2019  . Obesity (BMI 30.0-34.9) 12/09/2017  . Non compliance w medication regimen 04/18/2017  . Gout 08/02/2016  . Diabetic neuropathy ([place]) 08/02/2016  . CKD (chronic kidney disease), stage V ([place]) 08/02/2016  . Hyperlipidemia 10/24/2015  . Asthma 10/16/2015  . Type 2 diabetes mellitus without complication ([place]) 16/60/6004  . Essential hypertension 06/13/2014  . Smoker 06/13/2014   PCP:  Charlott Rakes, MD Pharmacy:   [facility] [place] [address] Phone: [phone] Fax: [phone]     Social Determinants of Health (SDOH) Interventions    Readmission Risk Interventions No flowsheet data found.

## 2020-01-10 DIAGNOSIS — E8770 Fluid overload, unspecified: Secondary | ICD-10-CM

## 2020-01-10 DIAGNOSIS — D124 Benign neoplasm of descending colon: Secondary | ICD-10-CM

## 2020-01-10 DIAGNOSIS — D122 Benign neoplasm of ascending colon: Secondary | ICD-10-CM

## 2020-01-10 DIAGNOSIS — D125 Benign neoplasm of sigmoid colon: Secondary | ICD-10-CM

## 2020-01-10 LAB — CBC
HCT: 33.9 % — ABNORMAL LOW (ref 39.0–52.0)
Hemoglobin: 10.6 g/dL — ABNORMAL LOW (ref 13.0–17.0)
MCH: 27.9 pg (ref 26.0–34.0)
MCHC: 31.3 g/dL (ref 30.0–36.0)
MCV: 89.2 fL (ref 80.0–100.0)
Platelets: 222 10*3/uL (ref 150–400)
RBC: 3.8 MIL/uL — ABNORMAL LOW (ref 4.22–5.81)
RDW: 14.9 % (ref 11.5–15.5)
WBC: 12.3 10*3/uL — ABNORMAL HIGH (ref 4.0–10.5)
nRBC: 0 % (ref 0.0–0.2)

## 2020-01-10 LAB — GLUCOSE, CAPILLARY
Glucose-Capillary: 98 mg/dL (ref 70–99)
Glucose-Capillary: 130 mg/dL — ABNORMAL HIGH (ref 70–99)
Glucose-Capillary: 121 mg/dL — ABNORMAL HIGH (ref 70–99)
Glucose-Capillary: 184 mg/dL — ABNORMAL HIGH (ref 70–99)

## 2020-01-10 LAB — BASIC METABOLIC PANEL
Anion gap: 13 (ref 5–15)
BUN: 81 mg/dL — ABNORMAL HIGH (ref 6–20)
CO2: 23 mmol/L (ref 22–32)
Calcium: 8 mg/dL — ABNORMAL LOW (ref 8.9–10.3)
Chloride: 105 mmol/L (ref 98–111)
Creatinine, Ser: 3.67 mg/dL — ABNORMAL HIGH (ref 0.61–1.24)
GFR, Estimated: 17 mL/min — ABNORMAL LOW (ref >60)
Glucose, Bld: 98 mg/dL (ref 70–99)
Potassium: 3.8 mmol/L (ref 3.5–5.1)
Sodium: 141 mmol/L (ref 135–145)

## 2020-01-10 MED ORDER — POTASSIUM CHLORIDE CRYS ER 20 MEQ PO TBCR
20.0000 meq | EXTENDED_RELEASE_TABLET | Freq: Two times a day (BID) | ORAL | Status: AC
  Administered 2020-01-10 (×2): 20 meq via ORAL
  Filled 2020-01-10 (×2): qty 1

## 2020-01-10 MED ORDER — METOCLOPRAMIDE HCL 5 MG/ML IJ SOLN
10.0000 mg | Freq: Once | INTRAMUSCULAR | Status: AC
  Administered 2020-01-10: 10 mg via INTRAVENOUS
  Filled 2020-01-10: qty 2

## 2020-01-10 MED ORDER — PEG-KCL-NACL-NASULF-NA ASC-C 100 G PO SOLR
0.5000 | Freq: Once | ORAL | Status: AC
  Administered 2020-01-10: 100 g via ORAL
  Filled 2020-01-10 (×2): qty 1

## 2020-01-10 MED ORDER — BISACODYL 5 MG PO TBEC
20.0000 mg | DELAYED_RELEASE_TABLET | Freq: Once | ORAL | Status: AC
  Administered 2020-01-10: 20 mg via ORAL
  Filled 2020-01-10: qty 4

## 2020-01-10 MED ORDER — METOCLOPRAMIDE HCL 5 MG/ML IJ SOLN
10.0000 mg | Freq: Once | INTRAMUSCULAR | Status: AC
  Administered 2020-01-11: 10 mg via INTRAVENOUS
  Filled 2020-01-10: qty 2

## 2020-01-10 MED ORDER — CARVEDILOL 12.5 MG PO TABS
12.5000 mg | ORAL_TABLET | Freq: Two times a day (BID) | ORAL | Status: DC
  Administered 2020-01-10 – 2020-01-11 (×4): 12.5 mg via ORAL
  Filled 2020-01-10 (×4): qty 1

## 2020-01-10 MED ORDER — PEG-KCL-NACL-NASULF-NA ASC-C 100 G PO SOLR
0.5000 | Freq: Once | ORAL | Status: AC
  Administered 2020-01-11: 100 g via ORAL

## 2020-01-10 MED ORDER — FUROSEMIDE 40 MG PO TABS
60.0000 mg | ORAL_TABLET | Freq: Two times a day (BID) | ORAL | Status: DC
  Administered 2020-01-10 – 2020-01-11 (×3): 60 mg via ORAL
  Filled 2020-01-10 (×3): qty 1

## 2020-01-10 MED ORDER — PEG-KCL-NACL-NASULF-NA ASC-C 100 G PO SOLR: 1.0000 | Freq: Once | ORAL | Status: DC

## 2020-01-10 NOTE — H&P (View-Only) (Signed)
Daily Rounding Note  01/10/2020, 1:30 PM  LOS: 4 days   SUBJECTIVE:   Chief complaint:   Limited BPR  3 vrown stools today, no blood.  Feels ok.    OBJECTIVE:         Vital signs in last 24 hours:    Temp:  [98.6 F (37 C)-99.3 F (37.4 C)] 99 F (37.2 C) (10/11 0835) Pulse Rate:  [79-90] 90 (10/11 0835) Resp:  [18-21] 21 (10/11 0835) BP: (157-182)/(99-111) 182/111 (10/11 0835) SpO2:  [96 %-98 %] 96 % (10/11 0835) Weight:  [108.9 kg] 108.9 kg (10/11 0835) Last BM Date: 01/09/20 Filed Weights   01/07/20 2146 01/09/20 0728 01/10/20 0835  Weight: 121.2 kg 113.5 kg 108.9 kg   General: obese, not acutely ill looking   Heart: NSR w PVCs in 90s Chest: clear bil.  No dyspnea or cough Abdomen: obese, soft, active BS, NT  Extremities: trace LE edema Neuro/Psych:  Oriented x 3.  No tremor.    Intake/Output from previous day: 10/10 0701 - 10/11 0700 In: 240 [P.O.:240] Out: 4000 [Urine:4000]  Intake/Output this shift: Total I/O In: 240 [P.O.:240] Out: 1100 [Urine:1100]  Lab Results: Recent Labs    01/08/20 0307 01/09/20 0110 01/10/20 0121  WBC 10.3 11.0* 12.3*  HGB 10.2* 10.4* 10.6*  HCT 32.6* 33.8* 33.9*  PLT 210 198 222   BMET Recent Labs    01/08/20 0307 01/09/20 0110 01/10/20 0121  NA 141 141 141  K 3.9 3.8 3.8  CL 108 107 105  CO2 19* 21* 23  GLUCOSE 101* 116* 98  BUN 104* 93* 81*  CREATININE 4.55* 3.90* 3.67*  CALCIUM 7.9*  7.8* 7.8* 8.0*   LFT Recent Labs    01/08/20 0307 01/09/20 0110  ALBUMIN 3.1* 3.0*   PT/INR No results for input(s): LABPROT, INR in the last 72 hours. Hepatitis Panel No results for input(s): HEPBSAG, HCVAB, HEPAIGM, HEPBIGM in the last 72 hours.  Studies/Results: US RENAL  Result Date: 01/08/2020 CLINICAL DATA:  Acute renal failure EXAM: RENAL / URINARY TRACT ULTRASOUND COMPLETE COMPARISON:  None. FINDINGS: Right Kidney: Renal measurements: 11.2 x 5.0 x 4.7  cm = volume: 139.7 mL. Normal renal cortical thickness and echogenicity. No hydronephrosis. There is a 1.6 cm cyst midpole right kidney. Left Kidney: Renal measurements: 11.4 x 5.1 x 5.6 cm = volume: 170.6 mL. Normal renal cortical thickness and echogenicity. No hydronephrosis. There is a partially exophytic 2.3 cm cyst. Bladder: Appears normal for degree of bladder distention. Other: Small volume right upper quadrant ascites. IMPRESSION: No hydronephrosis. Electronically Signed   By: Lovey Newcomer M.D.   On: 01/08/2020 15:36   Scheduled Meds: . amLODipine  10 mg Oral Daily  . atorvastatin  40 mg Oral Daily  . carvedilol  12.5 mg Oral BID WC  . furosemide  60 mg Oral BID  . gabapentin  100 mg Oral QHS  . insulin aspart  0-15 Units Subcutaneous TID WC  . potassium chloride  20 mEq Oral BID   Continuous Infusions: PRN Meds:.acetaminophen **OR** acetaminophen, albuterol  ASSESMENT:   *   Limited bleeding per rectum after passing hard stool. No recurrence No previous colonoscopy  *   Mild, iron deficient anemia. Hgb 10.1 >> 10.6.  Was ~ 12.1 ten days ago.    *     Volume overload in patient with CHF, AKI superimposed on CKD 4 BUN/creat improved.    Grade 2 DD, normal EF,  severe LVH all felt htn related.       PLAN   *   Outpt GI fup.  Direct colonoscopy vs MD/APP visit and then set up colonoscopy??     [name]  01/10/2020, 1:30 PM Phone [phone]

## 2020-01-10 NOTE — Progress Notes (Signed)
Daily Rounding Note  01/10/2020, 1:30 PM  LOS: 4 days   SUBJECTIVE:   Chief complaint:   Limited BPR  3 vrown stools today, no blood.  Feels ok.    OBJECTIVE:         Vital signs in last 24 hours:    Temp:  [98.6 F (37 C)-99.3 F (37.4 C)] 99 F (37.2 C) (10/11 0835) Pulse Rate:  [79-90] 90 (10/11 0835) Resp:  [18-21] 21 (10/11 0835) BP: (157-182)/(99-111) 182/111 (10/11 0835) SpO2:  [96 %-98 %] 96 % (10/11 0835) Weight:  [108.9 kg] 108.9 kg (10/11 0835) Last BM Date: 01/09/20 Filed Weights   01/07/20 2146 01/09/20 0728 01/10/20 0835  Weight: 121.2 kg 113.5 kg 108.9 kg   General: obese, not acutely ill looking   Heart: NSR w PVCs in 90s Chest: clear bil.  No dyspnea or cough Abdomen: obese, soft, active BS, NT  Extremities: trace LE edema Neuro/Psych:  Oriented x 3.  No tremor.    Intake/Output from previous day: 10/10 0701 - 10/11 0700 In: 240 [P.O.:240] Out: 4000 [Urine:4000]  Intake/Output this shift: Total I/O In: 240 [P.O.:240] Out: 1100 [Urine:1100]  Lab Results: Recent Labs    01/08/20 0307 01/09/20 0110 01/10/20 0121  WBC 10.3 11.0* 12.3*  HGB 10.2* 10.4* 10.6*  HCT 32.6* 33.8* 33.9*  PLT 210 198 222   BMET Recent Labs    01/08/20 0307 01/09/20 0110 01/10/20 0121  NA 141 141 141  K 3.9 3.8 3.8  CL 108 107 105  CO2 19* 21* 23  GLUCOSE 101* 116* 98  BUN 104* 93* 81*  CREATININE 4.55* 3.90* 3.67*  CALCIUM 7.9*  7.8* 7.8* 8.0*   LFT Recent Labs    01/08/20 0307 01/09/20 0110  ALBUMIN 3.1* 3.0*   PT/INR No results for input(s): LABPROT, INR in the last 72 hours. Hepatitis Panel No results for input(s): HEPBSAG, HCVAB, HEPAIGM, HEPBIGM in the last 72 hours.  Studies/Results: US RENAL  Result Date: 01/08/2020 CLINICAL DATA:  Acute renal failure EXAM: RENAL / URINARY TRACT ULTRASOUND COMPLETE COMPARISON:  None. FINDINGS: Right Kidney: Renal measurements: 11.2 x 5.0 x 4.7  cm = volume: 139.7 mL. Normal renal cortical thickness and echogenicity. No hydronephrosis. There is a 1.6 cm cyst midpole right kidney. Left Kidney: Renal measurements: 11.4 x 5.1 x 5.6 cm = volume: 170.6 mL. Normal renal cortical thickness and echogenicity. No hydronephrosis. There is a partially exophytic 2.3 cm cyst. Bladder: Appears normal for degree of bladder distention. Other: Small volume right upper quadrant ascites. IMPRESSION: No hydronephrosis. Electronically Signed   By: Lovey Newcomer M.D.   On: 01/08/2020 15:36   Scheduled Meds: . amLODipine  10 mg Oral Daily  . atorvastatin  40 mg Oral Daily  . carvedilol  12.5 mg Oral BID WC  . furosemide  60 mg Oral BID  . gabapentin  100 mg Oral QHS  . insulin aspart  0-15 Units Subcutaneous TID WC  . potassium chloride  20 mEq Oral BID   Continuous Infusions: PRN Meds:.acetaminophen **OR** acetaminophen, albuterol  ASSESMENT:   *   Limited bleeding per rectum after passing hard stool. No recurrence No previous colonoscopy  *   Mild, iron deficient anemia. Hgb 10.1 >> 10.6.  Was ~ 12.1 ten days ago.    *     Volume overload in patient with CHF, AKI superimposed on CKD 4 BUN/creat improved.    Grade 2 DD, normal EF,  severe LVH all felt htn related.       PLAN   *   Outpt GI fup.  Direct colonoscopy vs MD/APP visit and then set up colonoscopy??     [name]  01/10/2020, 1:30 PM Phone [phone]

## 2020-01-10 NOTE — Plan of Care (Signed)
  Problem: Coping: Goal: Level of anxiety will decrease Outcome: Progressing   Problem: Fluid Volume: Goal: Fluid volume balance will be maintained or improved Outcome: Progressing   Problem: Urinary Elimination: Goal: Progression of disease will be identified and treated Outcome: Progressing

## 2020-01-10 NOTE — Progress Notes (Signed)
HD#4 Subjective:   No acute events overnight.   Evaluated at bedside during rounds. Patient states he is able to breathe better, pleased with weight loss overnight. When asked how close to baseline he feels, he states "almost there." Denies seeing any additional blood in his stool. He notes right arm pain associated with swelling which started yesterday evening at his PIV site, asked for removal. Discussed plan to switch to PO Lasix today.   Objective:   Vital signs in last 24 hours: Vitals:   01/09/20 1725 01/09/20 2121 01/10/20 0456 01/10/20 0835  BP: (!) 173/99 (!) 157/101 (!) 180/101 (!) 182/111  Pulse: 79 83 86 90  Resp: 18 19 19  (!) 21  Temp: 99.1 F (37.3 C) 98.6 F (37 C) 99.3 F (37.4 C) 99 F (37.2 C)  TempSrc: Oral Oral Oral Oral  SpO2: 98% 97% 97% 96%  Weight:    108.9 kg  Height:       Physical Exam Constitutional: well-appearing gentleman sitting on the edge of bed, in no acute distress HENT: normocephalic atraumatic, mucous membranes moist Cardiovascular: regular rate and rhythm, no m/r/g; No pretibial pitting edema today. Pulmonary/Chest: normal work of breathing on room air, no crackles appreciated, no wheezing Abdominal: Decreased distension of abdomen, abdomen is soft, non-tender  Filed Weights   01/07/20 2146 01/09/20 0728 01/10/20 0835  Weight: 121.2 kg 113.5 kg 108.9 kg   Weight on admission: 117.9 kg (260 lb) Weight this morning: 108.9 kg (240 lb) Net weight change this admission: -9 kg (20 lb)   Intake/Output Summary (Last 24 hours) at 01/10/2020 1352 Last data filed at 01/10/2020 1300 Gross per 24 hour  Intake 480 ml  Output 4500 ml  Net -4020 ml   Net IO Since Admission: -10,274.67 mL [01/10/20 1352]  Pertinent Labs: CBC Latest Ref Rng & Units 01/10/2020 01/09/2020 01/08/2020  WBC 4.0 - 10.5 K/uL 12.3(H) 11.0(H) 10.3  Hemoglobin 13.0 - 17.0 g/dL 10.6(L) 10.4(L) 10.2(L)  Hematocrit 39 - 52 % 33.9(L) 33.8(L) 32.6(L)  Platelets 150 -  400 K/uL 222 198 210    CMP Latest Ref Rng & Units 01/10/2020 01/09/2020 01/08/2020  Glucose 70 - 99 mg/dL 98 116(H) 101(H)  BUN 6 - 20 mg/dL 81(H) 93(H) 104(H)  Creatinine 0.61 - 1.24 mg/dL 3.67(H) 3.90(H) 4.55(H)  Sodium 135 - 145 mmol/L 141 141 141  Potassium 3.5 - 5.1 mmol/L 3.8 3.8 3.9  Chloride 98 - 111 mmol/L 105 107 108  CO2 22 - 32 mmol/L 23 21(L) 19(L)  Calcium 8.9 - 10.3 mg/dL 8.0(L) 7.8(L) 7.9(L)  Total Protein 6.0 - 8.5 g/dL - - -  Total Bilirubin 0.0 - 1.2 mg/dL - - -  Alkaline Phos 44 - 121 IU/L - - -  AST 0 - 40 IU/L - - -  ALT 0 - 44 IU/L - - -    Assessment/Plan:   Principal Problem:   Volume overload Active Problems:   Type 2 diabetes mellitus without complication (HCC)   Essential hypertension   Asthma   AKI (acute kidney injury) ([place])   Acute on chronic congestive heart failure (HCC)   Hyperkalemia   Rectal bleeding   Iron deficiency anemia   Patient Summary: Craig West is a 54 y.o. man with a pertinent PMH of HFpEF, CKD stage 4, HTN, DM2, and asthma who presented with shortness of breath and orthopnea and was admitted for volume overload secondary to heart failure exacerbation associated with advanced kidney disease CKD stage IV.  This  is hospital day 4.   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 Came in 30lbs up from least admission and volume overloaded on exam. CXR with vascular congestion. Renal US unremarkable.  Cr 5.6-->5.0-->4.6-->3.9 --> 3.7 today (was 3.7 on 9/27). States he continues to feel better overall, feels almost back to baseline.  Weight is down 9 kg (20 lb) from admission with much improved abdominal swelling/distension. Tolerating aggressive diuresis. Will transition to PO Lasix 60 mg BID per nephrology and titrate from there.  - Nephrology has signed off, appreciate recs. Patient will follow-up in outpatient nephrology clinic (Dr. Johnney Ou) in ~4  weeks - Transition to PO Lasix 60 mg BID - Discontinue Sodium Bicarb 1300mg  BID (Bicarb 23 today) - K 3.8. Will give 40 mEq PO  - Renal diet - AM BMP  Mild iron deficiency anemia Bleeding per rectum Hemoglobin stable at 10.6 today. S/p iron infusion x 2 over the weekend. Patient denies any further episodes of bleeding per rectum. GI consulted and recommend outpatient GI follow-up for MD/APP visit vs direct colonoscopy. - Outpatient follow-up - AM CBC  HTN BP's 150s-180s/90-100s on amlodipine 10 mg (increased from home 5 mg). Given progress with diuresis, will slowly add back home antihypertensives, starting with carvedilol 12.5 mg BID. Continue to hold home hydralazine 50 BID and Imdur 60 mg daily.  - Continue amlodipine 5 mg daily - Restart cavedilol 12.5 mg BID  Diet: Carb/Renal IVF: None,None VTE: SCDs given rectal bleeding Code: Full   Please contact the on call pager after 5 pm and on weekends at [phone].  Alexandria Lodge, MD PGY-1 Internal Medicine Teaching Service Pager: [phone] 01/10/2020

## 2020-01-11 ENCOUNTER — Inpatient Hospital Stay (HOSPITAL_COMMUNITY): Payer: Medicaid Other | Admitting: Certified Registered Nurse Anesthetist

## 2020-01-11 ENCOUNTER — Encounter (HOSPITAL_COMMUNITY)
Admission: EM | Disposition: A | Discharge status: Home / Self Care | Payer: Self-pay | Source: Home / Self Care | Attending: Internal Medicine

## 2020-01-11 ENCOUNTER — Inpatient Hospital Stay (HOSPITAL_COMMUNITY): Payer: Medicaid Other

## 2020-01-11 ENCOUNTER — Other Ambulatory Visit: Payer: Self-pay

## 2020-01-11 ENCOUNTER — Encounter (HOSPITAL_COMMUNITY): Payer: Self-pay | Admitting: Internal Medicine

## 2020-01-11 DIAGNOSIS — D124 Benign neoplasm of descending colon: Secondary | ICD-10-CM

## 2020-01-11 DIAGNOSIS — D122 Benign neoplasm of ascending colon: Secondary | ICD-10-CM

## 2020-01-11 DIAGNOSIS — D125 Benign neoplasm of sigmoid colon: Secondary | ICD-10-CM

## 2020-01-11 DIAGNOSIS — C20 Malignant neoplasm of rectum: Secondary | ICD-10-CM

## 2020-01-11 DIAGNOSIS — K6289 Other specified diseases of anus and rectum: Secondary | ICD-10-CM

## 2020-01-11 DIAGNOSIS — K635 Polyp of colon: Secondary | ICD-10-CM

## 2020-01-11 HISTORY — PX: COLONOSCOPY WITH PROPOFOL: SHX5780

## 2020-01-11 HISTORY — PX: BIOPSY: SHX5522

## 2020-01-11 HISTORY — PX: POLYPECTOMY: SHX5525

## 2020-01-11 HISTORY — PX: SUBMUCOSAL TATTOO INJECTION: SHX6856

## 2020-01-11 LAB — CBC WITH DIFFERENTIAL/PLATELET
Abs Immature Granulocytes: 0.07 10*3/uL (ref 0.00–0.07)
Basophils Absolute: 0.1 10*3/uL (ref 0.0–0.1)
Basophils Relative: 0 %
Eosinophils Absolute: 0.3 10*3/uL (ref 0.0–0.5)
Eosinophils Relative: 2 %
HCT: 35.8 % — ABNORMAL LOW (ref 39.0–52.0)
Hemoglobin: 11.1 g/dL — ABNORMAL LOW (ref 13.0–17.0)
Immature Granulocytes: 1 %
Lymphocytes Relative: 11 %
Lymphs Abs: 1.5 10*3/uL (ref 0.7–4.0)
MCH: 28.2 pg (ref 26.0–34.0)
MCHC: 31 g/dL (ref 30.0–36.0)
MCV: 90.9 fL (ref 80.0–100.0)
Monocytes Absolute: 1.2 10*3/uL — ABNORMAL HIGH (ref 0.1–1.0)
Monocytes Relative: 9 %
Neutro Abs: 10.3 10*3/uL — ABNORMAL HIGH (ref 1.7–7.7)
Neutrophils Relative %: 77 %
Platelets: 233 10*3/uL (ref 150–400)
RBC: 3.94 MIL/uL — ABNORMAL LOW (ref 4.22–5.81)
RDW: 14.7 % (ref 11.5–15.5)
WBC: 13.3 10*3/uL — ABNORMAL HIGH (ref 4.0–10.5)
nRBC: 0 % (ref 0.0–0.2)

## 2020-01-11 LAB — BASIC METABOLIC PANEL
Anion gap: 13 (ref 5–15)
BUN: 67 mg/dL — ABNORMAL HIGH (ref 6–20)
CO2: 22 mmol/L (ref 22–32)
Calcium: 8.6 mg/dL — ABNORMAL LOW (ref 8.9–10.3)
Chloride: 108 mmol/L (ref 98–111)
Creatinine, Ser: 3.45 mg/dL — ABNORMAL HIGH (ref 0.61–1.24)
GFR, Estimated: 19 mL/min — ABNORMAL LOW (ref >60)
Glucose, Bld: 103 mg/dL — ABNORMAL HIGH (ref 70–99)
Potassium: 4.1 mmol/L (ref 3.5–5.1)
Sodium: 143 mmol/L (ref 135–145)

## 2020-01-11 LAB — GLUCOSE, CAPILLARY
Glucose-Capillary: 101 mg/dL — ABNORMAL HIGH (ref 70–99)
Glucose-Capillary: 95 mg/dL (ref 70–99)
Glucose-Capillary: 136 mg/dL — ABNORMAL HIGH (ref 70–99)
Glucose-Capillary: 91 mg/dL (ref 70–99)

## 2020-01-11 SURGERY — COLONOSCOPY WITH PROPOFOL
Anesthesia: Monitor Anesthesia Care

## 2020-01-11 MED ORDER — HYDRALAZINE HCL 50 MG PO TABS
50.0000 mg | ORAL_TABLET | Freq: Two times a day (BID) | ORAL | Status: DC
  Administered 2020-01-11 (×2): 50 mg via ORAL
  Filled 2020-01-11 (×2): qty 1

## 2020-01-11 MED ORDER — SPOT INK MARKER SYRINGE KIT
PACK | SUBMUCOSAL | Status: DC | PRN
  Administered 2020-01-11: 10 mL via SUBMUCOSAL

## 2020-01-11 MED ORDER — SODIUM CHLORIDE 0.9 % IV SOLN: INTRAVENOUS | Status: DC | PRN

## 2020-01-11 MED ORDER — AMLODIPINE BESYLATE 5 MG PO TABS
5.0000 mg | ORAL_TABLET | Freq: Every day | ORAL | Status: DC
  Administered 2020-01-11: 5 mg via ORAL
  Filled 2020-01-11: qty 1

## 2020-01-11 MED ORDER — SPOT INK MARKER SYRINGE KIT
PACK | SUBMUCOSAL | Status: AC
  Filled 2020-01-11: qty 5

## 2020-01-11 MED ORDER — PROPOFOL 10 MG/ML IV BOLUS
INTRAVENOUS | Status: DC | PRN
  Administered 2020-01-11: 25 mg via INTRAVENOUS
  Administered 2020-01-11: 30 mg via INTRAVENOUS
  Administered 2020-01-11: 25 mg via INTRAVENOUS

## 2020-01-11 MED ORDER — HYDRALAZINE HCL 20 MG/ML IJ SOLN
5.0000 mg | Freq: Once | INTRAMUSCULAR | Status: AC
  Administered 2020-01-11: 5 mg via INTRAVENOUS

## 2020-01-11 MED ORDER — GABAPENTIN 100 MG PO CAPS
100.0000 mg | ORAL_CAPSULE | Freq: Every day | ORAL | 0 refills | Status: DC
Start: 2020-01-11 — End: 2020-02-23

## 2020-01-11 MED ORDER — FUROSEMIDE 20 MG PO TABS
60.0000 mg | ORAL_TABLET | Freq: Two times a day (BID) | ORAL | 0 refills | Status: DC
Start: 2020-01-11 — End: 2020-02-23

## 2020-01-11 MED ORDER — LIDOCAINE 2% (20 MG/ML) 5 ML SYRINGE
INTRAMUSCULAR | Status: DC | PRN
  Administered 2020-01-11: 40 mg via INTRAVENOUS

## 2020-01-11 MED ORDER — HYDRALAZINE HCL 20 MG/ML IJ SOLN
INTRAMUSCULAR | Status: AC
  Filled 2020-01-11: qty 1

## 2020-01-11 MED ORDER — PROPOFOL 500 MG/50ML IV EMUL
INTRAVENOUS | Status: DC | PRN
  Administered 2020-01-11: 75 ug/kg/min via INTRAVENOUS

## 2020-01-11 SURGICAL SUPPLY — 22 items

## 2020-01-11 NOTE — Anesthesia Preprocedure Evaluation (Addendum)
Anesthesia Evaluation  Patient identified by MRN, date of birth, ID band Patient awake    Reviewed: Allergy & Precautions, H&P , NPO status , Patient's Chart, lab work & pertinent test results  Airway Mallampati: II  TM Distance: >3 FB Neck ROM: Full    Dental no notable dental hx. (+) Teeth Intact, Dental Advisory Given   Pulmonary neg pulmonary ROS, former smoker,    Pulmonary exam normal breath sounds clear to auscultation       Cardiovascular hypertension, Pt. on medications and Pt. on home beta blockers +CHF   Rhythm:Regular Rate:Normal     Neuro/Psych negative neurological ROS  negative psych ROS   GI/Hepatic negative GI ROS, Neg liver ROS,   Endo/Other  diabetes, Type 2, Oral Hypoglycemic Agents  Renal/GU Renal disease  negative genitourinary   Musculoskeletal   Abdominal   Peds  Hematology  (+) Blood dyscrasia, anemia ,   Anesthesia Other Findings   Reproductive/Obstetrics negative OB ROS                            Anesthesia Physical Anesthesia Plan  ASA: III  Anesthesia Plan: MAC   Post-op Pain Management:    Induction: Intravenous  PONV Risk Score and Plan: 1 and Propofol infusion and TIVA  Airway Management Planned: Simple Face Mask  Additional Equipment:   Intra-op Plan:   Post-operative Plan:   Informed Consent: I have reviewed the patients History and Physical, chart, labs and discussed the procedure including the risks, benefits and alternatives for the proposed anesthesia with the patient or authorized representative who has indicated his/her understanding and acceptance.     Dental advisory given  Plan Discussed with: CRNA  Anesthesia Plan Comments:         Anesthesia Quick Evaluation

## 2020-01-11 NOTE — Progress Notes (Signed)
DISCHARGE NOTE HOME Craig West to be discharged Home per MD order. Discussed prescriptions and follow up appointments with the patient. Medication list explained in detail. Patient verbalized understanding.  Skin clean, dry and intact without evidence of skin break down, no evidence of skin tears noted. IV catheter discontinued intact. Site without signs and symptoms of complications. Dressing and pressure applied. Pt denies pain at the site currently. No complaints noted.  Patient free of lines, drains, and wounds.   An After Visit Summary (AVS) was printed and given to the patient. Patient escorted via wheelchair, and discharged home via private auto.  Viviano Simas, RN

## 2020-01-11 NOTE — Interval H&P Note (Signed)
History and Physical Interval Note:  01/11/2020 9:08 AM  [name]  has presented today for surgery, with the diagnosis of anemia, limited bleeding pr.  The various methods of treatment have been discussed with the patient and family. After consideration of risks, benefits and other options for treatment, the patient has consented to  Procedure(s): COLONOSCOPY WITH PROPOFOL (N/A) as a surgical intervention.  The patient's history has been reviewed, patient examined, no change in status, stable for surgery.  I have reviewed the patient's chart and labs.  Questions were answered to the patient's satisfaction.     Craig West

## 2020-01-11 NOTE — Op Note (Addendum)
[facility] Patient Name: Craig West Procedure Date : 01/11/2020 MRN: [id number] Attending MD: Docia Chuck. Henrene Pastor , MD Date of Birth: [birth year] CSN: [id number] Age: 54 Admit Type: Inpatient Procedure:                Colonoscopy w/ cold snare polypectomy x 3;                            biopsies; submuc injection Indications:              Rectal bleeding, Iron deficiency anemia Providers:                Docia Chuck. Henrene Pastor, MD, Benetta Spar RN, RN, Laverda Sorenson, Technician, Pearline Cables, CRNA Referring MD:             [facility] hospitalist Medicines:                Monitored Anesthesia Care Complications:            No immediate complications. Estimated blood loss:                            None. Estimated Blood Loss:     Estimated blood loss: none. Procedure:                Pre-Anesthesia Assessment:                           - Prior to the procedure, a History and Physical                            was performed, and patient medications and                            allergies were reviewed. The patient's tolerance of                            previous anesthesia was also reviewed. The risks                            and benefits of the procedure and the sedation                            options and risks were discussed with the patient.                            All questions were answered, and informed consent                            was obtained. Prior Anticoagulants: The patient has                            taken no previous anticoagulant or antiplatelet  agents. ASA Grade Assessment: II - A patient with                            mild systemic disease. After reviewing the risks                            and benefits, the patient was deemed in                            satisfactory condition to undergo the procedure.                           After obtaining informed consent, the colonoscope                             was passed under direct vision. Throughout the                            procedure, the patient's blood pressure, pulse, and                            oxygen saturations were monitored continuously. The                            CF-HQ190L ([id number]) Olympus colonoscope was                            introduced through the anus and advanced to the the                            cecum, identified by appendiceal orifice and                            ileocecal valve. The ileocecal valve, appendiceal                            orifice, and rectum were photographed. The quality                            of the bowel preparation was good. The colonoscopy                            was performed without difficulty. The patient                            tolerated the procedure well. The bowel preparation                            used was SUPREP via split dose instruction. Scope In: 9:40:48 AM Scope Out: 10:04:23 AM Scope Withdrawal Time: 0 hours 19 minutes 9 seconds  Total Procedure Duration: 0 hours 23 minutes 35 seconds  Findings:      An ulcerated non-obstructing mass was found in the rectum, approximately       5 to  6 cm from the anal verge. The mass was non-circumferential. The       mass measured four cm in length and diameter. The surface was friable.       Multiple biopsies were taken with a cold forceps for histology. The area       was tattooed with an injection of Spot (carbon black) on each side of       the lesion just distal (downstream). See images.      Three polyps were found in the sigmoid colon, descending colon and       ascending colon. The polyps were 2 to 5 mm in size. These polyps were       removed with a cold snare. Resection and retrieval were complete.      Multiple diverticula were found in the left colon and right colon.       Internal hemorrhoids present.      The exam was otherwise without abnormality on direct and retroflexion        views. 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                            Biopsied. Tattooed.                           - Three 2 to 5 mm polyps in the sigmoid colon, in                            the descending colon and in the ascending colon,                            removed with a cold snare. Resected and retrieved.                           - Diverticulosis in the left colon and in the right                            colon. Internal hemorrhoids.                           - The examination was otherwise normal on direct                            and retroflexion views. Recommendation:           1. Follow-up biopsies                           2. CEA level                           3. Will need imaging to rule out metastatic                            disease. Significant renal insufficiency noted  4. Will need multidisciplinary oncology conference                            review                           5. Anticipate routine surveillance colonoscopy,                            with Dr. Havery Moros, in 1 year.                           Findings discussed with patient. He was provided a                            copy of this report. Inpatient GI team made aware                            of the findings, as well. Procedure Code(s):        --- Professional ---                           [phone], Colonoscopy, flexible; with removal of                            tumor(s), polyp(s), or other lesion(s) by snare                            technique                           45381, Colonoscopy, flexible; with directed                            submucosal injection(s), any substance                           20254, 39, Colonoscopy, flexible; with biopsy,                            single or multiple Diagnosis Code(s):        --- Professional ---                           C20, Malignant neoplasm of rectum                            K63.5, Polyp of colon                           K62.5, Hemorrhage of anus and rectum                           D50.9, Iron deficiency anemia, unspecified                           K57.30, Diverticulosis of large intestine without  perforation or abscess without bleeding CPT copyright 2019 American [employer]. All rights reserved. The codes documented in this report are preliminary and upon coder review may  be revised to meet current compliance requirements. Docia Chuck. Henrene Pastor, MD 01/11/2020 10:25:16 AM This report has been signed electronically. Number of Addenda: 0

## 2020-01-11 NOTE — Progress Notes (Signed)
HD#5 Subjective:   No acute events overnight.   Evaluated at bedside during rounds after patient returned from his colonoscopy. Patient aware of findings of colonoscopy, states, "I just have to deal with what comes." Denies additional blood in his stool.  Reports he is feeling much improved in terms of his breathing and abdominal swelling. States he feels back to baseline from that perspective. Discussed plan to obtain imaging to help facilitate patient's future follow-up with oncology.  Objective:   Vital signs in last 24 hours: Vitals:   01/11/20 1030 01/11/20 1036 01/11/20 1057 01/11/20 1616  BP:  (!) 169/105 (!) 162/109 131/78  Pulse: 78 80 77 79  Resp: (!) 30 (!) 25 18 18   Temp:   98.6 F (37 C) (!) 97.5 F (36.4 C)  TempSrc:   Oral Oral  SpO2: 99% 100% 100% 97%  Weight:      Height:       Physical Exam Constitutional: well-appearing gentleman sitting on the edge of bed, in no acute distress Cardiovascular: regular rate and rhythm, no m/r/g; No pretibial pitting edema today. Pulmonary/Chest: normal work of breathing on room air, no crackles appreciated, no wheezing Abdominal: Abdomen is soft and mildly distended, much improved from admission and improved from yesterday  Filed Weights   01/07/20 2146 01/09/20 0728 01/10/20 0835  Weight: 121.2 kg 113.5 kg 108.9 kg   Weight on admission: 117.9 kg (260 lb) Weight this morning: 108.9 kg (240 lb) Net weight change this admission: -9 kg (20 lb) No weight from today, 01/12/20   Intake/Output Summary (Last 24 hours) at 01/11/2020 1839 Last data filed at 01/11/2020 1235 Gross per 24 hour  Intake 1490 ml  Output 300 ml  Net 1190 ml   Net IO Since Admission: -9,345.67 mL [01/11/20 1839]  Pertinent Labs: CBC Latest Ref Rng & Units 01/11/2020 01/10/2020 01/09/2020  WBC 4.0 - 10.5 K/uL 13.3(H) 12.3(H) 11.0(H)  Hemoglobin 13.0 - 17.0 g/dL 11.1(L) 10.6(L) 10.4(L)  Hematocrit 39 - 52 % 35.8(L) 33.9(L) 33.8(L)  Platelets  150 - 400 K/uL 233 222 198    CMP Latest Ref Rng & Units 01/11/2020 01/10/2020 01/09/2020  Glucose 70 - 99 mg/dL 103(H) 98 116(H)  BUN 6 - 20 mg/dL 67(H) 81(H) 93(H)  Creatinine 0.61 - 1.24 mg/dL 3.45(H) 3.67(H) 3.90(H)  Sodium 135 - 145 mmol/L 143 141 141  Potassium 3.5 - 5.1 mmol/L 4.1 3.8 3.8  Chloride 98 - 111 mmol/L 108 105 107  CO2 22 - 32 mmol/L 22 23 21(L)  Calcium 8.9 - 10.3 mg/dL 8.6(L) 8.0(L) 7.8(L)  Total Protein 6.0 - 8.5 g/dL - - -  Total Bilirubin 0.0 - 1.2 mg/dL - - -  Alkaline Phos 44 - 121 IU/L - - -  AST 0 - 40 IU/L - - -  ALT 0 - 44 IU/L - - -     Assessment/Plan:   Principal Problem:   Volume overload Active Problems:   Type 2 diabetes mellitus without complication (HCC)   Essential hypertension   Asthma   AKI (acute kidney injury) ([place])   Acute on chronic congestive heart failure (HCC)   Hyperkalemia   Rectal bleeding   Iron deficiency anemia   Benign neoplasm of ascending colon   Benign neoplasm of descending colon   Benign neoplasm of sigmoid colon   Rectal mass   Patient Summary: Craig West is a 54 y.o. man with a pertinent PMH of HFpEF, CKD stage 4, HTN, DM2, and asthma who presented  with shortness of breath and orthopnea and was admitted for volume overload secondary to heart failure exacerbation associated with advanced kidney disease CKD stage IV.   This is hospital day 5. Anticipate discharge later this afternoon.   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 Came in 30lbs up from least admission and volume overloaded on exam. CXR with vascular congestion. Renal US unremarkable.  Cr 5.6-->5.0-->4.6-->3.9 --> 3.7 --> 3.5 today (was 3.7 on 9/27). Patient states he feels back to baseline today from a volume perspective. No weight obtained today. Weight is down 9 kg (20 lb) from admission with much improved abdominal swelling/distension. Tolerating transition to  PO Lasix 60 mg BID. Plan to discharge home today on PO Lasix 60 mg BID with follow-up as below. - Nephrology has signed off, appreciate recs. Patient will follow-up in outpatient nephrology clinic (Dr. 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 S/p iron infusion x 2 over the weekend. Patient denies any further episodes of bleeding per rectum. Colonoscopy by GI today demonstrated malignant tumor in the rectum which was biopsied, three 2 to 5 mm polyps in the sigmoid colon, in the descending colon and in the ascending colon which were removed with a cold snare. - CEA level and surgical pathology per GI - CT chest, abdomen, pelvis without contrast (given patient's kidney disease) to evaluate for metastatic disease prior to discharge home today - Patient will need multidisciplinary oncology conference review - Anticipate routine surveillance colonoscopy, with Dr. Havery Moros, in 1 year.  HTN BPs improved to 140s-150s with slow re-initiation of home antihypertensives. Resume remaining antihypertensives at discharge.  - amlodipine 5 mg daily - cavedilol 12.5 mg BID - hydralazine 50 mg BID - resume Imdur 60 mg daily at discharge  Diet: Carb/Renal VTE: SCDs given rectal bleeding Code: Full  Please contact the on call pager after 5 pm and on weekends at [phone].  Alexandria Lodge, MD PGY-1 Internal Medicine Teaching Service Pager: [phone] 01/11/2020

## 2020-01-11 NOTE — Plan of Care (Signed)
  Problem: Education: Goal: Knowledge of General Education information will improve Description: Including pain rating scale, medication(s)/side effects and non-pharmacologic comfort measures Outcome: Adequate for Discharge   Problem: Health Behavior/Discharge Planning: Goal: Ability to manage health-related needs will improve Outcome: Adequate for Discharge   Problem: Clinical Measurements: Goal: Ability to maintain clinical measurements within normal limits will improve Outcome: Adequate for Discharge Goal: Will remain free from infection Outcome: Adequate for Discharge Goal: Diagnostic test results will improve Outcome: Adequate for Discharge Goal: Respiratory complications will improve Outcome: Adequate for Discharge Goal: Cardiovascular complication will be avoided Outcome: Adequate for Discharge   Problem: Activity: Goal: Risk for activity intolerance will decrease Outcome: Adequate for Discharge   Problem: Nutrition: Goal: Adequate nutrition will be maintained Outcome: Adequate for Discharge   Problem: Coping: Goal: Level of anxiety will decrease Outcome: Adequate for Discharge   Problem: Elimination: Goal: Will not experience complications related to bowel motility Outcome: Adequate for Discharge Goal: Will not experience complications related to urinary retention Outcome: Adequate for Discharge   Problem: Pain Managment: Goal: General experience of comfort will improve Outcome: Adequate for Discharge   Problem: Safety: Goal: Ability to remain free from injury will improve Outcome: Adequate for Discharge   Problem: Skin Integrity: Goal: Risk for impaired skin integrity will decrease Outcome: Adequate for Discharge   Problem: Education: Goal: Knowledge of disease and its progression will improve Outcome: Adequate for Discharge   Problem: Fluid Volume: Goal: Fluid volume balance will be maintained or improved Outcome: Adequate for Discharge   Problem:  Urinary Elimination: Goal: Progression of disease will be identified and treated Outcome: Adequate for Discharge   Problem: Education: Goal: Ability to identify signs and symptoms of gastrointestinal bleeding will improve Outcome: Adequate for Discharge   Problem: Bowel/Gastric: Goal: Will show no signs and symptoms of gastrointestinal bleeding Outcome: Adequate for Discharge   Problem: Fluid Volume: Goal: Will show no signs and symptoms of excessive bleeding Outcome: Adequate for Discharge   Problem: Clinical Measurements: Goal: Complications related to the disease process, condition or treatment will be avoided or minimized Outcome: Adequate for Discharge

## 2020-01-11 NOTE — Transfer of Care (Signed)
Immediate Anesthesia Transfer of Care Note  Patient: Craig West  Procedure(s) Performed: COLONOSCOPY WITH PROPOFOL (N/A ) BIOPSY POLYPECTOMY SUBMUCOSAL TATTOO INJECTION  Patient Location: Endoscopy Unit  Anesthesia Type:MAC  Level of Consciousness: awake, alert , oriented and patient cooperative  Airway & Oxygen Therapy: Patient Spontanous Breathing and Patient connected to nasal cannula oxygen  Post-op Assessment: Report given to RN and Post -op Vital signs reviewed and stable  Post vital signs: Reviewed and stable  Last Vitals:  Vitals Value Taken Time  BP    Temp    Pulse    Resp    SpO2      Last Pain:  Vitals:   01/11/20 0849  TempSrc: Axillary  PainSc: 0-No pain         Complications: No complications documented.

## 2020-01-11 NOTE — Anesthesia Postprocedure Evaluation (Signed)
Anesthesia Post Note  Patient: Craig West  Procedure(s) Performed: COLONOSCOPY WITH PROPOFOL (N/A ) BIOPSY POLYPECTOMY SUBMUCOSAL TATTOO INJECTION     Patient location during evaluation: Endoscopy Anesthesia Type: MAC Level of consciousness: awake and alert Pain management: pain level controlled Vital Signs Assessment: post-procedure vital signs reviewed and stable Respiratory status: spontaneous breathing, nonlabored ventilation, respiratory function stable and patient connected to nasal cannula oxygen Cardiovascular status: stable and blood pressure returned to baseline Postop Assessment: no apparent nausea or vomiting Anesthetic complications: no   No complications documented.  Last Vitals:  Vitals:   01/11/20 1030 01/11/20 1036  BP:  (!) 169/105  Pulse: 78 80  Resp: (!) 30 (!) 25  Temp:    SpO2: 99% 100%    Last Pain:  Vitals:   01/11/20 1036  TempSrc:   PainSc: 0-No pain                 Misaki Sozio,W. EDMOND

## 2020-01-11 NOTE — Anesthesia Procedure Notes (Signed)
Procedure Name: MAC Date/Time: 01/11/2020 9:26 AM Performed by: Colin Benton, CRNA Pre-anesthesia Checklist: Patient identified, Emergency Drugs available, Suction available and Patient being monitored Patient Re-evaluated:Patient Re-evaluated prior to induction Oxygen Delivery Method: Nasal cannula Induction Type: IV induction Placement Confirmation: positive ETCO2 Dental Injury: Teeth and Oropharynx as per pre-operative assessment

## 2020-01-11 NOTE — Discharge Instructions (Signed)
Craig West,  It was a pleasure taking care of you during your hospital stay. During your stay you were found to have an exacerbation of your heart failure. We started giving you lasix to get your volume off. During your stay, you also had a bout of blood in your stool. Gastroenterology was consulted and a colonoscopy was performed, which showed a concern for malignancy. Please follow up with your primary care provider for referral to specialty services.    Heart Failure, Self Care Heart failure is a serious condition. This sheet explains things you need to do to take care of yourself at home. To help you stay as healthy as possible, you may be asked to change your diet, take certain medicines, and make other changes in your life. Your doctor may also give you more specific instructions. If you have problems or questions, call your doctor. What are the risks? Having heart failure makes it more likely for you to have some problems. These problems can get worse if you do not take good care of yourself. Problems may include:  Blood clotting problems. This may cause a stroke.  Damage to the kidneys, liver, or lungs.  Abnormal heart rhythms. Supplies needed:  Scale for weighing yourself.  Blood pressure monitor.  Notebook.  Medicines. How to care for yourself when you have heart failure Medicines Take over-the-counter and prescription medicines only as told by your doctor. Take your medicines every day.  Do not stop taking your medicine unless your doctor tells you to do so.  Do not skip any medicines.  Get your prescriptions refilled before you run out of medicine. This is important. Eating and drinking   Eat heart-healthy foods. Talk with a diet specialist (dietitian) to create an eating plan.  Choose foods that: ? Have no trans fat. ? Are low in saturated fat and cholesterol.  Choose healthy foods, such as: ? Fresh or frozen fruits and vegetables. ? Fish. ? Low-fat (lean)  meats. ? Legumes, such as beans, peas, and lentils. ? Fat-free or low-fat dairy products. ? Whole-grain foods. ? High-fiber foods.  Limit salt (sodium) if told by your doctor. Ask your diet specialist to tell you which seasonings are healthy for your heart.  Cook in healthy ways instead of frying. Healthy ways of cooking include roasting, grilling, broiling, baking, poaching, steaming, and stir-frying.  Limit how much fluid you drink, if told by your doctor. Alcohol use  Do not drink alcohol if: ? Your doctor tells you not to drink. ? Your heart was damaged by alcohol, or you have very bad heart failure. ? You are pregnant, may be pregnant, or are planning to become pregnant.  If you drink alcohol: ? Limit how much you use to:  0-1 drink a day for women.  0-2 drinks a day for men. ? Be aware of how much alcohol is in your drink. In the [place] one drink equals one 12 oz bottle of beer (355 mL), one 5 oz glass of wine (148 mL), or one 1 oz glass of hard liquor (44 mL). Lifestyle   Do not use any products that contain nicotine or tobacco, such as cigarettes, e-cigarettes, and chewing tobacco. If you need help quitting, ask your doctor. ? Do not use nicotine gum or patches before talking to your doctor.  Do not use illegal drugs.  Lose weight if told by your doctor.  Do physical activity if told by your doctor. Talk to your doctor before you begin an exercise  if: ? You are an older adult. ? You have very bad heart failure.  Learn to manage stress. If you need help, ask your doctor.  Get rehab (rehabilitation) to help you stay independent and to help with your quality of life.  Plan time to rest when you get tired. Check weight and blood pressure   Weigh yourself every day. This will help you to know if fluid is building up in your body. ? Weigh yourself every morning after you pee (urinate) and before you eat breakfast. ? Wear the same amount of clothing each  time. ? Write down your daily weight. Give your record to your doctor.  Check and write down your blood pressure as told by your doctor.  Check your pulse as told by your doctor. Dealing with very hot and very cold weather  If it is very hot: ? Avoid activities that take a lot of energy. ? Use air conditioning or fans, or find a cooler place. ? Avoid caffeine and alcohol. ? Wear clothing that is loose-fitting, lightweight, and light-colored.  If it is very cold: ? Avoid activities that take a lot of energy. ? Layer your clothes. ? Wear mittens or gloves, a hat, and a scarf when you go outside. ? Avoid alcohol. Follow these instructions at home:  Stay up to date with shots (vaccines). Get pneumococcal and flu (influenza) shots.  Keep all follow-up visits as told by your doctor. This is important. Contact a doctor if:  You gain weight quickly.  You have increasing shortness of breath.  You cannot do your normal activities.  You get tired easily.  You cough a lot.  You don't feel like eating or feel like you may vomit (nauseous).  You become puffy (swell) in your hands, feet, ankles, or belly (abdomen).  You cannot sleep well because it is hard to breathe.  You feel like your heart is beating fast (palpitations).  You get dizzy when you stand up. Get help right away if:  You have trouble breathing.  You or someone else notices a change in your behavior, such as having trouble staying awake.  You have chest pain or discomfort.  You pass out (faint). These symptoms may be an emergency. Do not wait to see if the symptoms will go away. Get medical help right away. Call your local emergency services (911 in the [place] Do not drive yourself to the hospital. Summary  Heart failure is a serious condition. To care for yourself, you may have to change your diet, take medicines, and make other lifestyle changes.  Take your medicines every day. Do not stop taking them  unless your doctor tells you to do so.  Eat heart-healthy foods, such as fresh or frozen fruits and vegetables, fish, lean meats, legumes, fat-free or low-fat dairy products, and whole-grain or high-fiber foods.  Ask your doctor if you can drink alcohol. You may have to stop alcohol use if you have very bad heart failure.  Contact your doctor if you gain weight quickly or feel that your heart is beating too fast. Get help right away if you pass out, or have chest pain or trouble breathing. This information is not intended to replace advice given to you by your health care provider. Make sure you discuss any questions you have with your health care provider. Document Revised: 06/30/2018 Document Reviewed: 07/01/2018 Elsevier Patient Education  [place].   Acute Kidney Injury, Adult  Acute kidney injury is a sudden worsening  of kidney function. The kidneys are organs that have several jobs. They filter the blood to remove waste products and extra fluid. They also maintain a healthy balance of minerals and hormones in the body, which helps control blood pressure and keep bones strong. With this condition, your kidneys do not do their jobs as well as they should. This condition ranges from mild to severe. Over time it may develop into long-lasting (chronic) kidney disease. Early detection and treatment may prevent acute kidney injury from developing into a chronic condition. What are the causes? Common causes of this condition include:  A problem with blood flow to the kidneys. This may be caused by: ? Low blood pressure (hypotension) or shock. ? Blood loss. ? Heart and blood vessel (cardiovascular) disease. ? Severe burns. ? Liver disease.  Direct damage to the kidneys. This may be caused by: ? Certain medicines. ? A kidney infection. ? Poisoning. ? Being around or in contact with toxic substances. ? A surgical wound. ? A hard, direct hit to the kidney area.  A sudden  blockage of urine flow. This may be caused by: ? Cancer. ? Kidney stones. ? An enlarged prostate in males. What are the signs or symptoms? Symptoms of this condition may not be obvious until the condition becomes severe. Symptoms of this condition can include:  Tiredness (lethargy), or difficulty staying awake.  Nausea or vomiting.  Swelling (edema) of the face, legs, ankles, or feet.  Problems with urination, such as: ? Abdominal pain, or pain along the side of your stomach (flank). ? Decreased urine production. ? Decrease in the force of urine flow.  Muscle twitches and cramps, especially in the legs.  Confusion or trouble concentrating.  Loss of appetite.  Fever. How is this diagnosed? This condition may be diagnosed with tests, including:  Blood tests.  Urine tests.  Imaging tests.  A test in which a sample of tissue is removed from the kidneys to be examined under a microscope (kidney biopsy). How is this treated? Treatment for this condition depends on the cause and how severe the condition is. In mild cases, treatment may not be needed. The kidneys may heal on their own. In more severe cases, treatment will involve:  Treating the cause of the kidney injury. This may involve changing any medicines you are taking or adjusting your dosage.  Fluids. You may need specialized IV fluids to balance your body's needs.  Having a catheter placed to drain urine and prevent blockages.  Preventing problems from occurring. This may mean avoiding certain medicines or procedures that can cause further injury to the kidneys. In some cases treatment may also require:  A procedure to remove toxic wastes from the body (dialysis or continuous renal replacement therapy - CRRT).  Surgery. This may be done to repair a torn kidney, or to remove the blockage from the urinary system. Follow these instructions at home: Medicines  Take over-the-counter and prescription medicines only as  told by your health care provider.  Do not take any new medicines without your health care provider's approval. Many medicines can worsen your kidney damage.  Do not take any vitamin and mineral supplements without your health care provider's approval. Many nutritional supplements can worsen your kidney damage. Lifestyle  If your health care provider prescribed changes to your diet, follow them. You may need to decrease the amount of protein you eat.  Achieve and maintain a healthy weight. If you need help with this, ask your health care  provider.  Start or continue an exercise plan. Try to exercise at least 30 minutes a day, 5 days a week.  Do not use any tobacco products, such as cigarettes, chewing tobacco, and e-cigarettes. If you need help quitting, ask your health care provider. General instructions  Keep track of your blood pressure. Report changes in your blood pressure as told by your health care provider.  Stay up to date with immunizations. Ask your health care provider which immunizations you need.  Keep all follow-up visits as told by your health care provider. This is important. Where to find more information  American Association of Kidney Patients: BombTimer.gl  National Kidney Foundation: www.kidney.[place]: https://mathis.com/  [employer] [facility]: ? www.lifeoptions.org ? www.kidneyschool.org Contact a health care provider if:  Your symptoms get worse.  You develop new symptoms. Get help right away if:  You develop symptoms of worsening kidney disease, which include: ? Headaches. ? Abnormally dark or light skin. ? Easy bruising. ? Frequent hiccups. ? Chest pain. ? Shortness of breath. ? End of menstruation in women. ? Seizures. ? Confusion or altered mental status. ? Abdominal or back pain. ? Itchiness.  You have a fever.  Your body is producing less urine.  You have pain or bleeding when you  urinate. Summary  Acute kidney injury is a sudden worsening of kidney function.  Acute kidney injury can be caused by problems with blood flow to the kidneys, direct damage to the kidneys, and sudden blockage of urine flow.  Symptoms of this condition may not be obvious until it becomes severe. Symptoms may include edema, lethargy, confusion, nausea or vomiting, and problems passing urine.  This condition can usually be diagnosed with blood tests, urine tests, and imaging tests. Sometimes a kidney biopsy is done to diagnose this condition.  Treatment for this condition often involves treating the underlying cause. It is treated with fluids, medicines, dialysis, diet changes, or surgery. This information is not intended to replace advice given to you by your health care provider. Make sure you discuss any questions you have with your health care provider. Document Revised: 02/28/2017 Document Reviewed: 03/08/2016 Elsevier Patient Education  2020 [employer].

## 2020-01-12 ENCOUNTER — Other Ambulatory Visit: Payer: Self-pay

## 2020-01-12 ENCOUNTER — Telehealth: Payer: Self-pay

## 2020-01-12 DIAGNOSIS — C189 Malignant neoplasm of colon, unspecified: Secondary | ICD-10-CM

## 2020-01-12 LAB — CEA: CEA: 2.8 ng/mL (ref 0.0–4.7)

## 2020-01-12 MED FILL — FUROSEMIDE 20 MG TABS: 20 | 30 days supply | Qty: 180 | Fill #0

## 2020-01-12 MED FILL — GABAPENTIN 100 MG CAPSULE: 100 | 30 days supply | Qty: 30 | Fill #0

## 2020-01-12 NOTE — Telephone Encounter (Signed)
Craig Shipper, MD  Algernon Huxley, RN Cc: Havery Moros Carlota Raspberry, MD [facility] inpatient (Dr. Havery Moros primary GI) who underwent colonoscopy yesterday. I reviewed with him the findings of the procedure, provided him a copy of the procedure note, and spoke with his niece Jonelle Sidle. Please let him know that the biopsies confirmed cancer as expected. It looks like he may have been discharged before our inpatient team saw him. He needs follow-up with oncology ASAP (notify oncology navigator). Put him in for recall colonoscopy with Dr. Havery Moros in 1 year.    Oncology referral entered in epic, oncology navigator notified, recall entered in epic for repeat colon in 1 year. Have attempted to call pt and phone rings voicemail not set up and cannot leave message. Will try again.

## 2020-01-12 NOTE — Discharge Summary (Signed)
Name: Craig West MRN: [id number] DOB: 12-31-1965 54 y.o. PCP: Craig Rakes, MD  Date of Admission: 01/06/2020  4:10 PM Date of Discharge: 01/11/2020 Attending Physician: Craig West  Discharge Diagnosis: 1. Volume overload 2. Acute on chronic congestive heart failure with preserved ejection fraction 3. Acute on chronic kidney disease 4. Iron deficiency anemia 5. Rectal bleeding 6. Rectal mass  rectal adenocarcinoma 7. Benign neoplasms of ascending, descending, and sigmoid colon  Discharge Medications: Allergies as of 01/11/2020   No Known Allergies     Medication List    STOP taking these medications   HYDROcodone-acetaminophen 5-325 MG tablet Commonly known as: NORCO/VICODIN   isosorbide mononitrate 60 MG 24 hr tablet Commonly known as: IMDUR     TAKE these medications   albuterol 108 (90 Base) MCG/ACT inhaler Commonly known as: VENTOLIN HFA Inhale 2 puffs into the lungs every 6 (six) hours as needed for wheezing or shortness of breath.   allopurinol 100 MG tablet Commonly known as: ZYLOPRIM Take 200 mg by mouth daily.   amLODipine 5 MG tablet Commonly known as: NORVASC Take 1 tablet (5 mg total) by mouth daily. For hypertension   atorvastatin 40 MG tablet Commonly known as: LIPITOR Take 1 tablet (40 mg total) by mouth daily. For hypercholesterolemia   carvedilol 12.5 MG tablet Commonly known as: COREG Take 1 tablet (12.5 mg total) by mouth 2 (two) times daily with West meal.   colchicine 0.6 MG tablet Take 2 tablets (1.2 mg) orally at the onset of West gout attack; may repeat 1 tablet (0.6 mg) in 2 hours if symptoms persist What changed:   how much to take  how to take this  when to take this  additional instructions   Digital Glass Scale Misc Use scale to weight yourself. Increasing weight may indicate fluid overload   furosemide 20 MG tablet Commonly known as: LASIX Take 3 tablets (60 mg total) by mouth 2 (two) times daily. What  changed:   medication strength  how much to take  when to take this   gabapentin 100 MG capsule Commonly known as: NEURONTIN Take 1 capsule (100 mg total) by mouth at bedtime. What changed:   medication strength  how much to take  when to take this  additional instructions   glipiZIDE 10 MG tablet Commonly known as: GLUCOTROL Take 1 tablet (10 mg total) by mouth 2 (two) times daily before West meal.   hydrALAZINE 50 MG tablet Commonly known as: APRESOLINE Take 1 tablet (50 mg total) by mouth every 12 (twelve) hours.   tamsulosin 0.4 MG Caps capsule Commonly known as: FLOMAX Take 1 capsule (0.4 mg total) by mouth daily.   True Metrix Blood Glucose Test test strip Generic drug: glucose blood Use as instructed   True Metrix Meter w/Device Kit Check blood sugar fasting and ant bedtime and record   TRUEplus Lancets 28G Misc Check blood sugar fasting and at bedtime       Disposition and follow-up:   Craig West was discharged from [facility] in stable condition.  At the hospital follow up visit please address:  1.    - Acute on chronic kidney disease: Seen and evaluated by nephrology during hospitalization. Discharged on PO Lasix 60 mg twice daily. Adjust as needed. He is supposed to follow-up with Craig West in 4 weeks. Serum creatinine 3.5 on discharge vs 5.6 on admission. - Acute on chronic congestive heart failure with preserved ejection fraction: Evaluated by cardiology during  this admission. Ensure cardiology follow-up. - Rectal adenocarcinoma  Iron deficiency anemia: Patient will require referral to multidisciplinary oncology services. CT Chest, abdomen, and pelvis without contrast was obtained prior to patient's discharge. See results below. - Benign neoplasms of ascending, descending, and sigmoid colon: He will require recall colonoscopy with Dr. Armbruster ([facility]) in 1 year.  2.  Labs / imaging needed at time of follow-up: BMP,  CBC  3.  Pending labs/ test needing follow-up: none  Follow-up Appointments:  Follow-up Information    Newlin, Enobong, MD. Schedule an appointment as soon as possible for West visit in 1 week(s).   Specialty: Family Medicine Why: for hospital follow up and to discuss colonoscopy findings.  Contact information: [address] [place] [address] [phone]        Kimberly, Tiffany, MD .   Specialty: Cardiology Contact information: [address] [place] [address] [phone]        Kruska, Lindsay A, MD. Schedule an appointment as soon as possible for West visit in 4 week(s).   Specialty: Internal Medicine Contact information: [address] [place]  [address] [phone]               Hospital Course by problem list:  Craig West is West 54 y.o. man with West pertinent PMH of HFpEF, CKD stage 4, HTN, DM2, and asthma who presented with shortness of breath and orthopnea and was admitted for volume overload secondary to heart failure exacerbation associated with advanced kidney disease CKD stage IV.   Volume overload AKI on CKD IV Acute on Chronic HFpEF Metabolic acidosis  Patient presented up 30 lb compared to his recent discharge from the hospital on 12/27/19. Carries extra fluid in his abdomen > lower extremities. Patient's CKD most likely secondary to diabetic and hypertensive nephropathy. He had multiple referrals to outpatient nephrology in the past however was unable to see them due to cost. At last admission (12/23/19-12/27/19) had progression of CKD to stage 4 vs AKI. On this admission, presented with continued worsening of creatinine to 5.6, BUN of 110. Had Echo in September 2021 with LVEF 60-65%, grade 2 diastolic dysfunction, read as concern for infiltrative cardiomyopathy. However, cardiology felt much more likely to be hypertensive in etiology and recommended indefinite deferral of infiltrative workup. Patient has been unable to get cardiac MRI  due to renal function. Reported adherence to home Lasix PO 40mg daily.  Nephrology and cardiology consulted on admission. Nephrology did not feel patient required dialysis. Patient diuresed with Lasix 80 mg IV twice daily with good response. He was net negative ~10L this admission. His serum creatinine improved from 5.6 on admission to 3.45 on the day of discharge. Renal ultrasound obtained was unremarkable. Volume overload suspected cardiorenal in etiology. Patient was transitioned to Lasix 60 mg PO twice daily prior to discharge. He will follow-up in ~4 weeks in Dr. Kruska's outpatient clinic.   Rectal adenocarcinoma Mild iron deficiency anemia Bleeding per rectum Patient found to have mild normocytic anemia (hemoglobin 10.1 with MCV 92.2) on admission. Iron studies consistent with iron deficiency anemia, so received 2 doses of IV iron per nephrology. Patient later had an episode of bright red blood per rectum in the setting of passing West hard stool. Hemoccult was positive. GI was consulted. Given recent bleeding per rectum, iron deficiency anemia, and drop in hemoglobin of 3 to 4 over the preceding 1-2 months, patient was taken for colonoscopy by Dr. Perry on 01/11/20. Findings significant for malignant tumor   in the rectum which was biopsied, three 2 to 5 mm polyps in the sigmoid colon, in the descending colon and in the ascending colon which were removed with West cold snare. CEA level obtained per GI (2.8). Surgical pathology resulted on 01/12/20 confirmed rectal adenocarcinoma. Patient will need follow-up with oncology as soon as possible. CT Chest, abdomen, and pelvis without contrast was obtained prior to patient's discharge.  HTN Patient had recent admission 12/23/19-12/27/19 for severe symptomatic hypertension in the setting of medication non-adherence due to cost. Reports he has since been taking his medications daily and has had controlled BP recently. Due to normotension on admission, his initial  home antihypertensives (amlodipine 10mg, Coreg 12.5mg BID, hydralazine 50mg BID, Imdur 50mg daily) were held. During this admission, his home antihypertensives were gradually added back, and he was discharged home on his previous regimen.   Discharge Vitals:   BP 131/78 (BP Location: Left Arm)   Pulse 79   Temp (!) 97.5 F (36.4 C) (Oral)   Resp 18   Ht 6' 1" (1.854 m)   Wt 108.9 kg   SpO2 97%   BMI 31.66 kg/m   Pertinent Labs, Studies, and Procedures:   01/11/20 Colonoscopy biopsy surgical pathology report:    Clinical History: Anemia, limited bleeding pr; r/o cancer (nt)   FINAL MICROSCOPIC DIAGNOSIS:   West. COLON, ASCENDING, DESCENDING AND SIGMOID, POLYPECTOMY:  - Tubular adenoma(s) without high-grade dysplasia or malignancy  - Hyperplastic polyp(s)   B. RECTUM, BIOPSY:  - Adenocarcinoma. See comment   COMMENT:   B.  Dr. Patrick reviewed the case and concurs with the diagnosis.  Immunohistochemical stains for MMR-related proteins are pending and will  be reported in an addendum.    01/11/20 CT CHEST, ABDOMEN AND PELVIS WITHOUT CONTRAST  TECHNIQUE: Multidetector CT imaging of the chest, abdomen and pelvis was performed following the standard protocol without IV contrast. Examination is somewhat limited due to lack of IV contrast.  COMPARISON:  None.  FINDINGS: CT CHEST FINDINGS  Cardiovascular: Somewhat limited due to lack of IV contrast. No atherosclerotic calcifications are seen. No aneurysmal dilatation of the aorta is noted. Heart is not significantly enlarged. Pulmonary outflow tract appears within normal limits. Scattered coronary calcifications are noted.  Mediastinum/Nodes: Thoracic inlet is within normal limits. No sizable hilar or mediastinal adenopathy is noted. The esophagus as visualized is within normal limits.  Lungs/Pleura: The lungs are well aerated bilaterally. West moderate right-sided pleural effusion is noted. Mild  basilar atelectasis/infiltrate is noted in the right lower lobe. No definitive parenchymal nodules are seen. No gross metastatic disease is seen.  Musculoskeletal: Bony structures of the chest show degenerative change of the thoracic spine. No other significant abnormality is noted  CT ABDOMEN PELVIS FINDINGS  Hepatobiliary: Gallbladder is decompressed. Liver is well visualized although evaluation is somewhat limited due to lack of IV contrast. No definitive mass lesion is seen.  Pancreas: Unremarkable. No pancreatic ductal dilatation or surrounding inflammatory changes.  Spleen: Normal in size without focal abnormality.  Adrenals/Urinary Tract: Adrenal glands are within normal limits. Kidneys demonstrate no renal calculi or obstructive changes. The bladder is decompressed.  Stomach/Bowel: Wall thickening is noted within the rectum consistent with the given clinical history of rectal mass. Scattered diverticular changes noted without evidence of diverticulitis. The appendix is within normal limits. Small bowel and stomach are unremarkable.  Vascular/Lymphatic: Abdominal aorta is within normal limits. 13 mm node is noted adjacent to the distal aspect of the external iliac vein on the left.   Similar node is noted on the right but slightly smaller in size measuring 10 mm. West few scattered small nodes are noted adjacent to the rectum. The largest of these measures 7 mm in short axis. Few scattered small inguinal lymph nodes are noted but not sizable.  Reproductive: Prostate is unremarkable.  Other: No abdominal wall hernia or abnormality. No abdominopelvic ascites.  Musculoskeletal: No acute or significant osseous findings.  IMPRESSION: Changes consistent with the known history of rectal mass. Some associated small lymph nodes are noted in the perirectal fat as well as small external iliac lymph nodes as described. PET-CT may be helpful for further evaluation as  clinically indicated.  Moderate right-sided pleural effusion. Mild right lower lobe atelectatic change/early infiltrate.  No definitive hepatic metastatic disease is seen. No other focal abnormality is noted.   Discharge Instructions: Discharge Instructions    Call MD for:  difficulty breathing, headache or visual disturbances   Complete by: As directed    Call MD for:  persistant dizziness or light-headedness   Complete by: As directed    Call MD for:  persistant nausea and vomiting   Complete by: As directed    Call MD for:  redness, tenderness, or signs of infection (pain, swelling, redness, odor or green/yellow discharge around incision site)   Complete by: As directed    Call MD for:  severe uncontrolled pain   Complete by: As directed    Diet - low sodium heart healthy   Complete by: As directed    Increase activity slowly   Complete by: As directed      Mr. Beadnell,  It was West pleasure taking care of you during your hospital stay. During your stay you were found to have an exacerbation of your heart failure. We started giving you lasix to get your volume off. During your stay, you also had West bout of blood in your stool. Gastroenterology was consulted and West colonoscopy was performed, which showed West concern for malignancy. Please follow up with your primary care provider for referral to specialty services.   Signed: Alexandria Lodge, MD 01/12/2020, 4:59 PM   Pager: [phone]

## 2020-01-12 NOTE — Telephone Encounter (Signed)
-----   Message from Irene Shipper, MD sent at 01/12/2020  2:06 PM EDT ----- Craig West inpatient (Dr. Havery Moros primary GI) who underwent colonoscopy yesterday.  I reviewed with him the findings of the procedure, provided him a copy of the procedure note, and spoke with his niece Jonelle Sidle.  Please let him know that the biopsies confirmed cancer as expected.  It looks like he may have been discharged before our inpatient team saw him.  He needs follow-up with oncology ASAP (notify oncology navigator).  Put him in for recall colonoscopy with Dr. Havery Moros in 1 year.

## 2020-01-12 NOTE — Telephone Encounter (Signed)
Transition Care Management Follow-up Telephone Call Date of discharge and from where: 01/11/2020, [facility]   Call placed to patient # [phone], unable to leave a message, voicemail not set up. Call placed to patient's mother, Arbie Cookey and she said that she has no other way to contact the patient, only the phone number on record.   He needs to schedule follow up appointment with PCP.

## 2020-01-13 ENCOUNTER — Telehealth: Payer: Self-pay | Admitting: Cardiovascular Disease

## 2020-01-13 ENCOUNTER — Encounter (HOSPITAL_COMMUNITY): Payer: Self-pay | Admitting: Internal Medicine

## 2020-01-13 ENCOUNTER — Telehealth: Payer: Self-pay

## 2020-01-13 NOTE — Telephone Encounter (Signed)
Transition Care Management Follow-up Telephone Call Date of discharge and from where: 01/11/2020, [facility] [place]   2X Call placed to patient # [phone], unable to leave a message, voicemail not set up  He needs to schedule follow up appointment with PCP.

## 2020-01-13 NOTE — Telephone Encounter (Signed)
° ° °  Pt said he was discharged 2 days ago and need a f/u with Dr. Oval Linsey or APP. First available is on 11/24 for in office visit. Pt said he needs to be seen sooner than that he doesn't want virtual appt

## 2020-01-13 NOTE — Telephone Encounter (Signed)
Spoke with patient and scheduled him for 01/20/20 @ 0845 with Isaac Laud PA

## 2020-01-19 ENCOUNTER — Other Ambulatory Visit: Payer: Self-pay | Admitting: Radiation Oncology

## 2020-01-19 ENCOUNTER — Other Ambulatory Visit: Payer: Self-pay

## 2020-01-19 DIAGNOSIS — I10 Essential (primary) hypertension: Secondary | ICD-10-CM

## 2020-01-19 DIAGNOSIS — C189 Malignant neoplasm of colon, unspecified: Secondary | ICD-10-CM

## 2020-01-19 DIAGNOSIS — C20 Malignant neoplasm of rectum: Secondary | ICD-10-CM

## 2020-01-19 LAB — SURGICAL PATHOLOGY

## 2020-01-19 NOTE — Progress Notes (Signed)
GI Location of Tumor / Histology: Rectal Cancer  Craig West presented with blood in his stool.  Colonoscopy 01/11/2020: Malignant tumor in the rectum.  Three 2 to 5 mm polyps in the sigmoid colon, in the descending colon and in the ascending colon.  CT CAP 01/11/2020: Changes consistent with the known history of rectal mass. Some associated small lymph nodes are noted in the perirectal fat as well as small external iliac lymph nodes as described.  Biopsies of Colon and Rectum 01/11/2020   Past/Anticipated interventions by surgeon, if any:   Past/Anticipated interventions by medical oncology, if any:  No appointments set up yet.  Weight changes, if any: No  Bowel/Bladder complaints, if any: He has some urgency with his bowels.  Has occasional constipation.  Nausea / Vomiting, if any: no  Pain issues, if any:  no  Any blood per rectum:  He continues to have small amounts of blood in his stool.  Bright red in color.  SAFETY ISSUES:  Prior radiation? No  Pacemaker/ICD? no  Possible current pregnancy? n/a  Is the patient on methotrexate? no  Current Complaints/Details:

## 2020-01-19 NOTE — Telephone Encounter (Signed)
Dr. Havery Moros was able to get in touch with the pt, see result note.

## 2020-01-20 ENCOUNTER — Ambulatory Visit
Admission: RE | Admit: 2020-01-20 | Discharge: 2020-01-20 | Disposition: A | Discharge status: Home / Self Care | Payer: MEDICAID | Source: Ambulatory Visit | Attending: Radiation Oncology | Admitting: Radiation Oncology

## 2020-01-20 ENCOUNTER — Other Ambulatory Visit: Payer: Self-pay

## 2020-01-20 ENCOUNTER — Encounter: Payer: Self-pay | Admitting: Radiation Oncology

## 2020-01-20 ENCOUNTER — Telehealth: Payer: Self-pay | Admitting: *Deleted

## 2020-01-20 ENCOUNTER — Telehealth: Payer: Self-pay | Admitting: General Practice

## 2020-01-20 ENCOUNTER — Encounter: Payer: Self-pay | Admitting: Physician Assistant

## 2020-01-20 VITALS — Ht 73.0 in | Wt 240.0 lb

## 2020-01-20 DIAGNOSIS — C20 Malignant neoplasm of rectum: Secondary | ICD-10-CM

## 2020-01-20 NOTE — Telephone Encounter (Signed)
Called patient to inform of MRI for 01-26-20- arrival time- 7:30 am @ [facility], patient to be NPO- 4 hrs. Prior to test, spoke with patient's mom and she is aware of this test

## 2020-01-20 NOTE — Progress Notes (Signed)
Radiation Oncology         [phone]) [phone] ________________________________  Initial Outpatient Consultation - Conducted via telephone due to current COVID-19 concerns for limiting patient exposure  I spoke with the patient to conduct this consult visit via telephone to spare the patient unnecessary potential exposure in the healthcare setting during the current COVID-19 pandemic. The patient was notified in advance and was offered a [facility] meeting to allow for face to face communication but unfortunately reported that they did not have the appropriate resources/technology to support such a visit and instead preferred to proceed with a telephone consult.   Name: Craig West        MRN: [id number]  Date of Service: 01/20/2020 DOB: 23-Feb-1966  ZJ:QBHALP, Charlane Ferretti, MD  Armbruster, Carlota Raspberry, MD     REFERRING PHYSICIAN: Havery Moros Carlota Raspberry, MD   DIAGNOSIS: The encounter diagnosis was Adenocarcinoma of rectum [facility]).   HISTORY OF PRESENT ILLNESS: Craig West is a 54 y.o. male seen at the request of Dr. Havery Moros and Dr. Henrene Pastor for a newly diagnosed adenocarcinoma of the rectum. The patient presented to the ED on 01/06/20 with acute on chronic congestive heart failure, acute on chronic kidney disease, anemia and rectal bleeding. He proceeded with colonoscopy on 01/11/20 that revealed an ulcerated, non-obstructing mass in the rectum 5-6 cm from the anal verge. The mass was not circumferential and measured 4 cm in length and diameter with a friable surface. The area was tattooed on each side of the lesion just distal. Three polyps in the sigmoid, descending, and ascending colon were noted measuring 2-5 mm and well as diverticular disease. The rectal biopsy returned showing adenocarcinoma that was MSI stable.  He did have CT scans of the chest abdomen and pelvis that day that showed no distant metastatic disease but did show changes consistent with his known rectal mass and small perirectal and external liliac  nodes that were suspicious. He is contacted today to discuss options of treatment for his cancer. He's set up for evaluation with Dr. Marcello Moores in [place] on Tuesday next week, MRI pelvis with rectal protocol on Wednesday, and to see Dr. Benay Spice on Thursday.     PREVIOUS RADIATION THERAPY: No   PAST MEDICAL HISTORY:  Past Medical History:  Diagnosis Date  . Asthma   . CHF (congestive heart failure) ([place])   . Diabetes mellitus without complication ([place])   . Gout   . Hypertension        PAST SURGICAL HISTORY: Past Surgical History:  Procedure Laterality Date  . BIOPSY  01/11/2020   Procedure: BIOPSY;  Surgeon: Irene Shipper, MD;  Location: [facility] ENDOSCOPY;  Service: Endoscopy;;  . COLONOSCOPY WITH PROPOFOL N/A 01/11/2020   Procedure: COLONOSCOPY WITH PROPOFOL;  Surgeon: Irene Shipper, MD;  Location: [place];  Service: Endoscopy;  Laterality: N/A;  . NO PAST SURGERIES    . POLYPECTOMY  01/11/2020   Procedure: POLYPECTOMY;  Surgeon: Irene Shipper, MD;  Location: [facility] ENDOSCOPY;  Service: Endoscopy;;  . SUBMUCOSAL TATTOO INJECTION  01/11/2020   Procedure: SUBMUCOSAL TATTOO INJECTION;  Surgeon: Irene Shipper, MD;  Location: [facility] ENDOSCOPY;  Service: Endoscopy;;     FAMILY HISTORY:  Family History  Problem Relation Age of Onset  . Heart failure Brother      SOCIAL HISTORY:  reports that he quit smoking about 2 months ago. His smoking use included cigarettes. He smoked 0.50 packs per day. He has never used smokeless tobacco. He reports current alcohol use. He reports  that he does not use drugs. The patient is single and lives in [place]. 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    ALLERGIES: Patient has no known allergies.   MEDICATIONS:  Current Outpatient Medications  Medication Sig Dispense Refill  . albuterol (VENTOLIN HFA) 108 (90 Base) MCG/ACT inhaler Inhale 2 puffs into the lungs every 6 (six) hours as needed for wheezing or shortness of  breath. 18 g 2  . allopurinol (ZYLOPRIM) 100 MG tablet Take 200 mg by mouth daily.    Marland Kitchen amLODipine (NORVASC) 5 MG tablet Take 1 tablet (5 mg total) by mouth daily. For hypertension 30 tablet 3  . atorvastatin (LIPITOR) 40 MG tablet Take 1 tablet (40 mg total) by mouth daily. For hypercholesterolemia 30 tablet 2  . Blood Glucose Monitoring Suppl (TRUE METRIX METER) w/Device KIT Check blood sugar fasting and ant bedtime and record 1 kit 0  . carvedilol (COREG) 12.5 MG tablet Take 1 tablet (12.5 mg total) by mouth 2 (two) times daily with a meal. 60 tablet 2  . colchicine 0.6 MG tablet Take 2 tablets (1.2 mg) orally at the onset of a gout attack; may repeat 1 tablet (0.6 mg) in 2 hours if symptoms persist (Patient taking differently: Take 0.6 mg by mouth See admin instructions. Take 2 tablets (1.2 mg) orally at the onset of a gout attack; may repeat 1 tablet (0.6 mg) in 2 hours if symptoms persist As needed for Gout flare up.) 30 tablet 2  . furosemide (LASIX) 20 MG tablet Take 3 tablets (60 mg total) by mouth 2 (two) times daily. 180 tablet 0  . gabapentin (NEURONTIN) 100 MG capsule Take 1 capsule (100 mg total) by mouth at bedtime. 30 capsule 0  . glipiZIDE (GLUCOTROL) 10 MG tablet Take 1 tablet (10 mg total) by mouth 2 (two) times daily before a meal. 60 tablet 2  . glucose blood (TRUE METRIX BLOOD GLUCOSE TEST) test strip Use as instructed 200 each 12  . hydrALAZINE (APRESOLINE) 50 MG tablet Take 1 tablet (50 mg total) by mouth every 12 (twelve) hours. 60 tablet 2  . Misc. Devices (DIGITAL GLASS SCALE) MISC Use scale to weight yourself. Increasing weight may indicate fluid overload 1 each 0  . tamsulosin (FLOMAX) 0.4 MG CAPS capsule Take 1 capsule (0.4 mg total) by mouth daily. 30 capsule 2  . TRUEplus Lancets 28G MISC Check blood sugar fasting and at bedtime 210 each 2   No current facility-administered medications for this encounter.     REVIEW OF SYSTEMS: On review of systems, the patient  reports that he has occasional red blood in his stool but denies any pelvic pain or pressure. He does not fecal urgency at times which alternates with episodes of constipation and urge without BM. He reports his breathing has improved since his hospitalization, and denies any chest pain, cough, fevers, chills, night sweats, unintended weight changes. HE denies any  bladder disturbances, and denies abdominal pain, nausea or vomiting. He denies any new musculoskeletal or joint aches or pains. A complete review of systems is obtained and is otherwise negative.     PHYSICAL EXAM:  Wt Readings from Last 3 Encounters:  01/20/20 240 lb (108.9 kg)  01/10/20 240 lb (108.9 kg)  01/06/20 268 lb (121.6 kg)   Pain Assessment Pain Score: 0-No pain/10  Unable to assess due to encounter type.  ECOG = 1  0 - Asymptomatic (Fully active, able to carry on  all predisease activities without restriction)  1 - Symptomatic but completely ambulatory (Restricted in physically strenuous activity but ambulatory and able to carry out work of a light or sedentary nature. For example, light housework, office work)  2 - Symptomatic, <50% in bed during the day (Ambulatory and capable of all self care but unable to carry out any work activities. Up and about more than 50% of waking hours)  3 - Symptomatic, >50% in bed, but not bedbound (Capable of only limited self-care, confined to bed or chair 50% or more of waking hours)  4 - Bedbound (Completely disabled. Cannot carry on any self-care. Totally confined to bed or chair)  5 - Death   Eustace Pen MM, Creech RH, Tormey DC, et al. [phone]). "Toxicity and response criteria of the [facility]". [place] Oncol. 5 (6): 649-55    LABORATORY DATA:  Lab Results  Component Value Date   WBC 13.3 (H) 01/11/2020   HGB 11.1 (L) 01/11/2020   HCT 35.8 (L) 01/11/2020   MCV 90.9 01/11/2020   PLT 233 01/11/2020   Lab Results  Component Value Date   NA 143  01/11/2020   K 4.1 01/11/2020   CL 108 01/11/2020   CO2 22 01/11/2020   Lab Results  Component Value Date   ALT 52 (H) 01/03/2020   AST 32 01/03/2020   ALKPHOS 207 (H) 01/03/2020   BILITOT 0.3 01/03/2020      RADIOGRAPHY: CT ABDOMEN PELVIS WO CONTRAST  Result Date: 01/11/2020 CLINICAL DATA:  Known rectal mass, evaluate for possible metastatic disease EXAM: CT CHEST, ABDOMEN AND PELVIS WITHOUT CONTRAST TECHNIQUE: Multidetector CT imaging of the chest, abdomen and pelvis was performed following the standard protocol without IV contrast. Examination is somewhat limited due to lack of IV contrast. COMPARISON:  None. FINDINGS: CT CHEST FINDINGS Cardiovascular: Somewhat limited due to lack of IV contrast. No atherosclerotic calcifications are seen. No aneurysmal dilatation of the aorta is noted. Heart is not significantly enlarged. Pulmonary outflow tract appears within normal limits. Scattered coronary calcifications are noted. Mediastinum/Nodes: Thoracic inlet is within normal limits. No sizable hilar or mediastinal adenopathy is noted. The esophagus as visualized is within normal limits. Lungs/Pleura: The lungs are well aerated bilaterally. A moderate right-sided pleural effusion is noted. Mild basilar atelectasis/infiltrate is noted in the right lower lobe. No definitive parenchymal nodules are seen. No gross metastatic disease is seen. Musculoskeletal: Bony structures of the chest show degenerative change of the thoracic spine. No other significant abnormality is noted CT ABDOMEN PELVIS FINDINGS Hepatobiliary: Gallbladder is decompressed. Liver is well visualized although evaluation is somewhat limited due to lack of IV contrast. No definitive mass lesion is seen. Pancreas: Unremarkable. No pancreatic ductal dilatation or surrounding inflammatory changes. Spleen: Normal in size without focal abnormality. Adrenals/Urinary Tract: Adrenal glands are within normal limits. Kidneys demonstrate no renal  calculi or obstructive changes. The bladder is decompressed. Stomach/Bowel: Wall thickening is noted within the rectum consistent with the given clinical history of rectal mass. Scattered diverticular changes noted without evidence of diverticulitis. The appendix is within normal limits. Small bowel and stomach are unremarkable. Vascular/Lymphatic: Abdominal aorta is within normal limits. 13 mm node is noted adjacent to the distal aspect of the external iliac vein on the left. Similar node is noted on the right but slightly smaller in size measuring 10 mm. A few scattered small nodes are noted adjacent to the rectum. The largest of these measures 7 mm in short axis. Few scattered small  inguinal lymph nodes are noted but not sizable. Reproductive: Prostate is unremarkable. Other: No abdominal wall hernia or abnormality. No abdominopelvic ascites. Musculoskeletal: No acute or significant osseous findings. IMPRESSION: Changes consistent with the known history of rectal mass. Some associated small lymph nodes are noted in the perirectal fat as well as small external iliac lymph nodes as described. PET-CT may be helpful for further evaluation as clinically indicated. Moderate right-sided pleural effusion. Mild right lower lobe atelectatic change/early infiltrate. No definitive hepatic metastatic disease is seen. No other focal abnormality is noted. Electronically Signed   By: Inez Catalina M.D.   On: 01/11/2020 19:55   DG Chest 2 View  Result Date: 12/23/2019 CLINICAL DATA:  Asthma, hypertension EXAM: CHEST - 2 VIEW COMPARISON:  05/19/2018 FINDINGS: The lungs are symmetrically expanded. Mild bibasilar atelectasis. Superimposed perihilar interstitial pulmonary edema, possibly cardiogenic in nature. No pneumothorax. Small bilateral pleural effusions. Cardiac size within normal limits. No acute bone abnormality. IMPRESSION: Mild interstitial pulmonary edema and small bilateral pleural effusions, possibly cardiogenic in  nature. Electronically Signed   By: Fidela Salisbury MD   On: 12/23/2019 05:29   CT CHEST WO CONTRAST  Result Date: 01/11/2020 CLINICAL DATA:  Known rectal mass, evaluate for possible metastatic disease EXAM: CT CHEST, ABDOMEN AND PELVIS WITHOUT CONTRAST TECHNIQUE: Multidetector CT imaging of the chest, abdomen and pelvis was performed following the standard protocol without IV contrast. Examination is somewhat limited due to lack of IV contrast. COMPARISON:  None. FINDINGS: CT CHEST FINDINGS Cardiovascular: Somewhat limited due to lack of IV contrast. No atherosclerotic calcifications are seen. No aneurysmal dilatation of the aorta is noted. Heart is not significantly enlarged. Pulmonary outflow tract appears within normal limits. Scattered coronary calcifications are noted. Mediastinum/Nodes: Thoracic inlet is within normal limits. No sizable hilar or mediastinal adenopathy is noted. The esophagus as visualized is within normal limits. Lungs/Pleura: The lungs are well aerated bilaterally. A moderate right-sided pleural effusion is noted. Mild basilar atelectasis/infiltrate is noted in the right lower lobe. No definitive parenchymal nodules are seen. No gross metastatic disease is seen. Musculoskeletal: Bony structures of the chest show degenerative change of the thoracic spine. No other significant abnormality is noted CT ABDOMEN PELVIS FINDINGS Hepatobiliary: Gallbladder is decompressed. Liver is well visualized although evaluation is somewhat limited due to lack of IV contrast. No definitive mass lesion is seen. Pancreas: Unremarkable. No pancreatic ductal dilatation or surrounding inflammatory changes. Spleen: Normal in size without focal abnormality. Adrenals/Urinary Tract: Adrenal glands are within normal limits. Kidneys demonstrate no renal calculi or obstructive changes. The bladder is decompressed. Stomach/Bowel: Wall thickening is noted within the rectum consistent with the given clinical history of  rectal mass. Scattered diverticular changes noted without evidence of diverticulitis. The appendix is within normal limits. Small bowel and stomach are unremarkable. Vascular/Lymphatic: Abdominal aorta is within normal limits. 13 mm node is noted adjacent to the distal aspect of the external iliac vein on the left. Similar node is noted on the right but slightly smaller in size measuring 10 mm. A few scattered small nodes are noted adjacent to the rectum. The largest of these measures 7 mm in short axis. Few scattered small inguinal lymph nodes are noted but not sizable. Reproductive: Prostate is unremarkable. Other: No abdominal wall hernia or abnormality. No abdominopelvic ascites. Musculoskeletal: No acute or significant osseous findings. IMPRESSION: Changes consistent with the known history of rectal mass. Some associated small lymph nodes are noted in the perirectal fat as well as small external iliac lymph  nodes as described. PET-CT may be helpful for further evaluation as clinically indicated. Moderate right-sided pleural effusion. Mild right lower lobe atelectatic change/early infiltrate. No definitive hepatic metastatic disease is seen. No other focal abnormality is noted. Electronically Signed   By: Inez Catalina M.D.   On: 01/11/2020 19:55   US RENAL  Result Date: 01/08/2020 CLINICAL DATA:  Acute renal failure EXAM: RENAL / URINARY TRACT ULTRASOUND COMPLETE COMPARISON:  None. FINDINGS: Right Kidney: Renal measurements: 11.2 x 5.0 x 4.7 cm = volume: 139.7 mL. Normal renal cortical thickness and echogenicity. No hydronephrosis. There is a 1.6 cm cyst midpole right kidney. Left Kidney: Renal measurements: 11.4 x 5.1 x 5.6 cm = volume: 170.6 mL. Normal renal cortical thickness and echogenicity. No hydronephrosis. There is a partially exophytic 2.3 cm cyst. Bladder: Appears normal for degree of bladder distention. Other: Small volume right upper quadrant ascites. IMPRESSION: No hydronephrosis.  Electronically Signed   By: Lovey Newcomer M.D.   On: 01/08/2020 15:36   DG Chest Port 1 View  Result Date: 01/06/2020 CLINICAL DATA:  54 year old male with shortness of breath and bilateral lower extremity swelling. EXAM: PORTABLE CHEST 1 VIEW COMPARISON:  Chest radiograph dated 12/25/2019. FINDINGS: There is mild cardiomegaly with mild central vascular congestion. No edema. No focal consolidation, pleural effusion, or pneumothorax. No acute osseous pathology. IMPRESSION: 1. No focal consolidation or edema. 2. Mild cardiomegaly with probable mild central vascular congestion. Electronically Signed   By: Anner Crete M.D.   On: 01/06/2020 16:59   DG Chest Port 1 View  Result Date: 12/25/2019 CLINICAL DATA:  Shortness of breath EXAM: PORTABLE CHEST 1 VIEW COMPARISON:  12/23/2019 and prior radiographs FINDINGS: Cardiomediastinal silhouette is unchanged. Mild bibasilar opacities are again identified. No pleural effusion or pneumothorax noted. No acute bony abnormalities are present. IMPRESSION: Unchanged mild bibasilar opacities/atelectasis. Electronically Signed   By: Margarette Canada M.D.   On: 12/25/2019 07:54   ECHOCARDIOGRAM COMPLETE  Result Date: 12/25/2019    ECHOCARDIOGRAM REPORT   Patient Name:   BLANDON OFFERDAHL Date of Exam: 12/25/2019 Medical Rec #:  [id number]    Height:       73.0 in Accession #:    [id number]   Weight:       235.1 lb Date of Birth:  09-25-1965    BSA:          2.305 m Patient Age:    30 years     BP:           169/107 mmHg Patient Gender: M            HR:           74 bpm. Exam Location:  Inpatient Procedure: 2D Echo Indications:    congestive heart failure 428.0  History:        Patient has no prior history of Echocardiogram examinations.                 Chronic kidney disease; Risk Factors:Hypertension, Dyslipidemia,                 Diabetes and Current Smoker.  Sonographer:    Johny Chess Referring Phys: [id number] [place]  1. Consider infiltrative cardiomyopathy  such as amyloidosis or global variant hypertrophic cardiomyopathy - LV wall thickness 1.8-1.9 cm. Left ventricular ejection fraction, by estimation, is 60 to 65%. The left ventricle has normal function. The left ventricle has no regional wall motion abnormalities. There is severe concentric left ventricular hypertrophy. Left ventricular  diastolic parameters are consistent with Grade II diastolic dysfunction (pseudonormalization). Elevated left ventricular end-diastolic pressure.  2. Right ventricular systolic function is normal. The right ventricular size is normal. Tricuspid regurgitation signal is inadequate for assessing PA pressure.  3. Left atrial size was mildly dilated.  4. The mitral valve is abnormal. Mild mitral valve regurgitation.  5. The aortic valve is tricuspid. Aortic valve regurgitation is not visualized. Mild aortic valve sclerosis is present, with no evidence of aortic valve stenosis.  6. The inferior vena cava is normal in size with greater than 50% respiratory variability, suggesting right atrial pressure of 3 mmHg. Comparison(s): No prior Echocardiogram. FINDINGS  Left Ventricle: Consider infiltrative cardiomyopathy such as amyloidosis or global variant hypertrophic cardiomyopathy - LV wall thickness 1.8-1.9 cm. Left ventricular ejection fraction, by estimation, is 60 to 65%. The left ventricle has normal function. The left ventricle has no regional wall motion abnormalities. The left ventricular internal cavity size was small. There is severe concentric left ventricular hypertrophy. Left ventricular diastolic parameters are consistent with Grade II diastolic dysfunction (pseudonormalization). Elevated left ventricular end-diastolic pressure. Right Ventricle: The right ventricular size is normal. No increase in right ventricular wall thickness. Right ventricular systolic function is normal. Tricuspid regurgitation signal is inadequate for assessing PA pressure. Left Atrium: Left atrial size was  mildly dilated. Right Atrium: Right atrial size was normal in size. Pericardium: There is no evidence of pericardial effusion. Mitral Valve: The mitral valve is abnormal. There is moderate thickening of the mitral valve leaflet(s). Mild mitral valve regurgitation. Tricuspid Valve: The tricuspid valve is grossly normal. Tricuspid valve regurgitation is trivial. Aortic Valve: The aortic valve is tricuspid. Aortic valve regurgitation is not visualized. Mild aortic valve sclerosis is present, with no evidence of aortic valve stenosis. Pulmonic Valve: The pulmonic valve was grossly normal. Pulmonic valve regurgitation is not visualized. Aorta: The aortic root and ascending aorta are structurally normal, with no evidence of dilitation. Venous: The inferior vena cava is normal in size with greater than 50% respiratory variability, suggesting right atrial pressure of 3 mmHg. IAS/Shunts: No atrial level shunt detected by color flow Doppler.  LEFT VENTRICLE PLAX 2D LVIDd:         5.00 cm      Diastology LVIDs:         3.30 cm      LV e' medial:    5.33 cm/s LV PW:         1.90 cm      LV E/e' medial:  19.3 LV IVS:        1.80 cm      LV e' lateral:   5.44 cm/s LVOT diam:     2.20 cm      LV E/e' lateral: 18.9 LV SV:         82 LV SV Index:   36 LVOT Area:     3.80 cm  LV Volumes (MOD) LV vol d, MOD A2C: 121.0 ml LV vol d, MOD A4C: 116.0 ml LV vol s, MOD A2C: 65.9 ml LV vol s, MOD A4C: 61.2 ml LV SV MOD A2C:     55.1 ml LV SV MOD A4C:     116.0 ml LV SV MOD BP:      56.1 ml RIGHT VENTRICLE             IVC RV S prime:     12.30 cm/s  IVC diam: 1.70 cm TAPSE (M-mode): 1.7 cm LEFT ATRIUM  Index       RIGHT ATRIUM           Index LA diam:        4.50 cm 1.95 cm/m  RA Area:     15.90 cm LA Vol (A2C):   83.1 ml 36.06 ml/m RA Volume:   42.90 ml  18.61 ml/m LA Vol (A4C):   80.9 ml 35.10 ml/m LA Biplane Vol: 82.1 ml 35.62 ml/m  AORTIC VALVE LVOT Vmax:   114.00 cm/s LVOT Vmean:  80.400 cm/s LVOT VTI:    0.217 m  AORTA  Ao Root diam: 3.10 cm Ao Asc diam:  3.40 cm MITRAL VALVE MV Area (PHT): 3.91 cm     SHUNTS MV Decel Time: 194 msec     Systemic VTI:  0.22 m MV E velocity: 103.00 cm/s  Systemic Diam: 2.20 cm MV A velocity: 72.00 cm/s MV E/A ratio:  1.43 Lyman Bishop MD Electronically signed by Lyman Bishop MD Signature Date/Time: 12/25/2019/12:12:14 PM    Final    VAS US RENAL ARTERY DUPLEX  Result Date: 12/26/2019 ABDOMINAL VISCERAL Indications: Hypertension High Risk Factors: Hypertension. Limitations: Air/bowel gas and obesity. Comparison Study: No prior studies. Performing Technologist: Darlin Coco, RDMS  Examination Guidelines: A complete evaluation includes B-mode imaging, spectral Doppler, color Doppler, and power Doppler as needed of all accessible portions of each vessel. Bilateral testing is considered an integral part of a complete examination. Limited examinations for reoccurring indications may be performed as noted.  Duplex Findings: +--------------------+--------+--------+------+--------+ Mesenteric          PSV cm/sEDV cm/sPlaqueComments +--------------------+--------+--------+------+--------+ Aorta at SMA          122                          +--------------------+--------+--------+------+--------+ Aorta Mid             118                          +--------------------+--------+--------+------+--------+ Celiac Artery Origin  103                          +--------------------+--------+--------+------+--------+ SMA Proximal          121      30                  +--------------------+--------+--------+------+--------+ SMA Mid               151      35                  +--------------------+--------+--------+------+--------+    +------------------+--------+--------+-------+ Right Renal ArteryPSV cm/sEDV cm/sComment +------------------+--------+--------+-------+ Origin               57      10           +------------------+--------+--------+-------+ Proximal              66      15           +------------------+--------+--------+-------+ Mid                  80      28           +------------------+--------+--------+-------+ Distal               71      24           +------------------+--------+--------+-------+ +-----------------+--------+--------+-------+  Left Renal ArteryPSV cm/sEDV cm/sComment +-----------------+--------+--------+-------+ Origin              66      18           +-----------------+--------+--------+-------+ Proximal            58      18           +-----------------+--------+--------+-------+ Mid                 46      15           +-----------------+--------+--------+-------+ Distal              73      20           +-----------------+--------+--------+-------+ +------------+--------+--------+----+-----------+--------+--------+----+ Right KidneyPSV cm/sEDV cm/sRI  Left KidneyPSV cm/sEDV cm/sRI   +------------+--------+--------+----+-----------+--------+--------+----+ Upper Pole  20      9       0.53Upper Pole 21      10      0.53 +------------+--------+--------+----+-----------+--------+--------+----+ Mid         24      11      0.55Mid        32      13      0.60 +------------+--------+--------+----+-----------+--------+--------+----+ Lower Pole  20      9       0.53Lower Pole 21      8       0.61 +------------+--------+--------+----+-----------+--------+--------+----+ Hilar       106     31      0.71Hilar      42      16      0.60 +------------+--------+--------+----+-----------+--------+--------+----+ +------------------+-----+------------------+-----+ Right Kidney           Left Kidney             +------------------+-----+------------------+-----+ RAR                    RAR                     +------------------+-----+------------------+-----+ RAR (manual)      0.66 RAR (manual)      0.54  +------------------+-----+------------------+-----+ Cortex             0.52 Cortex            0.52  +------------------+-----+------------------+-----+ Cortex thickness       Corex thickness         +------------------+-----+------------------+-----+ Kidney length (cm)10.48Kidney length (cm)11.05 +------------------+-----+------------------+-----+  Summary: Renal:  Right: Normal size right kidney. Normal right Resisitive Index. No        evidence of right renal artery stenosis. RRV flow present.        Cyst(s) noted. Left:  Normal size of left kidney. Normal left Resistive Index. No        evidence of left renal artery stenosis. LRV flow present.        Cyst(s) noted.  *See table(s) above for measurements and observations.  Diagnosing physician: Deitra Mayo MD  Electronically signed by Deitra Mayo MD on 12/26/2019 at 2:02:22 PM.    Final        IMPRESSION/PLAN: 1. 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 Dr. Lisbeth Renshaw discusses the pathology findings and reviews the nature of locally advanced rectal cancer. He reviews the rationale for MRI of the pelvis for more definitive clincial staging, and if he does have nodal disease, multimodality treatment. His case  will be discussed in multidisciplinary GI oncology conference next week. We will defer to Dr. Benay Spice but the patient may be a candidate for total neoadjuvant chemotherapy, followed by surgical resection.  We discussed the risks, benefits, short, and long term effects of radiotherapy, and the patient is interested in proceeding at the appropriate interval. Dr. Lisbeth Renshaw discusses the delivery and logistics of radiotherapy and anticipates a course of 5 1/2 weeks of radiotherapy. Depending on decision making after his MRI with Dr. Benay Spice, we will anticipate seeing him back in a few months to consider chemoRT. 2. Financial constraints and lack of health insurance. We've contacted our social work team to try to help coordinate filing for medicaid/medicare for disability given  his new diagnosis which he will be treated for and followed for at least 5 years.    Given current concerns for patient exposure during the COVID-19 pandemic, this encounter was conducted via telephone.  The patient has provided two factor identification and has given verbal consent for this type of encounter and has been advised to only accept a meeting of this type in a secure network environment. The time spent during this encounter was 60 minutes including preparation, discussion, and coordination of the patient's care. The attendants for this meeting include Blenda Nicely, RN, Dr. Lisbeth Renshaw, Hayden Pedro  and [name].  During the encounter,  Blenda Nicely, RN, Dr. Lisbeth Renshaw, and Hayden Pedro were located at [facility] Radiation Oncology Department.  Costas Sena was located at home.   The above documentation reflects my direct findings during this shared patient visit. Please see the separate note by Dr. Lisbeth Renshaw on this date for the remainder of the patient's plan of care.    Carola Rhine, PAC

## 2020-01-20 NOTE — Telephone Encounter (Signed)
[place] CSW Progress Notes  Call to patient to connect re needs/resources.  No answer, no VM.  Scheduled appt to see him when he is at [facility].  Edwyna Shell, LCSW Clinical Social Worker Phone:  [phone]

## 2020-01-21 NOTE — Progress Notes (Signed)
Spoke with patient just to confirm he is aware of his upcoming appointment with Dr. Benay Spice at [facility] on Thursday 10/28 at 2:45 to arrive no later than 2:30 for check in.  He is aware of our location.  He verbalized an understanding of his appointment.

## 2020-01-22 NOTE — Progress Notes (Signed)
This encounter was created in error - please disregard.

## 2020-01-26 ENCOUNTER — Encounter: Payer: Self-pay | Admitting: Radiation Oncology

## 2020-01-26 ENCOUNTER — Other Ambulatory Visit: Payer: Self-pay

## 2020-01-26 ENCOUNTER — Ambulatory Visit (HOSPITAL_COMMUNITY)
Admission: RE | Admit: 2020-01-26 | Discharge: 2020-01-26 | Disposition: A | Discharge status: Home / Self Care | Payer: Medicaid Other | Source: Ambulatory Visit | Attending: Radiation Oncology | Admitting: Radiation Oncology

## 2020-01-26 ENCOUNTER — Telehealth: Payer: Self-pay | Admitting: Internal Medicine

## 2020-01-26 ENCOUNTER — Other Ambulatory Visit: Payer: Self-pay | Admitting: Radiation Oncology

## 2020-01-26 DIAGNOSIS — C20 Malignant neoplasm of rectum: Secondary | ICD-10-CM

## 2020-01-26 NOTE — Progress Notes (Signed)
The patient's case was discussed today in multidisciplinary GI conference.  The patient did see Dr. Marcello Moores from surgery yesterday and will likely have limited options in terms of surgery.  Possible resection but would be at somewhat high risk.  Given comorbidities, the patient may be most appropriate for radiation treatment with concurrent infusional 5-FU.  This would likely help with local symptoms and would allow reassessment afterwards to see what the patient's responses and what may be most appropriate in terms of any possible consideration of surgery at that time.  The patient does see medical oncology tomorrow and tentatively we will plan to have the patient proceed with simulation after this upcoming visit.

## 2020-01-26 NOTE — Telephone Encounter (Signed)
Discussed questions regarding tumor location.  Agreement.

## 2020-01-27 ENCOUNTER — Other Ambulatory Visit: Payer: Self-pay

## 2020-01-27 ENCOUNTER — Inpatient Hospital Stay: Payer: Medicaid Other | Attending: Oncology | Admitting: Oncology

## 2020-01-27 ENCOUNTER — Inpatient Hospital Stay: Payer: Medicaid Other | Admitting: General Practice

## 2020-01-27 ENCOUNTER — Encounter: Payer: Self-pay | Admitting: General Practice

## 2020-01-27 VITALS — BP 161/107 | HR 90 | Temp 98.2°F | Resp 17 | Ht 73.0 in | Wt 256.1 lb

## 2020-01-27 DIAGNOSIS — C20 Malignant neoplasm of rectum: Secondary | ICD-10-CM | POA: Insufficient documentation

## 2020-01-27 DIAGNOSIS — K6289 Other specified diseases of anus and rectum: Secondary | ICD-10-CM

## 2020-01-27 DIAGNOSIS — Z23 Encounter for immunization: Secondary | ICD-10-CM | POA: Insufficient documentation

## 2020-01-27 MED ORDER — INFLUENZA VAC SPLIT QUAD 0.5 ML IM SUSY
0.5000 mL | PREFILLED_SYRINGE | Freq: Once | INTRAMUSCULAR | Status: AC
  Administered 2020-01-27: 0.5 mL via INTRAMUSCULAR

## 2020-01-27 MED ORDER — INFLUENZA VAC SPLIT QUAD 0.5 ML IM SUSY
PREFILLED_SYRINGE | INTRAMUSCULAR | Status: AC
  Filled 2020-01-27: qty 0.5

## 2020-01-27 NOTE — Progress Notes (Signed)
[place] New Patient Consult   Requesting MD: Charlott Rakes, [address] [place],  [place] [address]   Craig West 54 y.o.  1965/07/13    Reason for Consult: Rectal cancer    HPI: Craig West has a history of congestive heart failure.  He was admitted with volume overload on 01/06/2020.  He was noted to have anemia and rectal bleeding.  Dr. Henrene Pastor was consulted and he was taken to a colonoscopy on 01/11/2020.  An ulcerated mass was found in the rectum at 5-6 cm from the anal verge.  Biopsies were obtained.  The area was tattooed.  Polyps were removed from the sigmoid, descending, and ascending colon. The pathology revealed tubular adenomas on the polypectomy specimens.  The rectum biopsy returned as adenocarcinoma.  CTs of the chest, abdomen, and pelvis on 01/11/2020 revealed a moderate right pleural effusion with mild atelectasis/infiltrate in the right lower lobe.  Wall thickening is noted in the rectum.  A 13 mm node adjacent to the external iliac vein on the left with a similar 10 mm node on the right side.  Scattered small nodes adjacent to the rectum.  The largest perirectal node measured 7 mm.  No lung or liver metastases noted.  He was seen in consultation by Dr. Marcello Moores.  He also saw Dr. Lisbeth Renshaw.  His case was presented at the GI tumor conference earlier this week.  An MRI of the pelvis on 01/26/2020 confirmed a tumor at 7.1 cm from the internal anal sphincter extending through the muscularis propria into the perirectal fat.  Multiple lymph nodes are noted in the mesorectal space.  15 mm left external iliac node and slightly smaller right external iliac node.  Tumor was staged as a T3b,N2b by MRI.   Past Medical History:  Diagnosis Date  . Asthma   . CHF (congestive heart failure) ([place])   . Diabetes mellitus without complication ([place])   . Gout   . Hypertension     .  Peripheral neuropathy   .  Renal failure  Past Surgical History:  Procedure  Laterality Date  . BIOPSY  01/11/2020   Procedure: BIOPSY;  Surgeon: Irene Shipper, MD;  Location: [facility] ENDOSCOPY;  Service: Endoscopy;;  . COLONOSCOPY WITH PROPOFOL N/A 01/11/2020   Procedure: COLONOSCOPY WITH PROPOFOL;  Surgeon: Irene Shipper, MD;  Location: [facility];  Service: Endoscopy;  Laterality: N/A;  . NO PAST SURGERIES    . POLYPECTOMY  01/11/2020   Procedure: POLYPECTOMY;  Surgeon: Irene Shipper, MD;  Location: [facility] ENDOSCOPY;  Service: Endoscopy;;  . SUBMUCOSAL TATTOO INJECTION  01/11/2020   Procedure: SUBMUCOSAL TATTOO INJECTION;  Surgeon: Irene Shipper, MD;  Location: [facility] ENDOSCOPY;  Service: Endoscopy;;    Medications: Reviewed  Allergies: No Known Allergies  Family history: His brother had prostate cancer.  No other family history of cancer.  Social History:   He lives with friends in [place].  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  He quit smoking cigarettes 3 months ago.  He reports social alcohol use.  No transfusion history.  No risk factor for HIV or hepatitis.  ROS:   Positives include: Exertional dyspnea, rectal bleeding, rectal urgency, burning in the feet  A complete ROS was otherwise negative.  Physical Exam:  Blood pressure (!) 161/107, pulse 90, temperature 98.2 F (36.8 C), temperature source Tympanic, resp. rate 17, height 6\' 1"  (1.854 m), weight 256 lb 1.6 oz (116.2 kg), SpO2 98 %.  HEENT: Neck without mass Lungs: Clear bilaterally Cardiac: Regular rate and rhythm, S3 gallop? Abdomen: No hepatosplenomegaly, no mass, nontender GU: Testes without mass Vascular: Trace lower leg edema bilaterally Lymph nodes: No cervical, supraclavicular, axillary, or inguinal nodes Neurologic: Alert and oriented, the motor exam appears intact in the upper and lower extremities bilaterally Skin: Tattoos, no rash Musculoskeletal: No spine tenderness   LAB:  CBC  Lab Results  Component Value Date   WBC 13.3 (H) 01/11/2020    HGB 11.1 (L) 01/11/2020   HCT 35.8 (L) 01/11/2020   MCV 90.9 01/11/2020   PLT 233 01/11/2020   NEUTROABS 10.3 (H) 01/11/2020        CMP  Lab Results  Component Value Date   NA 143 01/11/2020   K 4.1 01/11/2020   CL 108 01/11/2020   CO2 22 01/11/2020   GLUCOSE 103 (H) 01/11/2020   BUN 67 (H) 01/11/2020   CREATININE 3.45 (H) 01/11/2020   CALCIUM 8.6 (L) 01/11/2020   PROT 7.1 01/03/2020   ALBUMIN 3.0 (L) 01/09/2020   AST 32 01/03/2020   ALT 52 (H) 01/03/2020   ALKPHOS 207 (H) 01/03/2020   BILITOT 0.3 01/03/2020   GFRNONAA 19 (L) 01/11/2020   GFRAA 15 (L) 01/03/2020     Lab Results  Component Value Date   CEA1 2.8 01/11/2020    Imaging:  MR PELVIS WO CONTRAST  Result Date: 01/27/2020 CLINICAL DATA:  Rectal cancer EXAM: MRI PELVIS WITHOUT CONTRAST TECHNIQUE: Multiplanar multisequence MR imaging of the pelvis was performed. No intravenous contrast was administered. Small amount of [place] gel was administered per rectum to optimize tumor evaluation. COMPARISON:  CT chest, abdomen and pelvis of January 11, 2020 FINDINGS: TUMOR LOCATION Tumor distance from Anal Verge/Skin Surface:  12.5 cm Tumor distance to Internal Anal Sphincter: 7.1 cm TUMOR DESCRIPTION Circumferential Extent: Circumferential favoring the posterior rectal wall. Tumor Length: 5-6 cm T - CATEGORY Extension through Muscularis Propria: T3 B, (image 27, series 11) 3-4 mm extension beyond the muscularis into the perirectal fat. Other areas along the RIGHT lateral and posterolateral rectum with indistinct boundaries. Shortest Distance of any tumor/node from Mesorectal Fascia: Multiple lymph nodes along the posterior margin of the mesorectal space in the high mesorectum and in the mid mesorectum. For example on image 22 series 11 a 9 mm lymph node abuts the mesorectal fascia. Similarly on image 16 of series 11 3 lymph nodes are within 2 mm of the mesorectal fascia 1 on image 15 to the RIGHT within 1 mm of the mesorectum.  Distance of nodes from the mesorectum, is 0-2 mm for many of the lymph nodes in the mesorectum. Extramural Vascular Invasion/Tumor Thrombus: No Invasion of Anterior Peritoneal Reflection: No Involvement of Adjacent Organs or Pelvic Sidewall: No Levator Ani Involvement: No N - CATEGORY Mesorectal Lymph Nodes >=62mm: Into disease. Approximately 9 lymph nodes in the mesorectal and superior rectal distribution, largest 12 mm on image 6 of series 11, another 11 mm lymph node in the superior rectal distribution. Indistinct LEFT pelvic sidewall lymph node 8 mm (image 10, series 11) this is an external iliac lymph node and is also mildly heterogeneous with respect to signal but is nonspecific 15 mm LEFT external iliac lymph node and slightly smaller RIGHT external iliac lymph node on image 23 of series 2 Extra-mesorectal Lymphadenopathy: Enlarged lymph nodes beyond the mesorectum and superior rectal distribution, nonspecific appearance but with heterogeneity and loss of fatty hilum particularly with respect to LEFT external iliac lymph node that  may warrant further evaluation. Other: Urinary bladder under distended, grossly unremarkable. Colonic diverticulosis. No visible bone lesion on limited assessment. IMPRESSION: Rectal adenocarcinoma T stage: T3b Rectal adenocarcinoma N stage: N2 b Distance from tumor to the internal anal sphincter is 7.1 cm. Numerous regional lymph nodes and findings of external iliac lymph nodes that are nonspecific. These could be potentially reactive given lymph nodes in the groin bilaterally though given the number of lymph nodes in the mesorectum PET scan may be helpful for further staging in this patient as clinically warranted. Electronically Signed   By: Zetta Bills M.D.   On: 01/27/2020 14:22    CT images from 01/11/2020-reviewed  Assessment/Plan:   1. Rectal cancer-rectal mass at 5-6 cm from the anal verge on colonoscopy 01/11/2020, biopsy confirmed adenocarcinoma  CTs  01/11/2020-rectal thickening, small perirectal lymph nodes and external iliac nodes, moderate right pleural effusion  MRI pelvis 01/27/2020-T3bN2b tumor at 7.1 cm from the internal anal sphincter, multiple mesorectal nodes, indistinct left pelvic sidewall node and enlarged bilateral external iliac nodes 2. Anemia 3. Chronic renal failure 4. Congestive heart failure-severe LVH 5. Diabetes 6. Gout 7. Peripheral neuropathy?   Disposition:   Craig West has been diagnosed with locally advanced rectal cancer.  He appears to have clinical stage III disease, but he may have more advanced disease involving iliac lymph nodes.  His case was presented at the GI tumor conference earlier this week.  Dr. Marcello Moores feels he is not a candidate for a temporary diverting ileostomy.  He may be a candidate for an APR.  Craig West does not wish to have a colostomy.  He understands surgery is the only treatment modality with a significant chance of cure.  He saw Dr. Lisbeth Renshaw and appears to be a candidate for "neoadjuvant "chemotherapy and radiation.  His renal failure limits treatment options.  I recommend infusional 5-fluorouracil and concurrent radiation.  We reviewed potential toxicities associated with 5-fluorouracil including the chance for a rash, hyperpigmentation, mucositis, diarrhea, hematologic toxicity, sun sensitivity, and hand/foot syndrome.  He will attend a chemotherapy teaching class.  I think it would be difficult for him to tolerate FOLFOX with the multiple comorbid conditions including advanced renal failure and a report of peripheral neuropathy.  He will be referred for PICC placement prior to beginning 5-fluorouracil and radiation on 02/07/2020.  I will review the indication for obtaining a staging PET scan with Dr. Lisbeth Renshaw.   Betsy Coder, MD  01/27/2020, 3:51 PM

## 2020-01-27 NOTE — Progress Notes (Signed)
[facility] Psychosocial Distress Screening Clinical Social Work  Clinical Social Work was referred by distress screening protocol.  The patient scored a 8 on the Psychosocial Distress Thermometer which indicates moderate distress. Clinical Social Worker met w patient in lobby] to assess for distress and other psychosocial needs.   Referrals made to [facility] and [facility], referred to [employer].  Will continue to follow him as needed for support/resources.    ONCBCN DISTRESS SCREENING 01/27/2020  Screening Type Initial Screening  Distress experienced in past week (1-10) 8  Practical problem type Work/school;Insurance  Emotional problem type Adjusting to illness  Information Concerns Type Lack of info about diagnosis;Lack of info about treatment;Lack of info about complementary therapy choices  Physician notified of physical symptoms Yes  Referral to clinical psychology No  Referral to clinical social work No  Referral to dietition No  Referral to financial advocate No  Referral to support programs No  Referral to palliative care No  Other     Clinical Social Worker follow up needed: Yes.    If yes, follow up plan:  Phone check in in 2 weeks.   Beverely Pace, [place], LCSW Clinical Social Worker Phone:  [phone]

## 2020-01-27 NOTE — Progress Notes (Signed)
[place] CSW Progress Notes  Met w patient in lobby prior to visit w oncologist.  Primary concerns are insurance and income.  He has applied for Medicaid, appears that [employer] screened him in Sept 2021 and did not find him to have disability.  He has since gone to DSS and applied for/been granted [employer].  Per patient, DSS may have taken Medicaid application but he is not sure.  He states he has not applied for Social Security disability and would like to.  Referral will be sent to [facility] w understanding that patient may have application in process that he is unaware of.  CSW will also ask [employer] to reopen case for Medicaid consideration.  Lives w several friends in niece's house.  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  Support locally are his friends/roommates/niece. Mother lives in [place] and they have not seen each other since prior to [place].  He is quite anxious to understand more about his cancer and the treatment plan.    Provided first disbursement for [employer] - he is currently driving himself to appointments and is out of gas.  Was also given information on [place] if he is unable to drive himself.  Encouraged him to answer his phone regularly as multiple agencies will be trying to reach him, including [place], [place] Worker Phone:  [phone]

## 2020-01-27 NOTE — Progress Notes (Signed)
START OFF PATHWAY REGIMEN - Colorectal   Custom Intervention:Medical: [Fluorouracil]:     Fluorouracil   **Always confirm dose/schedule in your pharmacy ordering system**  Patient Characteristics: Preoperative or Nonsurgical Candidate (Clinical Staging), Rectal, cT3 - cT4, cN0 or Any cT, cN+ Tumor Location: Rectal Therapeutic Status: Preoperative or Nonsurgical Candidate (Clinical Staging) AJCC T Category: Staged < 8th Ed. AJCC N Category: Staged < 8th Ed. AJCC M Category: Staged < 8th Ed. AJCC 8 Stage Grouping: Staged < 8th Ed. Intent of Therapy: Curative Intent, Not Discussed with Patient

## 2020-01-27 NOTE — Progress Notes (Signed)
Met with patient at his initial medical oncology consult with Dr. [name].  Patient presents alone.  He states his family is in [place]).  Lives with roommates.  I explained my role as nurse navigator and he was given my card with my direct contact information.  He will come for CT simulation on Monday 11/1 at 9:00 am, have labs and attend chemo education class.  He verbalized an understanding of the plan of care Dr. Benay Spice presented.

## 2020-01-28 ENCOUNTER — Encounter: Payer: Self-pay | Admitting: *Deleted

## 2020-01-28 ENCOUNTER — Other Ambulatory Visit: Payer: Self-pay | Admitting: Radiation Oncology

## 2020-01-28 DIAGNOSIS — C20 Malignant neoplasm of rectum: Secondary | ICD-10-CM

## 2020-01-28 NOTE — Progress Notes (Signed)
IR not able to place PICC line on 11/8 before MD appointment. Asking if it can be placed on Friday, 11/5? Gave OK

## 2020-01-31 ENCOUNTER — Other Ambulatory Visit: Payer: Self-pay | Admitting: Radiation Oncology

## 2020-01-31 ENCOUNTER — Other Ambulatory Visit: Payer: Self-pay

## 2020-01-31 ENCOUNTER — Inpatient Hospital Stay: Payer: Medicaid Other

## 2020-01-31 ENCOUNTER — Inpatient Hospital Stay: Payer: Medicaid Other | Attending: Oncology

## 2020-01-31 ENCOUNTER — Ambulatory Visit: Payer: Medicaid Other | Admitting: Radiation Oncology

## 2020-01-31 ENCOUNTER — Telehealth: Payer: Self-pay | Admitting: *Deleted

## 2020-01-31 DIAGNOSIS — E119 Type 2 diabetes mellitus without complications: Secondary | ICD-10-CM | POA: Insufficient documentation

## 2020-01-31 DIAGNOSIS — D649 Anemia, unspecified: Secondary | ICD-10-CM | POA: Insufficient documentation

## 2020-01-31 DIAGNOSIS — I509 Heart failure, unspecified: Secondary | ICD-10-CM | POA: Diagnosis not present

## 2020-01-31 DIAGNOSIS — C20 Malignant neoplasm of rectum: Secondary | ICD-10-CM | POA: Diagnosis present

## 2020-01-31 DIAGNOSIS — N189 Chronic kidney disease, unspecified: Secondary | ICD-10-CM | POA: Diagnosis not present

## 2020-01-31 DIAGNOSIS — Z5111 Encounter for antineoplastic chemotherapy: Secondary | ICD-10-CM | POA: Insufficient documentation

## 2020-01-31 LAB — CBC WITH DIFFERENTIAL (CANCER CENTER ONLY)
Abs Immature Granulocytes: 0.04 10*3/uL (ref 0.00–0.07)
Basophils Absolute: 0 10*3/uL (ref 0.0–0.1)
Basophils Relative: 1 %
Eosinophils Absolute: 0.3 10*3/uL (ref 0.0–0.5)
Eosinophils Relative: 3 %
HCT: 35.8 % — ABNORMAL LOW (ref 39.0–52.0)
Hemoglobin: 11.4 g/dL — ABNORMAL LOW (ref 13.0–17.0)
Immature Granulocytes: 1 %
Lymphocytes Relative: 16 %
Lymphs Abs: 1.4 10*3/uL (ref 0.7–4.0)
MCH: 28.8 pg (ref 26.0–34.0)
MCHC: 31.8 g/dL (ref 30.0–36.0)
MCV: 90.4 fL (ref 80.0–100.0)
Monocytes Absolute: 0.7 10*3/uL (ref 0.1–1.0)
Monocytes Relative: 8 %
Neutro Abs: 6.2 10*3/uL (ref 1.7–7.7)
Neutrophils Relative %: 71 %
Platelet Count: 216 10*3/uL (ref 150–400)
RBC: 3.96 MIL/uL — ABNORMAL LOW (ref 4.22–5.81)
RDW: 14.9 % (ref 11.5–15.5)
WBC Count: 8.6 10*3/uL (ref 4.0–10.5)
nRBC: 0 % (ref 0.0–0.2)

## 2020-01-31 LAB — CMP (CANCER CENTER ONLY)
ALT: 18 U/L (ref 0–44)
AST: 17 U/L (ref 15–41)
Albumin: 3.3 g/dL — ABNORMAL LOW (ref 3.5–5.0)
Alkaline Phosphatase: 157 U/L — ABNORMAL HIGH (ref 38–126)
Anion gap: 7 (ref 5–15)
BUN: 43 mg/dL — ABNORMAL HIGH (ref 6–20)
CO2: 25 mmol/L (ref 22–32)
Calcium: 8.9 mg/dL (ref 8.9–10.3)
Chloride: 107 mmol/L (ref 98–111)
Creatinine: 2.94 mg/dL — ABNORMAL HIGH (ref 0.61–1.24)
GFR, Estimated: 24 mL/min — ABNORMAL LOW (ref >60)
Glucose, Bld: 197 mg/dL — ABNORMAL HIGH (ref 70–99)
Potassium: 4.4 mmol/L (ref 3.5–5.1)
Sodium: 139 mmol/L (ref 135–145)
Total Bilirubin: 0.5 mg/dL (ref 0.3–1.2)
Total Protein: 7.7 g/dL (ref 6.5–8.1)

## 2020-02-01 NOTE — Progress Notes (Signed)
Pharmacist Chemotherapy Monitoring - Initial Assessment    Anticipated start date: 02/07/20  Regimen:  . Are orders appropriate based on the patient's diagnosis, regimen, and cycle? Yes . Does the plan date match the patient's scheduled date? Yes . Is the sequencing of drugs appropriate? Yes . Are the premedications appropriate for the patient's regimen? Yes . Prior Authorization for treatment is: Uninsured o If applicable, is the correct biosimilar selected based on the patient's insurance? not applicable  Organ Function and Labs: Marland Kitchen Are dose adjustments needed based on the patient's renal function, hepatic function, or hematologic function? Yes . Are appropriate labs ordered prior to the start of patient's treatment? Yes . Other organ system assessment, if indicated: N/A . The following baseline labs, if indicated, have been ordered: N/A  Dose Assessment: . Are the drug doses appropriate? Yes . Are the following correct: o Drug concentrations Yes o IV fluid compatible with drug Yes o Administration routes Yes o Timing of therapy Yes . If applicable, does the patient have documented access for treatment and/or plans for port-a-cath placement? yes . If applicable, have lifetime cumulative doses been properly documented and assessed? not applicable Lifetime Dose Tracking  No doses have been documented on this patient for the following tracked chemicals: Doxorubicin, Epirubicin, Idarubicin, Daunorubicin, Mitoxantrone, Bleomycin, Oxaliplatin, Carboplatin, Liposomal Doxorubicin  o   Toxicity Monitoring/Prevention: . The patient has the following take home antiemetics prescribed: N/A . The patient has the following take home medications prescribed: N/A . Medication allergies and previous infusion related reactions, if applicable, have been reviewed and addressed. Yes . The patient's current medication list has been assessed for drug-drug interactions with their chemotherapy regimen. no  significant drug-drug interactions were identified on review.  Order Review: . Are the treatment plan orders signed? No . Is the patient scheduled to see a provider prior to their treatment? Yes  I verify that I have reviewed each item in the above checklist and answered each question accordingly.  Philomena Course 02/01/2020 8:53 AM

## 2020-02-02 MED FILL — ISOSORBIDE MN ER 60 MG TAB: 60 | 30 days supply | Qty: 30 | Fill #0

## 2020-02-02 MED FILL — TAMSULOSIN HCL 0.4 MG CAP: 0.4 | 30 days supply | Qty: 30 | Fill #0

## 2020-02-02 MED FILL — ?CARVEDILOL 6.25 MG TABLET: 6.25 | 30 days supply | Qty: 120 | Fill #0

## 2020-02-02 MED FILL — hydrALAZINE HCL 50 MG TABS: 50 | 30 days supply | Qty: 60 | Fill #0

## 2020-02-02 MED FILL — ?GLIPIZIDE 10 MG TABLET: 10 | 30 days supply | Qty: 60 | Fill #0

## 2020-02-02 MED FILL — ?FUROSEMIDE 80MG TABLET: 80 | 30 days supply | Qty: 15 | Fill #0

## 2020-02-02 MED FILL — ALBUTEROL SULFATE HFA 108 (: 108 (90 BAS | 25 days supply | Qty: 18 | Fill #0

## 2020-02-02 MED FILL — AMLODIPINE BESYLATE 5 MG TA: 5 | 30 days supply | Qty: 30 | Fill #1

## 2020-02-02 MED FILL — GABAPENTIN 300 MG CAPSULE: 300 | 30 days supply | Qty: 60 | Fill #0

## 2020-02-02 MED FILL — ?ATORVASTATIN 40MG TABLET: 40 | 30 days supply | Qty: 30 | Fill #0

## 2020-02-02 MED FILL — ?ALLOPURINOL 100MG TABLET: 100 | 30 days supply | Qty: 60 | Fill #0

## 2020-02-03 ENCOUNTER — Ambulatory Visit: Payer: Medicaid Other | Admitting: Radiation Oncology

## 2020-02-03 ENCOUNTER — Inpatient Hospital Stay: Payer: Medicaid Other | Admitting: General Practice

## 2020-02-03 ENCOUNTER — Other Ambulatory Visit: Payer: Self-pay

## 2020-02-03 ENCOUNTER — Encounter: Payer: Self-pay | Admitting: Oncology

## 2020-02-03 ENCOUNTER — Telehealth: Payer: Self-pay | Admitting: Licensed Clinical Social Worker

## 2020-02-03 ENCOUNTER — Encounter: Payer: Self-pay | Admitting: General Practice

## 2020-02-03 ENCOUNTER — Inpatient Hospital Stay: Payer: Medicaid Other

## 2020-02-03 NOTE — Telephone Encounter (Signed)
CSW called pt yesterday to check in regarding medication costs concerns.  Pt had been sent home with medications from hospital but is running low and has 11 medications to be filled at [facility] which he can't afford- unsure how much they cost at this time.  CSW called [facility] and was told it would be $74 but if pt came in and signed [facility] application medications would be closer to $50.  CSW met pt at pharmacy after he had filled out [facility] application and was able to assist with medication copay costs through [facility] [place].  Pt currently working with [employer] and [employer] to get Medicaid and disability but CSW encouraged him to reach out if he struggles to pay copays in the future and we would help as able.  Will continue to follow and assist as needed  Jorge Ny, [place] [facility] Desk#: [phone] Cell#: [phone]

## 2020-02-03 NOTE — Progress Notes (Signed)
Met with patient by elevators to introduce myself as Arboriculturist and to offer available resources.  Discussed one-time $1000 Radio broadcast assistant to assist with personal expenses while going through treatment. Advised letter of support needed and his landlord may give me a call for me to provide details.  He has my card for any additional financial questions or concerns.

## 2020-02-03 NOTE — Progress Notes (Addendum)
[place] CSW Progress Notes  Met w patient in lobby.  He is very distressed about lack of income and insurance.  He has been called by [facility] re his disability application - he will return paperwork to them and proceed w disability application.  Per notes from [employer], [place], they have screened him out for a Medicaid application, therefore they are not currently assisting him with a Medicaid application, CSW will speak w supervisor.  Second [employer] given. Bag of food from [employer] provided.  Patient also advised to connect w Financial Advocate re the [employer].  Edwyna Shell, LCSW Clinical Social Worker Phone:  [phone]

## 2020-02-04 ENCOUNTER — Other Ambulatory Visit: Payer: Self-pay | Admitting: *Deleted

## 2020-02-04 ENCOUNTER — Other Ambulatory Visit (HOSPITAL_COMMUNITY): Payer: Self-pay

## 2020-02-04 ENCOUNTER — Encounter: Payer: Self-pay | Admitting: General Practice

## 2020-02-04 MED ORDER — PROCHLORPERAZINE MALEATE 10 MG PO TABS: 10.0000 mg | ORAL_TABLET | Freq: Four times a day (QID) | ORAL | 0 refills | Status: DC | PRN

## 2020-02-04 NOTE — Progress Notes (Signed)
Anti-emetic sent to pharmacy. Patient is aware.

## 2020-02-04 NOTE — Progress Notes (Signed)
[place] CSW Progress Notes  Asked Maeola Harman, inpatient Financial Advocate, to review case and determine if [employer] can help w patient's Medicaid application.  After review, he will be referred to [facility] and they will reach out to patient to assist w Medicaid application.  Edwyna Shell, LCSW Clinical Social Worker Phone:  [phone]

## 2020-02-07 ENCOUNTER — Ambulatory Visit: Payer: Self-pay

## 2020-02-07 ENCOUNTER — Ambulatory Visit: Payer: Self-pay | Admitting: Oncology

## 2020-02-07 ENCOUNTER — Ambulatory Visit: Payer: Medicaid Other | Admitting: Radiation Oncology

## 2020-02-08 ENCOUNTER — Ambulatory Visit: Payer: Medicaid Other

## 2020-02-09 ENCOUNTER — Ambulatory Visit: Payer: Medicaid Other

## 2020-02-10 ENCOUNTER — Ambulatory Visit: Payer: Medicaid Other

## 2020-02-11 ENCOUNTER — Ambulatory Visit: Payer: Medicaid Other

## 2020-02-11 ENCOUNTER — Ambulatory Visit
Admission: RE | Admit: 2020-02-11 | Discharge: 2020-02-11 | Disposition: A | Discharge status: Home / Self Care | Payer: Medicaid Other | Source: Ambulatory Visit | Attending: Radiation Oncology | Admitting: Radiation Oncology

## 2020-02-11 ENCOUNTER — Other Ambulatory Visit: Payer: Self-pay

## 2020-02-11 ENCOUNTER — Encounter (HOSPITAL_COMMUNITY)
Admission: RE | Admit: 2020-02-11 | Discharge: 2020-02-11 | Disposition: A | Discharge status: Home / Self Care | Payer: Medicaid Other | Source: Ambulatory Visit | Attending: Radiation Oncology | Admitting: Radiation Oncology

## 2020-02-11 DIAGNOSIS — C20 Malignant neoplasm of rectum: Secondary | ICD-10-CM | POA: Insufficient documentation

## 2020-02-11 DIAGNOSIS — Z510 Encounter for antineoplastic radiation therapy: Secondary | ICD-10-CM | POA: Diagnosis present

## 2020-02-11 LAB — GLUCOSE, CAPILLARY: Glucose-Capillary: 125 mg/dL — ABNORMAL HIGH (ref 70–99)

## 2020-02-11 MED ORDER — FLUDEOXYGLUCOSE F - 18 (FDG) INJECTION
12.7800 | Freq: Once | INTRAVENOUS | Status: AC | PRN
  Administered 2020-02-11: 12.78 via INTRAVENOUS

## 2020-02-14 ENCOUNTER — Ambulatory Visit: Payer: Medicaid Other

## 2020-02-14 ENCOUNTER — Ambulatory Visit: Payer: Medicaid Other | Admitting: Radiation Oncology

## 2020-02-15 ENCOUNTER — Ambulatory Visit: Payer: Medicaid Other

## 2020-02-15 ENCOUNTER — Other Ambulatory Visit: Payer: Self-pay | Admitting: *Deleted

## 2020-02-15 DIAGNOSIS — C20 Malignant neoplasm of rectum: Secondary | ICD-10-CM

## 2020-02-15 NOTE — Progress Notes (Signed)
Pharmacist Chemotherapy Monitoring - Initial Assessment    Anticipated start date: 02/21/20  Regimen:  . Are orders appropriate based on the patient's diagnosis, regimen, and cycle? Yes . Does the plan date match the patient's scheduled date? Yes . Is the sequencing of drugs appropriate? Yes . Are the premedications appropriate for the patient's regimen? Yes . Prior Authorization for treatment is: Uninsured o If applicable, is the correct biosimilar selected based on the patient's insurance? not applicable  Organ Function and Labs: Marland Kitchen Are dose adjustments needed based on the patient's renal function, hepatic function, or hematologic function? Yes . Are appropriate labs ordered prior to the start of patient's treatment? Yes . Other organ system assessment, if indicated: N/A . The following baseline labs, if indicated, have been ordered: N/A  Dose Assessment: . Are the drug doses appropriate? Yes . Are the following correct: o Drug concentrations Yes o IV fluid compatible with drug Yes o Administration routes Yes o Timing of therapy No . If applicable, does the patient have documented access for treatment and/or plans for port-a-cath placement? yes . If applicable, have lifetime cumulative doses been properly documented and assessed? not applicable Lifetime Dose Tracking  No doses have been documented on this patient for the following tracked chemicals: Doxorubicin, Epirubicin, Idarubicin, Daunorubicin, Mitoxantrone, Bleomycin, Oxaliplatin, Carboplatin, Liposomal Doxorubicin  o   Toxicity Monitoring/Prevention: . The patient has the following take home antiemetics prescribed: Prochlorperazine . The patient has the following take home medications prescribed: N/A . Medication allergies and previous infusion related reactions, if applicable, have been reviewed and addressed. Yes . The patient's current medication list has been assessed for drug-drug interactions with their chemotherapy  regimen. no significant drug-drug interactions were identified on review.  Order Review: . Are the treatment plan orders signed? Yes . Is the patient scheduled to see a provider prior to their treatment? No  I verify that I have reviewed each item in the above checklist and answered each question accordingly.  Philomena Course 02/15/2020 10:31 AM

## 2020-02-16 ENCOUNTER — Ambulatory Visit: Payer: Medicaid Other

## 2020-02-17 ENCOUNTER — Ambulatory Visit: Payer: Medicaid Other

## 2020-02-18 ENCOUNTER — Ambulatory Visit: Payer: Medicaid Other

## 2020-02-18 ENCOUNTER — Ambulatory Visit (HOSPITAL_COMMUNITY)
Admission: RE | Admit: 2020-02-18 | Discharge: 2020-02-18 | Disposition: A | Discharge status: Home / Self Care | Payer: Medicaid Other | Source: Ambulatory Visit | Attending: Oncology | Admitting: Oncology

## 2020-02-18 ENCOUNTER — Encounter: Payer: Self-pay | Admitting: General Practice

## 2020-02-18 ENCOUNTER — Other Ambulatory Visit: Payer: Self-pay | Admitting: Oncology

## 2020-02-18 ENCOUNTER — Other Ambulatory Visit: Payer: Self-pay

## 2020-02-18 DIAGNOSIS — Z510 Encounter for antineoplastic radiation therapy: Secondary | ICD-10-CM | POA: Diagnosis not present

## 2020-02-18 DIAGNOSIS — C20 Malignant neoplasm of rectum: Secondary | ICD-10-CM | POA: Diagnosis not present

## 2020-02-18 MED ORDER — LIDOCAINE HCL 1 % IJ SOLN
INTRAMUSCULAR | Status: AC
  Administered 2020-02-18: 2 mL
  Filled 2020-02-18: qty 20

## 2020-02-18 MED ORDER — HEPARIN SOD (PORK) LOCK FLUSH 100 UNIT/ML IV SOLN
INTRAVENOUS | Status: AC
  Filled 2020-02-18: qty 5

## 2020-02-18 NOTE — Progress Notes (Addendum)
Craig West provided proof of filing for Social Security disability on 02/16/2020.  Shanon Rosser, Financial Advocate, has asked [employer] to review account and assist w filing for Medicaid if eligible.    Edwyna Shell, LCSW Clinical Social Worker Phone:  [phone]

## 2020-02-18 NOTE — Procedures (Signed)
Successful placement of dual lumen PICC line to right basilic vein. Length 40cm Tip at lower SVC/RA PICC capped No complications Ready for use.  EBL < 5 mL   Docia Barrier PA-C 02/18/2020 3:59 PM

## 2020-02-20 ENCOUNTER — Other Ambulatory Visit: Payer: Self-pay | Admitting: Oncology

## 2020-02-21 ENCOUNTER — Ambulatory Visit: Payer: Medicaid Other

## 2020-02-21 ENCOUNTER — Inpatient Hospital Stay (HOSPITAL_BASED_OUTPATIENT_CLINIC_OR_DEPARTMENT_OTHER): Payer: Medicaid Other | Admitting: Oncology

## 2020-02-21 ENCOUNTER — Other Ambulatory Visit: Payer: Self-pay

## 2020-02-21 ENCOUNTER — Inpatient Hospital Stay: Payer: Medicaid Other

## 2020-02-21 ENCOUNTER — Ambulatory Visit
Admission: RE | Admit: 2020-02-21 | Discharge: 2020-02-21 | Disposition: A | Discharge status: Home / Self Care | Payer: Medicaid Other | Source: Ambulatory Visit | Attending: Radiation Oncology | Admitting: Radiation Oncology

## 2020-02-21 ENCOUNTER — Encounter: Payer: Self-pay | Admitting: Oncology

## 2020-02-21 VITALS — BP 131/92 | HR 78

## 2020-02-21 VITALS — BP 160/110 | Temp 97.9°F | Ht 73.0 in | Wt 247.3 lb

## 2020-02-21 DIAGNOSIS — C20 Malignant neoplasm of rectum: Secondary | ICD-10-CM

## 2020-02-21 DIAGNOSIS — Z5111 Encounter for antineoplastic chemotherapy: Secondary | ICD-10-CM | POA: Diagnosis not present

## 2020-02-21 DIAGNOSIS — Z510 Encounter for antineoplastic radiation therapy: Secondary | ICD-10-CM | POA: Diagnosis not present

## 2020-02-21 MED ORDER — SODIUM CHLORIDE 0.9 % IV SOLN
225.0000 mg/m2/d | INTRAVENOUS | Status: DC
  Administered 2020-02-21: 3850 mg via INTRAVENOUS
  Filled 2020-02-21: qty 77

## 2020-02-21 MED ORDER — HEPARIN SOD (PORK) LOCK FLUSH 100 UNIT/ML IV SOLN
250.0000 [IU] | Freq: Once | INTRAVENOUS | Status: AC | PRN
  Administered 2020-02-21: 250 [IU]
  Filled 2020-02-21: qty 5

## 2020-02-21 MED ORDER — SODIUM CHLORIDE 0.9% FLUSH
10.0000 mL | INTRAVENOUS | Status: DC | PRN
  Administered 2020-02-21: 10 mL
  Filled 2020-02-21: qty 10

## 2020-02-21 NOTE — Progress Notes (Signed)
[place] OFFICE PROGRESS NOTE   Diagnosis: Rectal cancer  INTERVAL HISTORY:   [name] returns as scheduled.  He underwent PICC placement on 02/18/2020.  He is scheduled to begin chemotherapy and radiation today.  No new complaint.  He is having bowel movements.  No rectal bleeding.  Objective:  Vital signs in last 24 hours:  Blood pressure (!) 160/110, temperature 97.9 F (36.6 C), temperature source Tympanic, height 6\' 1"  (1.854 m), weight 247 lb 4.8 oz (112.2 kg), SpO2 96 %.    Lymphatics: No inguinal nodes Resp: Lungs clear bilaterally Cardio: Regular rate and rhythm, S3 gallop GI: Nontender, no mass, no hepatosplenomegaly Vascular: No leg edema    Portacath/PICC-without erythema  Lab Results:  Lab Results  Component Value Date   WBC 8.6 01/31/2020   HGB 11.4 (L) 01/31/2020   HCT 35.8 (L) 01/31/2020   MCV 90.4 01/31/2020   PLT 216 01/31/2020   NEUTROABS 6.2 01/31/2020    CMP  Lab Results  Component Value Date   NA 139 01/31/2020   K 4.4 01/31/2020   CL 107 01/31/2020   CO2 25 01/31/2020   GLUCOSE 197 (H) 01/31/2020   BUN 43 (H) 01/31/2020   CREATININE 2.94 (H) 01/31/2020   CALCIUM 8.9 01/31/2020   PROT 7.7 01/31/2020   ALBUMIN 3.3 (L) 01/31/2020   AST 17 01/31/2020   ALT 18 01/31/2020   ALKPHOS 157 (H) 01/31/2020   BILITOT 0.5 01/31/2020   GFRNONAA 24 (L) 01/31/2020   GFRAA 15 (L) 01/03/2020    Lab Results  Component Value Date   CEA1 2.8 01/11/2020    No results found for: INR  Imaging:  IR PICC PLACEMENT RIGHT >5 YRS INC IMG GUIDE  Result Date: 02/18/2020 INDICATION: Patient with history of rectal cancer, in need of venous access for chemotherapy. Request is made for peripherally inserted central venous catheter placement. EXAM: PICC LINE PLACEMENT WITH ULTRASOUND AND FLUOROSCOPIC GUIDANCE MEDICATIONS: 2 mL 1% lidocaine; ANESTHESIA/SEDATION: None FLUOROSCOPY TIME:  Fluoroscopy Time: 0 minutes 24 seconds (10 mGy).  COMPLICATIONS: None immediate. PROCEDURE: The patient was advised of the possible risks and complications and agreed to undergo the procedure. The patient was then brought to the angiographic suite for the procedure. The right arm was prepped with chlorhexidine, draped in the usual sterile fashion using maximum barrier technique (cap and mask, sterile gown, sterile gloves, large sterile sheet, hand hygiene and cutaneous antisepsis) and infiltrated locally with 1% Lidocaine. Ultrasound demonstrated patency of the right basilic vein, and this was documented with an image. Under real-time ultrasound guidance, this vein was accessed with a 21 gauge micropuncture needle and image documentation was performed. A 0.018 wire was introduced in to the vein. Over this, a 5 [place] dual lumen power injectable PICC was advanced to the lower SVC/right atrial junction. Fluoroscopy during the procedure and fluoro spot radiograph confirms appropriate catheter position. The catheter was flushed and covered with a sterile dressing. Catheter length: 40 cm IMPRESSION: Successful right arm power PICC line placement with ultrasound and fluoroscopic guidance. The catheter is ready for use. Read by: Brynda Greathouse PA-C Electronically Signed   By: Aletta Edouard M.D.   On: 02/18/2020 15:57    Medications: I have reviewed the patient's current medications.   Assessment/Plan: 1. Rectal cancer-rectal mass at 5-6 cm from the anal verge on colonoscopy 01/11/2020, biopsy confirmed adenocarcinoma  CTs 01/11/2020-rectal thickening, small perirectal lymph nodes and external iliac nodes, moderate right pleural effusion  MRI pelvis 01/27/2020-T3bN2b tumor  at 7.1 cm from the internal anal sphincter, multiple mesorectal nodes, indistinct left pelvic sidewall node and enlarged bilateral external iliac nodes  PET scan 02/11/2020-hypermetabolic rectosigmoid mass, hypermetabolic mesorectal and sigmoid mesocolon lymph nodes, left external  iliac/obturator node with mild hypermetabolism, 18 mm left inguinal node with normal architecture and slight hypermetabolism  Radiation and infusional 5-FU 02/21/2020 2. Anemia 3. Chronic renal failure 4. Congestive heart failure-severe LVH 5. Diabetes 6. Gout 7. Peripheral neuropathy?    Disposition: Mr. Star appears stable.  I reviewed the PET images with him.  He appears to have locally advanced and potentially metastatic rectal cancer.  The plan is to proceed with radiation and infusional 5-FU.  We again reviewed potential toxicities associated with 5-FU. I will consult with Dr. Lisbeth Renshaw regarding radiation coverage of the hypermetabolic lymph nodes.  Mr. Gerling will return for an office visit in 2 weeks.  Betsy Coder, MD  02/21/2020  8:41 AM

## 2020-02-21 NOTE — Progress Notes (Signed)
Met with patient at registration whom brought documents for [employer].  Patient approved for one-time $1000 [employer] grant to assist with personal expenses while going through treatment. Gave him a copy of the expense sheet and approval letter along with the [facility] OP pharmacy. Patient chose to use all of his funds for housing leaving him with a $0 balance for any other needs. He verbalized understanding.  He has my card for any additional financial questions or concerns.

## 2020-02-21 NOTE — Progress Notes (Signed)
Ok to treat with last CBC and CMP today per Cristy Friedlander, RN per MD Benay Spice.

## 2020-02-21 NOTE — Patient Instructions (Addendum)
[place] Discharge Instructions for Patients Receiving Chemotherapy  Today you received the following chemotherapy agents Adrucil  To help prevent nausea and vomiting after your treatment, we encourage you to take your nausea medication as directed.    If you develop nausea and vomiting that is not controlled by your nausea medication, call the clinic.   BELOW ARE SYMPTOMS THAT SHOULD BE REPORTED IMMEDIATELY:  *FEVER GREATER THAN 100.5 F  *CHILLS WITH OR WITHOUT FEVER  NAUSEA AND VOMITING THAT IS NOT CONTROLLED WITH YOUR NAUSEA MEDICATION  *UNUSUAL SHORTNESS OF BREATH  *UNUSUAL BRUISING OR BLEEDING  TENDERNESS IN MOUTH AND THROAT WITH OR WITHOUT PRESENCE OF ULCERS  *URINARY PROBLEMS  *BOWEL PROBLEMS  UNUSUAL RASH Items with * indicate a potential emergency and should be followed up as soon as possible.  Feel free to call the clinic should you have any questions or concerns. The clinic phone number is [phone].  Please show the [place] at check-in to the Emergency Department and triage nurse.  Fluorouracil, 5-FU injection What is this medicine? FLUOROURACIL, 5-FU (flure oh YOOR a sil) is a chemotherapy drug. It slows the growth of cancer cells. This medicine is used to treat many types of cancer like breast cancer, colon or rectal cancer, pancreatic cancer, and stomach cancer. This medicine may be used for other purposes; ask your health care provider or pharmacist if you have questions. COMMON BRAND NAME(S): Adrucil What should I tell my health care provider before I take this medicine? They need to know if you have any of these conditions:  blood disorders  dihydropyrimidine dehydrogenase (DPD) deficiency  infection (especially a virus infection such as chickenpox, cold sores, or herpes)  kidney disease  liver disease  malnourished, poor nutrition  recent or ongoing radiation therapy  an unusual or allergic reaction to  fluorouracil, other chemotherapy, other medicines, foods, dyes, or preservatives  pregnant or trying to get pregnant  breast-feeding How should I use this medicine? This drug is given as an infusion or injection into a vein. It is administered in a hospital or clinic by a specially trained health care professional. Talk to your pediatrician regarding the use of this medicine in children. Special care may be needed. Overdosage: If you think you have taken too much of this medicine contact a poison control center or emergency room at once. NOTE: This medicine is only for you. Do not share this medicine with others. What if I miss a dose? It is important not to miss your dose. Call your doctor or health care professional if you are unable to keep an appointment. What may interact with this medicine?  allopurinol  cimetidine  dapsone  digoxin  hydroxyurea  leucovorin  levamisole  medicines for seizures like ethotoin, fosphenytoin, phenytoin  medicines to increase blood counts like filgrastim, pegfilgrastim, sargramostim  medicines that treat or prevent blood clots like warfarin, enoxaparin, and dalteparin  methotrexate  metronidazole  pyrimethamine  some other chemotherapy drugs like busulfan, cisplatin, estramustine, vinblastine  trimethoprim  trimetrexate  vaccines Talk to your doctor or health care professional before taking any of these medicines:  acetaminophen  aspirin  ibuprofen  ketoprofen  naproxen This list may not describe all possible interactions. Give your health care provider a list of all the medicines, herbs, non-prescription drugs, or dietary supplements you use. Also tell them if you smoke, drink alcohol, or use illegal drugs. Some items may interact with your medicine. What should I watch for while using  this medicine? Visit your doctor for checks on your progress. This drug may make you feel generally unwell. This is not uncommon, as  chemotherapy can affect healthy cells as well as cancer cells. Report any side effects. Continue your course of treatment even though you feel ill unless your doctor tells you to stop. In some cases, you may be given additional medicines to help with side effects. Follow all directions for their use. Call your doctor or health care professional for advice if you get a fever, chills or sore throat, or other symptoms of a cold or flu. Do not treat yourself. This drug decreases your body's ability to fight infections. Try to avoid being around people who are sick. This medicine may increase your risk to bruise or bleed. Call your doctor or health care professional if you notice any unusual bleeding. Be careful brushing and flossing your teeth or using a toothpick because you may get an infection or bleed more easily. If you have any dental work done, tell your dentist you are receiving this medicine. Avoid taking products that contain aspirin, acetaminophen, ibuprofen, naproxen, or ketoprofen unless instructed by your doctor. These medicines may hide a fever. Do not become pregnant while taking this medicine. Women should inform their doctor if they wish to become pregnant or think they might be pregnant. There is a potential for serious side effects to an unborn child. Talk to your health care professional or pharmacist for more information. Do not breast-feed an infant while taking this medicine. Men should inform their doctor if they wish to father a child. This medicine may lower sperm counts. Do not treat diarrhea with over the counter products. Contact your doctor if you have diarrhea that lasts more than 2 days or if it is severe and watery. This medicine can make you more sensitive to the sun. Keep out of the sun. If you cannot avoid being in the sun, wear protective clothing and use sunscreen. Do not use sun lamps or tanning beds/booths. What side effects may I notice from receiving this  medicine? Side effects that you should report to your doctor or health care professional as soon as possible:  allergic reactions like skin rash, itching or hives, swelling of the face, lips, or tongue  low blood counts - this medicine may decrease the number of white blood cells, red blood cells and platelets. You may be at increased risk for infections and bleeding.  signs of infection - fever or chills, cough, sore throat, pain or difficulty passing urine  signs of decreased platelets or bleeding - bruising, pinpoint red spots on the skin, black, tarry stools, blood in the urine  signs of decreased red blood cells - unusually weak or tired, fainting spells, lightheadedness  breathing problems  changes in vision  chest pain  mouth sores  nausea and vomiting  pain, swelling, redness at site where injected  pain, tingling, numbness in the hands or feet  redness, swelling, or sores on hands or feet  stomach pain  unusual bleeding Side effects that usually do not require medical attention (report to your doctor or health care professional if they continue or are bothersome):  changes in finger or toe nails  diarrhea  dry or itchy skin  hair loss  headache  loss of appetite  sensitivity of eyes to the light  stomach upset  unusually teary eyes This list may not describe all possible side effects. Call your doctor for medical advice about side effects. You  may report side effects to FDA at 1-800-FDA-1088. Where should I keep my medicine? This drug is given in a hospital or clinic and will not be stored at home. NOTE: This sheet is a summary. It may not cover all possible information. If you have questions about this medicine, talk to your doctor, pharmacist, or health care provider.  2020 Elsevier/Gold Standard (2007-07-22 13:53:16)

## 2020-02-22 ENCOUNTER — Telehealth: Payer: Self-pay | Admitting: Oncology

## 2020-02-22 ENCOUNTER — Ambulatory Visit
Admission: RE | Admit: 2020-02-22 | Discharge: 2020-02-22 | Disposition: A | Discharge status: Home / Self Care | Payer: Medicaid Other | Source: Ambulatory Visit | Attending: Radiation Oncology | Admitting: Radiation Oncology

## 2020-02-22 ENCOUNTER — Other Ambulatory Visit: Payer: Self-pay

## 2020-02-22 ENCOUNTER — Telehealth: Payer: Self-pay | Admitting: *Deleted

## 2020-02-22 ENCOUNTER — Ambulatory Visit: Payer: Medicaid Other

## 2020-02-22 DIAGNOSIS — Z510 Encounter for antineoplastic radiation therapy: Secondary | ICD-10-CM | POA: Diagnosis not present

## 2020-02-22 NOTE — Telephone Encounter (Signed)
Scheduled appointments per 11/22 los. Called patient, no answer and no voicemail set up. Will have updated calendar printed for patient at next visit.

## 2020-02-22 NOTE — Telephone Encounter (Signed)
Called pt to see how he did with his treatment yest.  He reports no concerns & pump is working well.  He expresses understanding to call if any problems/concerns & reminded of main ph # to call.

## 2020-02-22 NOTE — Telephone Encounter (Signed)
-----   Message from Veverly Fells, RN sent at 02/21/2020 11:42 AM EST ----- Regarding: SHERRILL: First time chemo Pt started 5FU today, this is pt's first chemo experience. Pt tolerated well with no complaints. His pump will run over 7 days

## 2020-02-23 ENCOUNTER — Ambulatory Visit
Admission: RE | Admit: 2020-02-23 | Discharge: 2020-02-23 | Disposition: A | Discharge status: Home / Self Care | Payer: Medicaid Other | Source: Ambulatory Visit | Attending: Radiation Oncology | Admitting: Radiation Oncology

## 2020-02-23 ENCOUNTER — Ambulatory Visit: Payer: Self-pay | Attending: Family Medicine | Admitting: Family Medicine

## 2020-02-23 ENCOUNTER — Encounter: Payer: Self-pay | Admitting: Family Medicine

## 2020-02-23 ENCOUNTER — Other Ambulatory Visit: Payer: Self-pay

## 2020-02-23 ENCOUNTER — Ambulatory Visit: Payer: Medicaid Other

## 2020-02-23 VITALS — BP 152/95 | HR 72 | Ht 73.0 in | Wt 247.6 lb

## 2020-02-23 DIAGNOSIS — I110 Hypertensive heart disease with heart failure: Secondary | ICD-10-CM

## 2020-02-23 DIAGNOSIS — R399 Unspecified symptoms and signs involving the genitourinary system: Secondary | ICD-10-CM

## 2020-02-23 DIAGNOSIS — I5032 Chronic diastolic (congestive) heart failure: Secondary | ICD-10-CM

## 2020-02-23 DIAGNOSIS — N184 Chronic kidney disease, stage 4 (severe): Secondary | ICD-10-CM

## 2020-02-23 DIAGNOSIS — C20 Malignant neoplasm of rectum: Secondary | ICD-10-CM

## 2020-02-23 DIAGNOSIS — E1169 Type 2 diabetes mellitus with other specified complication: Secondary | ICD-10-CM

## 2020-02-23 DIAGNOSIS — I129 Hypertensive chronic kidney disease with stage 1 through stage 4 chronic kidney disease, or unspecified chronic kidney disease: Secondary | ICD-10-CM

## 2020-02-23 DIAGNOSIS — E1122 Type 2 diabetes mellitus with diabetic chronic kidney disease: Secondary | ICD-10-CM

## 2020-02-23 DIAGNOSIS — E785 Hyperlipidemia, unspecified: Secondary | ICD-10-CM

## 2020-02-23 DIAGNOSIS — Z510 Encounter for antineoplastic radiation therapy: Secondary | ICD-10-CM | POA: Diagnosis not present

## 2020-02-23 LAB — POCT GLYCOSYLATED HEMOGLOBIN (HGB A1C): HbA1c, POC (controlled diabetic range): 6.4 % (ref 0.0–7.0)

## 2020-02-23 LAB — GLUCOSE, POCT (MANUAL RESULT ENTRY): POC Glucose: 82 mg/dl (ref 70–99)

## 2020-02-23 MED ORDER — FUROSEMIDE 20 MG PO TABS: 60.0000 mg | ORAL_TABLET | Freq: Two times a day (BID) | ORAL | 2 refills | Status: DC

## 2020-02-23 MED ORDER — TAMSULOSIN HCL 0.4 MG PO CAPS: 0.4000 mg | ORAL_CAPSULE | Freq: Every day | ORAL | 2 refills | Status: DC

## 2020-02-23 MED ORDER — GABAPENTIN 100 MG PO CAPS
100.0000 mg | ORAL_CAPSULE | Freq: Every day | ORAL | 0 refills | Status: DC
Start: 2020-02-23 — End: 2020-03-20

## 2020-02-23 MED ORDER — GLIPIZIDE 5 MG PO TABS: 5.0000 mg | ORAL_TABLET | Freq: Two times a day (BID) | ORAL | 6 refills | Status: DC

## 2020-02-23 MED ORDER — ATORVASTATIN CALCIUM 40 MG PO TABS: 40.0000 mg | ORAL_TABLET | Freq: Every day | ORAL | 2 refills | Status: DC

## 2020-02-23 MED ORDER — HYDRALAZINE HCL 50 MG PO TABS: 50.0000 mg | ORAL_TABLET | Freq: Two times a day (BID) | ORAL | 2 refills | Status: DC

## 2020-02-23 MED FILL — ?GLIPIZIDE 5MG TABLET: 5 | 30 days supply | Qty: 60 | Fill #0

## 2020-02-23 MED FILL — FUROSEMIDE 20 MG TABS: 20 | 30 days supply | Qty: 180 | Fill #0

## 2020-02-23 NOTE — Patient Instructions (Signed)
Hypoglycemia Hypoglycemia is when the sugar (glucose) level in your blood is too low. Signs of low blood sugar may include:  Feeling: ? Hungry. ? Worried or nervous (anxious). ? Sweaty and clammy. ? Confused. ? Dizzy. ? Sleepy. ? Sick to your stomach (nauseous).  Having: ? A fast heartbeat. ? A headache. ? A change in your vision. ? Tingling or no feeling (numbness) around your mouth, lips, or tongue. ? Jerky movements that you cannot control (seizure).  Having trouble with: ? Moving (coordination). ? Sleeping. ? Passing out (fainting). ? Getting upset easily (irritability). Low blood sugar can happen to people who have diabetes and people who do not have diabetes. Low blood sugar can happen quickly, and it can be an emergency. Treating low blood sugar Low blood sugar is often treated by eating or drinking something sugary right away, such as:  Fruit juice, 4-6 oz (120-150 mL).  Regular soda (not diet soda), 4-6 oz (120-150 mL).  Low-fat milk, 4 oz (120 mL).  Several pieces of hard candy.  Sugar or honey, 1 Tbsp (15 mL). Treating low blood sugar if you have diabetes If you can think clearly and swallow safely, follow the 15:15 rule:  Take 15 grams of a fast-acting carb (carbohydrate). Talk with your doctor about how much you should take.  Always keep a source of fast-acting carb with you, such as: ? Sugar tablets (glucose pills). Take 3-4 pills. ? 6-8 pieces of hard candy. ? 4-6 oz (120-150 mL) of fruit juice. ? 4-6 oz (120-150 mL) of regular (not diet) soda. ? 1 Tbsp (15 mL) honey or sugar.  Check your blood sugar 15 minutes after you take the carb.  If your blood sugar is still at or below 70 mg/dL (3.9 mmol/L), take 15 grams of a carb again.  If your blood sugar does not go above 70 mg/dL (3.9 mmol/L) after 3 tries, get help right away.  After your blood sugar goes back to normal, eat a meal or a snack within 1 hour.  Treating very low blood sugar If your  blood sugar is at or below 54 mg/dL (3 mmol/L), you have very low blood sugar (severe hypoglycemia). This may also cause:  Passing out.  Jerky movements you cannot control (seizure).  Losing consciousness (coma). This is an emergency. Do not wait to see if the symptoms will go away. Get medical help right away. Call your local emergency services (911 in the [place] Do not drive yourself to the hospital. If you have very low blood sugar and you cannot eat or drink, you may need a glucagon shot (injection). A family member or friend should learn how to check your blood sugar and how to give you a glucagon shot. Ask your doctor if you need to have a glucagon shot kit at home. Follow these instructions at home: General instructions  Take over-the-counter and prescription medicines only as told by your doctor.  Stay aware of your blood sugar as told by your doctor.  Limit alcohol intake to no more than 1 drink a day for nonpregnant women and 2 drinks a day for men. One drink equals 12 oz of beer (355 mL), 5 oz of wine (148 mL), or 1 oz of hard liquor (44 mL).  Keep all follow-up visits as told by your doctor. This is important. If you have diabetes:   Follow your diabetes care plan as told by your doctor. Make sure you: ? Know the signs of low blood sugar. ?  Take your medicines as told. ? Follow your exercise and meal plan. ? Eat on time. Do not skip meals. ? Check your blood sugar as often as told by your doctor. Always check it before and after exercise. ? Follow your sick day plan when you cannot eat or drink normally. Make this plan ahead of time with your doctor.  Share your diabetes care plan with: ? Your work or school. ? People you live with.  Check your pee (urine) for ketones: ? When you are sick. ? As told by your doctor.  Carry a card or wear jewelry that says you have diabetes. Contact a doctor if:  You have trouble keeping your blood sugar in your target  range.  You have low blood sugar often. Get help right away if:  You still have symptoms after you eat or drink something sugary.  Your blood sugar is at or below 54 mg/dL (3 mmol/L).  You have jerky movements that you cannot control.  You pass out. These symptoms may be an emergency. Do not wait to see if the symptoms will go away. Get medical help right away. Call your local emergency services (911 in the [place] Do not drive yourself to the hospital. Summary  Hypoglycemia happens when the level of sugar (glucose) in your blood is too low.  Low blood sugar can happen to people who have diabetes and people who do not have diabetes. Low blood sugar can happen quickly, and it can be an emergency.  Make sure you know the signs of low blood sugar and know how to treat it.  Always keep a source of sugar (fast-acting carb) with you to treat low blood sugar. This information is not intended to replace advice given to you by your health care provider. Make sure you discuss any questions you have with your health care provider. Document Revised: 07/09/2018 Document Reviewed: 04/21/2015 Elsevier Patient Education  2020 Elsevier Inc.  

## 2020-02-23 NOTE — Progress Notes (Signed)
Pt here for patient teaching.  Pt given Radiation and You booklet.  Reviewed areas of pertinence such as diarrhea, fatigue, hair loss, sexual and fertility changes, skin changes and urinary and bladder changes . Pt able to give teach back of to pat skin, use unscented/gentle soap, use baby wipes, have Imodium on hand, drink plenty of water and sitz bath,avoid applying anything to skin within 4 hours of treatment. Pt verbalizes understanding of information given and will contact nursing with any questions or concerns.     Oden Lindaman M. Bridget  RN, BSN     

## 2020-02-23 NOTE — Progress Notes (Signed)
Subjective:  Patient ID: Craig West, male    DOB: [birth year]  Age: 54 y.o. MRN: [id number]  CC: Diabetes and Hypertension   HPI Craig West  is a 54 year old male with a history of hypertension, type 2 diabetes mellitus (A1c 6.4), asthma, gout,stage IV chronic kidney disease, Rectal carcinoma diagnosed in 12/2018 (currently undergoing chemotherapy and radiation)  He has several appointments and it is difficult to keep up. He is yet to see Nephrology. His appetitie is good and he denies fatigue, malaise and he appears to be in good spirits.  Denies dyspnea or chest pain, pedal edema.  His A1c is 6.4 down from 7.6 previously and he has been on 10 mg of glipizide twice daily.  He complains of difficulty affording his medications as his last set of refills are paid for by the cancer center.  His BP is elevated this morning in the Clinic however he states BP at the cancer center was 123/99 this morning. He has no acute concerns.  Past Medical History:  Diagnosis Date  . Asthma   . CHF (congestive heart failure) ([place])   . Diabetes mellitus without complication ([place])   . Gout   . Hypertension     Past Surgical History:  Procedure Laterality Date  . BIOPSY  01/11/2020   Procedure: BIOPSY;  Surgeon: Irene Shipper, MD;  Location: [facility] ENDOSCOPY;  Service: Endoscopy;;  . COLONOSCOPY WITH PROPOFOL N/A 01/11/2020   Procedure: COLONOSCOPY WITH PROPOFOL;  Surgeon: Irene Shipper, MD;  Location: [facility];  Service: Endoscopy;  Laterality: N/A;  . NO PAST SURGERIES    . POLYPECTOMY  01/11/2020   Procedure: POLYPECTOMY;  Surgeon: Irene Shipper, MD;  Location: [facility] ENDOSCOPY;  Service: Endoscopy;;  . SUBMUCOSAL TATTOO INJECTION  01/11/2020   Procedure: SUBMUCOSAL TATTOO INJECTION;  Surgeon: Irene Shipper, MD;  Location[facility] ENDOSCOPY;  Service: Endoscopy;;    Family History  Problem Relation Age of Onset  . Heart failure Brother     No Known Allergies  Outpatient Medications Prior to  Visit  Medication Sig Dispense Refill  . albuterol (VENTOLIN HFA) 108 (90 Base) MCG/ACT inhaler Inhale 2 puffs into the lungs every 6 (six) hours as needed for wheezing or shortness of breath. 18 g 2  . allopurinol (ZYLOPRIM) 100 MG tablet Take 200 mg by mouth daily.    Marland Kitchen amLODipine (NORVASC) 5 MG tablet Take 1 tablet (5 mg total) by mouth daily. For hypertension 30 tablet 3  . Blood Glucose Monitoring Suppl (TRUE METRIX METER) w/Device KIT Check blood sugar fasting and ant bedtime and record 1 kit 0  . carvedilol (COREG) 12.5 MG tablet Take 1 tablet (12.5 mg total) by mouth 2 (two) times daily with a meal. 60 tablet 2  . glucose blood (TRUE METRIX BLOOD GLUCOSE TEST) test strip Use as instructed 200 each 12  . Misc. Devices (DIGITAL GLASS SCALE) MISC Use scale to weight yourself. Increasing weight may indicate fluid overload 1 each 0  . TRUEplus Lancets 28G MISC Check blood sugar fasting and at bedtime 210 each 2  . atorvastatin (LIPITOR) 40 MG tablet Take 1 tablet (40 mg total) by mouth daily. For hypercholesterolemia 30 tablet 2  . furosemide (LASIX) 20 MG tablet Take 3 tablets (60 mg total) by mouth 2 (two) times daily. 180 tablet 0  . gabapentin (NEURONTIN) 100 MG capsule Take 1 capsule (100 mg total) by mouth at bedtime. 30 capsule 0  . glipiZIDE (GLUCOTROL) 10 MG tablet Take 1  tablet (10 mg total) by mouth 2 (two) times daily before a meal. 60 tablet 2  . hydrALAZINE (APRESOLINE) 50 MG tablet Take 1 tablet (50 mg total) by mouth every 12 (twelve) hours. 60 tablet 2  . tamsulosin (FLOMAX) 0.4 MG CAPS capsule Take 1 capsule (0.4 mg total) by mouth daily. 30 capsule 2  . colchicine 0.6 MG tablet Take 2 tablets (1.2 mg) orally at the onset of a gout attack; may repeat 1 tablet (0.6 mg) in 2 hours if symptoms persist (Patient not taking: Reported on 02/21/2020) 30 tablet 2  . prochlorperazine (COMPAZINE) 10 MG tablet Take 1 tablet (10 mg total) by mouth every 6 (six) hours as needed for nausea.  (Patient not taking: Reported on 02/21/2020) 60 tablet 0   No facility-administered medications prior to visit.     ROS Review of Systems  Constitutional: Negative for activity change and appetite change.  HENT: Negative for sinus pressure and sore throat.   Eyes: Negative for visual disturbance.  Respiratory: Negative for cough, chest tightness and shortness of breath.   Cardiovascular: Negative for chest pain and leg swelling.  Gastrointestinal: Negative for abdominal distention, abdominal pain, constipation and diarrhea.  Endocrine: Negative.   Genitourinary: Negative for dysuria.  Musculoskeletal: Negative for joint swelling and myalgias.  Skin: Negative for rash.  Allergic/Immunologic: Negative.   Neurological: Negative for weakness, light-headedness and numbness.  Psychiatric/Behavioral: Negative for dysphoric mood and suicidal ideas.    Objective:  BP (!) 152/95   Pulse 72   Ht _0  (1.854 m)   Wt 247 lb 9.6 oz (112.3 kg)   SpO2 95%   BMI 32.67 kg/m   BP/Weight 02/23/2020 02/21/2020 93/71/6967  Systolic BP 893 810 175  Diastolic BP 95 92 102  Wt. (Lbs) 247.6 - 247.3  BMI 32.67 - 32.63      Physical Exam Constitutional:      Appearance: He is well-developed.  Neck:     Vascular: No JVD.  Cardiovascular:     Rate and Rhythm: Normal rate.     Heart sounds: Normal heart sounds. No murmur heard.   Pulmonary:     Effort: Pulmonary effort is normal.     Breath sounds: Normal breath sounds. No wheezing or rales.  Chest:     Chest wall: No tenderness.  Abdominal:     General: Bowel sounds are normal. There is no distension.     Palpations: Abdomen is soft. There is no mass.     Tenderness: There is no abdominal tenderness.  Musculoskeletal:        General: Normal range of motion.     Right lower leg: No edema.     Left lower leg: No edema.     Comments: R arm PICC line  Neurological:     Mental Status: He is alert and oriented to person, place, and  time.  Psychiatric:        Mood and Affect: Mood normal.     CMP Latest Ref Rng & Units 01/31/2020 01/11/2020 01/10/2020  Glucose 70 - 99 mg/dL 197(H) 103(H) 98  BUN 6 - 20 mg/dL 43(H) 67(H) 81(H)  Creatinine 0.61 - 1.24 mg/dL 2.94(H) 3.45(H) 3.67(H)  Sodium 135 - 145 mmol/L 139 143 141  Potassium 3.5 - 5.1 mmol/L 4.4 4.1 3.8  Chloride 98 - 111 mmol/L 107 108 105  CO2 22 - 32 mmol/L _1 Calcium 8.9 - 10.3 mg/dL 8.9 8.6(L) 8.0(L)  Total Protein 6.5 - 8.1 g/dL  7.7 - -  Total Bilirubin 0.3 - 1.2 mg/dL 0.5 - -  Alkaline Phos 38 - 126 U/L 157(H) - -  AST 15 - 41 U/L 17 - -  ALT 0 - 44 U/L 18 - -    Lipid Panel     Component Value Date/Time   CHOL 167 04/27/2019 1020   TRIG 226 (H) 04/27/2019 1020   HDL 30 (L) 04/27/2019 1020   CHOLHDL 5.6 (H) 04/27/2019 1020   CHOLHDL 5.2 (H) 10/23/2015 1009   VLDL 46 (H) 10/23/2015 1009   LDLCALC 98 04/27/2019 1020    CBC    Component Value Date/Time   WBC 8.6 01/31/2020 0859   WBC 13.3 (H) 01/11/2020 0311   RBC 3.96 (L) 01/31/2020 0859   HGB 11.4 (L) 01/31/2020 0859   HGB 14.5 07/11/2016 0949   HCT 35.8 (L) 01/31/2020 0859   HCT 43.0 07/11/2016 0949   PLT 216 01/31/2020 0859   PLT 223 07/11/2016 0949   MCV 90.4 01/31/2020 0859   MCV 89 07/11/2016 0949   MCH 28.8 01/31/2020 0859   MCHC 31.8 01/31/2020 0859   RDW 14.9 01/31/2020 0859   RDW 13.8 07/11/2016 0949   LYMPHSABS 1.4 01/31/2020 0859   LYMPHSABS 0.7 07/11/2016 0949   MONOABS 0.7 01/31/2020 0859   EOSABS 0.3 01/31/2020 0859   EOSABS 0.0 07/11/2016 0949   BASOSABS 0.0 01/31/2020 0859   BASOSABS 0.0 07/11/2016 0949    Lab Results  Component Value Date   HGBA1C 6.4 02/23/2020    Assessment & Plan:  1. Type 2 diabetes mellitus with stage 4 chronic kidney disease, without long-term current use of insulin (HCC) Controlled with A1c of 6.4; goal is less than 7.5 due to multiple comorbidities He is at risk of hypoglycemia Glipizide dose decreased from 10 mg twice  daily to 5 mg twice daily Counseled on Diabetic diet, my plate method, 945 minutes of moderate intensity exercise/week Blood sugar logs with fasting goals of 80-120 mg/dl, random of less than 180 and in the event of sugars less than 60 mg/dl or greater than 400 mg/dl encouraged to notify the clinic. Advised on the need for annual eye exams, annual foot exams, Pneumonia vaccine. - POCT glucose (manual entry) - POCT glycosylated hemoglobin (Hb A1C) - glipiZIDE (GLUCOTROL) 5 MG tablet; Take 1 tablet (5 mg total) by mouth 2 (two) times daily before a meal.  Dispense: 60 tablet; Refill: 6 - atorvastatin (LIPITOR) 40 MG tablet; Take 1 tablet (40 mg total) by mouth daily. For hypercholesterolemia  Dispense: 30 tablet; Refill: 2  2. Lower urinary tract symptoms Stable - tamsulosin (FLOMAX) 0.4 MG CAPS capsule; Take 1 capsule (0.4 mg total) by mouth daily.  Dispense: 30 capsule; Refill: 2  3. Hypertension in chronic kidney disease due to type 2 diabetes mellitus ([place]) Uncontrolled Given blood pressure was normal at his oncology appointment I will hold off on making regimen change - hydrALAZINE (APRESOLINE) 50 MG tablet; Take 1 tablet (50 mg total) by mouth every 12 (twelve) hours.  Dispense: 60 tablet; Refill: 2  4. Hyperlipidemia associated with type 2 diabetes mellitus ([place]) Continue with statin  5. Hypertensive heart disease with chronic diastolic congestive heart failure (HCC) Euvolemic with EF of 60 to 65% - furosemide (LASIX) 20 MG tablet; Take 3 tablets (60 mg total) by mouth 2 (two) times daily.  Dispense: 180 tablet; Refill: 2  6. Rectal cancer [facility]) Currently undergoing chemotherapy and radiation   Meds ordered this encounter  Medications  .  glipiZIDE (GLUCOTROL) 5 MG tablet    Sig: Take 1 tablet (5 mg total) by mouth 2 (two) times daily before a meal.    Dispense:  60 tablet    Refill:  6    Dose decrease  . tamsulosin (FLOMAX) 0.4 MG CAPS capsule    Sig: Take 1 capsule (0.4 mg  total) by mouth daily.    Dispense:  30 capsule    Refill:  2  . hydrALAZINE (APRESOLINE) 50 MG tablet    Sig: Take 1 tablet (50 mg total) by mouth every 12 (twelve) hours.    Dispense:  60 tablet    Refill:  2  . gabapentin (NEURONTIN) 100 MG capsule    Sig: Take 1 capsule (100 mg total) by mouth at bedtime.    Dispense:  30 capsule    Refill:  0  . furosemide (LASIX) 20 MG tablet    Sig: Take 3 tablets (60 mg total) by mouth 2 (two) times daily.    Dispense:  180 tablet    Refill:  2  . atorvastatin (LIPITOR) 40 MG tablet    Sig: Take 1 tablet (40 mg total) by mouth daily. For hypercholesterolemia    Dispense:  30 tablet    Refill:  2    Discontinue previous dose    Follow-up: Return in about 3 months (around 05/25/2020) for Chronic disease management.       Charlott Rakes, MD, FAAFP. [facility] and [place], [place]   02/23/2020, 1:02 PM

## 2020-02-26 ENCOUNTER — Other Ambulatory Visit: Payer: Self-pay | Admitting: Oncology

## 2020-02-28 ENCOUNTER — Telehealth: Payer: Self-pay

## 2020-02-28 ENCOUNTER — Inpatient Hospital Stay: Payer: Medicaid Other

## 2020-02-28 ENCOUNTER — Encounter: Payer: Self-pay | Admitting: *Deleted

## 2020-02-28 ENCOUNTER — Ambulatory Visit
Admission: RE | Admit: 2020-02-28 | Discharge: 2020-02-28 | Disposition: A | Discharge status: Home / Self Care | Payer: Medicaid Other | Source: Ambulatory Visit | Attending: Radiation Oncology | Admitting: Radiation Oncology

## 2020-02-28 ENCOUNTER — Other Ambulatory Visit: Payer: Self-pay

## 2020-02-28 ENCOUNTER — Ambulatory Visit: Payer: Medicaid Other

## 2020-02-28 DIAGNOSIS — Z5111 Encounter for antineoplastic chemotherapy: Secondary | ICD-10-CM | POA: Diagnosis not present

## 2020-02-28 DIAGNOSIS — Z510 Encounter for antineoplastic radiation therapy: Secondary | ICD-10-CM | POA: Diagnosis not present

## 2020-02-28 DIAGNOSIS — C20 Malignant neoplasm of rectum: Secondary | ICD-10-CM

## 2020-02-28 LAB — CBC WITH DIFFERENTIAL (CANCER CENTER ONLY)
Abs Immature Granulocytes: 0.09 10*3/uL — ABNORMAL HIGH (ref 0.00–0.07)
Basophils Absolute: 0.1 10*3/uL (ref 0.0–0.1)
Basophils Relative: 1 %
Eosinophils Absolute: 0.3 10*3/uL (ref 0.0–0.5)
Eosinophils Relative: 3 %
HCT: 34.1 % — ABNORMAL LOW (ref 39.0–52.0)
Hemoglobin: 11 g/dL — ABNORMAL LOW (ref 13.0–17.0)
Immature Granulocytes: 1 %
Lymphocytes Relative: 17 %
Lymphs Abs: 1.4 10*3/uL (ref 0.7–4.0)
MCH: 28.5 pg (ref 26.0–34.0)
MCHC: 32.3 g/dL (ref 30.0–36.0)
MCV: 88.3 fL (ref 80.0–100.0)
Monocytes Absolute: 0.6 10*3/uL (ref 0.1–1.0)
Monocytes Relative: 7 %
Neutro Abs: 5.9 10*3/uL (ref 1.7–7.7)
Neutrophils Relative %: 71 %
Platelet Count: 220 10*3/uL (ref 150–400)
RBC: 3.86 MIL/uL — ABNORMAL LOW (ref 4.22–5.81)
RDW: 15.5 % (ref 11.5–15.5)
WBC Count: 8.3 10*3/uL (ref 4.0–10.5)
nRBC: 0 % (ref 0.0–0.2)

## 2020-02-28 LAB — CMP (CANCER CENTER ONLY)
ALT: 15 U/L (ref 0–44)
AST: 16 U/L (ref 15–41)
Albumin: 3.1 g/dL — ABNORMAL LOW (ref 3.5–5.0)
Alkaline Phosphatase: 143 U/L — ABNORMAL HIGH (ref 38–126)
Anion gap: 9 (ref 5–15)
BUN: 45 mg/dL — ABNORMAL HIGH (ref 6–20)
CO2: 22 mmol/L (ref 22–32)
Calcium: 8.7 mg/dL — ABNORMAL LOW (ref 8.9–10.3)
Chloride: 109 mmol/L (ref 98–111)
Creatinine: 3.16 mg/dL (ref 0.61–1.24)
GFR, Estimated: 22 mL/min — ABNORMAL LOW
Glucose, Bld: 98 mg/dL (ref 70–99)
Potassium: 4.5 mmol/L (ref 3.5–5.1)
Sodium: 140 mmol/L (ref 135–145)
Total Bilirubin: 0.5 mg/dL (ref 0.3–1.2)
Total Protein: 7.4 g/dL (ref 6.5–8.1)

## 2020-02-28 MED ORDER — SODIUM CHLORIDE 0.9 % IV SOLN
225.0000 mg/m2/d | INTRAVENOUS | Status: DC
  Administered 2020-02-28: 3850 mg via INTRAVENOUS
  Filled 2020-02-28: qty 77

## 2020-02-28 NOTE — Patient Instructions (Signed)
[facility] Discharge Instructions for Patients Receiving Chemotherapy  Today you received the following chemotherapy agents Adrucil  To help prevent nausea and vomiting after your treatment, we encourage you to take your nausea medication as directed.    If you develop nausea and vomiting that is not controlled by your nausea medication, call the clinic.   BELOW ARE SYMPTOMS THAT SHOULD BE REPORTED IMMEDIATELY:  *FEVER GREATER THAN 100.5 F  *CHILLS WITH OR WITHOUT FEVER  NAUSEA AND VOMITING THAT IS NOT CONTROLLED WITH YOUR NAUSEA MEDICATION  *UNUSUAL SHORTNESS OF BREATH  *UNUSUAL BRUISING OR BLEEDING  TENDERNESS IN MOUTH AND THROAT WITH OR WITHOUT PRESENCE OF ULCERS  *URINARY PROBLEMS  *BOWEL PROBLEMS  UNUSUAL RASH Items with * indicate a potential emergency and should be followed up as soon as possible.  Feel free to call the clinic should you have any questions or concerns. The clinic phone number is [phone].  Please show the [place] at check-in to the Emergency Department and triage nurse.  Fluorouracil, 5-FU injection What is this medicine? FLUOROURACIL, 5-FU (flure oh YOOR a sil) is a chemotherapy drug. It slows the growth of cancer cells. This medicine is used to treat many types of cancer like breast cancer, colon or rectal cancer, pancreatic cancer, and stomach cancer. This medicine may be used for other purposes; ask your health care provider or pharmacist if you have questions. COMMON BRAND NAME(S): Adrucil What should I tell my health care provider before I take this medicine? They need to know if you have any of these conditions:  blood disorders  dihydropyrimidine dehydrogenase (DPD) deficiency  infection (especially a virus infection such as chickenpox, cold sores, or herpes)  kidney disease  liver disease  malnourished, poor nutrition  recent or ongoing radiation therapy  an unusual or allergic reaction to  fluorouracil, other chemotherapy, other medicines, foods, dyes, or preservatives  pregnant or trying to get pregnant  breast-feeding How should I use this medicine? This drug is given as an infusion or injection into a vein. It is administered in a hospital or clinic by a specially trained health care professional. Talk to your pediatrician regarding the use of this medicine in children. Special care may be needed. Overdosage: If you think you have taken too much of this medicine contact a poison control center or emergency room at once. NOTE: This medicine is only for you. Do not share this medicine with others. What if I miss a dose? It is important not to miss your dose. Call your doctor or health care professional if you are unable to keep an appointment. What may interact with this medicine?  allopurinol  cimetidine  dapsone  digoxin  hydroxyurea  leucovorin  levamisole  medicines for seizures like ethotoin, fosphenytoin, phenytoin  medicines to increase blood counts like filgrastim, pegfilgrastim, sargramostim  medicines that treat or prevent blood clots like warfarin, enoxaparin, and dalteparin  methotrexate  metronidazole  pyrimethamine  some other chemotherapy drugs like busulfan, cisplatin, estramustine, vinblastine  trimethoprim  trimetrexate  vaccines Talk to your doctor or health care professional before taking any of these medicines:  acetaminophen  aspirin  ibuprofen  ketoprofen  naproxen This list may not describe all possible interactions. Give your health care provider a list of all the medicines, herbs, non-prescription drugs, or dietary supplements you use. Also tell them if you smoke, drink alcohol, or use illegal drugs. Some items may interact with your medicine. What should I watch for while using  this medicine? Visit your doctor for checks on your progress. This drug may make you feel generally unwell. This is not uncommon, as  chemotherapy can affect healthy cells as well as cancer cells. Report any side effects. Continue your course of treatment even though you feel ill unless your doctor tells you to stop. In some cases, you may be given additional medicines to help with side effects. Follow all directions for their use. Call your doctor or health care professional for advice if you get a fever, chills or sore throat, or other symptoms of a cold or flu. Do not treat yourself. This drug decreases your body's ability to fight infections. Try to avoid being around people who are sick. This medicine may increase your risk to bruise or bleed. Call your doctor or health care professional if you notice any unusual bleeding. Be careful brushing and flossing your teeth or using a toothpick because you may get an infection or bleed more easily. If you have any dental work done, tell your dentist you are receiving this medicine. Avoid taking products that contain aspirin, acetaminophen, ibuprofen, naproxen, or ketoprofen unless instructed by your doctor. These medicines may hide a fever. Do not become pregnant while taking this medicine. Women should inform their doctor if they wish to become pregnant or think they might be pregnant. There is a potential for serious side effects to an unborn child. Talk to your health care professional or pharmacist for more information. Do not breast-feed an infant while taking this medicine. Men should inform their doctor if they wish to father a child. This medicine may lower sperm counts. Do not treat diarrhea with over the counter products. Contact your doctor if you have diarrhea that lasts more than 2 days or if it is severe and watery. This medicine can make you more sensitive to the sun. Keep out of the sun. If you cannot avoid being in the sun, wear protective clothing and use sunscreen. Do not use sun lamps or tanning beds/booths. What side effects may I notice from receiving this  medicine? Side effects that you should report to your doctor or health care professional as soon as possible:  allergic reactions like skin rash, itching or hives, swelling of the face, lips, or tongue  low blood counts - this medicine may decrease the number of white blood cells, red blood cells and platelets. You may be at increased risk for infections and bleeding.  signs of infection - fever or chills, cough, sore throat, pain or difficulty passing urine  signs of decreased platelets or bleeding - bruising, pinpoint red spots on the skin, black, tarry stools, blood in the urine  signs of decreased red blood cells - unusually weak or tired, fainting spells, lightheadedness  breathing problems  changes in vision  chest pain  mouth sores  nausea and vomiting  pain, swelling, redness at site where injected  pain, tingling, numbness in the hands or feet  redness, swelling, or sores on hands or feet  stomach pain  unusual bleeding Side effects that usually do not require medical attention (report to your doctor or health care professional if they continue or are bothersome):  changes in finger or toe nails  diarrhea  dry or itchy skin  hair loss  headache  loss of appetite  sensitivity of eyes to the light  stomach upset  unusually teary eyes This list may not describe all possible side effects. Call your doctor for medical advice about side effects. You  may report side effects to FDA at 1-800-FDA-1088. Where should I keep my medicine? This drug is given in a hospital or clinic and will not be stored at home. NOTE: This sheet is a summary. It may not cover all possible information. If you have questions about this medicine, talk to your doctor, pharmacist, or health care provider.  2020 Elsevier/Gold Standard (2007-07-22 13:53:16)

## 2020-02-28 NOTE — Progress Notes (Signed)
CRITICAL VALUE STICKER  CRITICAL VALUE:creatinine 3.16  RECEIVER (on-site recipient of call): Manuela Schwartz   DATE & TIME NOTIFIED: 02/28/20 @ 1013  MESSENGER (representative from lab): Jeannette Corpus, CMA  MD NOTIFIED: Dr. Benay Spice  TIME OF NOTIFICATION:1025  RESPONSE: No changes, he is has chronic renal failure.

## 2020-02-28 NOTE — Telephone Encounter (Signed)
CRITICAL VALUE STICKER  CRITICAL VALUE: Creatinine = 3.16  RECEIVER (on-site recipient of call): Yetta Glassman, [place] NOTIFIED: 02/28/20 at 10:10am  MESSENGER (representative from lab): Anderson Malta  MD NOTIFIED: Dr. Benay Spice  TIME OF NOTIFICATION: 10:15am  RESPONSE: Notification given to Manuela Schwartz, RN for follow-up with provider.

## 2020-02-28 NOTE — Progress Notes (Signed)
Upon arriving for his pump DC and Picc dressing change his Pump said stopped with only 26.2 ml given. Pump was still in Lock Level 2. According to calculations it was stopped around 29 hours. Called Dr. Benay Spice, wanted pump to be removed and new pump started today.

## 2020-02-28 NOTE — Progress Notes (Signed)
Ok to treat today with elevated Scr per Dr. Benay Spice

## 2020-02-29 ENCOUNTER — Ambulatory Visit
Admission: RE | Admit: 2020-02-29 | Discharge: 2020-02-29 | Disposition: A | Discharge status: Home / Self Care | Payer: Medicaid Other | Source: Ambulatory Visit | Attending: Radiation Oncology | Admitting: Radiation Oncology

## 2020-02-29 ENCOUNTER — Ambulatory Visit: Payer: Medicaid Other

## 2020-02-29 DIAGNOSIS — Z510 Encounter for antineoplastic radiation therapy: Secondary | ICD-10-CM | POA: Diagnosis not present

## 2020-03-01 ENCOUNTER — Other Ambulatory Visit: Payer: Self-pay

## 2020-03-01 ENCOUNTER — Ambulatory Visit
Admission: RE | Admit: 2020-03-01 | Discharge: 2020-03-01 | Disposition: A | Discharge status: Home / Self Care | Payer: Medicaid Other | Source: Ambulatory Visit | Attending: Radiation Oncology | Admitting: Radiation Oncology

## 2020-03-01 ENCOUNTER — Ambulatory Visit: Payer: Medicaid Other

## 2020-03-01 DIAGNOSIS — C20 Malignant neoplasm of rectum: Secondary | ICD-10-CM | POA: Diagnosis not present

## 2020-03-02 ENCOUNTER — Ambulatory Visit
Admission: RE | Admit: 2020-03-02 | Discharge: 2020-03-02 | Disposition: A | Discharge status: Home / Self Care | Payer: Medicaid Other | Source: Ambulatory Visit | Attending: Radiation Oncology | Admitting: Radiation Oncology

## 2020-03-02 ENCOUNTER — Ambulatory Visit: Payer: Medicaid Other

## 2020-03-02 DIAGNOSIS — C20 Malignant neoplasm of rectum: Secondary | ICD-10-CM | POA: Diagnosis not present

## 2020-03-03 ENCOUNTER — Ambulatory Visit
Admission: RE | Admit: 2020-03-03 | Discharge: 2020-03-03 | Disposition: A | Discharge status: Home / Self Care | Payer: Medicaid Other | Source: Ambulatory Visit | Attending: Radiation Oncology | Admitting: Radiation Oncology

## 2020-03-03 ENCOUNTER — Telehealth: Payer: Self-pay | Admitting: *Deleted

## 2020-03-03 ENCOUNTER — Ambulatory Visit: Payer: Medicaid Other

## 2020-03-03 DIAGNOSIS — C20 Malignant neoplasm of rectum: Secondary | ICD-10-CM | POA: Diagnosis not present

## 2020-03-03 NOTE — Telephone Encounter (Signed)
Patient called @ 4:15 p.m. after everyone has left to say that he will not be able to make the radiation treatment appointment @ 7:30 a.m. Monday. He said that he will be here as soon as he gets out of court.

## 2020-03-05 ENCOUNTER — Other Ambulatory Visit: Payer: Self-pay | Admitting: Nurse Practitioner

## 2020-03-05 ENCOUNTER — Other Ambulatory Visit: Payer: Self-pay | Admitting: Oncology

## 2020-03-05 DIAGNOSIS — C20 Malignant neoplasm of rectum: Secondary | ICD-10-CM

## 2020-03-05 NOTE — Progress Notes (Signed)
Craig West   [phone] [phone]   Clinic Follow up Note   Patient Care Team: Newlin, Enobong, MD as PCP - General (Family Medicine) [place], Tiffany, MD as PCP - Cardiology (Cardiology) Anderson, Cheryl L, RN as Oncology Nurse Navigator Sherrill, Gary B, MD as Consulting Physician (Oncology) 03/06/2020  CHIEF COMPLAINT: Follow up rectal cancer   CURRENT THERAPY: Radiation and infusional 5FU, starting 02/21/20  INTERVAL HISTORY: Craig West returns for follow up and treatment as scheduled. He completed week 2 chemoRT with 5FU 11/29 -12/3.  He notes the pump was "off and on" for the first week but operated normally last week.  He is doing well with treatment.  Very little rectal bleeding, no pain.  Bowels moving normally, no nausea or vomiting.  Energy and appetite are adequate, denies mucositis.  No recent fever, chills, cough, chest pain, hand-foot syndrome.  Leg edema at baseline.  He is in need of financial assistance.   MEDICAL HISTORY:  Past Medical History:  Diagnosis Date  . Asthma   . CHF (congestive heart failure) (HCC)   . Diabetes mellitus without complication (HCC)   . Gout   . Hypertension     SURGICAL HISTORY: Past Surgical History:  Procedure Laterality Date  . BIOPSY  01/11/2020   Procedure: BIOPSY;  Surgeon: Perry, John N, MD;  Location: [facility];  Service: Endoscopy;;  . COLONOSCOPY WITH PROPOFOL N/A 01/11/2020   Procedure: COLONOSCOPY WITH PROPOFOL;  Surgeon: Perry, John N, MD;  Location: [facility];  Service: Endoscopy;  Laterality: N/A;  . NO PAST SURGERIES    . POLYPECTOMY  01/11/2020   Procedure: POLYPECTOMY;  Surgeon: Perry, John N, MD;  Location: [facility];  Service: Endoscopy;;  . SUBMUCOSAL TATTOO INJECTION  01/11/2020   Procedure: SUBMUCOSAL TATTOO INJECTION;  Surgeon: Perry, John N, MD;  Location: [facility];  Service: Endoscopy;;    I have reviewed the social history and family history with the  patient and they are unchanged from previous note.  ALLERGIES:  has No Known Allergies.  MEDICATIONS:  Current Outpatient Medications  Medication Sig Dispense Refill  . albuterol (VENTOLIN HFA) 108 (90 Base) MCG/ACT inhaler Inhale 2 puffs into the lungs every 6 (six) hours as needed for wheezing or shortness of breath. 18 g 2  . allopurinol (ZYLOPRIM) 100 MG tablet Take 200 mg by mouth daily.    . amLODipine (NORVASC) 5 MG tablet Take 1 tablet (5 mg total) by mouth daily. For hypertension 30 tablet 3  . atorvastatin (LIPITOR) 40 MG tablet Take 1 tablet (40 mg total) by mouth daily. For hypercholesterolemia 30 tablet 2  . Blood Glucose Monitoring Suppl (TRUE METRIX METER) w/Device KIT Check blood sugar fasting and ant bedtime and record 1 kit 0  . carvedilol (COREG) 12.5 MG tablet Take 1 tablet (12.5 mg total) by mouth 2 (two) times daily with a meal. 60 tablet 2  . furosemide (LASIX) 20 MG tablet Take 3 tablets (60 mg total) by mouth 2 (two) times daily. 180 tablet 2  . gabapentin (NEURONTIN) 100 MG capsule Take 1 capsule (100 mg total) by mouth at bedtime. 30 capsule 0  . glipiZIDE (GLUCOTROL) 5 MG tablet Take 1 tablet (5 mg total) by mouth 2 (two) times daily before a meal. 60 tablet 6  . glucose blood (TRUE METRIX BLOOD GLUCOSE TEST) test strip Use as instructed 200 each 12  . hydrALAZINE (APRESOLINE) 50 MG tablet Take 1 tablet (50 mg total) by mouth every 12 (  twelve) hours. 60 tablet 2  . Misc. Devices (DIGITAL GLASS SCALE) MISC Use scale to weight yourself. Increasing weight may indicate fluid overload 1 each 0  . tamsulosin (FLOMAX) 0.4 MG CAPS capsule Take 1 capsule (0.4 mg total) by mouth daily. 30 capsule 2  . TRUEplus Lancets 28G MISC Check blood sugar fasting and at bedtime 210 each 2  . colchicine 0.6 MG tablet Take 2 tablets (1.2 mg) orally at the onset of a gout attack; may repeat 1 tablet (0.6 mg) in 2 hours if symptoms persist (Patient not taking: Reported on 02/21/2020) 30  tablet 2  . prochlorperazine (COMPAZINE) 10 MG tablet Take 1 tablet (10 mg total) by mouth every 6 (six) hours as needed for nausea. (Patient not taking: Reported on 02/21/2020) 60 tablet 0   No current facility-administered medications for this visit.    PHYSICAL EXAMINATION:  Vitals:   03/06/20 1356  BP: (!) 193/126  Pulse: 82  Resp: 18  Temp: (!) 97.2 F (36.2 C)  SpO2: 100%   Filed Weights   03/06/20 1356  Weight: 243 lb 8 oz (110.5 kg)    GENERAL:alert, no distress and comfortable SKIN: No rash.  Palms without erythema EYES:  sclera clear OROPHARYNX: No thrush or ulcers LUNGS: clear with normal breathing effort HEART: regular rate & rhythm, mild lower extremity edema ABDOMEN:abdomen soft, non-tender and normal bowel sounds NEURO: alert & oriented x 3 with fluent speech PICC without erythema  LABORATORY DATA:  I have reviewed the data as listed CBC Latest Ref Rng & Units 03/06/2020 02/28/2020 01/31/2020  WBC 4.0 - 10.5 K/uL 8.1 8.3 8.6  Hemoglobin 13.0 - 17.0 g/dL 11.2(L) 11.0(L) 11.4(L)  Hematocrit 39 - 52 % 34.7(L) 34.1(L) 35.8(L)  Platelets 150 - 400 K/uL 135(L) 220 216     CMP Latest Ref Rng & Units 03/06/2020 02/28/2020 01/31/2020  Glucose 70 - 99 mg/dL 109(H) 98 197(H)  BUN 6 - 20 mg/dL 49(H) 45(H) 43(H)  Creatinine 0.61 - 1.24 mg/dL 3.11(HH) 3.16(HH) 2.94(H)  Sodium 135 - 145 mmol/L 140 140 139  Potassium 3.5 - 5.1 mmol/L 4.6 4.5 4.4  Chloride 98 - 111 mmol/L 106 109 107  CO2 22 - 32 mmol/L 26 22 25  Calcium 8.9 - 10.3 mg/dL 8.9 8.7(L) 8.9  Total Protein 6.5 - 8.1 g/dL 8.0 7.4 7.7  Total Bilirubin 0.3 - 1.2 mg/dL 0.5 0.5 0.5  Alkaline Phos 38 - 126 U/L 135(H) 143(H) 157(H)  AST 15 - 41 U/L 17 16 17  ALT 0 - 44 U/L 16 15 18      RADIOGRAPHIC STUDIES: I have personally reviewed the radiological images as listed and agreed with the findings in the report. No results found.   ASSESSMENT & PLAN:  1. Rectal cancer-rectal mass at 5-6 cm from the anal  verge on colonoscopy 01/11/2020, biopsy confirmed adenocarcinoma ? CTs 01/11/2020-rectal thickening, small perirectal lymph nodes and external iliac nodes, moderate right pleural effusion ? MRI pelvis 01/27/2020-[id number] tumor at 7.1 cm from the internal anal sphincter, multiple mesorectal nodes, indistinct left pelvic sidewall node and enlarged bilateral external iliac nodes ? PET scan 02/11/2020-hypermetabolic rectosigmoid mass, hypermetabolic mesorectal and sigmoid mesocolon lymph nodes, left external iliac/obturator node with mild hypermetabolism, 18 mm left inguinal node with normal architecture and slight hypermetabolism ? Radiation and infusional 5-FU 02/21/2020 2. Anemia 3. Chronic renal failure 4. Congestive heart failure-severe LVH 5. Diabetes 6. Gout 7. Peripheral neuropathy?   Disposition: Craig West appears stable. He completed 2 weeks of   radiation and infusional 5-FU.  He tolerates treatment well overall.  The CBC and CMP are adequate to proceed with week three 5-FU and continue radiation.  We reviewed expected bowel changes and symptom management. We will follow up with Social work for further financial assistance.   His blood pressure will be repeated in the treatment area.   Continue weekly 5FU, follow up in 2 weeks.    Orders Placed This Encounter  Procedures  . CBC with Differential ([facility] Only)    Standing Status:   Future    Standing Expiration Date:   03/06/2021  . CMP ([facility] only)    Standing Status:   Future    Standing Expiration Date:   03/06/2021   All questions were answered. The patient knows to call the clinic with any problems, questions or concerns. No barriers to learning were detected.      K , NP 03/06/20    

## 2020-03-06 ENCOUNTER — Encounter: Payer: Self-pay | Admitting: Nurse Practitioner

## 2020-03-06 ENCOUNTER — Other Ambulatory Visit: Payer: Self-pay

## 2020-03-06 ENCOUNTER — Encounter: Payer: Self-pay | Admitting: *Deleted

## 2020-03-06 ENCOUNTER — Inpatient Hospital Stay: Payer: Medicaid Other

## 2020-03-06 ENCOUNTER — Inpatient Hospital Stay (HOSPITAL_BASED_OUTPATIENT_CLINIC_OR_DEPARTMENT_OTHER): Payer: Medicaid Other | Admitting: Nurse Practitioner

## 2020-03-06 ENCOUNTER — Ambulatory Visit: Payer: Medicaid Other

## 2020-03-06 ENCOUNTER — Inpatient Hospital Stay: Payer: Medicaid Other | Attending: Oncology

## 2020-03-06 ENCOUNTER — Ambulatory Visit
Admission: RE | Admit: 2020-03-06 | Discharge: 2020-03-06 | Disposition: A | Discharge status: Home / Self Care | Payer: Medicaid Other | Source: Ambulatory Visit | Attending: Radiation Oncology | Admitting: Radiation Oncology

## 2020-03-06 VITALS — BP 193/126 | HR 82 | Temp 97.2°F | Resp 18 | Ht 73.0 in | Wt 243.5 lb

## 2020-03-06 VITALS — BP 180/115

## 2020-03-06 DIAGNOSIS — I110 Hypertensive heart disease with heart failure: Secondary | ICD-10-CM | POA: Insufficient documentation

## 2020-03-06 DIAGNOSIS — D631 Anemia in chronic kidney disease: Secondary | ICD-10-CM | POA: Diagnosis not present

## 2020-03-06 DIAGNOSIS — C20 Malignant neoplasm of rectum: Secondary | ICD-10-CM

## 2020-03-06 DIAGNOSIS — N189 Chronic kidney disease, unspecified: Secondary | ICD-10-CM | POA: Diagnosis not present

## 2020-03-06 DIAGNOSIS — Z5111 Encounter for antineoplastic chemotherapy: Secondary | ICD-10-CM | POA: Insufficient documentation

## 2020-03-06 DIAGNOSIS — Z79899 Other long term (current) drug therapy: Secondary | ICD-10-CM | POA: Insufficient documentation

## 2020-03-06 DIAGNOSIS — I509 Heart failure, unspecified: Secondary | ICD-10-CM | POA: Insufficient documentation

## 2020-03-06 DIAGNOSIS — E119 Type 2 diabetes mellitus without complications: Secondary | ICD-10-CM | POA: Insufficient documentation

## 2020-03-06 LAB — CBC WITH DIFFERENTIAL (CANCER CENTER ONLY)
Abs Immature Granulocytes: 0.05 10*3/uL (ref 0.00–0.07)
Basophils Absolute: 0 10*3/uL (ref 0.0–0.1)
Basophils Relative: 0 %
Eosinophils Absolute: 0.2 10*3/uL (ref 0.0–0.5)
Eosinophils Relative: 2 %
HCT: 34.7 % — ABNORMAL LOW (ref 39.0–52.0)
Hemoglobin: 11.2 g/dL — ABNORMAL LOW (ref 13.0–17.0)
Immature Granulocytes: 1 %
Lymphocytes Relative: 14 %
Lymphs Abs: 1.1 10*3/uL (ref 0.7–4.0)
MCH: 28.7 pg (ref 26.0–34.0)
MCHC: 32.3 g/dL (ref 30.0–36.0)
MCV: 89 fL (ref 80.0–100.0)
Monocytes Absolute: 0.7 10*3/uL (ref 0.1–1.0)
Monocytes Relative: 8 %
Neutro Abs: 6.1 10*3/uL (ref 1.7–7.7)
Neutrophils Relative %: 75 %
Platelet Count: 135 10*3/uL — ABNORMAL LOW (ref 150–400)
RBC: 3.9 MIL/uL — ABNORMAL LOW (ref 4.22–5.81)
RDW: 15.9 % — ABNORMAL HIGH (ref 11.5–15.5)
WBC Count: 8.1 10*3/uL (ref 4.0–10.5)
nRBC: 0 % (ref 0.0–0.2)

## 2020-03-06 LAB — CMP (CANCER CENTER ONLY)
ALT: 16 U/L (ref 0–44)
AST: 17 U/L (ref 15–41)
Albumin: 3.5 g/dL (ref 3.5–5.0)
Alkaline Phosphatase: 135 U/L — ABNORMAL HIGH (ref 38–126)
Anion gap: 8 (ref 5–15)
BUN: 49 mg/dL — ABNORMAL HIGH (ref 6–20)
CO2: 26 mmol/L (ref 22–32)
Calcium: 8.9 mg/dL (ref 8.9–10.3)
Chloride: 106 mmol/L (ref 98–111)
Creatinine: 3.11 mg/dL (ref 0.61–1.24)
GFR, Estimated: 23 mL/min — ABNORMAL LOW (ref >60)
Glucose, Bld: 109 mg/dL — ABNORMAL HIGH (ref 70–99)
Potassium: 4.6 mmol/L (ref 3.5–5.1)
Sodium: 140 mmol/L (ref 135–145)
Total Bilirubin: 0.5 mg/dL (ref 0.3–1.2)
Total Protein: 8 g/dL (ref 6.5–8.1)

## 2020-03-06 MED ORDER — SODIUM CHLORIDE 0.9% FLUSH
10.0000 mL | INTRAVENOUS | Status: DC | PRN
  Filled 2020-03-06: qty 10

## 2020-03-06 MED ORDER — SODIUM CHLORIDE 0.9 % IV SOLN
225.0000 mg/m2/d | INTRAVENOUS | Status: DC
  Administered 2020-03-06: 3850 mg via INTRAVENOUS
  Filled 2020-03-06: qty 77

## 2020-03-06 NOTE — Progress Notes (Signed)
Patient completed infusion without incident. In no visible distress at time of discharge. Ambulated out of cancer center. AVS provided.  

## 2020-03-06 NOTE — Progress Notes (Signed)
[place] Work  Patient was referred to Clinical Social Work for mediation assistance.  Patient reported need for financial assistance for medications to medical oncology.  According to treatment team the medication prescribed by [facility] has not been needed; therefore is not the cause of the concern.  Patient has used all of his [employer] executive and was given his last [employer] on 02/28/20.  Patient has applied for Medicaid through [employer] and is still pending.  CSW will contact [employer] to get any additional updates.  Unfortunately resources are limited and patient has exhausted all grant options through [facility].    Craig [facility], MSW, LCSW, OSW-C Clinical Social Worker [facility] [phone]

## 2020-03-06 NOTE — Patient Instructions (Signed)
[facility] Discharge Instructions for Patients Receiving Chemotherapy  Today you received the following chemotherapy agents: fluorouracil.  To help prevent nausea and vomiting after your treatment, we encourage you to take your nausea medication as directed.  If you develop nausea and vomiting that is not controlled by your nausea medication, call the clinic.   BELOW ARE SYMPTOMS THAT SHOULD BE REPORTED IMMEDIATELY:  *FEVER GREATER THAN 100.5 F  *CHILLS WITH OR WITHOUT FEVER  NAUSEA AND VOMITING THAT IS NOT CONTROLLED WITH YOUR NAUSEA MEDICATION  *UNUSUAL SHORTNESS OF BREATH  *UNUSUAL BRUISING OR BLEEDING  TENDERNESS IN MOUTH AND THROAT WITH OR WITHOUT PRESENCE OF ULCERS  *URINARY PROBLEMS  *BOWEL PROBLEMS  UNUSUAL RASH Items with * indicate a potential emergency and should be followed up as soon as possible.  Feel free to call the clinic should you have any questions or concerns. The clinic phone number is [phone].  Please show the CHEMO ALERT CARD at check-in to the Emergency Department and triage nurse.   

## 2020-03-06 NOTE — Progress Notes (Signed)
BP rechecked manually. Lanora Manis NP aware of BP.  Ok to treat w/ elevated BP per Cira Rue NP.

## 2020-03-07 ENCOUNTER — Ambulatory Visit
Admission: RE | Admit: 2020-03-07 | Discharge: 2020-03-07 | Disposition: A | Discharge status: Home / Self Care | Payer: Medicaid Other | Source: Ambulatory Visit | Attending: Radiation Oncology | Admitting: Radiation Oncology

## 2020-03-07 ENCOUNTER — Ambulatory Visit: Payer: Medicaid Other

## 2020-03-07 DIAGNOSIS — C20 Malignant neoplasm of rectum: Secondary | ICD-10-CM | POA: Diagnosis not present

## 2020-03-08 ENCOUNTER — Ambulatory Visit: Payer: Medicaid Other

## 2020-03-08 ENCOUNTER — Other Ambulatory Visit: Payer: Self-pay

## 2020-03-08 ENCOUNTER — Ambulatory Visit
Admission: RE | Admit: 2020-03-08 | Discharge: 2020-03-08 | Disposition: A | Discharge status: Home / Self Care | Payer: Medicaid Other | Source: Ambulatory Visit | Attending: Radiation Oncology | Admitting: Radiation Oncology

## 2020-03-08 DIAGNOSIS — C20 Malignant neoplasm of rectum: Secondary | ICD-10-CM | POA: Diagnosis not present

## 2020-03-09 ENCOUNTER — Ambulatory Visit
Admission: RE | Admit: 2020-03-09 | Discharge: 2020-03-09 | Disposition: A | Discharge status: Home / Self Care | Payer: Medicaid Other | Source: Ambulatory Visit | Attending: Radiation Oncology | Admitting: Radiation Oncology

## 2020-03-09 ENCOUNTER — Ambulatory Visit: Payer: Medicaid Other

## 2020-03-09 DIAGNOSIS — C20 Malignant neoplasm of rectum: Secondary | ICD-10-CM | POA: Diagnosis not present

## 2020-03-10 ENCOUNTER — Ambulatory Visit
Admission: RE | Admit: 2020-03-10 | Discharge: 2020-03-10 | Disposition: A | Discharge status: Home / Self Care | Payer: Medicaid Other | Source: Ambulatory Visit | Attending: Radiation Oncology | Admitting: Radiation Oncology

## 2020-03-10 ENCOUNTER — Ambulatory Visit: Payer: Medicaid Other

## 2020-03-10 DIAGNOSIS — C20 Malignant neoplasm of rectum: Secondary | ICD-10-CM | POA: Diagnosis not present

## 2020-03-12 ENCOUNTER — Other Ambulatory Visit: Payer: Self-pay | Admitting: Oncology

## 2020-03-13 ENCOUNTER — Other Ambulatory Visit: Payer: Self-pay

## 2020-03-13 ENCOUNTER — Inpatient Hospital Stay: Payer: Medicaid Other

## 2020-03-13 ENCOUNTER — Ambulatory Visit
Admission: RE | Admit: 2020-03-13 | Discharge: 2020-03-13 | Disposition: A | Discharge status: Home / Self Care | Payer: Medicaid Other | Source: Ambulatory Visit | Attending: Radiation Oncology | Admitting: Radiation Oncology

## 2020-03-13 ENCOUNTER — Ambulatory Visit: Payer: Medicaid Other

## 2020-03-13 VITALS — BP 180/114 | HR 89 | Temp 98.2°F | Resp 20 | Wt 246.0 lb

## 2020-03-13 DIAGNOSIS — Z5111 Encounter for antineoplastic chemotherapy: Secondary | ICD-10-CM | POA: Diagnosis not present

## 2020-03-13 DIAGNOSIS — C20 Malignant neoplasm of rectum: Secondary | ICD-10-CM | POA: Diagnosis not present

## 2020-03-13 LAB — CBC WITH DIFFERENTIAL (CANCER CENTER ONLY)
Abs Immature Granulocytes: 0.04 10*3/uL (ref 0.00–0.07)
Basophils Absolute: 0 10*3/uL (ref 0.0–0.1)
Basophils Relative: 0 %
Eosinophils Absolute: 0.2 10*3/uL (ref 0.0–0.5)
Eosinophils Relative: 2 %
HCT: 35.5 % — ABNORMAL LOW (ref 39.0–52.0)
Hemoglobin: 11.4 g/dL — ABNORMAL LOW (ref 13.0–17.0)
Immature Granulocytes: 1 %
Lymphocytes Relative: 7 %
Lymphs Abs: 0.6 10*3/uL — ABNORMAL LOW (ref 0.7–4.0)
MCH: 29 pg (ref 26.0–34.0)
MCHC: 32.1 g/dL (ref 30.0–36.0)
MCV: 90.3 fL (ref 80.0–100.0)
Monocytes Absolute: 0.9 10*3/uL (ref 0.1–1.0)
Monocytes Relative: 10 %
Neutro Abs: 6.9 10*3/uL (ref 1.7–7.7)
Neutrophils Relative %: 80 %
Platelet Count: 129 10*3/uL — ABNORMAL LOW (ref 150–400)
RBC: 3.93 MIL/uL — ABNORMAL LOW (ref 4.22–5.81)
RDW: 16.8 % — ABNORMAL HIGH (ref 11.5–15.5)
WBC Count: 8.6 10*3/uL (ref 4.0–10.5)
nRBC: 0 % (ref 0.0–0.2)

## 2020-03-13 LAB — CMP (CANCER CENTER ONLY)
ALT: 20 U/L (ref 0–44)
AST: 20 U/L (ref 15–41)
Albumin: 3.2 g/dL — ABNORMAL LOW (ref 3.5–5.0)
Alkaline Phosphatase: 133 U/L — ABNORMAL HIGH (ref 38–126)
Anion gap: 6 (ref 5–15)
BUN: 36 mg/dL — ABNORMAL HIGH (ref 6–20)
CO2: 26 mmol/L (ref 22–32)
Calcium: 8.7 mg/dL — ABNORMAL LOW (ref 8.9–10.3)
Chloride: 109 mmol/L (ref 98–111)
Creatinine: 3.23 mg/dL (ref 0.61–1.24)
GFR, Estimated: 22 mL/min — ABNORMAL LOW (ref >60)
Glucose, Bld: 119 mg/dL — ABNORMAL HIGH (ref 70–99)
Potassium: 4.3 mmol/L (ref 3.5–5.1)
Sodium: 141 mmol/L (ref 135–145)
Total Bilirubin: 0.5 mg/dL (ref 0.3–1.2)
Total Protein: 7.6 g/dL (ref 6.5–8.1)

## 2020-03-13 MED ORDER — SODIUM CHLORIDE 0.9 % IV SOLN
225.0000 mg/m2/d | INTRAVENOUS | Status: DC
  Administered 2020-03-13: 14:00:00 3850 mg via INTRAVENOUS
  Filled 2020-03-13: qty 77

## 2020-03-13 MED ORDER — SODIUM CHLORIDE 0.9% FLUSH
10.0000 mL | INTRAVENOUS | Status: DC | PRN
  Filled 2020-03-13: qty 10

## 2020-03-13 NOTE — Progress Notes (Signed)
Pt. stable for discharge. Left via ambulation, no respiratory distress noted. 

## 2020-03-13 NOTE — Patient Instructions (Signed)
PICC Home Care Guide  A peripherally inserted central catheter (PICC) is a form of IV access that allows medicines and IV fluids to be quickly distributed throughout the body. The PICC is a long, thin, flexible tube (catheter) that is inserted into a vein in the upper arm. The catheter ends in a large vein in the chest (superior vena cava, or SVC). After the PICC is inserted, a chest X-ray may be done to make sure that it is in the correct place. A PICC may be placed for different reasons, such as:  To give medicines and liquid nutrition.  To give IV fluids and blood products.  If there is trouble placing a peripheral intravenous (PIV) catheter. If taken care of properly, a PICC can remain in place for several months. Having a PICC can also allow a person to go home from the hospital sooner. Medicine and PICC care can be managed at home by a family member, caregiver, or home health care team. What are the risks? Generally, having a PICC is safe. However, problems may occur, including:  A blood clot (thrombus) forming in or at the tip of the PICC.  A blood clot forming in a vein (deep vein thrombosis) or traveling to the lung (pulmonary embolism).  Inflammation of the vein (phlebitis) in which the PICC is placed.  Infection. Central line associated blood stream infection (CLABSI) is a serious infection that often requires hospitalization.  PICC movement (malposition). The PICC tip may move from its original position due to excessive physical activity, forceful coughing, sneezing, or vomiting.  A break or cut in the PICC. It is important not to use scissors near the PICC.  Nerve or tendon irritation or injury during PICC insertion. How to take care of your PICC Preventing problems  You and any caregivers should wash your hands often with soap. Wash hands: ? Before touching the PICC line or the infusion device. ? Before changing a bandage (dressing).  Flush the PICC as told by your  health care provider. Let your health care provider know right away if the PICC is hard to flush or does not flush. Do not use force to flush the PICC.  Do not use a syringe that is less than 10 mL to flush the PICC.  Avoid blood pressure checks on the arm in which the PICC is placed.  Never pull or tug on the PICC.  Do not take the PICC out yourself. Only a trained clinical professional should remove the PICC.  Use clean and sterile supplies only. Keep the supplies in a dry place. Do not reuse needles, syringes, or any other supplies. Doing that can lead to infection.  Keep pets and children away from your PICC line.  Check the PICC insertion site every day for signs of infection. Check for: ? Leakage. ? Redness, swelling, or pain. ? Fluid or blood. ? Warmth. ? Pus or a bad smell. PICC dressing care  Keep your PICC bandage (dressing) clean and dry to prevent infection.  Do not take baths, swim, or use a hot tub until your health care provider approves. Ask your health care provider if you can take showers. You may only be allowed to take sponge baths for bathing. When you are allowed to shower: ? Ask your health care provider to teach you how to wrap the PICC line. ? Cover the PICC line with clear plastic wrap and tape to keep it dry while showering.  Follow instructions from your health care provider   about how to take care of your insertion site and dressing. Make sure you: ? Wash your hands with soap and water before you change your bandage (dressing). If soap and water are not available, use hand sanitizer. ? Change your dressing as told by your health care provider. ? Leave stitches (sutures), skin glue, or adhesive strips in place. These skin closures may need to stay in place for 2 weeks or longer. If adhesive strip edges start to loosen and curl up, you may trim the loose edges. Do not remove adhesive strips completely unless your health care provider tells you to do  that.  Change your PICC dressing if it becomes loose or wet. General instructions   Carry your PICC identification card or wear a medical alert bracelet at all times.  Keep the tube clamped at all times, unless it is being used.  Carry a smooth-edge clamp with you at all times to place on the tube if it breaks.  Do not use scissors or sharp objects near the tube.  You may bend your arm and move it freely. If your PICC is near or at the bend of your elbow, avoid activity with repeated motion at the elbow.  Avoid lifting heavy objects as told by your health care provider.  Keep all follow-up visits as told by your health care provider. This is important. Disposal of supplies  Throw away any syringes in a disposal container that is meant for sharp items (sharps container). You can buy a sharps container from a pharmacy, or you can make one by using an empty hard plastic bottle with a cover.  Place any used dressings or infusion bags into a plastic bag. Throw that bag in the trash. Contact a health care provider if:  You have pain in your arm, ear, face, or teeth.  You have a fever or chills.  You have redness, swelling, or pain around the insertion site.  You have fluid or blood coming from the insertion site.  Your insertion site feels warm to the touch.  You have pus or a bad smell coming from the insertion site.  Your skin feels hard and raised around the insertion site. Get help right away if:  Your PICC is accidentally pulled all the way out. If this happens, cover the insertion site with a bandage or gauze dressing. Do not throw the PICC away. Your health care provider will need to check it.  Your PICC was tugged or pulled and has partially come out. Do not  push the PICC back in.  You cannot flush the PICC, it is hard to flush, or the PICC leaks around the insertion site when it is flushed.  You hear a "flushing" sound when the PICC is flushed.  You feel your  heart racing or skipping beats.  There is a hole or tear in the PICC.  You have swelling in the arm in which the PICC was inserted.  You have a red streak going up your arm from where the PICC was inserted. Summary  A peripherally inserted central catheter (PICC) is a long, thin, flexible tube (catheter) that is inserted into a vein in the upper arm.  The PICC is inserted using a sterile technique by a specially trained nurse or physician. Only a trained clinical professional should remove it.  Keep your PICC identification card with you at all times.  Avoid blood pressure checks on the arm in which the PICC is placed.  If cared for   properly, a PICC can remain in place for several months. Having a PICC can also allow a person to go home from the hospital sooner. This information is not intended to replace advice given to you by your health care provider. Make sure you discuss any questions you have with your health care provider. Document Revised: 02/28/2017 Document Reviewed: 04/20/2016 Elsevier Patient Education  2020 Elsevier Inc.  

## 2020-03-13 NOTE — Progress Notes (Signed)
Dr.Sherrill made aware by this nurse of pt Bp of 195/125 Per provider pt is ok to tx.

## 2020-03-13 NOTE — Patient Instructions (Signed)
[facility] Discharge Instructions for Patients Receiving Chemotherapy  Today you received the following chemotherapy agent: Fluorouracil  To help prevent nausea and vomiting after your treatment, we encourage you to take your nausea medication as directed by your MD.   If you develop nausea and vomiting that is not controlled by your nausea medication, call the clinic.   BELOW ARE SYMPTOMS THAT SHOULD BE REPORTED IMMEDIATELY:  *FEVER GREATER THAN 100.5 F  *CHILLS WITH OR WITHOUT FEVER  NAUSEA AND VOMITING THAT IS NOT CONTROLLED WITH YOUR NAUSEA MEDICATION  *UNUSUAL SHORTNESS OF BREATH  *UNUSUAL BRUISING OR BLEEDING  TENDERNESS IN MOUTH AND THROAT WITH OR WITHOUT PRESENCE OF ULCERS  *URINARY PROBLEMS  *BOWEL PROBLEMS  UNUSUAL RASH Items with * indicate a potential emergency and should be followed up as soon as possible.  Feel free to call the clinic should you have any questions or concerns. The clinic phone number is [phone].  Please show the CHEMO ALERT CARD at check-in to the Emergency Department and triage nurse.  [facility] Discharge Instructions for Patients receiving Home Portable Chemo Pump    **The bag should finish at 46 hours, 96 hours or 7 days. For example, if your pump is scheduled for 46 hours and it was put on at 4pm, it should finish at 2 pm the day it is scheduled to come off regardless of your appointment time.    Estimated time to finish   _________________________ (Have your nurse fill in)     ** if the display on your pump reads "Low Volume" and it is beeping, take the batteries out of the pump and come to the cancer center for it to be taken off.   **If the pump alarms go off prior to the pump reading "Low Volume" then call the 1-800-315-3287 and someone can assist you.  **If the plunger comes out and the bag fluid is running out, please use your chemo spill kit to clean up the spill. Do not use paper towels or  other house hold products.  ** If you have problems or questions regarding your pump, please call either the [phone] or the cancer center Monday-Friday 8:00am-4:30pm at [phone] and we will assist you.  If you are unable to get assistance then go to [facility] Emergency Room, ask the staff to contact the IV team for assistance.    

## 2020-03-13 NOTE — Progress Notes (Signed)
Per Dr. Benay Spice: OK to treat today w/creatinine 3.23

## 2020-03-14 ENCOUNTER — Telehealth: Payer: Self-pay

## 2020-03-14 ENCOUNTER — Ambulatory Visit
Admission: RE | Admit: 2020-03-14 | Discharge: 2020-03-14 | Disposition: A | Discharge status: Home / Self Care | Payer: Medicaid Other | Source: Ambulatory Visit | Attending: Radiation Oncology | Admitting: Radiation Oncology

## 2020-03-14 ENCOUNTER — Ambulatory Visit: Payer: Medicaid Other

## 2020-03-14 DIAGNOSIS — C20 Malignant neoplasm of rectum: Secondary | ICD-10-CM | POA: Diagnosis not present

## 2020-03-14 MED FILL — CARVEDILOL 6.25 MG TABLET: 6.25 | 30 days supply | Qty: 120 | Fill #1

## 2020-03-14 MED FILL — ISOSORBIDE MN ER 60 MG TAB: 60 | 30 days supply | Qty: 30 | Fill #1

## 2020-03-14 MED FILL — TAMSULOSIN HCL 0.4 MG CAP: 0.4 | 30 days supply | Qty: 30 | Fill #1

## 2020-03-14 MED FILL — glipiZIDE 10 MG TABS: 10 | 30 days supply | Qty: 60 | Fill #1

## 2020-03-14 MED FILL — hydrALAZINE HCL 50 MG TABS: 50 | 30 days supply | Qty: 60 | Fill #1

## 2020-03-14 MED FILL — GABAPENTIN 300 MG CAPSULE: 300 | 30 days supply | Qty: 60 | Fill #1

## 2020-03-14 MED FILL — FUROSEMIDE 80 MG TAB: 80 | 30 days supply | Qty: 15 | Fill #1

## 2020-03-14 MED FILL — ATORVASTATIN CALCIUM 40 MG: 40 | 30 days supply | Qty: 30 | Fill #1

## 2020-03-14 MED FILL — ?ALLOPURINOL 100MG TABLET: 100 | 30 days supply | Qty: 60 | Fill #1

## 2020-03-14 MED FILL — AMLODIPINE BESYLATE 5 MG TA: 5 | 30 days supply | Qty: 30 | Fill #2

## 2020-03-14 NOTE — Telephone Encounter (Signed)
Attempted to contact the patient # [phone] to discuss his concerns about medication costs but his voicemail was not set up.  It appears he has [employer] effective 03/01/2020 which should provide medication coverage.  If he is having his medications filled at [facility] and he is not able to afford the medication co-pays, he can charge most co-pays to an account at [facility] and pay off when he is able.   Manuela Schwartz, If you speak with him , please have him call me at [facility] # [phone]

## 2020-03-15 ENCOUNTER — Ambulatory Visit
Admission: RE | Admit: 2020-03-15 | Discharge: 2020-03-15 | Disposition: A | Discharge status: Home / Self Care | Payer: Medicaid Other | Source: Ambulatory Visit | Attending: Radiation Oncology | Admitting: Radiation Oncology

## 2020-03-15 ENCOUNTER — Ambulatory Visit: Payer: Medicaid Other

## 2020-03-15 DIAGNOSIS — C20 Malignant neoplasm of rectum: Secondary | ICD-10-CM | POA: Diagnosis not present

## 2020-03-16 ENCOUNTER — Ambulatory Visit: Payer: Medicaid Other

## 2020-03-16 ENCOUNTER — Other Ambulatory Visit: Payer: Self-pay

## 2020-03-16 ENCOUNTER — Ambulatory Visit
Admission: RE | Admit: 2020-03-16 | Discharge: 2020-03-16 | Disposition: A | Discharge status: Home / Self Care | Payer: Medicaid Other | Source: Ambulatory Visit | Attending: Radiation Oncology | Admitting: Radiation Oncology

## 2020-03-16 DIAGNOSIS — C20 Malignant neoplasm of rectum: Secondary | ICD-10-CM | POA: Diagnosis not present

## 2020-03-17 ENCOUNTER — Ambulatory Visit
Admission: RE | Admit: 2020-03-17 | Discharge: 2020-03-17 | Disposition: A | Discharge status: Home / Self Care | Payer: Medicaid Other | Source: Ambulatory Visit | Attending: Radiation Oncology | Admitting: Radiation Oncology

## 2020-03-17 ENCOUNTER — Other Ambulatory Visit: Payer: Self-pay | Admitting: *Deleted

## 2020-03-17 ENCOUNTER — Ambulatory Visit: Payer: Medicaid Other

## 2020-03-17 DIAGNOSIS — C20 Malignant neoplasm of rectum: Secondary | ICD-10-CM

## 2020-03-19 ENCOUNTER — Other Ambulatory Visit: Payer: Self-pay | Admitting: Oncology

## 2020-03-20 ENCOUNTER — Ambulatory Visit
Admission: RE | Admit: 2020-03-20 | Discharge: 2020-03-20 | Disposition: A | Discharge status: Home / Self Care | Payer: Medicaid Other | Source: Ambulatory Visit | Attending: Radiation Oncology | Admitting: Radiation Oncology

## 2020-03-20 ENCOUNTER — Inpatient Hospital Stay: Payer: Medicaid Other

## 2020-03-20 ENCOUNTER — Other Ambulatory Visit: Payer: Self-pay

## 2020-03-20 ENCOUNTER — Inpatient Hospital Stay (HOSPITAL_BASED_OUTPATIENT_CLINIC_OR_DEPARTMENT_OTHER): Payer: Medicaid Other | Admitting: Oncology

## 2020-03-20 ENCOUNTER — Telehealth: Payer: Self-pay

## 2020-03-20 VITALS — BP 133/98 | HR 74 | Temp 97.6°F | Resp 18 | Ht 73.0 in | Wt 247.0 lb

## 2020-03-20 DIAGNOSIS — Z95828 Presence of other vascular implants and grafts: Secondary | ICD-10-CM

## 2020-03-20 DIAGNOSIS — C20 Malignant neoplasm of rectum: Secondary | ICD-10-CM

## 2020-03-20 DIAGNOSIS — Z5111 Encounter for antineoplastic chemotherapy: Secondary | ICD-10-CM | POA: Diagnosis not present

## 2020-03-20 LAB — CBC WITH DIFFERENTIAL (CANCER CENTER ONLY)
Abs Immature Granulocytes: 0.05 10*3/uL (ref 0.00–0.07)
Basophils Absolute: 0 10*3/uL (ref 0.0–0.1)
Basophils Relative: 0 %
Eosinophils Absolute: 0.2 10*3/uL (ref 0.0–0.5)
Eosinophils Relative: 2 %
HCT: 32.6 % — ABNORMAL LOW (ref 39.0–52.0)
Hemoglobin: 10.7 g/dL — ABNORMAL LOW (ref 13.0–17.0)
Immature Granulocytes: 1 %
Lymphocytes Relative: 6 %
Lymphs Abs: 0.5 10*3/uL — ABNORMAL LOW (ref 0.7–4.0)
MCH: 29.7 pg (ref 26.0–34.0)
MCHC: 32.8 g/dL (ref 30.0–36.0)
MCV: 90.6 fL (ref 80.0–100.0)
Monocytes Absolute: 0.8 10*3/uL (ref 0.1–1.0)
Monocytes Relative: 9 %
Neutro Abs: 7 10*3/uL (ref 1.7–7.7)
Neutrophils Relative %: 82 %
Platelet Count: 179 10*3/uL (ref 150–400)
RBC: 3.6 MIL/uL — ABNORMAL LOW (ref 4.22–5.81)
RDW: 18.1 % — ABNORMAL HIGH (ref 11.5–15.5)
WBC Count: 8.6 10*3/uL (ref 4.0–10.5)
nRBC: 0 % (ref 0.0–0.2)

## 2020-03-20 LAB — CMP (CANCER CENTER ONLY)
ALT: 15 U/L (ref 0–44)
AST: 13 U/L — ABNORMAL LOW (ref 15–41)
Albumin: 3.2 g/dL — ABNORMAL LOW (ref 3.5–5.0)
Alkaline Phosphatase: 122 U/L (ref 38–126)
Anion gap: 7 (ref 5–15)
BUN: 45 mg/dL — ABNORMAL HIGH (ref 6–20)
CO2: 24 mmol/L (ref 22–32)
Calcium: 8.4 mg/dL — ABNORMAL LOW (ref 8.9–10.3)
Chloride: 109 mmol/L (ref 98–111)
Creatinine: 3.53 mg/dL (ref 0.61–1.24)
GFR, Estimated: 19 mL/min — ABNORMAL LOW (ref >60)
Glucose, Bld: 103 mg/dL — ABNORMAL HIGH (ref 70–99)
Potassium: 4.2 mmol/L (ref 3.5–5.1)
Sodium: 140 mmol/L (ref 135–145)
Total Bilirubin: 0.5 mg/dL (ref 0.3–1.2)
Total Protein: 7.3 g/dL (ref 6.5–8.1)

## 2020-03-20 MED ORDER — FLUOROURACIL CHEMO INJECTION 5 GM/100ML
225.0000 mg/m2/d | INTRAVENOUS | Status: DC
  Administered 2020-03-20: 12:00:00 3850 mg via INTRAVENOUS
  Filled 2020-03-20: qty 77

## 2020-03-20 MED ORDER — SODIUM CHLORIDE 0.9% FLUSH
10.0000 mL | INTRAVENOUS | Status: DC | PRN
  Filled 2020-03-20: qty 10

## 2020-03-20 MED ORDER — SODIUM CHLORIDE 0.9% FLUSH
10.0000 mL | INTRAVENOUS | Status: DC | PRN
  Administered 2020-03-20: 10:00:00 10 mL via INTRAVENOUS
  Filled 2020-03-20: qty 10

## 2020-03-20 NOTE — Telephone Encounter (Signed)
CRITICAL VALUE STICKER  CRITICAL VALUE: Creatinine = 3.53  RECEIVER (on-site recipient of call): Yetta Glassman, CMA  DATE & TIME NOTIFIED: 03/20/20 at 11:00am  MESSENGER (representative from lab): Heather  MD NOTIFIED: Dr. Benay Spice  TIME OF NOTIFICATION: 03/20/20 at 11:03am  RESPONSE: Notification given to Colletta Maryland, LPN for follow-up with the provider.

## 2020-03-20 NOTE — Progress Notes (Signed)
Per Dr. Benay Spice: OK to treat w/creatinine 3.53

## 2020-03-20 NOTE — Patient Instructions (Addendum)
[facility] Discharge Instructions for Patients Receiving Chemotherapy  Today you received the following chemotherapy agent: Fluorouracil  To help prevent nausea and vomiting after your treatment, we encourage you to take your nausea medication as directed by your MD.   If you develop nausea and vomiting that is not controlled by your nausea medication, call the clinic.   BELOW ARE SYMPTOMS THAT SHOULD BE REPORTED IMMEDIATELY:  *FEVER GREATER THAN 100.5 F  *CHILLS WITH OR WITHOUT FEVER  NAUSEA AND VOMITING THAT IS NOT CONTROLLED WITH YOUR NAUSEA MEDICATION  *UNUSUAL SHORTNESS OF BREATH  *UNUSUAL BRUISING OR BLEEDING  TENDERNESS IN MOUTH AND THROAT WITH OR WITHOUT PRESENCE OF ULCERS  *URINARY PROBLEMS  *BOWEL PROBLEMS  UNUSUAL RASH Items with * indicate a potential emergency and should be followed up as soon as possible.  Feel free to call the clinic should you have any questions or concerns. The clinic phone number is [phone].  Please show the CHEMO ALERT CARD at check-in to the Emergency Department and triage nurse.  [facility] Discharge Instructions for Patients receiving Home Portable Chemo Pump    **The bag should finish at 46 hours, 96 hours or 7 days. For example, if your pump is scheduled for 46 hours and it was put on at 4pm, it should finish at 2 pm the day it is scheduled to come off regardless of your appointment time.    Estimated time to finish   _________________________ (Have your nurse fill in)     ** if the display on your pump reads "Low Volume" and it is beeping, take the batteries out of the pump and come to the cancer center for it to be taken off.   **If the pump alarms go off prior to the pump reading "Low Volume" then call the [phone] and someone can assist you.  **If the plunger comes out and the bag fluid is running out, please use your chemo spill kit to clean up the spill. Do not use paper towels or  other house hold products.  ** If you have problems or questions regarding your pump, please call either the [phone] or the cancer center Monday-Friday 8:00am-4:30pm at [phone] and we will assist you.  If you are unable to get assistance then go to [facility] Emergency Room, ask the staff to contact the IV team for assistance.    

## 2020-03-20 NOTE — Progress Notes (Signed)
  [facility] OFFICE PROGRESS NOTE   Diagnosis: Rectal cancer  INTERVAL HISTORY:   Craig West returns as scheduled.  He continues infusional 5-FU and radiation.  No mouth sores, nausea, or diarrhea.  He had an episode of chest pain yesterday while at the barber.  The pain was in the low sternal area and lasted 15 minutes.  No associated symptoms.  This pain spontaneously resolved.  He has developed "sores "at the rectum.  Objective:  Vital signs in last 24 hours:  Blood pressure (!) 133/98, pulse 74, temperature 97.6 F (36.4 C), temperature source Tympanic, resp. rate 18, height 6\' 1"  (1.854 m), weight 247 lb (112 kg), SpO2 99 %.    HEENT: No thrush or ulcers Resp: Scattered end inspiratory bronchial sounds, no respiratory distress Cardio: Regular rate and rhythm GI: No hepatosplenomegaly, nontender Vascular: No leg edema Rectal: Hyperpigmentation, skin thickening at the gluteus and perineum.  Area of superficial ulceration at the lower gluteal fold Skin: Palms without erythema Musculoskeletal: No sternal tenderness  Portacath/PICC-without erythema  Lab Results:  Lab Results  Component Value Date   WBC 8.6 03/20/2020   HGB 10.7 (L) 03/20/2020   HCT 32.6 (L) 03/20/2020   MCV 90.6 03/20/2020   PLT 179 03/20/2020   NEUTROABS 7.0 03/20/2020    CMP  Lab Results  Component Value Date   NA 141 03/13/2020   K 4.3 03/13/2020   CL 109 03/13/2020   CO2 26 03/13/2020   GLUCOSE 119 (H) 03/13/2020   BUN 36 (H) 03/13/2020   CREATININE 3.23 (HH) 03/13/2020   CALCIUM 8.7 (L) 03/13/2020   PROT 7.6 03/13/2020   ALBUMIN 3.2 (L) 03/13/2020   AST 20 03/13/2020   ALT 20 03/13/2020   ALKPHOS 133 (H) 03/13/2020   BILITOT 0.5 03/13/2020   GFRNONAA 22 (L) 03/13/2020   GFRAA 15 (L) 01/03/2020    Lab Results  Component Value Date   CEA1 2.8 01/11/2020    Medications: I have reviewed the patient's current medications.   Assessment/Plan: 1. Rectal cancer-rectal  mass at 5-6 cm from the anal verge on colonoscopy 01/11/2020, biopsy confirmed adenocarcinoma ? CTs 01/11/2020-rectal thickening, small perirectal lymph nodes and external iliac nodes, moderate right pleural effusion ? MRI pelvis 01/27/2020-[id number] tumor at 7.1 cm from the internal anal sphincter, multiple mesorectal nodes, indistinct left pelvic sidewall node and enlarged bilateral external iliac nodes ? PET scan 02/11/2020-hypermetabolic rectosigmoid mass, hypermetabolic mesorectal and sigmoid mesocolon lymph nodes, left external iliac/obturator node with mild hypermetabolism, 18 mm left inguinal node with normal architecture and slight hypermetabolism ? Radiation and infusional 5-FU 02/21/2020 2. Anemia 3. Chronic renal failure 4. Congestive heart failure-severe LVH 5. Diabetes 6. Gout 7. Peripheral neuropathy?    Disposition: [name] appears to be tolerating the infusional 5-FU well.  The etiology of the chest pain yesterday is unclear.  I doubt the pain was related to his heart.  He will call for recurrent pain.  He is developing breakdown at the sacrum.  I recommended he avoid pressure on the sacrum.  We will contact radiation oncology to recommend topical therapy.  He will continue infusional 5-FU.  He will return for an office visit in 2 weeks.  We will decide on surgery versus neoadjuvant chemotherapy at the completion of radiation.  Betsy Coder, MD  03/20/2020  11:00 AM

## 2020-03-21 ENCOUNTER — Ambulatory Visit
Admission: RE | Admit: 2020-03-21 | Discharge: 2020-03-21 | Disposition: A | Discharge status: Home / Self Care | Payer: Medicaid Other | Source: Ambulatory Visit | Attending: Radiation Oncology | Admitting: Radiation Oncology

## 2020-03-21 ENCOUNTER — Encounter: Payer: Self-pay | Admitting: Radiation Oncology

## 2020-03-21 ENCOUNTER — Telehealth: Payer: Self-pay | Admitting: Oncology

## 2020-03-21 DIAGNOSIS — C20 Malignant neoplasm of rectum: Secondary | ICD-10-CM | POA: Diagnosis not present

## 2020-03-21 MED ORDER — SILVER SULFADIAZINE 1 % EX CREA: TOPICAL_CREAM | Freq: Once | CUTANEOUS | Status: AC

## 2020-03-21 NOTE — Telephone Encounter (Signed)
Rescheduled appt per 12/20 sch msg - pt is aware of appt change.

## 2020-03-21 NOTE — Progress Notes (Signed)
The patient was seen today he has had 20 out of 28 fractions to the rectum, he started to develop some skin irritation in the upper buttock region.  On examination today, there is mild fissuring along with hypertrophic tissue at the upper limits of the gluteal cleft.  After confirming the patient did not have any allergies, Silvadene was applied and provided to the patient to use twice daily at home.  He is in agreement to let us know if his symptoms progress.  We will follow this expectantly throughout the remaining treatments.   Carola Rhine, PAC

## 2020-03-22 ENCOUNTER — Ambulatory Visit
Admission: RE | Admit: 2020-03-22 | Discharge: 2020-03-22 | Disposition: A | Discharge status: Home / Self Care | Payer: Medicaid Other | Source: Ambulatory Visit | Attending: Radiation Oncology | Admitting: Radiation Oncology

## 2020-03-22 ENCOUNTER — Telehealth: Payer: Self-pay | Admitting: Oncology

## 2020-03-22 DIAGNOSIS — C20 Malignant neoplasm of rectum: Secondary | ICD-10-CM | POA: Diagnosis not present

## 2020-03-22 NOTE — Telephone Encounter (Signed)
Scheduled appointments per 12/20 los. Spoke to patient who is aware of appointments date and times.  

## 2020-03-23 ENCOUNTER — Ambulatory Visit
Admission: RE | Admit: 2020-03-23 | Discharge: 2020-03-23 | Disposition: A | Discharge status: Home / Self Care | Payer: Medicaid Other | Source: Ambulatory Visit | Attending: Radiation Oncology | Admitting: Radiation Oncology

## 2020-03-23 DIAGNOSIS — C20 Malignant neoplasm of rectum: Secondary | ICD-10-CM | POA: Diagnosis not present

## 2020-03-24 ENCOUNTER — Other Ambulatory Visit: Payer: Self-pay

## 2020-03-24 ENCOUNTER — Emergency Department (HOSPITAL_COMMUNITY): Payer: Medicaid Other

## 2020-03-24 ENCOUNTER — Encounter (HOSPITAL_COMMUNITY): Payer: Self-pay | Admitting: Emergency Medicine

## 2020-03-24 ENCOUNTER — Emergency Department (HOSPITAL_COMMUNITY)
Admission: EM | Admit: 2020-03-24 | Discharge: 2020-03-24 | Disposition: A | Discharge status: Home / Self Care | Payer: Medicaid Other | Attending: Emergency Medicine | Admitting: Emergency Medicine

## 2020-03-24 DIAGNOSIS — E1140 Type 2 diabetes mellitus with diabetic neuropathy, unspecified: Secondary | ICD-10-CM | POA: Insufficient documentation

## 2020-03-24 DIAGNOSIS — Z7984 Long term (current) use of oral hypoglycemic drugs: Secondary | ICD-10-CM | POA: Insufficient documentation

## 2020-03-24 DIAGNOSIS — I132 Hypertensive heart and chronic kidney disease with heart failure and with stage 5 chronic kidney disease, or end stage renal disease: Secondary | ICD-10-CM | POA: Insufficient documentation

## 2020-03-24 DIAGNOSIS — Z85048 Personal history of other malignant neoplasm of rectum, rectosigmoid junction, and anus: Secondary | ICD-10-CM | POA: Diagnosis not present

## 2020-03-24 DIAGNOSIS — E1122 Type 2 diabetes mellitus with diabetic chronic kidney disease: Secondary | ICD-10-CM | POA: Insufficient documentation

## 2020-03-24 DIAGNOSIS — Z87891 Personal history of nicotine dependence: Secondary | ICD-10-CM | POA: Insufficient documentation

## 2020-03-24 DIAGNOSIS — Z79899 Other long term (current) drug therapy: Secondary | ICD-10-CM | POA: Insufficient documentation

## 2020-03-24 DIAGNOSIS — Z85038 Personal history of other malignant neoplasm of large intestine: Secondary | ICD-10-CM | POA: Diagnosis not present

## 2020-03-24 DIAGNOSIS — N185 Chronic kidney disease, stage 5: Secondary | ICD-10-CM | POA: Insufficient documentation

## 2020-03-24 DIAGNOSIS — I5031 Acute diastolic (congestive) heart failure: Secondary | ICD-10-CM | POA: Insufficient documentation

## 2020-03-24 DIAGNOSIS — K625 Hemorrhage of anus and rectum: Secondary | ICD-10-CM | POA: Insufficient documentation

## 2020-03-24 DIAGNOSIS — J45909 Unspecified asthma, uncomplicated: Secondary | ICD-10-CM | POA: Diagnosis not present

## 2020-03-24 LAB — CBC WITH DIFFERENTIAL/PLATELET
Abs Immature Granulocytes: 0.05 10*3/uL (ref 0.00–0.07)
Basophils Absolute: 0 10*3/uL (ref 0.0–0.1)
Basophils Relative: 0 %
Eosinophils Absolute: 0.2 10*3/uL (ref 0.0–0.5)
Eosinophils Relative: 3 %
HCT: 38.9 % — ABNORMAL LOW (ref 39.0–52.0)
Hemoglobin: 12.5 g/dL — ABNORMAL LOW (ref 13.0–17.0)
Immature Granulocytes: 1 %
Lymphocytes Relative: 8 %
Lymphs Abs: 0.7 10*3/uL (ref 0.7–4.0)
MCH: 30.3 pg (ref 26.0–34.0)
MCHC: 32.1 g/dL (ref 30.0–36.0)
MCV: 94.4 fL (ref 80.0–100.0)
Monocytes Absolute: 1 10*3/uL (ref 0.1–1.0)
Monocytes Relative: 12 %
Neutro Abs: 6.4 10*3/uL (ref 1.7–7.7)
Neutrophils Relative %: 76 %
Platelets: 197 10*3/uL (ref 150–400)
RBC: 4.12 MIL/uL — ABNORMAL LOW (ref 4.22–5.81)
RDW: 19.3 % — ABNORMAL HIGH (ref 11.5–15.5)
WBC: 8.5 10*3/uL (ref 4.0–10.5)
nRBC: 0 % (ref 0.0–0.2)

## 2020-03-24 LAB — COMPREHENSIVE METABOLIC PANEL
ALT: 18 U/L (ref 0–44)
AST: 18 U/L (ref 15–41)
Albumin: 3.2 g/dL — ABNORMAL LOW (ref 3.5–5.0)
Alkaline Phosphatase: 105 U/L (ref 38–126)
Anion gap: 10 (ref 5–15)
BUN: 44 mg/dL — ABNORMAL HIGH (ref 6–20)
CO2: 22 mmol/L (ref 22–32)
Calcium: 8.7 mg/dL — ABNORMAL LOW (ref 8.9–10.3)
Chloride: 107 mmol/L (ref 98–111)
Creatinine, Ser: 3.29 mg/dL — ABNORMAL HIGH (ref 0.61–1.24)
GFR, Estimated: 21 mL/min — ABNORMAL LOW (ref >60)
Glucose, Bld: 125 mg/dL — ABNORMAL HIGH (ref 70–99)
Potassium: 4.9 mmol/L (ref 3.5–5.1)
Sodium: 139 mmol/L (ref 135–145)
Total Bilirubin: 0.7 mg/dL (ref 0.3–1.2)
Total Protein: 7 g/dL (ref 6.5–8.1)

## 2020-03-24 LAB — POC OCCULT BLOOD, ED: Fecal Occult Bld: POSITIVE — AB

## 2020-03-24 MED ORDER — ALTEPLASE 2 MG IJ SOLR
2.0000 mg | Freq: Once | INTRAMUSCULAR | Status: AC
  Administered 2020-03-24: 17:00:00 2 mg
  Filled 2020-03-24: qty 2

## 2020-03-24 MED ORDER — AMLODIPINE BESYLATE 5 MG PO TABS: 10.0000 mg | ORAL_TABLET | Freq: Every day | ORAL | 0 refills | Status: DC

## 2020-03-24 NOTE — Discharge Instructions (Addendum)
Thank you for choosing [facility] for your care today.  To do: Please follow-up with your oncologist and your GI specialist within the next few days (I have included contact information for [facility] above).  Please come back to the emergency department if your bleeding worsens, if you start to have symptoms of large-volume blood loss (shortness of breath, chest pain, lightheadedness, passing out), if you start to run a fever, or if you have any other symptoms that worry you. Please follow up with your primary care doctor in the next few days. If you do not have a PCP you are established with, you can call [phone]  or [phone]  to access [facility] service. You can also visit [email or web address] Please come back to the Emergency Department if you have shortness of breath, chest pain, confusion/mental status changes, if you have so much nausea/vomiting that you cannot keep down fluids, or if you have any other symptoms that worry you.  Take care. Hope you start feeling better soon.  Vanna Scotland, MD Emergency Medicine

## 2020-03-24 NOTE — ED Notes (Signed)
Pt had walked out before being d/c, this RN and MD had to chase pt down to have cathflo remove from PICC line and for MD to properly go over d/c info. This RN was unable to get d/c vitals on pt

## 2020-03-24 NOTE — ED Triage Notes (Signed)
Pt arrives to ED with c/o BRBBR x2 days. Denies abdominal pain.

## 2020-03-24 NOTE — ED Provider Notes (Signed)
[facility] EMERGENCY DEPARTMENT Provider Note   CSN: [id number] Arrival date & time: 03/24/20  1321     History Chief Complaint  Patient presents with  . GI Bleeding    Craig West is a 54 y.o. male.  HPI Pt is a 54 y/o male with a history of rectal cancer, asthma, CHF, DM, HTN who presents to ED with complaint of GI bleeding.  Patient notes painless rectal bleeding starting last night.  He has had several episodes of bright red bleeding that he believes is coming from the rectum (rather than from the surrounding tissue).  Some episodes frank blood, some mixed with brown stool, some with bleeding heavy enough to obscure the bottom of the toilet bowl.  No abdominal pain, nausea/vomiting, shortness of breath, lightheadedness, syncope, chest pain.  This has never happened to him before.  He denies placement of any objects rectally or anal penetration during sex.  Patient is currently undergoing active radiation and chemotherapy treatments.  He receives radiation 5 days a week and has chemotherapy running through his RUE PICC line 7 days a week; chemotherapy supply bag is changed every week on Monday.  He says he has received approximately 15 radiation treatments and has about 6 remaining.  On review of recent records, pt underwent IMRT treatment yesterday through [facility]. Pt noted to have fissuring and hypertrophic tissue at gluteal cleft on exam 03/21/20.    Past Medical History:  Diagnosis Date  . Asthma   . CHF (congestive heart failure) ([place])   . Diabetes mellitus without complication ([place])   . Gout   . Hypertension     Patient Active Problem List   Diagnosis Date Noted  . Rectal cancer ([place]) 01/27/2020  . Benign neoplasm of ascending colon   . Benign neoplasm of descending colon   . Benign neoplasm of sigmoid colon   . Rectal mass   . Rectal bleeding   . Iron deficiency anemia   . Acute on chronic congestive heart failure ([place])   . Hyperkalemia   .  Volume overload 01/06/2020  . Acute diastolic heart failure ([place])   . AKI (acute kidney injury) ([place])   . Demand ischemia ([place])   . Hypertensive urgency 12/23/2019  . Obesity (BMI 30.0-34.9) 12/09/2017  . Non compliance w medication regimen 04/18/2017  . Gout 08/02/2016  . Diabetic neuropathy ([place]) 08/02/2016  . CKD (chronic kidney disease), stage V ([place]) 08/02/2016  . Hyperlipidemia 10/24/2015  . Asthma 10/16/2015  . Type 2 diabetes mellitus without complication ([place]) 29/52/8413  . Essential hypertension 06/13/2014  . Smoker 06/13/2014    Past Surgical History:  Procedure Laterality Date  . BIOPSY  01/11/2020   Procedure: BIOPSY;  Surgeon: Irene Shipper, MD;  Location: [facility] ENDOSCOPY;  Service: Endoscopy;;  . COLONOSCOPY WITH PROPOFOL N/A 01/11/2020   Procedure: COLONOSCOPY WITH PROPOFOL;  Surgeon: Irene Shipper, MD;  Location: [facility];  Service: Endoscopy;  Laterality: N/A;  . NO PAST SURGERIES    . POLYPECTOMY  01/11/2020   Procedure: POLYPECTOMY;  Surgeon: Irene Shipper, MD;  Location: [facility] ENDOSCOPY;  Service: Endoscopy;;  . SUBMUCOSAL TATTOO INJECTION  01/11/2020   Procedure: SUBMUCOSAL TATTOO INJECTION;  Surgeon: Irene Shipper, MD;  Location: [facility] ENDOSCOPY;  Service: Endoscopy;;       Family History  Problem Relation Age of Onset  . Heart failure Brother     Social History   Tobacco Use  . Smoking status: Former Smoker  Packs/day: 0.50    Types: Cigarettes    Quit date: 10/27/2019    Years since quitting: 0.4  . Smokeless tobacco: Never Used  Vaping Use  . Vaping Use: Never used  Substance Use Topics  . Alcohol use: Yes    Alcohol/week: 0.0 standard drinks    Comment: occ  . Drug use: No    Home Medications Prior to Admission medications   Medication Sig Start Date End Date Taking? Authorizing Provider  albuterol (VENTOLIN HFA) 108 (90 Base) MCG/ACT inhaler Inhale 2 puffs into the lungs every 6 (six) hours as needed for wheezing or shortness of  breath. 12/27/19  Yes Andrew Au, MD  allopurinol (ZYLOPRIM) 100 MG tablet Take 200 mg by mouth daily.   Yes [provider]  atorvastatin (LIPITOR) 40 MG tablet Take 1 tablet (40 mg total) by mouth daily. For hypercholesterolemia 02/23/20  Yes Charlott Rakes, MD  carvedilol (COREG) 12.5 MG tablet Take 1 tablet (12.5 mg total) by mouth 2 (two) times daily with a meal. 12/27/19  Yes Andrew Au, MD  furosemide (LASIX) 20 MG tablet Take 3 tablets (60 mg total) by mouth 2 (two) times daily. 02/23/20  Yes Charlott Rakes, MD  gabapentin (NEURONTIN) 300 MG capsule Take 300 mg by mouth 2 (two) times daily. 03/14/20  Yes [provider]  glipiZIDE (GLUCOTROL) 5 MG tablet Take 1 tablet (5 mg total) by mouth 2 (two) times daily before a meal. 02/23/20  Yes Newlin, Enobong, MD  hydrALAZINE (APRESOLINE) 50 MG tablet Take 1 tablet (50 mg total) by mouth every 12 (twelve) hours. 02/23/20  Yes Charlott Rakes, MD  isosorbide mononitrate (IMDUR) 60 MG 24 hr tablet Take 60 mg by mouth daily. 03/14/20  Yes [provider]  tamsulosin (FLOMAX) 0.4 MG CAPS capsule Take 1 capsule (0.4 mg total) by mouth daily. 02/23/20  Yes Charlott Rakes, MD  amLODipine (NORVASC) 5 MG tablet Take 2 tablets (10 mg total) by mouth daily. 03/24/20 04/23/20  Gareth Morgan, MD  Blood Glucose Monitoring Suppl (TRUE METRIX METER) w/Device KIT Check blood sugar fasting and ant bedtime and record 05/15/17   Charlott Rakes, MD  colchicine 0.6 MG tablet Take 2 tablets (1.2 mg) orally at the onset of a gout attack; may repeat 1 tablet (0.6 mg) in 2 hours if symptoms persist Patient not taking: Reported on 03/24/2020 12/27/19   Andrew Au, MD  glucose blood (TRUE METRIX BLOOD GLUCOSE TEST) test strip Use as instructed 12/27/19   Andrew Au, MD  Misc. Devices (DIGITAL GLASS SCALE) MISC Use scale to weight yourself. Increasing weight may indicate fluid overload 12/27/19   Andrew Au, MD  prochlorperazine  (COMPAZINE) 10 MG tablet Take 1 tablet (10 mg total) by mouth every 6 (six) hours as needed for nausea. Patient not taking: Reported on 03/24/2020 02/04/20   Ladell Pier, MD  TRUEplus Lancets 28G MISC Check blood sugar fasting and at bedtime 12/27/19   Andrew Au, MD    Allergies    Patient has no known allergies.  Review of Systems   Review of Systems  Constitutional: Negative for chills and fever.  HENT: Negative for ear pain and sore throat.   Eyes: Negative for pain and visual disturbance.  Respiratory: Negative for cough, shortness of breath and wheezing.   Cardiovascular: Negative for chest pain and leg swelling.  Gastrointestinal: Positive for anal bleeding, blood in stool and diarrhea. Negative for abdominal pain, nausea and vomiting.  Genitourinary: Negative  for dysuria and hematuria.  Musculoskeletal: Negative for gait problem and joint swelling.  Skin: Negative for color change and rash.  Neurological: Negative for syncope, light-headedness and headaches.  Psychiatric/Behavioral: Negative for confusion and suicidal ideas.  All other systems reviewed and are negative.   Physical Exam Updated Vital Signs BP (!) 198/141   Pulse 83   Temp 98.5 F (36.9 C) (Oral)   Resp (!) 27   Ht 6' 1"  (1.854 m)   Wt 112 kg   SpO2 95%   BMI 32.58 kg/m   Physical Exam Vitals and nursing note reviewed.  Constitutional:      General: He is not in acute distress.    Appearance: Normal appearance. He is well-developed and well-nourished. He is not ill-appearing or toxic-appearing.  HENT:     Head: Normocephalic and atraumatic.     Nose: Nose normal.     Mouth/Throat:     Mouth: Mucous membranes are moist.     Pharynx: Oropharynx is clear.  Eyes:     General: No scleral icterus.    Conjunctiva/sclera: Conjunctivae normal.     Pupils: Pupils are equal, round, and reactive to light.  Cardiovascular:     Rate and Rhythm: Normal rate and regular rhythm.     Pulses: Normal  pulses.     Heart sounds: No murmur heard. No friction rub. Gallop present.   Pulmonary:     Effort: Pulmonary effort is normal. No respiratory distress.     Breath sounds: Normal breath sounds. No wheezing, rhonchi or rales.  Abdominal:     General: There is no distension.     Palpations: Abdomen is soft.     Tenderness: There is no abdominal tenderness. There is no guarding or rebound.  Genitourinary:    Comments: Chaperoned by Dr. Billy Fischer. There is an area of skin breakdown with fissuring to the upper gluteal cleft over the sacrum. There are a few scattered small sores at the 7 o'clock position near the anus, none of which is bleeding. Patient has tenderness with rectal exam and there is grossly bloody stool on exam without obvious drainage/leakage to suggest active bleeding. Hemoccult positive. Musculoskeletal:        General: No edema.     Cervical back: Neck supple.     Comments: Trace pitting edema to bilateral lower legs  Skin:    General: Skin is warm and dry.  Neurological:     Mental Status: He is alert.     Comments: Alert, grossly oriented, moves all extremities spontaneously  Psychiatric:        Mood and Affect: Mood and affect normal.        Behavior: Behavior normal.     ED Results / Procedures / Treatments   Labs (all labs ordered are listed, but only abnormal results are displayed) Labs Reviewed  COMPREHENSIVE METABOLIC PANEL - Abnormal; Notable for the following components:      Result Value   Glucose, Bld 125 (*)    BUN 44 (*)    Creatinine, Ser 3.29 (*)    Calcium 8.7 (*)    Albumin 3.2 (*)    GFR, Estimated 21 (*)    All other components within normal limits  CBC WITH DIFFERENTIAL/PLATELET - Abnormal; Notable for the following components:   RBC 4.12 (*)    Hemoglobin 12.5 (*)    HCT 38.9 (*)    RDW 19.3 (*)    All other components within normal limits  POC OCCULT BLOOD,  ED - Abnormal; Notable for the following components:   Fecal Occult Bld  POSITIVE (*)    All other components within normal limits  GASTROINTESTINAL PANEL BY PCR, STOOL (REPLACES STOOL CULTURE)  C DIFFICILE (CDIFF) QUICK SCRN (NO PCR REFLEX)    EKG EKG Interpretation  Date/Time:  Friday March 24 2020 15:30:07 EST Ventricular Rate:  80 PR Interval:    QRS Duration: 147 QT Interval:  416 QTC Calculation: 480 R Axis:   -83 Text Interpretation: Sinus rhythm RBBB and LAFB No significant change since last tracing Confirmed by Gareth Morgan [phone]) on 03/24/2020 5:22:21 PM   Radiology DG Chest Portable 1 View  Result Date: 03/24/2020 CLINICAL DATA:  Evaluate for pneumoperitoneum EXAM: PORTABLE CHEST 1 VIEW COMPARISON:  01/06/2020 FINDINGS: Cardiomegaly. Pulmonary vascular prominence without overt pulmonary edema. No acute appearing airspace opacity. Right upper extremity PICC is positioned with tip over the lower superior vena cava. No free air below the diaphragms. IMPRESSION: 1. Cardiomegaly. Pulmonary vascular prominence without overt pulmonary edema. No acute appearing airspace opacity. 2. Right upper extremity PICC is positioned with tip over the lower superior vena cava. 3.  No free air below the diaphragms per stated indication. Electronically Signed   By: Eddie Candle M.D.   On: 03/24/2020 15:40    Procedures Procedures (including critical care time)  Medications Ordered in ED Medications  alteplase (CATHFLO ACTIVASE) injection 2 mg (2 mg Intracatheter Given 03/24/20 1713)    ED Course  I have reviewed the triage vital signs and the nursing notes.  Pertinent labs & imaging results that were available during my care of the patient were reviewed by me and considered in my medical decision making (see chart for details).    MDM Rules/Calculators/A&P                          54 y/o male with rectal bleeding. Patient is hemodynamically stable and well-appearing on initial exam. Patient states that the bleeding has been slowing down and he  denies any symptoms associated with large volume blood loss.  Differentials considered: I am most concerned for chemotherapy or radiation-related mucosal breakdown. Differential includes diverticulosis, less likely hemorrhoidal bleeding (given amount of reported blood loss), less likely radiation proctitis (given painless bleeding and rectal bleeding as less common manifestation), vascular malformation of the lower GI tract, less likely brisk upper GI bleed (given pt's hemodynamic stability). ED provider interpretation of lab results: Hemoglobin is stable and actually improved from most recent blood work. Creatinine elevated (stable from prior) with elevated BUN. Fecal occult blood positive. ED provider interpretation of imaging results: CXR showing cardiomegaly and pulmonary vascular prominence without overt pulmonary edema. There is no pneumoperitoneum to raise suspicion for perforated viscus. ED provider interpretation of ECG: Sinus rhythm with a rate of 80. QRS 147, QTc 480. Left axis deviation. There is right bundle branch block. No STEMI. There are nonspecific T wave changes in the lateral leads. Overall similar to prior.  MDM: Patient has remained hemodynamically stable throughout ED course. On direct inspection, patient does have some residual bleeding in the rectal vault, but is not having active leakage to suggest ongoing heavy bleeding. His hemoglobin is stable. He is asymptomatic otherwise and does not have pain, rectal discharge, or fevers to suggest other acute intra-abdominal pathology or developing infection. Suspect that he can be safely followed up with his oncologist, primary care doctor, and GI specialist within the next few days. Pt has now had  a bowel movement without any new bleeding.  At this time, pt appears stable for discharge with strict return precautions provided and instructions for follow up given. Pt voiced understanding and is amenable to this plan. Pt discharged in stable  condition.  Final Clinical Impression(s) / ED Diagnoses Final diagnoses:  Rectal bleeding    Rx / DC Orders ED Discharge Orders         Ordered    amLODipine (NORVASC) 5 MG tablet  Daily        03/24/20 1859           Vanna Scotland, MD 62/70/04 8498    Gareth Morgan, MD 03/26/20 1205

## 2020-03-26 ENCOUNTER — Other Ambulatory Visit: Payer: Self-pay | Admitting: Oncology

## 2020-03-26 DIAGNOSIS — C20 Malignant neoplasm of rectum: Secondary | ICD-10-CM | POA: Diagnosis not present

## 2020-03-27 ENCOUNTER — Ambulatory Visit
Admission: RE | Admit: 2020-03-27 | Discharge: 2020-03-27 | Disposition: A | Discharge status: Home / Self Care | Payer: Medicaid Other | Source: Ambulatory Visit | Attending: Radiation Oncology | Admitting: Radiation Oncology

## 2020-03-27 ENCOUNTER — Inpatient Hospital Stay: Payer: Medicaid Other

## 2020-03-27 ENCOUNTER — Other Ambulatory Visit: Payer: Self-pay

## 2020-03-27 ENCOUNTER — Telehealth: Payer: Self-pay | Admitting: Radiation Oncology

## 2020-03-27 VITALS — BP 161/110 | HR 89 | Resp 18

## 2020-03-27 DIAGNOSIS — C20 Malignant neoplasm of rectum: Secondary | ICD-10-CM

## 2020-03-27 DIAGNOSIS — Z452 Encounter for adjustment and management of vascular access device: Secondary | ICD-10-CM

## 2020-03-27 DIAGNOSIS — Z5111 Encounter for antineoplastic chemotherapy: Secondary | ICD-10-CM | POA: Diagnosis not present

## 2020-03-27 LAB — CBC WITH DIFFERENTIAL (CANCER CENTER ONLY)
Abs Immature Granulocytes: 0.05 10*3/uL (ref 0.00–0.07)
Basophils Absolute: 0 10*3/uL (ref 0.0–0.1)
Basophils Relative: 0 %
Eosinophils Absolute: 0.2 10*3/uL (ref 0.0–0.5)
Eosinophils Relative: 3 %
HCT: 32.3 % — ABNORMAL LOW (ref 39.0–52.0)
Hemoglobin: 10.6 g/dL — ABNORMAL LOW (ref 13.0–17.0)
Immature Granulocytes: 1 %
Lymphocytes Relative: 6 %
Lymphs Abs: 0.5 10*3/uL — ABNORMAL LOW (ref 0.7–4.0)
MCH: 29.9 pg (ref 26.0–34.0)
MCHC: 32.8 g/dL (ref 30.0–36.0)
MCV: 91.2 fL (ref 80.0–100.0)
Monocytes Absolute: 1 10*3/uL (ref 0.1–1.0)
Monocytes Relative: 12 %
Neutro Abs: 6.7 10*3/uL (ref 1.7–7.7)
Neutrophils Relative %: 78 %
Platelet Count: 204 10*3/uL (ref 150–400)
RBC: 3.54 MIL/uL — ABNORMAL LOW (ref 4.22–5.81)
RDW: 18.9 % — ABNORMAL HIGH (ref 11.5–15.5)
WBC Count: 8.5 10*3/uL (ref 4.0–10.5)
nRBC: 0 % (ref 0.0–0.2)

## 2020-03-27 MED ORDER — SODIUM CHLORIDE 0.9% FLUSH
10.0000 mL | Freq: Once | INTRAVENOUS | Status: AC
  Administered 2020-03-27: 12:00:00 10 mL
  Filled 2020-03-27: qty 10

## 2020-03-27 MED ORDER — SODIUM CHLORIDE 0.9 % IV SOLN
225.0000 mg/m2/d | INTRAVENOUS | Status: DC
  Administered 2020-03-27: 14:00:00 3850 mg via INTRAVENOUS
  Filled 2020-03-27: qty 77

## 2020-03-27 NOTE — Telephone Encounter (Signed)
Radiation therapist on L4 alerted this RN that the patient reported seeing blood in his stool over the weekend. Phoned patient to inquire. No answer and voicemail box had not been set up. Will reach out again later today. If I am unable to reach patient I will request he be sent around for PUT tomorrow.

## 2020-03-27 NOTE — Progress Notes (Signed)
Per Dr. Benay Spice: OK to treat w/elevated BP--this is his norm. OK to treat without a CMP today.

## 2020-03-27 NOTE — Patient Instructions (Signed)
[facility] Discharge Instructions for Patients Receiving Chemotherapy  Today you received the following chemotherapy agents velcade  To help prevent nausea and vomiting after your treatment, we encourage you to take your nausea medication as directed.   If you develop nausea and vomiting that is not controlled by your nausea medication, call the clinic.   BELOW ARE SYMPTOMS THAT SHOULD BE REPORTED IMMEDIATELY:  *FEVER GREATER THAN 100.5 F  *CHILLS WITH OR WITHOUT FEVER  NAUSEA AND VOMITING THAT IS NOT CONTROLLED WITH YOUR NAUSEA MEDICATION  *UNUSUAL SHORTNESS OF BREATH  *UNUSUAL BRUISING OR BLEEDING  TENDERNESS IN MOUTH AND THROAT WITH OR WITHOUT PRESENCE OF ULCERS  *URINARY PROBLEMS  *BOWEL PROBLEMS  UNUSUAL RASH Items with * indicate a potential emergency and should be followed up as soon as possible.  Feel free to call the clinic should you have any questions or concerns. The clinic phone number is [phone].  Please show the CHEMO ALERT CARD at check-in to the Emergency Department and triage nurse.   

## 2020-03-28 ENCOUNTER — Ambulatory Visit
Admission: RE | Admit: 2020-03-28 | Discharge: 2020-03-28 | Disposition: A | Discharge status: Home / Self Care | Payer: Medicaid Other | Source: Ambulatory Visit | Attending: Radiation Oncology | Admitting: Radiation Oncology

## 2020-03-28 DIAGNOSIS — C20 Malignant neoplasm of rectum: Secondary | ICD-10-CM | POA: Diagnosis not present

## 2020-03-29 ENCOUNTER — Ambulatory Visit
Admission: RE | Admit: 2020-03-29 | Discharge: 2020-03-29 | Disposition: A | Discharge status: Home / Self Care | Payer: Medicaid Other | Source: Ambulatory Visit | Attending: Radiation Oncology | Admitting: Radiation Oncology

## 2020-03-29 ENCOUNTER — Ambulatory Visit: Payer: Medicaid Other

## 2020-03-29 DIAGNOSIS — C20 Malignant neoplasm of rectum: Secondary | ICD-10-CM | POA: Diagnosis not present

## 2020-03-29 NOTE — Progress Notes (Signed)
Received an email from treatment therapist yesterday that the patient reported blood in his stool over the weekend. Attempted several times to reach patient by phone yesterday but was unsuccessful. Requested therapist send him around to nursing following his treatment. Received patient in the clinic following radiation treatment. Patient explains he saw a lot of dark red blood in his stool starting on christmas eve. Patient goes onto explain he presented to the ED as directed by [facility] on call nurses but was discharged "after blood level came back fine." Patient reports he has not seen any blood in his stool since Monday, December 27th. Patient's weight and vitals stable. Patient denies headache, dizziness, nausea or vomiting. Patient reports his bowels are loose and urgent. Patient questions if the radiation could be causing this and if so why? Patient understands this RN will pass along his concern to the providers and reach back out to him with directions and/or answers.

## 2020-03-30 ENCOUNTER — Ambulatory Visit: Payer: Medicaid Other

## 2020-03-30 ENCOUNTER — Ambulatory Visit
Admission: RE | Admit: 2020-03-30 | Discharge: 2020-03-30 | Disposition: A | Discharge status: Home / Self Care | Payer: Medicaid Other | Source: Ambulatory Visit | Attending: Radiation Oncology | Admitting: Radiation Oncology

## 2020-03-30 DIAGNOSIS — C20 Malignant neoplasm of rectum: Secondary | ICD-10-CM | POA: Diagnosis not present

## 2020-04-03 ENCOUNTER — Other Ambulatory Visit: Payer: Medicaid Other

## 2020-04-03 ENCOUNTER — Ambulatory Visit (HOSPITAL_BASED_OUTPATIENT_CLINIC_OR_DEPARTMENT_OTHER): Payer: Medicaid Other | Admitting: Medical

## 2020-04-03 ENCOUNTER — Other Ambulatory Visit: Payer: Self-pay | Admitting: Medical

## 2020-04-03 ENCOUNTER — Ambulatory Visit: Payer: Medicaid Other | Admitting: Nurse Practitioner

## 2020-04-03 ENCOUNTER — Other Ambulatory Visit: Payer: Self-pay

## 2020-04-03 ENCOUNTER — Inpatient Hospital Stay: Payer: Medicaid Other

## 2020-04-03 ENCOUNTER — Ambulatory Visit: Payer: Medicaid Other

## 2020-04-03 ENCOUNTER — Encounter: Payer: Self-pay | Admitting: Nurse Practitioner

## 2020-04-03 ENCOUNTER — Inpatient Hospital Stay (HOSPITAL_BASED_OUTPATIENT_CLINIC_OR_DEPARTMENT_OTHER): Payer: Medicaid Other | Admitting: Nurse Practitioner

## 2020-04-03 ENCOUNTER — Ambulatory Visit
Admission: RE | Admit: 2020-04-03 | Discharge: 2020-04-03 | Disposition: A | Discharge status: Home / Self Care | Payer: Medicaid Other | Source: Ambulatory Visit | Attending: Radiation Oncology | Admitting: Radiation Oncology

## 2020-04-03 ENCOUNTER — Other Ambulatory Visit: Payer: Self-pay | Admitting: Medical Oncology

## 2020-04-03 ENCOUNTER — Inpatient Hospital Stay: Payer: Medicaid Other | Attending: Nurse Practitioner

## 2020-04-03 VITALS — Temp 99.8°F

## 2020-04-03 VITALS — BP 178/122 | HR 97 | Temp 99.9°F | Resp 20 | Wt 245.0 lb

## 2020-04-03 DIAGNOSIS — I129 Hypertensive chronic kidney disease with stage 1 through stage 4 chronic kidney disease, or unspecified chronic kidney disease: Secondary | ICD-10-CM | POA: Insufficient documentation

## 2020-04-03 DIAGNOSIS — D649 Anemia, unspecified: Secondary | ICD-10-CM | POA: Insufficient documentation

## 2020-04-03 DIAGNOSIS — R059 Cough, unspecified: Secondary | ICD-10-CM

## 2020-04-03 DIAGNOSIS — C20 Malignant neoplasm of rectum: Secondary | ICD-10-CM | POA: Insufficient documentation

## 2020-04-03 DIAGNOSIS — Z452 Encounter for adjustment and management of vascular access device: Secondary | ICD-10-CM

## 2020-04-03 DIAGNOSIS — U071 COVID-19: Secondary | ICD-10-CM | POA: Insufficient documentation

## 2020-04-03 DIAGNOSIS — M109 Gout, unspecified: Secondary | ICD-10-CM | POA: Insufficient documentation

## 2020-04-03 DIAGNOSIS — E1122 Type 2 diabetes mellitus with diabetic chronic kidney disease: Secondary | ICD-10-CM | POA: Diagnosis not present

## 2020-04-03 DIAGNOSIS — R197 Diarrhea, unspecified: Secondary | ICD-10-CM | POA: Insufficient documentation

## 2020-04-03 DIAGNOSIS — N189 Chronic kidney disease, unspecified: Secondary | ICD-10-CM | POA: Insufficient documentation

## 2020-04-03 LAB — CMP (CANCER CENTER ONLY)
ALT: 19 U/L (ref 0–44)
AST: 20 U/L (ref 15–41)
Albumin: 3.4 g/dL — ABNORMAL LOW (ref 3.5–5.0)
Alkaline Phosphatase: 94 U/L (ref 38–126)
Anion gap: 8 (ref 5–15)
BUN: 43 mg/dL — ABNORMAL HIGH (ref 6–20)
CO2: 25 mmol/L (ref 22–32)
Calcium: 8.7 mg/dL — ABNORMAL LOW (ref 8.9–10.3)
Chloride: 107 mmol/L (ref 98–111)
Creatinine: 3.32 mg/dL (ref 0.61–1.24)
GFR, Estimated: 21 mL/min — ABNORMAL LOW (ref >60)
Glucose, Bld: 126 mg/dL — ABNORMAL HIGH (ref 70–99)
Potassium: 4.2 mmol/L (ref 3.5–5.1)
Sodium: 140 mmol/L (ref 135–145)
Total Bilirubin: 0.6 mg/dL (ref 0.3–1.2)
Total Protein: 7.6 g/dL (ref 6.5–8.1)

## 2020-04-03 LAB — CBC WITH DIFFERENTIAL (CANCER CENTER ONLY)
Abs Immature Granulocytes: 0.05 10*3/uL (ref 0.00–0.07)
Basophils Absolute: 0 10*3/uL (ref 0.0–0.1)
Basophils Relative: 0 %
Eosinophils Absolute: 0.1 10*3/uL (ref 0.0–0.5)
Eosinophils Relative: 1 %
HCT: 32.1 % — ABNORMAL LOW (ref 39.0–52.0)
Hemoglobin: 10.7 g/dL — ABNORMAL LOW (ref 13.0–17.0)
Immature Granulocytes: 1 %
Lymphocytes Relative: 2 %
Lymphs Abs: 0.2 10*3/uL — ABNORMAL LOW (ref 0.7–4.0)
MCH: 30.2 pg (ref 26.0–34.0)
MCHC: 33.3 g/dL (ref 30.0–36.0)
MCV: 90.7 fL (ref 80.0–100.0)
Monocytes Absolute: 1.4 10*3/uL — ABNORMAL HIGH (ref 0.1–1.0)
Monocytes Relative: 15 %
Neutro Abs: 7.4 10*3/uL (ref 1.7–7.7)
Neutrophils Relative %: 81 %
Platelet Count: 185 10*3/uL (ref 150–400)
RBC: 3.54 MIL/uL — ABNORMAL LOW (ref 4.22–5.81)
RDW: 20 % — ABNORMAL HIGH (ref 11.5–15.5)
WBC Count: 9.1 10*3/uL (ref 4.0–10.5)
nRBC: 0 % (ref 0.0–0.2)

## 2020-04-03 LAB — SARS CORONAVIRUS 2 (TAT 6-24 HRS): SARS Coronavirus 2: POSITIVE — AB

## 2020-04-03 MED ORDER — HEPARIN SOD (PORK) LOCK FLUSH 100 UNIT/ML IV SOLN
250.0000 [IU] | Freq: Once | INTRAVENOUS | Status: AC
Start: 2020-04-03 — End: 2020-04-03
  Administered 2020-04-03: 250 [IU]
  Filled 2020-04-03: qty 5

## 2020-04-03 MED ORDER — HEPARIN SOD (PORK) LOCK FLUSH 100 UNIT/ML IV SOLN
250.0000 [IU] | Freq: Once | INTRAVENOUS | Status: DC
  Filled 2020-04-03: qty 5

## 2020-04-03 MED ORDER — SODIUM CHLORIDE 0.9% FLUSH
10.0000 mL | Freq: Once | INTRAVENOUS | Status: DC
  Filled 2020-04-03: qty 10

## 2020-04-03 MED ORDER — HEPARIN SOD (PORK) LOCK FLUSH 100 UNIT/ML IV SOLN
500.0000 [IU] | Freq: Once | INTRAVENOUS | Status: AC
  Administered 2020-04-03: 2.5 [IU] via INTRAVENOUS
  Filled 2020-04-03: qty 5

## 2020-04-03 MED ORDER — SODIUM CHLORIDE 0.9% FLUSH
10.0000 mL | Freq: Once | INTRAVENOUS | Status: AC
Start: 2020-04-03 — End: 2020-04-03
  Administered 2020-04-03: 10 mL via INTRAVENOUS
  Filled 2020-04-03: qty 10

## 2020-04-03 MED ORDER — SODIUM CHLORIDE 0.9% FLUSH
10.0000 mL | Freq: Once | INTRAVENOUS | Status: AC
  Administered 2020-04-03: 10 mL
  Filled 2020-04-03: qty 10

## 2020-04-03 NOTE — Progress Notes (Unsigned)
picc line orders

## 2020-04-03 NOTE — Progress Notes (Signed)
  [facility] OFFICE PROGRESS NOTE   Diagnosis: Rectal cancer  INTERVAL HISTORY:   Craig West returns as scheduled.  He continues infusional 5-FU and radiation.  He reports a nonproductive cough over the past 2 days as well as a runny nose.  He denies shortness of breath.  Diarrhea is unchanged.  He estimates 10 loose stools a day, small volume.  He has never tried Imodium.  He occasionally notes blood after a bowel movement but overall the bleeding is less as compared to pretreatment.  He denies mouth sores.  No nausea or vomiting.  His power is out at home.  Due to that he did not take his blood pressure medications this morning.  Objective:  Vital signs in last 24 hours: Temperature 99.8, blood pressure 178/122, heart rate 97, respirations 28, oxygen saturation 100% on room air  HEENT: No thrush or ulcers. Resp: Lungs clear bilaterally. Cardio: Regular rate and rhythm. GI: Abdomen soft and nontender.  No hepatomegaly. Vascular: No leg edema. Neuro: Alert and oriented. Skin: Palms dry appearing. Right upper extremity PICC site without erythema.   Lab Results:  Lab Results  Component Value Date   WBC 9.1 04/03/2020   HGB 10.7 (L) 04/03/2020   HCT 32.1 (L) 04/03/2020   MCV 90.7 04/03/2020   PLT 185 04/03/2020   NEUTROABS 7.4 04/03/2020    Imaging:  No results found.  Medications: I have reviewed the patient's current medications.  Assessment/Plan: 1. Rectal cancer-rectal mass at 5-6 cm from the anal verge on colonoscopy 01/11/2020, biopsy confirmed adenocarcinoma ? CTs 01/11/2020-rectal thickening, small perirectal lymph nodes and external iliac nodes, moderate right pleural effusion ? MRI pelvis 01/27/2020-T3bN2b tumor at 7.1 cm from the internal anal sphincter, multiple mesorectal nodes, indistinct left pelvic sidewall node and enlarged bilateral external iliac nodes ? PET scan 02/11/2020-hypermetabolic rectosigmoid mass, hypermetabolic mesorectal and  sigmoid mesocolon lymph nodes, left external iliac/obturator node with mild hypermetabolism, 18 mm left inguinal node with normal architecture and slight hypermetabolism ? Radiation and infusional 5-FU 02/21/2020-04/04/2020 2. Anemia 3. Chronic renal failure 4. Congestive heart failure-severe LVH 5. Diabetes 6. Gout 7. Peripheral neuropathy?   Disposition: Mr. Daily appears stable.  He completed the course of infusional 5-FU today.  Last radiation appointment is scheduled tomorrow.  He has an appointment with Dr. Marcello Moores tomorrow.  He continues to have diarrhea.  He reports this has been ongoing for many weeks.  I encouraged him to try Imodium.  He will continue to push fluids orally.  Blood pressure remains markedly elevated.  I encouraged him to take his blood pressure medication as prescribed.  He has a recent cough, rhinorrhea.  Covid test is pending.  We will see him back in 2 weeks.  Plan reviewed with Dr. Benay Spice.  [name] ANP/GNP-BC   04/03/2020  10:38 AM

## 2020-04-03 NOTE — Addendum Note (Signed)
Addended by: Raynelle Bring on: 04/03/2020 12:32 PM   Modules accepted: Orders

## 2020-04-03 NOTE — Progress Notes (Signed)
This patient was seen for collection of a COVID-19 sample per the request of Dr. Dominica Severin B. Sherrill.  A sample was collected and was submitted for analysis.  Sandi Mealy, MHS, PA-C Physician Assistant

## 2020-04-04 ENCOUNTER — Ambulatory Visit (HOSPITAL_COMMUNITY)
Admission: RE | Admit: 2020-04-04 | Discharge: 2020-04-04 | Disposition: A | Discharge status: Home / Self Care | Payer: Medicaid Other | Source: Ambulatory Visit | Attending: Pulmonary Disease | Admitting: Pulmonary Disease

## 2020-04-04 ENCOUNTER — Encounter: Payer: Self-pay | Admitting: Nurse Practitioner

## 2020-04-04 ENCOUNTER — Ambulatory Visit: Payer: Medicaid Other

## 2020-04-04 ENCOUNTER — Encounter: Payer: Self-pay | Admitting: Radiation Oncology

## 2020-04-04 ENCOUNTER — Telehealth: Payer: Self-pay | Admitting: Radiation Oncology

## 2020-04-04 ENCOUNTER — Ambulatory Visit
Admission: RE | Admit: 2020-04-04 | Discharge: 2020-04-04 | Disposition: A | Discharge status: Home / Self Care | Payer: Medicaid Other | Source: Ambulatory Visit | Attending: Radiation Oncology | Admitting: Radiation Oncology

## 2020-04-04 ENCOUNTER — Other Ambulatory Visit: Payer: Self-pay | Admitting: Nurse Practitioner

## 2020-04-04 ENCOUNTER — Telehealth: Payer: Self-pay | Admitting: Nurse Practitioner

## 2020-04-04 ENCOUNTER — Telehealth: Payer: Self-pay

## 2020-04-04 DIAGNOSIS — E119 Type 2 diabetes mellitus without complications: Secondary | ICD-10-CM

## 2020-04-04 DIAGNOSIS — E1122 Type 2 diabetes mellitus with diabetic chronic kidney disease: Secondary | ICD-10-CM | POA: Insufficient documentation

## 2020-04-04 DIAGNOSIS — J4520 Mild intermittent asthma, uncomplicated: Secondary | ICD-10-CM | POA: Insufficient documentation

## 2020-04-04 DIAGNOSIS — N184 Chronic kidney disease, stage 4 (severe): Secondary | ICD-10-CM | POA: Insufficient documentation

## 2020-04-04 DIAGNOSIS — I10 Essential (primary) hypertension: Secondary | ICD-10-CM

## 2020-04-04 DIAGNOSIS — U071 COVID-19: Secondary | ICD-10-CM | POA: Diagnosis not present

## 2020-04-04 DIAGNOSIS — C20 Malignant neoplasm of rectum: Secondary | ICD-10-CM

## 2020-04-04 DIAGNOSIS — I129 Hypertensive chronic kidney disease with stage 1 through stage 4 chronic kidney disease, or unspecified chronic kidney disease: Secondary | ICD-10-CM | POA: Insufficient documentation

## 2020-04-04 MED ORDER — SODIUM CHLORIDE 0.9 % IV SOLN: INTRAVENOUS | Status: DC | PRN

## 2020-04-04 MED ORDER — FAMOTIDINE IN NACL 20-0.9 MG/50ML-% IV SOLN: 20.0000 mg | Freq: Once | INTRAVENOUS | Status: DC | PRN

## 2020-04-04 MED ORDER — SOTROVIMAB 500 MG/8ML IV SOLN
500.0000 mg | Freq: Once | INTRAVENOUS | Status: AC
  Administered 2020-04-04: 500 mg via INTRAVENOUS

## 2020-04-04 MED ORDER — METHYLPREDNISOLONE SODIUM SUCC 125 MG IJ SOLR: 125.0000 mg | Freq: Once | INTRAMUSCULAR | Status: DC | PRN

## 2020-04-04 MED ORDER — EPINEPHRINE 0.3 MG/0.3ML IJ SOAJ: 0.3000 mg | Freq: Once | INTRAMUSCULAR | Status: DC | PRN

## 2020-04-04 MED ORDER — DIPHENHYDRAMINE HCL 50 MG/ML IJ SOLN: 50.0000 mg | Freq: Once | INTRAMUSCULAR | Status: DC | PRN

## 2020-04-04 MED ORDER — ALBUTEROL SULFATE HFA 108 (90 BASE) MCG/ACT IN AERS: 2.0000 | INHALATION_SPRAY | Freq: Once | RESPIRATORY_TRACT | Status: DC | PRN

## 2020-04-04 NOTE — Discharge Instructions (Signed)
10 Things You Can Do to Manage Your COVID-19 Symptoms at Home If you have possible or confirmed COVID-19: 1. Stay home from work and school. And stay away from other public places. If you must go out, avoid using any kind of public transportation, ridesharing, or taxis. 2. Monitor your symptoms carefully. If your symptoms get worse, call your healthcare provider immediately. 3. Get rest and stay hydrated. 4. If you have a medical appointment, call the healthcare provider ahead of time and tell them that you have or may have COVID-19. 5. For medical emergencies, call 911 and notify the dispatch personnel that you have or may have COVID-19. 6. Cover your cough and sneezes with a tissue or use the inside of your elbow. 7. Wash your hands often with soap and water for at least 20 seconds or clean your hands with an alcohol-based hand sanitizer that contains at least 60% alcohol. 8. As much as possible, stay in a specific room and away from other people in your home. Also, you should use a separate bathroom, if available. If you need to be around other people in or outside of the home, wear a mask. 9. Avoid sharing personal items with other people in your household, like dishes, towels, and bedding. 10. Clean all surfaces that are touched often, like counters, tabletops, and doorknobs. Use household cleaning sprays or wipes according to the label instructions. cdc.gov/coronavirus 09/30/2018 This information is not intended to replace advice given to you by your health care provider. Make sure you discuss any questions you have with your health care provider. Document Revised: 03/04/2019 Document Reviewed: 03/04/2019 Elsevier Patient Education  2020 Elsevier Inc. What types of side effects do monoclonal antibody drugs cause?  Common side effects  In general, the more common side effects caused by monoclonal antibody drugs include: . Allergic reactions, such as hives or itching . Flu-like signs and  symptoms, including chills, fatigue, fever, and muscle aches and pains . Nausea, vomiting . Diarrhea . Skin rashes . Low blood pressure   The CDC is recommending patients who receive monoclonal antibody treatments wait at least 90 days before being vaccinated.  Currently, there are no data on the safety and efficacy of mRNA COVID-19 vaccines in persons who received monoclonal antibodies or convalescent plasma as part of COVID-19 treatment. Based on the estimated half-life of such therapies as well as evidence suggesting that reinfection is uncommon in the 90 days after initial infection, vaccination should be deferred for at least 90 days, as a precautionary measure until additional information becomes available, to avoid interference of the antibody treatment with vaccine-induced immune responses. If you have any questions or concerns after the infusion please call the Advanced Practice Provider on call at [phone]. This number is ONLY intended for your use regarding questions or concerns about the infusion post-treatment side-effects.  Please do not provide this number to others for use. For return to work notes please contact your primary care provider.   If someone you know is interested in receiving treatment please have them call the COVID hotline at [phone].   

## 2020-04-04 NOTE — Telephone Encounter (Signed)
I spoke with Shamera at [facility] regarding patient's positive Covid test and his appointment today with Dr. Marcello Moores at 11:00.  I explained we have been trying to reach him to let him know of his positive results but he does not answer his phone and he has no voice mail set up.  She states she will alert the front desk and let Dr. Marcello Moores know if case he comes in for his appointment.

## 2020-04-04 NOTE — Progress Notes (Addendum)
Note: BP continues to be elevated, which is ongoing medical issue.  Pt going for last radiation treatment today and must be there by 4:30 pm.  Diagnosis: COVID-19  Physician: Dr. Asencion Noble  Procedure: Covid Infusion Clinic Med: Sotrovimab infusion - Provided patient with sotrovimab fact sheet for patients, parents, and caregivers prior to infusion.   Complications: No immediate complications noted  Discharge: Discharged and to drive to cancer center here at [facility]

## 2020-04-04 NOTE — Progress Notes (Signed)
I connected by phone with Craig West on 04/04/2020 at 1:39 PM to discuss the potential use of a new treatment for mild to moderate COVID-19 viral infection in non-hospitalized patients.  This patient is a 55 y.o. male that meets the FDA criteria for Emergency Use Authorization of COVID monoclonal antibody casirivimab/imdevimab, bamlanivimab/etesevimab, or sotrovimab.  Has a (+) direct SARS-CoV-2 viral test result  Has mild or moderate COVID-19   Is NOT hospitalized due to COVID-19  Is within 10 days of symptom onset  Has at least one of the high risk factor(s) for progression to severe COVID-19 and/or hospitalization as defined in EUA.  Specific high risk criteria : BMI > 25, Chronic Kidney Disease (CKD), Diabetes, Immunosuppressive Disease or Treatment, Cardiovascular disease or hypertension and Chronic Lung Disease   I have spoken and communicated the following to the patient or parent/caregiver regarding COVID monoclonal antibody treatment:  1. FDA has authorized the emergency use for the treatment of mild to moderate COVID-19 in adults and pediatric patients with positive results of direct SARS-CoV-2 viral testing who are 89 years of age and older weighing at least 40 kg, and who are at high risk for progressing to severe COVID-19 and/or hospitalization.  2. The significant known and potential risks and benefits of COVID monoclonal antibody, and the extent to which such potential risks and benefits are unknown.  3. Information on available alternative treatments and the risks and benefits of those alternatives, including clinical trials.  4. Patients treated with COVID monoclonal antibody should continue to self-isolate and use infection control measures (e.g., wear mask, isolate, social distance, avoid sharing personal items, clean and disinfect "high touch" surfaces, and frequent handwashing) according to CDC guidelines.   5. The patient or parent/caregiver has the option to accept  or refuse COVID monoclonal antibody treatment.  After reviewing this information with the patient, the patient has agreed to receive one of the available covid 19 monoclonal antibodies and will be provided an appropriate fact sheet prior to infusion. Murray Hodgkins, NP 04/04/2020 1:39 PM

## 2020-04-04 NOTE — Progress Notes (Signed)
Patient reviewed Fact Sheet for Patients, Parents, and Caregivers for Emergency Use Authorization (EUA) of Sotrovimab for the Treatment of Coronavirus. Patient also reviewed and is agreeable to the estimated cost of treatment. Patient is agreeable to proceed.   

## 2020-04-04 NOTE — Progress Notes (Signed)
The patient tested positive for COVID-19, he has had 3 Covid vaccines with [facility], the last of which was in November 2021.  He is symptomatic and was tested yesterday and symptom management clinic in medical oncology.  He has 1 more remaining radiation treatment to go along with his chemoradiation regimen for rectal cancer.  Our department has created a protocol to be able to safely treat Covid positive patients, and he is scheduled to receive his radiation which will complete his course this afternoon when other patients around the department.  He called and was informed of his COVID-19 status, we are also working with monoclonal antibody infusion clinic to screen him to determine if he would be a good candidate for infusion.  Nursing is also calling him back to communicate the plan to try and get him into this clinic if appropriate and to keep his phone available as he does not have voicemail set up.     Carola Rhine, PAC

## 2020-04-04 NOTE — Telephone Encounter (Signed)
Scheduled appointments per 1/3 los. Spoke to patient who is aware of appointment date and time.  °

## 2020-04-04 NOTE — Telephone Encounter (Signed)
I called the patient to let him know his test was positive for covid but could not reach the patient. I've reached out to medical oncology as well about trying to determine if he is a candidate for monoclonal antibody infusion or immunoglobulin infusion. Unfortunately the patient did not answer my call or have VM set up. He is already aware to come in for his last treatment this afternoon in the last treatment slot where other patients will be out of the department.

## 2020-04-04 NOTE — Progress Notes (Signed)
Informed Sarabeth, NP that pt has BP 199/136 (156) which is consistent with recent BP's. He is asymptomatic. Laretta Bolster is okay with starting the sotrovimab.

## 2020-04-05 ENCOUNTER — Encounter: Payer: Self-pay | Admitting: *Deleted

## 2020-04-05 ENCOUNTER — Ambulatory Visit: Payer: Medicaid Other | Admitting: Physician Assistant

## 2020-04-05 ENCOUNTER — Ambulatory Visit: Payer: Medicaid Other

## 2020-04-05 ENCOUNTER — Other Ambulatory Visit: Payer: Self-pay

## 2020-04-05 ENCOUNTER — Ambulatory Visit: Payer: Medicaid Other | Attending: Physician Assistant | Admitting: Physician Assistant

## 2020-04-05 ENCOUNTER — Other Ambulatory Visit: Payer: Self-pay | Admitting: Physician Assistant

## 2020-04-05 DIAGNOSIS — R399 Unspecified symptoms and signs involving the genitourinary system: Secondary | ICD-10-CM | POA: Diagnosis not present

## 2020-04-05 DIAGNOSIS — I129 Hypertensive chronic kidney disease with stage 1 through stage 4 chronic kidney disease, or unspecified chronic kidney disease: Secondary | ICD-10-CM

## 2020-04-05 DIAGNOSIS — I110 Hypertensive heart disease with heart failure: Secondary | ICD-10-CM

## 2020-04-05 DIAGNOSIS — N184 Chronic kidney disease, stage 4 (severe): Secondary | ICD-10-CM

## 2020-04-05 DIAGNOSIS — E1122 Type 2 diabetes mellitus with diabetic chronic kidney disease: Secondary | ICD-10-CM

## 2020-04-05 DIAGNOSIS — I5032 Chronic diastolic (congestive) heart failure: Secondary | ICD-10-CM

## 2020-04-05 MED ORDER — FUROSEMIDE 20 MG PO TABS: 60.0000 mg | ORAL_TABLET | Freq: Two times a day (BID) | ORAL | 2 refills | Status: DC

## 2020-04-05 MED ORDER — TAMSULOSIN HCL 0.4 MG PO CAPS: 0.4000 mg | ORAL_CAPSULE | Freq: Every day | ORAL | 2 refills | Status: DC

## 2020-04-05 MED ORDER — AMLODIPINE BESYLATE 5 MG PO TABS: 10.0000 mg | ORAL_TABLET | Freq: Every day | ORAL | 2 refills | Status: DC

## 2020-04-05 MED ORDER — GABAPENTIN 300 MG PO CAPS: 300.0000 mg | ORAL_CAPSULE | Freq: Two times a day (BID) | ORAL | 5 refills | Status: DC

## 2020-04-05 MED ORDER — ATORVASTATIN CALCIUM 40 MG PO TABS
40.0000 mg | ORAL_TABLET | Freq: Every day | ORAL | 2 refills | Status: DC
Start: 2020-04-05 — End: 2020-05-25

## 2020-04-05 MED ORDER — CARVEDILOL 12.5 MG PO TABS: 12.5000 mg | ORAL_TABLET | Freq: Two times a day (BID) | ORAL | 2 refills | Status: DC

## 2020-04-05 MED ORDER — HYDRALAZINE HCL 50 MG PO TABS: 50.0000 mg | ORAL_TABLET | Freq: Three times a day (TID) | ORAL | 2 refills | Status: DC

## 2020-04-05 MED ORDER — ISOSORBIDE MONONITRATE ER 60 MG PO TB24
60.0000 mg | ORAL_TABLET | Freq: Every day | ORAL | 2 refills | Status: DC
Start: 2020-04-05 — End: 2020-05-25

## 2020-04-05 MED ORDER — GLIPIZIDE 5 MG PO TABS: 5.0000 mg | ORAL_TABLET | Freq: Two times a day (BID) | ORAL | 6 refills | Status: DC

## 2020-04-05 NOTE — Progress Notes (Signed)
Virtual Visit via Telephone Note  I connected with Craig West on 04/05/20 at 10:50 AM EST by telephone and verified that I am speaking with the correct person using two identifiers.  Location: Patient: Craig West Provider: Freeman Caldron, PA-C   I discussed the limitations, risks, security and privacy concerns of performing an evaluation and management service by telephone and the availability of in person appointments. I also discussed with the patient that there may be a patient responsible charge related to this service. The patient expressed understanding and agreed to proceed.  PATIENT visit by telephone virtually in the context of Covid-19 pandemic. Patient location:  home My Location:  [place] office Persons on the call: me and the patient   History of Present Illness:  See last few BP in epic-all very high.  In December, range with several readings was 138/98-198/141.  He is taking 5mg  amlodipine, hydralazine 50mg  bid and coreg 12.5mg  bid.  He denies CP/SOB/dizziness.  Needs RF on all meds    Observations/Objective: NAD.  A&Ox3   Assessment and Plan: 1. 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  Check BP daily and record and bring to next visit - hydrALAZINE (APRESOLINE) 50 MG tablet; Take 1 tablet (50 mg total) by mouth 3 (three) times daily.  Dispense: 90 tablet; Refill: 2 - amLODipine (NORVASC) 5 MG tablet; Take 2 tablets (10 mg total) by mouth daily.  Dispense: 60 tablet; Refill: 2  2. Type 2 diabetes mellitus with stage 4 chronic kidney disease, without long-term current use of insulin (HCC) - glipiZIDE (GLUCOTROL) 5 MG tablet; Take 1 tablet (5 mg total) by mouth 2 (two) times daily before a meal.  Dispense: 60 tablet; Refill: 6 - gabapentin (NEURONTIN) 300 MG capsule; Take 1 capsule (300 mg total) by mouth 2 (two) times daily.  Dispense: 60 capsule; Refill: 5 -  atorvastatin (LIPITOR) 40 MG tablet; Take 1 tablet (40 mg total) by mouth daily. For hypercholesterolemia  Dispense: 30 tablet; Refill: 2  3. Hypertensive heart disease with chronic diastolic congestive heart failure (HCC) - isosorbide mononitrate (IMDUR) 60 MG 24 hr tablet; Take 1 tablet (60 mg total) by mouth daily.  Dispense: 30 tablet; Refill: 2 - furosemide (LASIX) 20 MG tablet; Take 3 tablets (60 mg total) by mouth 2 (two) times daily.  Dispense: 180 tablet; Refill: 2 - carvedilol (COREG) 12.5 MG tablet; Take 1 tablet (12.5 mg total) by mouth 2 (two) times daily with a meal.  Dispense: 60 tablet; Refill: 2  4. Lower urinary tract symptoms - tamsulosin (FLOMAX) 0.4 MG CAPS capsule; Take 1 capsule (0.4 mg total) by mouth daily.  Dispense: 30 capsule; Refill: 2   Follow Up Instructions: He has a PCP appt in Feb 2022 To ED if any symptoms develop or BP doesn't start to respond to meds   I discussed the assessment and treatment plan with the patient. The patient was provided an opportunity to ask questions and all were answered. The patient agreed with the plan and demonstrated an understanding of the instructions.   The patient was advised to call back or seek an in-person evaluation if the symptoms worsen or if the condition fails to improve as anticipated.  I provided 17 minutes of non-face-to-face time during this encounter.   Freeman Caldron, PA-C  Patient ID: Yani Lal, male   DOB: 1966/02/16, 55 y.o.   MRN: [id number]

## 2020-04-05 NOTE — Progress Notes (Signed)
Due to testing + covid on 04/03/20, scheduling message sent to reschedule his 1/18 lab/OV to 04/24/20.

## 2020-04-06 MED FILL — FUROSEMIDE 20 MG TABS: 20 | 30 days supply | Qty: 180 | Fill #0

## 2020-04-10 ENCOUNTER — Ambulatory Visit: Payer: Self-pay | Admitting: Family Medicine

## 2020-04-10 ENCOUNTER — Telehealth: Payer: Self-pay | Admitting: Oncology

## 2020-04-10 NOTE — Telephone Encounter (Signed)
Called pt per 1/7 sch msg- no answer and no vmail - mailed letter with new appt date and time

## 2020-04-10 NOTE — Progress Notes (Signed)
  Radiation Oncology         [phone]) [phone] ________________________________  Name: Craig West MRN: [id number]  Date: 03/21/2020  DOB: 09-Feb-1966  SIMULATION NOTE   NARRATIVE:  The patient underwent simulation today for ongoing radiation therapy.  The existing CT study set was employed for the purpose of virtual treatment planning.  The target and avoidance structures were reviewed and modified as necessary.  Treatment planning then occurred.  The radiation boost prescription was entered and confirmed.  A total of 3 complex treatment devices were fabricated in the form of multi-leaf collimators to shape radiation around the targets while maximally excluding nearby normal structures. I have requested : IMRT Plan.    PLAN:  This modified radiation beam arrangement is intended to continue the current radiation dose to an additional 5.4 Gy in 3 fractions for a total cumulative dose of 50.4 Gy.    ------------------------------------------------  Jodelle Gross, MD, PhD

## 2020-04-11 MED FILL — GABAPENTIN 300 MG CAPSULE: 300 | 30 days supply | Qty: 60 | Fill #0

## 2020-04-18 ENCOUNTER — Ambulatory Visit: Payer: Medicaid Other | Admitting: Oncology

## 2020-04-18 ENCOUNTER — Other Ambulatory Visit: Payer: Medicaid Other

## 2020-04-26 NOTE — Progress Notes (Signed)
  Radiation Oncology         [phone]) [phone] ________________________________  Name: Craig West MRN: [id number]  Date: 04/04/2020  DOB: 1965/11/28  End of Treatment Note  Diagnosis:   Rectal cancer     Indication for treatment:  Curative       Radiation treatment dates:   02/21/20 - 04/04/20  Site/dose:    The patient was treated to the pelvis to a dose of 45 Gy at 1.8 Gy per fraction. This was accomplished using a 3 field IMRT technique. The patient then received a boost to the tumor and adjacent high-risk regions for an additional 5.4 Gy at 1.8 gray per fraction. This was carried out using a coned-down 3 field approach, IMRT. The patient's total dose was 50.4 Gy. The patient received concurrent chemotherapy during the course of radiation treatment.  Narrative: The patient tolerated radiation treatment relatively well.     Plan: The patient has completed radiation treatment. The patient will return to radiation oncology clinic for routine followup in one month. I advised the patient to call or return sooner if they have any questions or concerns related to their recovery or treatment.   ------------------------------------------------  Jodelle Gross, MD, PhD

## 2020-05-01 ENCOUNTER — Inpatient Hospital Stay: Payer: Medicaid Other

## 2020-05-01 ENCOUNTER — Ambulatory Visit
Admission: RE | Admit: 2020-05-01 | Discharge: 2020-05-01 | Disposition: A | Discharge status: Home / Self Care | Payer: Medicaid Other | Source: Ambulatory Visit | Attending: Radiation Oncology | Admitting: Radiation Oncology

## 2020-05-01 ENCOUNTER — Other Ambulatory Visit: Payer: Self-pay

## 2020-05-01 ENCOUNTER — Telehealth: Payer: Self-pay | Admitting: Oncology

## 2020-05-01 ENCOUNTER — Inpatient Hospital Stay (HOSPITAL_BASED_OUTPATIENT_CLINIC_OR_DEPARTMENT_OTHER): Payer: Medicaid Other | Admitting: Oncology

## 2020-05-01 VITALS — BP 170/120 | HR 89 | Temp 98.3°F | Resp 17 | Ht 73.0 in | Wt 237.9 lb

## 2020-05-01 DIAGNOSIS — C20 Malignant neoplasm of rectum: Secondary | ICD-10-CM

## 2020-05-01 LAB — CMP (CANCER CENTER ONLY)
ALT: 14 U/L (ref 0–44)
AST: 18 U/L (ref 15–41)
Albumin: 3.4 g/dL — ABNORMAL LOW (ref 3.5–5.0)
Alkaline Phosphatase: 112 U/L (ref 38–126)
Anion gap: 9 (ref 5–15)
BUN: 37 mg/dL — ABNORMAL HIGH (ref 6–20)
CO2: 23 mmol/L (ref 22–32)
Calcium: 9.5 mg/dL (ref 8.9–10.3)
Chloride: 106 mmol/L (ref 98–111)
Creatinine: 3.33 mg/dL (ref 0.61–1.24)
GFR, Estimated: 21 mL/min — ABNORMAL LOW (ref >60)
Glucose, Bld: 121 mg/dL — ABNORMAL HIGH (ref 70–99)
Potassium: 4.6 mmol/L (ref 3.5–5.1)
Sodium: 138 mmol/L (ref 135–145)
Total Bilirubin: 0.7 mg/dL (ref 0.3–1.2)
Total Protein: 8.3 g/dL — ABNORMAL HIGH (ref 6.5–8.1)

## 2020-05-01 LAB — CBC WITH DIFFERENTIAL (CANCER CENTER ONLY)
Abs Immature Granulocytes: 0.04 10*3/uL (ref 0.00–0.07)
Basophils Absolute: 0 10*3/uL (ref 0.0–0.1)
Basophils Relative: 0 %
Eosinophils Absolute: 0.2 10*3/uL (ref 0.0–0.5)
Eosinophils Relative: 2 %
HCT: 38.7 % — ABNORMAL LOW (ref 39.0–52.0)
Hemoglobin: 12.6 g/dL — ABNORMAL LOW (ref 13.0–17.0)
Immature Granulocytes: 0 %
Lymphocytes Relative: 8 %
Lymphs Abs: 0.9 10*3/uL (ref 0.7–4.0)
MCH: 30.1 pg (ref 26.0–34.0)
MCHC: 32.6 g/dL (ref 30.0–36.0)
MCV: 92.4 fL (ref 80.0–100.0)
Monocytes Absolute: 1.2 10*3/uL — ABNORMAL HIGH (ref 0.1–1.0)
Monocytes Relative: 10 %
Neutro Abs: 8.9 10*3/uL — ABNORMAL HIGH (ref 1.7–7.7)
Neutrophils Relative %: 80 %
Platelet Count: 200 10*3/uL (ref 150–400)
RBC: 4.19 MIL/uL — ABNORMAL LOW (ref 4.22–5.81)
RDW: 17.2 % — ABNORMAL HIGH (ref 11.5–15.5)
WBC Count: 11.2 10*3/uL — ABNORMAL HIGH (ref 4.0–10.5)
nRBC: 0 % (ref 0.0–0.2)

## 2020-05-01 NOTE — Progress Notes (Signed)
  [facility] OFFICE PROGRESS NOTE   Diagnosis: Rectal cancer  INTERVAL HISTORY:   Craig West completed radiation and infusional 5-FU on 04/04/2020.  No diarrhea or rectal bleeding.  He has hyperpigmentation of the hands and feet.  No other complaint.  He was diagnosed with COVID-19 infection on 04/03/2020.  He is not taking hypertensin medication.  He says he cannot afford the medication.  Objective:  Vital signs in last 24 hours:  Blood pressure (!) 201/144, pulse 89, temperature 98.3 F (36.8 C), resp. rate 17, height 6\' 1"  (1.854 m), weight 237 lb 14.4 oz (107.9 kg), SpO2 100 %.    Lymphatics: No cervical, supraclavicular, axillary, or inguinal nodes Resp: Lungs clear bilaterally Cardio: Regular rate and rhythm GI: No hepatosplenomegaly, no mass, nontender Vascular: No leg edema  Skin: Hyperpigmentation of the hands and feet, hyperpigmentation with superficial desquamation at the groin and perineum  Lab Results:  Lab Results  Component Value Date   WBC 11.2 (H) 05/01/2020   HGB 12.6 (L) 05/01/2020   HCT 38.7 (L) 05/01/2020   MCV 92.4 05/01/2020   PLT 200 05/01/2020   NEUTROABS 8.9 (H) 05/01/2020    CMP  Lab Results  Component Value Date   NA 140 04/03/2020   K 4.2 04/03/2020   CL 107 04/03/2020   CO2 25 04/03/2020   GLUCOSE 126 (H) 04/03/2020   BUN 43 (H) 04/03/2020   CREATININE 3.32 (HH) 04/03/2020   CALCIUM 8.7 (L) 04/03/2020   PROT 7.6 04/03/2020   ALBUMIN 3.4 (L) 04/03/2020   AST 20 04/03/2020   ALT 19 04/03/2020   ALKPHOS 94 04/03/2020   BILITOT 0.6 04/03/2020   GFRNONAA 21 (L) 04/03/2020   GFRAA 15 (L) 01/03/2020    Lab Results  Component Value Date   CEA1 2.8 01/11/2020    No results found for: INR  Imaging:  No results found.  Medications: I have reviewed the patient's current medications.   Assessment/Plan: 1. Rectal cancer-rectal mass at 5-6 cm from the anal verge on colonoscopy 01/11/2020, biopsy confirmed  adenocarcinoma ? CTs 01/11/2020-rectal thickening, small perirectal lymph nodes and external iliac nodes, moderate right pleural effusion ? MRI pelvis 01/27/2020-T3bN2b tumor at 7.1 cm from the internal anal sphincter, multiple mesorectal nodes, indistinct left pelvic sidewall node and enlarged bilateral external iliac nodes ? PET scan 02/11/2020-hypermetabolic rectosigmoid mass, hypermetabolic mesorectal and sigmoid mesocolon lymph nodes, left external iliac/obturator node with mild hypermetabolism, 18 mm left inguinal node with normal architecture and slight hypermetabolism ? Radiation and infusional 5-FU 02/21/2020-04/04/2020 2. Anemia 3. Chronic renal failure 4. Congestive heart failure-severe LVH 5. Diabetes 6. Gout 7. Peripheral neuropathy? 8.   Hypertension   Disposition: Craig West completed a course of neoadjuvant infusional 5-FU and radiation for treatment of locally advanced rectal cancer.  He has not seen Dr. Marcello Moores in follow-up.  We will arrange for an appointment with Dr. Marcello Moores to discuss the timing of surgery.  Craig West will be referred for restaging CTs prior to a return office visit in approximately 1 week.  I will present his case at the GI tumor conference.  We have asked social work to contact him to help with obtaining blood pressure medications.  Betsy Coder, MD  05/01/2020  11:25 AM

## 2020-05-01 NOTE — Progress Notes (Signed)
  Radiation Oncology         [phone]) [phone] ________________________________  Name: Craig West MRN: [id number]  Date of Service: 05/01/2020  DOB: 1965/05/21  Post Treatment Telephone Note  Diagnosis:   Stage IIIA, cT2xN1, adenocarcinoma of the distal rectum.  Interval Since Last Radiation:  4 weeks   02/21/20 - 04/04/20: The patient was treated to the pelvis to a dose of 45 Gy at 1.8 Gy per fraction. This was accomplished using a 3 field IMRT technique. The patient then received a boost to the tumor and adjacent high-risk regions for an additional 5.4 Gy at 1.8 gray per fraction. This was carried out using a coned-down 3 field approach, IMRT. The patient's total dose was 50.4 Gy. The patient received concurrent chemotherapy during the course of radiation treatment.  Narrative:  The patient was contacted today for routine follow-up. During treatment he did very well with radiotherapy and did not have significant desquamation. He reports he is doing well and bowels are back to a more normal. He's there today to meet with Dr. Benay Spice.  Impression/Plan: 1. Stage IIIA, cT2xN1, adenocarcinoma of the distal rectum. The patient has been doing well since completion of radiotherapy. We discussed that we would be happy to continue to follow him as needed, but he will also continue to follow up with Dr. Learta Codding in medical oncology.      Carola Rhine, PAC

## 2020-05-01 NOTE — Progress Notes (Signed)
Pt states he is unable to afford his next medication refill. Pt states his medication cost is low but he still is unable to afford any cost at this time. Will reach out to SW to investigate options.

## 2020-05-01 NOTE — Telephone Encounter (Signed)
Scheduled per 01/31 los, patient received updated calender.

## 2020-05-08 ENCOUNTER — Other Ambulatory Visit: Payer: Self-pay | Admitting: General Surgery

## 2020-05-08 DIAGNOSIS — C20 Malignant neoplasm of rectum: Secondary | ICD-10-CM

## 2020-05-09 ENCOUNTER — Ambulatory Visit (HOSPITAL_COMMUNITY): Payer: Medicaid Other

## 2020-05-09 ENCOUNTER — Ambulatory Visit (HOSPITAL_COMMUNITY)
Admission: RE | Admit: 2020-05-09 | Discharge: 2020-05-09 | Disposition: A | Discharge status: Home / Self Care | Payer: Medicaid Other | Source: Ambulatory Visit | Attending: Oncology | Admitting: Oncology

## 2020-05-09 ENCOUNTER — Other Ambulatory Visit: Payer: Self-pay

## 2020-05-09 DIAGNOSIS — C20 Malignant neoplasm of rectum: Secondary | ICD-10-CM | POA: Insufficient documentation

## 2020-05-10 ENCOUNTER — Ambulatory Visit (HOSPITAL_COMMUNITY): Payer: Medicaid Other

## 2020-05-12 ENCOUNTER — Inpatient Hospital Stay: Payer: Medicaid Other | Attending: Oncology | Admitting: Oncology

## 2020-05-12 ENCOUNTER — Encounter: Payer: Self-pay | Admitting: *Deleted

## 2020-05-12 ENCOUNTER — Encounter: Payer: Self-pay | Admitting: Oncology

## 2020-05-12 ENCOUNTER — Other Ambulatory Visit: Payer: Self-pay

## 2020-05-12 VITALS — BP 152/96 | HR 86 | Temp 98.6°F | Resp 18 | Ht 73.0 in | Wt 243.6 lb

## 2020-05-12 DIAGNOSIS — C20 Malignant neoplasm of rectum: Secondary | ICD-10-CM | POA: Diagnosis present

## 2020-05-12 DIAGNOSIS — N189 Chronic kidney disease, unspecified: Secondary | ICD-10-CM | POA: Insufficient documentation

## 2020-05-12 DIAGNOSIS — E1122 Type 2 diabetes mellitus with diabetic chronic kidney disease: Secondary | ICD-10-CM | POA: Insufficient documentation

## 2020-05-12 DIAGNOSIS — I129 Hypertensive chronic kidney disease with stage 1 through stage 4 chronic kidney disease, or unspecified chronic kidney disease: Secondary | ICD-10-CM | POA: Diagnosis not present

## 2020-05-12 DIAGNOSIS — M109 Gout, unspecified: Secondary | ICD-10-CM | POA: Diagnosis not present

## 2020-05-12 DIAGNOSIS — D649 Anemia, unspecified: Secondary | ICD-10-CM | POA: Insufficient documentation

## 2020-05-12 NOTE — Progress Notes (Signed)
Patient reports during office visit today that he is out of all his medications that he obtains at [facility]. He must pay $3 co-pay for each med and does not have this. Notified our [place] and she has left a message for CSW at [facility] for [place] [facility] to reach out to patient.

## 2020-05-12 NOTE — Progress Notes (Signed)
[place] OFFICE PROGRESS NOTE   Diagnosis: Rectal cancer  INTERVAL HISTORY:   Mr. Tabb returns as scheduled.  No complaint.  He saw Dr. Marcello Moores on 05/08/2020.  She does not feel he is a candidate for a temporary ileostomy.  Mr. Burgo is unwilling to accept a permanent colostomy.  He requested a second surgical opinion.  He is scheduled to see Dr. Judi Cong at [facility] next week. A restaging CT evaluation on 05/09/2020 revealed no evidence of disease progression.  There is a decrease in perirectal and sigmoid colon lymphadenopathy compared to the PET 02/11/2020.   Objective:  Vital signs in last 24 hours:  Blood pressure (!) 152/96, pulse 86, temperature 98.6 F (37 C), temperature source Tympanic, resp. rate 18, height 6\' 1"  (1.854 m), weight 243 lb 9.6 oz (110.5 kg), SpO2 100 %.    Lymphatics: No cervical, supraclavicular, axillary, or inguinal nodes Resp: Lungs clear bilaterally Cardio: Regular rate and rhythm GI: No hepatosplenomegaly, no mass, nontender Vascular: No leg edema   Lab Results:  Lab Results  Component Value Date   WBC 11.2 (H) 05/01/2020   HGB 12.6 (L) 05/01/2020   HCT 38.7 (L) 05/01/2020   MCV 92.4 05/01/2020   PLT 200 05/01/2020   NEUTROABS 8.9 (H) 05/01/2020    CMP  Lab Results  Component Value Date   NA 138 05/01/2020   K 4.6 05/01/2020   CL 106 05/01/2020   CO2 23 05/01/2020   GLUCOSE 121 (H) 05/01/2020   BUN 37 (H) 05/01/2020   CREATININE 3.33 (HH) 05/01/2020   CALCIUM 9.5 05/01/2020   PROT 8.3 (H) 05/01/2020   ALBUMIN 3.4 (L) 05/01/2020   AST 18 05/01/2020   ALT 14 05/01/2020   ALKPHOS 112 05/01/2020   BILITOT 0.7 05/01/2020   GFRNONAA 21 (L) 05/01/2020   GFRAA 15 (L) 01/03/2020    Lab Results  Component Value Date   CEA1 2.8 01/11/2020    No results found for: INR  Imaging:  CT ABDOMEN PELVIS WO CONTRAST  Result Date: 05/10/2020 CLINICAL DATA:  Follow-up colorectal carcinoma. Assess treatment response.  EXAM: CT CHEST, ABDOMEN AND PELVIS WITHOUT CONTRAST TECHNIQUE: Multidetector CT imaging of the chest, abdomen and pelvis was performed following the standard protocol without IV contrast. COMPARISON:  PET-CT on 02/11/2020 FINDINGS: CT CHEST FINDINGS Cardiovascular: No acute findings. Aortic and coronary atherosclerotic calcification noted. Mediastinum/Lymph Nodes: No masses or pathologically enlarged lymph nodes identified on this unenhanced exam. Lungs/Pleura: Stable small bilateral pleural effusions. No suspicious pulmonary nodules or masses identified. Mild atelectasis seen in the dependent lung bases, but no evidence of pulmonary infiltrate or consolidation. Musculoskeletal:  No suspicious bone lesions identified. CT ABDOMEN AND PELVIS FINDINGS Hepatobiliary: No masses visualized on this unenhanced exam. Hypertrophy of caudate and left lobes again noted, suspicious for cirrhosis. Gallbladder is unremarkable. No evidence of biliary ductal dilatation. Pancreas: No mass or inflammatory changes identified on this unenhanced exam. Spleen:  Within normal limits in size. Adrenals/Urinary Tract: No evidence of urolithiasis or hydronephrosis. Stable small low-attenuation left renal lesion, likely representing a small cyst. Unremarkable appearance of bladder. Stomach/Bowel: No evidence of obstruction, inflammatory process, or abnormal fluid collections. Normal appendix visualized. Diverticulosis is seen mainly involving the sigmoid colon, however there is no evidence of diverticulitis. Vascular/Lymphatic: Decreased lymphadenopathy is seen in the perirectal region and sigmoid mesocolon since prior exam. Index lymph node in the midline currently measures 9 mm on image 107/2, compared to 12 mm previously. No new or increased areas  of lymphadenopathy identified. No abdominal aortic aneurysm. Reproductive:  Stable mildly enlarged prostate. Other:  None. Musculoskeletal:  No suspicious bone lesions identified. IMPRESSION:  Decreased perirectal and sigmoid mesocolon lymphadenopathy since prior exam. No new or progressive sites of metastatic disease identified. Stable small bilateral pleural effusions. 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 Aortic Atherosclerosis (ICD10-I70.0). Electronically Signed   By: Marlaine Hind M.D.   On: 05/10/2020 08:10   CT CHEST WO CONTRAST  Result Date: 05/10/2020 CLINICAL DATA:  Follow-up colorectal carcinoma. Assess treatment response. EXAM: CT CHEST, ABDOMEN AND PELVIS WITHOUT CONTRAST TECHNIQUE: Multidetector CT imaging of the chest, abdomen and pelvis was performed following the standard protocol without IV contrast. COMPARISON:  PET-CT on 02/11/2020 FINDINGS: CT CHEST FINDINGS Cardiovascular: No acute findings. Aortic and coronary atherosclerotic calcification noted. Mediastinum/Lymph Nodes: No masses or pathologically enlarged lymph nodes identified on this unenhanced exam. Lungs/Pleura: Stable small bilateral pleural effusions. No suspicious pulmonary nodules or masses identified. Mild atelectasis seen in the dependent lung bases, but no evidence of pulmonary infiltrate or consolidation. Musculoskeletal:  No suspicious bone lesions identified. CT ABDOMEN AND PELVIS FINDINGS Hepatobiliary: No masses visualized on this unenhanced exam. Hypertrophy of caudate and left lobes again noted, suspicious for cirrhosis. Gallbladder is unremarkable. No evidence of biliary ductal dilatation. Pancreas: No mass or inflammatory changes identified on this unenhanced exam. Spleen:  Within normal limits in size. Adrenals/Urinary Tract: No evidence of urolithiasis or hydronephrosis. Stable small low-attenuation left renal lesion, likely representing a small cyst. Unremarkable appearance of bladder. Stomach/Bowel: No evidence of obstruction, inflammatory process, or abnormal fluid collections. Normal appendix visualized. Diverticulosis is seen mainly involving the sigmoid colon, however there is no evidence of diverticulitis.  Vascular/Lymphatic: Decreased lymphadenopathy is seen in the perirectal region and sigmoid mesocolon since prior exam. Index lymph node in the midline currently measures 9 mm on image 107/2, compared to 12 mm previously. No new or increased areas of lymphadenopathy identified. No abdominal aortic aneurysm. Reproductive:  Stable mildly enlarged prostate. Other:  None. Musculoskeletal:  No suspicious bone lesions identified. IMPRESSION: Decreased perirectal and sigmoid mesocolon lymphadenopathy since prior exam. No new or progressive sites of metastatic disease identified. Stable small bilateral pleural effusions. 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 Aortic Atherosclerosis (ICD10-I70.0). Electronically Signed   By: Marlaine Hind M.D.   On: 05/10/2020 08:10    Medications: I have reviewed the patient's current medications.   Assessment/Plan: 1. Rectal cancer-rectal mass at 5-6 cm from the anal verge on colonoscopy 01/11/2020, biopsy confirmed adenocarcinoma ? CTs 01/11/2020-rectal thickening, small perirectal lymph nodes and external iliac nodes, moderate right pleural effusion ? MRI pelvis 01/27/2020-[id number] tumor at 7.1 cm from the internal anal sphincter, multiple mesorectal nodes, indistinct left pelvic sidewall node and enlarged bilateral external iliac nodes ? PET scan 02/11/2020-hypermetabolic rectosigmoid mass, hypermetabolic mesorectal and sigmoid mesocolon lymph nodes, left external iliac/obturator node with mild hypermetabolism, 18 mm left inguinal node with normal architecture and slight hypermetabolism ? Radiation and infusional 5-FU 02/21/2020-04/04/2020 ? CTs 05/09/2020-decreased perirectal and sigmoid mesocolon lymphadenopathy, no evidence of disease progression 2. Anemia 3. Chronic renal failure 4. Congestive heart failure-severe LVH 5. Diabetes 6. Gout 7. Peripheral neuropathy? 8.   Hypertension     Disposition:  Mr Gortney completed neoadjuvant therapy for treatment of local advanced rectal  cancer.  There is no clinical or radiologic evidence of disease progression.  I recommend he proceed with surgery.  He is scheduled for a second surgical opinion at [facility] on 05/15/2020.  He will return for an office visit in 2 weeks.  Mr Nosal is not taking any of his medications.  He has been unable to afford medications.  We are contacting the social worker at the wellness clinic to see if there is help available.  Betsy Coder, MD  05/12/2020  12:23 PM

## 2020-05-13 ENCOUNTER — Ambulatory Visit (HOSPITAL_COMMUNITY): Admission: RE | Admit: 2020-05-13 | Payer: Medicaid Other | Source: Ambulatory Visit

## 2020-05-15 ENCOUNTER — Telehealth: Payer: Self-pay | Admitting: Oncology

## 2020-05-15 NOTE — Telephone Encounter (Signed)
Scheduled appointment per 2/11 los. Called patient, no answer. No voicemail set up. Will mail updated calendar to patient.

## 2020-05-16 ENCOUNTER — Ambulatory Visit (HOSPITAL_COMMUNITY)
Admission: RE | Admit: 2020-05-16 | Discharge: 2020-05-16 | Disposition: A | Discharge status: Home / Self Care | Payer: Medicaid Other | Source: Ambulatory Visit | Attending: General Surgery | Admitting: General Surgery

## 2020-05-16 ENCOUNTER — Other Ambulatory Visit: Payer: Self-pay

## 2020-05-16 ENCOUNTER — Telehealth: Payer: Self-pay

## 2020-05-16 ENCOUNTER — Encounter: Payer: Self-pay | Admitting: General Practice

## 2020-05-16 DIAGNOSIS — C20 Malignant neoplasm of rectum: Secondary | ICD-10-CM | POA: Insufficient documentation

## 2020-05-16 NOTE — Progress Notes (Signed)
[facility] CSW Progress Notes  MEssage from RN Gas for [facility].  She confirms that patient has insurance, has $3 copay for medications and that copay can be charged to his account and paid off when he has funds available.  Relayed [facility] RN concern that patient is not taking his antihypertensive medications so this could be addressed by PCP at next visit.   Edwyna Shell, LCSW Clinical Social Worker Phone:  [phone]

## 2020-05-16 NOTE — Telephone Encounter (Signed)
At request of Edwyna Shell, LCSW attempted to contact patient to explain that he can charge his medications to an account at [facility] and he can then pay off the balance when he is able  Call placed to # [phone], unable to leave a message, voicemail not set up. Mar[facility] Kitchen Update provided to Edwyna Shell, LCSW

## 2020-05-17 ENCOUNTER — Other Ambulatory Visit: Payer: Self-pay

## 2020-05-17 NOTE — Progress Notes (Signed)
The proposed treatment discussed in conference is for discussion purposes only and is not a binding recommendation.  The patients have not been physically examined, or presented with their treatment options.  Therefore, final treatment plans cannot be decided.   

## 2020-05-21 ENCOUNTER — Other Ambulatory Visit: Payer: Self-pay

## 2020-05-21 ENCOUNTER — Encounter (HOSPITAL_COMMUNITY): Payer: Self-pay | Admitting: Emergency Medicine

## 2020-05-21 ENCOUNTER — Emergency Department (HOSPITAL_COMMUNITY)
Admission: EM | Admit: 2020-05-21 | Discharge: 2020-05-21 | Disposition: A | Discharge status: Home / Self Care | Payer: Medicaid Other | Attending: Emergency Medicine | Admitting: Emergency Medicine

## 2020-05-21 ENCOUNTER — Emergency Department (HOSPITAL_COMMUNITY): Payer: Medicaid Other

## 2020-05-21 DIAGNOSIS — Z8616 Personal history of COVID-19: Secondary | ICD-10-CM | POA: Insufficient documentation

## 2020-05-21 DIAGNOSIS — E1122 Type 2 diabetes mellitus with diabetic chronic kidney disease: Secondary | ICD-10-CM | POA: Diagnosis not present

## 2020-05-21 DIAGNOSIS — I160 Hypertensive urgency: Secondary | ICD-10-CM | POA: Insufficient documentation

## 2020-05-21 DIAGNOSIS — N185 Chronic kidney disease, stage 5: Secondary | ICD-10-CM | POA: Diagnosis not present

## 2020-05-21 DIAGNOSIS — J45901 Unspecified asthma with (acute) exacerbation: Secondary | ICD-10-CM

## 2020-05-21 DIAGNOSIS — J4521 Mild intermittent asthma with (acute) exacerbation: Secondary | ICD-10-CM | POA: Diagnosis not present

## 2020-05-21 DIAGNOSIS — Z85048 Personal history of other malignant neoplasm of rectum, rectosigmoid junction, and anus: Secondary | ICD-10-CM | POA: Insufficient documentation

## 2020-05-21 DIAGNOSIS — I5031 Acute diastolic (congestive) heart failure: Secondary | ICD-10-CM | POA: Diagnosis not present

## 2020-05-21 DIAGNOSIS — Z87891 Personal history of nicotine dependence: Secondary | ICD-10-CM | POA: Diagnosis not present

## 2020-05-21 DIAGNOSIS — R0602 Shortness of breath: Secondary | ICD-10-CM | POA: Diagnosis present

## 2020-05-21 DIAGNOSIS — Z79899 Other long term (current) drug therapy: Secondary | ICD-10-CM | POA: Insufficient documentation

## 2020-05-21 DIAGNOSIS — Z85038 Personal history of other malignant neoplasm of large intestine: Secondary | ICD-10-CM | POA: Diagnosis not present

## 2020-05-21 DIAGNOSIS — Z7951 Long term (current) use of inhaled steroids: Secondary | ICD-10-CM | POA: Insufficient documentation

## 2020-05-21 DIAGNOSIS — I132 Hypertensive heart and chronic kidney disease with heart failure and with stage 5 chronic kidney disease, or end stage renal disease: Secondary | ICD-10-CM | POA: Diagnosis not present

## 2020-05-21 DIAGNOSIS — I509 Heart failure, unspecified: Secondary | ICD-10-CM

## 2020-05-21 LAB — CBC
HCT: 40.7 % (ref 39.0–52.0)
Hemoglobin: 12.6 g/dL — ABNORMAL LOW (ref 13.0–17.0)
MCH: 29.9 pg (ref 26.0–34.0)
MCHC: 31 g/dL (ref 30.0–36.0)
MCV: 96.4 fL (ref 80.0–100.0)
Platelets: 209 10*3/uL (ref 150–400)
RBC: 4.22 MIL/uL (ref 4.22–5.81)
RDW: 16 % — ABNORMAL HIGH (ref 11.5–15.5)
WBC: 9.9 10*3/uL (ref 4.0–10.5)
nRBC: 0 % (ref 0.0–0.2)

## 2020-05-21 LAB — BRAIN NATRIURETIC PEPTIDE: B Natriuretic Peptide: 791 pg/mL — ABNORMAL HIGH (ref 0.0–100.0)

## 2020-05-21 LAB — BASIC METABOLIC PANEL
Anion gap: 12 (ref 5–15)
BUN: 33 mg/dL — ABNORMAL HIGH (ref 6–20)
CO2: 23 mmol/L (ref 22–32)
Calcium: 8.1 mg/dL — ABNORMAL LOW (ref 8.9–10.3)
Chloride: 103 mmol/L (ref 98–111)
Creatinine, Ser: 3.2 mg/dL — ABNORMAL HIGH (ref 0.61–1.24)
GFR, Estimated: 22 mL/min — ABNORMAL LOW (ref >60)
Glucose, Bld: 163 mg/dL — ABNORMAL HIGH (ref 70–99)
Potassium: 4.4 mmol/L (ref 3.5–5.1)
Sodium: 138 mmol/L (ref 135–145)

## 2020-05-21 LAB — TROPONIN I (HIGH SENSITIVITY)
Troponin I (High Sensitivity): 136 ng/L (ref <18)
Troponin I (High Sensitivity): 129 ng/L (ref <18)

## 2020-05-21 MED ORDER — HYDRALAZINE HCL 25 MG PO TABS
50.0000 mg | ORAL_TABLET | Freq: Three times a day (TID) | ORAL | Status: DC
Start: 2020-05-21 — End: 2020-05-21
  Administered 2020-05-21: 50 mg via ORAL
  Filled 2020-05-21: qty 2

## 2020-05-21 MED ORDER — FUROSEMIDE 10 MG/ML IJ SOLN: 60.0000 mg | Freq: Once | INTRAMUSCULAR | Status: DC

## 2020-05-21 MED ORDER — ALBUTEROL SULFATE HFA 108 (90 BASE) MCG/ACT IN AERS
1.0000 | INHALATION_SPRAY | Freq: Once | RESPIRATORY_TRACT | Status: AC
  Administered 2020-05-21: 2 via RESPIRATORY_TRACT
  Filled 2020-05-21: qty 6.7

## 2020-05-21 MED ORDER — ALBUTEROL SULFATE HFA 108 (90 BASE) MCG/ACT IN AERS
4.0000 | INHALATION_SPRAY | Freq: Once | RESPIRATORY_TRACT | Status: AC
  Administered 2020-05-21: 4 via RESPIRATORY_TRACT

## 2020-05-21 MED ORDER — ALBUTEROL SULFATE HFA 108 (90 BASE) MCG/ACT IN AERS: 2.0000 | INHALATION_SPRAY | Freq: Once | RESPIRATORY_TRACT | Status: DC

## 2020-05-21 MED ORDER — ISOSORBIDE MONONITRATE ER 30 MG PO TB24
60.0000 mg | ORAL_TABLET | Freq: Every day | ORAL | Status: DC
  Administered 2020-05-21: 60 mg via ORAL
  Filled 2020-05-21: qty 2

## 2020-05-21 MED ORDER — CARVEDILOL 3.125 MG PO TABS
12.5000 mg | ORAL_TABLET | Freq: Two times a day (BID) | ORAL | Status: DC
  Administered 2020-05-21: 12.5 mg via ORAL
  Filled 2020-05-21: qty 4

## 2020-05-21 MED ORDER — AMLODIPINE BESYLATE 5 MG PO TABS
10.0000 mg | ORAL_TABLET | Freq: Every day | ORAL | Status: DC
  Administered 2020-05-21: 10 mg via ORAL
  Filled 2020-05-21: qty 2

## 2020-05-21 MED ORDER — TAMSULOSIN HCL 0.4 MG PO CAPS
0.4000 mg | ORAL_CAPSULE | Freq: Every day | ORAL | Status: DC
  Administered 2020-05-21: 0.4 mg via ORAL
  Filled 2020-05-21: qty 1

## 2020-05-21 MED ORDER — FUROSEMIDE 20 MG PO TABS
80.0000 mg | ORAL_TABLET | Freq: Once | ORAL | Status: AC
  Administered 2020-05-21: 80 mg via ORAL
  Filled 2020-05-21: qty 4

## 2020-05-21 NOTE — ED Notes (Signed)
Pt not wanting any testing.  States he just wants a breathing treatment.  Explained to pt the reasoning for test and he agreed.

## 2020-05-21 NOTE — ED Notes (Signed)
Pt d/c home per MD order. Discharge summary reviewed with pt, pt verbalizes understanding. Ambulatory. No s/s of acute distress noted at discharge  ?

## 2020-05-21 NOTE — Care Management (Addendum)
ED RNCM also sent message to the [facility] Director [phone]) to assist with obtaining patient's meds

## 2020-05-21 NOTE — ED Notes (Addendum)
Dr. Rex Kras and charge RN notified of Trop 136.  No orders received.  Pt prioritized for treatment room.

## 2020-05-21 NOTE — ED Provider Notes (Signed)
Patient signed out to me is pending resumption of his medications here in the ER.  Prior physician contacted social worker and arranged patient to receive his prescription medications tomorrow at his health center.  Review of his previous labs showed a persistently elevated troponin.  Patient has no complaints of chest pain or chest discomfort.  EKG unchanged from prior EKGs.  Vital signs appear improved.  Patient declines further observation stay.  Advised him to continue taking his medications from his outpatient facility tomorrow, advised me to return for worsening symptoms fevers pain or any additional concerns.   Luna Fuse, MD 05/21/20 [phone]

## 2020-05-21 NOTE — ED Triage Notes (Signed)
Pt reports SOB since 7am.  O2 sats 100% on arrival.  Denies pain.  Pt not wanting to answer triage questions on arrival and states he is only here to get an inhaler.  States he can't afford inhaler.

## 2020-05-21 NOTE — Care Management (Addendum)
ED  RNCM received TOC consult concerning medication assistance.  Reviewed record patient has Medicaid, and note from the [facility] at the  [facility] concerning she attempted to get in touch with patient phone not accepting calls, to let him know that his co-pay can be waived and placed on an account to pay at a later time. ED RNCM forwarded a message to Clinic's RNCM to assist patient with this matter obtaining his medications.  Patient has been instructed to walk in to the [facility] tomorrow morning to be assisted with getting his meds. Updated the EDP.  ED CM will follow up tomorrow.

## 2020-05-21 NOTE — ED Provider Notes (Cosign Needed Addendum)
[facility] EMERGENCY DEPARTMENT Provider Note   CSN: [id number] Arrival date & time: 05/21/20  1038   Chief Complaint: Shortness of Breath  History Chief Complaint  Patient presents with  . Shortness of Breath    Craig West is a 55 y.o. male.  HPI   [name] is a 55 y.o. gentleman w/ PMHx significant for asthma, HFpEF, Essential HTN, DM type II and CKD stage V, presenting with chief complaint of shortness of breath that began acutely at 7am this morning that has since worsened. He endorses associated mild dry cough, wheezing, and chest tightness. Believes his symptoms are more consistent with prior asthma exacerbations he's had rather than CHF, although he notes his blood pressure has also been elevated as he has been out of all of his medications for about 2 weeks as he has been unable to afford them. Denies any CP, headache, vision changes, weakness, numbness, fevers, chills, sick contacts.   Past Medical History:  Diagnosis Date  . Asthma   . CHF (congestive heart failure) ([place])   . CKD (chronic kidney disease), stage IV ([place])   . COVID-19 virus infection 04/2019  . Diabetes mellitus without complication ([place])   . Gout   . Hypertension   . Rectal cancer [facility])     Patient Active Problem List   Diagnosis Date Noted  . Diabetes mellitus without complication ([place])   . Hypertension   . CKD (chronic kidney disease), stage IV ([place])   . PICC (peripherally inserted central catheter) in place 03/27/2020  . Rectal cancer ([place]) 01/27/2020  . Benign neoplasm of ascending colon   . Benign neoplasm of descending colon   . Benign neoplasm of sigmoid colon   . Rectal mass   . Rectal bleeding   . Iron deficiency anemia   . Acute on chronic congestive heart failure ([place])   . Hyperkalemia   . Volume overload 01/06/2020  . Acute diastolic heart failure ([place])   . AKI (acute kidney injury) ([place])   . Demand ischemia ([place])   . Hypertensive urgency 12/23/2019  .  COVID-19 virus infection 04/2019  . Obesity (BMI 30.0-34.9) 12/09/2017  . Non compliance w medication regimen 04/18/2017  . Gout 08/02/2016  . Diabetic neuropathy ([place]) 08/02/2016  . CKD (chronic kidney disease), stage V ([place]) 08/02/2016  . Hyperlipidemia 10/24/2015  . Asthma 10/16/2015  . Type 2 diabetes mellitus without complication ([place]) 99/24/2683  . Essential hypertension 06/13/2014  . Smoker 06/13/2014    Past Surgical History:  Procedure Laterality Date  . BIOPSY  01/11/2020   Procedure: BIOPSY;  Surgeon: Irene Shipper, MD;  Location: [facility] ENDOSCOPY;  Service: Endoscopy;;  . COLONOSCOPY WITH PROPOFOL N/A 01/11/2020   Procedure: COLONOSCOPY WITH PROPOFOL;  Surgeon: Irene Shipper, MD;  Location: [facility];  Service: Endoscopy;  Laterality: N/A;  . NO PAST SURGERIES    . POLYPECTOMY  01/11/2020   Procedure: POLYPECTOMY;  Surgeon: Irene Shipper, MD;  Location: [facility] ENDOSCOPY;  Service: Endoscopy;;  . SUBMUCOSAL TATTOO INJECTION  01/11/2020   Procedure: SUBMUCOSAL TATTOO INJECTION;  Surgeon: Irene Shipper, MD;  Location: [facility] ENDOSCOPY;  Service: Endoscopy;;       Family History  Problem Relation Age of Onset  . Heart failure Brother     Social History   Tobacco Use  . Smoking status: Former Smoker    Packs/day: 0.50    Types: Cigarettes    Quit date: 10/27/2019    Years since quitting: 0.5  .  Smokeless tobacco: Never Used  Vaping Use  . Vaping Use: Never used  Substance Use Topics  . Alcohol use: Yes    Alcohol/week: 0.0 standard drinks    Comment: occ  . Drug use: No    Home Medications Prior to Admission medications   Medication Sig Start Date End Date Taking? Authorizing Provider  albuterol (VENTOLIN HFA) 108 (90 Base) MCG/ACT inhaler Inhale 2 puffs into the lungs every 6 (six) hours as needed for wheezing or shortness of breath. 12/27/19  Yes Andrew Au, MD  allopurinol (ZYLOPRIM) 100 MG tablet Take 200 mg by mouth daily.    [provider]   amLODipine (NORVASC) 5 MG tablet Take 2 tablets (10 mg total) by mouth daily. 04/05/20 05/05/20  Argentina Donovan, PA-C  atorvastatin (LIPITOR) 40 MG tablet Take 1 tablet (40 mg total) by mouth daily. For hypercholesterolemia 04/05/20   Argentina Donovan, PA-C  Blood Glucose Monitoring Suppl (TRUE METRIX METER) w/Device KIT Check blood sugar fasting and ant bedtime and record 05/15/17   Charlott Rakes, MD  carvedilol (COREG) 12.5 MG tablet Take 1 tablet (12.5 mg total) by mouth 2 (two) times daily with a meal. 04/05/20   McClung, Dionne Bucy, PA-C  colchicine 0.6 MG tablet Take 2 tablets (1.2 mg) orally at the onset of a gout attack; may repeat 1 tablet (0.6 mg) in 2 hours if symptoms persist 12/27/19   Andrew Au, MD  furosemide (LASIX) 20 MG tablet Take 3 tablets (60 mg total) by mouth 2 (two) times daily. 04/05/20   Argentina Donovan, PA-C  gabapentin (NEURONTIN) 300 MG capsule Take 1 capsule (300 mg total) by mouth 2 (two) times daily. 04/05/20   Argentina Donovan, PA-C  glipiZIDE (GLUCOTROL) 5 MG tablet Take 1 tablet (5 mg total) by mouth 2 (two) times daily before a meal. 04/05/20   McClung, Dionne Bucy, PA-C  glucose blood (TRUE METRIX BLOOD GLUCOSE TEST) test strip Use as instructed 12/27/19   Andrew Au, MD  hydrALAZINE (APRESOLINE) 50 MG tablet Take 1 tablet (50 mg total) by mouth 3 (three) times daily. 04/05/20   Argentina Donovan, PA-C  isosorbide mononitrate (IMDUR) 60 MG 24 hr tablet Take 1 tablet (60 mg total) by mouth daily. 04/05/20   Argentina Donovan, PA-C  Misc. Devices (DIGITAL GLASS SCALE) MISC Use scale to weight yourself. Increasing weight may indicate fluid overload 12/27/19   Andrew Au, MD  prochlorperazine (COMPAZINE) 10 MG tablet Take 1 tablet (10 mg total) by mouth every 6 (six) hours as needed for nausea. 02/04/20   Ladell Pier, MD  tamsulosin (FLOMAX) 0.4 MG CAPS capsule Take 1 capsule (0.4 mg total) by mouth daily. 04/05/20   Argentina Donovan, PA-C  TRUEplus Lancets 28G MISC  Check blood sugar fasting and at bedtime 12/27/19   Andrew Au, MD    Allergies    Patient has no known allergies.  Review of Systems   Review of Systems   All others negative except as noted above in HPI.   Physical Exam Updated Vital Signs BP (!) 157/114   Pulse 80   Temp 98.3 F (36.8 C)   Resp 16   SpO2 95%   Physical Exam   General: Patient appears well. No acute distress. Eyes: Sclera non-icteric. No conjunctival injection.  HENT: Moist mucus membranes. No nasal discharge. Respiratory: There are mild expiratory wheezes throughout all lung fields. No rales or rhonchi. No tachypnea or accessory muscle  use with breathing, saturating in the high 90's on room air.  Cardiovascular: Regular rate and rhythm. No murmurs, rubs, or gallops. No lower extremity edema. Abdominal: Obese. Soft and non-tender to palpation. Bowel sounds intact. No rebound or guarding. Skin: No lesions. No rashes.  Psych: Normal affect. Normal tone of voice.   ED Results / Procedures / Treatments   Labs (all labs ordered are listed, but only abnormal results are displayed) Labs Reviewed  BASIC METABOLIC PANEL - Abnormal; Notable for the following components:      Result Value   Glucose, Bld 163 (*)    BUN 33 (*)    Creatinine, Ser 3.20 (*)    Calcium 8.1 (*)    GFR, Estimated 22 (*)    All other components within normal limits  CBC - Abnormal; Notable for the following components:   Hemoglobin 12.6 (*)    RDW 16.0 (*)    All other components within normal limits  TROPONIN I (HIGH SENSITIVITY) - Abnormal; Notable for the following components:   Troponin I (High Sensitivity) 136 (*)    All other components within normal limits  TROPONIN I (HIGH SENSITIVITY) - Abnormal; Notable for the following components:   Troponin I (High Sensitivity) 129 (*)    All other components within normal limits  BRAIN NATRIURETIC PEPTIDE    EKG EKG Interpretation  Date/Time:  Sunday May 21 2020 10:59:48  EST Ventricular Rate:  80 PR Interval:  150 QRS Duration: 114 QT Interval:  422 QTC Calculation: 486 R Axis:   101 Text Interpretation: Normal sinus rhythm with sinus arrhythmia Right bundle branch block Septal infarct , age undetermined Abnormal ECG some artifact but overall similar to previous Confirmed by Theotis Burrow [phone]) on 05/21/2020 12:58:33 PM   Radiology DG Chest 2 View  Result Date: 05/21/2020 CLINICAL DATA:  Short of breath. EXAM: CHEST - 2 VIEW COMPARISON:  05/09/2020 FINDINGS: Mild cardiac enlargement. Blunting of the costophrenic angles noted bilaterally compatible with small pleural effusions. Mild edema is noted. No airspace consolidation or atelectasis. Multi level spondylosis noted within the thoracic spine. IMPRESSION: 1. Mild CHF. 2. Thoracic spondylosis. Electronically Signed   By: Kerby Moors M.D.   On: 05/21/2020 11:43    Procedures Procedures   Medications Ordered in ED Medications  amLODipine (NORVASC) tablet 10 mg (10 mg Oral Given 05/21/20 1420)  carvedilol (COREG) tablet 12.5 mg (12.5 mg Oral Given 05/21/20 1420)  hydrALAZINE (APRESOLINE) tablet 50 mg (50 mg Oral Given 05/21/20 1421)  isosorbide mononitrate (IMDUR) 24 hr tablet 60 mg (60 mg Oral Given 05/21/20 1533)  tamsulosin (FLOMAX) capsule 0.4 mg (0.4 mg Oral Given 05/21/20 1421)  albuterol (VENTOLIN HFA) 108 (90 Base) MCG/ACT inhaler 1-2 puff (2 puffs Inhalation Given 05/21/20 1359)  furosemide (LASIX) tablet 80 mg (80 mg Oral Given 05/21/20 1533)  albuterol (VENTOLIN HFA) 108 (90 Base) MCG/ACT inhaler 4 puff (4 puffs Inhalation Given 05/21/20 1534)    ED Course  I have reviewed the triage vital signs and the nursing notes.  Pertinent labs & imaging results that were available during my care of the patient were reviewed by me and considered in my medical decision making (see chart for details).    MDM Rules/Calculators/A&P                          Assessment:   Mr. Bello is a 55 y.o. M w/  history of asthma, HFpEF, HTN and CKD stage V presenting  with acute-onset SOB, wheezing, and chest tightness as well as hypertension to 221/160. Does have expiratory wheezing on exam although is saturating very well on room air without accessory muscle use. Findings are more consistent with acute asthma exacerbation with small component of HF contribution with mild edema and mild cardiac enlargement on CXR, although patient does not have JVD, rales, or obvious edema to suggest any severe exacerbation. Found to have severe HTN in the setting of running out of all home medications including amlodipine, COREG, Hydralazine, Imdur, furosemide (and albuterol) due to cost.   Results:   Creatinine of 3.20, GFR of 22 near patient's baseline without evidence of AKI. Mild normocytic anemia is stable. Troponin elevated to 136 although is lower than historical troponin's at the time of HTN emergency with CHF exacerbation. EKG does not show acute changes to suggest ACS and patient denies any CP or neurological symptoms to suggest HTN emergency.   Plan:   - For asthma exacerbation, will give additional 4 puff albuterol given he continues to have some mild wheezing and tachypnea after initial 2 puffs albuterol given on presentation; supply O2 as needed to keep SpO2 >94% although patient continues to saturate well on room air - For HTN urgency, will place on monitoring and repeat troponin to r/o ACS (although less likely given relatively unremarkable EKG) vs. HTN-induced cardiac injury  - Will restart all home antihypertensives. Will hold off on anti-hypertensive gtt for now in anticipation BP will improve after home medications are restarted  - Will consult TOC for medication assistance   Spoke with Mariann Laster from [facility] who states that she spoke from [place], Tourist information centre manager at [employer], who states that patient's medications have already been re-ordered and are waiting for patient to pick them up. Notified  patient that he should be able to pick these up at little to no cost following his discharge.   Update as of 3:34pm: Second Troponin improved from 136 to 127. Ordered a BNP which is pending. Blood pressure has significantly improved down to 157/114. If BNP is similar or better than priors, should be stable for discharge with outpatient follow up and medications from [facility].   Final Clinical Impression(s) / ED Diagnoses Final diagnoses:  Mild asthma with exacerbation, unspecified whether persistent  Acute on chronic congestive heart failure, unspecified heart failure type [facility])  Hypertensive urgency    Rx / DC Orders ED Discharge Orders    None     Jeralyn Bennett, MD 05/21/2020, 3:39 PM Pager: [phone]    Jeralyn Bennett, MD 05/21/20 [place], Wenda Overland, MD 05/23/20 2000

## 2020-05-21 NOTE — Discharge Instructions (Signed)
Go to the [facility] center tomorrow to pick up your medications.  Return if you have fevers, pain, worsening symptoms or any other concerns.

## 2020-05-22 ENCOUNTER — Telehealth: Payer: Self-pay | Admitting: *Deleted

## 2020-05-22 ENCOUNTER — Telehealth: Payer: Self-pay

## 2020-05-22 MED FILL — CARVEDILOL 12.5 MG TABLET: 12.5 | 30 days supply | Qty: 60 | Fill #0

## 2020-05-22 MED FILL — FUROSEMIDE 20 MG TABS: 20 | 30 days supply | Qty: 180 | Fill #0

## 2020-05-22 MED FILL — AMLODIPINE BESYLATE 5 MG TA: 5 | 30 days supply | Qty: 60 | Fill #0

## 2020-05-22 MED FILL — TAMSULOSIN HCL 0.4 MG CAP: 0.4 | 30 days supply | Qty: 30 | Fill #0

## 2020-05-22 MED FILL — GABAPENTIN 300 MG CAPSULE: 300 | 30 days supply | Qty: 60 | Fill #0

## 2020-05-22 MED FILL — ISOSORBIDE MN ER 60 MG TAB: 60 | 30 days supply | Qty: 30 | Fill #0

## 2020-05-22 MED FILL — hydrALAZINE HCL 50 MG TABS: 50 | 30 days supply | Qty: 90 | Fill #0

## 2020-05-22 MED FILL — glipiZIDE 5 MG TABS: 5 | 30 days supply | Qty: 60 | Fill #0

## 2020-05-22 MED FILL — ATORVASTATIN CALCIUM 40 MG: 40 | 30 days supply | Qty: 30 | Fill #0

## 2020-05-22 NOTE — Telephone Encounter (Signed)
Transition Care Management Follow-up Telephone Call  Date of discharge and from where: 05/21/20 [facility]  How have you been since you were released from the hospital? "about the same"  Any questions or concerns? No  Items Reviewed:  Did the pt receive and understand the discharge instructions provided? Yes   Medications obtained and verified? Yes   Other? N/A  Any new allergies since your discharge? No   Dietary orders reviewed? Yes  Do you have support at home? Yes   Home Care and Equipment/Supplies: Were home health services ordered? not applicable If so, what is the name of the agency? N/A  Has the agency set up a time to come to the patient's home? not applicable Were any new equipment or medical supplies ordered?  No What is the name of the medical supply agency? N/A Were you able to get the supplies/equipment? not applicable Do you have any questions related to the use of the equipment or supplies? No  Functional Questionnaire: (I = Independent and D = Dependent) ADLs: I  Bathing/Dressing- I  Meal Prep- I  Eating- I  Maintaining continence- I  Transferring/Ambulation- I  Managing Meds- I  Follow up appointments reviewed:   PCP Hospital f/u appt confirmed? Yes  Scheduled to see Dr. Margarita Rana on 05/25/2020 @ 0830.  [facility] f/u appt confirmed? N/A   Are transportation arrangements needed? No   If their condition worsens, is the pt aware to call PCP or go to the Emergency Dept.? Yes  Was the patient provided with contact information for the PCP's office or ED? Yes  Was to pt encouraged to call back with questions or concerns? Yes

## 2020-05-22 NOTE — Telephone Encounter (Signed)
The patient came to the clinic this afternoon and picked up at least 8 medications. He has not picked up medications in awhile.    Per Stann Mainland, [facility] Pharmacy Tech, the patient understands that he can charge the medication co-pays.  He already has an account balance of $500 and that does not include today's charges.  He paid $1.00 towards the balance today. Balances are supposed to be limited to $100.

## 2020-05-25 ENCOUNTER — Other Ambulatory Visit: Payer: Self-pay | Admitting: Family Medicine

## 2020-05-25 ENCOUNTER — Ambulatory Visit: Payer: Medicaid Other | Attending: Family Medicine | Admitting: Family Medicine

## 2020-05-25 ENCOUNTER — Other Ambulatory Visit: Payer: Self-pay

## 2020-05-25 VITALS — BP 148/94 | HR 78 | Ht 73.0 in | Wt 243.0 lb

## 2020-05-25 DIAGNOSIS — I5032 Chronic diastolic (congestive) heart failure: Secondary | ICD-10-CM

## 2020-05-25 DIAGNOSIS — N184 Chronic kidney disease, stage 4 (severe): Secondary | ICD-10-CM | POA: Diagnosis not present

## 2020-05-25 DIAGNOSIS — C20 Malignant neoplasm of rectum: Secondary | ICD-10-CM

## 2020-05-25 DIAGNOSIS — E1122 Type 2 diabetes mellitus with diabetic chronic kidney disease: Secondary | ICD-10-CM

## 2020-05-25 DIAGNOSIS — I110 Hypertensive heart disease with heart failure: Secondary | ICD-10-CM

## 2020-05-25 DIAGNOSIS — I129 Hypertensive chronic kidney disease with stage 1 through stage 4 chronic kidney disease, or unspecified chronic kidney disease: Secondary | ICD-10-CM

## 2020-05-25 DIAGNOSIS — R399 Unspecified symptoms and signs involving the genitourinary system: Secondary | ICD-10-CM | POA: Diagnosis not present

## 2020-05-25 LAB — POCT GLYCOSYLATED HEMOGLOBIN (HGB A1C): HbA1c, POC (controlled diabetic range): 5.6 % (ref 0.0–7.0)

## 2020-05-25 LAB — GLUCOSE, POCT (MANUAL RESULT ENTRY): POC Glucose: 114 mg/dl — AB (ref 70–99)

## 2020-05-25 MED ORDER — FUROSEMIDE 20 MG PO TABS
60.0000 mg | ORAL_TABLET | Freq: Two times a day (BID) | ORAL | 2 refills | Status: DC
Start: 2020-05-25 — End: 2020-05-25

## 2020-05-25 MED ORDER — AMLODIPINE BESYLATE 5 MG PO TABS: 10.0000 mg | ORAL_TABLET | Freq: Every day | ORAL | 6 refills | Status: DC

## 2020-05-25 MED ORDER — ATORVASTATIN CALCIUM 40 MG PO TABS: 40.0000 mg | ORAL_TABLET | Freq: Every day | ORAL | 6 refills | Status: DC

## 2020-05-25 MED ORDER — TAMSULOSIN HCL 0.4 MG PO CAPS: 0.4000 mg | ORAL_CAPSULE | Freq: Every day | ORAL | 6 refills | Status: DC

## 2020-05-25 MED ORDER — CARVEDILOL 12.5 MG PO TABS: 12.5000 mg | ORAL_TABLET | Freq: Two times a day (BID) | ORAL | 6 refills | Status: DC

## 2020-05-25 MED ORDER — HYDRALAZINE HCL 50 MG PO TABS
50.0000 mg | ORAL_TABLET | Freq: Three times a day (TID) | ORAL | 6 refills | Status: DC
Start: 2020-05-25 — End: 2020-05-25

## 2020-05-25 MED ORDER — ISOSORBIDE MONONITRATE ER 60 MG PO TB24: 60.0000 mg | ORAL_TABLET | Freq: Every day | ORAL | 6 refills | Status: DC

## 2020-05-25 MED ORDER — GLIPIZIDE 5 MG PO TABS
2.5000 mg | ORAL_TABLET | Freq: Every day | ORAL | 6 refills | Status: DC
Start: 2020-05-25 — End: 2020-05-25

## 2020-05-25 NOTE — Progress Notes (Signed)
Subjective:  Patient ID: Craig West, male    DOB: 1966-01-16  Age: 55 y.o. MRN: [id number]  CC: Diabetes   HPI Bryston Colocho  is a 55 year old male with a history of hypertension, type 2 diabetes mellitus (A1c6.4), asthma, gout,stage IV chronic kidney disease, Hypertensive Heart Disease, Rectal carcinoma diagnosed in 12/2018 (completed chemotherapy and radiation) here for a follow up visit.  Seen by [facility] and sought a second opinion from [facility]. Proctectomy with End Colostomy recommended. He would not like to have a Colostomy bag and that is affecting his decision to proceed with Surgery. He has an upcoming appointment at the cancer center. MRI pelvis w/o contrast 05/17/20: IMPRESSION: 1. Response to therapy of rectal primary, without well-defined residual lesion identified. 2. Persistent perirectal adenopathy, similar to most recent CT 05/10/2020 but improved compared to PET-CT of 02/11/2020.  CT chest 05/10/20: IMPRESSION: Decreased perirectal and sigmoid mesocolon lymphadenopathy since prior exam. No new or progressive sites of metastatic disease identified.  Stable small bilateral pleural effusions.  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  Aortic Atherosclerosis (ICD10-I70.0). Emotionally he is doing okay. States he is not depressed. Compliant with his Glipizide and denies hypoglycemic episodes. With regards to his CKD, I had referred him to Nephrology several times but he never made it there due to lack of insurance. He had also been  Referred to Cardiology due to CHF and echo findings suspicious for Cardiac Amyloidosis but he has not had a visit in a while. Past Medical History:  Diagnosis Date  . Asthma   . CHF (congestive heart failure) ([place])   . CKD (chronic kidney disease), stage IV ([place])   . COVID-19 virus infection 04/2019  . Diabetes mellitus without complication ([place])   . Gout   . Hypertension   . Rectal cancer [facility])      Past Surgical History:  Procedure Laterality Date  . BIOPSY  01/11/2020   Procedure: BIOPSY;  Surgeon: Irene Shipper, MD;  Location: [facility] ENDOSCOPY;  Service: Endoscopy;;  . COLONOSCOPY WITH PROPOFOL N/A 01/11/2020   Procedure: COLONOSCOPY WITH PROPOFOL;  Surgeon: Irene Shipper, MD;  Location: [place];  Service: Endoscopy;  Laterality: N/A;  . NO PAST SURGERIES    . POLYPECTOMY  01/11/2020   Procedure: POLYPECTOMY;  Surgeon: Irene Shipper, MD;  Location: [facility] ENDOSCOPY;  Service: Endoscopy;;  . SUBMUCOSAL TATTOO INJECTION  01/11/2020   Procedure: SUBMUCOSAL TATTOO INJECTION;  Surgeon: Irene Shipper, MD;  Location[facility] ENDOSCOPY;  Service: Endoscopy;;    Family History  Problem Relation Age of Onset  . Heart failure Brother     No Known Allergies  Outpatient Medications Prior to Visit  Medication Sig Dispense Refill  . albuterol (VENTOLIN HFA) 108 (90 Base) MCG/ACT inhaler Inhale 2 puffs into the lungs every 6 (six) hours as needed for wheezing or shortness of breath. 18 g 2  . allopurinol (ZYLOPRIM) 100 MG tablet Take 200 mg by mouth daily.    . Blood Glucose Monitoring Suppl (TRUE METRIX METER) w/Device KIT Check blood sugar fasting and ant bedtime and record 1 kit 0  . colchicine 0.6 MG tablet Take 2 tablets (1.2 mg) orally at the onset of a gout attack; may repeat 1 tablet (0.6 mg) in 2 hours if symptoms persist 30 tablet 2  . gabapentin (NEURONTIN) 300 MG capsule Take 1 capsule (300 mg total) by mouth 2 (two) times daily. 60 capsule 5  . glucose blood (TRUE METRIX BLOOD GLUCOSE TEST) test strip  Use as instructed 200 each 12  . Misc. Devices (DIGITAL GLASS SCALE) MISC Use scale to weight yourself. Increasing weight may indicate fluid overload 1 each 0  . prochlorperazine (COMPAZINE) 10 MG tablet Take 1 tablet (10 mg total) by mouth every 6 (six) hours as needed for nausea. 60 tablet 0  . TRUEplus Lancets 28G MISC Check blood sugar fasting and at bedtime 210 each 2  .  atorvastatin (LIPITOR) 40 MG tablet Take 1 tablet (40 mg total) by mouth daily. For hypercholesterolemia 30 tablet 2  . carvedilol (COREG) 12.5 MG tablet Take 1 tablet (12.5 mg total) by mouth 2 (two) times daily with a meal. 60 tablet 2  . furosemide (LASIX) 20 MG tablet Take 3 tablets (60 mg total) by mouth 2 (two) times daily. 180 tablet 2  . glipiZIDE (GLUCOTROL) 5 MG tablet Take 1 tablet (5 mg total) by mouth 2 (two) times daily before a meal. 60 tablet 6  . hydrALAZINE (APRESOLINE) 50 MG tablet Take 1 tablet (50 mg total) by mouth 3 (three) times daily. 90 tablet 2  . isosorbide mononitrate (IMDUR) 60 MG 24 hr tablet Take 1 tablet (60 mg total) by mouth daily. 30 tablet 2  . tamsulosin (FLOMAX) 0.4 MG CAPS capsule Take 1 capsule (0.4 mg total) by mouth daily. 30 capsule 2  . amLODipine (NORVASC) 5 MG tablet Take 2 tablets (10 mg total) by mouth daily. 60 tablet 2   No facility-administered medications prior to visit.     ROS Review of Systems  Constitutional: Negative for activity change and appetite change.  HENT: Negative for sinus pressure and sore throat.   Eyes: Negative for visual disturbance.  Respiratory: Negative for cough, chest tightness and shortness of breath.   Cardiovascular: Negative for chest pain and leg swelling.  Gastrointestinal: Negative for abdominal distention, abdominal pain, constipation and diarrhea.  Endocrine: Negative.   Genitourinary: Negative for dysuria.  Musculoskeletal: Negative for joint swelling and myalgias.  Skin: Negative for rash.  Allergic/Immunologic: Negative.   Neurological: Negative for weakness, light-headedness and numbness.  Psychiatric/Behavioral: Negative for dysphoric mood and suicidal ideas.    Objective:  BP (!) 148/94   Pulse 78   Ht 6' 1"  (1.854 m)   Wt 243 lb (110.2 kg)   SpO2 93%   BMI 32.06 kg/m   BP/Weight 05/25/2020 05/21/2020 3/53/6144  Systolic BP 315 400 867  Diastolic BP 94 619 96  Wt. (Lbs) 243 - 243.6   BMI 32.06 - 32.14      Physical Exam Constitutional:      Appearance: He is well-developed.  Neck:     Vascular: No JVD.  Cardiovascular:     Rate and Rhythm: Normal rate.     Heart sounds: Normal heart sounds. No murmur heard.   Pulmonary:     Effort: Pulmonary effort is normal.     Breath sounds: Normal breath sounds. No wheezing or rales.  Chest:     Chest wall: No tenderness.  Abdominal:     General: Bowel sounds are normal. There is no distension.     Palpations: Abdomen is soft. There is no mass.     Tenderness: There is no abdominal tenderness.  Musculoskeletal:        General: Normal range of motion.     Right lower leg: No edema.     Left lower leg: No edema.  Neurological:     Mental Status: He is alert and oriented to person, place, and time.  Psychiatric:        Mood and Affect: Mood normal.     CMP Latest Ref Rng & Units 05/21/2020 05/01/2020 04/03/2020  Glucose 70 - 99 mg/dL 163(H) 121(H) 126(H)  BUN 6 - 20 mg/dL 33(H) 37(H) 43(H)  Creatinine 0.61 - 1.24 mg/dL 3.20(H) 3.33(HH) 3.32(HH)  Sodium 135 - 145 mmol/L 138 138 140  Potassium 3.5 - 5.1 mmol/L 4.4 4.6 4.2  Chloride 98 - 111 mmol/L 103 106 107  CO2 22 - 32 mmol/L 23 23 25   Calcium 8.9 - 10.3 mg/dL 8.1(L) 9.5 8.7(L)  Total Protein 6.5 - 8.1 g/dL - 8.3(H) 7.6  Total Bilirubin 0.3 - 1.2 mg/dL - 0.7 0.6  Alkaline Phos 38 - 126 U/L - 112 94  AST 15 - 41 U/L - 18 20  ALT 0 - 44 U/L - 14 19    Lipid Panel     Component Value Date/Time   CHOL 167 04/27/2019 1020   TRIG 226 (H) 04/27/2019 1020   HDL 30 (L) 04/27/2019 1020   CHOLHDL 5.6 (H) 04/27/2019 1020   CHOLHDL 5.2 (H) 10/23/2015 1009   VLDL 46 (H) 10/23/2015 1009   LDLCALC 98 04/27/2019 1020    CBC    Component Value Date/Time   WBC 9.9 05/21/2020 1115   RBC 4.22 05/21/2020 1115   HGB 12.6 (L) 05/21/2020 1115   HGB 12.6 (L) 05/01/2020 1024   HGB 14.5 07/11/2016 0949   HCT 40.7 05/21/2020 1115   HCT 43.0 07/11/2016 0949   PLT 209  05/21/2020 1115   PLT 200 05/01/2020 1024   PLT 223 07/11/2016 0949   MCV 96.4 05/21/2020 1115   MCV 89 07/11/2016 0949   MCH 29.9 05/21/2020 1115   MCHC 31.0 05/21/2020 1115   RDW 16.0 (H) 05/21/2020 1115   RDW 13.8 07/11/2016 0949   LYMPHSABS 0.9 05/01/2020 1024   LYMPHSABS 0.7 07/11/2016 0949   MONOABS 1.2 (H) 05/01/2020 1024   EOSABS 0.2 05/01/2020 1024   EOSABS 0.0 07/11/2016 0949   BASOSABS 0.0 05/01/2020 1024   BASOSABS 0.0 07/11/2016 0949    Lab Results  Component Value Date   HGBA1C 5.6 05/25/2020    Assessment & Plan:  1. Type 2 diabetes mellitus with stage 4 chronic kidney disease, without long-term current use of insulin (HCC) Controlled with A1c of 5.6 Reduce dose of Glipizide to prevent hypoglycemia Counseled on Diabetic diet, my plate method, 024 minutes of moderate intensity exercise/week Blood sugar logs with fasting goals of 80-120 mg/dl, random of less than 180 and in the event of sugars less than 60 mg/dl or greater than 400 mg/dl encouraged to notify the clinic. Advised on the need for annual eye exams, annual foot exams, Pneumonia vaccine. - POCT glucose (manual entry) - POCT glycosylated hemoglobin (Hb A1C) - Ambulatory referral to Nephrology - Microalbumin / creatinine urine ratio - atorvastatin (LIPITOR) 40 MG tablet; Take 1 tablet (40 mg total) by mouth daily. For hypercholesterolemia  Dispense: 30 tablet; Refill: 6 - glipiZIDE (GLUCOTROL) 5 MG tablet; Take 0.5 tablets (2.5 mg total) by mouth daily before breakfast.  Dispense: 15 tablet; Refill: 6  2. Hypertension in chronic kidney disease due to type 2 diabetes mellitus (HCC) BP Slightly above goal Combination of Hypertensive and Diabetic Nephropathy Avoid nephrotoxins No regimen changes Will refer to Nephrology - amLODipine (NORVASC) 5 MG tablet; Take 2 tablets (10 mg total) by mouth daily.  Dispense: 60 tablet; Refill: 6 - hydrALAZINE (APRESOLINE) 50 MG tablet; Take 1 tablet (  50 mg total) by  mouth 3 (three) times daily.  Dispense: 90 tablet; Refill: 6  3. Hypertensive heart disease with chronic diastolic congestive heart failure (HCC) EF 60-65% Euvolemic Echo findings suspicious for Amyloidosis - Ambulatory referral to Cardiology - carvedilol (COREG) 12.5 MG tablet; Take 1 tablet (12.5 mg total) by mouth 2 (two) times daily with a meal.  Dispense: 60 tablet; Refill: 6 - furosemide (LASIX) 20 MG tablet; Take 3 tablets (60 mg total) by mouth 2 (two) times daily.  Dispense: 180 tablet; Refill: 2 - isosorbide mononitrate (IMDUR) 60 MG 24 hr tablet; Take 1 tablet (60 mg total) by mouth daily.  Dispense: 30 tablet; Refill: 6  4. Lower urinary tract symptoms Stable - tamsulosin (FLOMAX) 0.4 MG CAPS capsule; Take 1 capsule (0.4 mg total) by mouth daily.  Dispense: 30 capsule; Refill: 6  5. Rectal cancer [facility]) S/p neoadjuvant chemo and radiation Contemplating surgery Discussed need for Advanced directives and we have provided him with information aboutthis Keep appointment with the cancer center.    Meds ordered this encounter  Medications  . amLODipine (NORVASC) 5 MG tablet    Sig: Take 2 tablets (10 mg total) by mouth daily.    Dispense:  60 tablet    Refill:  6  . atorvastatin (LIPITOR) 40 MG tablet    Sig: Take 1 tablet (40 mg total) by mouth daily. For hypercholesterolemia    Dispense:  30 tablet    Refill:  6    Discontinue previous dose  . carvedilol (COREG) 12.5 MG tablet    Sig: Take 1 tablet (12.5 mg total) by mouth 2 (two) times daily with a meal.    Dispense:  60 tablet    Refill:  6  . furosemide (LASIX) 20 MG tablet    Sig: Take 3 tablets (60 mg total) by mouth 2 (two) times daily.    Dispense:  180 tablet    Refill:  2  . glipiZIDE (GLUCOTROL) 5 MG tablet    Sig: Take 0.5 tablets (2.5 mg total) by mouth daily before breakfast.    Dispense:  15 tablet    Refill:  6    Dose decrease  . hydrALAZINE (APRESOLINE) 50 MG tablet    Sig: Take 1 tablet (50 mg  total) by mouth 3 (three) times daily.    Dispense:  90 tablet    Refill:  6  . isosorbide mononitrate (IMDUR) 60 MG 24 hr tablet    Sig: Take 1 tablet (60 mg total) by mouth daily.    Dispense:  30 tablet    Refill:  6  . tamsulosin (FLOMAX) 0.4 MG CAPS capsule    Sig: Take 1 capsule (0.4 mg total) by mouth daily.    Dispense:  30 capsule    Refill:  6    Follow-up: Return in about 6 months (around 11/22/2020) for Chronic disease management.       Charlott Rakes, MD, FAAFP. [facility] [place], [place]   05/25/2020, 8:38 PM

## 2020-05-25 NOTE — Patient Instructions (Signed)
reduce Glipizide to 2.5mg  daily

## 2020-05-26 ENCOUNTER — Inpatient Hospital Stay (HOSPITAL_BASED_OUTPATIENT_CLINIC_OR_DEPARTMENT_OTHER): Payer: Medicaid Other | Admitting: Nurse Practitioner

## 2020-05-26 ENCOUNTER — Encounter: Payer: Self-pay | Admitting: Nurse Practitioner

## 2020-05-26 VITALS — BP 160/110 | HR 76 | Temp 97.1°F | Resp 18 | Ht 73.0 in | Wt 245.0 lb

## 2020-05-26 DIAGNOSIS — C20 Malignant neoplasm of rectum: Secondary | ICD-10-CM | POA: Diagnosis not present

## 2020-05-26 NOTE — Progress Notes (Addendum)
  [place] OFFICE PROGRESS NOTE   Diagnosis: Rectal cancer  INTERVAL HISTORY:   Craig West returns as scheduled.  He is feeling well.  Bowels are moving.  No blood or pain with bowel movements. He reports a good appetite.  Objective:  Vital signs in last 24 hours:  Blood pressure (!) 160/110, pulse 76, temperature (!) 97.1 F (36.2 C), temperature source Tympanic, resp. rate 18, height 6\' 1"  (1.854 m), weight 245 lb (111.1 kg), SpO2 97 %.    HEENT: No thrush or ulcers. Resp: Lungs clear bilaterally. Cardio: Regular rate and rhythm. GI: Abdomen soft and nontender.  No hepatomegaly. Vascular: No leg edema.  Lab Results:  Lab Results  Component Value Date   WBC 9.9 05/21/2020   HGB 12.6 (L) 05/21/2020   HCT 40.7 05/21/2020   MCV 96.4 05/21/2020   PLT 209 05/21/2020   NEUTROABS 8.9 (H) 05/01/2020    Imaging:  No results found.  Medications: I have reviewed the patient's current medications.  Assessment/Plan: 1. Rectal cancer-rectal mass at 5-6 cm from the anal verge on colonoscopy 01/11/2020, biopsy confirmed adenocarcinoma ? CTs 01/11/2020-rectal thickening, small perirectal lymph nodes and external iliac nodes, moderate right pleural effusion ? MRI pelvis 01/27/2020-[id number] tumor at 7.1 cm from the internal anal sphincter, multiple mesorectal nodes, indistinct left pelvic sidewall node and enlarged bilateral external iliac nodes ? PET scan 02/11/2020-hypermetabolic rectosigmoid mass, hypermetabolic mesorectal and sigmoid mesocolon lymph nodes, left external iliac/obturator node with mild hypermetabolism, 18 mm left inguinal node with normal architecture and slight hypermetabolism ? Radiation and infusional 5-FU 02/21/2020-04/04/2020 ? CTs 05/09/2020-decreased perirectal and sigmoid mesocolon lymphadenopathy, no evidence of disease progression ? MRI pelvis 05/17/2020-no well-defined residual rectal mass seen.  Wall appears slightly irregular.  No perirectal  extension of tumor.  Again identified is perirectal adenopathy, similar to most recent CT 05/10/2020 but improved compared to PET/CT of 02/11/2020. 2. Anemia 3. Chronic renal failure 4. Congestive heart failure-severe LVH 5. Diabetes 6. Gout 7. Peripheral neuropathy? 8.   Hypertension   Disposition: Craig West appears stable.  He was seen by Dr. Judi Cong at [facility] who agrees with Dr. Manon Hilding surgical recommendation.  Craig West has decided to proceed with surgery and would like to have this done in [place].  We will notify Dr. Manon Hilding office.  Craig West will return for a follow-up visit here in 5 to 6 weeks.  He understands there may be a recommendation for chemotherapy following surgery depending on the pathology.  Patient seen with Dr. Benay Spice.    [name] ANP/GNP-BC   05/26/2020  3:44 PM This was a shared visit with [name].  Craig West saw Dr. Judi Cong for a second surgical opinion.  He agrees with the recommendation of Dr. Marcello Moores to proceed with resection of the primary tumor and a permanent colostomy.  Craig West has decided to proceed with surgery.  We discussed alternate treatment options including continuation of chemotherapy.  He understands this is unlikely to be curative.  He appears to have experienced a significant response to the neoadjuvant 5-FU and radiation.  There is no remaining rectal mass on MRI, but he continues to have perirectal adenopathy.  I will defer a decision on a preoperative proctoscopy to Dr. Marcello Moores.    I was present for greater than 50% of today's visit.  I performed medical decision making.  [name], MD

## 2020-05-29 ENCOUNTER — Telehealth: Payer: Self-pay | Admitting: Nurse Practitioner

## 2020-05-29 ENCOUNTER — Ambulatory Visit: Payer: Self-pay | Admitting: General Surgery

## 2020-05-29 NOTE — H&P (Signed)
The patient is a 55 year old male who presents with colorectal cancer. 56 year old male who recently presented to the emergency department with acute on chronic congestive heart failure and acute kidney disease with anemia and rectal bleeding. Colonoscopy was performed on 01/11/2020. This showed an ulcerated, nonobstructing mass in the rectum, 5-6 cm from the anal verge. The mass was tattooed distally. 3 other polyps were found in the colon and removed. The rectal biopsy showed adenocarcinoma. CT scan showed no sign of distant metastatic disease. He underwent an MRI of the pelvis, which showed a T3 be in to be rectal cancer. Approximate 7 cm from the anal sphincters. Patient has a family history of colon cancer. His tumor was MSI stable. He completed standard chemotherapy and radiation with his final treatment date being January 4. He is here today for follow-up. Past Medical History:  Diagnosis Date  . Asthma   . CHF (congestive heart failure) ([place])   . CKD (chronic kidney disease), stage IV ([place])   . COVID-19 virus infection 04/2019  . Diabetes mellitus without complication ([place])   . Gout   . Hypertension   . Rectal cancer [facility])    Past Surgical History:  Procedure Laterality Date  . BIOPSY  01/11/2020   Procedure: BIOPSY;  Surgeon: Irene Shipper, MD;  Location: [facility] ENDOSCOPY;  Service: Endoscopy;;  . COLONOSCOPY WITH PROPOFOL N/A 01/11/2020   Procedure: COLONOSCOPY WITH PROPOFOL;  Surgeon: Irene Shipper, MD;  Location: [place];  Service: Endoscopy;  Laterality: N/A;  . NO PAST SURGERIES    . POLYPECTOMY  01/11/2020   Procedure: POLYPECTOMY;  Surgeon: Irene Shipper, MD;  Location: [facility] ENDOSCOPY;  Service: Endoscopy;;  . SUBMUCOSAL TATTOO INJECTION  01/11/2020   Procedure: SUBMUCOSAL TATTOO INJECTION;  Surgeon: Irene Shipper, MD;  Location: [facility] ENDOSCOPY;  Service: Endoscopy;;   Family History  Problem Relation Age of Onset  . Heart failure Brother    Social History    Socioeconomic History  . Marital status: Single    Spouse name: Not on file  . Number of children: Not on file  . Years of education: Not on file  . Highest education level: Not on file  Occupational History  . Not on file  Tobacco Use  . Smoking status: Former Smoker    Packs/day: 0.50    Types: Cigarettes    Quit date: 10/27/2019    Years since quitting: 0.5  . Smokeless tobacco: Never Used  Vaping Use  . Vaping Use: Never used  Substance and Sexual Activity  . Alcohol use: Yes    Alcohol/week: 0.0 standard drinks    Comment: occ  . Drug use: No  . Sexual activity: Not on file  Other Topics Concern  . Not on file  Social History Narrative  . Not on file   Social Determinants of Health   Financial Resource Strain: Medium Risk  . Difficulty of Paying Living Expenses: Somewhat hard  Food Insecurity: No Food Insecurity  . Worried About Charity fundraiser in the Last Year: Never true  . Ran Out of Food in the Last Year: Never true  Transportation Needs: No Transportation Needs  . Lack of Transportation (Medical): No  . Lack of Transportation (Non-Medical): No  Physical Activity: Unknown  . Days of Exercise per Week: 0 days  . Minutes of Exercise per Session: Not on file  Stress: Stress Concern Present  . Feeling of Stress : To some extent  Social Connections: Not  on file  Intimate Partner Violence: Not on file   Review of Systems - Negative except as stated above  Allergies Janeann Forehand, CNA; 05/08/2020 10:10 AM) No Known Drug Allergies  [01/25/2020]: Allergies Reconciled   Medication History  Albuterol Sulfate HFA (108 (90 Base)MCG/ACT Aerosol Soln, Inhalation) Active. (Inhale 2 puffs into the lungs every 6 hours PRN for Wheezing or SOB) Allopurinol (100MG Tablet, 2 Oral daily) Active. amLODIPine Besylate (5MG Tablet, 1 Oral daily) Active. (For Hypertension) Atorvastatin Calcium (40MG Tablet, 1 Oral daily) Active. (For  hypercholesterolemia) Carvedilol (12.5MG Tablet, 1 Oral two times daily) Active. (With meals) Colchicine (0.6MG Tablet, Oral) Active. (Take 2 tablets (1.2 mg) orally at the onset of a gout attack; may repeat 1 tablet (0.6 mg) in 2 hours if symptoms persist) Furosemide (20MG Tablet, 3 Oral two times daily) Active. Gabapentin (100MG Capsule, 1 Oral at bedtime) Active. glipiZIDE (10MG Tablet, 1 Oral two times daily) Active. (Before a meal) hydrALAZINE HCl (50MG Tablet, 1 Oral every twelve hours) Active. Tamsulosin HCl (0.4MG Capsule, 1 Oral daily) Active. TRUEplus Lancets 28G Active. (Check blood sugar fasting and at bedtime) Medications Reconciled    Physical Exam Leighton Ruff MD; 06/06/962 10:30 AM) General Mental Status-Alert. General Appearance-Cooperative.  Abdomen Palpation/Percussion Palpation and Percussion of the abdomen reveal - Soft.  Rectal Anorectal Exam Internal - Note: Unable to palpate mass. Large volume of stool in the rectal vault.    Assessment & Plan Leighton Ruff MD; 06/06/3816 10:31 AM) RECTAL CANCER ([facility]) Impression: 55 year old male with congestive heart failure and chronic kidney disease stage III. We discussed the standard treatment for rectal cancer is chemotherapy, radiation and surgery. We discussed that given his heart disease and chronic kidney disease, I do not think he is a good candidate for low anterior resection with anastomosis due to the fact that he will need a diverting ileostomy and the fluid shifts with this will most likely worsen his kidney disease and/or CHF. He is unwilling to accept this and would like a second opinion. We will refer him to a tertiary care center for another surgical evaluation and get a repeat MRI to evaluate his response to treatment. He appears to have poor insight and memory of discussions about his disease process. The surgery and anatomy were described to the patient as well as the risks of surgery and the  possible complications.  These include: Bleeding, deep abdominal infections and possible wound complications such as hernia and infection, damage to adjacent structures, leak of surgical connections, which can lead to other surgeries and possibly an ostomy, possible need for other procedures, such as abscess drains in radiology, possible prolonged hospital stay, possible diarrhea from removal of part of the colon, possible constipation from narcotics, prolonged fatigue/weakness or appetite loss, possible early recurrence of of disease, possible complications of their medical problems such as heart disease or arrhythmias or lung problems, death (less than 1%). I believe the patient understands and wishes to proceed with the surgery.

## 2020-05-29 NOTE — Telephone Encounter (Signed)
Scheduled appointment per 2/25 los. Spoke to patient who is aware of appointment date and time.  

## 2020-05-30 ENCOUNTER — Telehealth: Payer: Self-pay

## 2020-05-30 NOTE — Telephone Encounter (Signed)
   [facility] Pre-operative Risk Assessment    HEARTCARE STAFF: - Please ensure there is not already an duplicate clearance open for this procedure. - Under Visit Info/Reason for Call, type in Other and utilize the format Clearance MM/DD/YY or Clearance TBD. Do not use dashes or single digits. - If request is for dental extraction, please clarify the # of teeth to be extracted.  Request for surgical clearance:  1. What type of surgery is being performed? Low Anterior Resection  2. When is this surgery scheduled? TBD  3. What type of clearance is required (medical clearance vs. Pharmacy clearance to hold med vs. Both)? BOTH  4. Are there any medications that need to be held prior to surgery and how long? N/A   5. Practice name and name of physician performing surgery? [facility]- Leighton Ruff MD   6. What is the office phone number? [phone]   7.   What is the office fax number? [phone]  8.   Anesthesia type (None, local, MAC, general) ? GENERAL   Craig West Craig West 05/30/2020, 8:10 AM  _________________________________________________________________   (provider comments below)

## 2020-05-31 ENCOUNTER — Other Ambulatory Visit: Payer: Self-pay

## 2020-05-31 ENCOUNTER — Ambulatory Visit (INDEPENDENT_AMBULATORY_CARE_PROVIDER_SITE_OTHER): Payer: Medicaid Other | Admitting: Cardiovascular Disease

## 2020-05-31 VITALS — BP 204/140 | HR 87 | Ht 73.0 in | Wt 242.0 lb

## 2020-05-31 DIAGNOSIS — C189 Malignant neoplasm of colon, unspecified: Secondary | ICD-10-CM | POA: Diagnosis not present

## 2020-05-31 DIAGNOSIS — I10 Essential (primary) hypertension: Secondary | ICD-10-CM | POA: Diagnosis not present

## 2020-05-31 DIAGNOSIS — I5031 Acute diastolic (congestive) heart failure: Secondary | ICD-10-CM | POA: Diagnosis not present

## 2020-05-31 DIAGNOSIS — I160 Hypertensive urgency: Secondary | ICD-10-CM

## 2020-05-31 DIAGNOSIS — N184 Chronic kidney disease, stage 4 (severe): Secondary | ICD-10-CM

## 2020-05-31 NOTE — Patient Instructions (Addendum)
Medication Instructions:  Your physician recommends that you continue on your current medications as directed. Please refer to the Current Medication list given to you today.  *If you need a refill on your cardiac medications before your next appointment, please call your pharmacy*  Lab Work: NONE   Testing/Procedures: NONE   Follow-Up: At [facility], you and your health needs are our priority.  As part of our continuing mission to provide you with exceptional heart care, we have created designated Provider Care Teams.  These Care Teams include your primary Cardiologist (physician) and Advanced Practice Providers (APPs -  Physician Assistants and Nurse Practitioners) who all work together to provide you with the care you need, when you need it.  We recommend signing up for the patient portal called "MyChart".  Sign up information is provided on this After Visit Summary.  MyChart is used to connect with patients for Virtual Visits (Telemedicine).  Patients are able to view lab/test results, encounter notes, upcoming appointments, etc.  Non-urgent messages can be sent to your provider as well.   To learn more about what you can do with MyChart, go to [email or web address].    Your next appointment:   2 month(s)  The format for your next appointment:   In Person  Provider:   You may see Skeet Latch, MD or one of the following Advanced Practice Providers on your designated Care Team:    Sande Rives, PA-C  Coletta Memos, FNP  Your physician recommends that you schedule a follow-up appointment in: 2 [place] have been referred to [place]  Other Instructions  WILL TRY TO HAVE A BLOOD PRESSURE MACHINE MAILED TO YOU. ONCE YOU RECEIVE IT MONITOR AND LOG YOUR BLOOD PRESSURE DAILY, BRING TO YOUR FOLLOW UP

## 2020-05-31 NOTE — Progress Notes (Signed)
Cardiology Office Visit:    Date:  06/22/2020   ID:  Craig West, DOB 16-Jun-1965, MRN [id number]  PCP:  Charlott Rakes, MD  Cardiologist:  Skeet Latch, MD  Nephrologist:  Referring MD: Charlott Rakes, MD   CC: Hypertension  History of Present Illness:    Craig West is a 55 y.o. male with a hx of hypertension, diabetes, CKD IV, rectal carcinoma, and asthma  here for follow up.  He was initially seen to establish care in the advanced hypertension clinic. Craig West reports having hypertension for at least 2 years.  He has struggled to afford his medications due to lack of insurance and lack of income.  He was admitted to the hospital 12/2019 with hypertensive emergency.  He presented to the hospital with subacute onset of shortness of breath and edema.  He reported several nights of orthopnea.  He initially thought that his edema was attributable to gout.  In the ED his blood pressure was 263/163.  He was given labetalol and IV hydralazine.  His home antihypertensives were subsequently resumed (amlodipine, hydrochlorothiazide, and lisinopril).  An echocardiogram was obtained that revealed LVEF 60 to 65% with severe LVH.  Left ventricular wall thickness was 1.8 to 1.9 cm.  Cardiology was consulted due to concern for possible infiltrative cardiomyopathy.  He denies any palpitations and has no family history of sudden cardiac death.  There were some short episodes of NSVT.  High-sensitivity troponin was elevated to 218.  His home lisinopril and hydrochlorothiazide were discontinued due to his CKD 4.  He was diuresed with IV Lasix and this was switched to oral at discharge.  He was started on carvedilol and hydralazine.  His weight at the time of discharge was 232 pounds.  Craig West saw his PCP on 10/4.  At that time he was hypotensive to 84/60.  Amlodipine was reduced to 5 mg.  His weight had increased to 260 pounds.  Labs were obtained and BUN was 84 with a creatinine increased to 4.79.   Potassium was 5.9. Since that time he was also diagnosed with rectal carcinoma and completed for Chemo and XRT.  He was seen by [facility].  Today he forgot his medications and has not taken them yet.  He sometimes forgets them at work.  He does not have a blood pressure machine.  Overall he has been feeling more short of breath for the last 2 weeks.  He denies any lower extremity edema, orthopnea, or PND.  He has no chest pain or pressure.  Past Medical History:  Diagnosis Date  . Asthma   . CHF (congestive heart failure) ([place])   . CKD (chronic kidney disease), stage IV ([place])   . COVID-19 virus infection 04/2019  . Diabetes mellitus without complication ([place])   . Gout   . Hypertension   . Rectal cancer [facility])     Past Surgical History:  Procedure Laterality Date  . BIOPSY  01/11/2020   Procedure: BIOPSY;  Surgeon: Irene Shipper, MD;  Location: [facility] ENDOSCOPY;  Service: Endoscopy;;  . COLONOSCOPY WITH PROPOFOL N/A 01/11/2020   Procedure: COLONOSCOPY WITH PROPOFOL;  Surgeon: Irene Shipper, MD;  Location: [place];  Service: Endoscopy;  Laterality: N/A;  . NO PAST SURGERIES    . POLYPECTOMY  01/11/2020   Procedure: POLYPECTOMY;  Surgeon: Irene Shipper, MD;  Location: [facility] ENDOSCOPY;  Service: Endoscopy;;  . SUBMUCOSAL TATTOO INJECTION  01/11/2020   Procedure: SUBMUCOSAL TATTOO INJECTION;  Surgeon: Irene Shipper,  MD;  Location: [place];  Service: Endoscopy;;    Current Medications: No outpatient medications have been marked as taking for the 05/31/20 encounter (Office Visit) with Skeet Latch, MD.     Allergies:   Patient has no known allergies.   Social History   Socioeconomic History  . Marital status: Single    Spouse name: Not on file  . Number of children: Not on file  . Years of education: Not on file  . Highest education level: Not on file  Occupational History  . Not on file  Tobacco Use  . Smoking status: Former Smoker    Packs/day: 0.50     Types: Cigarettes    Quit date: 10/27/2019    Years since quitting: 0.6  . Smokeless tobacco: Never Used  Vaping Use  . Vaping Use: Never used  Substance and Sexual Activity  . Alcohol use: Yes    Alcohol/week: 0.0 standard drinks    Comment: occ  . Drug use: No  . Sexual activity: Not on file  Other Topics Concern  . Not on file  Social History Narrative  . Not on file   Social Determinants of Health   Financial Resource Strain: Medium Risk  . Difficulty of Paying Living Expenses: Somewhat hard  Food Insecurity: No Food Insecurity  . Worried About Charity fundraiser in the Last Year: Never true  . Ran Out of Food in the Last Year: Never true  Transportation Needs: No Transportation Needs  . Lack of Transportation (Medical): No  . Lack of Transportation (Non-Medical): No  Physical Activity: Unknown  . Days of Exercise per Week: 0 days  . Minutes of Exercise per Session: Not on file  Stress: Stress Concern Present  . Feeling of Stress : To some extent  Social Connections: Not on file     Family History: The patient's family history includes Heart failure in his brother.  ROS:   Please see the history of present illness.    All other systems reviewed and are negative.  EKGs/Labs/Other Studies Reviewed:    EKG:  EKG is not ordered today.    Recent Labs: 01/03/2020: TSH 2.170 01/06/2020: Magnesium 2.5 05/21/2020: B Natriuretic Peptide 791.0 06/14/2020: ALT 19; BUN 36; Creatinine 3.26; Hemoglobin 11.4; Platelet Count 227; Potassium 4.4; Sodium 138   Recent Lipid Panel    Component Value Date/Time   CHOL 167 04/27/2019 1020   TRIG 226 (H) 04/27/2019 1020   HDL 30 (L) 04/27/2019 1020   CHOLHDL 5.6 (H) 04/27/2019 1020   CHOLHDL 5.2 (H) 10/23/2015 1009   VLDL 46 (H) 10/23/2015 1009   LDLCALC 98 04/27/2019 1020    Physical Exam:    VS:  BP (!) 204/140   Pulse 87   Ht 6\' 1"  (1.854 m)   Wt 242 lb (109.8 kg)   SpO2 97%   BMI 31.93 kg/m  , BMI Body mass index is  31.93 kg/m. GENERAL:  Ill-appearing.  Profound respiratory distress HEENT: Pupils equal round and reactive, fundi not visualized, oral mucosa unremarkable NECK:  + jugular venous distention, waveform within normal limits, carotid upstroke brisk and symmetric, no bruits LUNGS:  Clear to auscultation bilaterally HEART:  RRR.  PMI not displaced or sustained,S1 and S2 within normal limits, no S3, no S4, no clicks, no rubs, no murmurs ABD:  Flat, positive bowel sounds normal in frequency in pitch, no bruits, no rebound, no guarding, no midline pulsatile mass, no hepatomegaly, no splenomegaly EXT:  2 plus pulses  throughout, 2+ pitting edema to above the knee, no cyanosis no clubbing SKIN:  No rashes no nodules NEURO:  Cranial nerves II through XII grossly intact, motor grossly intact throughout [facility]:  Cognitively intact, oriented to person place and time  Echo 12/25/19: 1. Consider infiltrative cardiomyopathy such as amyloidosis or global  variant hypertrophic cardiomyopathy - LV wall thickness 1.8-1.9 cm. Left  ventricular ejection fraction, by estimation, is 60 to 65%. The left  ventricle has normal function. The left  ventricle has no regional wall motion abnormalities. There is severe  concentric left ventricular hypertrophy. Left ventricular diastolic  parameters are consistent with Grade II diastolic dysfunction  (pseudonormalization). Elevated left ventricular  end-diastolic pressure.  2. Right ventricular systolic function is normal. The right ventricular  size is normal. Tricuspid regurgitation signal is inadequate for assessing  PA pressure.  3. Left atrial size was mildly dilated.  4. The mitral valve is abnormal. Mild mitral valve regurgitation.  5. The aortic valve is tricuspid. Aortic valve regurgitation is not  visualized. Mild aortic valve sclerosis is present, with no evidence of  aortic valve stenosis.  6. The inferior vena cava is normal in size with greater than 50%   respiratory variability, suggesting right atrial pressure of 3 mmHg.   Renal artery Doppler 12/2019: Normal bilaterally.  ASSESSMENT:    1. Essential hypertension   2. Malignant neoplasm of colon, unspecified part of colon ([place])   3. Acute diastolic heart failure ([place])   4. Hypertensive urgency   5. CKD (chronic kidney disease), stage IV ([facility])     PLAN:    # Essential hypertension:  It is difficult to know what to make of his blood pressure.  He did not take his medications today.  We did discuss ways to try remember to take his medication and not keep it at work.  We will not make any changes since we do not know what his blood pressure is doing on medicine.  We will help him with getting a blood pressure machine so he can check at home.  He will go and get his medication now to take it.  # Acute on chronic diastolic heart failure:  # Severe LVH:  His echo was concerning for infiltrative cardiomyopathy.  However I suspect this is more related to poorly controlled hypertension.  He has significant voltage on his EKG which does not exclude infiltrative cardiomyopathy, but does make it less likely especially in the setting of severe hypertension that was poorly controlled for years.  He isn't a candidate for MRI and gadolinium 2/2 renal disease.  # NSVT: Continue carvedilol.   # Elevated troponin:  Occurred in the setting of hypertensive emergency.    # Rectal cancer:  Craig West has a recent diagnosis of rectal cancer.  He is deemed high risk for surgery given his diastolic heart failure, severe LVH, chronic kidney disease, and therefore has a high risk of requiring colostomy bag.  He is very upset about this idea.  He understands that with surgery there is a high likelihood he will ultimately end up on dialysis.  Did recommend that he consider talking with [facility] to see if there may be other alternatives.  He has significant SDOH needs.  Will as our Education officer, museum to consult for  assistance with getting insurance and affording medications.   Disposition:    FU with PharmD in 2 weeks. MD in 2 months.   Medication Adjustments/Labs and Tests Ordered: Current medicines are reviewed at length  with the patient today.  Concerns regarding medicines are outlined above.  Orders Placed This Encounter  Procedures  . AMB Referral to [facility] Pharm-D  . Ambulatory referral to Surgical Oncology   No orders of the defined types were placed in this encounter.    Signed, Skeet Latch, MD  06/22/2020 11:51 AM    [place]

## 2020-05-31 NOTE — Telephone Encounter (Signed)
I s/w the pt and he is agreeable to pre op appt. I have scheduled pt to see Dr. Oval Linsey today at 11:20 at the [place] office . Pt has been given the address of [facility] office. Pt is grateful for the appt today. I will forward notes to MD for upcoming appt.

## 2020-05-31 NOTE — Telephone Encounter (Signed)
   Primary Cardiologist: Skeet Latch, MD  Chart reviewed as part of pre-operative protocol coverage. Because of Craig West's past medical history and time since last visit, he will require a follow-up visit in order to better assess preoperative cardiovascular risk.  Pre-op covering staff: - Please schedule appointment and call patient to inform them. If patient already had an upcoming appointment within acceptable timeframe, please add "pre-op clearance" to the appointment notes so provider is aware. - Please contact requesting surgeon's office via preferred method (i.e, phone, fax) to inform them of need for appointment prior to surgery.  If applicable, this message will also be routed to pharmacy pool and/or primary cardiologist for input on holding anticoagulant/antiplatelet agent as requested below so that this information is available to the clearing provider at time of patient's appointment.   [name], NP  05/31/2020, 8:18 AM

## 2020-06-01 ENCOUNTER — Telehealth: Payer: Self-pay | Admitting: Licensed Clinical Social Worker

## 2020-06-01 NOTE — Telephone Encounter (Signed)
LCSW received referral for BP Cuff from [place], [place]. Pt able to come collect cuff from [facility] office. LCSW also sent message to [place] to see if there was any additional needs at this time that pt could use assistance with, I see that he has been involved with their team at this time.   Westley Hummer, MSW, [place]  [phone]

## 2020-06-02 ENCOUNTER — Telehealth: Payer: Self-pay | Admitting: Cardiovascular Disease

## 2020-06-02 NOTE — Telephone Encounter (Signed)
Spoke with patient regarding 06/06/20 1:30 pm appointment at [facility]  [address]  [place], [place] [address].  Arrival time is 1:00pn for check in---phone is [phone]---informed patient he will need to check with his insurance to see if [facility] is in network---told patient to call his caseworker if he needs assistance with this.

## 2020-06-06 ENCOUNTER — Telehealth: Payer: Self-pay | Admitting: Oncology

## 2020-06-06 NOTE — Progress Notes (Signed)
COVID Vaccine Completed:  x3 Date COVID Vaccine completed:  08-23-19, 09-13-19 Has received booster:  02-28-20 COVID vaccine manufacturer: Pfizer    Date of COVID positive in last 90 days:  04-03-20 results in [place]  PCP - Charlott Rakes, MD Cardiologist - Skeet Latch, MD  Chest x-ray - 05-21-20 Epic  EKG - 05-02-20 Epic Stress Test -  ECHO - 12-25-19 Epic Cardiac Cath -  Pacemaker/ICD device last checked: Spinal Cord Stimulator:  Sleep Study -  CPAP -   Fasting Blood Sugar -  Checks Blood Sugar _____ times a day  Blood Thinner Instructions: Aspirin Instructions: Last Dose:  Activity level:  Can go up a flight of stairs and perform activities of daily living without stopping and without symptoms of chest pain or shortness of breath.   Able to exercise without symptoms  Unable to go up a flight of stairs without symptoms of      Anesthesia review:  Heart failure, asthma, HTN, DM, CKD,   Patient denies shortness of breath, fever, cough and chest pain at PAT appointment   Patient verbalized understanding of instructions that were given to them at the PAT appointment. Patient was also instructed that they will need to review over the PAT instructions again at home before surgery.

## 2020-06-06 NOTE — Telephone Encounter (Signed)
Scheduled appt per 3/4 sch msg - pt is aware of appt at new location

## 2020-06-06 NOTE — Patient Instructions (Addendum)
DUE TO COVID-19 ONLY ONE VISITOR IS ALLOWED TO COME WITH YOU AND STAY IN THE WAITING ROOM ONLY DURING PRE OP AND PROCEDURE.   IF YOU WILL BE ADMITTED INTO THE HOSPITAL YOU ARE ALLOWED ONLY TWO SUPPORT PEOPLE DURING VISITATION HOURS ONLY (10AM -8PM)   . The support person(s) may change daily. . The support person(s) must pass our screening, gel in and out, and wear a mask at all times, including in the patient's room. . Patients must also wear a mask when staff or their support person are in the room.  No visitors under the age of 35. Any visitor under the age of 77 must be accompanied by an adult.          Your procedure is scheduled on:  Friday, 06-23-20   Report to [facility] Main  Entrance    Report to admitting at 12:00 PM (noon)   Call this number if you have problems the morning of surgery [phone]   Do not eat food :After Midnight.   May have liquids until 11:00 AM  day of surgery  CLEAR LIQUID DIET  Foods Allowed                                                                     Foods Excluded  Water, Black Coffee and tea, regular and decaf             liquids that you cannot  Plain Jell-O in any flavor  (No red)                                    see through such as: Fruit ices (not with fruit pulp)                                      milk, soups, orange juice              Iced Popsicles (No red)                                      All solid food                                   Apple juices Sports drinks like Gatorade (No red) Lightly seasoned clear broth or consume(fat free) Sugar, honey syrup      Drink 2 Ensure drinks the night before surgery.  Complete one Ensure drink the morning of surgery 3 hours prior to scheduled surgery.     1. The day of surgery:  ? Drink ONE (1) Pre-Surgery Clear Ensure or G2 by am the morning of surgery. Drink in one sitting. Do not sip.  ? This drink was given to you during your hospital  pre-op appointment  visit. ? Nothing else to drink after completing the  Pre-Surgery Clear Ensure or G2.          If you have questions, please contact  your surgeon's office.     Oral Hygiene is also important to reduce your risk of infection.                                    Remember - BRUSH YOUR TEETH THE MORNING OF SURGERY WITH YOUR REGULAR TOOTHPASTE   Do NOT smoke after Midnight   Take these medicines the morning of surgery with A SIP OF WATER:  Allopurinol, Amlodipine, Atorvastatin, Carvedilol, Isosorbide,  Gabapentin, Tamsulosin, Hydralazine.  Okay to use inhalers and bring with you day of surgery  How to Manage Your Diabetes Before and After Surgery  Why is it important to control my blood sugar before and after surgery? . Improving blood sugar levels before and after surgery helps healing and can limit problems. . A way of improving blood sugar control is eating a healthy diet by: o  Eating less sugar and carbohydrates o  Increasing activity/exercise o  Talking with your doctor about reaching your blood sugar goals . High blood sugars (greater than 180 mg/dL) can raise your risk of infections and slow your recovery, so you will need to focus on controlling your diabetes during the weeks before surgery. . Make sure that the doctor who takes care of your diabetes knows about your planned surgery including the date and location.  How do I manage my blood sugar before surgery? . Check your blood sugar at least 4 times a day, starting 2 days before surgery, to make sure that the level is not too high or low. o Check your blood sugar the morning of your surgery when you wake up and every 2 hours until you get to the Short Stay unit. . If your blood sugar is less than 70 mg/dL, you will need to treat for low blood sugar: o Do not take insulin. o Treat a low blood sugar (less than 70 mg/dL) with  cup of clear juice (cranberry or apple), 4 glucose tablets, OR glucose gel. o Recheck blood sugar in 15  minutes after treatment (to make sure it is greater than 70 mg/dL). If your blood sugar is not greater than 70 mg/dL on recheck, call [phone] for further instructions. . Report your blood sugar to the short stay nurse when you get to Short Stay.  . If you are admitted to the hospital after surgery: o Your blood sugar will be checked by the staff and you will probably be given insulin after surgery (instead of oral diabetes medicines) to make sure you have good blood sugar levels. o The goal for blood sugar control after surgery is 80-180 mg/dL.   WHAT DO I DO ABOUT MY DIABETES MEDICATION?  Marland Kitchen Do not take oral diabetes medicines (pills) the morning of surgery.  . THE DAY BEFORE SURGERY:  Take Glipizide as prescribed (no evening dose).       . THE MORNING OF SURGERY:  Do not take Glipizide.   Reviewed and Endorsed by [facility] Patient Education Committee, August 2015                               You may not have any metal on your body including  jewelry, and body piercings             Do not wear  lotions, powders, perfumes/cologne, or deodorant  Men may shave face and neck.   Do not bring valuables to the hospital. [place].   Contacts, dentures or bridgework may not be worn into surgery.   Bring small overnight bag day of surgery.    Special Instructions: Bring a copy of your healthcare power of attorney and living will documents         the day of surgery if you haven't  scanned them in before.              Please read over the following fact sheets you were given: IF YOU HAVE QUESTIONS ABOUT YOUR PRE OP INSTRUCTIONS PLEASE CALL  [phone]   - Preparing for Surgery Before surgery, you can play an important role.  Because skin is not sterile, your skin needs to be as free of germs as possible.  You can reduce the number of germs on your skin by washing with CHG (chlorahexidine gluconate) soap before  surgery.  CHG is an antiseptic cleaner which kills germs and bonds with the skin to continue killing germs even after washing. Please DO NOT use if you have an allergy to CHG or antibacterial soaps.  If your skin becomes reddened/irritated stop using the CHG and inform your nurse when you arrive at Short Stay. Do not shave (including legs and underarms) for at least 48 hours prior to the first CHG shower.  You may shave your face/neck.  Please follow these instructions carefully:  1.  Shower with CHG Soap the night before surgery and the  morning of surgery.  2.  If you choose to wash your hair, wash your hair first as usual with your normal  shampoo.  3.  After you shampoo, rinse your hair and body thoroughly to remove the shampoo.                             4.  Use CHG as you would any other liquid soap.  You can apply chg directly to the skin and wash.  Gently with a scrungie or clean washcloth.  5.  Apply the CHG Soap to your body ONLY FROM THE NECK DOWN.   Do   not use on face/ open                           Wound or open sores. Avoid contact with eyes, ears mouth and   genitals (private parts).                       Wash face,  Genitals (private parts) with your normal soap.             6.  Wash thoroughly, paying special attention to the area where your    surgery  will be performed.  7.  Thoroughly rinse your body with warm water from the neck down.  8.  DO NOT shower/wash with your normal soap after using and rinsing off the CHG Soap.                9.  Pat yourself dry with a clean towel.            10.  Wear clean pajamas.            11.  Place clean sheets  on your bed the night of your first shower and do not  sleep with pets. Day of Surgery : Do not apply any lotions/deodorants the morning of surgery.  Please wear clean clothes to the hospital/surgery center.  FAILURE TO FOLLOW THESE INSTRUCTIONS MAY RESULT IN THE CANCELLATION OF YOUR SURGERY  PATIENT  SIGNATURE_________________________________  NURSE SIGNATURE__________________________________  ________________________________________________________________________    Adam Phenix  An incentive spirometer is a tool that can help keep your lungs clear and active. This tool measures how well you are filling your lungs with each breath. Taking long deep breaths may help reverse or decrease the chance of developing breathing (pulmonary) problems (especially infection) following:  A long period of time when you are unable to move or be active. BEFORE THE PROCEDURE   If the spirometer includes an indicator to show your best effort, your nurse or respiratory therapist will set it to a desired goal.  If possible, sit up straight or lean slightly forward. Try not to slouch.  Hold the incentive spirometer in an upright position. INSTRUCTIONS FOR USE  1. Sit on the edge of your bed if possible, or sit up as far as you can in bed or on a chair. 2. Hold the incentive spirometer in an upright position. 3. Breathe out normally. 4. Place the mouthpiece in your mouth and seal your lips tightly around it. 5. Breathe in slowly and as deeply as possible, raising the piston or the ball toward the top of the column. 6. Hold your breath for 3-5 seconds or for as long as possible. Allow the piston or ball to fall to the bottom of the column. 7. Remove the mouthpiece from your mouth and breathe out normally. 8. Rest for a few seconds and repeat Steps 1 through 7 at least 10 times every 1-2 hours when you are awake. Take your time and take a few normal breaths between deep breaths. 9. The spirometer may include an indicator to show your best effort. Use the indicator as a goal to work toward during each repetition. 10. After each set of 10 deep breaths, practice coughing to be sure your lungs are clear. If you have an incision (the cut made at the time of surgery), support your incision when coughing  by placing a pillow or rolled up towels firmly against it. Once you are able to get out of bed, walk around indoors and cough well. You may stop using the incentive spirometer when instructed by your caregiver.  RISKS AND COMPLICATIONS  Take your time so you do not get dizzy or light-headed.  If you are in pain, you may need to take or ask for pain medication before doing incentive spirometry. It is harder to take a deep breath if you are having pain. AFTER USE  Rest and breathe slowly and easily.  It can be helpful to keep track of a log of your progress. Your caregiver can provide you with a simple table to help with this. If you are using the spirometer at home, follow these instructions: [place] IF:   You are having difficultly using the spirometer.  You have trouble using the spirometer as often as instructed.  Your pain medication is not giving enough relief while using the spirometer.  You develop fever of 100.5 F (38.1 C) or higher. SEEK IMMEDIATE MEDICAL CARE IF:   You cough up bloody sputum that had not been present before.  You develop fever of 102 F (38.9 C) or greater.  You develop worsening pain at or near the incision site. MAKE SURE YOU:   Understand these instructions.  Will watch your condition.  Will get help right away if you are not doing well or get worse. Document Released: 07/29/2006 Document Revised: 06/10/2011 Document Reviewed: 09/29/2006 ExitCare Patient Information 2014 ExitCare, [place].   ________________________________________________________________________  WHAT IS A BLOOD TRANSFUSION? Blood Transfusion Information  A transfusion is the replacement of blood or some of its parts. Blood is made up of multiple cells which provide different functions.  Red blood cells carry oxygen and are used for blood loss replacement.  White blood cells fight against infection.  Platelets control bleeding.  Plasma helps clot  blood.  Other blood products are available for specialized needs, such as hemophilia or other clotting disorders. BEFORE THE TRANSFUSION  Who gives blood for transfusions?   Healthy volunteers who are fully evaluated to make sure their blood is safe. This is blood bank blood. Transfusion therapy is the safest it has ever been in the practice of medicine. Before blood is taken from a donor, a complete history is taken to make sure that person has no history of diseases nor engages in risky social behavior (examples are intravenous drug use or sexual activity with multiple partners). The donor's travel history is screened to minimize risk of transmitting infections, such as malaria. The donated blood is tested for signs of infectious diseases, such as HIV and hepatitis. The blood is then tested to be sure it is compatible with you in order to minimize the chance of a transfusion reaction. If you or a relative donates blood, this is often done in anticipation of surgery and is not appropriate for emergency situations. It takes many days to process the donated blood. RISKS AND COMPLICATIONS Although transfusion therapy is very safe and saves many lives, the main dangers of transfusion include:   Getting an infectious disease.  Developing a transfusion reaction. This is an allergic reaction to something in the blood you were given. Every precaution is taken to prevent this. The decision to have a blood transfusion has been considered carefully by your caregiver before blood is given. Blood is not given unless the benefits outweigh the risks. AFTER THE TRANSFUSION  Right after receiving a blood transfusion, you will usually feel much better and more energetic. This is especially true if your red blood cells have gotten low (anemic). The transfusion raises the level of the red blood cells which carry oxygen, and this usually causes an energy increase.  The nurse administering the transfusion will monitor  you carefully for complications. HOME CARE INSTRUCTIONS  No special instructions are needed after a transfusion. You may find your energy is better. Speak with your caregiver about any limitations on activity for underlying diseases you may have. SEEK MEDICAL CARE IF:   Your condition is not improving after your transfusion.  You develop redness or irritation at the intravenous (IV) site. SEEK IMMEDIATE MEDICAL CARE IF:  Any of the following symptoms occur over the next 12 hours:  Shaking chills.  You have a temperature by mouth above 102 F (38.9 C), not controlled by medicine.  Chest, back, or muscle pain.  People around you feel you are not acting correctly or are confused.  Shortness of breath or difficulty breathing.  Dizziness and fainting.  You get a rash or develop hives.  You have a decrease in urine output.  Your urine turns a dark color or changes to pink, red, or brown. Any  of the following symptoms occur over the next 10 days:  You have a temperature by mouth above 102 F (38.9 C), not controlled by medicine.  Shortness of breath.  Weakness after normal activity.  The white part of the eye turns yellow (jaundice).  You have a decrease in the amount of urine or are urinating less often.  Your urine turns a dark color or changes to pink, red, or brown. Document Released: 03/15/2000 Document Revised: 06/10/2011 Document Reviewed: 11/02/2007 [facility] Patient Information 2014 [place], [place].  _______________________________________________________________________

## 2020-06-07 ENCOUNTER — Encounter: Payer: Self-pay | Admitting: *Deleted

## 2020-06-07 ENCOUNTER — Telehealth: Payer: Self-pay | Admitting: Nurse Practitioner

## 2020-06-07 NOTE — Telephone Encounter (Signed)
Scheduled follow-up appointment per 3/9 schedule message. Patient is aware. 

## 2020-06-07 NOTE — Progress Notes (Unsigned)
Dr. Benay Spice discussed case w/Dr. Julaine Hua at [facility]. Needs to see NP on 3/10 at 0945 or 1015. High priority scheduling message sent

## 2020-06-08 ENCOUNTER — Other Ambulatory Visit: Payer: Self-pay

## 2020-06-08 ENCOUNTER — Inpatient Hospital Stay: Payer: Medicaid Other | Attending: Oncology | Admitting: Nurse Practitioner

## 2020-06-08 ENCOUNTER — Encounter: Payer: Self-pay | Admitting: Nurse Practitioner

## 2020-06-08 VITALS — BP 144/81 | HR 89 | Temp 97.8°F | Resp 20 | Ht 73.0 in | Wt 244.1 lb

## 2020-06-08 DIAGNOSIS — C20 Malignant neoplasm of rectum: Secondary | ICD-10-CM

## 2020-06-08 DIAGNOSIS — Z9221 Personal history of antineoplastic chemotherapy: Secondary | ICD-10-CM | POA: Diagnosis not present

## 2020-06-08 DIAGNOSIS — Z5111 Encounter for antineoplastic chemotherapy: Secondary | ICD-10-CM | POA: Insufficient documentation

## 2020-06-08 DIAGNOSIS — Z452 Encounter for adjustment and management of vascular access device: Secondary | ICD-10-CM | POA: Diagnosis not present

## 2020-06-08 DIAGNOSIS — G629 Polyneuropathy, unspecified: Secondary | ICD-10-CM | POA: Diagnosis not present

## 2020-06-08 DIAGNOSIS — I10 Essential (primary) hypertension: Secondary | ICD-10-CM | POA: Diagnosis not present

## 2020-06-08 DIAGNOSIS — Z923 Personal history of irradiation: Secondary | ICD-10-CM | POA: Insufficient documentation

## 2020-06-08 NOTE — Progress Notes (Addendum)
[facility] OFFICE PROGRESS NOTE   Diagnosis:  Rectal cancer  INTERVAL HISTORY:   [name] returns for follow-up.  He feels well.  No nausea or vomiting.  No mouth sores.  Bowels moving regularly. No blood or pain with bowel movements.  Objective:  Vital signs in last 24 hours:  Blood pressure (!) 144/81, pulse 89, temperature 97.8 F (36.6 C), temperature source Tympanic, resp. rate 20, height 6\' 1"  (1.854 m), weight 244 lb 1.6 oz (110.7 kg), SpO2 98 %.    HEENT: No thrush or ulcers. Resp: Lungs clear bilaterally. Cardio: Regular rate and rhythm. GI: Abdomen soft and nontender.  No hepatomegaly. Vascular: No leg edema.   Lab Results:  Lab Results  Component Value Date   WBC 9.9 05/21/2020   HGB 12.6 (L) 05/21/2020   HCT 40.7 05/21/2020   MCV 96.4 05/21/2020   PLT 209 05/21/2020   NEUTROABS 8.9 (H) 05/01/2020    Imaging:  No results found.  Medications: I have reviewed the patient's current medications.  Assessment/Plan: 1. Rectal cancer-rectal mass at 5-6 cm from the anal verge on colonoscopy 01/11/2020, biopsy confirmed adenocarcinoma ? CTs 01/11/2020-rectal thickening, small perirectal lymph nodes and external iliac nodes, moderate right pleural effusion ? MRI pelvis 01/27/2020-[id number] tumor at 7.1 cm from the internal anal sphincter, multiple mesorectal nodes, indistinct left pelvic sidewall node and enlarged bilateral external iliac nodes ? PET scan 02/11/2020-hypermetabolic rectosigmoid mass, hypermetabolic mesorectal and sigmoid mesocolon lymph nodes, left external iliac/obturator node with mild hypermetabolism, 18 mm left inguinal node with normal architecture and slight hypermetabolism ? Radiation and infusional 5-FU 02/21/2020-04/04/2020 ? CTs 05/09/2020-decreased perirectal and sigmoid mesocolon lymphadenopathy, no evidence of disease progression ? MRI pelvis 05/17/2020-no well-defined residual rectal mass seen.  Wall appears slightly  irregular.  No perirectal extension of tumor.  Again identified is perirectal adenopathy, similar to most recent CT 05/10/2020 but improved compared to PET/CT of 02/11/2020. 2. Anemia 3. Chronic renal failure 4. Congestive heart failure-severe LVH 5. Diabetes 6. Gout 7. Peripheral neuropathy? 8. Hypertension  Disposition: [name] appears stable.  He saw Dr. Hester Mates at [facility] for another surgical opinion on 06/06/2020.  Dr. Zollie Scale plan is for LAR with diverting loop colostomy with recommendation for continuation of chemotherapy prior to primary resection.  We discussed FOLFOX chemotherapy.  We reviewed potential toxicities associated with chemotherapy including bone marrow toxicity, nausea, hair loss, allergic reaction.  We discussed the various forms of neuropathy associated with oxaliplatin including cold sensitivity, peripheral neuropathy, acute laryngopharyngeal dysesthesia, diplopia, ataxia, incontinence, jaw pain.  He has baseline neuropathy symptoms and understands there is potential that this will worsen.  We reviewed the potential side effects associated with 5-fluorouracil including mouth sores, diarrhea, hand/foot syndrome, skin rash, skin hyperpigmentation, increase sensitivity to sun.  He understands a PICC line or Port-A-Cath is necessary for this regimen.  He would like to have a PICC line and agrees to proceed with chemotherapy as above.  We referred him for placement of a PICC line early next week, cycle 1 FOLFOX 06/13/2020.  We will see him in follow-up prior to cycle 2 on 06/27/2020.  Patient seen with Dr. Benay Spice.    [name] ANP/GNP-BC   06/08/2020  10:58 AM This was a shared visit with [name].  [name] completed neoadjuvant 5-FU and radiation.  He was seen for another surgical opinion by Dr. Hester Mates for another surgical opinion.  Dr.Mantyh recommends additional chemotherapy prior to a low anterior resection and diverting loop colostomy.  We discussed FOLFOX  chemotherapy with [name].  We reviewed the potential toxicities associated with this regimen including the increased chance of toxicity in the setting of renal failure.  He agrees to proceed.  The plan is to begin FOLFOX on 06/13/2020.  We will communicate with Dr. Hester Mates regarding the radiation completion date and timing of surgery.  I discussed the case with Dr. Rigoberto Noel yesterday. A chemotherapy plan was entered today.  I was present for greater than 50% of today's visit.  I perform medical decision making.  [name], MD

## 2020-06-08 NOTE — Progress Notes (Signed)
DISCONTINUE OFF PATHWAY REGIMEN - Colorectal   Custom Intervention:Medical: [Fluorouracil]:     Fluorouracil   **Always confirm dose/schedule in your pharmacy ordering system**  REASON: Other Reason PRIOR TREATMENT: Off Pathway: Medical: [Fluorouracil] TREATMENT RESPONSE: Partial Response (PR)  START ON PATHWAY REGIMEN - Colorectal     A cycle is every 14 days:     Oxaliplatin      Leucovorin      Fluorouracil      Fluorouracil   **Always confirm dose/schedule in your pharmacy ordering system**  Patient Characteristics: Preoperative or Nonsurgical Candidate (Clinical Staging), Rectal, cT3 - cT4, cN0 or Any cT, cN+ Tumor Location: Rectal Therapeutic Status: Preoperative or Nonsurgical Candidate (Clinical Staging) AJCC T Category: Staged < 8th Ed. AJCC N Category: Staged < 8th Ed. AJCC M Category: Staged < 8th Ed. AJCC 8 Stage Grouping: Staged < 8th Ed. Intent of Therapy: Curative Intent, Not Discussed with Patient

## 2020-06-09 ENCOUNTER — Telehealth: Payer: Self-pay | Admitting: Nurse Practitioner

## 2020-06-09 NOTE — Telephone Encounter (Signed)
Scheduled appointments per 3/10 los. Spoke to patient who is aware of appointments dates and times.  

## 2020-06-11 ENCOUNTER — Other Ambulatory Visit: Payer: Self-pay | Admitting: Oncology

## 2020-06-12 ENCOUNTER — Inpatient Hospital Stay (HOSPITAL_COMMUNITY): Admission: RE | Admit: 2020-06-12 | Payer: Medicaid Other | Source: Ambulatory Visit

## 2020-06-12 ENCOUNTER — Telehealth: Payer: Self-pay | Admitting: *Deleted

## 2020-06-12 ENCOUNTER — Other Ambulatory Visit (HOSPITAL_COMMUNITY): Payer: Self-pay | Admitting: Oncology

## 2020-06-12 DIAGNOSIS — C20 Malignant neoplasm of rectum: Secondary | ICD-10-CM

## 2020-06-12 NOTE — Telephone Encounter (Addendum)
IR was not able to reach patient to provide date/time for PICC line,so he was a "no show" today. Rescheduled for 06/13/20 at 0800/0830 at [facility] and patient notified. Still trying to work on the chemo appointment. Unable to treat at [facility] tomorrow or Wednesday. Awaiting scheduling for [facility] or [employer] office this week.

## 2020-06-13 ENCOUNTER — Inpatient Hospital Stay: Payer: Medicaid Other

## 2020-06-13 ENCOUNTER — Ambulatory Visit (HOSPITAL_COMMUNITY)
Admission: RE | Admit: 2020-06-13 | Discharge: 2020-06-13 | Disposition: A | Discharge status: Home / Self Care | Payer: Medicaid Other | Source: Ambulatory Visit | Attending: Oncology | Admitting: Oncology

## 2020-06-13 ENCOUNTER — Telehealth: Payer: Self-pay | Admitting: Nurse Practitioner

## 2020-06-13 ENCOUNTER — Other Ambulatory Visit: Payer: Self-pay

## 2020-06-13 ENCOUNTER — Encounter (HOSPITAL_COMMUNITY)
Admission: RE | Admit: 2020-06-13 | Discharge: 2020-06-13 | Disposition: A | Discharge status: Home / Self Care | Payer: Medicaid Other | Source: Ambulatory Visit | Attending: Oncology | Admitting: Oncology

## 2020-06-13 DIAGNOSIS — C20 Malignant neoplasm of rectum: Secondary | ICD-10-CM | POA: Insufficient documentation

## 2020-06-13 MED ORDER — LIDOCAINE HCL 1 % IJ SOLN
INTRAMUSCULAR | Status: AC
  Administered 2020-06-13: 5 mL via SUBCUTANEOUS
  Filled 2020-06-13: qty 20

## 2020-06-13 MED ORDER — HEPARIN SOD (PORK) LOCK FLUSH 100 UNIT/ML IV SOLN
INTRAVENOUS | Status: AC
  Administered 2020-06-13: 5 [IU]
  Filled 2020-06-13: qty 5

## 2020-06-13 NOTE — Telephone Encounter (Signed)
Scheduled per 03/14 scheduled message, patient is notified of upcoming 03/16 appointments.

## 2020-06-13 NOTE — Procedures (Signed)
PROCEDURE SUMMARY:  Successful placement of image-guided double lumen PICC line to the right basilic vein. Length 41 cm. Tip at lower SVC/RA. No complications. EBL = <3 ml. Ready for use.  Please see imaging section of Epic for full dictation.   Theresa Duty, NP 06/13/2020 9:02 AM

## 2020-06-14 ENCOUNTER — Other Ambulatory Visit: Payer: Self-pay

## 2020-06-14 ENCOUNTER — Inpatient Hospital Stay: Payer: Medicaid Other

## 2020-06-14 ENCOUNTER — Ambulatory Visit: Payer: Medicaid Other

## 2020-06-14 VITALS — BP 173/109 | HR 84 | Temp 98.6°F | Resp 18 | Wt 243.5 lb

## 2020-06-14 DIAGNOSIS — C20 Malignant neoplasm of rectum: Secondary | ICD-10-CM

## 2020-06-14 DIAGNOSIS — Z5111 Encounter for antineoplastic chemotherapy: Secondary | ICD-10-CM | POA: Diagnosis not present

## 2020-06-14 DIAGNOSIS — Z452 Encounter for adjustment and management of vascular access device: Secondary | ICD-10-CM

## 2020-06-14 LAB — CBC WITH DIFFERENTIAL (CANCER CENTER ONLY)
Abs Immature Granulocytes: 0.05 10*3/uL (ref 0.00–0.07)
Basophils Absolute: 0 10*3/uL (ref 0.0–0.1)
Basophils Relative: 0 %
Eosinophils Absolute: 0.5 10*3/uL (ref 0.0–0.5)
Eosinophils Relative: 5 %
HCT: 35.4 % — ABNORMAL LOW (ref 39.0–52.0)
Hemoglobin: 11.4 g/dL — ABNORMAL LOW (ref 13.0–17.0)
Immature Granulocytes: 1 %
Lymphocytes Relative: 7 %
Lymphs Abs: 0.7 10*3/uL (ref 0.7–4.0)
MCH: 29.3 pg (ref 26.0–34.0)
MCHC: 32.2 g/dL (ref 30.0–36.0)
MCV: 91 fL (ref 80.0–100.0)
Monocytes Absolute: 0.9 10*3/uL (ref 0.1–1.0)
Monocytes Relative: 9 %
Neutro Abs: 8.1 10*3/uL — ABNORMAL HIGH (ref 1.7–7.7)
Neutrophils Relative %: 78 %
Platelet Count: 227 10*3/uL (ref 150–400)
RBC: 3.89 MIL/uL — ABNORMAL LOW (ref 4.22–5.81)
RDW: 14.1 % (ref 11.5–15.5)
WBC Count: 10.3 10*3/uL (ref 4.0–10.5)
nRBC: 0 % (ref 0.0–0.2)

## 2020-06-14 LAB — CMP (CANCER CENTER ONLY)
ALT: 19 U/L (ref 0–44)
AST: 17 U/L (ref 15–41)
Albumin: 3.3 g/dL — ABNORMAL LOW (ref 3.5–5.0)
Alkaline Phosphatase: 127 U/L — ABNORMAL HIGH (ref 38–126)
Anion gap: 10 (ref 5–15)
BUN: 36 mg/dL — ABNORMAL HIGH (ref 6–20)
CO2: 25 mmol/L (ref 22–32)
Calcium: 8.7 mg/dL — ABNORMAL LOW (ref 8.9–10.3)
Chloride: 103 mmol/L (ref 98–111)
Creatinine: 3.26 mg/dL (ref 0.61–1.24)
GFR, Estimated: 21 mL/min — ABNORMAL LOW (ref >60)
Glucose, Bld: 188 mg/dL — ABNORMAL HIGH (ref 70–99)
Potassium: 4.4 mmol/L (ref 3.5–5.1)
Sodium: 138 mmol/L (ref 135–145)
Total Bilirubin: 0.8 mg/dL (ref 0.3–1.2)
Total Protein: 7.8 g/dL (ref 6.5–8.1)

## 2020-06-14 LAB — CEA (IN HOUSE-CHCC): CEA (CHCC-In House): 2.53 ng/mL (ref 0.00–5.00)

## 2020-06-14 MED ORDER — HEPARIN SOD (PORK) LOCK FLUSH 100 UNIT/ML IV SOLN
250.0000 [IU] | Freq: Once | INTRAVENOUS | Status: DC | PRN
  Filled 2020-06-14: qty 5

## 2020-06-14 MED ORDER — LEUCOVORIN CALCIUM INJECTION 350 MG
300.0000 mg/m2 | Freq: Once | INTRAVENOUS | Status: AC
  Administered 2020-06-14: 718 mg via INTRAVENOUS
  Filled 2020-06-14: qty 35.9

## 2020-06-14 MED ORDER — PALONOSETRON HCL INJECTION 0.25 MG/5ML
0.2500 mg | Freq: Once | INTRAVENOUS | Status: AC
  Administered 2020-06-14: 0.25 mg via INTRAVENOUS

## 2020-06-14 MED ORDER — OXALIPLATIN CHEMO INJECTION 100 MG/20ML
65.0000 mg/m2 | Freq: Once | INTRAVENOUS | Status: AC
  Administered 2020-06-14: 155 mg via INTRAVENOUS
  Filled 2020-06-14: qty 31

## 2020-06-14 MED ORDER — SODIUM CHLORIDE 0.9 % IV SOLN
10.0000 mg | Freq: Once | INTRAVENOUS | Status: AC
  Administered 2020-06-14: 10 mg via INTRAVENOUS
  Filled 2020-06-14: qty 10

## 2020-06-14 MED ORDER — DEXTROSE 5 % IV SOLN
Freq: Once | INTRAVENOUS | Status: AC
  Filled 2020-06-14: qty 250

## 2020-06-14 MED ORDER — PALONOSETRON HCL INJECTION 0.25 MG/5ML
INTRAVENOUS | Status: AC
  Filled 2020-06-14: qty 5

## 2020-06-14 MED ORDER — SODIUM CHLORIDE 0.9 % IV SOLN
2000.0000 mg/m2 | INTRAVENOUS | Status: DC
  Administered 2020-06-14: 4800 mg via INTRAVENOUS
  Filled 2020-06-14: qty 96

## 2020-06-14 MED ORDER — SODIUM CHLORIDE 0.9% FLUSH
10.0000 mL | INTRAVENOUS | Status: DC | PRN
  Filled 2020-06-14: qty 10

## 2020-06-14 MED ORDER — SODIUM CHLORIDE 0.9% FLUSH
10.0000 mL | Freq: Once | INTRAVENOUS | Status: AC
  Administered 2020-06-14: 10 mL
  Filled 2020-06-14: qty 10

## 2020-06-14 MED ORDER — FLUOROURACIL CHEMO INJECTION 2.5 GM/50ML
300.0000 mg/m2 | Freq: Once | INTRAVENOUS | Status: AC
  Administered 2020-06-14: 700 mg via INTRAVENOUS
  Filled 2020-06-14: qty 14

## 2020-06-14 NOTE — Patient Instructions (Signed)
PICC Home Care Guide A peripherally inserted central catheter (PICC) is a form of IV access that allows medicines and IV fluids to be quickly distributed throughout the body. The PICC is a long, thin, flexible tube (catheter) that is inserted into a vein in the upper arm. The catheter ends in a large vein in the chest (superior vena cava, or SVC). After the PICC is inserted, a chest X-ray may be done to make sure that it is in the correct place. A PICC may be placed for different reasons, such as:  To give medicines and liquid nutrition.  To give IV fluids and blood products.  If there is trouble placing a peripheral intravenous (PIV) catheter. If taken care of properly, a PICC can remain in place for several months. Having a PICC can also allow a person to go home from the hospital sooner. Medicine and PICC care can be managed at home by a family member, caregiver, or home health care team. What are the risks? Generally, having a PICC is safe. However, problems may occur, including:  A blood clot (thrombus) forming in or at the tip of the PICC.  A blood clot forming in a vein (deep vein thrombosis) or traveling to the lung (pulmonary embolism).  Inflammation of the vein (phlebitis) in which the PICC is placed.  Infection. Central line associated blood stream infection (CLABSI) is a serious infection that often requires hospitalization.  PICC movement (malposition). The PICC tip may move from its original position due to excessive physical activity, forceful coughing, sneezing, or vomiting.  A break or cut in the PICC. It is important not to use scissors near the PICC.  Nerve or tendon irritation or injury during PICC insertion. How to take care of your PICC Preventing problems  You and any caregivers should wash your hands often with soap. Wash hands: ? Before touching the PICC line or the infusion device. ? Before changing a bandage (dressing).  Flush the PICC as told by your  health care provider. Let your health care provider know right away if the PICC is hard to flush or does not flush. Do not use force to flush the PICC.  Do not use a syringe that is less than 10 mL to flush the PICC.  Avoid blood pressure checks on the arm in which the PICC is placed.  Never pull or tug on the PICC.  Do not take the PICC out yourself. Only a trained clinical professional should remove the PICC.  Use clean and sterile supplies only. Keep the supplies in a dry place. Do not reuse needles, syringes, or any other supplies. Doing that can lead to infection.  Keep pets and children away from your PICC line.  Check the PICC insertion site every day for signs of infection. Check for: ? Leakage. ? Redness, swelling, or pain. ? Fluid or blood. ? Warmth. ? Pus or a bad smell. PICC dressing care  Keep your PICC bandage (dressing) clean and dry to prevent infection.  Do not take baths, swim, or use a hot tub until your health care provider approves. Ask your health care provider if you can take showers. You may only be allowed to take sponge baths for bathing. When you are allowed to shower: ? Ask your health care provider to teach you how to wrap the PICC line. ? Cover the PICC line with clear plastic wrap and tape to keep it dry while showering.  Follow instructions from your health care provider about   how to take care of your insertion site and dressing. Make sure you: ? Wash your hands with soap and water before you change your bandage (dressing). If soap and water are not available, use hand sanitizer. ? Change your dressing as told by your health care provider. ? Leave stitches (sutures), skin glue, or adhesive strips in place. These skin closures may need to stay in place for 2 weeks or longer. If adhesive strip edges start to loosen and curl up, you may trim the loose edges. Do not remove adhesive strips completely unless your health care provider tells you to do  that.  Change your PICC dressing if it becomes loose or wet. General instructions  Carry your PICC identification card or wear a medical alert bracelet at all times.  Keep the tube clamped at all times, unless it is being used.  Carry a smooth-edge clamp with you at all times to place on the tube if it breaks.  Do not use scissors or sharp objects near the tube.  You may bend your arm and move it freely. If your PICC is near or at the bend of your elbow, avoid activity with repeated motion at the elbow.  Avoid lifting heavy objects as told by your health care provider.  Keep all follow-up visits as told by your health care provider. This is important.   Disposal of supplies  Throw away any syringes in a disposal container that is meant for sharp items (sharps container). You can buy a sharps container from a pharmacy, or you can make one by using an empty hard plastic bottle with a cover.  Place any used dressings or infusion bags into a plastic bag. Throw that bag in the trash. Contact a health care provider if:  You have pain in your arm, ear, face, or teeth.  You have a fever or chills.  You have redness, swelling, or pain around the insertion site.  You have fluid or blood coming from the insertion site.  Your insertion site feels warm to the touch.  You have pus or a bad smell coming from the insertion site.  Your skin feels hard and raised around the insertion site. Get help right away if:  Your PICC is accidentally pulled all the way out. If this happens, cover the insertion site with a bandage or gauze dressing. Do not throw the PICC away. Your health care provider will need to check it.  Your PICC was tugged or pulled and has partially come out. Do not  push the PICC back in.  You cannot flush the PICC, it is hard to flush, or the PICC leaks around the insertion site when it is flushed.  You hear a "flushing" sound when the PICC is flushed.  You feel your  heart racing or skipping beats.  There is a hole or tear in the PICC.  You have swelling in the arm in which the PICC was inserted.  You have a red streak going up your arm from where the PICC was inserted. Summary  A peripherally inserted central catheter (PICC) is a long, thin, flexible tube (catheter) that is inserted into a vein in the upper arm.  The PICC is inserted using a sterile technique by a specially trained nurse or physician. Only a trained clinical professional should remove it.  Keep your PICC identification card with you at all times.  Avoid blood pressure checks on the arm in which the PICC is placed.  If cared for   properly, a PICC can remain in place for several months. Having a PICC can also allow a person to go home from the hospital sooner. This information is not intended to replace advice given to you by your health care provider. Make sure you discuss any questions you have with your health care provider. Document Revised: 07/28/2019 Document Reviewed: 07/28/2019 Elsevier Patient Education  2021 [employer]  

## 2020-06-14 NOTE — Patient Instructions (Signed)
Oxaliplatin Injection What is this medicine? OXALIPLATIN (ox AL i PLA tin) is a chemotherapy drug. It targets fast dividing cells, like cancer cells, and causes these cells to die. This medicine is used to treat cancers of the colon and rectum, and many other cancers. This medicine may be used for other purposes; ask your health care provider or pharmacist if you have questions. COMMON BRAND NAME(S): Eloxatin What should I tell my health care provider before I take this medicine? They need to know if you have any of these conditions:  heart disease  history of irregular heartbeat  liver disease  low blood counts, like white cells, platelets, or red blood cells  lung or breathing disease, like asthma  take medicines that treat or prevent blood clots  tingling of the fingers or toes, or other nerve disorder  an unusual or allergic reaction to oxaliplatin, other chemotherapy, other medicines, foods, dyes, or preservatives  pregnant or trying to get pregnant  breast-feeding How should I use this medicine? This drug is given as an infusion into a vein. It is administered in a hospital or clinic by a specially trained health care professional. Talk to your pediatrician regarding the use of this medicine in children. Special care may be needed. Overdosage: If you think you have taken too much of this medicine contact a poison control center or emergency room at once. NOTE: This medicine is only for you. Do not share this medicine with others. What if I miss a dose? It is important not to miss a dose. Call your doctor or health care professional if you are unable to keep an appointment. What may interact with this medicine? Do not take this medicine with any of the following medications:  cisapride  dronedarone  pimozide  thioridazine This medicine may also interact with the following medications:  aspirin and aspirin-like medicines  certain medicines that treat or prevent  blood clots like warfarin, apixaban, dabigatran, and rivaroxaban  cisplatin  cyclosporine  diuretics  medicines for infection like acyclovir, adefovir, amphotericin B, bacitracin, cidofovir, foscarnet, ganciclovir, gentamicin, pentamidine, vancomycin  NSAIDs, medicines for pain and inflammation, like ibuprofen or naproxen  other medicines that prolong the QT interval (an abnormal heart rhythm)  pamidronate  zoledronic acid This list may not describe all possible interactions. Give your health care provider a list of all the medicines, herbs, non-prescription drugs, or dietary supplements you use. Also tell them if you smoke, drink alcohol, or use illegal drugs. Some items may interact with your medicine. What should I watch for while using this medicine? Your condition will be monitored carefully while you are receiving this medicine. You may need blood work done while you are taking this medicine. This medicine may make you feel generally unwell. This is not uncommon as chemotherapy can affect healthy cells as well as cancer cells. Report any side effects. Continue your course of treatment even though you feel ill unless your healthcare professional tells you to stop. This medicine can make you more sensitive to cold. Do not drink cold drinks or use ice. Cover exposed skin before coming in contact with cold temperatures or cold objects. When out in cold weather wear warm clothing and cover your mouth and nose to warm the air that goes into your lungs. Tell your doctor if you get sensitive to the cold. Do not become pregnant while taking this medicine or for 9 months after stopping it. Women should inform their health care professional if they wish to become   pregnant or think they might be pregnant. Men should not father a child while taking this medicine and for 6 months after stopping it. There is potential for serious side effects to an unborn child. Talk to your health care professional  for more information. Do not breast-feed a child while taking this medicine or for 3 months after stopping it. This medicine has caused ovarian failure in some women. This medicine may make it more difficult to get pregnant. Talk to your health care professional if you are concerned about your fertility. This medicine has caused decreased sperm counts in some men. This may make it more difficult to father a child. Talk to your health care professional if you are concerned about your fertility. This medicine may increase your risk of getting an infection. Call your health care professional for advice if you get a fever, chills, or sore throat, or other symptoms of a cold or flu. Do not treat yourself. Try to avoid being around people who are sick. Avoid taking medicines that contain aspirin, acetaminophen, ibuprofen, naproxen, or ketoprofen unless instructed by your health care professional. These medicines may hide a fever. Be careful brushing or flossing your teeth or using a toothpick because you may get an infection or bleed more easily. If you have any dental work done, tell your dentist you are receiving this medicine. What side effects may I notice from receiving this medicine? Side effects that you should report to your doctor or health care professional as soon as possible:  allergic reactions like skin rash, itching or hives, swelling of the face, lips, or tongue  breathing problems  cough  low blood counts - this medicine may decrease the number of white blood cells, red blood cells, and platelets. You may be at increased risk for infections and bleeding  nausea, vomiting  pain, redness, or irritation at site where injected  pain, tingling, numbness in the hands or feet  signs and symptoms of bleeding such as bloody or black, tarry stools; red or dark brown urine; spitting up blood or brown material that looks like coffee grounds; red spots on the skin; unusual bruising or bleeding  from the eyes, gums, or nose  signs and symptoms of a dangerous change in heartbeat or heart rhythm like chest pain; dizziness; fast, irregular heartbeat; palpitations; feeling faint or lightheaded; falls  signs and symptoms of infection like fever; chills; cough; sore throat; pain or trouble passing urine  signs and symptoms of liver injury like dark yellow or brown urine; general ill feeling or flu-like symptoms; light-colored stools; loss of appetite; nausea; right upper belly pain; unusually weak or tired; yellowing of the eyes or skin  signs and symptoms of low red blood cells or anemia such as unusually weak or tired; feeling faint or lightheaded; falls  signs and symptoms of muscle injury like dark urine; trouble passing urine or change in the amount of urine; unusually weak or tired; muscle pain; back pain Side effects that usually do not require medical attention (report to your doctor or health care professional if they continue or are bothersome):  changes in taste  diarrhea  gas  hair loss  loss of appetite  mouth sores This list may not describe all possible side effects. Call your doctor for medical advice about side effects. You may report side effects to FDA at 1-800-FDA-1088. Where should I keep my medicine? This drug is given in a hospital or clinic and will not be stored at home. NOTE:   This sheet is a summary. It may not cover all possible information. If you have questions about this medicine, talk to your doctor, pharmacist, or health care provider.  2021 Elsevier/[employer] Standard (2018-08-05 12:20:35) Leucovorin injection What is this medicine? LEUCOVORIN (loo koe VOR in) is used to prevent or treat the harmful effects of some medicines. This medicine is used to treat anemia caused by a low amount of folic acid in the body. It is also used with 5-fluorouracil (5-FU) to treat colon cancer. This medicine may be used for other purposes; ask your health care provider  or pharmacist if you have questions. What should I tell my health care provider before I take this medicine? They need to know if you have any of these conditions:  anemia from low levels of vitamin B-12 in the blood  an unusual or allergic reaction to leucovorin, folic acid, other medicines, foods, dyes, or preservatives  pregnant or trying to get pregnant  breast-feeding How should I use this medicine? This medicine is for injection into a muscle or into a vein. It is given by a health care professional in a hospital or clinic setting. Talk to your pediatrician regarding the use of this medicine in children. Special care may be needed. Overdosage: If you think you have taken too much of this medicine contact a poison control center or emergency room at once. NOTE: This medicine is only for you. Do not share this medicine with others. What if I miss a dose? This does not apply. What may interact with this medicine?  capecitabine  fluorouracil  phenobarbital  phenytoin  primidone  trimethoprim-sulfamethoxazole This list may not describe all possible interactions. Give your health care provider a list of all the medicines, herbs, non-prescription drugs, or dietary supplements you use. Also tell them if you smoke, drink alcohol, or use illegal drugs. Some items may interact with your medicine. What should I watch for while using this medicine? Your condition will be monitored carefully while you are receiving this medicine. This medicine may increase the side effects of 5-fluorouracil, 5-FU. Tell your doctor or health care professional if you have diarrhea or mouth sores that do not get better or that get worse. What side effects may I notice from receiving this medicine? Side effects that you should report to your doctor or health care professional as soon as possible:  allergic reactions like skin rash, itching or hives, swelling of the face, lips, or tongue  breathing  problems  fever, infection  mouth sores  unusual bleeding or bruising  unusually weak or tired Side effects that usually do not require medical attention (report to your doctor or health care professional if they continue or are bothersome):  constipation or diarrhea  loss of appetite  nausea, vomiting This list may not describe all possible side effects. Call your doctor for medical advice about side effects. You may report side effects to FDA at 1-800-FDA-1088. Where should I keep my medicine? This drug is given in a hospital or clinic and will not be stored at home. NOTE: This sheet is a summary. It may not cover all possible information. If you have questions about this medicine, talk to your doctor, pharmacist, or health care provider.  2021 Elsevier/Gold Standard (2007-09-22 16:50:29) Fluorouracil, 5-FU injection What is this medicine? FLUOROURACIL, 5-FU (flure oh YOOR a sil) is a chemotherapy drug. It slows the growth of cancer cells. This medicine is used to treat many types of cancer like breast cancer, colon or   rectal cancer, pancreatic cancer, and stomach cancer. This medicine may be used for other purposes; ask your health care provider or pharmacist if you have questions. COMMON BRAND NAME(S): Adrucil What should I tell my health care provider before I take this medicine? They need to know if you have any of these conditions:  blood disorders  dihydropyrimidine dehydrogenase (DPD) deficiency  infection (especially a virus infection such as chickenpox, cold sores, or herpes)  kidney disease  liver disease  malnourished, poor nutrition  recent or ongoing radiation therapy  an unusual or allergic reaction to fluorouracil, other chemotherapy, other medicines, foods, dyes, or preservatives  pregnant or trying to get pregnant  breast-feeding How should I use this medicine? This drug is given as an infusion or injection into a vein. It is administered in a  hospital or clinic by a specially trained health care professional. Talk to your pediatrician regarding the use of this medicine in children. Special care may be needed. Overdosage: If you think you have taken too much of this medicine contact a poison control center or emergency room at once. NOTE: This medicine is only for you. Do not share this medicine with others. What if I miss a dose? It is important not to miss your dose. Call your doctor or health care professional if you are unable to keep an appointment. What may interact with this medicine? Do not take this medicine with any of the following medications:  live virus vaccines This medicine may also interact with the following medications:  medicines that treat or prevent blood clots like warfarin, enoxaparin, and dalteparin This list may not describe all possible interactions. Give your health care provider a list of all the medicines, herbs, non-prescription drugs, or dietary supplements you use. Also tell them if you smoke, drink alcohol, or use illegal drugs. Some items may interact with your medicine. What should I watch for while using this medicine? Visit your doctor for checks on your progress. This drug may make you feel generally unwell. This is not uncommon, as chemotherapy can affect healthy cells as well as cancer cells. Report any side effects. Continue your course of treatment even though you feel ill unless your doctor tells you to stop. In some cases, you may be given additional medicines to help with side effects. Follow all directions for their use. Call your doctor or health care professional for advice if you get a fever, chills or sore throat, or other symptoms of a cold or flu. Do not treat yourself. This drug decreases your body's ability to fight infections. Try to avoid being around people who are sick. This medicine may increase your risk to bruise or bleed. Call your doctor or health care professional if you  notice any unusual bleeding. Be careful brushing and flossing your teeth or using a toothpick because you may get an infection or bleed more easily. If you have any dental work done, tell your dentist you are receiving this medicine. Avoid taking products that contain aspirin, acetaminophen, ibuprofen, naproxen, or ketoprofen unless instructed by your doctor. These medicines may hide a fever. Do not become pregnant while taking this medicine. Women should inform their doctor if they wish to become pregnant or think they might be pregnant. There is a potential for serious side effects to an unborn child. Talk to your health care professional or pharmacist for more information. Do not breast-feed an infant while taking this medicine. Men should inform their doctor if they wish to father a   child. This medicine may lower sperm counts. Do not treat diarrhea with over the counter products. Contact your doctor if you have diarrhea that lasts more than 2 days or if it is severe and watery. This medicine can make you more sensitive to the sun. Keep out of the sun. If you cannot avoid being in the sun, wear protective clothing and use sunscreen. Do not use sun lamps or tanning beds/booths. What side effects may I notice from receiving this medicine? Side effects that you should report to your doctor or health care professional as soon as possible:  allergic reactions like skin rash, itching or hives, swelling of the face, lips, or tongue  low blood counts - this medicine may decrease the number of white blood cells, red blood cells and platelets. You may be at increased risk for infections and bleeding.  signs of infection - fever or chills, cough, sore throat, pain or difficulty passing urine  signs of decreased platelets or bleeding - bruising, pinpoint red spots on the skin, black, tarry stools, blood in the urine  signs of decreased red blood cells - unusually weak or tired, fainting spells,  lightheadedness  breathing problems  changes in vision  chest pain  mouth sores  nausea and vomiting  pain, swelling, redness at site where injected  pain, tingling, numbness in the hands or feet  redness, swelling, or sores on hands or feet  stomach pain  unusual bleeding Side effects that usually do not require medical attention (report to your doctor or health care professional if they continue or are bothersome):  changes in finger or toe nails  diarrhea  dry or itchy skin  hair loss  headache  loss of appetite  sensitivity of eyes to the light  stomach upset  unusually teary eyes This list may not describe all possible side effects. Call your doctor for medical advice about side effects. You may report side effects to FDA at 1-800-FDA-1088. Where should I keep my medicine? This drug is given in a hospital or clinic and will not be stored at home. NOTE: This sheet is a summary. It may not cover all possible information. If you have questions about this medicine, talk to your doctor, pharmacist, or health care provider.  2021 Elsevier/Gold Standard (2019-02-16 15:00:03)  

## 2020-06-15 ENCOUNTER — Inpatient Hospital Stay: Payer: Medicaid Other

## 2020-06-15 ENCOUNTER — Ambulatory Visit: Payer: Medicaid Other

## 2020-06-15 ENCOUNTER — Other Ambulatory Visit: Payer: Medicaid Other

## 2020-06-16 ENCOUNTER — Telehealth: Payer: Self-pay | Admitting: Nurse Practitioner

## 2020-06-16 ENCOUNTER — Telehealth: Payer: Self-pay

## 2020-06-16 ENCOUNTER — Other Ambulatory Visit (HOSPITAL_COMMUNITY): Payer: Medicaid Other

## 2020-06-16 ENCOUNTER — Other Ambulatory Visit: Payer: Self-pay

## 2020-06-16 ENCOUNTER — Inpatient Hospital Stay: Payer: Medicaid Other

## 2020-06-16 ENCOUNTER — Encounter: Payer: Self-pay | Admitting: *Deleted

## 2020-06-16 VITALS — BP 142/96 | HR 72 | Resp 18

## 2020-06-16 DIAGNOSIS — C20 Malignant neoplasm of rectum: Secondary | ICD-10-CM

## 2020-06-16 DIAGNOSIS — Z5111 Encounter for antineoplastic chemotherapy: Secondary | ICD-10-CM | POA: Diagnosis not present

## 2020-06-16 MED ORDER — SODIUM CHLORIDE 0.9% FLUSH
10.0000 mL | INTRAVENOUS | Status: DC | PRN
  Administered 2020-06-16: 10 mL
  Filled 2020-06-16: qty 10

## 2020-06-16 MED ORDER — HEPARIN SOD (PORK) LOCK FLUSH 100 UNIT/ML IV SOLN
500.0000 [IU] | Freq: Once | INTRAVENOUS | Status: AC | PRN
  Administered 2020-06-16: 250 [IU]
  Filled 2020-06-16: qty 5

## 2020-06-16 NOTE — Progress Notes (Signed)
Scheduling message sent for PICC flush w/dressing change on 3/22 and PICC flush on 3/25

## 2020-06-16 NOTE — Progress Notes (Signed)
Patient stated that the pump died and he had to change the batteries. He also stated the he was bleeding from his rectum. He states that the bleeding has stopped now and only lasted overnight. Made MD aware.

## 2020-06-16 NOTE — Telephone Encounter (Signed)
Called Dr. Hester Mates office left message with answering service, as MD in surgery today, to please call/page  Dr. Benay Spice at [phone] answering service was able to confirm previous request for return call was delivered  Awaiting return call

## 2020-06-16 NOTE — Telephone Encounter (Signed)
Scheduled per 03/18 scheduled message, patient has been called and notified. 

## 2020-06-16 NOTE — Patient Instructions (Signed)

## 2020-06-20 ENCOUNTER — Inpatient Hospital Stay: Payer: Medicaid Other

## 2020-06-20 ENCOUNTER — Other Ambulatory Visit (HOSPITAL_COMMUNITY): Payer: Medicaid Other

## 2020-06-20 ENCOUNTER — Other Ambulatory Visit: Payer: Self-pay

## 2020-06-20 DIAGNOSIS — Z5111 Encounter for antineoplastic chemotherapy: Secondary | ICD-10-CM | POA: Diagnosis not present

## 2020-06-20 DIAGNOSIS — Z452 Encounter for adjustment and management of vascular access device: Secondary | ICD-10-CM

## 2020-06-20 DIAGNOSIS — C20 Malignant neoplasm of rectum: Secondary | ICD-10-CM

## 2020-06-20 MED ORDER — SODIUM CHLORIDE 0.9% FLUSH
10.0000 mL | Freq: Once | INTRAVENOUS | Status: AC
  Administered 2020-06-20: 10 mL
  Filled 2020-06-20: qty 10

## 2020-06-20 MED ORDER — HEPARIN SOD (PORK) LOCK FLUSH 100 UNIT/ML IV SOLN
500.0000 [IU] | Freq: Once | INTRAVENOUS | Status: AC
  Administered 2020-06-20: 500 [IU]
  Filled 2020-06-20: qty 5

## 2020-06-20 NOTE — Patient Instructions (Signed)
PICC Home Care Guide A peripherally inserted central catheter (PICC) is a form of IV access that allows medicines and IV fluids to be quickly distributed throughout the body. The PICC is a long, thin, flexible tube (catheter) that is inserted into a vein in the upper arm. The catheter ends in a large vein in the chest (superior vena cava, or SVC). After the PICC is inserted, a chest X-ray may be done to make sure that it is in the correct place. A PICC may be placed for different reasons, such as:  To give medicines and liquid nutrition.  To give IV fluids and blood products.  If there is trouble placing a peripheral intravenous (PIV) catheter. If taken care of properly, a PICC can remain in place for several months. Having a PICC can also allow a person to go home from the hospital sooner. Medicine and PICC care can be managed at home by a family member, caregiver, or home health care team. What are the risks? Generally, having a PICC is safe. However, problems may occur, including:  A blood clot (thrombus) forming in or at the tip of the PICC.  A blood clot forming in a vein (deep vein thrombosis) or traveling to the lung (pulmonary embolism).  Inflammation of the vein (phlebitis) in which the PICC is placed.  Infection. Central line associated blood stream infection (CLABSI) is a serious infection that often requires hospitalization.  PICC movement (malposition). The PICC tip may move from its original position due to excessive physical activity, forceful coughing, sneezing, or vomiting.  A break or cut in the PICC. It is important not to use scissors near the PICC.  Nerve or tendon irritation or injury during PICC insertion. How to take care of your PICC Preventing problems  You and any caregivers should wash your hands often with soap. Wash hands: ? Before touching the PICC line or the infusion device. ? Before changing a bandage (dressing).  Flush the PICC as told by your  health care provider. Let your health care provider know right away if the PICC is hard to flush or does not flush. Do not use force to flush the PICC.  Do not use a syringe that is less than 10 mL to flush the PICC.  Avoid blood pressure checks on the arm in which the PICC is placed.  Never pull or tug on the PICC.  Do not take the PICC out yourself. Only a trained clinical professional should remove the PICC.  Use clean and sterile supplies only. Keep the supplies in a dry place. Do not reuse needles, syringes, or any other supplies. Doing that can lead to infection.  Keep pets and children away from your PICC line.  Check the PICC insertion site every day for signs of infection. Check for: ? Leakage. ? Redness, swelling, or pain. ? Fluid or blood. ? Warmth. ? Pus or a bad smell. PICC dressing care  Keep your PICC bandage (dressing) clean and dry to prevent infection.  Do not take baths, swim, or use a hot tub until your health care provider approves. Ask your health care provider if you can take showers. You may only be allowed to take sponge baths for bathing. When you are allowed to shower: ? Ask your health care provider to teach you how to wrap the PICC line. ? Cover the PICC line with clear plastic wrap and tape to keep it dry while showering.  Follow instructions from your health care provider about   how to take care of your insertion site and dressing. Make sure you: ? Wash your hands with soap and water before you change your bandage (dressing). If soap and water are not available, use hand sanitizer. ? Change your dressing as told by your health care provider. ? Leave stitches (sutures), skin glue, or adhesive strips in place. These skin closures may need to stay in place for 2 weeks or longer. If adhesive strip edges start to loosen and curl up, you may trim the loose edges. Do not remove adhesive strips completely unless your health care provider tells you to do  that.  Change your PICC dressing if it becomes loose or wet. General instructions  Carry your PICC identification card or wear a medical alert bracelet at all times.  Keep the tube clamped at all times, unless it is being used.  Carry a smooth-edge clamp with you at all times to place on the tube if it breaks.  Do not use scissors or sharp objects near the tube.  You may bend your arm and move it freely. If your PICC is near or at the bend of your elbow, avoid activity with repeated motion at the elbow.  Avoid lifting heavy objects as told by your health care provider.  Keep all follow-up visits as told by your health care provider. This is important.   Disposal of supplies  Throw away any syringes in a disposal container that is meant for sharp items (sharps container). You can buy a sharps container from a pharmacy, or you can make one by using an empty hard plastic bottle with a cover.  Place any used dressings or infusion bags into a plastic bag. Throw that bag in the trash. Contact a health care provider if:  You have pain in your arm, ear, face, or teeth.  You have a fever or chills.  You have redness, swelling, or pain around the insertion site.  You have fluid or blood coming from the insertion site.  Your insertion site feels warm to the touch.  You have pus or a bad smell coming from the insertion site.  Your skin feels hard and raised around the insertion site. Get help right away if:  Your PICC is accidentally pulled all the way out. If this happens, cover the insertion site with a bandage or gauze dressing. Do not throw the PICC away. Your health care provider will need to check it.  Your PICC was tugged or pulled and has partially come out. Do not  push the PICC back in.  You cannot flush the PICC, it is hard to flush, or the PICC leaks around the insertion site when it is flushed.  You hear a "flushing" sound when the PICC is flushed.  You feel your  heart racing or skipping beats.  There is a hole or tear in the PICC.  You have swelling in the arm in which the PICC was inserted.  You have a red streak going up your arm from where the PICC was inserted. Summary  A peripherally inserted central catheter (PICC) is a long, thin, flexible tube (catheter) that is inserted into a vein in the upper arm.  The PICC is inserted using a sterile technique by a specially trained nurse or physician. Only a trained clinical professional should remove it.  Keep your PICC identification card with you at all times.  Avoid blood pressure checks on the arm in which the PICC is placed.  If cared for   properly, a PICC can remain in place for several months. Having a PICC can also allow a person to go home from the hospital sooner. This information is not intended to replace advice given to you by your health care provider. Make sure you discuss any questions you have with your health care provider. Document Revised: 07/28/2019 Document Reviewed: 07/28/2019 Elsevier Patient Education  2021 [employer]  

## 2020-06-22 ENCOUNTER — Other Ambulatory Visit: Payer: Self-pay | Admitting: Oncology

## 2020-06-22 ENCOUNTER — Encounter: Payer: Self-pay | Admitting: Cardiovascular Disease

## 2020-06-23 ENCOUNTER — Encounter: Admission: RE | Payer: Self-pay | Source: Home / Self Care

## 2020-06-23 ENCOUNTER — Inpatient Hospital Stay: Payer: Medicaid Other

## 2020-06-23 ENCOUNTER — Other Ambulatory Visit: Payer: Self-pay

## 2020-06-23 ENCOUNTER — Inpatient Hospital Stay: Admission: RE | Admit: 2020-06-23 | Payer: Medicaid Other | Source: Home / Self Care | Admitting: General Surgery

## 2020-06-23 DIAGNOSIS — Z5111 Encounter for antineoplastic chemotherapy: Secondary | ICD-10-CM | POA: Diagnosis not present

## 2020-06-23 DIAGNOSIS — C20 Malignant neoplasm of rectum: Secondary | ICD-10-CM

## 2020-06-23 DIAGNOSIS — Z452 Encounter for adjustment and management of vascular access device: Secondary | ICD-10-CM

## 2020-06-23 SURGERY — RESECTION, RECTUM, LOW ANTERIOR, ROBOT-ASSISTED
Anesthesia: General

## 2020-06-23 MED ORDER — HEPARIN SOD (PORK) LOCK FLUSH 100 UNIT/ML IV SOLN
500.0000 [IU] | Freq: Once | INTRAVENOUS | Status: AC
  Administered 2020-06-23: 500 [IU]
  Filled 2020-06-23: qty 5

## 2020-06-23 MED ORDER — SODIUM CHLORIDE 0.9% FLUSH
10.0000 mL | Freq: Once | INTRAVENOUS | Status: AC
  Administered 2020-06-23: 10 mL
  Filled 2020-06-23: qty 10

## 2020-06-26 ENCOUNTER — Telehealth: Payer: Self-pay | Admitting: Nurse Practitioner

## 2020-06-26 NOTE — Telephone Encounter (Signed)
Scheduled appt per 3/28 sch msg. Attempted to call pt multiple times. No answer and no voicemail set up. Called pt's mother and left msg with her for pt to call back to confirm appts for tomorrow.

## 2020-06-27 ENCOUNTER — Other Ambulatory Visit: Payer: Self-pay

## 2020-06-27 ENCOUNTER — Inpatient Hospital Stay: Payer: Medicaid Other

## 2020-06-27 ENCOUNTER — Encounter: Payer: Self-pay | Admitting: Nurse Practitioner

## 2020-06-27 ENCOUNTER — Emergency Department (HOSPITAL_COMMUNITY)
Admission: EM | Admit: 2020-06-27 | Discharge: 2020-06-27 | Discharge status: Against Medical Advice | Payer: Medicaid Other | Attending: Emergency Medicine | Admitting: Emergency Medicine

## 2020-06-27 ENCOUNTER — Inpatient Hospital Stay (HOSPITAL_BASED_OUTPATIENT_CLINIC_OR_DEPARTMENT_OTHER): Payer: Medicaid Other | Admitting: Nurse Practitioner

## 2020-06-27 VITALS — BP 192/138 | HR 80 | Temp 98.3°F | Resp 17 | Wt 239.8 lb

## 2020-06-27 DIAGNOSIS — C20 Malignant neoplasm of rectum: Secondary | ICD-10-CM

## 2020-06-27 DIAGNOSIS — Z452 Encounter for adjustment and management of vascular access device: Secondary | ICD-10-CM

## 2020-06-27 DIAGNOSIS — Z5111 Encounter for antineoplastic chemotherapy: Secondary | ICD-10-CM | POA: Diagnosis not present

## 2020-06-27 LAB — CMP (CANCER CENTER ONLY)
ALT: 10 U/L (ref 0–44)
AST: 12 U/L — ABNORMAL LOW (ref 15–41)
Albumin: 2.7 g/dL — ABNORMAL LOW (ref 3.5–5.0)
Alkaline Phosphatase: 115 U/L (ref 38–126)
Anion gap: 9 (ref 5–15)
BUN: 39 mg/dL — ABNORMAL HIGH (ref 6–20)
CO2: 21 mmol/L — ABNORMAL LOW (ref 22–32)
Calcium: 6.7 mg/dL — ABNORMAL LOW (ref 8.9–10.3)
Chloride: 113 mmol/L — ABNORMAL HIGH (ref 98–111)
Creatinine: 2.45 mg/dL — ABNORMAL HIGH (ref 0.61–1.24)
GFR, Estimated: 30 mL/min — ABNORMAL LOW (ref >60)
Glucose, Bld: 95 mg/dL (ref 70–99)
Potassium: 3.8 mmol/L (ref 3.5–5.1)
Sodium: 143 mmol/L (ref 135–145)
Total Bilirubin: 0.4 mg/dL (ref 0.3–1.2)
Total Protein: 6 g/dL — ABNORMAL LOW (ref 6.5–8.1)

## 2020-06-27 LAB — CBC WITH DIFFERENTIAL (CANCER CENTER ONLY)
Abs Immature Granulocytes: 0.03 10*3/uL (ref 0.00–0.07)
Basophils Absolute: 0.1 10*3/uL (ref 0.0–0.1)
Basophils Relative: 1 %
Eosinophils Absolute: 0.3 10*3/uL (ref 0.0–0.5)
Eosinophils Relative: 4 %
HCT: 32.9 % — ABNORMAL LOW (ref 39.0–52.0)
Hemoglobin: 10.5 g/dL — ABNORMAL LOW (ref 13.0–17.0)
Immature Granulocytes: 0 %
Lymphocytes Relative: 10 %
Lymphs Abs: 0.9 10*3/uL (ref 0.7–4.0)
MCH: 29 pg (ref 26.0–34.0)
MCHC: 31.9 g/dL (ref 30.0–36.0)
MCV: 90.9 fL (ref 80.0–100.0)
Monocytes Absolute: 0.8 10*3/uL (ref 0.1–1.0)
Monocytes Relative: 9 %
Neutro Abs: 6.5 10*3/uL (ref 1.7–7.7)
Neutrophils Relative %: 76 %
Platelet Count: 168 10*3/uL (ref 150–400)
RBC: 3.62 MIL/uL — ABNORMAL LOW (ref 4.22–5.81)
RDW: 14.8 % (ref 11.5–15.5)
WBC Count: 8.5 10*3/uL (ref 4.0–10.5)
nRBC: 0 % (ref 0.0–0.2)

## 2020-06-27 MED ORDER — HEPARIN SOD (PORK) LOCK FLUSH 100 UNIT/ML IV SOLN
500.0000 [IU] | Freq: Once | INTRAVENOUS | Status: AC
  Administered 2020-06-27: 250 [IU] via INTRAVENOUS
  Filled 2020-06-27: qty 5

## 2020-06-27 MED ORDER — SODIUM CHLORIDE 0.9% FLUSH
10.0000 mL | Freq: Once | INTRAVENOUS | Status: AC
  Administered 2020-06-27: 10 mL via INTRAVENOUS
  Filled 2020-06-27: qty 10

## 2020-06-27 NOTE — Progress Notes (Signed)
Pt seen by [name], NP in infusion room. BP was high (see chart) and Lattie Haw was made aware. Per Lattie Haw pt not being treated today and recommended to go to ED. Pt wheeled to ED, other VS WNL at time of d/c, pt not in visible distress.

## 2020-06-27 NOTE — ED Notes (Signed)
Pt LWBS.  Sent from [facility] center d/t high BP and inability to get chemo.  Pt arrives upset, in wheelchair being pushed by staff at the CA center.  Pt states he did not take his BP medications this morning due to being too busy.  Education given regarding importance of taking BP meds regularly.  Pt does not want treatment here in the ED, nor does he want to be evaluated by the MD prior to leaving.  States "I'm going to go see when they can reschedule my chemo and then I'll go home (10 minutes away) and take my medication."  Pt also states he has no sxs today and just wants to get his chemo back on track for scheduled surgery in a few weeks.  Pt ambulates out of ED without difficulty.  MD notified of LWBS.  BP obtained at bedside is 205/150 automatically.  Notified pt of need to take a manual BP and patient stated he was just going to leave.

## 2020-06-27 NOTE — Progress Notes (Signed)
  [facility] OFFICE PROGRESS NOTE   Diagnosis: Rectal cancer  INTERVAL HISTORY:   Mr. Zeiss returns for follow-up.  He completed cycle 1 FOLFOX 06/14/2020.  He reports he tolerated the chemotherapy well.  No nausea/vomiting.  No mouth sores.  No diarrhea.  He noted cold sensitivity for 1 or 2 days. He has not taken his blood pressure medication in several days.  He denies headache, visual disturbance, shortness of breath, chest pain.  Objective:  Vital signs in last 24 hours:  Temperature 98.3, heart rate 80, respirations 17, blood pressure 190/138, 190/140   Resp: Lungs clear bilaterally. Cardio: Regular rate and rhythm. GI: Abdomen soft and nontender.  No hepatomegaly. Vascular: No leg edema. Skin: Palms and nailbeds with hyperpigmentation. Right upper extremity PICC without erythema.   Lab Results:  Lab Results  Component Value Date   WBC 8.5 06/27/2020   HGB 10.5 (L) 06/27/2020   HCT 32.9 (L) 06/27/2020   MCV 90.9 06/27/2020   PLT 168 06/27/2020   NEUTROABS 6.5 06/27/2020    Imaging:  No results found.  Medications: I have reviewed the patient's current medications.  Assessment/Plan: 1. Rectal cancer-rectal mass at 5-6 cm from the anal verge on colonoscopy 01/11/2020, biopsy confirmed adenocarcinoma ? CTs 01/11/2020-rectal thickening, small perirectal lymph nodes and external iliac nodes, moderate right pleural effusion ? MRI pelvis 01/27/2020-T3bN2b tumor at 7.1 cm from the internal anal sphincter, multiple mesorectal nodes, indistinct left pelvic sidewall node and enlarged bilateral external iliac nodes ? PET scan 02/11/2020-hypermetabolic rectosigmoid mass, hypermetabolic mesorectal and sigmoid mesocolon lymph nodes, left external iliac/obturator node with mild hypermetabolism, 18 mm left inguinal node with normal architecture and slight hypermetabolism ? Radiation and infusional 5-FU 02/21/2020-04/04/2020 ? CTs 05/09/2020-decreased perirectal and  sigmoid mesocolon lymphadenopathy, no evidence of disease progression ? MRI pelvis 05/17/2020-no well-defined residual rectal mass seen. Wall appears slightly irregular. No perirectal extension of tumor. Again identified is perirectal adenopathy, similar to most recent CT 05/10/2020 but improved compared to PET/CT of 02/11/2020. ? Cycle 1 FOLFOX 06/14/2020 ? 06/27/2020 chemotherapy held due to hypertension 2. Anemia 3. Chronic renal failure 4. Congestive heart failure-severe LVH 5. Diabetes 6. Gout 7. Peripheral neuropathy? 8. Hypertension   Disposition: Mr. Concannon appears stable.  He tolerated the first cycle of FOLFOX well.  Unfortunately his blood pressure is too high to proceed with chemotherapy today.  He admits noncompliance with his medications.  Dr. Benay Spice recommends evaluation in the emergency department.  We have contacted the emergency department at [facility].  He is in the process of being transported there.  Chemotherapy will be rescheduled.    [name] ANP/GNP-BC   06/27/2020  1:57 PM

## 2020-06-28 ENCOUNTER — Telehealth: Payer: Self-pay | Admitting: Nurse Practitioner

## 2020-06-28 NOTE — Telephone Encounter (Signed)
Scheduled appt per 3/30 sch msg. Called pt multiples times today. No answer each time and vm is full so unable to leave a msg. Called pt's mother who is aware of appts and stated she will let pt know.

## 2020-06-29 ENCOUNTER — Inpatient Hospital Stay: Payer: Medicaid Other

## 2020-06-29 ENCOUNTER — Other Ambulatory Visit: Payer: Self-pay

## 2020-06-29 VITALS — BP 147/98 | HR 81 | Temp 98.0°F | Resp 18

## 2020-06-29 DIAGNOSIS — Z5111 Encounter for antineoplastic chemotherapy: Secondary | ICD-10-CM | POA: Diagnosis not present

## 2020-06-29 DIAGNOSIS — C20 Malignant neoplasm of rectum: Secondary | ICD-10-CM

## 2020-06-29 MED ORDER — FLUOROURACIL CHEMO INJECTION 2.5 GM/50ML
300.0000 mg/m2 | Freq: Once | INTRAVENOUS | Status: AC
  Administered 2020-06-29: 700 mg via INTRAVENOUS
  Filled 2020-06-29: qty 14

## 2020-06-29 MED ORDER — OXALIPLATIN CHEMO INJECTION 100 MG/20ML
65.0000 mg/m2 | Freq: Once | INTRAVENOUS | Status: AC
  Administered 2020-06-29: 155 mg via INTRAVENOUS
  Filled 2020-06-29: qty 31

## 2020-06-29 MED ORDER — PALONOSETRON HCL INJECTION 0.25 MG/5ML
0.2500 mg | Freq: Once | INTRAVENOUS | Status: AC
  Administered 2020-06-29: 0.25 mg via INTRAVENOUS

## 2020-06-29 MED ORDER — DEXAMETHASONE SODIUM PHOSPHATE 100 MG/10ML IJ SOLN
10.0000 mg | Freq: Once | INTRAMUSCULAR | Status: AC
  Administered 2020-06-29: 10 mg via INTRAVENOUS
  Filled 2020-06-29: qty 10

## 2020-06-29 MED ORDER — HEPARIN SOD (PORK) LOCK FLUSH 100 UNIT/ML IV SOLN
250.0000 [IU] | Freq: Once | INTRAVENOUS | Status: DC | PRN
  Filled 2020-06-29: qty 5

## 2020-06-29 MED ORDER — DEXTROSE 5 % IV SOLN
Freq: Once | INTRAVENOUS | Status: AC
  Filled 2020-06-29: qty 250

## 2020-06-29 MED ORDER — PALONOSETRON HCL INJECTION 0.25 MG/5ML
INTRAVENOUS | Status: AC
  Filled 2020-06-29: qty 5

## 2020-06-29 MED ORDER — SODIUM CHLORIDE 0.9% FLUSH
3.0000 mL | INTRAVENOUS | Status: DC | PRN
  Filled 2020-06-29: qty 10

## 2020-06-29 MED ORDER — SODIUM CHLORIDE 0.9 % IV SOLN
2000.0000 mg/m2 | INTRAVENOUS | Status: DC
  Administered 2020-06-29: 4800 mg via INTRAVENOUS
  Filled 2020-06-29: qty 96

## 2020-06-29 MED ORDER — LEUCOVORIN CALCIUM INJECTION 350 MG
300.0000 mg/m2 | Freq: Once | INTRAVENOUS | Status: AC
  Administered 2020-06-29: 718 mg via INTRAVENOUS
  Filled 2020-06-29: qty 35.9

## 2020-06-29 NOTE — Progress Notes (Signed)
Per Dr. Benay Spice, okay to waste remaining chemo from pump on Saturday, 07/01/20.

## 2020-06-29 NOTE — Progress Notes (Signed)
Per [name], NP okay to treat with elevated SCr from 06/27/20.

## 2020-06-29 NOTE — Patient Instructions (Signed)
[facility] Discharge Instructions for Patients Receiving Chemotherapy  Today you received the following chemotherapy agents: Oxaliplatin/Leucovorin/5FU  To help prevent nausea and vomiting after your treatment, we encourage you to take your nausea medication as directed.   If you develop nausea and vomiting that is not controlled by your nausea medication, call the clinic.   BELOW ARE SYMPTOMS THAT SHOULD BE REPORTED IMMEDIATELY:  *FEVER GREATER THAN 100.5 F  *CHILLS WITH OR WITHOUT FEVER  NAUSEA AND VOMITING THAT IS NOT CONTROLLED WITH YOUR NAUSEA MEDICATION  *UNUSUAL SHORTNESS OF BREATH  *UNUSUAL BRUISING OR BLEEDING  TENDERNESS IN MOUTH AND THROAT WITH OR WITHOUT PRESENCE OF ULCERS  *URINARY PROBLEMS  *BOWEL PROBLEMS  UNUSUAL RASH Items with * indicate a potential emergency and should be followed up as soon as possible.  Feel free to call the clinic should you have any questions or concerns. The clinic phone number is [phone].  Please show the CHEMO ALERT CARD at check-in to the Emergency Department and triage nurse.   

## 2020-06-30 ENCOUNTER — Ambulatory Visit: Payer: Medicaid Other | Admitting: Oncology

## 2020-07-01 ENCOUNTER — Other Ambulatory Visit: Payer: Self-pay

## 2020-07-01 ENCOUNTER — Inpatient Hospital Stay: Payer: Medicaid Other | Attending: Oncology

## 2020-07-01 VITALS — BP 162/90 | HR 84 | Temp 97.5°F | Resp 18

## 2020-07-01 DIAGNOSIS — I129 Hypertensive chronic kidney disease with stage 1 through stage 4 chronic kidney disease, or unspecified chronic kidney disease: Secondary | ICD-10-CM | POA: Diagnosis not present

## 2020-07-01 DIAGNOSIS — I10 Essential (primary) hypertension: Secondary | ICD-10-CM | POA: Diagnosis not present

## 2020-07-01 DIAGNOSIS — N189 Chronic kidney disease, unspecified: Secondary | ICD-10-CM | POA: Insufficient documentation

## 2020-07-01 DIAGNOSIS — D649 Anemia, unspecified: Secondary | ICD-10-CM | POA: Insufficient documentation

## 2020-07-01 DIAGNOSIS — Z5111 Encounter for antineoplastic chemotherapy: Secondary | ICD-10-CM | POA: Insufficient documentation

## 2020-07-01 DIAGNOSIS — E1122 Type 2 diabetes mellitus with diabetic chronic kidney disease: Secondary | ICD-10-CM | POA: Diagnosis not present

## 2020-07-01 DIAGNOSIS — Z452 Encounter for adjustment and management of vascular access device: Secondary | ICD-10-CM | POA: Insufficient documentation

## 2020-07-01 DIAGNOSIS — C20 Malignant neoplasm of rectum: Secondary | ICD-10-CM | POA: Insufficient documentation

## 2020-07-01 MED ORDER — HEPARIN SOD (PORK) LOCK FLUSH 100 UNIT/ML IV SOLN
250.0000 [IU] | Freq: Once | INTRAVENOUS | Status: AC | PRN
  Administered 2020-07-01: 250 [IU]
  Filled 2020-07-01: qty 5

## 2020-07-01 MED ORDER — SODIUM CHLORIDE 0.9% FLUSH
10.0000 mL | INTRAVENOUS | Status: DC | PRN
  Administered 2020-07-01: 10 mL
  Filled 2020-07-01: qty 10

## 2020-07-01 NOTE — Progress Notes (Signed)
Pt here for pump d/c, pump disconnected, BP elevated 167/109. Pt stated he took b/p medication 10 am this morning. BP retake after 30 minutes 151/104. Repositioned Pt with feet elevated,  Manual BP  taken 15 minutes after 162/90 Pt encouraged to continue taking bp medication. Pt verbalized understanding.

## 2020-07-03 ENCOUNTER — Other Ambulatory Visit: Payer: Self-pay

## 2020-07-08 ENCOUNTER — Other Ambulatory Visit: Payer: Self-pay | Admitting: Oncology

## 2020-07-10 ENCOUNTER — Telehealth: Payer: Self-pay | Admitting: Oncology

## 2020-07-10 ENCOUNTER — Other Ambulatory Visit: Payer: Self-pay | Admitting: *Deleted

## 2020-07-10 DIAGNOSIS — C20 Malignant neoplasm of rectum: Secondary | ICD-10-CM

## 2020-07-10 NOTE — Telephone Encounter (Signed)
Scheduled appt per 4/11 sch msg - pt is aware of appt on 4/14

## 2020-07-13 ENCOUNTER — Inpatient Hospital Stay: Payer: Medicaid Other

## 2020-07-13 ENCOUNTER — Encounter: Payer: Self-pay | Admitting: Nurse Practitioner

## 2020-07-13 ENCOUNTER — Inpatient Hospital Stay (HOSPITAL_BASED_OUTPATIENT_CLINIC_OR_DEPARTMENT_OTHER): Payer: Medicaid Other | Admitting: Nurse Practitioner

## 2020-07-13 ENCOUNTER — Telehealth: Payer: Self-pay | Admitting: Nurse Practitioner

## 2020-07-13 ENCOUNTER — Other Ambulatory Visit: Payer: Self-pay

## 2020-07-13 VITALS — BP 133/88 | HR 76 | Temp 97.7°F | Resp 18 | Ht 73.0 in | Wt 242.0 lb

## 2020-07-13 VITALS — BP 149/94 | HR 78 | Resp 18

## 2020-07-13 DIAGNOSIS — Z5111 Encounter for antineoplastic chemotherapy: Secondary | ICD-10-CM | POA: Diagnosis not present

## 2020-07-13 DIAGNOSIS — C20 Malignant neoplasm of rectum: Secondary | ICD-10-CM

## 2020-07-13 LAB — CMP (CANCER CENTER ONLY)
ALT: 14 U/L (ref 0–44)
AST: 16 U/L (ref 15–41)
Albumin: 3.6 g/dL (ref 3.5–5.0)
Alkaline Phosphatase: 127 U/L — ABNORMAL HIGH (ref 38–126)
Anion gap: 10 (ref 5–15)
BUN: 39 mg/dL — ABNORMAL HIGH (ref 6–20)
CO2: 24 mmol/L (ref 22–32)
Calcium: 7.9 mg/dL — ABNORMAL LOW (ref 8.9–10.3)
Chloride: 101 mmol/L (ref 98–111)
Creatinine: 3.26 mg/dL (ref 0.61–1.24)
GFR, Estimated: 21 mL/min — ABNORMAL LOW (ref >60)
Glucose, Bld: 179 mg/dL — ABNORMAL HIGH (ref 70–99)
Potassium: 3.7 mmol/L (ref 3.5–5.1)
Sodium: 135 mmol/L (ref 135–145)
Total Bilirubin: 0.3 mg/dL (ref 0.3–1.2)
Total Protein: 6.9 g/dL (ref 6.5–8.1)

## 2020-07-13 LAB — CBC WITH DIFFERENTIAL (CANCER CENTER ONLY)
Abs Immature Granulocytes: 0.03 10*3/uL (ref 0.00–0.07)
Basophils Absolute: 0 10*3/uL (ref 0.0–0.1)
Basophils Relative: 1 %
Eosinophils Absolute: 0.2 10*3/uL (ref 0.0–0.5)
Eosinophils Relative: 3 %
HCT: 33.5 % — ABNORMAL LOW (ref 39.0–52.0)
Hemoglobin: 10.5 g/dL — ABNORMAL LOW (ref 13.0–17.0)
Immature Granulocytes: 0 %
Lymphocytes Relative: 10 %
Lymphs Abs: 0.7 10*3/uL (ref 0.7–4.0)
MCH: 29 pg (ref 26.0–34.0)
MCHC: 31.3 g/dL (ref 30.0–36.0)
MCV: 92.5 fL (ref 80.0–100.0)
Monocytes Absolute: 0.8 10*3/uL (ref 0.1–1.0)
Monocytes Relative: 11 %
Neutro Abs: 5.3 10*3/uL (ref 1.7–7.7)
Neutrophils Relative %: 75 %
Platelet Count: 168 10*3/uL (ref 150–400)
RBC: 3.62 MIL/uL — ABNORMAL LOW (ref 4.22–5.81)
RDW: 15.7 % — ABNORMAL HIGH (ref 11.5–15.5)
WBC Count: 7.1 10*3/uL (ref 4.0–10.5)
nRBC: 0 % (ref 0.0–0.2)

## 2020-07-13 MED ORDER — SODIUM CHLORIDE 0.9 % IV SOLN
10.0000 mg | Freq: Once | INTRAVENOUS | Status: AC
  Administered 2020-07-13: 10 mg via INTRAVENOUS
  Filled 2020-07-13: qty 1

## 2020-07-13 MED ORDER — SODIUM CHLORIDE 0.9 % IV SOLN
2000.0000 mg/m2 | INTRAVENOUS | Status: DC
  Administered 2020-07-13: 4800 mg via INTRAVENOUS
  Filled 2020-07-13: qty 96

## 2020-07-13 MED ORDER — FLUOROURACIL CHEMO INJECTION 2.5 GM/50ML
300.0000 mg/m2 | Freq: Once | INTRAVENOUS | Status: AC
  Administered 2020-07-13: 700 mg via INTRAVENOUS
  Filled 2020-07-13: qty 14

## 2020-07-13 MED ORDER — DEXTROSE 5 % IV SOLN
Freq: Once | INTRAVENOUS | Status: AC
  Filled 2020-07-13: qty 250

## 2020-07-13 MED ORDER — LEUCOVORIN CALCIUM INJECTION 350 MG
300.0000 mg/m2 | Freq: Once | INTRAVENOUS | Status: AC
  Administered 2020-07-13: 718 mg via INTRAVENOUS
  Filled 2020-07-13: qty 35.9

## 2020-07-13 MED ORDER — PALONOSETRON HCL INJECTION 0.25 MG/5ML
0.2500 mg | Freq: Once | INTRAVENOUS | Status: AC
  Administered 2020-07-13: 0.25 mg via INTRAVENOUS
  Filled 2020-07-13: qty 5

## 2020-07-13 MED ORDER — OXALIPLATIN CHEMO INJECTION 100 MG/20ML
65.0000 mg/m2 | Freq: Once | INTRAVENOUS | Status: AC
  Administered 2020-07-13: 155 mg via INTRAVENOUS
  Filled 2020-07-13: qty 31

## 2020-07-13 NOTE — Progress Notes (Signed)
Craig West presents today for D1C3 FOLFOX. Pt denies any new changes or symptoms since last treatment. Lab results and vitals have been reviewed, inclluding Cr 3.26, and are stable and within parameters for treatment.  Patient has been assessed by [name], NP who has approved proceeding with treatment today as planned.  Infusions tolerated without incident or complaint. VSS upon completion of treatment. PICC flushed per protocol, 5FU infusing via home infusion pump with RUN noted on screen, see MAR and IV flowsheet for details. Discharged in satisfactory condition with follow up instructions.

## 2020-07-13 NOTE — Telephone Encounter (Signed)
Scheduled appt per 4/14 los - gave patient appts in infusion

## 2020-07-13 NOTE — Progress Notes (Signed)
  [facility] OFFICE PROGRESS NOTE   Diagnosis: Rectal cancer  INTERVAL HISTORY:   Craig West returns as scheduled.  Cycle 2 FOLFOX was held on 06/27/2020 due to hypertension.  He was referred to the emergency department.  Once in the emergency department he declined evaluation.  He returned for cycle 2 FOLFOX on 06/29/2020 at which time blood pressure was improved.  He denies nausea/vomiting.  No mouth sores.  No diarrhea.  Cold sensitivity lasted 3 to 4 days.  No persistent neuropathy symptoms.  Bowels moving regularly.  No blood or pain with bowel movements.  Objective:  Vital signs in last 24 hours:  Blood pressure 133/88, pulse 76, temperature 97.7 F (36.5 C), temperature source Tympanic, resp. rate 18, height 6\' 1"  (1.854 m), weight 242 lb (109.8 kg), SpO2 97 %.    HEENT: No thrush or ulcers. Resp: Lungs clear bilaterally. Cardio: Regular rate and rhythm. GI: Abdomen soft and nontender.  No hepatomegaly. Vascular: No leg edema.  Skin: Palms dry appearing, no erythema. Right upper extremity PICC without erythema.   Lab Results:  Lab Results  Component Value Date   WBC 7.1 07/13/2020   HGB 10.5 (L) 07/13/2020   HCT 33.5 (L) 07/13/2020   MCV 92.5 07/13/2020   PLT 168 07/13/2020   NEUTROABS 5.3 07/13/2020    Imaging:  No results found.  Medications: I have reviewed the patient's current medications.  Assessment/Plan: 1. Rectal cancer-rectal mass at 5-6 cm from the anal verge on colonoscopy 01/11/2020, biopsy confirmed adenocarcinoma ? CTs 01/11/2020-rectal thickening, small perirectal lymph nodes and external iliac nodes, moderate right pleural effusion ? MRI pelvis 01/27/2020-T3bN2b tumor at 7.1 cm from the internal anal sphincter, multiple mesorectal nodes, indistinct left pelvic sidewall node and enlarged bilateral external iliac nodes ? PET scan 02/11/2020-hypermetabolic rectosigmoid mass, hypermetabolic mesorectal and sigmoid mesocolon lymph  nodes, left external iliac/obturator node with mild hypermetabolism, 18 mm left inguinal node with normal architecture and slight hypermetabolism ? Radiation and infusional 5-FU 02/21/2020-04/04/2020 ? CTs 05/09/2020-decreased perirectal and sigmoid mesocolon lymphadenopathy, no evidence of disease progression ? MRI pelvis 05/17/2020-no well-defined residual rectal mass seen. Wall appears slightly irregular. No perirectal extension of tumor. Again identified is perirectal adenopathy, similar to most recent CT 05/10/2020 but improved compared to PET/CT of 02/11/2020. ? Cycle 1 FOLFOX 06/14/2020 ? 06/27/2020 chemotherapy held due to hypertension ? Cycle 2 FOLFOX 06/29/2020 ? Cycle 3 FOLFOX 07/13/2020 2. Anemia 3. Chronic renal failure 4. Congestive heart failure-severe LVH 5. Diabetes 6. Gout 7. Peripheral neuropathy? 8. Hypertension   Disposition: Craig West appears stable.  He has completed 2 cycles of FOLFOX.  Plan to proceed with cycle 3 today as scheduled.  We reviewed the CBC from today.  Counts adequate to proceed with treatment.  He will return for PICC care on a Tuesday Friday schedule the week off of chemotherapy.  He will return for lab, follow-up, cycle 4 FOLFOX in 2 weeks.  He will contact the office in the interim with any problems.    [name] ANP/GNP-BC   07/13/2020  9:13 AM

## 2020-07-13 NOTE — Patient Instructions (Signed)
[facility] Discharge Instructions for Patients Receiving Chemotherapy  Today you received the following chemotherapy agents FOLFOX  To help prevent nausea and vomiting after your treatment, we encourage you to take your nausea medication   If you develop nausea and vomiting that is not controlled by your nausea medication, call the clinic.   BELOW ARE SYMPTOMS THAT SHOULD BE REPORTED IMMEDIATELY:  *FEVER GREATER THAN 100.5 F  *CHILLS WITH OR WITHOUT FEVER  NAUSEA AND VOMITING THAT IS NOT CONTROLLED WITH YOUR NAUSEA MEDICATION  *UNUSUAL SHORTNESS OF BREATH  *UNUSUAL BRUISING OR BLEEDING  TENDERNESS IN MOUTH AND THROAT WITH OR WITHOUT PRESENCE OF ULCERS  *URINARY PROBLEMS  *BOWEL PROBLEMS  UNUSUAL RASH Items with * indicate a potential emergency and should be followed up as soon as possible.  Feel free to call the clinic should you have any questions or concerns at The clinic phone number is [phone].  Please show the [facility] at check-in to the Emergency Department and triage nurse.

## 2020-07-13 NOTE — Progress Notes (Signed)
Per Dr. Benay Spice: OK to treat w/creatinine 3.26

## 2020-07-14 ENCOUNTER — Ambulatory Visit: Payer: Medicaid Other | Admitting: Oncology

## 2020-07-15 ENCOUNTER — Other Ambulatory Visit: Payer: Self-pay

## 2020-07-15 ENCOUNTER — Inpatient Hospital Stay: Payer: Medicaid Other

## 2020-07-15 VITALS — BP 148/104 | HR 75 | Temp 97.2°F | Resp 20

## 2020-07-15 DIAGNOSIS — C20 Malignant neoplasm of rectum: Secondary | ICD-10-CM

## 2020-07-15 DIAGNOSIS — Z5111 Encounter for antineoplastic chemotherapy: Secondary | ICD-10-CM | POA: Diagnosis not present

## 2020-07-15 MED ORDER — SODIUM CHLORIDE 0.9% FLUSH
10.0000 mL | INTRAVENOUS | Status: DC | PRN
  Administered 2020-07-15: 10 mL
  Filled 2020-07-15: qty 10

## 2020-07-15 MED ORDER — HEPARIN SOD (PORK) LOCK FLUSH 100 UNIT/ML IV SOLN
500.0000 [IU] | Freq: Once | INTRAVENOUS | Status: AC | PRN
  Administered 2020-07-15: 500 [IU]
  Filled 2020-07-15: qty 5

## 2020-07-18 ENCOUNTER — Other Ambulatory Visit: Payer: Self-pay

## 2020-07-18 ENCOUNTER — Inpatient Hospital Stay: Payer: Medicaid Other

## 2020-07-18 DIAGNOSIS — Z452 Encounter for adjustment and management of vascular access device: Secondary | ICD-10-CM

## 2020-07-18 DIAGNOSIS — C20 Malignant neoplasm of rectum: Secondary | ICD-10-CM

## 2020-07-18 DIAGNOSIS — Z5111 Encounter for antineoplastic chemotherapy: Secondary | ICD-10-CM | POA: Diagnosis not present

## 2020-07-18 MED ORDER — HEPARIN SOD (PORK) LOCK FLUSH 100 UNIT/ML IV SOLN
250.0000 [IU] | Freq: Once | INTRAVENOUS | Status: AC
  Administered 2020-07-18: 250 [IU]
  Filled 2020-07-18: qty 5

## 2020-07-18 MED ORDER — SODIUM CHLORIDE 0.9% FLUSH
10.0000 mL | Freq: Once | INTRAVENOUS | Status: AC
  Administered 2020-07-18: 10 mL
  Filled 2020-07-18: qty 10

## 2020-07-18 MED FILL — Albuterol Sulfate Inhal Aero 108 MCG/ACT (90MCG Base Equiv): RESPIRATORY_TRACT | 25 days supply | Qty: 18 | Fill #0 | Status: AC

## 2020-07-18 MED FILL — Amlodipine Besylate Tab 5 MG (Base Equivalent): ORAL | 30 days supply | Qty: 60 | Fill #0 | Status: AC

## 2020-07-20 ENCOUNTER — Other Ambulatory Visit: Payer: Self-pay

## 2020-07-20 MED FILL — Gabapentin Cap 300 MG: ORAL | 90 days supply | Qty: 180 | Fill #0 | Status: AC

## 2020-07-20 MED FILL — Hydralazine HCl Tab 50 MG: ORAL | 90 days supply | Qty: 90 | Fill #0 | Status: AC

## 2020-07-20 MED FILL — Isosorbide Mononitrate Tab ER 24HR 60 MG: ORAL | 90 days supply | Qty: 90 | Fill #0 | Status: AC

## 2020-07-20 MED FILL — Atorvastatin Calcium Tab 40 MG (Base Equivalent): ORAL | 90 days supply | Qty: 90 | Fill #0 | Status: AC

## 2020-07-20 MED FILL — Tamsulosin HCl Cap 0.4 MG: ORAL | 90 days supply | Qty: 90 | Fill #0 | Status: AC

## 2020-07-20 MED FILL — Furosemide Tab 20 MG: ORAL | 90 days supply | Qty: 540 | Fill #0 | Status: AC

## 2020-07-20 MED FILL — Carvedilol Tab 12.5 MG: ORAL | 90 days supply | Qty: 180 | Fill #0 | Status: AC

## 2020-07-20 MED FILL — Glipizide Tab 5 MG: ORAL | 90 days supply | Qty: 45 | Fill #0 | Status: AC

## 2020-07-21 ENCOUNTER — Other Ambulatory Visit: Payer: Self-pay

## 2020-07-21 ENCOUNTER — Inpatient Hospital Stay: Payer: Medicaid Other

## 2020-07-21 VITALS — BP 191/122 | HR 80 | Temp 97.7°F | Resp 20

## 2020-07-21 DIAGNOSIS — Z5111 Encounter for antineoplastic chemotherapy: Secondary | ICD-10-CM | POA: Diagnosis not present

## 2020-07-21 DIAGNOSIS — C20 Malignant neoplasm of rectum: Secondary | ICD-10-CM

## 2020-07-21 DIAGNOSIS — Z452 Encounter for adjustment and management of vascular access device: Secondary | ICD-10-CM

## 2020-07-21 MED ORDER — HEPARIN SOD (PORK) LOCK FLUSH 100 UNIT/ML IV SOLN
250.0000 [IU] | Freq: Once | INTRAVENOUS | Status: AC
  Administered 2020-07-21: 250 [IU]
  Filled 2020-07-21: qty 5

## 2020-07-21 MED ORDER — SODIUM CHLORIDE 0.9% FLUSH
10.0000 mL | Freq: Once | INTRAVENOUS | Status: AC
  Administered 2020-07-21: 10 mL
  Filled 2020-07-21: qty 10

## 2020-07-21 NOTE — Progress Notes (Signed)
Patient's BP is high today. Patient said that he knew it would be because he hadn't taken his BP meds this morning. Patient stated that he would take them when he got home.   Craig West presented for PICC double lumen flush today.  See IV assessment in docflowsheets for PICC details.  PICC located right arm.  Good blood return present x 2.  PICC flushed with 51ml NS and 250U Heparin, see MAR for further details.  Nicko Man tolerated procedure well and without incident.

## 2020-07-22 ENCOUNTER — Other Ambulatory Visit: Payer: Self-pay | Admitting: Oncology

## 2020-07-25 ENCOUNTER — Other Ambulatory Visit: Payer: Self-pay

## 2020-07-25 ENCOUNTER — Inpatient Hospital Stay: Payer: Medicaid Other

## 2020-07-25 VITALS — BP 101/77 | HR 78 | Temp 98.6°F | Resp 20

## 2020-07-25 DIAGNOSIS — C20 Malignant neoplasm of rectum: Secondary | ICD-10-CM

## 2020-07-25 DIAGNOSIS — Z5111 Encounter for antineoplastic chemotherapy: Secondary | ICD-10-CM | POA: Diagnosis not present

## 2020-07-25 DIAGNOSIS — Z452 Encounter for adjustment and management of vascular access device: Secondary | ICD-10-CM

## 2020-07-25 MED ORDER — HEPARIN SOD (PORK) LOCK FLUSH 100 UNIT/ML IV SOLN
250.0000 [IU] | Freq: Once | INTRAVENOUS | Status: AC
  Administered 2020-07-25: 250 [IU]
  Filled 2020-07-25: qty 5

## 2020-07-25 MED ORDER — SODIUM CHLORIDE 0.9% FLUSH
10.0000 mL | Freq: Once | INTRAVENOUS | Status: AC
Start: 2020-07-25 — End: 2020-07-25
  Administered 2020-07-25: 10 mL
  Filled 2020-07-25: qty 10

## 2020-07-25 NOTE — Progress Notes (Signed)
Pt stated to [facility] that he felt dizzy- he asked for BP and CS taken.  All WNL.  Pt declined any care by MD.  Was unable to review meds, stated he took meds this am.Instructed pt to Call MD if worse, verbalized understanding.

## 2020-07-26 NOTE — Progress Notes (Signed)
Cardiology Clinic Note   Patient Name: Craig West Date of Encounter: 08/01/2020  Primary Care Provider:  Charlott Rakes, MD Primary Cardiologist:  Skeet Latch, MD  Patient Profile    Craig West 55 year old male presents the clinic today for follow-up evaluation of his hypertension.  Past Medical History    Past Medical History:  Diagnosis Date  . Asthma   . CHF (congestive heart failure) ([place])   . CKD (chronic kidney disease), stage IV ([place])   . COVID-19 virus infection 04/2019  . Diabetes mellitus without complication ([place])   . Gout   . Hypertension   . Rectal cancer [facility])    Past Surgical History:  Procedure Laterality Date  . BIOPSY  01/11/2020   Procedure: BIOPSY;  Surgeon: Irene Shipper, MD;  Location: [facility] ENDOSCOPY;  Service: Endoscopy;;  . COLONOSCOPY WITH PROPOFOL N/A 01/11/2020   Procedure: COLONOSCOPY WITH PROPOFOL;  Surgeon: Irene Shipper, MD;  Location: [facility];  Service: Endoscopy;  Laterality: N/A;  . NO PAST SURGERIES    . POLYPECTOMY  01/11/2020   Procedure: POLYPECTOMY;  Surgeon: Irene Shipper, MD;  Location: [facility] ENDOSCOPY;  Service: Endoscopy;;  . SUBMUCOSAL TATTOO INJECTION  01/11/2020   Procedure: SUBMUCOSAL TATTOO INJECTION;  Surgeon: Irene Shipper, MD;  Location: [facility] ENDOSCOPY;  Service: Endoscopy;;    Allergies  No Known Allergies  History of Present Illness    Mr Kissick has a PMH of HTN, DM, CKD stage IV, rectal carcinoma, and asthma.  He was initially seen to establish care with the advanced hypertension clinic.  During that time he reported that he had been having high blood pressure for the last 2 years at least.  He reported he struggled to afford his medication due to lack of insurance and income.  He was admitted to the hospital 9/21 for left hypertensive emergency.  He presented to the hospital with subacute onset of shortness of breath and edema.  He initially thought that his edema may be related to his gout.  While in the  emergency department his blood pressure was recorded at 263/163.  Patient is given labetalol and IV hydralazine.  His home amlodipine, hydrochlorothiazide, and lisinopril were present.  An echocardiogram at that time showed an LVEF of 60-65% with severe LVH.  Cardiology was consulted for possible infiltrative cardiomyopathy.  He denied symptoms of palpitations and family history of sudden cardiac death.  He was noted to have short episodes of NSVT.  His high-sensitivity troponins at the time were elevated at 219.  Due to his CKD stage IV his lisinopril and HCTZ were discontinued.  He was given IV diuresis and transition to p.o. furosemide at discharge.  He was started on carvedilol and hydralazine at that time.  He presented to his PCP on 01/03/2020 and was noted to be hypertensive 84/60.  His amlodipine was reduced to 5 mg.  His weight had increased to 260 pounds.  His lab work showed his creatinine had increased to 4.79.  His potassium was 5.9.  Around that time he was also diagnosed with rectal carcinoma and completed chemotherapy and XRT.  He was seen by [facility] surgery.  He was seen by Dr. Oval Linsey on 06/19/2020.  During that time he reported he had forgotten to take his medications.  He reported that he would occasionally forget them at work.  He did not have a blood pressure machine.  He reported that over the last 2 weeks he has been noticing more shortness of  breath.  He denied lower extremity swelling, orthopnea, and PND.  He denied chest pain and pressure.  The cardiology clinic/social work made a plan to assist him in obtaining a blood pressure machine and he was instructed to not leave his medications at work.  It was felt that his acute on chronic diastolic heart failure was related to his poorly controlled hypertension.  He presents the clinic today for follow-up evaluation states he feels well.  He reports that he has received his blood pressure machine and his blood pressures are well  controlled.  They have been in the 115/70-80's.  He reports that he continues to occasionally forget his blood pressure medication.  He reports that he took it about 30 minutes before his visit today.  On initial check his blood pressure is 158/110.  He reports that [facility].  He reports that he finds it easy to get into and out of the facility.  We reviewed his previous echocardiogram and the significance of good blood pressure control to prevent CVA, MI, and cardiac hypertrophy.  He expressed understanding.  I will give him a blood pressure log, salty 6 diet sheet, and have him follow-up in 3 months.  Today he denies chest pain, shortness of breath, lower extremity edema, fatigue, palpitations, melena, hematuria, hemoptysis, diaphoresis, weakness, presyncope, syncope, orthopnea, and PND.   Home Medications    Prior to Admission medications   Medication Sig Start Date End Date Taking? Authorizing Provider  albuterol (VENTOLIN HFA) 108 (90 Base) MCG/ACT inhaler INHALE 2 PUFFS INTO THE LUNGS EVERY SIX HOURS AS NEEDED FOR WHEEZING OR SHORTNESS OF BREATH. 12/27/19 12/26/20  Andrew Au, MD  allopurinol (ZYLOPRIM) 100 MG tablet Take 200 mg by mouth daily.    [provider]  amLODipine (NORVASC) 5 MG tablet TAKE 2 TABLETS (10 MG TOTAL) BY MOUTH DAILY. 05/25/20 05/25/21  Charlott Rakes, MD  atorvastatin (LIPITOR) 40 MG tablet TAKE 1 TABLET (40 MG TOTAL) BY MOUTH DAILY. FOR HYPERCHOLESTEROLEMIA Patient taking differently: Take 40 mg by mouth daily. 05/25/20 05/25/21  Charlott Rakes, MD  Blood Glucose Monitoring Suppl (TRUE METRIX METER) w/Device KIT Check blood sugar fasting and ant bedtime and record 05/15/17   Charlott Rakes, MD  carvedilol (COREG) 12.5 MG tablet TAKE 1 TABLET (12.5 MG TOTAL) BY MOUTH 2 (TWO) TIMES DAILY WITH A MEAL. 05/25/20 05/25/21  Charlott Rakes, MD  carvedilol (COREG) 6.25 MG tablet TAKE 2 TABLETS (12.5 MG  TOTAL) BY MOUTH TWO TIMES DAILY WITH A MEAL. 12/27/19 12/26/20  Andrew Au, MD  colchicine 0.6 MG tablet Take 2 tablets (1.2 mg) orally at the onset of a gout attack; may repeat 1 tablet (0.6 mg) in 2 hours if symptoms persist Patient taking differently: Take 0.6-1.2 mg by mouth See admin instructions. Take 2 mg orally at the onset of a gout attack; may repeat 0.6 mg in 2 hours if symptoms persist 12/27/19   Andrew Au, MD  furosemide (LASIX) 20 MG tablet TAKE 3 TABLETS (60 MG TOTAL) BY MOUTH 2 (TWO) TIMES DAILY. 05/25/20 05/25/21  Charlott Rakes, MD  furosemide (LASIX) 80 MG tablet TAKE 1/2 TABLET (40 MG TOTAL) BY MOUTH DAILY. 12/27/19 12/26/20  Andrew Au, MD  gabapentin (NEURONTIN) 300 MG capsule TAKE 1 CAPSULE (300 MG TOTAL) BY MOUTH 2 (TWO) TIMES DAILY. 04/05/20 04/05/21  Argentina Donovan, PA-C  glipiZIDE (GLUCOTROL) 5 MG tablet TAKE 0.5 TABLETS (2.5 MG TOTAL) BY  MOUTH DAILY BEFORE BREAKFAST. Patient taking differently: Take 5 mg by mouth daily before breakfast. 05/25/20 05/25/21  Charlott Rakes, MD  glucose blood (TRUE METRIX BLOOD GLUCOSE TEST) test strip Use as instructed 12/27/19   Andrew Au, MD  hydrALAZINE (APRESOLINE) 50 MG tablet TAKE 1 TABLET (50 MG TOTAL) BY MOUTH 3 (THREE) TIMES DAILY. 05/25/20 05/25/21  Charlott Rakes, MD  isosorbide mononitrate (IMDUR) 60 MG 24 hr tablet TAKE 1 TABLET (60 MG TOTAL) BY MOUTH DAILY. 05/25/20 05/25/21  Charlott Rakes, MD  Misc. Devices (DIGITAL GLASS SCALE) MISC Use scale to weight yourself. Increasing weight may indicate fluid overload 12/27/19   Andrew Au, MD  tamsulosin (FLOMAX) 0.4 MG CAPS capsule TAKE 1 CAPSULE (0.4 MG TOTAL) BY MOUTH DAILY. 05/25/20 05/25/21  Charlott Rakes, MD  TRUEplus Lancets 28G MISC Check blood sugar fasting and at bedtime 12/27/19   Andrew Au, MD    Family History    Family History  Problem Relation Age of Onset  . Heart failure Brother    He indicated that the status of his brother is unknown.  Social  History    Social History   Socioeconomic History  . Marital status: Single    Spouse name: Not on file  . Number of children: Not on file  . Years of education: Not on file  . Highest education level: Not on file  Occupational History  . Not on file  Tobacco Use  . Smoking status: Former Smoker    Packs/day: 0.50    Types: Cigarettes    Quit date: 10/27/2019    Years since quitting: 0.7  . Smokeless tobacco: Never Used  Vaping Use  . Vaping Use: Never used  Substance and Sexual Activity  . Alcohol use: Yes    Alcohol/week: 0.0 standard drinks    Comment: occ  . Drug use: No  . Sexual activity: Not on file  Other Topics Concern  . Not on file  Social History Narrative  . Not on file   Social Determinants of Health   Financial Resource Strain: Medium Risk  . Difficulty of Paying Living Expenses: Somewhat hard  Food Insecurity: No Food Insecurity  . Worried About Charity fundraiser in the Last Year: Never true  . Ran Out of Food in the Last Year: Never true  Transportation Needs: No Transportation Needs  . Lack of Transportation (Medical): No  . Lack of Transportation (Non-Medical): No  Physical Activity: Unknown  . Days of Exercise per Week: 0 days  . Minutes of Exercise per Session: Not on file  Stress: Stress Concern Present  . Feeling of Stress : To some extent  Social Connections: Not on file  Intimate Partner Violence: Not on file     Review of Systems    General:  No chills, fever, night sweats or weight changes.  Cardiovascular:  No chest pain, dyspnea on exertion, edema, orthopnea, palpitations, paroxysmal nocturnal dyspnea. Dermatological: No rash, lesions/masses Respiratory: No cough, dyspnea Urologic: No hematuria, dysuria Abdominal:   No nausea, vomiting, diarrhea, bright red blood per rectum, melena, or hematemesis Neurologic:  No visual changes, wkns, changes in mental status. All other systems reviewed and are otherwise negative except as  noted above.  Physical Exam    VS:  BP 128/86   Pulse 68   Ht _0  (1.854 m)   Wt 236 lb 9.6 oz (107.3 kg)   SpO2 99%   BMI 31.22 kg/m  , BMI Body mass index  is 31.22 kg/m. GEN: Well nourished, well developed, in no acute distress. HEENT: normal. Neck: Supple, no JVD, carotid bruits, or masses. Cardiac: RRR, no murmurs, rubs, or gallops. No clubbing, cyanosis, edema.  Radials/DP/PT 2+ and equal bilaterally.  Respiratory:  Respirations regular and unlabored, clear to auscultation bilaterally. GI: Soft, nontender, nondistended, BS + x 4. MS: no deformity or atrophy. Skin: warm and dry, no rash. Neuro:  Strength and sensation are intact. Psych: Normal affect.  Accessory Clinical Findings    Recent Labs: 01/03/2020: TSH 2.170 01/06/2020: Magnesium 2.5 05/21/2020: B Natriuretic Peptide 791.0 07/27/2020: ALT 11; BUN 38; Creatinine 3.23; Hemoglobin 11.2; Platelet Count 139; Potassium 3.8; Sodium 139   Recent Lipid Panel    Component Value Date/Time   CHOL 167 04/27/2019 1020   TRIG 226 (H) 04/27/2019 1020   HDL 30 (L) 04/27/2019 1020   CHOLHDL 5.6 (H) 04/27/2019 1020   CHOLHDL 5.2 (H) 10/23/2015 1009   VLDL 46 (H) 10/23/2015 1009   LDLCALC 98 04/27/2019 1020    ECG personally reviewed by me today-none today.  Echocardiogram 12/25/2019 IMPRESSIONS    1. Consider infiltrative cardiomyopathy such as amyloidosis or global  variant hypertrophic cardiomyopathy - LV wall thickness 1.8-1.9 cm. Left  ventricular ejection fraction, by estimation, is 60 to 65%. The left  ventricle has normal function. The left  ventricle has no regional wall motion abnormalities. There is severe  concentric left ventricular hypertrophy. Left ventricular diastolic  parameters are consistent with Grade II diastolic dysfunction  (pseudonormalization). Elevated left ventricular  end-diastolic pressure.  2. Right ventricular systolic function is normal. The right ventricular  size is normal.  Tricuspid regurgitation signal is inadequate for assessing  PA pressure.  3. Left atrial size was mildly dilated.  4. The mitral valve is abnormal. Mild mitral valve regurgitation.  5. The aortic valve is tricuspid. Aortic valve regurgitation is not  visualized. Mild aortic valve sclerosis is present, with no evidence of  aortic valve stenosis.  6. The inferior vena cava is normal in size with greater than 50%  respiratory variability, suggesting right atrial pressure of 3 mmHg.   Comparison(s): No prior Echocardiogram.  Assessment & Plan   1.  Essential hypertension-BP today 128/86.  Brings in blood pressure log which shows good control at home. Continue amlodipine, carvedilol, furosemide, isosorbide Heart healthy low-sodium diet-salty 6 given Increase physical activity as tolerated Maintain blood pressure log and bring to next appointment  Acute on chronic diastolic CHF/LVH- no increased DOE or activity intolerance.  Echocardiogram showed LV thickness 1.8-1.9 cm, LVEF 60 to 65%, severe concentric left ventricular hypertrophy, and G2 DD.  Not a candidate for cardiac MRI due to CKD. Continue carvedilol, furosemide Heart healthy low-sodium diet-salty 6 given Increase physical activity as tolerated  NSVT- heart rate today 68.  Denies recent episodes of accelerated heartbeat or irregular heartbeats. Continue carvedilol Avoid triggers caffeine, chocolate, EtOH, dehydration etc. Increase physical activity as tolerated  CKD stage IV- creatinine 3.23 with GFR of 22 on 07/27/2020. Follows with PCP   Disposition: Follow-up with Dr. Oval Linsey in 3 months.    Jossie Ng. Samaria Anes NP-C    08/01/2020, 8:33 AM [address] Office [phone] Fax [phone]  Notice: This dictation was prepared with Dragon dictation along with smaller phrase technology. Any transcriptional errors that result from this process are unintentional and may  not be corrected upon review.  I spent 12 minutes examining this patient, reviewing medications, and using patient centered  shared decision making involving her cardiac care.  Prior to her visit I spent greater than 20 minutes reviewing her past medical history,  medications, and prior cardiac tests.

## 2020-07-27 ENCOUNTER — Encounter: Payer: Self-pay | Admitting: *Deleted

## 2020-07-27 ENCOUNTER — Other Ambulatory Visit: Payer: Self-pay

## 2020-07-27 ENCOUNTER — Inpatient Hospital Stay: Payer: Medicaid Other

## 2020-07-27 ENCOUNTER — Inpatient Hospital Stay (HOSPITAL_BASED_OUTPATIENT_CLINIC_OR_DEPARTMENT_OTHER): Payer: Medicaid Other | Admitting: Oncology

## 2020-07-27 VITALS — BP 146/97 | HR 77 | Temp 98.0°F | Resp 18 | Ht 73.0 in | Wt 241.2 lb

## 2020-07-27 DIAGNOSIS — C20 Malignant neoplasm of rectum: Secondary | ICD-10-CM

## 2020-07-27 DIAGNOSIS — Z452 Encounter for adjustment and management of vascular access device: Secondary | ICD-10-CM

## 2020-07-27 DIAGNOSIS — Z5111 Encounter for antineoplastic chemotherapy: Secondary | ICD-10-CM | POA: Diagnosis not present

## 2020-07-27 LAB — CBC WITH DIFFERENTIAL (CANCER CENTER ONLY)
Abs Immature Granulocytes: 0.03 10*3/uL (ref 0.00–0.07)
Basophils Absolute: 0 10*3/uL (ref 0.0–0.1)
Basophils Relative: 0 %
Eosinophils Absolute: 0.2 10*3/uL (ref 0.0–0.5)
Eosinophils Relative: 3 %
HCT: 35.4 % — ABNORMAL LOW (ref 39.0–52.0)
Hemoglobin: 11.2 g/dL — ABNORMAL LOW (ref 13.0–17.0)
Immature Granulocytes: 0 %
Lymphocytes Relative: 10 %
Lymphs Abs: 0.8 10*3/uL (ref 0.7–4.0)
MCH: 28.9 pg (ref 26.0–34.0)
MCHC: 31.6 g/dL (ref 30.0–36.0)
MCV: 91.5 fL (ref 80.0–100.0)
Monocytes Absolute: 1.1 10*3/uL — ABNORMAL HIGH (ref 0.1–1.0)
Monocytes Relative: 14 %
Neutro Abs: 5.7 10*3/uL (ref 1.7–7.7)
Neutrophils Relative %: 73 %
Platelet Count: 139 10*3/uL — ABNORMAL LOW (ref 150–400)
RBC: 3.87 MIL/uL — ABNORMAL LOW (ref 4.22–5.81)
RDW: 17.4 % — ABNORMAL HIGH (ref 11.5–15.5)
WBC Count: 7.8 10*3/uL (ref 4.0–10.5)
nRBC: 0 % (ref 0.0–0.2)

## 2020-07-27 LAB — CMP (CANCER CENTER ONLY)
ALT: 11 U/L (ref 0–44)
AST: 12 U/L — ABNORMAL LOW (ref 15–41)
Albumin: 3.9 g/dL (ref 3.5–5.0)
Alkaline Phosphatase: 113 U/L (ref 38–126)
Anion gap: 10 (ref 5–15)
BUN: 38 mg/dL — ABNORMAL HIGH (ref 6–20)
CO2: 21 mmol/L — ABNORMAL LOW (ref 22–32)
Calcium: 8.4 mg/dL — ABNORMAL LOW (ref 8.9–10.3)
Chloride: 108 mmol/L (ref 98–111)
Creatinine: 3.23 mg/dL (ref 0.61–1.24)
GFR, Estimated: 22 mL/min — ABNORMAL LOW (ref >60)
Glucose, Bld: 134 mg/dL — ABNORMAL HIGH (ref 70–99)
Potassium: 3.8 mmol/L (ref 3.5–5.1)
Sodium: 139 mmol/L (ref 135–145)
Total Bilirubin: 0.5 mg/dL (ref 0.3–1.2)
Total Protein: 7.4 g/dL (ref 6.5–8.1)

## 2020-07-27 MED ORDER — DEXTROSE 5 % IV SOLN
Freq: Once | INTRAVENOUS | Status: AC
Start: 2020-07-27 — End: 2020-07-27
  Filled 2020-07-27: qty 250

## 2020-07-27 MED ORDER — PALONOSETRON HCL INJECTION 0.25 MG/5ML
0.2500 mg | Freq: Once | INTRAVENOUS | Status: AC
Start: 2020-07-27 — End: 2020-07-27
  Administered 2020-07-27: 0.25 mg via INTRAVENOUS
  Filled 2020-07-27: qty 5

## 2020-07-27 MED ORDER — SODIUM CHLORIDE 0.9% FLUSH
10.0000 mL | Freq: Once | INTRAVENOUS | Status: AC
  Administered 2020-07-27: 10 mL
  Filled 2020-07-27: qty 10

## 2020-07-27 MED ORDER — SODIUM CHLORIDE 0.9% FLUSH
10.0000 mL | Freq: Once | INTRAVENOUS | Status: AC
Start: 2020-07-27 — End: 2020-07-27
  Administered 2020-07-27: 10 mL
  Filled 2020-07-27: qty 10

## 2020-07-27 MED ORDER — LEUCOVORIN CALCIUM INJECTION 350 MG
300.0000 mg/m2 | Freq: Once | INTRAVENOUS | Status: AC
  Administered 2020-07-27: 718 mg via INTRAVENOUS
  Filled 2020-07-27: qty 35.9

## 2020-07-27 MED ORDER — FLUOROURACIL CHEMO INJECTION 2.5 GM/50ML
300.0000 mg/m2 | Freq: Once | INTRAVENOUS | Status: AC
  Administered 2020-07-27: 700 mg via INTRAVENOUS
  Filled 2020-07-27: qty 14

## 2020-07-27 MED ORDER — SODIUM CHLORIDE 0.9 % IV SOLN
10.0000 mg | Freq: Once | INTRAVENOUS | Status: AC
  Administered 2020-07-27: 10 mg via INTRAVENOUS
  Filled 2020-07-27: qty 1

## 2020-07-27 MED ORDER — HEPARIN SOD (PORK) LOCK FLUSH 100 UNIT/ML IV SOLN
500.0000 [IU] | Freq: Once | INTRAVENOUS | Status: AC
  Administered 2020-07-27: 500 [IU] via INTRAVENOUS
  Filled 2020-07-27: qty 5

## 2020-07-27 MED ORDER — OXALIPLATIN CHEMO INJECTION 100 MG/20ML
65.0000 mg/m2 | Freq: Once | INTRAVENOUS | Status: AC
  Administered 2020-07-27: 155 mg via INTRAVENOUS
  Filled 2020-07-27: qty 31

## 2020-07-27 MED ORDER — SODIUM CHLORIDE 0.9 % IV SOLN
2000.0000 mg/m2 | INTRAVENOUS | Status: DC
  Administered 2020-07-27: 4800 mg via INTRAVENOUS
  Filled 2020-07-27: qty 96

## 2020-07-27 NOTE — Progress Notes (Signed)
CRITICAL VALUE STICKER  CRITICAL VALUE: creatinine 3.23  RECEIVER (on-site recipient of call):Elizabet Schweppe, RN  DATE & TIME NOTIFIED: 07/27/20 @ 0929  MESSENGER (representative from lab):Phyllis MD NOTIFIED: Dr. Benay Spice   TIME OF NOTIFICATION:0930  RESPONSE: His usual results. OK to treat.

## 2020-07-27 NOTE — Progress Notes (Signed)
Per Dr. Benay Spice: OK to treat w/creatinine 3.23

## 2020-07-27 NOTE — Progress Notes (Signed)
  [facility] OFFICE PROGRESS NOTE   Diagnosis: Rectal cancer  INTERVAL HISTORY:   Craig West completed another cycle of FOLFOX on 07/13/2020.  He has persistent cold sensitivity.  No other neuropathy symptoms.  No nausea or vomiting.  He reports diarrhea for the past few days.  Objective:  Vital signs in last 24 hours:  Blood pressure (!) 146/97, pulse 77, temperature 98 F (36.7 C), temperature source Tympanic, resp. rate 18, height 6\' 1"  (1.854 m), weight 241 lb 3.2 oz (109.4 kg), SpO2 97 %.    HEENT: No thrush or ulcers Resp: Lungs clear bilaterally Cardio: Regular rhythm, S3 gallop? GI: No hepatosplenomegaly Vascular: Trace pitting edema at the right greater than left lower leg, no erythema or tenderness, trace edema at the right lower arm  Skin: Hyperpigmentation of the hands  Portacath/PICC-without erythema  Lab Results:  Lab Results  Component Value Date   WBC 7.8 07/27/2020   HGB 11.2 (L) 07/27/2020   HCT 35.4 (L) 07/27/2020   MCV 91.5 07/27/2020   PLT 139 (L) 07/27/2020   NEUTROABS 5.7 07/27/2020    CMP  Lab Results  Component Value Date   NA 135 07/13/2020   K 3.7 07/13/2020   CL 101 07/13/2020   CO2 24 07/13/2020   GLUCOSE 179 (H) 07/13/2020   BUN 39 (H) 07/13/2020   CREATININE 3.26 (HH) 07/13/2020   CALCIUM 7.9 (L) 07/13/2020   PROT 6.9 07/13/2020   ALBUMIN 3.6 07/13/2020   AST 16 07/13/2020   ALT 14 07/13/2020   ALKPHOS 127 (H) 07/13/2020   BILITOT 0.3 07/13/2020   GFRNONAA 21 (L) 07/13/2020   GFRAA 15 (L) 01/03/2020    Lab Results  Component Value Date   CEA1 2.53 06/14/2020    Medications: I have reviewed the patient's current medications.   Assessment/Plan: 1. Rectal cancer-rectal mass at 5-6 cm from the anal verge on colonoscopy 01/11/2020, biopsy confirmed adenocarcinoma ? CTs 01/11/2020-rectal thickening, small perirectal lymph nodes and external iliac nodes, moderate right pleural effusion ? MRI pelvis  01/27/2020-[id number] tumor at 7.1 cm from the internal anal sphincter, multiple mesorectal nodes, indistinct left pelvic sidewall node and enlarged bilateral external iliac nodes ? PET scan 02/11/2020-hypermetabolic rectosigmoid mass, hypermetabolic mesorectal and sigmoid mesocolon lymph nodes, left external iliac/obturator node with mild hypermetabolism, 18 mm left inguinal node with normal architecture and slight hypermetabolism ? Radiation and infusional 5-FU 02/21/2020-04/04/2020 ? CTs 05/09/2020-decreased perirectal and sigmoid mesocolon lymphadenopathy, no evidence of disease progression ? MRI pelvis 05/17/2020-no well-defined residual rectal mass seen. Wall appears slightly irregular. No perirectal extension of tumor. Again identified is perirectal adenopathy, similar to most recent CT 05/10/2020 but improved compared to PET/CT of 02/11/2020. ? Cycle 1 FOLFOX 06/14/2020 ? 06/27/2020 chemotherapy held due to hypertension ? Cycle 2 FOLFOX 06/29/2020 ? Cycle 3 FOLFOX 07/13/2020 ? Cycle 4 FOLFOX 07/27/2020 2. Anemia 3. Chronic renal failure 4. Congestive heart failure-severe LVH 5. Diabetes 6. Gout 7. Peripheral neuropathy? 8. Hypertension     Disposition: [name] appears stable.  He will complete another cycle of FOLFOX today.  He will return for an office visit and chemotherapy in 2 weeks.  I will contact Dr. Hester Mates after 6 cycles of FOLFOX to begin surgical planning.  He will try Imodium for diarrhea.  He will contact us if this does not help the diarrhea.  Betsy Coder, MD  07/27/2020  8:59 AM

## 2020-07-29 ENCOUNTER — Inpatient Hospital Stay: Payer: Medicaid Other

## 2020-07-29 ENCOUNTER — Other Ambulatory Visit: Payer: Self-pay

## 2020-07-29 VITALS — BP 113/78 | HR 71 | Temp 97.5°F | Resp 19

## 2020-07-29 DIAGNOSIS — Z5111 Encounter for antineoplastic chemotherapy: Secondary | ICD-10-CM | POA: Diagnosis not present

## 2020-07-29 MED ORDER — SODIUM CHLORIDE 0.9% FLUSH
10.0000 mL | INTRAVENOUS | Status: DC | PRN
  Administered 2020-07-29: 10 mL
  Filled 2020-07-29: qty 10

## 2020-07-29 MED ORDER — HEPARIN SOD (PORK) LOCK FLUSH 100 UNIT/ML IV SOLN
500.0000 [IU] | Freq: Once | INTRAVENOUS | Status: AC | PRN
  Administered 2020-07-29: 250 [IU]
  Filled 2020-07-29: qty 5

## 2020-08-01 ENCOUNTER — Other Ambulatory Visit: Payer: Self-pay

## 2020-08-01 ENCOUNTER — Inpatient Hospital Stay: Payer: Medicaid Other | Attending: Oncology

## 2020-08-01 ENCOUNTER — Ambulatory Visit (INDEPENDENT_AMBULATORY_CARE_PROVIDER_SITE_OTHER): Payer: Medicaid Other | Admitting: General Practice

## 2020-08-01 ENCOUNTER — Encounter: Payer: Self-pay | Admitting: General Practice

## 2020-08-01 VITALS — BP 128/86 | HR 68 | Ht 73.0 in | Wt 236.6 lb

## 2020-08-01 DIAGNOSIS — I4729 Other ventricular tachycardia: Secondary | ICD-10-CM

## 2020-08-01 DIAGNOSIS — D649 Anemia, unspecified: Secondary | ICD-10-CM | POA: Insufficient documentation

## 2020-08-01 DIAGNOSIS — I509 Heart failure, unspecified: Secondary | ICD-10-CM | POA: Insufficient documentation

## 2020-08-01 DIAGNOSIS — I10 Essential (primary) hypertension: Secondary | ICD-10-CM

## 2020-08-01 DIAGNOSIS — I5031 Acute diastolic (congestive) heart failure: Secondary | ICD-10-CM | POA: Diagnosis not present

## 2020-08-01 DIAGNOSIS — I472 Ventricular tachycardia: Secondary | ICD-10-CM | POA: Diagnosis not present

## 2020-08-01 DIAGNOSIS — C20 Malignant neoplasm of rectum: Secondary | ICD-10-CM | POA: Diagnosis present

## 2020-08-01 DIAGNOSIS — Z452 Encounter for adjustment and management of vascular access device: Secondary | ICD-10-CM | POA: Diagnosis not present

## 2020-08-01 DIAGNOSIS — E1122 Type 2 diabetes mellitus with diabetic chronic kidney disease: Secondary | ICD-10-CM | POA: Insufficient documentation

## 2020-08-01 DIAGNOSIS — Z5111 Encounter for antineoplastic chemotherapy: Secondary | ICD-10-CM | POA: Insufficient documentation

## 2020-08-01 DIAGNOSIS — I130 Hypertensive heart and chronic kidney disease with heart failure and stage 1 through stage 4 chronic kidney disease, or unspecified chronic kidney disease: Secondary | ICD-10-CM | POA: Insufficient documentation

## 2020-08-01 DIAGNOSIS — N184 Chronic kidney disease, stage 4 (severe): Secondary | ICD-10-CM | POA: Diagnosis not present

## 2020-08-01 DIAGNOSIS — N189 Chronic kidney disease, unspecified: Secondary | ICD-10-CM | POA: Diagnosis not present

## 2020-08-01 NOTE — Patient Instructions (Signed)
[facility]  Discharge Instructions: Thank you for choosing [facility] to provide your oncology and hematology care.   If you have a lab appointment with the [facility], please go directly to the [facility] and check in at the registration area.   Wear comfortable clothing and clothing appropriate for easy access to any Portacath or PICC line.   We strive to give you quality time with your provider. You may need to reschedule your appointment if you arrive late (15 or more minutes).  Arriving late affects you and other patients whose appointments are after yours.  Also, if you miss three or more appointments without notifying the office, you may be dismissed from the clinic at the provider's discretion.      For prescription refill requests, have your pharmacy contact our office and allow 72 hours for refills to be completed.    Today you received the following:  PICC redressed   To help prevent nausea and vomiting after your treatment, we encourage you to take your nausea medication as directed.  BELOW ARE SYMPTOMS THAT SHOULD BE REPORTED IMMEDIATELY: . *FEVER GREATER THAN 100.4 F (38 C) OR HIGHER . *CHILLS OR SWEATING . *NAUSEA AND VOMITING THAT IS NOT CONTROLLED WITH YOUR NAUSEA MEDICATION . *UNUSUAL SHORTNESS OF BREATH . *UNUSUAL BRUISING OR BLEEDING . *URINARY PROBLEMS (pain or burning when urinating, or frequent urination) . *BOWEL PROBLEMS (unusual diarrhea, constipation, pain near the anus) . TENDERNESS IN MOUTH AND THROAT WITH OR WITHOUT PRESENCE OF ULCERS (sore throat, sores in mouth, or a toothache) . UNUSUAL RASH, SWELLING OR PAIN  . UNUSUAL VAGINAL DISCHARGE OR ITCHING   Items with * indicate a potential emergency and should be followed up as soon as possible or go to the Emergency Department if any problems should occur.  Please show the CHEMOTHERAPY ALERT CARD or IMMUNOTHERAPY ALERT CARD at check-in to the Emergency  Department and triage nurse.  Should you have questions after your visit or need to cancel or reschedule your appointment, please contact [facility]  Dept: [phone]  and follow the prompts.  Office hours are 8:00 a.m. to 4:30 p.m. Monday - Friday. Please note that voicemails left after 4:00 p.m. may not be returned until the following business day.  We are closed weekends and major holidays. You have access to a nurse at all times for urgent questions. Please call the main number to the clinic Dept: [phone] and follow the prompts.   For any non-urgent questions, you may also contact your provider using MyChart. We now offer e-Visits for anyone 43 and older to request care online for non-urgent symptoms. For details visit [email or web address].   Also download the MyChart app! Go to the app store, search "MyChart", open the app, select [facility], and log in with your MyChart username and password.  Due to Covid, a mask is required upon entering the hospital/clinic. If you do not have a mask, one will be given to you upon arrival. For doctor visits, patients may have 1 support person aged 6 or older with them. For treatment visits, patients cannot have anyone with them due to current Covid guidelines and our immunocompromised population.    PICC Home Care Guide A peripherally inserted central catheter (PICC) is a form of IV access that allows medicines and IV fluids to be quickly distributed throughout the body. The PICC is a long, thin, flexible tube (catheter) that is inserted into a  vein in the upper arm. The catheter ends in a large vein in the chest (superior vena cava, or SVC). After the PICC is inserted, a chest X-ray may be done to make sure that it is in the correct place. A PICC may be placed for different reasons, such as:  To give medicines and liquid nutrition.  To give IV fluids and blood products.  If there is trouble placing a peripheral  intravenous (PIV) catheter. If taken care of properly, a PICC can remain in place for several months. Having a PICC can also allow a person to go home from the hospital sooner. Medicine and PICC care can be managed at home by a family member, caregiver, or home health care team. What are the risks? Generally, having a PICC is safe. However, problems may occur, including:  A blood clot (thrombus) forming in or at the tip of the PICC.  A blood clot forming in a vein (deep vein thrombosis) or traveling to the lung (pulmonary embolism).  Inflammation of the vein (phlebitis) in which the PICC is placed.  Infection. Central line associated blood stream infection (CLABSI) is a serious infection that often requires hospitalization.  PICC movement (malposition). The PICC tip may move from its original position due to excessive physical activity, forceful coughing, sneezing, or vomiting.  A break or cut in the PICC. It is important not to use scissors near the PICC.  Nerve or tendon irritation or injury during PICC insertion. How to take care of your PICC Preventing problems  You and any caregivers should wash your hands often with soap. Wash hands: ? Before touching the PICC line or the infusion device. ? Before changing a bandage (dressing).  Flush the PICC as told by your health care provider. Let your health care provider know right away if the PICC is hard to flush or does not flush. Do not use force to flush the PICC.  Do not use a syringe that is less than 10 mL to flush the PICC.  Avoid blood pressure checks on the arm in which the PICC is placed.  Never pull or tug on the PICC.  Do not take the PICC out yourself. Only a trained clinical professional should remove the PICC.  Use clean and sterile supplies only. Keep the supplies in a dry place. Do not reuse needles, syringes, or any other supplies. Doing that can lead to infection.  Keep pets and children away from your PICC  line.  Check the PICC insertion site every day for signs of infection. Check for: ? Leakage. ? Redness, swelling, or pain. ? Fluid or blood. ? Warmth. ? Pus or a bad smell. PICC dressing care  Keep your PICC bandage (dressing) clean and dry to prevent infection.  Do not take baths, swim, or use a hot tub until your health care provider approves. Ask your health care provider if you can take showers. You may only be allowed to take sponge baths for bathing. When you are allowed to shower: ? Ask your health care provider to teach you how to wrap the PICC line. ? Cover the PICC line with clear plastic wrap and tape to keep it dry while showering.  Follow instructions from your health care provider about how to take care of your insertion site and dressing. Make sure you: ? Wash your hands with soap and water before you change your bandage (dressing). If soap and water are not available, use hand sanitizer. ? Change your dressing as  told by your health care provider. ? Leave stitches (sutures), skin glue, or adhesive strips in place. These skin closures may need to stay in place for 2 weeks or longer. If adhesive strip edges start to loosen and curl up, you may trim the loose edges. Do not remove adhesive strips completely unless your health care provider tells you to do that.  Change your PICC dressing if it becomes loose or wet. General instructions  Carry your PICC identification card or wear a medical alert bracelet at all times.  Keep the tube clamped at all times, unless it is being used.  Carry a smooth-edge clamp with you at all times to place on the tube if it breaks.  Do not use scissors or sharp objects near the tube.  You may bend your arm and move it freely. If your PICC is near or at the bend of your elbow, avoid activity with repeated motion at the elbow.  Avoid lifting heavy objects as told by your health care provider.  Keep all follow-up visits as told by your health  care provider. This is important.   Disposal of supplies  Throw away any syringes in a disposal container that is meant for sharp items (sharps container). You can buy a sharps container from a pharmacy, or you can make one by using an empty hard plastic bottle with a cover.  Place any used dressings or infusion bags into a plastic bag. Throw that bag in the trash. Contact a health care provider if:  You have pain in your arm, ear, face, or teeth.  You have a fever or chills.  You have redness, swelling, or pain around the insertion site.  You have fluid or blood coming from the insertion site.  Your insertion site feels warm to the touch.  You have pus or a bad smell coming from the insertion site.  Your skin feels hard and raised around the insertion site. Get help right away if:  Your PICC is accidentally pulled all the way out. If this happens, cover the insertion site with a bandage or gauze dressing. Do not throw the PICC away. Your health care provider will need to check it.  Your PICC was tugged or pulled and has partially come out. Do not  push the PICC back in.  You cannot flush the PICC, it is hard to flush, or the PICC leaks around the insertion site when it is flushed.  You hear a "flushing" sound when the PICC is flushed.  You feel your heart racing or skipping beats.  There is a hole or tear in the PICC.  You have swelling in the arm in which the PICC was inserted.  You have a red streak going up your arm from where the PICC was inserted. Summary  A peripherally inserted central catheter (PICC) is a long, thin, flexible tube (catheter) that is inserted into a vein in the upper arm.  The PICC is inserted using a sterile technique by a specially trained nurse or physician. Only a trained clinical professional should remove it.  Keep your PICC identification card with you at all times.  Avoid blood pressure checks on the arm in which the PICC is placed.  If  cared for properly, a PICC can remain in place for several months. Having a PICC can also allow a person to go home from the hospital sooner. This information is not intended to replace advice given to you by your health care provider. Make  sure you discuss any questions you have with your health care provider. Document Revised: 07/28/2019 Document Reviewed: 07/28/2019 Elsevier Patient Education  2021 [employer].

## 2020-08-01 NOTE — Patient Instructions (Signed)
Medication Instructions:  The current medical regimen is effective;  continue present plan and medications as directed. Please refer to the Current Medication list given to you today. *If you need a refill on your cardiac medications before your next appointment, please call your pharmacy*  Lab Work:   Testing/Procedures:  NONE    NONE  Special Instructions TAKE AND LOG YOUR BLOOD PRESSURE  PLEASE READ AND FOLLOW SALTY 6-ATTACHED-1,800mg  daily  PLEASE INCREASE PHYSICAL ACTIVITY AS TOLERATED  Follow-Up: Your next appointment:  3 month(s) Virtual Visit  with Skeet Latch, MD OR IF UNAVAILABLE [place], FNP-C   At [facility], you and your health needs are our priority.  As part of our continuing mission to provide you with exceptional heart care, we have created designated Provider Care Teams.  These Care Teams include your primary Cardiologist (physician) and Advanced Practice Providers (APPs -  Physician Assistants and Nurse Practitioners) who all work together to provide you with the care you need, when you need it.  We recommend signing up for the patient portal called "MyChart".  Sign up information is provided on this After Visit Summary.  MyChart is used to connect with patients for Virtual Visits (Telemedicine).  Patients are able to view lab/test results, encounter notes, upcoming appointments, etc.  Non-urgent messages can be sent to your provider as well.   To learn more about what you can do with MyChart, go to [email or web address].              6 SALTY THINGS TO AVOID     1,800MG  DAILY

## 2020-08-01 NOTE — Progress Notes (Signed)
Pt states the dizziness is better. Declined any medical intervention

## 2020-08-04 ENCOUNTER — Other Ambulatory Visit: Payer: Self-pay

## 2020-08-04 ENCOUNTER — Inpatient Hospital Stay: Payer: Medicaid Other

## 2020-08-04 VITALS — BP 119/82 | HR 68 | Temp 98.4°F | Resp 17

## 2020-08-04 DIAGNOSIS — Z5111 Encounter for antineoplastic chemotherapy: Secondary | ICD-10-CM | POA: Diagnosis not present

## 2020-08-04 DIAGNOSIS — C20 Malignant neoplasm of rectum: Secondary | ICD-10-CM

## 2020-08-04 DIAGNOSIS — Z452 Encounter for adjustment and management of vascular access device: Secondary | ICD-10-CM

## 2020-08-04 MED ORDER — SODIUM CHLORIDE 0.9% FLUSH
10.0000 mL | Freq: Once | INTRAVENOUS | Status: DC
  Filled 2020-08-04: qty 10

## 2020-08-04 MED ORDER — HEPARIN SOD (PORK) LOCK FLUSH 100 UNIT/ML IV SOLN
250.0000 [IU] | Freq: Once | INTRAVENOUS | Status: DC
  Filled 2020-08-04: qty 5

## 2020-08-05 ENCOUNTER — Other Ambulatory Visit: Payer: Self-pay | Admitting: Oncology

## 2020-08-09 ENCOUNTER — Inpatient Hospital Stay: Payer: Medicaid Other

## 2020-08-09 ENCOUNTER — Inpatient Hospital Stay (HOSPITAL_BASED_OUTPATIENT_CLINIC_OR_DEPARTMENT_OTHER): Payer: Medicaid Other | Admitting: Oncology

## 2020-08-09 ENCOUNTER — Encounter: Payer: Self-pay | Admitting: Oncology

## 2020-08-09 ENCOUNTER — Other Ambulatory Visit: Payer: Self-pay

## 2020-08-09 VITALS — BP 116/71 | HR 72 | Temp 98.1°F | Resp 19 | Ht 73.0 in | Wt 242.8 lb

## 2020-08-09 DIAGNOSIS — C20 Malignant neoplasm of rectum: Secondary | ICD-10-CM | POA: Diagnosis not present

## 2020-08-09 DIAGNOSIS — Z5111 Encounter for antineoplastic chemotherapy: Secondary | ICD-10-CM | POA: Diagnosis not present

## 2020-08-09 LAB — CBC WITH DIFFERENTIAL (CANCER CENTER ONLY)
Abs Immature Granulocytes: 0.04 10*3/uL (ref 0.00–0.07)
Basophils Absolute: 0.1 10*3/uL (ref 0.0–0.1)
Basophils Relative: 1 %
Eosinophils Absolute: 0.2 10*3/uL (ref 0.0–0.5)
Eosinophils Relative: 3 %
HCT: 33 % — ABNORMAL LOW (ref 39.0–52.0)
Hemoglobin: 10.5 g/dL — ABNORMAL LOW (ref 13.0–17.0)
Immature Granulocytes: 1 %
Lymphocytes Relative: 10 %
Lymphs Abs: 0.9 10*3/uL (ref 0.7–4.0)
MCH: 28.8 pg (ref 26.0–34.0)
MCHC: 31.8 g/dL (ref 30.0–36.0)
MCV: 90.4 fL (ref 80.0–100.0)
Monocytes Absolute: 0.9 10*3/uL (ref 0.1–1.0)
Monocytes Relative: 10 %
Neutro Abs: 6.3 10*3/uL (ref 1.7–7.7)
Neutrophils Relative %: 75 %
Platelet Count: 118 10*3/uL — ABNORMAL LOW (ref 150–400)
RBC: 3.65 MIL/uL — ABNORMAL LOW (ref 4.22–5.81)
RDW: 17.9 % — ABNORMAL HIGH (ref 11.5–15.5)
WBC Count: 8.4 10*3/uL (ref 4.0–10.5)
nRBC: 0 % (ref 0.0–0.2)

## 2020-08-09 LAB — CMP (CANCER CENTER ONLY)
ALT: 13 U/L (ref 0–44)
AST: 18 U/L (ref 15–41)
Albumin: 3.7 g/dL (ref 3.5–5.0)
Alkaline Phosphatase: 83 U/L (ref 38–126)
Anion gap: 10 (ref 5–15)
BUN: 43 mg/dL — ABNORMAL HIGH (ref 6–20)
CO2: 23 mmol/L (ref 22–32)
Calcium: 8 mg/dL — ABNORMAL LOW (ref 8.9–10.3)
Chloride: 104 mmol/L (ref 98–111)
Creatinine: 3.03 mg/dL (ref 0.61–1.24)
GFR, Estimated: 23 mL/min — ABNORMAL LOW (ref >60)
Glucose, Bld: 138 mg/dL — ABNORMAL HIGH (ref 70–99)
Potassium: 4.3 mmol/L (ref 3.5–5.1)
Sodium: 137 mmol/L (ref 135–145)
Total Bilirubin: 0.5 mg/dL (ref 0.3–1.2)
Total Protein: 6.9 g/dL (ref 6.5–8.1)

## 2020-08-09 NOTE — Progress Notes (Signed)
[facility]   Diagnosis: Rectal cancer  INTERVAL HISTORY:   Craig West completed another cycle of FOLFOX on 07/27/2020.  No nausea/vomiting or mouth sores.  Cold sensitivity lasted for greater than 1 week following chemotherapy.  No peripheral numbness at present.  He reports having diarrhea 4-5 times per day.  He is not taking Imodium.  He had approximately 2 stools per day prior to beginning FOLFOX.  Objective:  Vital signs in last 24 hours:  Blood pressure 116/71, pulse 72, temperature 98.1 F (36.7 C), temperature source Oral, resp. rate 19, height 6\' 1"  (1.854 m), weight 242 lb 12.8 oz (110.1 kg), SpO2 98 %.    HEENT: No thrush or ulcers Resp: Lungs clear bilaterally Cardio: Regular rate and rhythm, S3 gallop? GI: No hepatosplenomegaly Vascular: No leg edema Neuro: Very mild to mild loss of vibratory sense at the fingertips bilaterally   Portacath/PICC-without erythema  Lab Results:  Lab Results  Component Value Date   WBC 8.4 08/09/2020   HGB 10.5 (L) 08/09/2020   HCT 33.0 (L) 08/09/2020   MCV 90.4 08/09/2020   PLT 118 (L) 08/09/2020   NEUTROABS 6.3 08/09/2020    CMP  Lab Results  Component Value Date   NA 139 07/27/2020   K 3.8 07/27/2020   CL 108 07/27/2020   CO2 21 (L) 07/27/2020   GLUCOSE 134 (H) 07/27/2020   BUN 38 (H) 07/27/2020   CREATININE 3.23 (HH) 07/27/2020   CALCIUM 8.4 (L) 07/27/2020   PROT 7.4 07/27/2020   ALBUMIN 3.9 07/27/2020   AST 12 (L) 07/27/2020   ALT 11 07/27/2020   ALKPHOS 113 07/27/2020   BILITOT 0.5 07/27/2020   GFRNONAA 22 (L) 07/27/2020   GFRAA 15 (L) 01/03/2020    Lab Results  Component Value Date   CEA1 2.53 06/14/2020     Medications: I have reviewed the patient's current medications.   Assessment/Plan: 1. Rectal cancer-rectal mass at 5-6 cm from the anal verge on colonoscopy 01/11/2020, biopsy confirmed adenocarcinoma ? CTs 01/11/2020-rectal thickening, small perirectal  lymph nodes and external iliac nodes, moderate right pleural effusion ? MRI pelvis 01/27/2020-[id number] tumor at 7.1 cm from the internal anal sphincter, multiple mesorectal nodes, indistinct left pelvic sidewall node and enlarged bilateral external iliac nodes ? PET scan 02/11/2020-hypermetabolic rectosigmoid mass, hypermetabolic mesorectal and sigmoid mesocolon lymph nodes, left external iliac/obturator node with mild hypermetabolism, 18 mm left inguinal node with normal architecture and slight hypermetabolism ? Radiation and infusional 5-FU 02/21/2020-04/04/2020 ? CTs 05/09/2020-decreased perirectal and sigmoid mesocolon lymphadenopathy, no evidence of disease progression ? MRI pelvis 05/17/2020-no well-defined residual rectal mass seen. Wall appears slightly irregular. No perirectal extension of tumor. Again identified is perirectal adenopathy, similar to most recent CT 05/10/2020 but improved compared to PET/CT of 02/11/2020. ? Cycle 1 FOLFOX 06/14/2020 ? 06/27/2020 chemotherapy held due to hypertension ? Cycle 2 FOLFOX 06/29/2020 ? Cycle 3 FOLFOX 07/13/2020 ? Cycle 4 FOLFOX 07/27/2020 ? Cycle 5 FOLFOX 08/09/2020 2. Anemia 3. Chronic renal failure 4. Congestive heart failure-severe LVH 5. Diabetes 6. Gout 7. Peripheral neuropathy? 8. Hypertension   Disposition: Craig West appears stable.  He has completed 4 cycles of FOLFOX.  He will complete cycle 5 today.  He does not have significant neuropathy symptoms at present with only mild loss of vibratory sense on exam.  We recommended he begin Imodium for diarrhea.  He will let us know if the Imodium does not help.  He will return for an office visit and  cycle 5 FOLFOX in 2 weeks.  The plan is to complete a total of 8 cycles of FOLFOX if he continues to tolerate the treatment well.    Betsy Coder, MD  08/09/2020  11:46 AM

## 2020-08-09 NOTE — Patient Instructions (Signed)
[facility]  Discharge Instructions: Thank you for choosing [facility] to provide your oncology and hematology care.   If you have a lab appointment with the [facility], please go directly to the [facility] and check in at the registration area.   Wear comfortable clothing and clothing appropriate for easy access to any Portacath or PICC line.   We strive to give you quality time with your provider. You may need to reschedule your appointment if you arrive late (15 or more minutes).  Arriving late affects you and other patients whose appointments are after yours.  Also, if you miss three or more appointments without notifying the office, you may be dismissed from the clinic at the provider's discretion.      For prescription refill requests, have your pharmacy contact our office and allow 72 hours for refills to be completed.    Today you received the following PICC redressed      To help prevent nausea and vomiting after your treatment, we encourage you to take your nausea medication as directed.  BELOW ARE SYMPTOMS THAT SHOULD BE REPORTED IMMEDIATELY: . *FEVER GREATER THAN 100.4 F (38 C) OR HIGHER . *CHILLS OR SWEATING . *NAUSEA AND VOMITING THAT IS NOT CONTROLLED WITH YOUR NAUSEA MEDICATION . *UNUSUAL SHORTNESS OF BREATH . *UNUSUAL BRUISING OR BLEEDING . *URINARY PROBLEMS (pain or burning when urinating, or frequent urination) . *BOWEL PROBLEMS (unusual diarrhea, constipation, pain near the anus) . TENDERNESS IN MOUTH AND THROAT WITH OR WITHOUT PRESENCE OF ULCERS (sore throat, sores in mouth, or a toothache) . UNUSUAL RASH, SWELLING OR PAIN  . UNUSUAL VAGINAL DISCHARGE OR ITCHING   Items with * indicate a potential emergency and should be followed up as soon as possible or go to the Emergency Department if any problems should occur.  Please show the CHEMOTHERAPY ALERT CARD or IMMUNOTHERAPY ALERT CARD at check-in to the Emergency  Department and triage nurse.  Should you have questions after your visit or need to cancel or reschedule your appointment, please contact [facility]  Dept: [phone]  and follow the prompts.  Office hours are 8:00 a.m. to 4:30 p.m. Monday - Friday. Please note that voicemails left after 4:00 p.m. may not be returned until the following business day.  We are closed weekends and major holidays. You have access to a nurse at all times for urgent questions. Please call the main number to the clinic Dept: [phone] and follow the prompts.   For any non-urgent questions, you may also contact your provider using MyChart. We now offer e-Visits for anyone 20 and older to request care online for non-urgent symptoms. For details visit [email or web address].   Also download the MyChart app! Go to the app store, search "MyChart", open the app, select [facility], and log in with your MyChart username and password.  Due to Covid, a mask is required upon entering the hospital/clinic. If you do not have a mask, one will be given to you upon arrival. For doctor visits, patients may have 1 support person aged 44 or older with them. For treatment visits, patients cannot have anyone with them due to current Covid guidelines and our immunocompromised population.    PICC Insertion, Care After This sheet gives you information about how to care for yourself after your procedure. Your health care provider may also give you more specific instructions. If you have problems or questions, contact your health care provider. What  can I expect after the procedure? After your procedure, it is common to have mild discomfort at the insertion site. Follow these instructions at home: Activity  Rest as told by your health care provider. Ask your health care provider when you can return to your normal activities.  If your PICC is near or at the bend of your elbow, avoid activity with repeated  motion at the elbow.  Do not lift anything that is heavier than 10 lb (4.5 kg), or the limit that you are told, until your health care provider says that it is safe.  Avoid using a crutch with the arm on the same side as your PICC. You may need to use a walker.  Avoid any activity that requires great effort as told by your health care provider.   Bathing  Do not take baths, swim, or use a hot tub until your health care provider approves. Ask your health care provider if you can take showers. You may only be allowed to take sponge baths for bathing.  Keep the bandage (dressing) dry until your health care provider says it can be removed.   General instructions  Do not drive for 24 hours if you were given a medicine to help you relax (sedative).  Take over-the-counter and prescription medicines only as told by your health care provider.  Keep all follow-up visits as told by your health care provider. This is important. Contact a health care provider if:  You have a fever or chills.  You have more redness, swelling, or pain around the insertion site.  You have fluid or blood coming from the insertion site.  Your insertion site feels warm to the touch.  You have pus or a bad smell coming from the insertion site.  Your vein feels hard and raised like a "cord" under the skin around the insertion site. Get help right away if:  You have swelling in the arm in which the PICC was inserted.  You have numbness or tingling in your fingers, hand, or arm.  You have a red streak going up your arm from where the PICC was inserted.  Your PICC cannot be flushed, is hard to flush, or leaks around the insertion site when flushed.  You have pain in your arm, ear, face, or teeth.  Your PICC is accidentally pulled all the way out. If this happens, cover the insertion site with a bandage or gauze dressing. Do not throw the PICC away. Your health care provider will need to check it.  Your PICC was  tugged or pulled and has partially come out. Do not push the PICC back in.  You hear a "flushing" sound when the PICC is flushed.  You feel your heart racing or skipping beats.  There is a hole or tear in the PICC.  You have pain in your chest.  You have shortness of breath or difficulty breathing. Summary  After your procedure, it is common to have mild discomfort at the insertion site.  Do not lift anything that is heavier than 10 lb (4.5 kg), or the limit that you are told, until your health care provider says that it is safe.  Flush the PICC as told by your health care provider. Let your health care provider know right away if the PICC is hard to flush or does not flush. Do not use force to flush the PICC.  Check your insertion site every day for signs of infection. This information is not intended to  replace advice given to you by your health care provider. Make sure you discuss any questions you have with your health care provider. Document Revised: 07/29/2019 Document Reviewed: 07/29/2019 Elsevier Patient Education  [place].

## 2020-08-09 NOTE — Progress Notes (Signed)
CRITICAL VALUE STICKER  CRITICAL VALUE: Creatinine 3.03  RECEIVER (on-site recipient of call):Julliana Whitmyer,RN  DATE & TIME NOTIFIED: 08/09/20 @ 1150  MESSENGER (representative from lab): Otila Kluver  MD NOTIFIED: Dr. Benay Spice  TIME OF NOTIFICATION: 4445  RESPONSE: Stable--was 3.23 last draw

## 2020-08-10 ENCOUNTER — Inpatient Hospital Stay: Payer: Medicaid Other

## 2020-08-10 VITALS — BP 142/89 | HR 71 | Temp 97.7°F | Resp 18

## 2020-08-10 DIAGNOSIS — Z5111 Encounter for antineoplastic chemotherapy: Secondary | ICD-10-CM | POA: Diagnosis not present

## 2020-08-10 DIAGNOSIS — C20 Malignant neoplasm of rectum: Secondary | ICD-10-CM

## 2020-08-10 MED ORDER — OXALIPLATIN CHEMO INJECTION 100 MG/20ML
65.0000 mg/m2 | Freq: Once | INTRAVENOUS | Status: AC
  Administered 2020-08-10: 155 mg via INTRAVENOUS
  Filled 2020-08-10: qty 31

## 2020-08-10 MED ORDER — LEUCOVORIN CALCIUM INJECTION 350 MG
300.0000 mg/m2 | Freq: Once | INTRAVENOUS | Status: AC
  Administered 2020-08-10: 718 mg via INTRAVENOUS
  Filled 2020-08-10: qty 35.9

## 2020-08-10 MED ORDER — SODIUM CHLORIDE 0.9 % IV SOLN
2000.0000 mg/m2 | INTRAVENOUS | Status: DC
  Administered 2020-08-10: 4800 mg via INTRAVENOUS
  Filled 2020-08-10: qty 96

## 2020-08-10 MED ORDER — SODIUM CHLORIDE 0.9% FLUSH
10.0000 mL | INTRAVENOUS | Status: DC | PRN
  Administered 2020-08-10: 10 mL
  Filled 2020-08-10: qty 10

## 2020-08-10 MED ORDER — PALONOSETRON HCL INJECTION 0.25 MG/5ML
0.2500 mg | Freq: Once | INTRAVENOUS | Status: AC
Start: 2020-08-10 — End: 2020-08-10
  Administered 2020-08-10: 0.25 mg via INTRAVENOUS
  Filled 2020-08-10: qty 5

## 2020-08-10 MED ORDER — DEXTROSE 5 % IV SOLN
Freq: Once | INTRAVENOUS | Status: AC
  Filled 2020-08-10: qty 250

## 2020-08-10 MED ORDER — DEXAMETHASONE SODIUM PHOSPHATE 100 MG/10ML IJ SOLN
10.0000 mg | Freq: Once | INTRAMUSCULAR | Status: AC
  Administered 2020-08-10: 10 mg via INTRAVENOUS
  Filled 2020-08-10: qty 10

## 2020-08-10 MED ORDER — FLUOROURACIL CHEMO INJECTION 2.5 GM/50ML
300.0000 mg/m2 | Freq: Once | INTRAVENOUS | Status: AC
  Administered 2020-08-10: 700 mg via INTRAVENOUS
  Filled 2020-08-10: qty 14

## 2020-08-10 NOTE — Patient Instructions (Signed)
[facility]  Discharge Instructions: Thank you for choosing [facility] to provide your oncology and hematology care.   If you have a lab appointment with the [facility], please go directly to the [facility] and check in at the registration area.   Wear comfortable clothing and clothing appropriate for easy access to any Portacath or PICC line.   We strive to give you quality time with your provider. You may need to reschedule your appointment if you arrive late (15 or more minutes).  Arriving late affects you and other patients whose appointments are after yours.  Also, if you miss three or more appointments without notifying the office, you may be dismissed from the clinic at the provider's discretion.      For prescription refill requests, have your pharmacy contact our office and allow 72 hours for refills to be completed.    Today you received the following chemotherapy and/or immunotherapy agents: oxaliplatin, leucovorin, fluoruracil push/pump.       To help prevent nausea and vomiting after your treatment, we encourage you to take your nausea medication as directed.  BELOW ARE SYMPTOMS THAT SHOULD BE REPORTED IMMEDIATELY: . *FEVER GREATER THAN 100.4 F (38 C) OR HIGHER . *CHILLS OR SWEATING . *NAUSEA AND VOMITING THAT IS NOT CONTROLLED WITH YOUR NAUSEA MEDICATION . *UNUSUAL SHORTNESS OF BREATH . *UNUSUAL BRUISING OR BLEEDING . *URINARY PROBLEMS (pain or burning when urinating, or frequent urination) . *BOWEL PROBLEMS (unusual diarrhea, constipation, pain near the anus) . TENDERNESS IN MOUTH AND THROAT WITH OR WITHOUT PRESENCE OF ULCERS (sore throat, sores in mouth, or a toothache) . UNUSUAL RASH, SWELLING OR PAIN  . UNUSUAL VAGINAL DISCHARGE OR ITCHING   Items with * indicate a potential emergency and should be followed up as soon as possible or go to the Emergency Department if any problems should occur.  Please show the  CHEMOTHERAPY ALERT CARD or IMMUNOTHERAPY ALERT CARD at check-in to the Emergency Department and triage nurse.  Should you have questions after your visit or need to cancel or reschedule your appointment, please contact [facility]  Dept: [phone]  and follow the prompts.  Office hours are 8:00 a.m. to 4:30 p.m. Monday - Friday. Please note that voicemails left after 4:00 p.m. may not be returned until the following business day.  We are closed weekends and major holidays. You have access to a nurse at all times for urgent questions. Please call the main number to the clinic Dept: [phone] and follow the prompts.   For any non-urgent questions, you may also contact your provider using MyChart. We now offer e-Visits for anyone 49 and older to request care online for non-urgent symptoms. For details visit [email or web address].   Also download the MyChart app! Go to the app store, search "MyChart", open the app, select [facility], and log in with your MyChart username and password.  Due to Covid, a mask is required upon entering the hospital/clinic. If you do not have a mask, one will be given to you upon arrival. For doctor visits, patients may have 1 support person aged 74 or older with them. For treatment visits, patients cannot have anyone with them due to current Covid guidelines and our immunocompromised population.   Oxaliplatin Injection What is this medicine? OXALIPLATIN (ox AL i PLA tin) is a chemotherapy drug. It targets fast dividing cells, like cancer cells, and causes these cells to die. This medicine is used to  treat cancers of the colon and rectum, and many other cancers. This medicine may be used for other purposes; ask your health care provider or pharmacist if you have questions. COMMON BRAND NAME(S): Eloxatin What should I tell my health care provider before I take this medicine? They need to know if you have any of these conditions:  heart  disease  history of irregular heartbeat  liver disease  low blood counts, like white cells, platelets, or red blood cells  lung or breathing disease, like asthma  take medicines that treat or prevent blood clots  tingling of the fingers or toes, or other nerve disorder  an unusual or allergic reaction to oxaliplatin, other chemotherapy, other medicines, foods, dyes, or preservatives  pregnant or trying to get pregnant  breast-feeding How should I use this medicine? This drug is given as an infusion into a vein. It is administered in a hospital or clinic by a specially trained health care professional. Talk to your pediatrician regarding the use of this medicine in children. Special care may be needed. Overdosage: If you think you have taken too much of this medicine contact a poison control center or emergency room at once. NOTE: This medicine is only for you. Do not share this medicine with others. What if I miss a dose? It is important not to miss a dose. Call your doctor or health care professional if you are unable to keep an appointment. What may interact with this medicine? Do not take this medicine with any of the following medications:  cisapride  dronedarone  pimozide  thioridazine This medicine may also interact with the following medications:  aspirin and aspirin-like medicines  certain medicines that treat or prevent blood clots like warfarin, apixaban, dabigatran, and rivaroxaban  cisplatin  cyclosporine  diuretics  medicines for infection like acyclovir, adefovir, amphotericin B, bacitracin, cidofovir, foscarnet, ganciclovir, gentamicin, pentamidine, vancomycin  NSAIDs, medicines for pain and inflammation, like ibuprofen or naproxen  other medicines that prolong the QT interval (an abnormal heart rhythm)  pamidronate  zoledronic acid This list may not describe all possible interactions. Give your health care provider a list of all the medicines,  herbs, non-prescription drugs, or dietary supplements you use. Also tell them if you smoke, drink alcohol, or use illegal drugs. Some items may interact with your medicine. What should I watch for while using this medicine? Your condition will be monitored carefully while you are receiving this medicine. You may need blood work done while you are taking this medicine. This medicine may make you feel generally unwell. This is not uncommon as chemotherapy can affect healthy cells as well as cancer cells. Report any side effects. Continue your course of treatment even though you feel ill unless your healthcare professional tells you to stop. This medicine can make you more sensitive to cold. Do not drink cold drinks or use ice. Cover exposed skin before coming in contact with cold temperatures or cold objects. When out in cold weather wear warm clothing and cover your mouth and nose to warm the air that goes into your lungs. Tell your doctor if you get sensitive to the cold. Do not become pregnant while taking this medicine or for 9 months after stopping it. Women should inform their health care professional if they wish to become pregnant or think they might be pregnant. Men should not father a child while taking this medicine and for 6 months after stopping it. There is potential for serious side effects to an unborn  child. Talk to your health care professional for more information. Do not breast-feed a child while taking this medicine or for 3 months after stopping it. This medicine has caused ovarian failure in some women. This medicine may make it more difficult to get pregnant. Talk to your health care professional if you are concerned about your fertility. This medicine has caused decreased sperm counts in some men. This may make it more difficult to father a child. Talk to your health care professional if you are concerned about your fertility. This medicine may increase your risk of getting an  infection. Call your health care professional for advice if you get a fever, chills, or sore throat, or other symptoms of a cold or flu. Do not treat yourself. Try to avoid being around people who are sick. Avoid taking medicines that contain aspirin, acetaminophen, ibuprofen, naproxen, or ketoprofen unless instructed by your health care professional. These medicines may hide a fever. Be careful brushing or flossing your teeth or using a toothpick because you may get an infection or bleed more easily. If you have any dental work done, tell your dentist you are receiving this medicine. What side effects may I notice from receiving this medicine? Side effects that you should report to your doctor or health care professional as soon as possible:  allergic reactions like skin rash, itching or hives, swelling of the face, lips, or tongue  breathing problems  cough  low blood counts - this medicine may decrease the number of white blood cells, red blood cells, and platelets. You may be at increased risk for infections and bleeding  nausea, vomiting  pain, redness, or irritation at site where injected  pain, tingling, numbness in the hands or feet  signs and symptoms of bleeding such as bloody or black, tarry stools; red or dark brown urine; spitting up blood or brown material that looks like coffee grounds; red spots on the skin; unusual bruising or bleeding from the eyes, gums, or nose  signs and symptoms of a dangerous change in heartbeat or heart rhythm like chest pain; dizziness; fast, irregular heartbeat; palpitations; feeling faint or lightheaded; falls  signs and symptoms of infection like fever; chills; cough; sore throat; pain or trouble passing urine  signs and symptoms of liver injury like dark yellow or brown urine; general ill feeling or flu-like symptoms; light-colored stools; loss of appetite; nausea; right upper belly pain; unusually weak or tired; yellowing of the eyes or  skin  signs and symptoms of low red blood cells or anemia such as unusually weak or tired; feeling faint or lightheaded; falls  signs and symptoms of muscle injury like dark urine; trouble passing urine or change in the amount of urine; unusually weak or tired; muscle pain; back pain Side effects that usually do not require medical attention (report to your doctor or health care professional if they continue or are bothersome):  changes in taste  diarrhea  gas  hair loss  loss of appetite  mouth sores This list may not describe all possible side effects. Call your doctor for medical advice about side effects. You may report side effects to FDA at 1-800-FDA-1088. Where should I keep my medicine? This drug is given in a hospital or clinic and will not be stored at home. NOTE: This sheet is a summary. It may not cover all possible information. If you have questions about this medicine, talk to your doctor, pharmacist, or health care provider.  2021 Elsevier/Gold Standard (2018-08-05 12:20:35)  Leucovorin injection What is this medicine? LEUCOVORIN (loo koe VOR in) is used to prevent or treat the harmful effects of some medicines. This medicine is used to treat anemia caused by a low amount of folic acid in the body. It is also used with 5-fluorouracil (5-FU) to treat colon cancer. This medicine may be used for other purposes; ask your health care provider or pharmacist if you have questions. What should I tell my health care provider before I take this medicine? They need to know if you have any of these conditions:  anemia from low levels of vitamin B-12 in the blood  an unusual or allergic reaction to leucovorin, folic acid, other medicines, foods, dyes, or preservatives  pregnant or trying to get pregnant  breast-feeding How should I use this medicine? This medicine is for injection into a muscle or into a vein. It is given by a health care professional in a hospital or clinic  setting. Talk to your pediatrician regarding the use of this medicine in children. Special care may be needed. Overdosage: If you think you have taken too much of this medicine contact a poison control center or emergency room at once. NOTE: This medicine is only for you. Do not share this medicine with others. What if I miss a dose? This does not apply. What may interact with this medicine?  capecitabine  fluorouracil  phenobarbital  phenytoin  primidone  trimethoprim-sulfamethoxazole This list may not describe all possible interactions. Give your health care provider a list of all the medicines, herbs, non-prescription drugs, or dietary supplements you use. Also tell them if you smoke, drink alcohol, or use illegal drugs. Some items may interact with your medicine. What should I watch for while using this medicine? Your condition will be monitored carefully while you are receiving this medicine. This medicine may increase the side effects of 5-fluorouracil, 5-FU. Tell your doctor or health care professional if you have diarrhea or mouth sores that do not get better or that get worse. What side effects may I notice from receiving this medicine? Side effects that you should report to your doctor or health care professional as soon as possible:  allergic reactions like skin rash, itching or hives, swelling of the face, lips, or tongue  breathing problems  fever, infection  mouth sores  unusual bleeding or bruising  unusually weak or tired Side effects that usually do not require medical attention (report to your doctor or health care professional if they continue or are bothersome):  constipation or diarrhea  loss of appetite  nausea, vomiting This list may not describe all possible side effects. Call your doctor for medical advice about side effects. You may report side effects to FDA at 1-800-FDA-1088. Where should I keep my medicine? This drug is given in a hospital or  clinic and will not be stored at home. NOTE: This sheet is a summary. It may not cover all possible information. If you have questions about this medicine, talk to your doctor, pharmacist, or health care provider.  2021 Elsevier/Gold Standard (2007-09-22 16:50:29)  Fluorouracil, 5-FU injection What is this medicine? FLUOROURACIL, 5-FU (flure oh YOOR a sil) is a chemotherapy drug. It slows the growth of cancer cells. This medicine is used to treat many types of cancer like breast cancer, colon or rectal cancer, pancreatic cancer, and stomach cancer. This medicine may be used for other purposes; ask your health care provider or pharmacist if you have questions. COMMON BRAND NAME(S): Adrucil What should I  tell my health care provider before I take this medicine? They need to know if you have any of these conditions:  blood disorders  dihydropyrimidine dehydrogenase (DPD) deficiency  infection (especially a virus infection such as chickenpox, cold sores, or herpes)  kidney disease  liver disease  malnourished, poor nutrition  recent or ongoing radiation therapy  an unusual or allergic reaction to fluorouracil, other chemotherapy, other medicines, foods, dyes, or preservatives  pregnant or trying to get pregnant  breast-feeding How should I use this medicine? This drug is given as an infusion or injection into a vein. It is administered in a hospital or clinic by a specially trained health care professional. Talk to your pediatrician regarding the use of this medicine in children. Special care may be needed. Overdosage: If you think you have taken too much of this medicine contact a poison control center or emergency room at once. NOTE: This medicine is only for you. Do not share this medicine with others. What if I miss a dose? It is important not to miss your dose. Call your doctor or health care professional if you are unable to keep an appointment. What may interact with this  medicine? Do not take this medicine with any of the following medications:  live virus vaccines This medicine may also interact with the following medications:  medicines that treat or prevent blood clots like warfarin, enoxaparin, and dalteparin This list may not describe all possible interactions. Give your health care provider a list of all the medicines, herbs, non-prescription drugs, or dietary supplements you use. Also tell them if you smoke, drink alcohol, or use illegal drugs. Some items may interact with your medicine. What should I watch for while using this medicine? Visit your doctor for checks on your progress. This drug may make you feel generally unwell. This is not uncommon, as chemotherapy can affect healthy cells as well as cancer cells. Report any side effects. Continue your course of treatment even though you feel ill unless your doctor tells you to stop. In some cases, you may be given additional medicines to help with side effects. Follow all directions for their use. Call your doctor or health care professional for advice if you get a fever, chills or sore throat, or other symptoms of a cold or flu. Do not treat yourself. This drug decreases your body's ability to fight infections. Try to avoid being around people who are sick. This medicine may increase your risk to bruise or bleed. Call your doctor or health care professional if you notice any unusual bleeding. Be careful brushing and flossing your teeth or using a toothpick because you may get an infection or bleed more easily. If you have any dental work done, tell your dentist you are receiving this medicine. Avoid taking products that contain aspirin, acetaminophen, ibuprofen, naproxen, or ketoprofen unless instructed by your doctor. These medicines may hide a fever. Do not become pregnant while taking this medicine. Women should inform their doctor if they wish to become pregnant or think they might be pregnant. There is  a potential for serious side effects to an unborn child. Talk to your health care professional or pharmacist for more information. Do not breast-feed an infant while taking this medicine. Men should inform their doctor if they wish to father a child. This medicine may lower sperm counts. Do not treat diarrhea with over the counter products. Contact your doctor if you have diarrhea that lasts more than 2 days or if it is  severe and watery. This medicine can make you more sensitive to the sun. Keep out of the sun. If you cannot avoid being in the sun, wear protective clothing and use sunscreen. Do not use sun lamps or tanning beds/booths. What side effects may I notice from receiving this medicine? Side effects that you should report to your doctor or health care professional as soon as possible:  allergic reactions like skin rash, itching or hives, swelling of the face, lips, or tongue  low blood counts - this medicine may decrease the number of white blood cells, red blood cells and platelets. You may be at increased risk for infections and bleeding.  signs of infection - fever or chills, cough, sore throat, pain or difficulty passing urine  signs of decreased platelets or bleeding - bruising, pinpoint red spots on the skin, black, tarry stools, blood in the urine  signs of decreased red blood cells - unusually weak or tired, fainting spells, lightheadedness  breathing problems  changes in vision  chest pain  mouth sores  nausea and vomiting  pain, swelling, redness at site where injected  pain, tingling, numbness in the hands or feet  redness, swelling, or sores on hands or feet  stomach pain  unusual bleeding Side effects that usually do not require medical attention (report to your doctor or health care professional if they continue or are bothersome):  changes in finger or toe nails  diarrhea  dry or itchy skin  hair loss  headache  loss of appetite  sensitivity  of eyes to the light  stomach upset  unusually teary eyes This list may not describe all possible side effects. Call your doctor for medical advice about side effects. You may report side effects to FDA at 1-800-FDA-1088. Where should I keep my medicine? This drug is given in a hospital or clinic and will not be stored at home. NOTE: This sheet is a summary. It may not cover all possible information. If you have questions about this medicine, talk to your doctor, pharmacist, or health care provider.  2021 Elsevier/Gold Standard (2019-02-16 15:00:03)

## 2020-08-10 NOTE — Progress Notes (Signed)
Per Dr. Benay Spice, ok to treat with Scr 3.03.

## 2020-08-11 ENCOUNTER — Telehealth: Payer: Self-pay

## 2020-08-11 NOTE — Telephone Encounter (Signed)
Patient came into clinic today stating his pump kept alarming overnight and that he turned the pump off.  Upon observation, picc line net stocking was bunched up around the catheter site. PICC line flushed with normal saline and blood return noted.  Net stocking readjusted and positioned so that it will not bunch around the catheter site. Pump restarted and patient observed for 10 minutes to ensure pump was working properly. Dr. Benay Spice notified of pump issue.  Per Dr. Benay Spice, ok to remove pump at scheduled time on 08/12/20 and waste remaining chemo in pump. Patient notified of Dr. Gearldine Shown response and reminded of pump stop time for 08/12/20.

## 2020-08-12 ENCOUNTER — Inpatient Hospital Stay: Payer: Medicaid Other

## 2020-08-12 ENCOUNTER — Other Ambulatory Visit: Payer: Self-pay

## 2020-08-12 VITALS — BP 137/93 | HR 75 | Temp 98.6°F | Resp 20

## 2020-08-12 DIAGNOSIS — C20 Malignant neoplasm of rectum: Secondary | ICD-10-CM

## 2020-08-12 DIAGNOSIS — Z5111 Encounter for antineoplastic chemotherapy: Secondary | ICD-10-CM | POA: Diagnosis not present

## 2020-08-12 MED ORDER — HEPARIN SOD (PORK) LOCK FLUSH 100 UNIT/ML IV SOLN
500.0000 [IU] | Freq: Once | INTRAVENOUS | Status: AC | PRN
  Administered 2020-08-12: 500 [IU]
  Filled 2020-08-12: qty 5

## 2020-08-12 MED ORDER — SODIUM CHLORIDE 0.9% FLUSH
10.0000 mL | INTRAVENOUS | Status: DC | PRN
  Administered 2020-08-12: 10 mL
  Filled 2020-08-12: qty 10

## 2020-08-13 ENCOUNTER — Other Ambulatory Visit: Payer: Self-pay

## 2020-08-13 ENCOUNTER — Emergency Department (HOSPITAL_COMMUNITY): Payer: Medicaid Other

## 2020-08-13 ENCOUNTER — Emergency Department (HOSPITAL_COMMUNITY)
Admission: EM | Admit: 2020-08-13 | Discharge: 2020-08-14 | Disposition: A | Discharge status: Home / Self Care | Payer: Medicaid Other | Attending: Emergency Medicine | Admitting: Emergency Medicine

## 2020-08-13 ENCOUNTER — Encounter (HOSPITAL_COMMUNITY): Payer: Self-pay

## 2020-08-13 DIAGNOSIS — Z20822 Contact with and (suspected) exposure to covid-19: Secondary | ICD-10-CM | POA: Diagnosis not present

## 2020-08-13 DIAGNOSIS — R52 Pain, unspecified: Secondary | ICD-10-CM | POA: Insufficient documentation

## 2020-08-13 DIAGNOSIS — R531 Weakness: Secondary | ICD-10-CM | POA: Insufficient documentation

## 2020-08-13 DIAGNOSIS — C20 Malignant neoplasm of rectum: Secondary | ICD-10-CM | POA: Insufficient documentation

## 2020-08-13 DIAGNOSIS — J45909 Unspecified asthma, uncomplicated: Secondary | ICD-10-CM | POA: Diagnosis not present

## 2020-08-13 DIAGNOSIS — Z79899 Other long term (current) drug therapy: Secondary | ICD-10-CM | POA: Insufficient documentation

## 2020-08-13 DIAGNOSIS — I132 Hypertensive heart and chronic kidney disease with heart failure and with stage 5 chronic kidney disease, or end stage renal disease: Secondary | ICD-10-CM | POA: Diagnosis not present

## 2020-08-13 DIAGNOSIS — I509 Heart failure, unspecified: Secondary | ICD-10-CM | POA: Insufficient documentation

## 2020-08-13 DIAGNOSIS — N185 Chronic kidney disease, stage 5: Secondary | ICD-10-CM | POA: Insufficient documentation

## 2020-08-13 DIAGNOSIS — Z7984 Long term (current) use of oral hypoglycemic drugs: Secondary | ICD-10-CM | POA: Diagnosis not present

## 2020-08-13 DIAGNOSIS — Z87891 Personal history of nicotine dependence: Secondary | ICD-10-CM | POA: Diagnosis not present

## 2020-08-13 DIAGNOSIS — E1122 Type 2 diabetes mellitus with diabetic chronic kidney disease: Secondary | ICD-10-CM | POA: Diagnosis not present

## 2020-08-13 LAB — CBC WITH DIFFERENTIAL/PLATELET
Abs Immature Granulocytes: 0.02 10*3/uL (ref 0.00–0.07)
Basophils Absolute: 0 10*3/uL (ref 0.0–0.1)
Basophils Relative: 0 %
Eosinophils Absolute: 0.2 10*3/uL (ref 0.0–0.5)
Eosinophils Relative: 3 %
HCT: 36.5 % — ABNORMAL LOW (ref 39.0–52.0)
Hemoglobin: 11.6 g/dL — ABNORMAL LOW (ref 13.0–17.0)
Immature Granulocytes: 0 %
Lymphocytes Relative: 10 %
Lymphs Abs: 0.6 10*3/uL — ABNORMAL LOW (ref 0.7–4.0)
MCH: 29.1 pg (ref 26.0–34.0)
MCHC: 31.8 g/dL (ref 30.0–36.0)
MCV: 91.5 fL (ref 80.0–100.0)
Monocytes Absolute: 0.3 10*3/uL (ref 0.1–1.0)
Monocytes Relative: 5 %
Neutro Abs: 4.8 10*3/uL (ref 1.7–7.7)
Neutrophils Relative %: 82 %
Platelets: 152 10*3/uL (ref 150–400)
RBC: 3.99 MIL/uL — ABNORMAL LOW (ref 4.22–5.81)
RDW: 18.1 % — ABNORMAL HIGH (ref 11.5–15.5)
WBC: 5.9 10*3/uL (ref 4.0–10.5)
nRBC: 0 % (ref 0.0–0.2)

## 2020-08-13 LAB — COMPREHENSIVE METABOLIC PANEL
ALT: 19 U/L (ref 0–44)
AST: 18 U/L (ref 15–41)
Albumin: 3.3 g/dL — ABNORMAL LOW (ref 3.5–5.0)
Alkaline Phosphatase: 95 U/L (ref 38–126)
Anion gap: 8 (ref 5–15)
BUN: 42 mg/dL — ABNORMAL HIGH (ref 6–20)
CO2: 27 mmol/L (ref 22–32)
Calcium: 8.2 mg/dL — ABNORMAL LOW (ref 8.9–10.3)
Chloride: 101 mmol/L (ref 98–111)
Creatinine, Ser: 2.94 mg/dL — ABNORMAL HIGH (ref 0.61–1.24)
GFR, Estimated: 24 mL/min — ABNORMAL LOW (ref >60)
Glucose, Bld: 200 mg/dL — ABNORMAL HIGH (ref 70–99)
Potassium: 4.4 mmol/L (ref 3.5–5.1)
Sodium: 136 mmol/L (ref 135–145)
Total Bilirubin: 1.3 mg/dL — ABNORMAL HIGH (ref 0.3–1.2)
Total Protein: 7 g/dL (ref 6.5–8.1)

## 2020-08-13 LAB — LACTIC ACID, PLASMA: Lactic Acid, Venous: 1.8 mmol/L (ref 0.5–1.9)

## 2020-08-13 NOTE — ED Triage Notes (Signed)
Patient reports he "just doesn't feel good", states he is currently on chemotherapy, last chemo Saturday, states he is more fatigued, denies fever but states he was having chills

## 2020-08-14 ENCOUNTER — Emergency Department (HOSPITAL_COMMUNITY): Payer: Medicaid Other

## 2020-08-14 LAB — RESP PANEL BY RT-PCR (FLU A&B, COVID) ARPGX2
Influenza A by PCR: NEGATIVE
Influenza B by PCR: NEGATIVE
SARS Coronavirus 2 by RT PCR: NEGATIVE

## 2020-08-14 LAB — URINALYSIS, ROUTINE W REFLEX MICROSCOPIC
Bilirubin Urine: NEGATIVE
Glucose, UA: 50 mg/dL — AB
Hgb urine dipstick: NEGATIVE
Ketones, ur: NEGATIVE mg/dL
Leukocytes,Ua: NEGATIVE
Nitrite: NEGATIVE
Protein, ur: 100 mg/dL — AB
Specific Gravity, Urine: 1.017 (ref 1.005–1.030)
pH: 5 (ref 5.0–8.0)

## 2020-08-14 MED ORDER — ACETAMINOPHEN 500 MG PO TABS
1000.0000 mg | ORAL_TABLET | Freq: Once | ORAL | Status: AC
  Administered 2020-08-14: 1000 mg via ORAL
  Filled 2020-08-14: qty 2

## 2020-08-14 MED ORDER — CEPHALEXIN 500 MG PO CAPS: 500.0000 mg | ORAL_CAPSULE | Freq: Two times a day (BID) | ORAL | 0 refills | Status: DC

## 2020-08-14 NOTE — Discharge Instructions (Addendum)
Your CT scan showed possible inflammation around your bladder.  This could indicate a bladder infection.  I have put you on antibiotics for this.  This could be the cause of your generalized body aches.  The rest of your laboratory work-up tonight was reassuring.  Your COVID and flu tests were negative.  Your CT scan results are listed below.  Please discuss them with your doctors.    IMPRESSION: 1. Mild wall thickening with hazy stranding about the bladder. While these findings could reflect post treatment changes, acute cystitis could also have this appearance. Correlation with urinalysis recommended. 2. Continued interval decrease in perirectal and sigmoid mesocolon lymphadenopathy. No other new or progressive sites of metastatic disease identified. 3. No other acute intra-abdominal or pelvic process. 4. Sigmoid diverticulosis without evidence for acute diverticulitis. 5. 1.6 cm hyperdense right renal lesion, indeterminate, but perhaps mildly increased in size from previous. Follow-up examination with dedicated renal mass protocol CT and/or MRI recommended for further evaluation. This could be performed on a nonemergent outpatient basis.

## 2020-08-14 NOTE — ED Provider Notes (Signed)
[facility] EMERGENCY DEPARTMENT Provider Note   CSN: [id number] Arrival date & time: 08/13/20  2140     History Chief Complaint  Patient presents with  . Generalized Body Aches    Craig West is a 55 y.o. male.  Patient with past medical history notable for rectal cancer, hypertension, diabetes, CKD, CHF, and asthma presents to the emergency department with a chief complaint of generalized weakness.  He states that he began having generalized body aches and weakness today.  He is getting chemotherapy.  He denies measuring a fever, but states that he was having subjective fevers and chills earlier today.  He denies any cough.  Denies any dysuria.  States that he does not have any new abdominal pain or discomfort.  Denies any vomiting or diarrhea.  The history is provided by the patient. No language interpreter was used.       Past Medical History:  Diagnosis Date  . Asthma   . CHF (congestive heart failure) (HCC)   . CKD (chronic kidney disease), stage IV (HCC)   . COVID-19 virus infection 04/2019  . Diabetes mellitus without complication (HCC)   . Gout   . Hypertension   . Rectal cancer (HCC)     Patient Active Problem List   Diagnosis Date Noted  . Diabetes mellitus without complication (HCC)   . Hypertension   . CKD (chronic kidney disease), stage IV (HCC)   . PICC (peripherally inserted central catheter) in place 03/27/2020  . Rectal cancer (HCC) 01/27/2020  . Benign neoplasm of ascending colon   . Benign neoplasm of descending colon   . Benign neoplasm of sigmoid colon   . Rectal mass   . Rectal bleeding   . Iron deficiency anemia   . Acute on chronic congestive heart failure (HCC)   . Hyperkalemia   . Volume overload 01/06/2020  . Acute diastolic heart failure (HCC)   . AKI (acute kidney injury) (HCC)   . Demand ischemia (HCC)   . Hypertensive urgency 12/23/2019  . COVID-19 virus infection 04/2019  . Obesity (BMI 30.0-34.9) 12/09/2017   . Non compliance w medication regimen 04/18/2017  . Gout 08/02/2016  . Diabetic neuropathy (HCC) 08/02/2016  . CKD (chronic kidney disease), stage V (HCC) 08/02/2016  . Hyperlipidemia 10/24/2015  . Asthma 10/16/2015  . Type 2 diabetes mellitus without complication (HCC) 06/13/2014  . Essential hypertension 06/13/2014  . Smoker 06/13/2014    Past Surgical History:  Procedure Laterality Date  . BIOPSY  01/11/2020   Procedure: BIOPSY;  Surgeon: Perry, John N, MD;  Location: [facility] ENDOSCOPY;  Service: Endoscopy;;  . COLONOSCOPY WITH PROPOFOL N/A 01/11/2020   Procedure: COLONOSCOPY WITH PROPOFOL;  Surgeon: Perry, John N, MD;  Location: [facility] ENDOSCOPY;  Service: Endoscopy;  Laterality: N/A;  . NO PAST SURGERIES    . POLYPECTOMY  01/11/2020   Procedure: POLYPECTOMY;  Surgeon: Perry, John N, MD;  Location: [facility] ENDOSCOPY;  Service: Endoscopy;;  . SUBMUCOSAL TATTOO INJECTION  01/11/2020   Procedure: SUBMUCOSAL TATTOO INJECTION;  Surgeon: Perry, John N, MD;  Location: [facility] ENDOSCOPY;  Service: Endoscopy;;       Family History  Problem Relation Age of Onset  . Heart failure Brother     Social History   Tobacco Use  . Smoking status: Former Smoker    Packs/day: 0.50    Types: Cigarettes    Quit date: 10/27/2019    Years since quitting: 0.8  . Smokeless tobacco: Never Used  Vaping Use  .   Vaping Use: Never used  Substance Use Topics  . Alcohol use: Yes    Alcohol/week: 0.0 standard drinks    Comment: occ  . Drug use: No    Home Medications Prior to Admission medications   Medication Sig Start Date End Date Taking? Authorizing Provider  albuterol (VENTOLIN HFA) 108 (90 Base) MCG/ACT inhaler INHALE 2 PUFFS INTO THE LUNGS EVERY SIX HOURS AS NEEDED FOR WHEEZING OR SHORTNESS OF BREATH. 12/27/19 12/26/20  Chen, Joshua Y, MD  allopurinol (ZYLOPRIM) 100 MG tablet Take 200 mg by mouth daily.    [provider]  amLODipine (NORVASC) 5 MG tablet TAKE 2 TABLETS (10 MG TOTAL) BY MOUTH  DAILY. 05/25/20 05/25/21  Newlin, Enobong, MD  atorvastatin (LIPITOR) 40 MG tablet TAKE 1 TABLET (40 MG TOTAL) BY MOUTH DAILY. FOR HYPERCHOLESTEROLEMIA 05/25/20 05/25/21  Newlin, Enobong, MD  Blood Glucose Monitoring Suppl (TRUE METRIX METER) w/Device KIT Check blood sugar fasting and ant bedtime and record 05/15/17   Newlin, Enobong, MD  carvedilol (COREG) 12.5 MG tablet TAKE 1 TABLET (12.5 MG TOTAL) BY MOUTH 2 (TWO) TIMES DAILY WITH A MEAL. 05/25/20 05/25/21  Newlin, Enobong, MD  colchicine 0.6 MG tablet Take 2 tablets (1.2 mg) orally at the onset of a gout attack; may repeat 1 tablet (0.6 mg) in 2 hours if symptoms persist Patient not taking: Reported on 08/09/2020 12/27/19   Chen, Joshua Y, MD  furosemide (LASIX) 20 MG tablet TAKE 3 TABLETS (60 MG TOTAL) BY MOUTH 2 (TWO) TIMES DAILY. 05/25/20 05/25/21  Newlin, Enobong, MD  gabapentin (NEURONTIN) 300 MG capsule TAKE 1 CAPSULE (300 MG TOTAL) BY MOUTH 2 (TWO) TIMES DAILY. 04/05/20 04/05/21  McClung, Angela M, PA-C  glipiZIDE (GLUCOTROL) 5 MG tablet TAKE 0.5 TABLETS (2.5 MG TOTAL) BY MOUTH DAILY BEFORE BREAKFAST. Patient taking differently: Take 5 mg by mouth daily before breakfast. Take 1 tablet daily 05/25/20 05/25/21  Newlin, Enobong, MD  glucose blood (TRUE METRIX BLOOD GLUCOSE TEST) test strip Use as instructed 12/27/19   Chen, Joshua Y, MD  hydrALAZINE (APRESOLINE) 50 MG tablet TAKE 1 TABLET (50 MG TOTAL) BY MOUTH 3 (THREE) TIMES DAILY. 05/25/20 05/25/21  Newlin, Enobong, MD  isosorbide mononitrate (IMDUR) 60 MG 24 hr tablet TAKE 1 TABLET (60 MG TOTAL) BY MOUTH DAILY. 05/25/20 05/25/21  Newlin, Enobong, MD  loperamide (IMODIUM A-D) 2 MG tablet Take 2-4 mg by mouth 4 (four) times daily as needed for diarrhea or loose stools. 08/09/20   [provider]  Misc. Devices (DIGITAL GLASS SCALE) MISC Use scale to weight yourself. Increasing weight may indicate fluid overload 12/27/19   Chen, Joshua Y, MD  tamsulosin (FLOMAX) 0.4 MG CAPS capsule TAKE 1 CAPSULE (0.4 MG  TOTAL) BY MOUTH DAILY. 05/25/20 05/25/21  Newlin, Enobong, MD  TRUEplus Lancets 28G MISC Check blood sugar fasting and at bedtime 12/27/19   Chen, Joshua Y, MD    Allergies    Patient has no known allergies.  Review of Systems   Review of Systems  All other systems reviewed and are negative.   Physical Exam Updated Vital Signs BP (!) 160/107 (BP Location: Left Arm)   Pulse 81   Temp 98.8 F (37.1 C) (Oral)   Resp 19   Ht 6' 1" (1.854 m)   Wt 110.1 kg   SpO2 100%   BMI 32.02 kg/m   Physical Exam Vitals and nursing note reviewed.  Constitutional:      Appearance: He is well-developed.  HENT:     Head: Normocephalic   and atraumatic.  Eyes:     Conjunctiva/sclera: Conjunctivae normal.  Cardiovascular:     Rate and Rhythm: Normal rate and regular rhythm.     Heart sounds: No murmur heard.   Pulmonary:     Effort: Pulmonary effort is normal. No respiratory distress.     Breath sounds: Normal breath sounds.  Abdominal:     Palpations: Abdomen is soft.     Tenderness: There is abdominal tenderness.     Comments: Generalized abdominal discomfort  Musculoskeletal:     Cervical back: Neck supple.  Skin:    General: Skin is warm and dry.  Neurological:     Mental Status: He is alert and oriented to person, place, and time.  Psychiatric:        Mood and Affect: Mood normal.        Behavior: Behavior normal.     ED Results / Procedures / Treatments   Labs (all labs ordered are listed, but only abnormal results are displayed) Labs Reviewed  COMPREHENSIVE METABOLIC PANEL - Abnormal; Notable for the following components:      Result Value   Glucose, Bld 200 (*)    BUN 42 (*)    Creatinine, Ser 2.94 (*)    Calcium 8.2 (*)    Albumin 3.3 (*)    Total Bilirubin 1.3 (*)    GFR, Estimated 24 (*)    All other components within normal limits  CBC WITH DIFFERENTIAL/PLATELET - Abnormal; Notable for the following components:   RBC 3.99 (*)    Hemoglobin 11.6 (*)    HCT 36.5  (*)    RDW 18.1 (*)    Lymphs Abs 0.6 (*)    All other components within normal limits  RESP PANEL BY RT-PCR (FLU A&B, COVID) ARPGX2  LACTIC ACID, PLASMA  URINALYSIS, ROUTINE W REFLEX MICROSCOPIC    EKG None  Radiology No results found.  Procedures Procedures   Medications Ordered in ED Medications  acetaminophen (TYLENOL) tablet 1,000 mg (has no administration in time range)    ED Course  I have reviewed the triage vital signs and the nursing notes.  Pertinent labs & imaging results that were available during my care of the patient were reviewed by me and considered in my medical decision making (see chart for details).  Clinical Course as of 08/14/20 0545  Mon Aug 14, 2020  0120 Lactic Acid, Venous: 1.8 [PC]    Clinical Course User Index [PC] Cardama, Grayce Sessions, MD   MDM Rules/Calculators/A&P                          This patient complains of generalized body aches, this involves an extensive number of treatment options, and is a complaint that carries with it a high risk of complications and morbidity.    Differential Dx Flu, covid, UTI, intraabdominal infection  Pertinent Labs I ordered, reviewed, and interpreted labs, which included CBC with no significant leukocytosis, no significant electrolyte abnormality, Cr is 2.94, which is about baseline, lactate 1.8.  Imaging Interpretation I ordered imaging studies which included CXR.  Chest x-ray showed mild cardiac enlargement with central vascular prominence, no consolidation or overt pulmonary edema.  CT scan of the abdomen showed mild wall thickening with hazy stranding about the bladder.  Decreased interval size of perirectal and sigmoid mesocolon lymphadenopathy.     Medications I ordered medication Tylenol for generalized body aches.  Reassessments After the interventions stated above, I reevaluated the  patient and found resting comfortably.  Vitals:   08/14/20 0415 08/14/20 0500  BP: (!) 134/93  132/88  Pulse: 72 68  Resp: 20 20  Temp:    SpO2: 94% 94%     Consultants none  Plan Discharge with PCP follow-up.  Will treat bladder wall thickening with Keflex.  Urine sent for culture.  Vital signs are stable.  Lactic acid was normal, doubt sepsis or ischemic bowel. Covid and flu were negative.  Laboratory work-up was about baseline for the patient.  I do not feel that any further emergent work-up is indicated, and I feel that he can be safely discharged at this time with outpatient follow-up.    Final Clinical Impression(s) / ED Diagnoses Final diagnoses:  Generalized body aches    Rx / DC Orders ED Discharge Orders    None       , , PA-C 08/14/20 0548    Cardama, Pedro Eduardo, MD 08/14/20 2104  

## 2020-08-15 ENCOUNTER — Other Ambulatory Visit: Payer: Self-pay

## 2020-08-15 ENCOUNTER — Inpatient Hospital Stay: Payer: Medicaid Other

## 2020-08-15 VITALS — BP 140/94 | HR 71 | Temp 98.2°F | Resp 18

## 2020-08-15 DIAGNOSIS — C20 Malignant neoplasm of rectum: Secondary | ICD-10-CM

## 2020-08-15 DIAGNOSIS — Z5111 Encounter for antineoplastic chemotherapy: Secondary | ICD-10-CM | POA: Diagnosis not present

## 2020-08-15 DIAGNOSIS — Z452 Encounter for adjustment and management of vascular access device: Secondary | ICD-10-CM

## 2020-08-15 MED ORDER — SODIUM CHLORIDE 0.9% FLUSH
10.0000 mL | Freq: Once | INTRAVENOUS | Status: AC
  Administered 2020-08-15: 10 mL
  Filled 2020-08-15: qty 10

## 2020-08-15 MED ORDER — HEPARIN SOD (PORK) LOCK FLUSH 100 UNIT/ML IV SOLN
250.0000 [IU] | Freq: Once | INTRAVENOUS | Status: AC
Start: 2020-08-15 — End: 2020-08-15
  Administered 2020-08-15: 250 [IU]
  Filled 2020-08-15: qty 5

## 2020-08-15 MED ORDER — HEPARIN SOD (PORK) LOCK FLUSH 100 UNIT/ML IV SOLN
250.0000 [IU] | Freq: Once | INTRAVENOUS | Status: DC
  Filled 2020-08-15: qty 5

## 2020-08-15 MED ORDER — SODIUM CHLORIDE 0.9% FLUSH
10.0000 mL | Freq: Once | INTRAVENOUS | Status: DC
  Filled 2020-08-15: qty 10

## 2020-08-16 LAB — URINE CULTURE: Culture: NO GROWTH

## 2020-08-17 ENCOUNTER — Other Ambulatory Visit: Payer: Self-pay

## 2020-08-17 ENCOUNTER — Inpatient Hospital Stay: Payer: Medicaid Other

## 2020-08-17 VITALS — BP 160/105 | Resp 20

## 2020-08-17 DIAGNOSIS — Z452 Encounter for adjustment and management of vascular access device: Secondary | ICD-10-CM

## 2020-08-17 DIAGNOSIS — Z5111 Encounter for antineoplastic chemotherapy: Secondary | ICD-10-CM | POA: Diagnosis not present

## 2020-08-17 DIAGNOSIS — C20 Malignant neoplasm of rectum: Secondary | ICD-10-CM

## 2020-08-17 MED ORDER — HEPARIN SOD (PORK) LOCK FLUSH 100 UNIT/ML IV SOLN
500.0000 [IU] | Freq: Once | INTRAVENOUS | Status: AC
  Administered 2020-08-17: 250 [IU]
  Filled 2020-08-17: qty 5

## 2020-08-17 MED ORDER — HEPARIN SOD (PORK) LOCK FLUSH 100 UNIT/ML IV SOLN
500.0000 [IU] | Freq: Once | INTRAVENOUS | Status: AC
Start: 2020-08-17 — End: 2020-08-17
  Administered 2020-08-17: 250 [IU]
  Filled 2020-08-17: qty 5

## 2020-08-17 MED ORDER — SODIUM CHLORIDE 0.9% FLUSH
10.0000 mL | Freq: Once | INTRAVENOUS | Status: AC
  Administered 2020-08-17: 10 mL
  Filled 2020-08-17: qty 10

## 2020-08-17 NOTE — Patient Instructions (Signed)
[facility]  Discharge Instructions: °Thank you for choosing [facility] to provide your oncology and hematology care.  ° °If you have a lab appointment with the Cancer Center, please go directly to the Cancer Center and check in at the registration area. °  °Wear comfortable clothing and clothing appropriate for easy access to any Portacath or PICC line.  ° °We strive to give you quality time with your provider. You may need to reschedule your appointment if you arrive late (15 or more minutes).  Arriving late affects you and other patients whose appointments are after yours.  Also, if you miss three or more appointments without notifying the office, you may be dismissed from the clinic at the provider’s discretion.    °  °For prescription refill requests, have your pharmacy contact our office and allow 72 hours for refills to be completed.   ° °  °To help prevent nausea and vomiting after your treatment, we encourage you to take your nausea medication as directed. ° °BELOW ARE SYMPTOMS THAT SHOULD BE REPORTED IMMEDIATELY: °*FEVER GREATER THAN 100.4 F (38 °C) OR HIGHER °*CHILLS OR SWEATING °*NAUSEA AND VOMITING THAT IS NOT CONTROLLED WITH YOUR NAUSEA MEDICATION °*UNUSUAL SHORTNESS OF BREATH °*UNUSUAL BRUISING OR BLEEDING °*URINARY PROBLEMS (pain or burning when urinating, or frequent urination) °*BOWEL PROBLEMS (unusual diarrhea, constipation, pain near the anus) °TENDERNESS IN MOUTH AND THROAT WITH OR WITHOUT PRESENCE OF ULCERS (sore throat, sores in mouth, or a toothache) °UNUSUAL RASH, SWELLING OR PAIN  °UNUSUAL VAGINAL DISCHARGE OR ITCHING  ° °Items with * indicate a potential emergency and should be followed up as soon as possible or go to the Emergency Department if any problems should occur. ° °Please show the CHEMOTHERAPY ALERT CARD or IMMUNOTHERAPY ALERT CARD at check-in to the Emergency Department and triage nurse. ° °Should you have questions after your visit or  need to cancel or reschedule your appointment, please contact [facility]  Dept: [phone]  and follow the prompts.  Office hours are 8:00 a.m. to 4:30 p.m. Monday - Friday. Please note that voicemails left after 4:00 p.m. may not be returned until the following business day.  We are closed weekends and major holidays. You have access to a nurse at all times for urgent questions. Please call the main number to the clinic Dept: [phone] and follow the prompts. ° ° °For any non-urgent questions, you may also contact your provider using MyChart. We now offer e-Visits for anyone 18 and older to request care online for non-urgent symptoms. For details visit [email or web address]. °  °Also download the MyChart app! Go to the app store, search "MyChart", open the app, select , and log in with your MyChart username and password. ° °Due to Covid, a mask is required upon entering the hospital/clinic. If you do not have a mask, one will be given to you upon arrival. For doctor visits, patients may have 1 support person aged 18 or older with them. For treatment visits, patients cannot have anyone with them due to current Covid guidelines and our immunocompromised population.  ° °

## 2020-08-20 ENCOUNTER — Other Ambulatory Visit: Payer: Self-pay | Admitting: Oncology

## 2020-08-22 ENCOUNTER — Inpatient Hospital Stay: Payer: Medicaid Other

## 2020-08-22 ENCOUNTER — Other Ambulatory Visit: Payer: Self-pay

## 2020-08-22 VITALS — BP 111/73 | HR 65 | Temp 98.0°F | Resp 18

## 2020-08-22 DIAGNOSIS — Z452 Encounter for adjustment and management of vascular access device: Secondary | ICD-10-CM

## 2020-08-22 DIAGNOSIS — C20 Malignant neoplasm of rectum: Secondary | ICD-10-CM

## 2020-08-22 DIAGNOSIS — Z5111 Encounter for antineoplastic chemotherapy: Secondary | ICD-10-CM | POA: Diagnosis not present

## 2020-08-22 MED ORDER — SODIUM CHLORIDE 0.9% FLUSH
10.0000 mL | Freq: Once | INTRAVENOUS | Status: AC
  Administered 2020-08-22: 10 mL
  Filled 2020-08-22: qty 10

## 2020-08-22 MED ORDER — HEPARIN SOD (PORK) LOCK FLUSH 100 UNIT/ML IV SOLN
500.0000 [IU] | Freq: Once | INTRAVENOUS | Status: DC
  Filled 2020-08-22: qty 5

## 2020-08-22 MED ORDER — HEPARIN SOD (PORK) LOCK FLUSH 100 UNIT/ML IV SOLN
250.0000 [IU] | Freq: Once | INTRAVENOUS | Status: AC
  Administered 2020-08-22: 250 [IU]
  Filled 2020-08-22: qty 5

## 2020-08-22 MED ORDER — HEPARIN SOD (PORK) LOCK FLUSH 100 UNIT/ML IV SOLN
250.0000 [IU] | Freq: Once | INTRAVENOUS | Status: AC
Start: 2020-08-22 — End: 2020-08-22
  Administered 2020-08-22: 250 [IU]
  Filled 2020-08-22: qty 5

## 2020-08-22 NOTE — Patient Instructions (Signed)
PICC Home Care Guide A peripherally inserted central catheter (PICC) is a form of IV access that allows medicines and IV fluids to be quickly distributed throughout the body. The PICC is a long, thin, flexible tube (catheter) that is inserted into a vein in the upper arm. The catheter ends in a large vein in the chest (superior vena cava, or SVC). After the PICC is inserted, a chest X-ray may be done to make sure that it is in the correct place. A PICC may be placed for different reasons, such as:  To give medicines and liquid nutrition.  To give IV fluids and blood products.  If there is trouble placing a peripheral intravenous (PIV) catheter. If taken care of properly, a PICC can remain in place for several months. Having a PICC can also allow a person to go home from the hospital sooner. Medicine and PICC care can be managed at home by a family member, caregiver, or home health care team. What are the risks? Generally, having a PICC is safe. However, problems may occur, including:  A blood clot (thrombus) forming in or at the tip of the PICC.  A blood clot forming in a vein (deep vein thrombosis) or traveling to the lung (pulmonary embolism).  Inflammation of the vein (phlebitis) in which the PICC is placed.  Infection. Central line associated blood stream infection (CLABSI) is a serious infection that often requires hospitalization.  PICC movement (malposition). The PICC tip may move from its original position due to excessive physical activity, forceful coughing, sneezing, or vomiting.  A break or cut in the PICC. It is important not to use scissors near the PICC.  Nerve or tendon irritation or injury during PICC insertion. How to take care of your PICC Preventing problems  You and any caregivers should wash your hands often with soap. Wash hands: ? Before touching the PICC line or the infusion device. ? Before changing a bandage (dressing).  Flush the PICC as told by your  health care provider. Let your health care provider know right away if the PICC is hard to flush or does not flush. Do not use force to flush the PICC.  Do not use a syringe that is less than 10 mL to flush the PICC.  Avoid blood pressure checks on the arm in which the PICC is placed.  Never pull or tug on the PICC.  Do not take the PICC out yourself. Only a trained clinical professional should remove the PICC.  Use clean and sterile supplies only. Keep the supplies in a dry place. Do not reuse needles, syringes, or any other supplies. Doing that can lead to infection.  Keep pets and children away from your PICC line.  Check the PICC insertion site every day for signs of infection. Check for: ? Leakage. ? Redness, swelling, or pain. ? Fluid or blood. ? Warmth. ? Pus or a bad smell. PICC dressing care  Keep your PICC bandage (dressing) clean and dry to prevent infection.  Do not take baths, swim, or use a hot tub until your health care provider approves. Ask your health care provider if you can take showers. You may only be allowed to take sponge baths for bathing. When you are allowed to shower: ? Ask your health care provider to teach you how to wrap the PICC line. ? Cover the PICC line with clear plastic wrap and tape to keep it dry while showering.  Follow instructions from your health care provider about   how to take care of your insertion site and dressing. Make sure you: ? Wash your hands with soap and water before you change your bandage (dressing). If soap and water are not available, use hand sanitizer. ? Change your dressing as told by your health care provider. ? Leave stitches (sutures), skin glue, or adhesive strips in place. These skin closures may need to stay in place for 2 weeks or longer. If adhesive strip edges start to loosen and curl up, you may trim the loose edges. Do not remove adhesive strips completely unless your health care provider tells you to do  that.  Change your PICC dressing if it becomes loose or wet. General instructions  Carry your PICC identification card or wear a medical alert bracelet at all times.  Keep the tube clamped at all times, unless it is being used.  Carry a smooth-edge clamp with you at all times to place on the tube if it breaks.  Do not use scissors or sharp objects near the tube.  You may bend your arm and move it freely. If your PICC is near or at the bend of your elbow, avoid activity with repeated motion at the elbow.  Avoid lifting heavy objects as told by your health care provider.  Keep all follow-up visits as told by your health care provider. This is important.   Disposal of supplies  Throw away any syringes in a disposal container that is meant for sharp items (sharps container). You can buy a sharps container from a pharmacy, or you can make one by using an empty hard plastic bottle with a cover.  Place any used dressings or infusion bags into a plastic bag. Throw that bag in the trash. Contact a health care provider if:  You have pain in your arm, ear, face, or teeth.  You have a fever or chills.  You have redness, swelling, or pain around the insertion site.  You have fluid or blood coming from the insertion site.  Your insertion site feels warm to the touch.  You have pus or a bad smell coming from the insertion site.  Your skin feels hard and raised around the insertion site. Get help right away if:  Your PICC is accidentally pulled all the way out. If this happens, cover the insertion site with a bandage or gauze dressing. Do not throw the PICC away. Your health care provider will need to check it.  Your PICC was tugged or pulled and has partially come out. Do not  push the PICC back in.  You cannot flush the PICC, it is hard to flush, or the PICC leaks around the insertion site when it is flushed.  You hear a "flushing" sound when the PICC is flushed.  You feel your  heart racing or skipping beats.  There is a hole or tear in the PICC.  You have swelling in the arm in which the PICC was inserted.  You have a red streak going up your arm from where the PICC was inserted. Summary  A peripherally inserted central catheter (PICC) is a long, thin, flexible tube (catheter) that is inserted into a vein in the upper arm.  The PICC is inserted using a sterile technique by a specially trained nurse or physician. Only a trained clinical professional should remove it.  Keep your PICC identification card with you at all times.  Avoid blood pressure checks on the arm in which the PICC is placed.  If cared for   properly, a PICC can remain in place for several months. Having a PICC can also allow a person to go home from the hospital sooner. This information is not intended to replace advice given to you by your health care provider. Make sure you discuss any questions you have with your health care provider. Document Revised: 07/28/2019 Document Reviewed: 07/28/2019 Elsevier Patient Education  2021 [employer]  

## 2020-08-22 NOTE — Progress Notes (Signed)
Patient arrived to infusion room to have PICC line flushed.  Dressing was falling off and was wet.  Patient asked if he has been taking showers and getting bandage wet.  Patient stated he had.  Patient educated on importance of keeping site dry and to cover site with saran wrap, press and seal wrap, or a bag when taking a shower.  Patient verbalized understanding.  Dressing was changed and intact upon patient leaving infusion room.

## 2020-08-24 ENCOUNTER — Inpatient Hospital Stay: Payer: Medicaid Other

## 2020-08-24 ENCOUNTER — Encounter: Payer: Self-pay | Admitting: Nurse Practitioner

## 2020-08-24 ENCOUNTER — Inpatient Hospital Stay (HOSPITAL_BASED_OUTPATIENT_CLINIC_OR_DEPARTMENT_OTHER): Payer: Medicaid Other | Admitting: Nurse Practitioner

## 2020-08-24 ENCOUNTER — Other Ambulatory Visit: Payer: Self-pay

## 2020-08-24 VITALS — BP 117/81 | HR 72 | Temp 98.0°F | Resp 18 | Ht 73.0 in | Wt 235.2 lb

## 2020-08-24 DIAGNOSIS — C20 Malignant neoplasm of rectum: Secondary | ICD-10-CM

## 2020-08-24 DIAGNOSIS — Z5111 Encounter for antineoplastic chemotherapy: Secondary | ICD-10-CM | POA: Diagnosis not present

## 2020-08-24 LAB — CBC WITH DIFFERENTIAL (CANCER CENTER ONLY)
Abs Immature Granulocytes: 0.03 10*3/uL (ref 0.00–0.07)
Basophils Absolute: 0 10*3/uL (ref 0.0–0.1)
Basophils Relative: 1 %
Eosinophils Absolute: 0.2 10*3/uL (ref 0.0–0.5)
Eosinophils Relative: 2 %
HCT: 34.5 % — ABNORMAL LOW (ref 39.0–52.0)
Hemoglobin: 10.8 g/dL — ABNORMAL LOW (ref 13.0–17.0)
Immature Granulocytes: 0 %
Lymphocytes Relative: 10 %
Lymphs Abs: 0.7 10*3/uL (ref 0.7–4.0)
MCH: 28.1 pg (ref 26.0–34.0)
MCHC: 31.3 g/dL (ref 30.0–36.0)
MCV: 89.6 fL (ref 80.0–100.0)
Monocytes Absolute: 0.9 10*3/uL (ref 0.1–1.0)
Monocytes Relative: 11 %
Neutro Abs: 5.8 10*3/uL (ref 1.7–7.7)
Neutrophils Relative %: 76 %
Platelet Count: 160 10*3/uL (ref 150–400)
RBC: 3.85 MIL/uL — ABNORMAL LOW (ref 4.22–5.81)
RDW: 18.9 % — ABNORMAL HIGH (ref 11.5–15.5)
WBC Count: 7.6 10*3/uL (ref 4.0–10.5)
nRBC: 0 % (ref 0.0–0.2)

## 2020-08-24 LAB — CMP (CANCER CENTER ONLY)
ALT: 10 U/L (ref 0–44)
AST: 14 U/L — ABNORMAL LOW (ref 15–41)
Albumin: 3.6 g/dL (ref 3.5–5.0)
Alkaline Phosphatase: 110 U/L (ref 38–126)
Anion gap: 10 (ref 5–15)
BUN: 36 mg/dL — ABNORMAL HIGH (ref 6–20)
CO2: 22 mmol/L (ref 22–32)
Calcium: 8 mg/dL — ABNORMAL LOW (ref 8.9–10.3)
Chloride: 109 mmol/L (ref 98–111)
Creatinine: 2.98 mg/dL — ABNORMAL HIGH (ref 0.61–1.24)
GFR, Estimated: 24 mL/min — ABNORMAL LOW (ref >60)
Glucose, Bld: 101 mg/dL — ABNORMAL HIGH (ref 70–99)
Potassium: 4 mmol/L (ref 3.5–5.1)
Sodium: 141 mmol/L (ref 135–145)
Total Bilirubin: 0.4 mg/dL (ref 0.3–1.2)
Total Protein: 6.8 g/dL (ref 6.5–8.1)

## 2020-08-24 MED ORDER — SODIUM CHLORIDE 0.9 % IV SOLN
10.0000 mg | Freq: Once | INTRAVENOUS | Status: AC
  Administered 2020-08-24: 10 mg via INTRAVENOUS
  Filled 2020-08-24: qty 1

## 2020-08-24 MED ORDER — SODIUM CHLORIDE 0.9 % IV SOLN
5000.0000 mg | INTRAVENOUS | Status: DC
  Administered 2020-08-24: 5000 mg via INTRAVENOUS
  Filled 2020-08-24: qty 100

## 2020-08-24 MED ORDER — LEUCOVORIN CALCIUM INJECTION 350 MG
300.0000 mg/m2 | Freq: Once | INTRAVENOUS | Status: AC
  Administered 2020-08-24: 718 mg via INTRAVENOUS
  Filled 2020-08-24: qty 35.9

## 2020-08-24 MED ORDER — SODIUM CHLORIDE 0.9 % IV SOLN: 2000.0000 mg/m2 | INTRAVENOUS | Status: DC

## 2020-08-24 MED ORDER — DEXTROSE 5 % IV SOLN
Freq: Once | INTRAVENOUS | Status: AC
  Filled 2020-08-24: qty 250

## 2020-08-24 MED ORDER — PALONOSETRON HCL INJECTION 0.25 MG/5ML
0.2500 mg | Freq: Once | INTRAVENOUS | Status: AC
  Administered 2020-08-24: 0.25 mg via INTRAVENOUS
  Filled 2020-08-24: qty 5

## 2020-08-24 MED ORDER — FLUOROURACIL CHEMO INJECTION 2.5 GM/50ML
300.0000 mg/m2 | Freq: Once | INTRAVENOUS | Status: AC
  Administered 2020-08-24: 700 mg via INTRAVENOUS
  Filled 2020-08-24: qty 14

## 2020-08-24 MED ORDER — OXALIPLATIN CHEMO INJECTION 100 MG/20ML
65.0000 mg/m2 | Freq: Once | INTRAVENOUS | Status: AC
  Administered 2020-08-24: 155 mg via INTRAVENOUS
  Filled 2020-08-24: qty 31

## 2020-08-24 NOTE — Progress Notes (Signed)
  [facility] OFFICE PROGRESS NOTE   Diagnosis: Rectal cancer  INTERVAL HISTORY:   Craig West returns as scheduled.  He completed cycle 5 FOLFOX 08/09/2020.  He was seen in the emergency department 08/14/2020 with generalized body aches.  Evaluation was unremarkable.  He was noted to have bladder wall thickening on CT.  He was discharged home on Keflex.  Urine culture was without growth.  He denies nausea/vomiting.  No mouth sores.  He has periodic loose stools.  The loose stools tend to occur at nighttime after eating.  He has not tried Imodium.  He reports cold sensitivity for 12 to 10 days.  No persistent neuropathy symptoms.  Objective:  Vital signs in last 24 hours:  Blood pressure 117/81, pulse 72, temperature 98 F (36.7 C), temperature source Oral, resp. rate 18, height 6\' 1"  (1.854 m), weight 235 lb 3.2 oz (106.7 kg), SpO2 99 %.    HEENT: Mild white coating over tongue. Resp: Lungs clear bilaterally. Cardio: Regular rate and rhythm. GI: Abdomen soft and nontender.  No hepatomegaly. Vascular: No leg edema. Neuro: Vibratory sense intact over the fingertips per tuning fork exam. Skin: Palms without erythema. Right upper extremity PICC site without erythema.   Lab Results:  Lab Results  Component Value Date   WBC 7.6 08/24/2020   HGB 10.8 (L) 08/24/2020   HCT 34.5 (L) 08/24/2020   MCV 89.6 08/24/2020   PLT 160 08/24/2020   NEUTROABS 5.8 08/24/2020    Imaging:  No results found.  Medications: I have reviewed the patient's current medications.  Assessment/Plan: 1. Rectal cancer-rectal mass at 5-6 cm from the anal verge on colonoscopy 01/11/2020, biopsy confirmed adenocarcinoma ? CTs 01/11/2020-rectal thickening, small perirectal lymph nodes and external iliac nodes, moderate right pleural effusion ? MRI pelvis 01/27/2020-[id number] tumor at 7.1 cm from the internal anal sphincter, multiple mesorectal nodes, indistinct left pelvic sidewall node and  enlarged bilateral external iliac nodes ? PET scan 02/11/2020-hypermetabolic rectosigmoid mass, hypermetabolic mesorectal and sigmoid mesocolon lymph nodes, left external iliac/obturator node with mild hypermetabolism, 18 mm left inguinal node with normal architecture and slight hypermetabolism ? Radiation and infusional 5-FU 02/21/2020-04/04/2020 ? CTs 05/09/2020-decreased perirectal and sigmoid mesocolon lymphadenopathy, no evidence of disease progression ? MRI pelvis 05/17/2020-no well-defined residual rectal mass seen. Wall appears slightly irregular. No perirectal extension of tumor. Again identified is perirectal adenopathy, similar to most recent CT 05/10/2020 but improved compared to PET/CT of 02/11/2020. ? Cycle 1 FOLFOX 06/14/2020 ? 06/27/2020 chemotherapy held due to hypertension ? Cycle 2 FOLFOX 06/29/2020 ? Cycle 3 FOLFOX 07/13/2020 ? Cycle 4 FOLFOX 07/27/2020 ? Cycle 5 FOLFOX 08/09/2020 ? Cycle 6 FOLFOX 08/24/2020 2. Anemia 3. Chronic renal failure 4. Congestive heart failure-severe LVH 5. Diabetes 6. Gout 7. Peripheral neuropathy? 8. Hypertension   Disposition: Craig West appears stable.  He has completed 5 cycles of neoadjuvant FOLFOX.  Plan to proceed with cycle 6 today as scheduled.  We reviewed the CBC from today.  Counts adequate to proceed with treatment.  He will return for lab, follow-up, cycle 7 FOLFOX in 2 weeks.  He will contact the office in the interim with any problems.    [name] ANP/GNP-BC   08/24/2020  9:07 AM

## 2020-08-24 NOTE — Patient Instructions (Signed)
[facility]  Discharge Instructions: Thank you for choosing [facility] to provide your oncology and hematology care.   If you have a lab appointment with the [facility], please go directly to the [facility] and check in at the registration area.   Wear comfortable clothing and clothing appropriate for easy access to any Portacath or PICC line.   We strive to give you quality time with your provider. You may need to reschedule your appointment if you arrive late (15 or more minutes).  Arriving late affects you and other patients whose appointments are after yours.  Also, if you miss three or more appointments without notifying the office, you may be dismissed from the clinic at the provider's discretion.      For prescription refill requests, have your pharmacy contact our office and allow 72 hours for refills to be completed.    Today you received the following chemotherapy and/or immunotherapy agents FOLFOX   To help prevent nausea and vomiting after your treatment, we encourage you to take your nausea medication as directed.  BELOW ARE SYMPTOMS THAT SHOULD BE REPORTED IMMEDIATELY: . *FEVER GREATER THAN 100.4 F (38 C) OR HIGHER . *CHILLS OR SWEATING . *NAUSEA AND VOMITING THAT IS NOT CONTROLLED WITH YOUR NAUSEA MEDICATION . *UNUSUAL SHORTNESS OF BREATH . *UNUSUAL BRUISING OR BLEEDING . *URINARY PROBLEMS (pain or burning when urinating, or frequent urination) . *BOWEL PROBLEMS (unusual diarrhea, constipation, pain near the anus) . TENDERNESS IN MOUTH AND THROAT WITH OR WITHOUT PRESENCE OF ULCERS (sore throat, sores in mouth, or a toothache) . UNUSUAL RASH, SWELLING OR PAIN  . UNUSUAL VAGINAL DISCHARGE OR ITCHING   Items with * indicate a potential emergency and should be followed up as soon as possible or go to the Emergency Department if any problems should occur.  Please show the CHEMOTHERAPY ALERT CARD or IMMUNOTHERAPY ALERT CARD at  check-in to the Emergency Department and triage nurse.  Should you have questions after your visit or need to cancel or reschedule your appointment, please contact [facility]  Dept: [phone]  and follow the prompts.  Office hours are 8:00 a.m. to 4:30 p.m. Monday - Friday. Please note that voicemails left after 4:00 p.m. may not be returned until the following business day.  We are closed weekends and major holidays. You have access to a nurse at all times for urgent questions. Please call the main number to the clinic Dept: [phone] and follow the prompts.   For any non-urgent questions, you may also contact your provider using MyChart. We now offer e-Visits for anyone 69 and older to request care online for non-urgent symptoms. For details visit [email or web address].   Also download the MyChart app! Go to the app store, search "MyChart", open the app, select [facility], and log in with your MyChart username and password.  Due to Covid, a mask is required upon entering the hospital/clinic. If you do not have a mask, one will be given to you upon arrival. For doctor visits, patients may have 1 support person aged 39 or older with them. For treatment visits, patients cannot have anyone with them due to current Covid guidelines and our immunocompromised population.   Oxaliplatin Injection What is this medicine? OXALIPLATIN (ox AL i PLA tin) is a chemotherapy drug. It targets fast dividing cells, like cancer cells, and causes these cells to die. This medicine is used to treat cancers of the colon and rectum,  and many other cancers. This medicine may be used for other purposes; ask your health care provider or pharmacist if you have questions. COMMON BRAND NAME(S): Eloxatin What should I tell my health care provider before I take this medicine? They need to know if you have any of these conditions:  heart disease  history of irregular heartbeat  liver  disease  low blood counts, like white cells, platelets, or red blood cells  lung or breathing disease, like asthma  take medicines that treat or prevent blood clots  tingling of the fingers or toes, or other nerve disorder  an unusual or allergic reaction to oxaliplatin, other chemotherapy, other medicines, foods, dyes, or preservatives  pregnant or trying to get pregnant  breast-feeding How should I use this medicine? This drug is given as an infusion into a vein. It is administered in a hospital or clinic by a specially trained health care professional. Talk to your pediatrician regarding the use of this medicine in children. Special care may be needed. Overdosage: If you think you have taken too much of this medicine contact a poison control center or emergency room at once. NOTE: This medicine is only for you. Do not share this medicine with others. What if I miss a dose? It is important not to miss a dose. Call your doctor or health care professional if you are unable to keep an appointment. What may interact with this medicine? Do not take this medicine with any of the following medications:  cisapride  dronedarone  pimozide  thioridazine This medicine may also interact with the following medications:  aspirin and aspirin-like medicines  certain medicines that treat or prevent blood clots like warfarin, apixaban, dabigatran, and rivaroxaban  cisplatin  cyclosporine  diuretics  medicines for infection like acyclovir, adefovir, amphotericin B, bacitracin, cidofovir, foscarnet, ganciclovir, gentamicin, pentamidine, vancomycin  NSAIDs, medicines for pain and inflammation, like ibuprofen or naproxen  other medicines that prolong the QT interval (an abnormal heart rhythm)  pamidronate  zoledronic acid This list may not describe all possible interactions. Give your health care provider a list of all the medicines, herbs, non-prescription drugs, or dietary  supplements you use. Also tell them if you smoke, drink alcohol, or use illegal drugs. Some items may interact with your medicine. What should I watch for while using this medicine? Your condition will be monitored carefully while you are receiving this medicine. You may need blood work done while you are taking this medicine. This medicine may make you feel generally unwell. This is not uncommon as chemotherapy can affect healthy cells as well as cancer cells. Report any side effects. Continue your course of treatment even though you feel ill unless your healthcare professional tells you to stop. This medicine can make you more sensitive to cold. Do not drink cold drinks or use ice. Cover exposed skin before coming in contact with cold temperatures or cold objects. When out in cold weather wear warm clothing and cover your mouth and nose to warm the air that goes into your lungs. Tell your doctor if you get sensitive to the cold. Do not become pregnant while taking this medicine or for 9 months after stopping it. Women should inform their health care professional if they wish to become pregnant or think they might be pregnant. Men should not father a child while taking this medicine and for 6 months after stopping it. There is potential for serious side effects to an unborn child. Talk to your health care professional  for more information. Do not breast-feed a child while taking this medicine or for 3 months after stopping it. This medicine has caused ovarian failure in some women. This medicine may make it more difficult to get pregnant. Talk to your health care professional if you are concerned about your fertility. This medicine has caused decreased sperm counts in some men. This may make it more difficult to father a child. Talk to your health care professional if you are concerned about your fertility. This medicine may increase your risk of getting an infection. Call your health care professional  for advice if you get a fever, chills, or sore throat, or other symptoms of a cold or flu. Do not treat yourself. Try to avoid being around people who are sick. Avoid taking medicines that contain aspirin, acetaminophen, ibuprofen, naproxen, or ketoprofen unless instructed by your health care professional. These medicines may hide a fever. Be careful brushing or flossing your teeth or using a toothpick because you may get an infection or bleed more easily. If you have any dental work done, tell your dentist you are receiving this medicine. What side effects may I notice from receiving this medicine? Side effects that you should report to your doctor or health care professional as soon as possible:  allergic reactions like skin rash, itching or hives, swelling of the face, lips, or tongue  breathing problems  cough  low blood counts - this medicine may decrease the number of white blood cells, red blood cells, and platelets. You may be at increased risk for infections and bleeding  nausea, vomiting  pain, redness, or irritation at site where injected  pain, tingling, numbness in the hands or feet  signs and symptoms of bleeding such as bloody or black, tarry stools; red or dark brown urine; spitting up blood or brown material that looks like coffee grounds; red spots on the skin; unusual bruising or bleeding from the eyes, gums, or nose  signs and symptoms of a dangerous change in heartbeat or heart rhythm like chest pain; dizziness; fast, irregular heartbeat; palpitations; feeling faint or lightheaded; falls  signs and symptoms of infection like fever; chills; cough; sore throat; pain or trouble passing urine  signs and symptoms of liver injury like dark yellow or brown urine; general ill feeling or flu-like symptoms; light-colored stools; loss of appetite; nausea; right upper belly pain; unusually weak or tired; yellowing of the eyes or skin  signs and symptoms of low red blood cells or  anemia such as unusually weak or tired; feeling faint or lightheaded; falls  signs and symptoms of muscle injury like dark urine; trouble passing urine or change in the amount of urine; unusually weak or tired; muscle pain; back pain Side effects that usually do not require medical attention (report to your doctor or health care professional if they continue or are bothersome):  changes in taste  diarrhea  gas  hair loss  loss of appetite  mouth sores This list may not describe all possible side effects. Call your doctor for medical advice about side effects. You may report side effects to FDA at 1-800-FDA-1088. Where should I keep my medicine? This drug is given in a hospital or clinic and will not be stored at home. NOTE: This sheet is a summary. It may not cover all possible information. If you have questions about this medicine, talk to your doctor, pharmacist, or health care provider.  2021 Elsevier/Gold Standard (2018-08-05 12:20:35)  Leucovorin injection What is this medicine?  LEUCOVORIN (loo koe VOR in) is used to prevent or treat the harmful effects of some medicines. This medicine is used to treat anemia caused by a low amount of folic acid in the body. It is also used with 5-fluorouracil (5-FU) to treat colon cancer. This medicine may be used for other purposes; ask your health care provider or pharmacist if you have questions. What should I tell my health care provider before I take this medicine? They need to know if you have any of these conditions:  anemia from low levels of vitamin B-12 in the blood  an unusual or allergic reaction to leucovorin, folic acid, other medicines, foods, dyes, or preservatives  pregnant or trying to get pregnant  breast-feeding How should I use this medicine? This medicine is for injection into a muscle or into a vein. It is given by a health care professional in a hospital or clinic setting. Talk to your pediatrician regarding the  use of this medicine in children. Special care may be needed. Overdosage: If you think you have taken too much of this medicine contact a poison control center or emergency room at once. NOTE: This medicine is only for you. Do not share this medicine with others. What if I miss a dose? This does not apply. What may interact with this medicine?  capecitabine  fluorouracil  phenobarbital  phenytoin  primidone  trimethoprim-sulfamethoxazole This list may not describe all possible interactions. Give your health care provider a list of all the medicines, herbs, non-prescription drugs, or dietary supplements you use. Also tell them if you smoke, drink alcohol, or use illegal drugs. Some items may interact with your medicine. What should I watch for while using this medicine? Your condition will be monitored carefully while you are receiving this medicine. This medicine may increase the side effects of 5-fluorouracil, 5-FU. Tell your doctor or health care professional if you have diarrhea or mouth sores that do not get better or that get worse. What side effects may I notice from receiving this medicine? Side effects that you should report to your doctor or health care professional as soon as possible:  allergic reactions like skin rash, itching or hives, swelling of the face, lips, or tongue  breathing problems  fever, infection  mouth sores  unusual bleeding or bruising  unusually weak or tired Side effects that usually do not require medical attention (report to your doctor or health care professional if they continue or are bothersome):  constipation or diarrhea  loss of appetite  nausea, vomiting This list may not describe all possible side effects. Call your doctor for medical advice about side effects. You may report side effects to FDA at 1-800-FDA-1088. Where should I keep my medicine? This drug is given in a hospital or clinic and will not be stored at home. NOTE:  This sheet is a summary. It may not cover all possible information. If you have questions about this medicine, talk to your doctor, pharmacist, or health care provider.  2021 Elsevier/Gold Standard (2007-09-22 16:50:29)  Fluorouracil, 5-FU injection What is this medicine? FLUOROURACIL, 5-FU (flure oh YOOR a sil) is a chemotherapy drug. It slows the growth of cancer cells. This medicine is used to treat many types of cancer like breast cancer, colon or rectal cancer, pancreatic cancer, and stomach cancer. This medicine may be used for other purposes; ask your health care provider or pharmacist if you have questions. COMMON BRAND NAME(S): Adrucil What should I tell my health care provider before  I take this medicine? They need to know if you have any of these conditions:  blood disorders  dihydropyrimidine dehydrogenase (DPD) deficiency  infection (especially a virus infection such as chickenpox, cold sores, or herpes)  kidney disease  liver disease  malnourished, poor nutrition  recent or ongoing radiation therapy  an unusual or allergic reaction to fluorouracil, other chemotherapy, other medicines, foods, dyes, or preservatives  pregnant or trying to get pregnant  breast-feeding How should I use this medicine? This drug is given as an infusion or injection into a vein. It is administered in a hospital or clinic by a specially trained health care professional. Talk to your pediatrician regarding the use of this medicine in children. Special care may be needed. Overdosage: If you think you have taken too much of this medicine contact a poison control center or emergency room at once. NOTE: This medicine is only for you. Do not share this medicine with others. What if I miss a dose? It is important not to miss your dose. Call your doctor or health care professional if you are unable to keep an appointment. What may interact with this medicine? Do not take this medicine with any of  the following medications:  live virus vaccines This medicine may also interact with the following medications:  medicines that treat or prevent blood clots like warfarin, enoxaparin, and dalteparin This list may not describe all possible interactions. Give your health care provider a list of all the medicines, herbs, non-prescription drugs, or dietary supplements you use. Also tell them if you smoke, drink alcohol, or use illegal drugs. Some items may interact with your medicine. What should I watch for while using this medicine? Visit your doctor for checks on your progress. This drug may make you feel generally unwell. This is not uncommon, as chemotherapy can affect healthy cells as well as cancer cells. Report any side effects. Continue your course of treatment even though you feel ill unless your doctor tells you to stop. In some cases, you may be given additional medicines to help with side effects. Follow all directions for their use. Call your doctor or health care professional for advice if you get a fever, chills or sore throat, or other symptoms of a cold or flu. Do not treat yourself. This drug decreases your body's ability to fight infections. Try to avoid being around people who are sick. This medicine may increase your risk to bruise or bleed. Call your doctor or health care professional if you notice any unusual bleeding. Be careful brushing and flossing your teeth or using a toothpick because you may get an infection or bleed more easily. If you have any dental work done, tell your dentist you are receiving this medicine. Avoid taking products that contain aspirin, acetaminophen, ibuprofen, naproxen, or ketoprofen unless instructed by your doctor. These medicines may hide a fever. Do not become pregnant while taking this medicine. Women should inform their doctor if they wish to become pregnant or think they might be pregnant. There is a potential for serious side effects to an unborn  child. Talk to your health care professional or pharmacist for more information. Do not breast-feed an infant while taking this medicine. Men should inform their doctor if they wish to father a child. This medicine may lower sperm counts. Do not treat diarrhea with over the counter products. Contact your doctor if you have diarrhea that lasts more than 2 days or if it is severe and watery. This medicine can  make you more sensitive to the sun. Keep out of the sun. If you cannot avoid being in the sun, wear protective clothing and use sunscreen. Do not use sun lamps or tanning beds/booths. What side effects may I notice from receiving this medicine? Side effects that you should report to your doctor or health care professional as soon as possible:  allergic reactions like skin rash, itching or hives, swelling of the face, lips, or tongue  low blood counts - this medicine may decrease the number of white blood cells, red blood cells and platelets. You may be at increased risk for infections and bleeding.  signs of infection - fever or chills, cough, sore throat, pain or difficulty passing urine  signs of decreased platelets or bleeding - bruising, pinpoint red spots on the skin, black, tarry stools, blood in the urine  signs of decreased red blood cells - unusually weak or tired, fainting spells, lightheadedness  breathing problems  changes in vision  chest pain  mouth sores  nausea and vomiting  pain, swelling, redness at site where injected  pain, tingling, numbness in the hands or feet  redness, swelling, or sores on hands or feet  stomach pain  unusual bleeding Side effects that usually do not require medical attention (report to your doctor or health care professional if they continue or are bothersome):  changes in finger or toe nails  diarrhea  dry or itchy skin  hair loss  headache  loss of appetite  sensitivity of eyes to the light  stomach upset  unusually  teary eyes This list may not describe all possible side effects. Call your doctor for medical advice about side effects. You may report side effects to FDA at 1-800-FDA-1088. Where should I keep my medicine? This drug is given in a hospital or clinic and will not be stored at home. NOTE: This sheet is a summary. It may not cover all possible information. If you have questions about this medicine, talk to your doctor, pharmacist, or health care provider.  2021 Elsevier/Gold Standard (2019-02-16 15:00:03)

## 2020-08-25 ENCOUNTER — Encounter: Payer: Self-pay | Admitting: *Deleted

## 2020-08-25 NOTE — Progress Notes (Signed)
Clinical Social Work received referral from medical oncology for transportation and psychosocial needs. CSW called patient and attempted to leave VM but VM has not been set up.  CSW will attempt again at a later time.  Maryjean Morn, MSW, LCSW, OSW-C Clinical Social Worker [facility] [phone]

## 2020-08-26 ENCOUNTER — Inpatient Hospital Stay: Payer: Medicaid Other

## 2020-08-26 ENCOUNTER — Other Ambulatory Visit: Payer: Self-pay

## 2020-08-26 VITALS — BP 155/108 | HR 76 | Temp 99.0°F | Resp 18

## 2020-08-26 DIAGNOSIS — Z5111 Encounter for antineoplastic chemotherapy: Secondary | ICD-10-CM | POA: Diagnosis not present

## 2020-08-26 DIAGNOSIS — C20 Malignant neoplasm of rectum: Secondary | ICD-10-CM

## 2020-08-26 DIAGNOSIS — Z452 Encounter for adjustment and management of vascular access device: Secondary | ICD-10-CM

## 2020-08-26 MED ORDER — HEPARIN SOD (PORK) LOCK FLUSH 100 UNIT/ML IV SOLN
250.0000 [IU] | Freq: Once | INTRAVENOUS | Status: AC
  Administered 2020-08-26: 250 [IU]
  Filled 2020-08-26: qty 5

## 2020-08-26 MED ORDER — SODIUM CHLORIDE 0.9% FLUSH
10.0000 mL | Freq: Once | INTRAVENOUS | Status: AC
  Administered 2020-08-26: 10 mL
  Filled 2020-08-26: qty 10

## 2020-08-29 ENCOUNTER — Inpatient Hospital Stay: Payer: Medicaid Other

## 2020-08-29 ENCOUNTER — Other Ambulatory Visit: Payer: Self-pay

## 2020-08-29 VITALS — BP 117/85 | HR 65 | Resp 17

## 2020-08-29 DIAGNOSIS — Z452 Encounter for adjustment and management of vascular access device: Secondary | ICD-10-CM

## 2020-08-29 DIAGNOSIS — Z5111 Encounter for antineoplastic chemotherapy: Secondary | ICD-10-CM | POA: Diagnosis not present

## 2020-08-29 DIAGNOSIS — C20 Malignant neoplasm of rectum: Secondary | ICD-10-CM

## 2020-08-29 MED ORDER — SODIUM CHLORIDE 0.9% FLUSH
10.0000 mL | Freq: Once | INTRAVENOUS | Status: AC
  Administered 2020-08-29: 10 mL
  Filled 2020-08-29: qty 10

## 2020-08-29 MED ORDER — HEPARIN SOD (PORK) LOCK FLUSH 100 UNIT/ML IV SOLN
250.0000 [IU] | Freq: Once | INTRAVENOUS | Status: AC
  Administered 2020-08-29: 250 [IU]
  Filled 2020-08-29: qty 5

## 2020-08-29 NOTE — Patient Instructions (Signed)
PICC Home Care Guide A peripherally inserted central catheter (PICC) is a form of IV access that allows medicines and IV fluids to be quickly distributed throughout the body. The PICC is a long, thin, flexible tube (catheter) that is inserted into a vein in the upper arm. The catheter ends in a large vein in the chest (superior vena cava, or SVC). After the PICC is inserted, a chest X-ray may be done to make sure that it is in the correct place. A PICC may be placed for different reasons, such as:  To give medicines and liquid nutrition.  To give IV fluids and blood products.  If there is trouble placing a peripheral intravenous (PIV) catheter. If taken care of properly, a PICC can remain in place for several months. Having a PICC can also allow a person to go home from the hospital sooner. Medicine and PICC care can be managed at home by a family member, caregiver, or home health care team. What are the risks? Generally, having a PICC is safe. However, problems may occur, including:  A blood clot (thrombus) forming in or at the tip of the PICC.  A blood clot forming in a vein (deep vein thrombosis) or traveling to the lung (pulmonary embolism).  Inflammation of the vein (phlebitis) in which the PICC is placed.  Infection. Central line associated blood stream infection (CLABSI) is a serious infection that often requires hospitalization.  PICC movement (malposition). The PICC tip may move from its original position due to excessive physical activity, forceful coughing, sneezing, or vomiting.  A break or cut in the PICC. It is important not to use scissors near the PICC.  Nerve or tendon irritation or injury during PICC insertion. How to take care of your PICC Preventing problems  You and any caregivers should wash your hands often with soap. Wash hands: ? Before touching the PICC line or the infusion device. ? Before changing a bandage (dressing).  Flush the PICC as told by your  health care provider. Let your health care provider know right away if the PICC is hard to flush or does not flush. Do not use force to flush the PICC.  Do not use a syringe that is less than 10 mL to flush the PICC.  Avoid blood pressure checks on the arm in which the PICC is placed.  Never pull or tug on the PICC.  Do not take the PICC out yourself. Only a trained clinical professional should remove the PICC.  Use clean and sterile supplies only. Keep the supplies in a dry place. Do not reuse needles, syringes, or any other supplies. Doing that can lead to infection.  Keep pets and children away from your PICC line.  Check the PICC insertion site every day for signs of infection. Check for: ? Leakage. ? Redness, swelling, or pain. ? Fluid or blood. ? Warmth. ? Pus or a bad smell. PICC dressing care  Keep your PICC bandage (dressing) clean and dry to prevent infection.  Do not take baths, swim, or use a hot tub until your health care provider approves. Ask your health care provider if you can take showers. You may only be allowed to take sponge baths for bathing. When you are allowed to shower: ? Ask your health care provider to teach you how to wrap the PICC line. ? Cover the PICC line with clear plastic wrap and tape to keep it dry while showering.  Follow instructions from your health care provider about   how to take care of your insertion site and dressing. Make sure you: ? Wash your hands with soap and water before you change your bandage (dressing). If soap and water are not available, use hand sanitizer. ? Change your dressing as told by your health care provider. ? Leave stitches (sutures), skin glue, or adhesive strips in place. These skin closures may need to stay in place for 2 weeks or longer. If adhesive strip edges start to loosen and curl up, you may trim the loose edges. Do not remove adhesive strips completely unless your health care provider tells you to do  that.  Change your PICC dressing if it becomes loose or wet. General instructions  Carry your PICC identification card or wear a medical alert bracelet at all times.  Keep the tube clamped at all times, unless it is being used.  Carry a smooth-edge clamp with you at all times to place on the tube if it breaks.  Do not use scissors or sharp objects near the tube.  You may bend your arm and move it freely. If your PICC is near or at the bend of your elbow, avoid activity with repeated motion at the elbow.  Avoid lifting heavy objects as told by your health care provider.  Keep all follow-up visits as told by your health care provider. This is important.   Disposal of supplies  Throw away any syringes in a disposal container that is meant for sharp items (sharps container). You can buy a sharps container from a pharmacy, or you can make one by using an empty hard plastic bottle with a cover.  Place any used dressings or infusion bags into a plastic bag. Throw that bag in the trash. Contact a health care provider if:  You have pain in your arm, ear, face, or teeth.  You have a fever or chills.  You have redness, swelling, or pain around the insertion site.  You have fluid or blood coming from the insertion site.  Your insertion site feels warm to the touch.  You have pus or a bad smell coming from the insertion site.  Your skin feels hard and raised around the insertion site. Get help right away if:  Your PICC is accidentally pulled all the way out. If this happens, cover the insertion site with a bandage or gauze dressing. Do not throw the PICC away. Your health care provider will need to check it.  Your PICC was tugged or pulled and has partially come out. Do not  push the PICC back in.  You cannot flush the PICC, it is hard to flush, or the PICC leaks around the insertion site when it is flushed.  You hear a "flushing" sound when the PICC is flushed.  You feel your  heart racing or skipping beats.  There is a hole or tear in the PICC.  You have swelling in the arm in which the PICC was inserted.  You have a red streak going up your arm from where the PICC was inserted. Summary  A peripherally inserted central catheter (PICC) is a long, thin, flexible tube (catheter) that is inserted into a vein in the upper arm.  The PICC is inserted using a sterile technique by a specially trained nurse or physician. Only a trained clinical professional should remove it.  Keep your PICC identification card with you at all times.  Avoid blood pressure checks on the arm in which the PICC is placed.  If cared for   properly, a PICC can remain in place for several months. Having a PICC can also allow a person to go home from the hospital sooner. This information is not intended to replace advice given to you by your health care provider. Make sure you discuss any questions you have with your health care provider. Document Revised: 07/28/2019 Document Reviewed: 07/28/2019 Elsevier Patient Education  2021 [employer]  

## 2020-08-29 NOTE — Progress Notes (Signed)
Craig West presented to clinic today for weekly PICC dressing change and lumen flushes. He was very quiet and non interactive when I got him from the waiting room. He would answer my questions in one word responses. At the end of his PICC dressing change, he became very sweaty, anxious, and restless. He was alert but would not respond to my questions or commands. He was rocking back and forth and side to side, doubling over at times. He would not verbally respond when I asked him what was going on or if he was in pain. He progressively became more restless, sweaty, and tachypneic. His BP at this time was 96/67, HR 57, RR 24, O2 100. Blood glucose obtained at 0913 was 138. Being in close contact during his dressing change, I noted a strong smell of marijuana coming from Craig West. During this episode, I asked him if he had smoked before he came in today. He immediately looked up at me and this was the first question he responded to, saying, "Did I smoke what?" I stated, "Marijuana. Did you smoke before you came today?" He did not reply. At this point, his restlessness and tachypnea had improved. He was more alert, but his affect was flat. At this time [name], NP came to assess patient. I explained to her what had happened and we explained together to the patient that we would have to keep him and observe him further. His BP at this time was 105/77, HR 65, RR 18, O2 100. Patient made comfortable in recliner and drink provided as requested. For the next hour, patient was closely observed. He sat in the recliner with his eyes closed during this time, withdrawn even when being interacted with. BP stable at 117/85. At approximately 1020 patient stated that he needed to go and that he had things to do. Myself and Lupita Raider, RN explained to patient that for his safety, we advise him to stay for further observation. We offered to call him a ride since he drove here by himself today. To this he responded, "I'm ok and  I'll make it. I'm ready to go." At this time he walked to the exit and left the building. [name], NP made aware that patient would not stay. Patients vitals were stable and he was in stable, ambulatory condition upon his dismissal.

## 2020-08-31 ENCOUNTER — Other Ambulatory Visit: Payer: Self-pay

## 2020-08-31 ENCOUNTER — Inpatient Hospital Stay: Payer: Medicaid Other | Attending: Oncology

## 2020-08-31 ENCOUNTER — Encounter: Payer: Self-pay | Admitting: Licensed Clinical Social Worker

## 2020-08-31 ENCOUNTER — Other Ambulatory Visit: Payer: Self-pay | Admitting: Oncology

## 2020-08-31 VITALS — BP 129/94 | HR 74 | Resp 18

## 2020-08-31 DIAGNOSIS — E1122 Type 2 diabetes mellitus with diabetic chronic kidney disease: Secondary | ICD-10-CM | POA: Diagnosis not present

## 2020-08-31 DIAGNOSIS — N189 Chronic kidney disease, unspecified: Secondary | ICD-10-CM | POA: Insufficient documentation

## 2020-08-31 DIAGNOSIS — C20 Malignant neoplasm of rectum: Secondary | ICD-10-CM

## 2020-08-31 DIAGNOSIS — Z452 Encounter for adjustment and management of vascular access device: Secondary | ICD-10-CM | POA: Diagnosis not present

## 2020-08-31 DIAGNOSIS — Z5111 Encounter for antineoplastic chemotherapy: Secondary | ICD-10-CM | POA: Insufficient documentation

## 2020-08-31 DIAGNOSIS — D649 Anemia, unspecified: Secondary | ICD-10-CM | POA: Diagnosis not present

## 2020-08-31 DIAGNOSIS — G629 Polyneuropathy, unspecified: Secondary | ICD-10-CM | POA: Diagnosis not present

## 2020-08-31 DIAGNOSIS — I129 Hypertensive chronic kidney disease with stage 1 through stage 4 chronic kidney disease, or unspecified chronic kidney disease: Secondary | ICD-10-CM | POA: Diagnosis not present

## 2020-08-31 MED ORDER — HEPARIN SOD (PORK) LOCK FLUSH 100 UNIT/ML IV SOLN
250.0000 [IU] | Freq: Once | INTRAVENOUS | Status: AC
  Administered 2020-08-31: 250 [IU]
  Filled 2020-08-31: qty 5

## 2020-08-31 MED ORDER — SODIUM CHLORIDE 0.9% FLUSH
10.0000 mL | Freq: Once | INTRAVENOUS | Status: AC
  Administered 2020-08-31: 10 mL
  Filled 2020-08-31: qty 10

## 2020-08-31 MED ORDER — SODIUM CHLORIDE 0.9% FLUSH
10.0000 mL | Freq: Once | INTRAVENOUS | Status: AC
Start: 2020-08-31 — End: 2020-08-31
  Administered 2020-08-31: 10 mL
  Filled 2020-08-31: qty 10

## 2020-08-31 NOTE — Progress Notes (Signed)
[facility] Work  Clinical Social Work was referred by medical oncology for transportation needs.  Clinical Social Worker contacted patient by phone. Provided information on transportation benefits through his insurance ([employer] med[employer]) and gave contact information for [facility]. Pt stated he will call them today to determine process for scheduling rides and/or reimbursement for gas.  No other needs at this time per pt.   [place], [place] Worker [employer]

## 2020-08-31 NOTE — Progress Notes (Signed)
Referral to social services

## 2020-08-31 NOTE — Patient Instructions (Signed)
PICC Home Care Guide A peripherally inserted central catheter (PICC) is a form of IV access that allows medicines and IV fluids to be quickly distributed throughout the body. The PICC is a long, thin, flexible tube (catheter) that is inserted into a vein in the upper arm. The catheter ends in a large vein in the chest (superior vena cava, or SVC). After the PICC is inserted, a chest X-ray may be done to make sure that it is in the correct place. A PICC may be placed for different reasons, such as:  To give medicines and liquid nutrition.  To give IV fluids and blood products.  If there is trouble placing a peripheral intravenous (PIV) catheter. If taken care of properly, a PICC can remain in place for several months. Having a PICC can also allow a person to go home from the hospital sooner. Medicine and PICC care can be managed at home by a family member, caregiver, or home health care team. What are the risks? Generally, having a PICC is safe. However, problems may occur, including:  A blood clot (thrombus) forming in or at the tip of the PICC.  A blood clot forming in a vein (deep vein thrombosis) or traveling to the lung (pulmonary embolism).  Inflammation of the vein (phlebitis) in which the PICC is placed.  Infection. Central line associated blood stream infection (CLABSI) is a serious infection that often requires hospitalization.  PICC movement (malposition). The PICC tip may move from its original position due to excessive physical activity, forceful coughing, sneezing, or vomiting.  A break or cut in the PICC. It is important not to use scissors near the PICC.  Nerve or tendon irritation or injury during PICC insertion. How to take care of your PICC Preventing problems  You and any caregivers should wash your hands often with soap. Wash hands: ? Before touching the PICC line or the infusion device. ? Before changing a bandage (dressing).  Flush the PICC as told by your  health care provider. Let your health care provider know right away if the PICC is hard to flush or does not flush. Do not use force to flush the PICC.  Do not use a syringe that is less than 10 mL to flush the PICC.  Avoid blood pressure checks on the arm in which the PICC is placed.  Never pull or tug on the PICC.  Do not take the PICC out yourself. Only a trained clinical professional should remove the PICC.  Use clean and sterile supplies only. Keep the supplies in a dry place. Do not reuse needles, syringes, or any other supplies. Doing that can lead to infection.  Keep pets and children away from your PICC line.  Check the PICC insertion site every day for signs of infection. Check for: ? Leakage. ? Redness, swelling, or pain. ? Fluid or blood. ? Warmth. ? Pus or a bad smell. PICC dressing care  Keep your PICC bandage (dressing) clean and dry to prevent infection.  Do not take baths, swim, or use a hot tub until your health care provider approves. Ask your health care provider if you can take showers. You may only be allowed to take sponge baths for bathing. When you are allowed to shower: ? Ask your health care provider to teach you how to wrap the PICC line. ? Cover the PICC line with clear plastic wrap and tape to keep it dry while showering.  Follow instructions from your health care provider about   how to take care of your insertion site and dressing. Make sure you: ? Wash your hands with soap and water before you change your bandage (dressing). If soap and water are not available, use hand sanitizer. ? Change your dressing as told by your health care provider. ? Leave stitches (sutures), skin glue, or adhesive strips in place. These skin closures may need to stay in place for 2 weeks or longer. If adhesive strip edges start to loosen and curl up, you may trim the loose edges. Do not remove adhesive strips completely unless your health care provider tells you to do  that.  Change your PICC dressing if it becomes loose or wet. General instructions  Carry your PICC identification card or wear a medical alert bracelet at all times.  Keep the tube clamped at all times, unless it is being used.  Carry a smooth-edge clamp with you at all times to place on the tube if it breaks.  Do not use scissors or sharp objects near the tube.  You may bend your arm and move it freely. If your PICC is near or at the bend of your elbow, avoid activity with repeated motion at the elbow.  Avoid lifting heavy objects as told by your health care provider.  Keep all follow-up visits as told by your health care provider. This is important.   Disposal of supplies  Throw away any syringes in a disposal container that is meant for sharp items (sharps container). You can buy a sharps container from a pharmacy, or you can make one by using an empty hard plastic bottle with a cover.  Place any used dressings or infusion bags into a plastic bag. Throw that bag in the trash. Contact a health care provider if:  You have pain in your arm, ear, face, or teeth.  You have a fever or chills.  You have redness, swelling, or pain around the insertion site.  You have fluid or blood coming from the insertion site.  Your insertion site feels warm to the touch.  You have pus or a bad smell coming from the insertion site.  Your skin feels hard and raised around the insertion site. Get help right away if:  Your PICC is accidentally pulled all the way out. If this happens, cover the insertion site with a bandage or gauze dressing. Do not throw the PICC away. Your health care provider will need to check it.  Your PICC was tugged or pulled and has partially come out. Do not  push the PICC back in.  You cannot flush the PICC, it is hard to flush, or the PICC leaks around the insertion site when it is flushed.  You hear a "flushing" sound when the PICC is flushed.  You feel your  heart racing or skipping beats.  There is a hole or tear in the PICC.  You have swelling in the arm in which the PICC was inserted.  You have a red streak going up your arm from where the PICC was inserted. Summary  A peripherally inserted central catheter (PICC) is a long, thin, flexible tube (catheter) that is inserted into a vein in the upper arm.  The PICC is inserted using a sterile technique by a specially trained nurse or physician. Only a trained clinical professional should remove it.  Keep your PICC identification card with you at all times.  Avoid blood pressure checks on the arm in which the PICC is placed.  If cared for   properly, a PICC can remain in place for several months. Having a PICC can also allow a person to go home from the hospital sooner. This information is not intended to replace advice given to you by your health care provider. Make sure you discuss any questions you have with your health care provider. Document Revised: 07/28/2019 Document Reviewed: 07/28/2019 Elsevier Patient Education  2021 [employer]  

## 2020-09-05 ENCOUNTER — Inpatient Hospital Stay: Payer: Medicaid Other

## 2020-09-05 ENCOUNTER — Other Ambulatory Visit: Payer: Self-pay

## 2020-09-05 DIAGNOSIS — Z5111 Encounter for antineoplastic chemotherapy: Secondary | ICD-10-CM | POA: Diagnosis not present

## 2020-09-05 DIAGNOSIS — C20 Malignant neoplasm of rectum: Secondary | ICD-10-CM

## 2020-09-05 DIAGNOSIS — Z452 Encounter for adjustment and management of vascular access device: Secondary | ICD-10-CM

## 2020-09-05 MED ORDER — HEPARIN SOD (PORK) LOCK FLUSH 100 UNIT/ML IV SOLN
500.0000 [IU] | Freq: Once | INTRAVENOUS | Status: AC
  Administered 2020-09-05: 250 [IU]
  Filled 2020-09-05: qty 5

## 2020-09-05 MED ORDER — SODIUM CHLORIDE 0.9% FLUSH
10.0000 mL | Freq: Once | INTRAVENOUS | Status: AC
  Administered 2020-09-05: 10 mL
  Filled 2020-09-05: qty 10

## 2020-09-05 NOTE — Patient Instructions (Signed)
PICC Home Care Guide A peripherally inserted central catheter (PICC) is a form of IV access that allows medicines and IV fluids to be quickly distributed throughout the body. The PICC is a long, thin, flexible tube (catheter) that is inserted into a vein in the upper arm. The catheter ends in a large vein in the chest (superior vena cava, or SVC). After the PICC is inserted, a chest X-ray may be done to make sure that it is in the correct place. A PICC may be placed for different reasons, such as:  To give medicines and liquid nutrition.  To give IV fluids and blood products.  If there is trouble placing a peripheral intravenous (PIV) catheter. If taken care of properly, a PICC can remain in place for several months. Having a PICC can also allow a person to go home from the hospital sooner. Medicine and PICC care can be managed at home by a family member, caregiver, or home health care team. What are the risks? Generally, having a PICC is safe. However, problems may occur, including:  A blood clot (thrombus) forming in or at the tip of the PICC.  A blood clot forming in a vein (deep vein thrombosis) or traveling to the lung (pulmonary embolism).  Inflammation of the vein (phlebitis) in which the PICC is placed.  Infection. Central line associated blood stream infection (CLABSI) is a serious infection that often requires hospitalization.  PICC movement (malposition). The PICC tip may move from its original position due to excessive physical activity, forceful coughing, sneezing, or vomiting.  A break or cut in the PICC. It is important not to use scissors near the PICC.  Nerve or tendon irritation or injury during PICC insertion. How to take care of your PICC Preventing problems  You and any caregivers should wash your hands often with soap. Wash hands: ? Before touching the PICC line or the infusion device. ? Before changing a bandage (dressing).  Flush the PICC as told by your  health care provider. Let your health care provider know right away if the PICC is hard to flush or does not flush. Do not use force to flush the PICC.  Do not use a syringe that is less than 10 mL to flush the PICC.  Avoid blood pressure checks on the arm in which the PICC is placed.  Never pull or tug on the PICC.  Do not take the PICC out yourself. Only a trained clinical professional should remove the PICC.  Use clean and sterile supplies only. Keep the supplies in a dry place. Do not reuse needles, syringes, or any other supplies. Doing that can lead to infection.  Keep pets and children away from your PICC line.  Check the PICC insertion site every day for signs of infection. Check for: ? Leakage. ? Redness, swelling, or pain. ? Fluid or blood. ? Warmth. ? Pus or a bad smell. PICC dressing care  Keep your PICC bandage (dressing) clean and dry to prevent infection.  Do not take baths, swim, or use a hot tub until your health care provider approves. Ask your health care provider if you can take showers. You may only be allowed to take sponge baths for bathing. When you are allowed to shower: ? Ask your health care provider to teach you how to wrap the PICC line. ? Cover the PICC line with clear plastic wrap and tape to keep it dry while showering.  Follow instructions from your health care provider about   how to take care of your insertion site and dressing. Make sure you: ? Wash your hands with soap and water before you change your bandage (dressing). If soap and water are not available, use hand sanitizer. ? Change your dressing as told by your health care provider. ? Leave stitches (sutures), skin glue, or adhesive strips in place. These skin closures may need to stay in place for 2 weeks or longer. If adhesive strip edges start to loosen and curl up, you may trim the loose edges. Do not remove adhesive strips completely unless your health care provider tells you to do  that.  Change your PICC dressing if it becomes loose or wet. General instructions  Carry your PICC identification card or wear a medical alert bracelet at all times.  Keep the tube clamped at all times, unless it is being used.  Carry a smooth-edge clamp with you at all times to place on the tube if it breaks.  Do not use scissors or sharp objects near the tube.  You may bend your arm and move it freely. If your PICC is near or at the bend of your elbow, avoid activity with repeated motion at the elbow.  Avoid lifting heavy objects as told by your health care provider.  Keep all follow-up visits as told by your health care provider. This is important.   Disposal of supplies  Throw away any syringes in a disposal container that is meant for sharp items (sharps container). You can buy a sharps container from a pharmacy, or you can make one by using an empty hard plastic bottle with a cover.  Place any used dressings or infusion bags into a plastic bag. Throw that bag in the trash. Contact a health care provider if:  You have pain in your arm, ear, face, or teeth.  You have a fever or chills.  You have redness, swelling, or pain around the insertion site.  You have fluid or blood coming from the insertion site.  Your insertion site feels warm to the touch.  You have pus or a bad smell coming from the insertion site.  Your skin feels hard and raised around the insertion site. Get help right away if:  Your PICC is accidentally pulled all the way out. If this happens, cover the insertion site with a bandage or gauze dressing. Do not throw the PICC away. Your health care provider will need to check it.  Your PICC was tugged or pulled and has partially come out. Do not  push the PICC back in.  You cannot flush the PICC, it is hard to flush, or the PICC leaks around the insertion site when it is flushed.  You hear a "flushing" sound when the PICC is flushed.  You feel your  heart racing or skipping beats.  There is a hole or tear in the PICC.  You have swelling in the arm in which the PICC was inserted.  You have a red streak going up your arm from where the PICC was inserted. Summary  A peripherally inserted central catheter (PICC) is a long, thin, flexible tube (catheter) that is inserted into a vein in the upper arm.  The PICC is inserted using a sterile technique by a specially trained nurse or physician. Only a trained clinical professional should remove it.  Keep your PICC identification card with you at all times.  Avoid blood pressure checks on the arm in which the PICC is placed.  If cared for   properly, a PICC can remain in place for several months. Having a PICC can also allow a person to go home from the hospital sooner. This information is not intended to replace advice given to you by your health care provider. Make sure you discuss any questions you have with your health care provider. Document Revised: 07/28/2019 Document Reviewed: 07/28/2019 Elsevier Patient Education  2021 [employer]  

## 2020-09-07 ENCOUNTER — Inpatient Hospital Stay: Payer: Medicaid Other

## 2020-09-07 ENCOUNTER — Encounter: Payer: Self-pay | Admitting: Nurse Practitioner

## 2020-09-07 ENCOUNTER — Inpatient Hospital Stay (HOSPITAL_BASED_OUTPATIENT_CLINIC_OR_DEPARTMENT_OTHER): Payer: Medicaid Other | Admitting: Nurse Practitioner

## 2020-09-07 ENCOUNTER — Inpatient Hospital Stay: Payer: Medicaid Other | Admitting: Oncology

## 2020-09-07 ENCOUNTER — Other Ambulatory Visit: Payer: Self-pay

## 2020-09-07 VITALS — BP 130/90 | HR 70 | Temp 98.0°F | Resp 20 | Ht 73.0 in | Wt 233.6 lb

## 2020-09-07 DIAGNOSIS — C20 Malignant neoplasm of rectum: Secondary | ICD-10-CM | POA: Diagnosis not present

## 2020-09-07 DIAGNOSIS — Z452 Encounter for adjustment and management of vascular access device: Secondary | ICD-10-CM

## 2020-09-07 DIAGNOSIS — Z5111 Encounter for antineoplastic chemotherapy: Secondary | ICD-10-CM | POA: Diagnosis not present

## 2020-09-07 LAB — CBC WITH DIFFERENTIAL (CANCER CENTER ONLY)
Abs Immature Granulocytes: 0.01 10*3/uL (ref 0.00–0.07)
Basophils Absolute: 0 10*3/uL (ref 0.0–0.1)
Basophils Relative: 1 %
Eosinophils Absolute: 0.2 10*3/uL (ref 0.0–0.5)
Eosinophils Relative: 4 %
HCT: 33.5 % — ABNORMAL LOW (ref 39.0–52.0)
Hemoglobin: 10.7 g/dL — ABNORMAL LOW (ref 13.0–17.0)
Immature Granulocytes: 0 %
Lymphocytes Relative: 11 %
Lymphs Abs: 0.6 10*3/uL — ABNORMAL LOW (ref 0.7–4.0)
MCH: 29.2 pg (ref 26.0–34.0)
MCHC: 31.9 g/dL (ref 30.0–36.0)
MCV: 91.5 fL (ref 80.0–100.0)
Monocytes Absolute: 0.8 10*3/uL (ref 0.1–1.0)
Monocytes Relative: 13 %
Neutro Abs: 4.2 10*3/uL (ref 1.7–7.7)
Neutrophils Relative %: 71 %
Platelet Count: 120 10*3/uL — ABNORMAL LOW (ref 150–400)
RBC: 3.66 MIL/uL — ABNORMAL LOW (ref 4.22–5.81)
RDW: 20.4 % — ABNORMAL HIGH (ref 11.5–15.5)
WBC Count: 5.8 10*3/uL (ref 4.0–10.5)
nRBC: 0 % (ref 0.0–0.2)

## 2020-09-07 LAB — CMP (CANCER CENTER ONLY)
ALT: 10 U/L (ref 0–44)
AST: 15 U/L (ref 15–41)
Albumin: 3.4 g/dL — ABNORMAL LOW (ref 3.5–5.0)
Alkaline Phosphatase: 99 U/L (ref 38–126)
Anion gap: 11 (ref 5–15)
BUN: 27 mg/dL — ABNORMAL HIGH (ref 6–20)
CO2: 23 mmol/L (ref 22–32)
Calcium: 7 mg/dL — ABNORMAL LOW (ref 8.9–10.3)
Chloride: 107 mmol/L (ref 98–111)
Creatinine: 2.86 mg/dL — ABNORMAL HIGH (ref 0.61–1.24)
GFR, Estimated: 25 mL/min — ABNORMAL LOW (ref >60)
Glucose, Bld: 128 mg/dL — ABNORMAL HIGH (ref 70–99)
Potassium: 3.5 mmol/L (ref 3.5–5.1)
Sodium: 141 mmol/L (ref 135–145)
Total Bilirubin: 0.5 mg/dL (ref 0.3–1.2)
Total Protein: 6.2 g/dL — ABNORMAL LOW (ref 6.5–8.1)

## 2020-09-07 MED ORDER — OXALIPLATIN CHEMO INJECTION 100 MG/20ML
65.0000 mg/m2 | Freq: Once | INTRAVENOUS | Status: AC
  Administered 2020-09-07: 155 mg via INTRAVENOUS
  Filled 2020-09-07: qty 31

## 2020-09-07 MED ORDER — DEXTROSE 5 % IV SOLN
Freq: Once | INTRAVENOUS | Status: AC
Start: 2020-09-07 — End: 2020-09-07
  Filled 2020-09-07: qty 250

## 2020-09-07 MED ORDER — SODIUM CHLORIDE 0.9% FLUSH
10.0000 mL | Freq: Once | INTRAVENOUS | Status: AC
Start: 2020-09-07 — End: 2020-09-07
  Administered 2020-09-07: 10 mL
  Filled 2020-09-07: qty 10

## 2020-09-07 MED ORDER — FLUOROURACIL CHEMO INJECTION 2.5 GM/50ML
300.0000 mg/m2 | Freq: Once | INTRAVENOUS | Status: AC
  Administered 2020-09-07: 700 mg via INTRAVENOUS
  Filled 2020-09-07: qty 14

## 2020-09-07 MED ORDER — PALONOSETRON HCL INJECTION 0.25 MG/5ML
0.2500 mg | Freq: Once | INTRAVENOUS | Status: AC
  Administered 2020-09-07: 0.25 mg via INTRAVENOUS
  Filled 2020-09-07: qty 5

## 2020-09-07 MED ORDER — DEXTROSE 5 % IV SOLN
Freq: Once | INTRAVENOUS | Status: AC
  Filled 2020-09-07: qty 250

## 2020-09-07 MED ORDER — SODIUM CHLORIDE 0.9 % IV SOLN
10.0000 mg | Freq: Once | INTRAVENOUS | Status: AC
  Administered 2020-09-07: 10 mg via INTRAVENOUS
  Filled 2020-09-07: qty 1

## 2020-09-07 MED ORDER — HEPARIN SOD (PORK) LOCK FLUSH 100 UNIT/ML IV SOLN
250.0000 [IU] | Freq: Once | INTRAVENOUS | Status: AC
  Administered 2020-09-07: 250 [IU]
  Filled 2020-09-07: qty 5

## 2020-09-07 MED ORDER — SODIUM CHLORIDE 0.9 % IV SOLN
5000.0000 mg | INTRAVENOUS | Status: DC
  Administered 2020-09-07: 5000 mg via INTRAVENOUS
  Filled 2020-09-07: qty 100

## 2020-09-07 MED ORDER — LEUCOVORIN CALCIUM INJECTION 350 MG
300.0000 mg/m2 | Freq: Once | INTRAVENOUS | Status: AC
  Administered 2020-09-07: 718 mg via INTRAVENOUS
  Filled 2020-09-07: qty 35.9

## 2020-09-07 NOTE — Patient Instructions (Signed)
[facility]  Discharge Instructions: Thank you for choosing [facility] to provide your oncology and hematology care.   If you have a lab appointment with the [facility], please go directly to the [facility] and check in at the registration area.   Wear comfortable clothing and clothing appropriate for easy access to any Portacath or PICC line.   We strive to give you quality time with your provider. You may need to reschedule your appointment if you arrive late (15 or more minutes).  Arriving late affects you and other patients whose appointments are after yours.  Also, if you miss three or more appointments without notifying the office, you may be dismissed from the clinic at the provider's discretion.      For prescription refill requests, have your pharmacy contact our office and allow 72 hours for refills to be completed.    Today you received the following chemotherapy and/or immunotherapy agents Oxaliplatin, Leucovorin, 5FU      To help prevent nausea and vomiting after your treatment, we encourage you to take your nausea medication as directed.  BELOW ARE SYMPTOMS THAT SHOULD BE REPORTED IMMEDIATELY: *FEVER GREATER THAN 100.4 F (38 C) OR HIGHER *CHILLS OR SWEATING *NAUSEA AND VOMITING THAT IS NOT CONTROLLED WITH YOUR NAUSEA MEDICATION *UNUSUAL SHORTNESS OF BREATH *UNUSUAL BRUISING OR BLEEDING *URINARY PROBLEMS (pain or burning when urinating, or frequent urination) *BOWEL PROBLEMS (unusual diarrhea, constipation, pain near the anus) TENDERNESS IN MOUTH AND THROAT WITH OR WITHOUT PRESENCE OF ULCERS (sore throat, sores in mouth, or a toothache) UNUSUAL RASH, SWELLING OR PAIN  UNUSUAL VAGINAL DISCHARGE OR ITCHING   Items with * indicate a potential emergency and should be followed up as soon as possible or go to the Emergency Department if any problems should occur.  Please show the CHEMOTHERAPY ALERT CARD or IMMUNOTHERAPY ALERT CARD  at check-in to the Emergency Department and triage nurse.  Should you have questions after your visit or need to cancel or reschedule your appointment, please contact [facility]  Dept: [phone]  and follow the prompts.  Office hours are 8:00 a.m. to 4:30 p.m. Monday - Friday. Please note that voicemails left after 4:00 p.m. may not be returned until the following business day.  We are closed weekends and major holidays. You have access to a nurse at all times for urgent questions. Please call the main number to the clinic Dept: [phone] and follow the prompts.   For any non-urgent questions, you may also contact your provider using MyChart. We now offer e-Visits for anyone 70 and older to request care online for non-urgent symptoms. For details visit [email or web address].   Also download the MyChart app! Go to the app store, search "MyChart", open the app, select [facility], and log in with your MyChart username and password.  Due to Covid, a mask is required upon entering the hospital/clinic. If you do not have a mask, one will be given to you upon arrival. For doctor visits, patients may have 1 support person aged 30 or older with them. For treatment visits, patients cannot have anyone with them due to current Covid guidelines and our immunocompromised population.

## 2020-09-07 NOTE — Patient Instructions (Signed)
PICC Home Care Guide A peripherally inserted central catheter (PICC) is a form of IV access that allows medicines and IV fluids to be quickly distributed throughout the body. The PICC is a long, thin, flexible tube (catheter) that is inserted into a vein in the upper arm. The catheter ends in a large vein in the chest (superior vena cava, or SVC). After the PICC is inserted, a chest X-ray may be done to make sure that it is in the correct place. A PICC may be placed for different reasons, such as:  To give medicines and liquid nutrition.  To give IV fluids and blood products.  If there is trouble placing a peripheral intravenous (PIV) catheter. If taken care of properly, a PICC can remain in place for several months. Having a PICC can also allow a person to go home from the hospital sooner. Medicine and PICC care can be managed at home by a family member, caregiver, or home health care team. What are the risks? Generally, having a PICC is safe. However, problems may occur, including:  A blood clot (thrombus) forming in or at the tip of the PICC.  A blood clot forming in a vein (deep vein thrombosis) or traveling to the lung (pulmonary embolism).  Inflammation of the vein (phlebitis) in which the PICC is placed.  Infection. Central line associated blood stream infection (CLABSI) is a serious infection that often requires hospitalization.  PICC movement (malposition). The PICC tip may move from its original position due to excessive physical activity, forceful coughing, sneezing, or vomiting.  A break or cut in the PICC. It is important not to use scissors near the PICC.  Nerve or tendon irritation or injury during PICC insertion. How to take care of your PICC Preventing problems  You and any caregivers should wash your hands often with soap. Wash hands: ? Before touching the PICC line or the infusion device. ? Before changing a bandage (dressing).  Flush the PICC as told by your  health care provider. Let your health care provider know right away if the PICC is hard to flush or does not flush. Do not use force to flush the PICC.  Do not use a syringe that is less than 10 mL to flush the PICC.  Avoid blood pressure checks on the arm in which the PICC is placed.  Never pull or tug on the PICC.  Do not take the PICC out yourself. Only a trained clinical professional should remove the PICC.  Use clean and sterile supplies only. Keep the supplies in a dry place. Do not reuse needles, syringes, or any other supplies. Doing that can lead to infection.  Keep pets and children away from your PICC line.  Check the PICC insertion site every day for signs of infection. Check for: ? Leakage. ? Redness, swelling, or pain. ? Fluid or blood. ? Warmth. ? Pus or a bad smell. PICC dressing care  Keep your PICC bandage (dressing) clean and dry to prevent infection.  Do not take baths, swim, or use a hot tub until your health care provider approves. Ask your health care provider if you can take showers. You may only be allowed to take sponge baths for bathing. When you are allowed to shower: ? Ask your health care provider to teach you how to wrap the PICC line. ? Cover the PICC line with clear plastic wrap and tape to keep it dry while showering.  Follow instructions from your health care provider about   how to take care of your insertion site and dressing. Make sure you: ? Wash your hands with soap and water before you change your bandage (dressing). If soap and water are not available, use hand sanitizer. ? Change your dressing as told by your health care provider. ? Leave stitches (sutures), skin glue, or adhesive strips in place. These skin closures may need to stay in place for 2 weeks or longer. If adhesive strip edges start to loosen and curl up, you may trim the loose edges. Do not remove adhesive strips completely unless your health care provider tells you to do  that.  Change your PICC dressing if it becomes loose or wet. General instructions  Carry your PICC identification card or wear a medical alert bracelet at all times.  Keep the tube clamped at all times, unless it is being used.  Carry a smooth-edge clamp with you at all times to place on the tube if it breaks.  Do not use scissors or sharp objects near the tube.  You may bend your arm and move it freely. If your PICC is near or at the bend of your elbow, avoid activity with repeated motion at the elbow.  Avoid lifting heavy objects as told by your health care provider.  Keep all follow-up visits as told by your health care provider. This is important.   Disposal of supplies  Throw away any syringes in a disposal container that is meant for sharp items (sharps container). You can buy a sharps container from a pharmacy, or you can make one by using an empty hard plastic bottle with a cover.  Place any used dressings or infusion bags into a plastic bag. Throw that bag in the trash. Contact a health care provider if:  You have pain in your arm, ear, face, or teeth.  You have a fever or chills.  You have redness, swelling, or pain around the insertion site.  You have fluid or blood coming from the insertion site.  Your insertion site feels warm to the touch.  You have pus or a bad smell coming from the insertion site.  Your skin feels hard and raised around the insertion site. Get help right away if:  Your PICC is accidentally pulled all the way out. If this happens, cover the insertion site with a bandage or gauze dressing. Do not throw the PICC away. Your health care provider will need to check it.  Your PICC was tugged or pulled and has partially come out. Do not  push the PICC back in.  You cannot flush the PICC, it is hard to flush, or the PICC leaks around the insertion site when it is flushed.  You hear a "flushing" sound when the PICC is flushed.  You feel your  heart racing or skipping beats.  There is a hole or tear in the PICC.  You have swelling in the arm in which the PICC was inserted.  You have a red streak going up your arm from where the PICC was inserted. Summary  A peripherally inserted central catheter (PICC) is a long, thin, flexible tube (catheter) that is inserted into a vein in the upper arm.  The PICC is inserted using a sterile technique by a specially trained nurse or physician. Only a trained clinical professional should remove it.  Keep your PICC identification card with you at all times.  Avoid blood pressure checks on the arm in which the PICC is placed.  If cared for   properly, a PICC can remain in place for several months. Having a PICC can also allow a person to go home from the hospital sooner. This information is not intended to replace advice given to you by your health care provider. Make sure you discuss any questions you have with your health care provider. Document Revised: 07/28/2019 Document Reviewed: 07/28/2019 Elsevier Patient Education  2021 [employer]  

## 2020-09-07 NOTE — Progress Notes (Signed)
  [facility] OFFICE PROGRESS NOTE   Diagnosis: Rectal cancer  INTERVAL HISTORY:   Mr.Ourada returns as scheduled.  He completed cycle 6 FOLFOX 08/24/2020.  He denies nausea/vomiting.  No mouth sores.  No diarrhea.  He has persistent mild cold sensitivity.  No numbness or tingling in the absence of cold exposure.  Objective:  Vital signs in last 24 hours:  Blood pressure 130/90, pulse 70, temperature 98 F (36.7 C), temperature source Oral, resp. rate 20, height 6\' 1"  (1.854 m), weight 233 lb 9.6 oz (106 kg), SpO2 99 %.    HEENT: No thrush or ulcers. Resp: Lungs clear bilaterally. Cardio: Regular rate and rhythm. GI: Abdomen soft and nontender.  No hepatomegaly. Vascular: No leg edema. Neuro: Very minimal decrease in vibratory sense over the fingertips per tuning fork exam. Skin: Palms dry appearing, hyperpigmentation present. Port-A-Cath without erythema.  Lab Results:  Lab Results  Component Value Date   WBC 5.8 09/07/2020   HGB 10.7 (L) 09/07/2020   HCT 33.5 (L) 09/07/2020   MCV 91.5 09/07/2020   PLT 120 (L) 09/07/2020   NEUTROABS 4.2 09/07/2020    Imaging:  No results found.  Medications: I have reviewed the patient's current medications.  Assessment/Plan: Rectal cancer-rectal mass at 5-6 cm from the anal verge on colonoscopy 01/11/2020, biopsy confirmed adenocarcinoma CTs 01/11/2020-rectal thickening, small perirectal lymph nodes and external iliac nodes, moderate right pleural effusion MRI pelvis 01/27/2020-T3bN2b tumor at 7.1 cm from the internal anal sphincter, multiple mesorectal nodes, indistinct left pelvic sidewall node and enlarged bilateral external iliac nodes PET scan 02/11/2020-hypermetabolic rectosigmoid mass, hypermetabolic mesorectal and sigmoid mesocolon lymph nodes, left external iliac/obturator node with mild hypermetabolism, 18 mm left inguinal node with normal architecture and slight hypermetabolism Radiation and infusional 5-FU  02/21/2020-04/04/2020 CTs 05/09/2020-decreased perirectal and sigmoid mesocolon lymphadenopathy, no evidence of disease progression MRI pelvis 05/17/2020-no well-defined residual rectal mass seen.  Wall appears slightly irregular.  No perirectal extension of tumor.  Again identified is perirectal adenopathy, similar to most recent CT 05/10/2020 but improved compared to PET/CT of 02/11/2020. Cycle 1 FOLFOX 06/14/2020 06/27/2020 chemotherapy held due to hypertension Cycle 2 FOLFOX 06/29/2020 Cycle 3 FOLFOX 07/13/2020 Cycle 4 FOLFOX 07/27/2020 Cycle 5 FOLFOX 08/09/2020 Cycle 6 FOLFOX 08/24/2020 Cycle 7 FOLFOX 09/07/2020 Anemia Chronic renal failure Congestive heart failure-severe LVH Diabetes Gout Peripheral neuropathy? Hypertension  Disposition: Mr. Fera appears stable.  He has completed 6 cycles of FOLFOX.  He continues to tolerate well.  Plan to proceed with cycle 7 today as scheduled.  We reviewed the CBC from today.  Labs adequate to proceed with treatment.  He will return for lab, follow-up, cycle 8 FOLFOX in 2 weeks.  He reports he has follow-up scheduled with Dr. Hester Mates after the final cycle of chemotherapy.    [name] ANP/GNP-BC   09/07/2020  9:19 AM

## 2020-09-09 ENCOUNTER — Other Ambulatory Visit: Payer: Self-pay

## 2020-09-09 ENCOUNTER — Inpatient Hospital Stay: Payer: Medicaid Other

## 2020-09-09 VITALS — BP 140/91 | HR 83 | Temp 97.2°F | Resp 19

## 2020-09-09 DIAGNOSIS — C20 Malignant neoplasm of rectum: Secondary | ICD-10-CM

## 2020-09-09 MED ORDER — HEPARIN SOD (PORK) LOCK FLUSH 100 UNIT/ML IV SOLN
500.0000 [IU] | Freq: Once | INTRAVENOUS | Status: DC | PRN
  Filled 2020-09-09: qty 5

## 2020-09-09 MED ORDER — SODIUM CHLORIDE 0.9% FLUSH
10.0000 mL | INTRAVENOUS | Status: DC | PRN
  Filled 2020-09-09: qty 10

## 2020-09-12 ENCOUNTER — Inpatient Hospital Stay: Payer: Medicaid Other

## 2020-09-12 ENCOUNTER — Other Ambulatory Visit: Payer: Self-pay

## 2020-09-12 VITALS — BP 111/71 | HR 76 | Temp 98.0°F | Resp 18

## 2020-09-12 DIAGNOSIS — Z452 Encounter for adjustment and management of vascular access device: Secondary | ICD-10-CM

## 2020-09-12 DIAGNOSIS — C20 Malignant neoplasm of rectum: Secondary | ICD-10-CM

## 2020-09-12 DIAGNOSIS — Z5111 Encounter for antineoplastic chemotherapy: Secondary | ICD-10-CM | POA: Diagnosis not present

## 2020-09-12 MED ORDER — SODIUM CHLORIDE 0.9% FLUSH
10.0000 mL | Freq: Once | INTRAVENOUS | Status: AC
  Administered 2020-09-12: 10 mL
  Filled 2020-09-12: qty 10

## 2020-09-12 MED ORDER — HEPARIN SOD (PORK) LOCK FLUSH 100 UNIT/ML IV SOLN
250.0000 [IU] | Freq: Once | INTRAVENOUS | Status: AC
Start: 2020-09-12 — End: 2020-09-12
  Administered 2020-09-12: 250 [IU]
  Filled 2020-09-12: qty 5

## 2020-09-12 NOTE — Patient Instructions (Signed)
PICC Home Care Guide A peripherally inserted central catheter (PICC) is a form of IV access that allows medicines and IV fluids to be quickly distributed throughout the body. The PICC is a long, thin, flexible tube (catheter) that is inserted into a vein in the upper arm. The catheter ends in a large vein in the chest (superior vena cava, or SVC). After the PICC is inserted, a chest X-ray may be done to make surethat it is in the correct place. A PICC may be placed for different reasons, such as: To give medicines and liquid nutrition. To give IV fluids and blood products. If there is trouble placing a peripheral intravenous (PIV) catheter. If taken care of properly, a PICC can remain in place for several months. Having a PICC can also allow a person to go home from the hospital sooner. Medicine and PICC care can be managed at home by a family member, caregiver, orhome health care team. What are the risks? Generally, having a PICC is safe. However, problems may occur, including: A blood clot (thrombus) forming in or at the tip of the PICC. A blood clot forming in a vein (deep vein thrombosis) or traveling to the lung (pulmonary embolism). Inflammation of the vein (phlebitis) in which the PICC is placed. Infection. Central line associated blood stream infection (CLABSI) is a serious infection that often requires hospitalization. PICC movement (malposition). The PICC tip may move from its original position due to excessive physical activity, forceful coughing, sneezing, or vomiting. A break or cut in the PICC. It is important not to use scissors near the PICC. Nerve or tendon irritation or injury during PICC insertion. How to take care of your PICC Preventing problems You and any caregivers should wash your hands often with soap. Wash hands: Before touching the PICC line or the infusion device. Before changing a bandage (dressing). Flush the PICC as told by your health care provider. Let your  health care provider know right away if the PICC is hard to flush or does not flush. Do not use force to flush the PICC. Do not use a syringe that is less than 10 mL to flush the PICC. Avoid blood pressure checks on the arm in which the PICC is placed. Never pull or tug on the PICC. Do not take the PICC out yourself. Only a trained clinical professional should remove the PICC. Use clean and sterile supplies only. Keep the supplies in a dry place. Do not reuse needles, syringes, or any other supplies. Doing that can lead to infection. Keep pets and children away from your PICC line. Check the PICC insertion site every day for signs of infection. Check for: Leakage. Redness, swelling, or pain. Fluid or blood. Warmth. Pus or a bad smell. PICC dressing care Keep your PICC bandage (dressing) clean and dry to prevent infection. Do not take baths, swim, or use a hot tub until your health care provider approves. Ask your health care provider if you can take showers. You may only be allowed to take sponge baths for bathing. When you are allowed to shower: Ask your health care provider to teach you how to wrap the PICC line. Cover the PICC line with clear plastic wrap and tape to keep it dry while showering. Follow instructions from your health care provider about how to take care of your insertion site and dressing. Make sure you: Wash your hands with soap and water before you change your bandage (dressing). If soap and water are not available,  use hand sanitizer. Change your dressing as told by your health care provider. Leave stitches (sutures), skin glue, or adhesive strips in place. These skin closures may need to stay in place for 2 weeks or longer. If adhesive strip edges start to loosen and curl up, you may trim the loose edges. Do not remove adhesive strips completely unless your health care provider tells you to do that. Change your PICC dressing if it becomes loose or wet. General  instructions  Carry your PICC identification card or wear a medical alert bracelet at all times. Keep the tube clamped at all times, unless it is being used. Carry a smooth-edge clamp with you at all times to place on the tube if it breaks. Do not use scissors or sharp objects near the tube. You may bend your arm and move it freely. If your PICC is near or at the bend of your elbow, avoid activity with repeated motion at the elbow. Avoid lifting heavy objects as told by your health care provider. Keep all follow-up visits as told by your health care provider. This is important.  Disposal of supplies Throw away any syringes in a disposal container that is meant for sharp items (sharps container). You can buy a sharps container from a pharmacy, or you can make one by using an empty hard plastic bottle with a cover. Place any used dressings or infusion bags into a plastic bag. Throw that bag in the trash. Contact a health care provider if: You have pain in your arm, ear, face, or teeth. You have a fever or chills. You have redness, swelling, or pain around the insertion site. You have fluid or blood coming from the insertion site. Your insertion site feels warm to the touch. You have pus or a bad smell coming from the insertion site. Your skin feels hard and raised around the insertion site. Get help right away if: Your PICC is accidentally pulled all the way out. If this happens, cover the insertion site with a bandage or gauze dressing. Do not throw the PICC away. Your health care provider will need to check it. Your PICC was tugged or pulled and has partially come out. Do not  push the PICC back in. You cannot flush the PICC, it is hard to flush, or the PICC leaks around the insertion site when it is flushed. You hear a "flushing" sound when the PICC is flushed. You feel your heart racing or skipping beats. There is a hole or tear in the PICC. You have swelling in the arm in which the  PICC was inserted. You have a red streak going up your arm from where the PICC was inserted. Summary A peripherally inserted central catheter (PICC) is a long, thin, flexible tube (catheter) that is inserted into a vein in the upper arm. The PICC is inserted using a sterile technique by a specially trained nurse or physician. Only a trained clinical professional should remove it. Keep your PICC identification card with you at all times. Avoid blood pressure checks on the arm in which the PICC is placed. If cared for properly, a PICC can remain in place for several months. Having a PICC can also allow a person to go home from the hospital sooner. This information is not intended to replace advice given to you by your health care provider. Make sure you discuss any questions you have with your healthcare provider. Document Revised: 07/28/2019 Document Reviewed: 07/28/2019 Elsevier Patient Education  2022 [employer].

## 2020-09-14 ENCOUNTER — Other Ambulatory Visit: Payer: Self-pay

## 2020-09-14 ENCOUNTER — Inpatient Hospital Stay: Payer: Medicaid Other

## 2020-09-14 DIAGNOSIS — C20 Malignant neoplasm of rectum: Secondary | ICD-10-CM

## 2020-09-14 DIAGNOSIS — Z452 Encounter for adjustment and management of vascular access device: Secondary | ICD-10-CM

## 2020-09-14 DIAGNOSIS — Z5111 Encounter for antineoplastic chemotherapy: Secondary | ICD-10-CM | POA: Diagnosis not present

## 2020-09-14 MED ORDER — SODIUM CHLORIDE 0.9% FLUSH
10.0000 mL | Freq: Once | INTRAVENOUS | Status: AC
Start: 2020-09-14 — End: 2020-09-14
  Administered 2020-09-14: 10 mL
  Filled 2020-09-14: qty 10

## 2020-09-14 MED ORDER — HEPARIN SOD (PORK) LOCK FLUSH 100 UNIT/ML IV SOLN
250.0000 [IU] | Freq: Once | INTRAVENOUS | Status: AC
  Administered 2020-09-14: 250 [IU]
  Filled 2020-09-14: qty 5

## 2020-09-14 NOTE — Patient Instructions (Signed)
PICC Home Care Guide A peripherally inserted central catheter (PICC) is a form of IV access that allows medicines and IV fluids to be quickly distributed throughout the body. The PICC is a long, thin, flexible tube (catheter) that is inserted into a vein in the upper arm. The catheter ends in a large vein in the chest (superior vena cava, or SVC). After the PICC is inserted, a chest X-ray may be done to make surethat it is in the correct place. A PICC may be placed for different reasons, such as: To give medicines and liquid nutrition. To give IV fluids and blood products. If there is trouble placing a peripheral intravenous (PIV) catheter. If taken care of properly, a PICC can remain in place for several months. Having a PICC can also allow a person to go home from the hospital sooner. Medicine and PICC care can be managed at home by a family member, caregiver, orhome health care team. What are the risks? Generally, having a PICC is safe. However, problems may occur, including: A blood clot (thrombus) forming in or at the tip of the PICC. A blood clot forming in a vein (deep vein thrombosis) or traveling to the lung (pulmonary embolism). Inflammation of the vein (phlebitis) in which the PICC is placed. Infection. Central line associated blood stream infection (CLABSI) is a serious infection that often requires hospitalization. PICC movement (malposition). The PICC tip may move from its original position due to excessive physical activity, forceful coughing, sneezing, or vomiting. A break or cut in the PICC. It is important not to use scissors near the PICC. Nerve or tendon irritation or injury during PICC insertion. How to take care of your PICC Preventing problems You and any caregivers should wash your hands often with soap. Wash hands: Before touching the PICC line or the infusion device. Before changing a bandage (dressing). Flush the PICC as told by your health care provider. Let your  health care provider know right away if the PICC is hard to flush or does not flush. Do not use force to flush the PICC. Do not use a syringe that is less than 10 mL to flush the PICC. Avoid blood pressure checks on the arm in which the PICC is placed. Never pull or tug on the PICC. Do not take the PICC out yourself. Only a trained clinical professional should remove the PICC. Use clean and sterile supplies only. Keep the supplies in a dry place. Do not reuse needles, syringes, or any other supplies. Doing that can lead to infection. Keep pets and children away from your PICC line. Check the PICC insertion site every day for signs of infection. Check for: Leakage. Redness, swelling, or pain. Fluid or blood. Warmth. Pus or a bad smell. PICC dressing care Keep your PICC bandage (dressing) clean and dry to prevent infection. Do not take baths, swim, or use a hot tub until your health care provider approves. Ask your health care provider if you can take showers. You may only be allowed to take sponge baths for bathing. When you are allowed to shower: Ask your health care provider to teach you how to wrap the PICC line. Cover the PICC line with clear plastic wrap and tape to keep it dry while showering. Follow instructions from your health care provider about how to take care of your insertion site and dressing. Make sure you: Wash your hands with soap and water before you change your bandage (dressing). If soap and water are not available,  use hand sanitizer. Change your dressing as told by your health care provider. Leave stitches (sutures), skin glue, or adhesive strips in place. These skin closures may need to stay in place for 2 weeks or longer. If adhesive strip edges start to loosen and curl up, you may trim the loose edges. Do not remove adhesive strips completely unless your health care provider tells you to do that. Change your PICC dressing if it becomes loose or wet. General  instructions  Carry your PICC identification card or wear a medical alert bracelet at all times. Keep the tube clamped at all times, unless it is being used. Carry a smooth-edge clamp with you at all times to place on the tube if it breaks. Do not use scissors or sharp objects near the tube. You may bend your arm and move it freely. If your PICC is near or at the bend of your elbow, avoid activity with repeated motion at the elbow. Avoid lifting heavy objects as told by your health care provider. Keep all follow-up visits as told by your health care provider. This is important.  Disposal of supplies Throw away any syringes in a disposal container that is meant for sharp items (sharps container). You can buy a sharps container from a pharmacy, or you can make one by using an empty hard plastic bottle with a cover. Place any used dressings or infusion bags into a plastic bag. Throw that bag in the trash. Contact a health care provider if: You have pain in your arm, ear, face, or teeth. You have a fever or chills. You have redness, swelling, or pain around the insertion site. You have fluid or blood coming from the insertion site. Your insertion site feels warm to the touch. You have pus or a bad smell coming from the insertion site. Your skin feels hard and raised around the insertion site. Get help right away if: Your PICC is accidentally pulled all the way out. If this happens, cover the insertion site with a bandage or gauze dressing. Do not throw the PICC away. Your health care provider will need to check it. Your PICC was tugged or pulled and has partially come out. Do not  push the PICC back in. You cannot flush the PICC, it is hard to flush, or the PICC leaks around the insertion site when it is flushed. You hear a "flushing" sound when the PICC is flushed. You feel your heart racing or skipping beats. There is a hole or tear in the PICC. You have swelling in the arm in which the  PICC was inserted. You have a red streak going up your arm from where the PICC was inserted. Summary A peripherally inserted central catheter (PICC) is a long, thin, flexible tube (catheter) that is inserted into a vein in the upper arm. The PICC is inserted using a sterile technique by a specially trained nurse or physician. Only a trained clinical professional should remove it. Keep your PICC identification card with you at all times. Avoid blood pressure checks on the arm in which the PICC is placed. If cared for properly, a PICC can remain in place for several months. Having a PICC can also allow a person to go home from the hospital sooner. This information is not intended to replace advice given to you by your health care provider. Make sure you discuss any questions you have with your healthcare provider. Document Revised: 07/28/2019 Document Reviewed: 07/28/2019 Elsevier Patient Education  2022 [employer].

## 2020-09-16 ENCOUNTER — Other Ambulatory Visit: Payer: Self-pay | Admitting: Oncology

## 2020-09-19 ENCOUNTER — Inpatient Hospital Stay: Payer: Medicaid Other

## 2020-09-19 ENCOUNTER — Other Ambulatory Visit: Payer: Self-pay

## 2020-09-19 NOTE — Progress Notes (Signed)
Here for dressing change. Behavior erratic. Pt admits to taking am BP meds and smoking weed.  Discussed many concerns about pt safety and the safety of others.  Verbalized understanding. PICC dressing done. Food and fluids offered. Pt did sit with [place] to be monitored for about 20 min and then insisted on leaving.  He refused any further care in front of two RN's.. we discussed liability and safety concerns. Discussed with pt about not using recreational drugs and his current care treatment -plan pt understood concern but  . Pt walked out on own care to leave. Will discuss with Isidoro Donning Rn.

## 2020-09-21 ENCOUNTER — Inpatient Hospital Stay (HOSPITAL_BASED_OUTPATIENT_CLINIC_OR_DEPARTMENT_OTHER): Payer: Medicaid Other | Admitting: Oncology

## 2020-09-21 ENCOUNTER — Inpatient Hospital Stay: Payer: Medicaid Other

## 2020-09-21 ENCOUNTER — Other Ambulatory Visit: Payer: Self-pay

## 2020-09-21 ENCOUNTER — Other Ambulatory Visit: Payer: Self-pay | Admitting: *Deleted

## 2020-09-21 VITALS — BP 141/98 | HR 65 | Temp 98.0°F | Resp 19 | Ht 73.0 in | Wt 230.4 lb

## 2020-09-21 DIAGNOSIS — C20 Malignant neoplasm of rectum: Secondary | ICD-10-CM

## 2020-09-21 DIAGNOSIS — Z452 Encounter for adjustment and management of vascular access device: Secondary | ICD-10-CM

## 2020-09-21 DIAGNOSIS — Z5111 Encounter for antineoplastic chemotherapy: Secondary | ICD-10-CM | POA: Diagnosis not present

## 2020-09-21 LAB — CMP (CANCER CENTER ONLY)
ALT: 9 U/L (ref 0–44)
AST: 14 U/L — ABNORMAL LOW (ref 15–41)
Albumin: 3.5 g/dL (ref 3.5–5.0)
Alkaline Phosphatase: 95 U/L (ref 38–126)
Anion gap: 8 (ref 5–15)
BUN: 25 mg/dL — ABNORMAL HIGH (ref 6–20)
CO2: 22 mmol/L (ref 22–32)
Calcium: 8 mg/dL — ABNORMAL LOW (ref 8.9–10.3)
Chloride: 110 mmol/L (ref 98–111)
Creatinine: 2.63 mg/dL — ABNORMAL HIGH (ref 0.61–1.24)
GFR, Estimated: 28 mL/min — ABNORMAL LOW (ref >60)
Glucose, Bld: 97 mg/dL (ref 70–99)
Potassium: 3.9 mmol/L (ref 3.5–5.1)
Sodium: 140 mmol/L (ref 135–145)
Total Bilirubin: 0.7 mg/dL (ref 0.3–1.2)
Total Protein: 6.2 g/dL — ABNORMAL LOW (ref 6.5–8.1)

## 2020-09-21 LAB — CBC WITH DIFFERENTIAL (CANCER CENTER ONLY)
Abs Immature Granulocytes: 0.04 10*3/uL (ref 0.00–0.07)
Basophils Absolute: 0.1 10*3/uL (ref 0.0–0.1)
Basophils Relative: 1 %
Eosinophils Absolute: 0.3 10*3/uL (ref 0.0–0.5)
Eosinophils Relative: 4 %
HCT: 30.8 % — ABNORMAL LOW (ref 39.0–52.0)
Hemoglobin: 9.9 g/dL — ABNORMAL LOW (ref 13.0–17.0)
Immature Granulocytes: 1 %
Lymphocytes Relative: 10 %
Lymphs Abs: 0.7 10*3/uL (ref 0.7–4.0)
MCH: 29.6 pg (ref 26.0–34.0)
MCHC: 32.1 g/dL (ref 30.0–36.0)
MCV: 92.2 fL (ref 80.0–100.0)
Monocytes Absolute: 0.9 10*3/uL (ref 0.1–1.0)
Monocytes Relative: 12 %
Neutro Abs: 5.6 10*3/uL (ref 1.7–7.7)
Neutrophils Relative %: 72 %
Platelet Count: 114 10*3/uL — ABNORMAL LOW (ref 150–400)
RBC: 3.34 MIL/uL — ABNORMAL LOW (ref 4.22–5.81)
RDW: 20.6 % — ABNORMAL HIGH (ref 11.5–15.5)
WBC Count: 7.7 10*3/uL (ref 4.0–10.5)
nRBC: 0 % (ref 0.0–0.2)

## 2020-09-21 LAB — CEA (ACCESS): CEA (CHCC): 5.11 ng/mL — ABNORMAL HIGH (ref 0.00–5.00)

## 2020-09-21 LAB — CEA (IN HOUSE-CHCC): CEA (CHCC-In House): 5.23 ng/mL — ABNORMAL HIGH (ref 0.00–5.00)

## 2020-09-21 MED ORDER — DEXTROSE 5 % IV SOLN
Freq: Once | INTRAVENOUS | Status: AC
Start: 2020-09-21 — End: 2020-09-21
  Filled 2020-09-21: qty 250

## 2020-09-21 MED ORDER — HEPARIN SOD (PORK) LOCK FLUSH 100 UNIT/ML IV SOLN
500.0000 [IU] | Freq: Once | INTRAVENOUS | Status: DC
  Filled 2020-09-21: qty 5

## 2020-09-21 MED ORDER — FLUOROURACIL CHEMO INJECTION 2.5 GM/50ML
300.0000 mg/m2 | Freq: Once | INTRAVENOUS | Status: AC
  Administered 2020-09-21: 700 mg via INTRAVENOUS
  Filled 2020-09-21: qty 14

## 2020-09-21 MED ORDER — SODIUM CHLORIDE 0.9% FLUSH
10.0000 mL | Freq: Once | INTRAVENOUS | Status: DC
  Filled 2020-09-21: qty 10

## 2020-09-21 MED ORDER — SODIUM CHLORIDE 0.9 % IV SOLN
10.0000 mg | Freq: Once | INTRAVENOUS | Status: AC
  Administered 2020-09-21: 10 mg via INTRAVENOUS
  Filled 2020-09-21: qty 1

## 2020-09-21 MED ORDER — LEUCOVORIN CALCIUM INJECTION 350 MG
300.0000 mg/m2 | Freq: Once | INTRAVENOUS | Status: AC
  Administered 2020-09-21: 718 mg via INTRAVENOUS
  Filled 2020-09-21: qty 35.9

## 2020-09-21 MED ORDER — PALONOSETRON HCL INJECTION 0.25 MG/5ML
0.2500 mg | Freq: Once | INTRAVENOUS | Status: AC
  Administered 2020-09-21: 0.25 mg via INTRAVENOUS
  Filled 2020-09-21: qty 5

## 2020-09-21 MED ORDER — OXALIPLATIN CHEMO INJECTION 100 MG/20ML
65.0000 mg/m2 | Freq: Once | INTRAVENOUS | Status: AC
  Administered 2020-09-21: 155 mg via INTRAVENOUS
  Filled 2020-09-21: qty 31

## 2020-09-21 MED ORDER — SODIUM CHLORIDE 0.9 % IV SOLN
5000.0000 mg | INTRAVENOUS | Status: DC
  Administered 2020-09-21: 5000 mg via INTRAVENOUS
  Filled 2020-09-21: qty 100

## 2020-09-21 NOTE — Progress Notes (Signed)
  [facility] OFFICE PROGRESS NOTE   Diagnosis: Rectal cancer  INTERVAL HISTORY:   [name] completed another cycle of FOLFOX On 09/07/2020.  No nausea/vomiting, mouth sores, or diarrhea.  He had cold sensitivity following chemotherapy.  No neuropathy symptoms at present.  No difficulty with bowel function.  He is scheduled for follow-up with Dr. Hester Mates on 10/03/2020 Objective:  Vital signs in last 24 hours:  Blood pressure (!) 141/98, pulse 65, temperature 98 F (36.7 C), temperature source Oral, resp. rate 19, height 6\' 1"  (1.854 m), weight 230 lb 6.4 oz (104.5 kg), SpO2 99 %.    HEENT: No thrush or ulcers Resp: Lungs clear bilaterally Cardio: Regular rate and rhythm GI: No hepatosplenomegaly, nontender Vascular: No leg edema Neuro: The vibratory sense is intact at the fingertips bilaterally    Portacath/PICC-without erythema  Lab Results:  Lab Results  Component Value Date   WBC 7.7 09/21/2020   HGB 9.9 (L) 09/21/2020   HCT 30.8 (L) 09/21/2020   MCV 92.2 09/21/2020   PLT 114 (L) 09/21/2020   NEUTROABS 5.6 09/21/2020    CMP  Lab Results  Component Value Date   NA 141 09/07/2020   K 3.5 09/07/2020   CL 107 09/07/2020   CO2 23 09/07/2020   GLUCOSE 128 (H) 09/07/2020   BUN 27 (H) 09/07/2020   CREATININE 2.86 (H) 09/07/2020   CALCIUM 7.0 (L) 09/07/2020   PROT 6.2 (L) 09/07/2020   ALBUMIN 3.4 (L) 09/07/2020   AST 15 09/07/2020   ALT 10 09/07/2020   ALKPHOS 99 09/07/2020   BILITOT 0.5 09/07/2020   GFRNONAA 25 (L) 09/07/2020   GFRAA 15 (L) 01/03/2020    Lab Results  Component Value Date   CEA1 2.53 06/14/2020    Medications: I have reviewed the patient's current medications.   Assessment/Plan: Rectal cancer-rectal mass at 5-6 cm from the anal verge on colonoscopy 01/11/2020, biopsy confirmed adenocarcinoma CTs 01/11/2020-rectal thickening, small perirectal lymph nodes and external iliac nodes, moderate right pleural effusion MRI pelvis  01/27/2020-T3bN2b tumor at 7.1 cm from the internal anal sphincter, multiple mesorectal nodes, indistinct left pelvic sidewall node and enlarged bilateral external iliac nodes PET scan 02/11/2020-hypermetabolic rectosigmoid mass, hypermetabolic mesorectal and sigmoid mesocolon lymph nodes, left external iliac/obturator node with mild hypermetabolism, 18 mm left inguinal node with normal architecture and slight hypermetabolism Radiation and infusional 5-FU 02/21/2020-04/04/2020 CTs 05/09/2020-decreased perirectal and sigmoid mesocolon lymphadenopathy, no evidence of disease progression MRI pelvis 05/17/2020-no well-defined residual rectal mass seen.  Wall appears slightly irregular.  No perirectal extension of tumor.  Again identified is perirectal adenopathy, similar to most recent CT 05/10/2020 but improved compared to PET/CT of 02/11/2020. Cycle 1 FOLFOX 06/14/2020 06/27/2020 chemotherapy held due to hypertension Cycle 2 FOLFOX 06/29/2020 Cycle 3 FOLFOX 07/13/2020 Cycle 4 FOLFOX 07/27/2020 Cycle 5 FOLFOX 08/09/2020 Cycle 6 FOLFOX 08/24/2020 Cycle 7 FOLFOX 09/07/2020 Cycle 8 FOLFOX 09/21/2020 Anemia Chronic renal failure Congestive heart failure-severe LVH Diabetes Gout Peripheral neuropathy? Hypertension    Disposition: [name] appears stable.  He continues to tolerate the chemotherapy well.  He will complete a final cycle of neoadjuvant FOLFOX today.  He is scheduled to see Dr. Hester Mates on 10/03/2020.  He will return for an office and lab visit in 3 weeks.  The PICC will be removed on 09/23/2020.  Betsy Coder, MD  09/21/2020  9:23 AM

## 2020-09-21 NOTE — Progress Notes (Signed)
Dr. Benay Spice requesting PICC line be pulled on Saturday when pump is removed. Order in and left message with nursing assistant director to determine if a staff member would be able to do this. Otherwise will need to be done at [facility] location next week.

## 2020-09-21 NOTE — Patient Instructions (Signed)
[facility]  Discharge Instructions: Thank you for choosing [facility] to provide your oncology and hematology care.   If you have a lab appointment with the [facility], please go directly to the [facility] and check in at the registration area.   Wear comfortable clothing and clothing appropriate for easy access to any Portacath or PICC line.   We strive to give you quality time with your provider. You may need to reschedule your appointment if you arrive late (15 or more minutes).  Arriving late affects you and other patients whose appointments are after yours.  Also, if you miss three or more appointments without notifying the office, you may be dismissed from the clinic at the provider's discretion.      For prescription refill requests, have your pharmacy contact our office and allow 72 hours for refills to be completed.    Today you received the following chemotherapy and/or immunotherapy agents Oxaliplatin, leucovorin, 5 fu      To help prevent nausea and vomiting after your treatment, we encourage you to take your nausea medication as directed.  BELOW ARE SYMPTOMS THAT SHOULD BE REPORTED IMMEDIATELY: *FEVER GREATER THAN 100.4 F (38 C) OR HIGHER *CHILLS OR SWEATING *NAUSEA AND VOMITING THAT IS NOT CONTROLLED WITH YOUR NAUSEA MEDICATION *UNUSUAL SHORTNESS OF BREATH *UNUSUAL BRUISING OR BLEEDING *URINARY PROBLEMS (pain or burning when urinating, or frequent urination) *BOWEL PROBLEMS (unusual diarrhea, constipation, pain near the anus) TENDERNESS IN MOUTH AND THROAT WITH OR WITHOUT PRESENCE OF ULCERS (sore throat, sores in mouth, or a toothache) UNUSUAL RASH, SWELLING OR PAIN  UNUSUAL VAGINAL DISCHARGE OR ITCHING   Items with * indicate a potential emergency and should be followed up as soon as possible or go to the Emergency Department if any problems should occur.  Please show the CHEMOTHERAPY ALERT CARD or IMMUNOTHERAPY ALERT CARD  at check-in to the Emergency Department and triage nurse.  Should you have questions after your visit or need to cancel or reschedule your appointment, please contact [facility]  Dept: [phone]  and follow the prompts.  Office hours are 8:00 a.m. to 4:30 p.m. Monday - Friday. Please note that voicemails left after 4:00 p.m. may not be returned until the following business day.  We are closed weekends and major holidays. You have access to a nurse at all times for urgent questions. Please call the main number to the clinic Dept: [phone] and follow the prompts.   For any non-urgent questions, you may also contact your provider using MyChart. We now offer e-Visits for anyone 26 and older to request care online for non-urgent symptoms. For details visit [email or web address].   Also download the MyChart app! Go to the app store, search "MyChart", open the app, select [facility], and log in with your MyChart username and password.  Due to Covid, a mask is required upon entering the hospital/clinic. If you do not have a mask, one will be given to you upon arrival. For doctor visits, patients may have 1 support person aged 97 or older with them. For treatment visits, patients cannot have anyone with them due to current Covid guidelines and our immunocompromised population.   Oxacillin Injection What is this medication? OXACILLIN (ox a SILL in) is a penicillin antibiotic. It treats some infectionscaused by bacteria. It will not work for colds, the flu, or other viruses. This medicine may be used for other purposes; ask your health care provider orpharmacist  if you have questions. What should I tell my care team before I take this medication? They need to know if you have any of these conditions: asthma kidney disease liver disease an unusual or allergic reaction to oxacillin, other penicillins, cephalosporin antibiotics, foods, dyes, or preservatives pregnant or  trying to get pregnant breast-feeding How should I use this medication? This drug is injected into a muscle or infused into a vein. It is usually [place] a health care provider in a hospital or clinic setting. If you get this drug at home, you will be taught how to prepare and give it. Use exactly as directed. Take it as directed on the prescription label at the same time every day. Keep taking it unless your health care provider tells youto stop. It is important that you put your used needles and syringes in a special sharps container. Do not put them in a trash can. If you do not have a sharpscontainer, call your pharmacist or health care provider to get one. Talk to your health care provider about the use of this drug in children. Whileit may be prescribed for selected conditions, precautions do apply. Overdosage: If you think you have taken too much of this medicine contact apoison control center or emergency room at once. NOTE: This medicine is only for you. Do not share this medicine with others. What if I miss a dose? It is important not to miss your dose. Call your health care provider if you are unable to keep an appointment. If you give yourself this drug at home and you miss a dose, take it as soon as you can. If it is almost time for your nextdose, take only that dose. Do not take double or extra doses. What may interact with this medication? birth control pills probenecid some antibiotics like doxycycline, minocycline, tetracycline This list may not describe all possible interactions. Give your health care provider a list of all the medicines, herbs, non-prescription drugs, or dietary supplements you use. Also tell them if you smoke, drink alcohol, or use illegaldrugs. Some items may interact with your medicine. What should I watch for while using this medication? Your condition will be monitored carefully while you are receiving thismedicine. This medicine may cause serious skin  reactions. They can happen weeks to months after starting the medicine. Contact your health care provider right away if you notice fevers or flu-like symptoms with a rash. The rash may be red or purple and then turn into blisters or peeling of the skin. Or, you might notice a red rash with swelling of the face, lips or lymph nodes in your neck or underyour arms. Do not treat diarrhea with over the counter products. Contact your doctor ifyou have diarrhea that lasts more than 2 days or if it is severe and watery. This medicine can interfere with some urine glucose tests. If you use suchtests, talk with your health care provider. What side effects may I notice from receiving this medication? Side effects that you should report to your doctor or health care professionalas soon as possible: allergic reactions like skin rash, itching or hives, swelling of the face, lips, or tongue breathing problems fever redness, blistering, peeling or loosening of the skin, including inside the mouth seizures stomach cramps trouble passing urine or change in the amount of urine unusual bleeding or bruising unusually weak or tired Side effects that usually do not require medical attention (report to yourdoctor or health care professional if they continue or are bothersome):  diarrhea headache stomach upset This list may not describe all possible side effects. Call your doctor for medical advice about side effects. You may report side effects to FDA at1-800-FDA-1088. Where should I keep my medication? Keep out of the reach of children and pets. You will be instructed on how to store this drug. Throw away any unused drugafter the expiration date. NOTE: This sheet is a summary. It may not cover all possible information. If you have questions about this medicine, talk to your doctor, pharmacist, orhealth care provider.  2022 Elsevier/Gold Standard (2018-10-20 10:07:54)  Leucovorin injection What is this  medication? LEUCOVORIN (loo koe VOR in) is used to prevent or treat the harmful effects of some medicines. This medicine is used to treat anemia caused by a low amount of folic acid in the body. It is also used with 5-fluorouracil (5-FU) to treatcolon cancer. This medicine may be used for other purposes; ask your health care provider orpharmacist if you have questions. What should I tell my care team before I take this medication? They need to know if you have any of these conditions: anemia from low levels of vitamin B-12 in the blood an unusual or allergic reaction to leucovorin, folic acid, other medicines, foods, dyes, or preservatives pregnant or trying to get pregnant breast-feeding How should I use this medication? This medicine is for injection into a muscle or into a vein. It is given by ahealth care professional in a hospital or clinic setting. Talk to your pediatrician regarding the use of this medicine in children.Special care may be needed. Overdosage: If you think you have taken too much of this medicine contact apoison control center or emergency room at once. NOTE: This medicine is only for you. Do not share this medicine with others. What if I miss a dose? This does not apply. What may interact with this medication? capecitabine fluorouracil phenobarbital phenytoin primidone trimethoprim-sulfamethoxazole This list may not describe all possible interactions. Give your health care provider a list of all the medicines, herbs, non-prescription drugs, or dietary supplements you use. Also tell them if you smoke, drink alcohol, or use illegaldrugs. Some items may interact with your medicine. What should I watch for while using this medication? Your condition will be monitored carefully while you are receiving thismedicine. This medicine may increase the side effects of 5-fluorouracil, 5-FU. Tell your doctor or health care professional if you have diarrhea or mouth sores that donot  get better or that get worse. What side effects may I notice from receiving this medication? Side effects that you should report to your doctor or health care professionalas soon as possible: allergic reactions like skin rash, itching or hives, swelling of the face, lips, or tongue breathing problems fever, infection mouth sores unusual bleeding or bruising unusually weak or tired Side effects that usually do not require medical attention (report to yourdoctor or health care professional if they continue or are bothersome): constipation or diarrhea loss of appetite nausea, vomiting This list may not describe all possible side effects. Call your doctor for medical advice about side effects. You may report side effects to FDA at1-800-FDA-1088. Where should I keep my medication? This drug is given in a hospital or clinic and will not be stored at home. NOTE: This sheet is a summary. It may not cover all possible information. If you have questions about this medicine, talk to your doctor, pharmacist, orhealth care provider.  2022 Elsevier/Gold Standard (2007-09-22 16:50:29)   Fluorouracil, 5-FU injection What is this  medication? FLUOROURACIL, 5-FU (flure oh YOOR a sil) is a chemotherapy drug. It slows the growth of cancer cells. This medicine is used to treat many types of cancer like breast cancer, colon or rectal cancer, pancreatic cancer, and stomachcancer. This medicine may be used for other purposes; ask your health care provider orpharmacist if you have questions. COMMON BRAND NAME(S): Adrucil What should I tell my care team before I take this medication? They need to know if you have any of these conditions: blood disorders dihydropyrimidine dehydrogenase (DPD) deficiency infection (especially a virus infection such as chickenpox, cold sores, or herpes) kidney disease liver disease malnourished, poor nutrition recent or ongoing radiation therapy an unusual or allergic reaction  to fluorouracil, other chemotherapy, other medicines, foods, dyes, or preservatives pregnant or trying to get pregnant breast-feeding How should I use this medication? This drug is given as an infusion or injection into a vein. It is administeredin a hospital or clinic by a specially trained health care professional. Talk to your pediatrician regarding the use of this medicine in children.Special care may be needed. Overdosage: If you think you have taken too much of this medicine contact apoison control center or emergency room at once. NOTE: This medicine is only for you. Do not share this medicine with others. What if I miss a dose? It is important not to miss your dose. Call your doctor or health careprofessional if you are unable to keep an appointment. What may interact with this medication? Do not take this medicine with any of the following medications: live virus vaccines This medicine may also interact with the following medications: medicines that treat or prevent blood clots like warfarin, enoxaparin, and dalteparin This list may not describe all possible interactions. Give your health care provider a list of all the medicines, herbs, non-prescription drugs, or dietary supplements you use. Also tell them if you smoke, drink alcohol, or use illegaldrugs. Some items may interact with your medicine. What should I watch for while using this medication? Visit your doctor for checks on your progress. This drug may make you feel generally unwell. This is not uncommon, as chemotherapy can affect healthy cells as well as cancer cells. Report any side effects. Continue your course oftreatment even though you feel ill unless your doctor tells you to stop. In some cases, you may be given additional medicines to help with side effects.Follow all directions for their use. Call your doctor or health care professional for advice if you get a fever, chills or sore throat, or other symptoms of a cold or  flu. Do not treat yourself. This drug decreases your body's ability to fight infections. Try toavoid being around people who are sick. This medicine may increase your risk to bruise or bleed. Call your doctor orhealth care professional if you notice any unusual bleeding. Be careful brushing and flossing your teeth or using a toothpick because you may get an infection or bleed more easily. If you have any dental work done,tell your dentist you are receiving this medicine. Avoid taking products that contain aspirin, acetaminophen, ibuprofen, naproxen, or ketoprofen unless instructed by your doctor. These medicines may hide afever. Do not become pregnant while taking this medicine. Women should inform their doctor if they wish to become pregnant or think they might be pregnant. There is a potential for serious side effects to an unborn child. Talk to your health care professional or pharmacist for more information. Do not breast-feed aninfant while taking this medicine. Men should inform their  doctor if they wish to father a child. This medicinemay lower sperm counts. Do not treat diarrhea with over the counter products. Contact your doctor ifyou have diarrhea that lasts more than 2 days or if it is severe and watery. This medicine can make you more sensitive to the sun. Keep out of the sun. If you cannot avoid being in the sun, wear protective clothing and use sunscreen.Do not use sun lamps or tanning beds/booths. What side effects may I notice from receiving this medication? Side effects that you should report to your doctor or health care professionalas soon as possible: allergic reactions like skin rash, itching or hives, swelling of the face, lips, or tongue low blood counts - this medicine may decrease the number of white blood cells, red blood cells and platelets. You may be at increased risk for infections and bleeding. signs of infection - fever or chills, cough, sore throat, pain or difficulty  passing urine signs of decreased platelets or bleeding - bruising, pinpoint red spots on the skin, black, tarry stools, blood in the urine signs of decreased red blood cells - unusually weak or tired, fainting spells, lightheadedness breathing problems changes in vision chest pain mouth sores nausea and vomiting pain, swelling, redness at site where injected pain, tingling, numbness in the hands or feet redness, swelling, or sores on hands or feet stomach pain unusual bleeding Side effects that usually do not require medical attention (report to yourdoctor or health care professional if they continue or are bothersome): changes in finger or toe nails diarrhea dry or itchy skin hair loss headache loss of appetite sensitivity of eyes to the light stomach upset unusually teary eyes This list may not describe all possible side effects. Call your doctor for medical advice about side effects. You may report side effects to FDA at1-800-FDA-1088. Where should I keep my medication? This drug is given in a hospital or clinic and will not be stored at home. NOTE: This sheet is a summary. It may not cover all possible information. If you have questions about this medicine, talk to your doctor, pharmacist, orhealth care provider.  2022 Elsevier/Gold Standard (2019-02-16 15:00:03)

## 2020-09-23 ENCOUNTER — Other Ambulatory Visit: Payer: Self-pay

## 2020-09-23 ENCOUNTER — Inpatient Hospital Stay: Payer: Medicaid Other

## 2020-09-23 VITALS — BP 203/134 | HR 80 | Temp 97.6°F | Resp 18

## 2020-09-23 DIAGNOSIS — Z5111 Encounter for antineoplastic chemotherapy: Secondary | ICD-10-CM | POA: Diagnosis not present

## 2020-09-23 DIAGNOSIS — C20 Malignant neoplasm of rectum: Secondary | ICD-10-CM

## 2020-09-23 MED ORDER — HEPARIN SOD (PORK) LOCK FLUSH 100 UNIT/ML IV SOLN
250.0000 [IU] | Freq: Once | INTRAVENOUS | Status: AC | PRN
  Administered 2020-09-23: 250 [IU]
  Filled 2020-09-23: qty 5

## 2020-09-23 MED ORDER — SODIUM CHLORIDE 0.9% FLUSH
10.0000 mL | INTRAVENOUS | Status: DC | PRN
  Administered 2020-09-23: 10 mL
  Filled 2020-09-23: qty 10

## 2020-09-23 NOTE — Patient Instructions (Signed)
[facility]  Discharge Instructions: Thank you for choosing [facility] to provide your oncology and hematology care.   If you have a lab appointment with the [facility], please go directly to the [facility] and check in at the registration area.   Wear comfortable clothing and clothing appropriate for easy access to any Portacath or PICC line.   We strive to give you quality time with your provider. You may need to reschedule your appointment if you arrive late (15 or more minutes).  Arriving late affects you and other patients whose appointments are after yours.  Also, if you miss three or more appointments without notifying the office, you may be dismissed from the clinic at the provider's discretion.      For prescription refill requests, have your pharmacy contact our office and allow 72 hours for refills to be completed.    Today you received the following chemotherapy and/or immunotherapy agents 73fu      To help prevent nausea and vomiting after your treatment, we encourage you to take your nausea medication as directed.  BELOW ARE SYMPTOMS THAT SHOULD BE REPORTED IMMEDIATELY: *FEVER GREATER THAN 100.4 F (38 C) OR HIGHER *CHILLS OR SWEATING *NAUSEA AND VOMITING THAT IS NOT CONTROLLED WITH YOUR NAUSEA MEDICATION *UNUSUAL SHORTNESS OF BREATH *UNUSUAL BRUISING OR BLEEDING *URINARY PROBLEMS (pain or burning when urinating, or frequent urination) *BOWEL PROBLEMS (unusual diarrhea, constipation, pain near the anus) TENDERNESS IN MOUTH AND THROAT WITH OR WITHOUT PRESENCE OF ULCERS (sore throat, sores in mouth, or a toothache) UNUSUAL RASH, SWELLING OR PAIN  UNUSUAL VAGINAL DISCHARGE OR ITCHING   Items with * indicate a potential emergency and should be followed up as soon as possible or go to the Emergency Department if any problems should occur.  Please show the CHEMOTHERAPY ALERT CARD or IMMUNOTHERAPY ALERT CARD at check-in to the  Emergency Department and triage nurse.  Should you have questions after your visit or need to cancel or reschedule your appointment, please contact [facility]  Dept: [phone]  and follow the prompts.  Office hours are 8:00 a.m. to 4:30 p.m. Monday - Friday. Please note that voicemails left after 4:00 p.m. may not be returned until the following business day.  We are closed weekends and major holidays. You have access to a nurse at all times for urgent questions. Please call the main number to the clinic Dept: [phone] and follow the prompts.   For any non-urgent questions, you may also contact your provider using MyChart. We now offer e-Visits for anyone 67 and older to request care online for non-urgent symptoms. For details visit [email or web address].   Also download the MyChart app! Go to the app store, search "MyChart", open the app, select [facility], and log in with your MyChart username and password.  Due to Covid, a mask is required upon entering the hospital/clinic. If you do not have a mask, one will be given to you upon arrival. For doctor visits, patients may have 1 support person aged 73 or older with them. For treatment visits, patients cannot have anyone with them due to current Covid guidelines and our immunocompromised population.

## 2020-09-25 ENCOUNTER — Other Ambulatory Visit: Payer: Self-pay

## 2020-09-25 ENCOUNTER — Inpatient Hospital Stay: Payer: Medicaid Other

## 2020-09-25 NOTE — Patient Instructions (Signed)
PICC Removal, Adult, Care After This sheet gives you information about how to care for yourself after your procedure. Your health care provider may also give you more specific instructions. If you have problems or questions, contact your health careprovider. What can I expect after the procedure? After your procedure, it is common to have: Tenderness or soreness. Redness, swelling, or a scab where the PICC was removed (exit site). Follow these instructions at home: For the first 24 hours after the procedure Keep the bandage (dressing) on the exit site clean and dry. Do not remove the dressing until your health care provider tells you to do so. Check your arm often for signs and symptoms of an infection. Check for: A red streak that spreads away from the dressing. Blood or fluid that you can see on the dressing. More redness or swelling. Do not lift anything heavy or do activities that require great effort until your health care provider says it is okay. You should avoid: Lifting weights. Yard work. Any physical activity with repetitive arm movement. Watch closely for any signs of an air bubble in the vein (air embolism). This is a rare but serious complication. If you have signs of air embolism, call 911 immediately and lie down on your left side to keep the air from moving into the lungs. Signs of an air embolism include: Difficulty breathing. Chest pain. Coughing or wheezing. Skin that is pale, blue, cold, or clammy. Rapid pulse. Rapid breathing. Fainting. After 24 Hours have passed:  Remove your dressing as told by your health care provider. Make sure you wash your hands with soap and water before and after you change the dressing. If soap and water are not available, use hand sanitizer. Return to your normal activities as told by your health care provider. A small scab may develop over the exit site. Do not pick at the scab. When bathing or showering, gently wash the exit site with  soap and water. Pat it dry. Watch for signs of infection, such as: Fever or chills. Swollen glands under the arm. More redness, swelling, or soreness in the arm. Blood, fluid, or pus coming from the exit site. Warmth or a bad smell at the exit site. A red streak spreading away from the exit site.  General instructions Take over-the-counter and prescription medicines only as told by your health care provider. Do not take any new medicines without checking with your health care provider first. If you were prescribed an antibiotic medicine, apply or take it as told by your health care provider. Do not stop using the antibiotic even if your condition improves. Keep all follow-up visits as told by your health care provider. This is important. Contact a health care provider if: You have a fever or chills. You have soreness, redness, or swelling on your exit site, and it gets worse. You have swollen glands under your arm. You have any of the following symptoms at your exit site: Blood, fluid, or pus. Unusual warmth. A bad smell. A red streak spreading away from the exit site. Get help right away if: You have numbness or tingling in your fingers, hand, or arm. Your arm looks blue and feels cold. You have signs of an air embolism, such as: Difficulty breathing. Chest pain. Coughing or wheezing. Skin that is pale, blue, cold, or clammy. Rapid pulse. Rapid breathing. Fainting. These symptoms may represent a serious problem that is an emergency. Do not wait to see if the symptoms will go away. Get  medical help right away. Call your local emergency services (911 in the [place] Do not drive yourself to the hospital. Summary After your procedure, it is common to have tenderness or soreness, redness, swelling, or a scab at the exit site. Keep the dressing over the exit site clean and dry. Do not remove the dressing until your health care provider tells you to do so. Do not lift anything heavy or  do activities that require great effort until your health care provider says it is okay. Watch closely for any signs of an air embolism. If you have signs of air embolism, call 911 immediately and lie down on your left side. This information is not intended to replace advice given to you by your health care provider. Make sure you discuss any questions you have with your healthcare provider. Document Revised: 07/28/2019 Document Reviewed: 07/28/2019 Elsevier Patient Education  2022 [employer].

## 2020-09-27 ENCOUNTER — Telehealth: Payer: Self-pay

## 2020-09-27 NOTE — Telephone Encounter (Signed)
V/M message from Kennyth Lose [phone] with Dr. Zollie Scale office at [facility] stating Dr Hester Mates would like Pt to receive a MRI but Pt would like the MRI to be done at [facility]. Return call to Dr Zollie Scale office to inform them that Dr Benay Spice will assist in ordering MRI and Dr Hester Mates' office will send the order over fax number given awaiting order from Dr Zollie Scale office.

## 2020-09-28 ENCOUNTER — Telehealth: Payer: Self-pay

## 2020-09-28 NOTE — Telephone Encounter (Signed)
Orders received from Dr Zollie Scale office. Orders given to Dr Benay Spice.

## 2020-09-29 ENCOUNTER — Other Ambulatory Visit: Payer: Self-pay

## 2020-09-29 ENCOUNTER — Telehealth: Payer: Self-pay | Admitting: Family Medicine

## 2020-09-29 ENCOUNTER — Encounter: Payer: Self-pay | Admitting: Oncology

## 2020-09-29 ENCOUNTER — Other Ambulatory Visit: Payer: Self-pay | Admitting: Pharmacist

## 2020-09-29 MED ORDER — COLCHICINE 0.6 MG PO CAPS
ORAL_CAPSULE | ORAL | 0 refills | Status: DC
  Filled 2020-09-29: qty 10, 5d supply, fill #0

## 2020-09-29 NOTE — Telephone Encounter (Signed)
Copied from [place] [phone]. Topic: General - Other >> Sep 29, 2020  9:58 AM Leward Quan A wrote: Reason for CRM: Patient called in asking to speak to Dr Margarita Rana say that he was told by the pharmacy that his Rx for colchicine 0.6 MG tablet is more money than he can afford. Please call patient at Ph#  [phone]

## 2020-10-03 ENCOUNTER — Other Ambulatory Visit: Payer: Self-pay | Admitting: Nurse Practitioner

## 2020-10-03 DIAGNOSIS — C20 Malignant neoplasm of rectum: Secondary | ICD-10-CM

## 2020-10-04 ENCOUNTER — Other Ambulatory Visit: Payer: Self-pay

## 2020-10-06 ENCOUNTER — Other Ambulatory Visit: Payer: Self-pay

## 2020-10-09 ENCOUNTER — Ambulatory Visit (HOSPITAL_COMMUNITY): Payer: Medicaid Other

## 2020-10-10 ENCOUNTER — Other Ambulatory Visit: Payer: Medicaid Other

## 2020-10-10 ENCOUNTER — Inpatient Hospital Stay: Payer: Medicaid Other | Attending: Oncology

## 2020-10-10 ENCOUNTER — Other Ambulatory Visit: Payer: Self-pay

## 2020-10-10 ENCOUNTER — Inpatient Hospital Stay (HOSPITAL_BASED_OUTPATIENT_CLINIC_OR_DEPARTMENT_OTHER): Payer: Medicaid Other | Admitting: Nurse Practitioner

## 2020-10-10 ENCOUNTER — Encounter: Payer: Self-pay | Admitting: Nurse Practitioner

## 2020-10-10 VITALS — BP 183/133 | HR 60 | Temp 97.8°F | Resp 18 | Ht 73.0 in | Wt 228.0 lb

## 2020-10-10 DIAGNOSIS — C20 Malignant neoplasm of rectum: Secondary | ICD-10-CM | POA: Insufficient documentation

## 2020-10-10 DIAGNOSIS — M109 Gout, unspecified: Secondary | ICD-10-CM | POA: Diagnosis not present

## 2020-10-10 DIAGNOSIS — N189 Chronic kidney disease, unspecified: Secondary | ICD-10-CM | POA: Diagnosis not present

## 2020-10-10 DIAGNOSIS — I130 Hypertensive heart and chronic kidney disease with heart failure and stage 1 through stage 4 chronic kidney disease, or unspecified chronic kidney disease: Secondary | ICD-10-CM | POA: Diagnosis not present

## 2020-10-10 DIAGNOSIS — E1122 Type 2 diabetes mellitus with diabetic chronic kidney disease: Secondary | ICD-10-CM | POA: Diagnosis not present

## 2020-10-10 DIAGNOSIS — D649 Anemia, unspecified: Secondary | ICD-10-CM | POA: Insufficient documentation

## 2020-10-10 LAB — CBC WITH DIFFERENTIAL (CANCER CENTER ONLY)
Abs Immature Granulocytes: 0.03 10*3/uL (ref 0.00–0.07)
Basophils Absolute: 0 10*3/uL (ref 0.0–0.1)
Basophils Relative: 1 %
Eosinophils Absolute: 0.2 10*3/uL (ref 0.0–0.5)
Eosinophils Relative: 3 %
HCT: 35.3 % — ABNORMAL LOW (ref 39.0–52.0)
Hemoglobin: 11.2 g/dL — ABNORMAL LOW (ref 13.0–17.0)
Immature Granulocytes: 1 %
Lymphocytes Relative: 11 %
Lymphs Abs: 0.6 10*3/uL — ABNORMAL LOW (ref 0.7–4.0)
MCH: 31.2 pg (ref 26.0–34.0)
MCHC: 31.7 g/dL (ref 30.0–36.0)
MCV: 98.3 fL (ref 80.0–100.0)
Monocytes Absolute: 0.9 10*3/uL (ref 0.1–1.0)
Monocytes Relative: 17 %
Neutro Abs: 3.6 10*3/uL (ref 1.7–7.7)
Neutrophils Relative %: 67 %
Platelet Count: 211 10*3/uL (ref 150–400)
RBC: 3.59 MIL/uL — ABNORMAL LOW (ref 4.22–5.81)
RDW: 20.3 % — ABNORMAL HIGH (ref 11.5–15.5)
WBC Count: 5.3 10*3/uL (ref 4.0–10.5)
nRBC: 0 % (ref 0.0–0.2)

## 2020-10-10 LAB — CEA (IN HOUSE-CHCC): CEA (CHCC-In House): 4.4 ng/mL (ref 0.00–5.00)

## 2020-10-10 LAB — CMP (CANCER CENTER ONLY)
ALT: 9 U/L (ref 0–44)
AST: 16 U/L (ref 15–41)
Albumin: 3.6 g/dL (ref 3.5–5.0)
Alkaline Phosphatase: 106 U/L (ref 38–126)
Anion gap: 10 (ref 5–15)
BUN: 24 mg/dL — ABNORMAL HIGH (ref 6–20)
CO2: 25 mmol/L (ref 22–32)
Calcium: 8.1 mg/dL — ABNORMAL LOW (ref 8.9–10.3)
Chloride: 107 mmol/L (ref 98–111)
Creatinine: 2.63 mg/dL — ABNORMAL HIGH (ref 0.61–1.24)
GFR, Estimated: 28 mL/min — ABNORMAL LOW (ref >60)
Glucose, Bld: 149 mg/dL — ABNORMAL HIGH (ref 70–99)
Potassium: 3.8 mmol/L (ref 3.5–5.1)
Sodium: 142 mmol/L (ref 135–145)
Total Bilirubin: 0.5 mg/dL (ref 0.3–1.2)
Total Protein: 6.7 g/dL (ref 6.5–8.1)

## 2020-10-10 LAB — CEA (ACCESS): CEA (CHCC): 4.63 ng/mL (ref 0.00–5.00)

## 2020-10-10 NOTE — Progress Notes (Signed)
  [place] OFFICE PROGRESS NOTE   Diagnosis: Rectal cancer  INTERVAL HISTORY:   [name] returns as scheduled.  He completed cycle 8 FOLFOX 09/21/2020.  He denies nausea/vomiting.  He has intermittent numbness/tingling in the feet.  He did not take his blood pressure medication today.  Objective:  Vital signs in last 24 hours:  Blood pressure (!) 183/133, pulse 60, temperature 97.8 F (36.6 C), temperature source Oral, resp. rate 18, height 6\' 1"  (1.854 m), weight 228 lb (103.4 kg), SpO2 100 %.    HEENT: No thrush or ulcers. Resp: Lungs clear bilaterally. Cardio: Regular rate and rhythm. GI: Abdomen soft and nontender.  No hepatomegaly. Vascular: No leg edema. Skin: Palms with hyperpigmentation, dry appearing.   Lab Results:  Lab Results  Component Value Date   WBC 5.3 10/10/2020   HGB 11.2 (L) 10/10/2020   HCT 35.3 (L) 10/10/2020   MCV 98.3 10/10/2020   PLT 211 10/10/2020   NEUTROABS 3.6 10/10/2020    Imaging:  No results found.  Medications: I have reviewed the patient's current medications.  Assessment/Plan: Rectal cancer-rectal mass at 5-6 cm from the anal verge on colonoscopy 01/11/2020, biopsy confirmed adenocarcinoma CTs 01/11/2020-rectal thickening, small perirectal lymph nodes and external iliac nodes, moderate right pleural effusion MRI pelvis 01/27/2020-T3bN2b tumor at 7.1 cm from the internal anal sphincter, multiple mesorectal nodes, indistinct left pelvic sidewall node and enlarged bilateral external iliac nodes PET scan 02/11/2020-hypermetabolic rectosigmoid mass, hypermetabolic mesorectal and sigmoid mesocolon lymph nodes, left external iliac/obturator node with mild hypermetabolism, 18 mm left inguinal node with normal architecture and slight hypermetabolism Radiation and infusional 5-FU 02/21/2020-04/04/2020 CTs 05/09/2020-decreased perirectal and sigmoid mesocolon lymphadenopathy, no evidence of disease progression MRI pelvis  05/17/2020-no well-defined residual rectal mass seen.  Wall appears slightly irregular.  No perirectal extension of tumor.  Again identified is perirectal adenopathy, similar to most recent CT 05/10/2020 but improved compared to PET/CT of 02/11/2020. Cycle 1 FOLFOX 06/14/2020 06/27/2020 chemotherapy held due to hypertension Cycle 2 FOLFOX 06/29/2020 Cycle 3 FOLFOX 07/13/2020 Cycle 4 FOLFOX 07/27/2020 Cycle 5 FOLFOX 08/09/2020 Cycle 6 FOLFOX 08/24/2020 Cycle 7 FOLFOX 09/07/2020 Cycle 8 FOLFOX 09/21/2020 MRI scheduled 10/16/2020 Follow-up appointment with Dr. Hester Mates 10/31/2020 Anemia Chronic renal failure Congestive heart failure-severe LVH Diabetes Gout Peripheral neuropathy? Hypertension  Disposition: Craig West has completed the course of neoadjuvant FOLFOX.  He is scheduled for an MRI of the pelvis 10/16/2020.  He has an appointment with Dr. Hester Mates on 10/31/2020.  He will return for a follow-up visit here in approximately 2 months.  We are available to see him sooner if needed.  He is intermittently compliant with his medications.  He plans to take his blood pressure medication upon returning home today.  Plan reviewed with Dr. Benay Spice.    [name] ANP/GNP-BC   10/10/2020  10:52 AM

## 2020-10-12 ENCOUNTER — Other Ambulatory Visit: Payer: Medicaid Other

## 2020-10-12 ENCOUNTER — Ambulatory Visit: Payer: Medicaid Other | Admitting: Oncology

## 2020-10-16 ENCOUNTER — Other Ambulatory Visit: Payer: Self-pay

## 2020-10-16 ENCOUNTER — Other Ambulatory Visit: Payer: Self-pay | Admitting: Nurse Practitioner

## 2020-10-16 ENCOUNTER — Ambulatory Visit (HOSPITAL_COMMUNITY)
Admission: RE | Admit: 2020-10-16 | Discharge: 2020-10-16 | Disposition: A | Discharge status: Home / Self Care | Payer: Medicaid Other | Source: Ambulatory Visit | Attending: Nurse Practitioner | Admitting: Nurse Practitioner

## 2020-10-16 DIAGNOSIS — C20 Malignant neoplasm of rectum: Secondary | ICD-10-CM

## 2020-11-16 ENCOUNTER — Other Ambulatory Visit: Payer: Self-pay

## 2020-11-16 MED FILL — Glipizide Tab 5 MG: ORAL | 90 days supply | Qty: 45 | Fill #1 | Status: AC

## 2020-11-16 MED FILL — Hydralazine HCl Tab 50 MG: ORAL | 90 days supply | Qty: 90 | Fill #1 | Status: AC

## 2020-11-16 MED FILL — Amlodipine Besylate Tab 5 MG (Base Equivalent): ORAL | 30 days supply | Qty: 60 | Fill #1 | Status: AC

## 2020-11-20 ENCOUNTER — Other Ambulatory Visit: Payer: Self-pay

## 2020-11-20 MED ORDER — METRONIDAZOLE 500 MG PO TABS
ORAL_TABLET | ORAL | 0 refills | Status: DC
  Filled 2020-11-20: qty 3, 1d supply, fill #0

## 2020-11-20 MED ORDER — NEOMYCIN SULFATE 500 MG PO TABS
ORAL_TABLET | ORAL | 0 refills | Status: DC
  Filled 2020-11-20: qty 6, 1d supply, fill #0

## 2020-11-21 ENCOUNTER — Other Ambulatory Visit: Payer: Self-pay

## 2020-11-21 ENCOUNTER — Telehealth: Payer: Self-pay

## 2020-11-21 NOTE — Telephone Encounter (Signed)
   Patient Name: Craig West  DOB: 1965/06/29 MRN: [id number]  Primary Cardiologist: Skeet Latch, MD  Chart reviewed as part of pre-operative protocol coverage. Of note I believe clearance is meant to say colostomy not colonoscopy. Patient was last seen in clinic 07/2020 with recommendation to f/u in 10/2020. He has an appointment 2 days from now at which time surgical clearance can be addressed. Added surgical clearance modifier to appt notes and will cc to MD so they are aware. Per office protocol, the provider should assess clearance at time of office visit and should forward their finalized clearance decision to requesting party below. I will remove this message from the pre-op box. No blood thinners listed in our current [facility] for patient.  Will route this msg to requesting surgeon so they are aware patient has preop appt.  Charlie Pitter, PA-C 11/21/2020, 3:56 PM

## 2020-11-21 NOTE — Telephone Encounter (Signed)
   [facility] Pre-operative Risk Assessment    Patient Name: Craig West  DOB: 1965/10/26 MRN: [id number]  HEARTCARE STAFF:  - IMPORTANT!!!!!! Under Visit Info/Reason for Call, type in Other and utilize the format Clearance MM/DD/YY or Clearance TBD. Do not use dashes or single digits. - Please review there is not already an duplicate clearance open for this procedure. - If request is for dental extraction, please clarify the # of teeth to be extracted. - If the patient is currently at the dentist's office, call Pre-Op Callback Staff (MA/nurse) to input urgent request.  - If the patient is not currently in the dentist office, please route to the Pre-Op pool.  Request for surgical clearance:  What type of surgery is being performed? Open Low Anterior Resection with Diverting Loop Colonoscopy   When is this surgery scheduled? 12/06/20  What type of clearance is required (medical clearance vs. Pharmacy clearance to hold med vs. Both)? BOTH   Are there any medications that need to be held prior to surgery and how long? N/A  Practice name and name of physician performing surgery? Gertie Baron, NP  What is the office phone number? [phone]   7.   What is the office fax number? [phone]  8.   Anesthesia type (None, local, MAC, general) ? Not listed   Belinda Block Sheilyn Boehlke 11/21/2020, 3:43 PM  _________________________________________________________________   (provider comments below)

## 2020-11-22 DIAGNOSIS — I4510 Unspecified right bundle-branch block: Secondary | ICD-10-CM | POA: Diagnosis not present

## 2020-11-22 DIAGNOSIS — G4730 Sleep apnea, unspecified: Secondary | ICD-10-CM | POA: Diagnosis not present

## 2020-11-22 DIAGNOSIS — Z01818 Encounter for other preprocedural examination: Secondary | ICD-10-CM | POA: Diagnosis not present

## 2020-11-22 DIAGNOSIS — Z7189 Other specified counseling: Secondary | ICD-10-CM | POA: Diagnosis not present

## 2020-11-22 DIAGNOSIS — C20 Malignant neoplasm of rectum: Secondary | ICD-10-CM | POA: Diagnosis not present

## 2020-11-22 NOTE — Progress Notes (Signed)
Cardiology Office Visit:    Date:  11/23/2020   ID:  Ranvir Renovato, DOB [birth year], MRN [id number]  PCP:  Charlott Rakes, MD  Cardiologist:  Skeet Latch, MD  Nephrologist:  Referring MD: Charlott Rakes, MD   CC: Hypertension  History of Present Illness:    Craig West is a 55 y.o. male with a hx of hypertension, diabetes, CKD IV, rectal carcinoma, and asthma  here for follow up.  He was initially seen to establish care in the advanced hypertension clinic. Mr. Bounds reports having hypertension for at least 2 years.  He has struggled to afford his medications due to lack of insurance and lack of income.  He was admitted to the hospital 12/2019 with hypertensive emergency.  He presented to the hospital with subacute onset of shortness of breath and edema.  He reported several nights of orthopnea.  He initially thought that his edema was attributable to gout.  In the ED his blood pressure was 263/163.  He was given labetalol and IV hydralazine.  His home antihypertensives were subsequently resumed (amlodipine, hydrochlorothiazide, and lisinopril).  An echocardiogram was obtained that revealed LVEF 60 to 65% with severe LVH.  Left ventricular wall thickness was 1.8 to 1.9 cm.  Cardiology was consulted due to concern for possible infiltrative cardiomyopathy.  He denies any palpitations and has no family history of sudden cardiac death.  There were some short episodes of NSVT.  High-sensitivity troponin was elevated to 218.  His home lisinopril and hydrochlorothiazide were discontinued due to his CKD 4.  He was diuresed with IV Lasix and this was switched to oral at discharge.  He was started on carvedilol and hydralazine.  His weight at the time of discharge was 232 pounds.  Mr. Robles saw his PCP on 10/4.  At that time he was hypotensive to 84/60.  Amlodipine was reduced to 5 mg.  His weight had increased to 260 pounds.  Labs were obtained and BUN was 84 with a creatinine increased to 4.79.   Potassium was 5.9. Since that time he was also diagnosed with rectal carcinoma and completed for Chemo and XRT.  He completed 8 rounds of FOLFOX.    He followed-up with Coletta Memos, NP on 08/01/2020. Mr. Maimone reported at home blood pressures averaging 115/70-80s, but was 128/86 in clinic. He was asked to increase physical activity and maintain a blood pressure log. He presented to the ED 08/13/2020 with generalized weakness and body aches, with fevers and chills earlier that day. CXR showed mild cardiac enlargement with central vascular prominence. CT scan of abdomen showed mild wall thickening with hazy stranding about the bladder, so Keflex was started. Labs were about baseline, and he was negative for COVID and flu. He was stable at discharge.   Mr. Matsuura needs surgical clearance for an open low anterior resection with diverting loop colostomy, scheduled to be performed 12/06/2020. Today, he is concerned that his blood pressure will not be under control for his upcoming procedure. This morning he took his medicine, but his blood pressure was elevated yesterday and today at clinic visits. He does not check his blood pressure at home. In the past month, he has gained about 8 pounds. For his diet, he has not been monitoring his salt intake. Mostly he will eat eggs, shrimp, but not many fruits or vegetables. After his previous chemotherapy he notes his taste buds have changed, and no longer enjoys eating cheeseburgers or fast food. For exercise he will walk sometimes, but  he gets fatigued. After half an hour he feels heaviness in his lower extremities. Beginning about a month ago, after climbing the stairs and entering his apartment he needs to sit and rest due to fatigue. He denies any shortness of breath. While working he is typically driving most of the day. He denies any palpitations, or chest pain. No lightheadedness, headaches, syncope, orthopnea, or PND. Also has no lower extremity edema.   Past Medical  History:  Diagnosis Date   Asthma    CHF (congestive heart failure) ([place])    CKD (chronic kidney disease), stage IV ([place])    COVID-19 virus infection 04/2019   Diabetes mellitus without complication (HCC)    Fatigue 11/23/2020   Gout    Hypertension    Leg heaviness 11/23/2020   Rectal cancer [facility])     Past Surgical History:  Procedure Laterality Date   BIOPSY  01/11/2020   Procedure: BIOPSY;  Surgeon: Irene Shipper, MD;  Location: [place];  Service: Endoscopy;;   COLONOSCOPY WITH PROPOFOL N/A 01/11/2020   Procedure: COLONOSCOPY WITH PROPOFOL;  Surgeon: Irene Shipper, MD;  Location: [place];  Service: Endoscopy;  Laterality: N/A;   NO PAST SURGERIES     POLYPECTOMY  01/11/2020   Procedure: POLYPECTOMY;  Surgeon: Irene Shipper, MD;  Location: [place];  Service: Endoscopy;;   SUBMUCOSAL TATTOO INJECTION  01/11/2020   Procedure: SUBMUCOSAL TATTOO INJECTION;  Surgeon: Irene Shipper, MD;  Location: [facility] ENDOSCOPY;  Service: Endoscopy;;    Current Medications: Current Meds  Medication Sig   albuterol (VENTOLIN HFA) 108 (90 Base) MCG/ACT inhaler INHALE 2 PUFFS INTO THE LUNGS EVERY SIX HOURS AS NEEDED FOR WHEEZING OR SHORTNESS OF BREATH.   allopurinol (ZYLOPRIM) 100 MG tablet Take 200 mg by mouth daily.   amLODipine (NORVASC) 5 MG tablet TAKE 2 TABLETS (10 MG TOTAL) BY MOUTH DAILY. (Patient taking differently: Take 10 mg by mouth daily.)   atorvastatin (LIPITOR) 40 MG tablet TAKE 1 TABLET (40 MG TOTAL) BY MOUTH DAILY. FOR HYPERCHOLESTEROLEMIA (Patient taking differently: Take 40 mg by mouth daily.)   Blood Glucose Monitoring Suppl (TRUE METRIX METER) w/Device KIT Check blood sugar fasting and ant bedtime and record   carvedilol (COREG) 25 MG tablet Take 1 tablet (25 mg total) by mouth 2 (two) times daily with a meal.   furosemide (LASIX) 20 MG tablet TAKE 3 TABLETS (60 MG TOTAL) BY MOUTH 2 (TWO) TIMES DAILY. (Patient taking differently: Take 60 mg by mouth 2 (two) times daily.)    gabapentin (NEURONTIN) 300 MG capsule TAKE 1 CAPSULE (300 MG TOTAL) BY MOUTH 2 (TWO) TIMES DAILY. (Patient taking differently: Take 300 mg by mouth 2 (two) times daily.)   glipiZIDE (GLUCOTROL) 5 MG tablet TAKE 0.5 TABLETS (2.5 MG TOTAL) BY MOUTH DAILY BEFORE BREAKFAST. (Patient taking differently: Take 5 mg by mouth daily before breakfast. Take 1 tablet daily)   glucose blood (TRUE METRIX BLOOD GLUCOSE TEST) test strip Use as instructed   isosorbide mononitrate (IMDUR) 60 MG 24 hr tablet TAKE 1 TABLET (60 MG TOTAL) BY MOUTH DAILY. (Patient taking differently: Take 60 mg by mouth daily.)   loperamide (IMODIUM A-D) 2 MG tablet Take 2-4 mg by mouth 4 (four) times daily as needed for diarrhea or loose stools.   Misc. Devices (DIGITAL GLASS SCALE) MISC Use scale to weight yourself. Increasing weight may indicate fluid overload   tamsulosin (FLOMAX) 0.4 MG CAPS capsule TAKE 1 CAPSULE (0.4 MG TOTAL) BY MOUTH DAILY. (Patient taking  differently: Take 0.4 mg by mouth daily.)   TRUEplus Lancets 28G MISC Check blood sugar fasting and at bedtime   [DISCONTINUED] carvedilol (COREG) 12.5 MG tablet TAKE 1 TABLET (12.5 MG TOTAL) BY MOUTH 2 (TWO) TIMES DAILY WITH A MEAL. (Patient taking differently: Take 12.5 mg by mouth 2 (two) times daily with a meal.)   [DISCONTINUED] hydrALAZINE (APRESOLINE) 50 MG tablet TAKE 1 TABLET (50 MG TOTAL) BY MOUTH 3 (THREE) TIMES DAILY. (Patient taking differently: Take 50 mg by mouth 3 (three) times daily.)     Allergies:   Patient has no known allergies.   Social History   Socioeconomic History   Marital status: Single    Spouse name: Not on file   Number of children: Not on file   Years of education: Not on file   Highest education level: Not on file  Occupational History   Not on file  Tobacco Use   Smoking status: Former    Packs/day: 0.50    Types: Cigarettes    Quit date: 10/27/2019    Years since quitting: 1.0   Smokeless tobacco: Never  Vaping Use   Vaping  Use: Never used  Substance and Sexual Activity   Alcohol use: Yes    Alcohol/week: 0.0 standard drinks    Comment: occ   Drug use: No   Sexual activity: Not on file  Other Topics Concern   Not on file  Social History Narrative   Not on file   Social Determinants of Health   Financial Resource Strain: Medium Risk   Difficulty of Paying Living Expenses: Somewhat hard  Food Insecurity: No Food Insecurity   Worried About Running Out of Food in the Last Year: Never true   Ran Out of Food in the Last Year: Never true  Transportation Needs: No Transportation Needs   Lack of Transportation (Medical): No   Lack of Transportation (Non-Medical): No  Physical Activity: Unknown   Days of Exercise per Week: 0 days   Minutes of Exercise per Session: Not on file  Stress: Stress Concern Present   Feeling of Stress : To some extent  Social Connections: Not on file     Family History: The patient's family history includes Heart failure in his brother.  ROS:   Please see the history of present illness.    (+) Fatigue (+) Bilateral LE heaviness All other systems reviewed and are negative.  EKGs/Labs/Other Studies Reviewed:    Echo 12/25/19: 1. Consider infiltrative cardiomyopathy such as amyloidosis or global  variant hypertrophic cardiomyopathy - LV wall thickness 1.8-1.9 cm. Left  ventricular ejection fraction, by estimation, is 60 to 65%. The left  ventricle has normal function. The left  ventricle has no regional wall motion abnormalities. There is severe  concentric left ventricular hypertrophy. Left ventricular diastolic  parameters are consistent with Grade II diastolic dysfunction  (pseudonormalization). Elevated left ventricular  end-diastolic pressure.   2. Right ventricular systolic function is normal. The right ventricular  size is normal. Tricuspid regurgitation signal is inadequate for assessing  PA pressure.   3. Left atrial size was mildly dilated.   4. The mitral  valve is abnormal. Mild mitral valve regurgitation.   5. The aortic valve is tricuspid. Aortic valve regurgitation is not  visualized. Mild aortic valve sclerosis is present, with no evidence of  aortic valve stenosis.   6. The inferior vena cava is normal in size with greater than 50%  respiratory variability, suggesting right atrial pressure of 3 mmHg.  Renal artery Doppler 12/2019: Normal bilaterally.  EKG:   11/23/2020: Sinus rhythm. Rate 69 bpm. RBBB. Left axis deviation. 05/31/2020: EKG is not ordered today.    Recent Labs: 01/03/2020: TSH 2.170 01/06/2020: Magnesium 2.5 05/21/2020: B Natriuretic Peptide 791.0 10/10/2020: ALT 9; BUN 24; Creatinine 2.63; Hemoglobin 11.2; Platelet Count 211; Potassium 3.8; Sodium 142   Recent Lipid Panel    Component Value Date/Time   CHOL 167 04/27/2019 1020   TRIG 226 (H) 04/27/2019 1020   HDL 30 (L) 04/27/2019 1020   CHOLHDL 5.6 (H) 04/27/2019 1020   CHOLHDL 5.2 (H) 10/23/2015 1009   VLDL 46 (H) 10/23/2015 1009   LDLCALC 98 04/27/2019 1020    Physical Exam:    VS:  BP (!) 154/98 (BP Location: Left Arm, Patient Position: Sitting)   Pulse 69   Ht 6' 1"  (1.854 m)   Wt 236 lb (107 kg)   BMI 31.14 kg/m  , BMI Body mass index is 31.14 kg/m. GENERAL:  Well-appearing.  HEENT: Pupils equal round and reactive, fundi not visualized, oral mucosa unremarkable NECK:  no jugular venous distention, waveform within normal limits, carotid upstroke brisk and symmetric, no bruits LUNGS:  Clear to auscultation bilaterally HEART:  RRR.  PMI not displaced or sustained,S1 and S2 within normal limits, no S3, no S4, no clicks, no rubs, no murmurs ABD:  Flat, positive bowel sounds normal in frequency in pitch, no bruits, no rebound, no guarding, no midline pulsatile mass, no hepatomegaly, no splenomegaly EXT:  Unable to palpate R DP/TP.  2+ L DP/TP, no edema, cyanosis or clubbing SKIN:  No rashes no nodules NEURO:  Cranial nerves II through XII grossly intact,  motor grossly intact throughout PSYCH:  Cognitively intact, oriented to person place and time  ASSESSMENT:    1. Essential hypertension   2. Pure hypercholesterolemia   3. Fatigue due to excessive exertion, initial encounter   4. Leg heaviness   5. Hypertension in chronic kidney disease due to type 2 diabetes mellitus ([place])   6. Hypertensive heart disease with chronic diastolic congestive heart failure ([place])   7. Chronic diastolic heart failure (HCC)      PLAN:   Essential hypertension Blood pressure was previously well-controlled but lately has been more elevated.  His diet has been poor and I suspect that stress from his upcoming surgery is contributing.  He has not had any low blood pressures recently.  We will increase hydralazine to 100 mg 3 times daily and carvedilol to 25 mg twice daily.  Given that he has surgery upcoming in a couple weeks, we will get him follow-up with the pharmacist in a week.  Continue amlodipine, carvedilol, Imdur, and furosemide.  Hyperlipidemia Continue atorvastatin.  He is due for fasting lipids and a CMP.  We will get this at follow-up.  Fatigue He reports exertional fatigue.  I am concerned that this is an anginal equivalent.  We will have him get a Lexiscan Myoview to assess for ischemia.  I do not think he is safe to walk on the treadmill.  This needs to be done prior to his upcoming surgery.  Leg heaviness He reports heaviness in his legs with exertion.  I do not feel his right pedal pulses well.  We will refer him for lower extremity arterial Dopplers and ABIs.  Chronic diastolic heart failure ([place]) Today he is euvolemic and doing well from a heart failure standpoint.  Blood pressure is poorly controlled.  Adjusting medicines as above.  He does  have severe LVH.  There was concern for infiltrative cardiomyopathy.  Likely, this is hypertensive cardiomyopathy.  After his surgery and once things settle down, would consider getting a PYP scan to rule out  ATTR amyloid.  Immunoglobulins and electrophoresis pending with [facility].   Disposition: FU with PharmD in 1 week. FU with Desmon Hitchner C. Oval Linsey, MD, [facility] in 3 months.   Medication Adjustments/Labs and Tests Ordered: Current medicines are reviewed at length with the patient today.  Concerns regarding medicines are outlined above.  Orders Placed This Encounter  Procedures   AMB Referral to [facility] Pharm[facility]   MYOCARDIAL PERFUSION IMAGING   EKG 12-Lead   VAS [place] LOWER EXTREMITY ARTERIAL DUPLEX   VAS [place] ABI WITH/WO TBI    Meds ordered this encounter  Medications   Colchicine (MITIGARE) 0.6 MG CAPS    Sig: Take 2 tablets (1.2 mg) orally at the onset of a gout attack and then daily until improved    Dispense:  30 capsule    Refill:  1   hydrALAZINE (APRESOLINE) 100 MG tablet    Sig: Take 1 tablet (100 mg total) by mouth 3 (three) times daily.    Dispense:  90 tablet    Refill:  6    New dose, d/c 50 mg rx   carvedilol (COREG) 25 MG tablet    Sig: Take 1 tablet (25 mg total) by mouth 2 (two) times daily with a meal.    Dispense:  60 tablet    Refill:  6    New dose, d/c 12.5 mg rx    I,Mathew Stumpf,acting as a scribe for [place], MD.,have documented all relevant documentation on the behalf of Skeet Latch, MD,as directed by  Skeet Latch, MD while in the presence of Skeet Latch, MD.  I, [place] Oval Linsey, MD have reviewed all documentation for this visit.  The documentation of the exam, diagnosis, procedures, and orders on 11/23/2020 are all accurate and complete.   Signed, Skeet Latch, MD  11/23/2020 11:22 AM    [facility]

## 2020-11-23 ENCOUNTER — Other Ambulatory Visit: Payer: Self-pay

## 2020-11-23 ENCOUNTER — Encounter (HOSPITAL_BASED_OUTPATIENT_CLINIC_OR_DEPARTMENT_OTHER): Payer: Self-pay | Admitting: Cardiovascular Disease

## 2020-11-23 ENCOUNTER — Ambulatory Visit (HOSPITAL_BASED_OUTPATIENT_CLINIC_OR_DEPARTMENT_OTHER): Payer: Medicaid Other | Admitting: Cardiovascular Disease

## 2020-11-23 DIAGNOSIS — R5383 Other fatigue: Secondary | ICD-10-CM

## 2020-11-23 DIAGNOSIS — E1122 Type 2 diabetes mellitus with diabetic chronic kidney disease: Secondary | ICD-10-CM

## 2020-11-23 DIAGNOSIS — I10 Essential (primary) hypertension: Secondary | ICD-10-CM | POA: Diagnosis not present

## 2020-11-23 DIAGNOSIS — I110 Hypertensive heart disease with heart failure: Secondary | ICD-10-CM

## 2020-11-23 DIAGNOSIS — R29898 Other symptoms and signs involving the musculoskeletal system: Secondary | ICD-10-CM | POA: Diagnosis not present

## 2020-11-23 DIAGNOSIS — E7800 Pure hypercholesterolemia, unspecified: Secondary | ICD-10-CM | POA: Diagnosis not present

## 2020-11-23 DIAGNOSIS — I472 Ventricular tachycardia: Secondary | ICD-10-CM

## 2020-11-23 DIAGNOSIS — I4729 Other ventricular tachycardia: Secondary | ICD-10-CM

## 2020-11-23 DIAGNOSIS — T733XXA Exhaustion due to excessive exertion, initial encounter: Secondary | ICD-10-CM

## 2020-11-23 DIAGNOSIS — I5032 Chronic diastolic (congestive) heart failure: Secondary | ICD-10-CM

## 2020-11-23 DIAGNOSIS — I129 Hypertensive chronic kidney disease with stage 1 through stage 4 chronic kidney disease, or unspecified chronic kidney disease: Secondary | ICD-10-CM

## 2020-11-23 HISTORY — DX: Other symptoms and signs involving the musculoskeletal system: R29.898

## 2020-11-23 HISTORY — DX: Other ventricular tachycardia: I47.29

## 2020-11-23 HISTORY — DX: Other fatigue: R53.83

## 2020-11-23 HISTORY — DX: Ventricular tachycardia: I47.2

## 2020-11-23 MED ORDER — HYDRALAZINE HCL 100 MG PO TABS
100.0000 mg | ORAL_TABLET | Freq: Three times a day (TID) | ORAL | 6 refills | Status: DC
Start: 2020-11-23 — End: 2020-11-30
  Filled 2020-11-23: qty 90, 30d supply, fill #0

## 2020-11-23 MED ORDER — CARVEDILOL 25 MG PO TABS
25.0000 mg | ORAL_TABLET | Freq: Two times a day (BID) | ORAL | 6 refills | Status: DC
  Filled 2020-11-23: qty 60, 30d supply, fill #0

## 2020-11-23 MED ORDER — COLCHICINE 0.6 MG PO CAPS: ORAL_CAPSULE | ORAL | 1 refills | Status: DC

## 2020-11-23 NOTE — Patient Instructions (Signed)
Medication Instructions:  INCREASE CARVEDILOL TO 25 MG TWICE A DAY   INCREASE HYDRALAZINE TO 100 MG THREE TIMES A DAY  *If you need a refill on your cardiac medications before your next appointment, please call your pharmacy*  Lab Work: NONE   Testing/Procedures: Your physician has requested that you have a lexiscan myoview. For further information please visit [email or web address]. Please follow instruction sheet, as given.  Your physician has requested that you have an ankle brachial index (ABI). During this test an ultrasound and blood pressure cuff are used to evaluate the arteries that supply the arms and legs with blood. Allow thirty minutes for this exam. There are no restrictions or special instructions.  Your physician has requested that you have a lower or upper extremity arterial duplex. This test is an ultrasound of the arteries in the legs or arms. It looks at arterial blood flow in the legs and arms. Allow one hour for Lower and Upper Arterial scans. There are no restrictions or special instructions  THE ABOVE WITH BE AT THE [place]: At [facility], you and your health needs are our priority.  As part of our continuing mission to provide you with exceptional heart care, we have created designated Provider Care Teams.  These Care Teams include your primary Cardiologist (physician) and Advanced Practice Providers (APPs -  Physician Assistants and Nurse Practitioners) who all work together to provide you with the care you need, when you need it.  We recommend signing up for the patient portal called "MyChart".  Sign up information is provided on this After Visit Summary.  MyChart is used to connect with patients for Virtual Visits (Telemedicine).  Patients are able to view lab/test results, encounter notes, upcoming appointments, etc.  Non-urgent messages can be sent to your provider as well.   To learn more about what you can do with MyChart, go to  [email or web address].    Your next appointment:   3 month(s)  The format for your next appointment:   In Person  Provider:   Skeet Latch, MD or Laurann [place], NP  Your physician recommends that you schedule a follow-up appointment in:1 [place]

## 2020-11-23 NOTE — Assessment & Plan Note (Signed)
He reports heaviness in his legs with exertion.  I do not feel his right pedal pulses well.  We will refer him for lower extremity arterial Dopplers and ABIs.

## 2020-11-23 NOTE — Assessment & Plan Note (Signed)
Continue atorvastatin.  He is due for fasting lipids and a CMP.  We will get this at follow-up.

## 2020-11-23 NOTE — Assessment & Plan Note (Signed)
He reports exertional fatigue.  I am concerned that this is an anginal equivalent.  We will have him get a Lexiscan Myoview to assess for ischemia.  I do not think he is safe to walk on the treadmill.  This needs to be done prior to his upcoming surgery.

## 2020-11-23 NOTE — Assessment & Plan Note (Signed)
Blood pressure was previously well-controlled but lately has been more elevated.  His diet has been poor and I suspect that stress from his upcoming surgery is contributing.  He has not had any low blood pressures recently.  We will increase hydralazine to 100 mg 3 times daily and carvedilol to 25 mg twice daily.  Given that he has surgery upcoming in a couple weeks, we will get him follow-up with the pharmacist in a week.  Continue amlodipine, carvedilol, Imdur, and furosemide.

## 2020-11-23 NOTE — Assessment & Plan Note (Signed)
Stable.  Continue carvedilol. 

## 2020-11-23 NOTE — Assessment & Plan Note (Addendum)
Today he is euvolemic and doing well from a heart failure standpoint.  Blood pressure is poorly controlled.  Adjusting medicines as above.  He does have severe LVH.  There was concern for infiltrative cardiomyopathy.  Likely, this is hypertensive cardiomyopathy.  After his surgery and once things settle down, would consider getting a PYP scan to rule out ATTR amyloid.  Immunoglobulins and electrophoresis pending with [facility].

## 2020-11-27 NOTE — Telephone Encounter (Signed)
  Aaron Edelman with pre-anesthesia at [facility] calling, following up pt's preop clearance, he did confirmed it is for Open Low Anterior Resection with Diverting Loop colostomy, he gave fax# [phone] to send clearance to

## 2020-11-27 NOTE — Telephone Encounter (Signed)
Message sent to Dr. Blenda Mounts nurse and scheduling to see if they can move Myoview up sooner.

## 2020-11-27 NOTE — Telephone Encounter (Signed)
Pre-op covering staff, patient was seen by Dr. Oval Linsey last week and Myoview was ordered but is not scheduled until 12/26/2020. His surgery is scheduled for 12/06/2020. Therefore, can we please try to move up his Myoview for before then or else his surgery will have to be moved back.  Thank you! Isis Costanza.

## 2020-11-28 ENCOUNTER — Telehealth (HOSPITAL_COMMUNITY): Payer: Self-pay | Admitting: *Deleted

## 2020-11-28 NOTE — Telephone Encounter (Signed)
Close encounter 

## 2020-11-29 ENCOUNTER — Inpatient Hospital Stay (HOSPITAL_COMMUNITY): Admission: RE | Admit: 2020-11-29 | Payer: Medicaid Other | Source: Ambulatory Visit

## 2020-11-30 ENCOUNTER — Other Ambulatory Visit: Payer: Self-pay

## 2020-11-30 ENCOUNTER — Ambulatory Visit (INDEPENDENT_AMBULATORY_CARE_PROVIDER_SITE_OTHER): Payer: Medicaid Other | Admitting: Pharmacist

## 2020-11-30 ENCOUNTER — Telehealth (HOSPITAL_COMMUNITY): Payer: Self-pay | Admitting: *Deleted

## 2020-11-30 VITALS — BP 122/78 | HR 69 | Wt 241.0 lb

## 2020-11-30 DIAGNOSIS — E1122 Type 2 diabetes mellitus with diabetic chronic kidney disease: Secondary | ICD-10-CM

## 2020-11-30 DIAGNOSIS — I110 Hypertensive heart disease with heart failure: Secondary | ICD-10-CM | POA: Diagnosis not present

## 2020-11-30 DIAGNOSIS — I10 Essential (primary) hypertension: Secondary | ICD-10-CM

## 2020-11-30 DIAGNOSIS — I129 Hypertensive chronic kidney disease with stage 1 through stage 4 chronic kidney disease, or unspecified chronic kidney disease: Secondary | ICD-10-CM | POA: Diagnosis not present

## 2020-11-30 DIAGNOSIS — I5032 Chronic diastolic (congestive) heart failure: Secondary | ICD-10-CM

## 2020-11-30 MED ORDER — COLCHICINE 0.6 MG PO CAPS
1.0000 | ORAL_CAPSULE | ORAL | 1 refills | Status: DC | PRN
  Filled 2020-11-30: qty 30, 30d supply, fill #0

## 2020-11-30 MED ORDER — CARVEDILOL 25 MG PO TABS
25.0000 mg | ORAL_TABLET | Freq: Two times a day (BID) | ORAL | 3 refills | Status: DC
  Filled 2020-11-30 – 2021-05-07 (×3): qty 180, 90d supply, fill #0

## 2020-11-30 MED ORDER — AMLODIPINE BESYLATE 10 MG PO TABS
10.0000 mg | ORAL_TABLET | Freq: Every day | ORAL | 3 refills | Status: DC
  Filled 2020-11-30 – 2021-05-07 (×4): qty 90, 90d supply, fill #0

## 2020-11-30 MED ORDER — HYDRALAZINE HCL 100 MG PO TABS
100.0000 mg | ORAL_TABLET | Freq: Three times a day (TID) | ORAL | 3 refills | Status: DC
  Filled 2020-11-30 – 2021-05-07 (×3): qty 270, 90d supply, fill #0

## 2020-11-30 NOTE — Telephone Encounter (Signed)
Close encounter 

## 2020-11-30 NOTE — Patient Instructions (Addendum)
It was nice to see you today  Your blood pressure goal is < 130/51mmHg  Continue taking your current medications  Cut back on regular soda - less caffeine will help to control your blood pressure, and less sugar will help to improve your glucose readings  When you do need colchicine, take a lower dose since it interacts with your carvedilol: reduce initial dose to 0.6 mg x 1 dose, with next dose no sooner than 3 days later  Keep your follow up with Dr Oval Linsey in November. Come fasting to that appointment so she can check your cholesterol

## 2020-11-30 NOTE — Progress Notes (Signed)
Patient ID: Craig West                 DOB: 04-13-1965                      MRN: [id number]     HPI: Craig West is a 55 y.o. male referred by Dr. Oval West to HTN clinic. PMH is significant for HTN, DM, CKD IV, HLD, diastolic CHF with EF 54-27% and severe LVH (12/2019 echo), gout, rectal carcinoma, and asthma. He has struggled to afford his medications due to lack of insurance and income. He was admitted to the hospital 12/2019 with hypertensive emergency (BP 263/163). Lisinopril and HCTZ were stopped secondary to CKD IV; he was diuresed with IV Lasix and discharge weight was 232 lbs. F/u BP at PCP dropped to 84/60 and amlodipine dose was decreased; his weight had also increased up to 260 lbs, SCr 4.79, K 5.9. At most recent visit with Dr Craig West on 11/23/20, BP was 154/98. Needs BP under control for procedure on 9/7. Hydralazine was increased to 18m TID and carvedilol increased to 262mBID. He also reported exertional fatigue and was scheduled for a Lexiscan Myoview to assess for ischemia before procedure, now scheduled for tomorrow 9/2. He was also referred for lower extremity dopplers and ABIs due to heaviness in his legs with exertion.  Pt reports tolerating his meds well. Still doubling up and using his old rx for carvedilol and hydralazine. Denies headache, vision changes, LE edema. Notes occasional dizziness, some weeks won't notice at all though. Has gained some weight since his visit last week, reports dietary indiscretion and has been eating more hamburgers. Home BP was 135/75 recently. Will get fatigued walking up the stairs. Not fasting today. Denies NSAID use.  Current HTN meds:  Amlodipine 1093maily - AM Carvedilol 16m49mD Hydralazine 100mg35m Imdur 60mg 1my - AM Furosemide 60mg B26mPreviously tried: HCTZ and lisinopril - stopped secondary to CKD IV  BP goal: <130/80mmHg 39mily History: Heart failure in his brother.  Social History: Former tobacco use 1/2 PPD, quit in  2021. Denies drug use, occasional alcohol use.  Diet: Likes beef, burgers, rice and gravy. Has been eating more lately. Cut out [employer]. Drinks 2 coffees and 6 regular sodas every day.  Exercise: Occasional walking but gets fatigued, gets tired going up stairs  Labs: 11/22/20: SCr 3.2, K 4.5, Na 139  Wt Readings from Last 3 Encounters:  11/23/20 236 lb (107 kg)  10/10/20 228 lb (103.4 kg)  09/21/20 230 lb 6.4 oz (104.5 kg)   BP Readings from Last 3 Encounters:  11/23/20 (!) 154/98  10/10/20 (!) 183/133  09/25/20 129/86   Pulse Readings from Last 3 Encounters:  11/23/20 69  10/10/20 60  09/25/20 73    Renal function: CrCl cannot be calculated (Patient's most recent lab result is older than the maximum 21 days allowed.).  Past Medical History:  Diagnosis Date   Asthma    CHF (congestive heart failure) (HCC)    Chamizal (chronic kidney disease), stage IV (HCC)    CarrollID-19 virus infection 04/2019   Diabetes mellitus without complication (HCC)    Fatigue 11/23/2020   Gout    Hypertension    Leg heaviness 11/23/2020   NSVT (nonsustained ventricular tachycardia) (HCC) 8/2Noonday022   Rectal cancer (HCC)    [facility] Medications on File Prior to Visit  Medication Sig Dispense Refill   albuterol (VENTOLIN HFA) 108 (90 Base) MCG/ACT  inhaler INHALE 2 PUFFS INTO THE LUNGS EVERY SIX HOURS AS NEEDED FOR WHEEZING OR SHORTNESS OF BREATH. 18 g 1   allopurinol (ZYLOPRIM) 100 MG tablet Take 200 mg by mouth daily.     amLODipine (NORVASC) 5 MG tablet TAKE 2 TABLETS (10 MG TOTAL) BY MOUTH DAILY. (Patient taking differently: Take 10 mg by mouth daily.) 60 tablet 6   atorvastatin (LIPITOR) 40 MG tablet TAKE 1 TABLET (40 MG TOTAL) BY MOUTH DAILY. FOR HYPERCHOLESTEROLEMIA (Patient taking differently: Take 40 mg by mouth daily.) 30 tablet 6   Blood Glucose Monitoring Suppl (TRUE METRIX METER) w/Device KIT Check blood sugar fasting and ant bedtime and record 1 kit 0   carvedilol (COREG) 25 MG  tablet Take 1 tablet (25 mg total) by mouth 2 (two) times daily with a meal. 60 tablet 6   Colchicine (MITIGARE) 0.6 MG CAPS Take 2 tablets (1.2 mg) orally at the onset of a gout attack and then daily until improved 30 capsule 1   furosemide (LASIX) 20 MG tablet TAKE 3 TABLETS (60 MG TOTAL) BY MOUTH 2 (TWO) TIMES DAILY. (Patient taking differently: Take 60 mg by mouth 2 (two) times daily.) 180 tablet 2   gabapentin (NEURONTIN) 300 MG capsule TAKE 1 CAPSULE (300 MG TOTAL) BY MOUTH 2 (TWO) TIMES DAILY. (Patient taking differently: Take 300 mg by mouth 2 (two) times daily.) 60 capsule 5   glipiZIDE (GLUCOTROL) 5 MG tablet TAKE 0.5 TABLETS (2.5 MG TOTAL) BY MOUTH DAILY BEFORE BREAKFAST. (Patient taking differently: Take 5 mg by mouth daily before breakfast. Take 1 tablet daily) 15 tablet 6   glucose blood (TRUE METRIX BLOOD GLUCOSE TEST) test strip Use as instructed 200 each 12   hydrALAZINE (APRESOLINE) 100 MG tablet Take 1 tablet (100 mg total) by mouth 3 (three) times daily. 90 tablet 6   isosorbide mononitrate (IMDUR) 60 MG 24 hr tablet TAKE 1 TABLET (60 MG TOTAL) BY MOUTH DAILY. (Patient taking differently: Take 60 mg by mouth daily.) 30 tablet 6   loperamide (IMODIUM A-D) 2 MG tablet Take 2-4 mg by mouth 4 (four) times daily as needed for diarrhea or loose stools.     metroNIDAZOLE (FLAGYL) 500 MG tablet Take 1 tablet at 3pm, 4pm, and 10pm the day before surgery (Patient not taking: Reported on 11/23/2020) 3 tablet 0   Misc. Devices (DIGITAL GLASS SCALE) MISC Use scale to weight yourself. Increasing weight may indicate fluid overload 1 each 0   neomycin (MYCIFRADIN) 500 MG tablet Take 2 tablets at 3pm, 4pm, and 10pm the day before surgery (Patient not taking: Reported on 11/23/2020) 6 tablet 0   tamsulosin (FLOMAX) 0.4 MG CAPS capsule TAKE 1 CAPSULE (0.4 MG TOTAL) BY MOUTH DAILY. (Patient taking differently: Take 0.4 mg by mouth daily.) 30 capsule 6   TRUEplus Lancets 28G MISC Check blood sugar fasting  and at bedtime 210 each 2   No current facility-administered medications on file prior to visit.    No Known Allergies   Assessment/Plan:  1. Hypertension - BP has improved notably and is now at goal < 130/18mHg. Will continue current meds including amlodipine 170mdaily, carvedilol 252mID, hydralazine 100m45mD, Imdur 60mg28mly, and furosemide 60mg 49m Strongly encouraged pt to cut back on caffeine intake - he's currently having 8 servings a day (2 cups iced coffee, 6 regular sodas). Discussed that he especially needs to cut back on regular soda given his DM as well.   2. Drug interaction - pt recently prescribed  colchicine 1.385m followed by 0.624mdaily as needed for gout flare. He requires dose reduction of colchicine due to interaction with his carvedilol as well as his renal dysfunction. I have advised him to take lower dose of 0.85m30mt onset of gout flare, followed by another dose no sooner than 3 days later, new rx has been sent in.  F/u with PharmD as needed. Pt isn't fasting today - he will come fasting to November appt with Dr RanOval West check lipids.  Ameya Kutz E. Loomis Anacker, PharmD, BCACP, [id number] [id number]: [phone]: ([phone]/2022 9:24 AM

## 2020-12-01 ENCOUNTER — Ambulatory Visit (HOSPITAL_COMMUNITY)
Admission: RE | Admit: 2020-12-01 | Discharge: 2020-12-01 | Disposition: A | Discharge status: Home / Self Care | Payer: Medicaid Other | Source: Ambulatory Visit | Attending: Cardiovascular Disease | Admitting: Cardiovascular Disease

## 2020-12-01 DIAGNOSIS — I10 Essential (primary) hypertension: Secondary | ICD-10-CM | POA: Insufficient documentation

## 2020-12-01 DIAGNOSIS — T733XXA Exhaustion due to excessive exertion, initial encounter: Secondary | ICD-10-CM | POA: Diagnosis not present

## 2020-12-01 DIAGNOSIS — T733XXD Exhaustion due to excessive exertion, subsequent encounter: Secondary | ICD-10-CM

## 2020-12-01 DIAGNOSIS — E7800 Pure hypercholesterolemia, unspecified: Secondary | ICD-10-CM | POA: Insufficient documentation

## 2020-12-01 LAB — MYOCARDIAL PERFUSION IMAGING
LV dias vol: 170 mL (ref 62–150)
LV sys vol: 91 mL
Nuc Stress EF: 47 %
Peak HR: 68 {beats}/min
Rest HR: 57 {beats}/min
Rest Nuclear Isotope Dose: 10.1 mCi
SDS: 1
SRS: 7
SSS: 8
ST Depression (mm): 0 mm
Stress Nuclear Isotope Dose: 29.1 mCi
TID: 1.01

## 2020-12-01 MED ORDER — TECHNETIUM TC 99M TETROFOSMIN IV KIT
29.1000 | PACK | Freq: Once | INTRAVENOUS | Status: AC | PRN
  Administered 2020-12-01: 29.1 via INTRAVENOUS
  Filled 2020-12-01: qty 30

## 2020-12-01 MED ORDER — REGADENOSON 0.4 MG/5ML IV SOLN
0.4000 mg | Freq: Once | INTRAVENOUS | Status: AC
  Administered 2020-12-01: 0.4 mg via INTRAVENOUS

## 2020-12-01 MED ORDER — AMINOPHYLLINE 25 MG/ML IV SOLN
75.0000 mg | Freq: Once | INTRAVENOUS | Status: AC
  Administered 2020-12-01: 75 mg via INTRAVENOUS

## 2020-12-01 MED ORDER — TECHNETIUM TC 99M TETROFOSMIN IV KIT
10.1000 | PACK | Freq: Once | INTRAVENOUS | Status: AC | PRN
  Administered 2020-12-01: 10.1 via INTRAVENOUS
  Filled 2020-12-01: qty 11

## 2020-12-05 ENCOUNTER — Inpatient Hospital Stay: Payer: Medicaid Other | Attending: Oncology | Admitting: Oncology

## 2020-12-05 ENCOUNTER — Inpatient Hospital Stay: Payer: Medicaid Other

## 2020-12-05 ENCOUNTER — Telehealth: Payer: Self-pay

## 2020-12-05 NOTE — Telephone Encounter (Signed)
TC to Pt to inquire about missed appointment. Spoke with Pt who stated he is going for Surgery at [facility] on Wednesday. Pt was not aware of appointment with Dr Benay Spice. Informed Pt I will cancel appointment and Dr Benay Spice will see him after surgery. Pt verbalized understanding.

## 2020-12-05 NOTE — Telephone Encounter (Signed)
   [facility] Pre-operative Risk Assessment    Patient Name: Craig West  DOB: 26-Oct-1965 MRN: [id number]  HEARTCARE STAFF:  - IMPORTANT!!!!!! Under Visit Info/Reason for Call, type in Other and utilize the format Clearance MM/DD/YY or Clearance TBD. Do not use dashes or single digits. - Please review there is not already an duplicate clearance open for this procedure. - If request is for dental extraction, please clarify the # of teeth to be extracted. - If the patient is currently at the dentist's office, call Pre-Op Callback Staff (MA/nurse) to input urgent request.  - If the patient is not currently in the dentist office, please route to the Pre-Op pool.  Request for surgical clearance:  What type of surgery is being performed? Open low Anterior rejection w/ diverting loop colostomy  When is this surgery scheduled? 12/06/2020  What type of clearance is required (medical clearance vs. Pharmacy clearance to hold med vs. Both)? Cardiac   Are there any medications that need to be held prior to surgery and how long? None  Practice name and name of physician performing surgery? [facility], NP   What is the office phone number? [phone]   7.   What is the office fax number? [phone]  8.   Anesthesia type (None, local, MAC, general) ? General   Cliffton Spradley L Mayco Walrond 12/05/2020, 10:01 AM  _________________________________________________________________   (provider comments below)

## 2020-12-05 NOTE — Telephone Encounter (Signed)
   Name: Craig West  DOB: 28-Jul-1965  MRN: [id number]   Primary Cardiologist: Skeet Latch, MD  Chart reviewed as part of pre-operative protocol coverage. Patient was contacted 12/05/2020 in reference to pre-operative risk assessment for pending surgery as outlined below.  Craig West was last seen on 11/23/20 by Craig West. She recommended a nuclear stress test for preop clearance.   Per her result notes: Stress test shows that he may have had a heart attack in the past but no new areas of blockage.  This abnormality may also be due to the right bundle branch block that is seen on his EKG.  Overall, the risk of heart attack for his surgery is low and okay to give him a surgical clearance note.  Therefore, based on ACC/AHA guidelines, the patient would be at acceptable risk for the planned procedure without further cardiovascular testing.   The patient was advised that if he develops new symptoms prior to surgery to contact our office to arrange for a follow-up visit, and he verbalized understanding.  I will route this recommendation to the requesting party via Epic fax function and remove from pre-op pool. Please call with questions.  [place], PA 12/05/2020, 11:04 AM

## 2020-12-07 ENCOUNTER — Other Ambulatory Visit: Payer: Self-pay

## 2020-12-08 ENCOUNTER — Other Ambulatory Visit: Payer: Self-pay

## 2020-12-11 ENCOUNTER — Other Ambulatory Visit: Payer: Self-pay

## 2020-12-26 ENCOUNTER — Encounter (HOSPITAL_COMMUNITY): Payer: Medicaid Other

## 2020-12-26 ENCOUNTER — Encounter: Payer: Self-pay | Admitting: *Deleted

## 2020-12-26 ENCOUNTER — Ambulatory Visit (HOSPITAL_COMMUNITY)
Admission: RE | Admit: 2020-12-26 | Payer: Medicaid Other | Source: Ambulatory Visit | Attending: Cardiovascular Disease | Admitting: Cardiovascular Disease

## 2020-12-26 NOTE — Progress Notes (Signed)
Per Dr. Benay Spice: Scheduling message sent for f/u with him or NP week of 10/3 or 10/10.

## 2020-12-28 ENCOUNTER — Other Ambulatory Visit: Payer: Self-pay | Admitting: Family Medicine

## 2020-12-28 ENCOUNTER — Other Ambulatory Visit: Payer: Self-pay

## 2020-12-28 DIAGNOSIS — E1122 Type 2 diabetes mellitus with diabetic chronic kidney disease: Secondary | ICD-10-CM

## 2020-12-28 MED FILL — Isosorbide Mononitrate Tab ER 24HR 60 MG: ORAL | 90 days supply | Qty: 90 | Fill #1 | Status: AC

## 2020-12-28 MED FILL — Tamsulosin HCl Cap 0.4 MG: ORAL | 90 days supply | Qty: 90 | Fill #1 | Status: AC

## 2020-12-28 NOTE — Telephone Encounter (Signed)
Discontinued on 11/30/20. Amlodipine 5 mg is no longer the appropriate dosage for the patient-Amlodipine 10 mg has been ordered. Refusing this refill request.

## 2020-12-29 ENCOUNTER — Other Ambulatory Visit: Payer: Self-pay

## 2021-01-01 ENCOUNTER — Other Ambulatory Visit (HOSPITAL_BASED_OUTPATIENT_CLINIC_OR_DEPARTMENT_OTHER): Payer: Self-pay | Admitting: Cardiovascular Disease

## 2021-01-01 ENCOUNTER — Other Ambulatory Visit: Payer: Self-pay

## 2021-01-01 ENCOUNTER — Ambulatory Visit (HOSPITAL_COMMUNITY)
Admission: RE | Admit: 2021-01-01 | Discharge: 2021-01-01 | Disposition: A | Discharge status: Home / Self Care | Payer: Medicaid Other | Source: Ambulatory Visit | Attending: Cardiology | Admitting: Cardiology

## 2021-01-01 DIAGNOSIS — E7800 Pure hypercholesterolemia, unspecified: Secondary | ICD-10-CM | POA: Insufficient documentation

## 2021-01-01 DIAGNOSIS — T733XXA Exhaustion due to excessive exertion, initial encounter: Secondary | ICD-10-CM | POA: Insufficient documentation

## 2021-01-01 DIAGNOSIS — I10 Essential (primary) hypertension: Secondary | ICD-10-CM | POA: Insufficient documentation

## 2021-01-01 DIAGNOSIS — R29898 Other symptoms and signs involving the musculoskeletal system: Secondary | ICD-10-CM

## 2021-01-01 DIAGNOSIS — R200 Anesthesia of skin: Secondary | ICD-10-CM

## 2021-01-02 ENCOUNTER — Encounter: Payer: Self-pay | Admitting: Family Medicine

## 2021-01-02 ENCOUNTER — Other Ambulatory Visit: Payer: Self-pay

## 2021-01-02 ENCOUNTER — Ambulatory Visit: Payer: Medicaid Other | Attending: Family Medicine | Admitting: Family Medicine

## 2021-01-02 VITALS — BP 111/68 | HR 64 | Ht 73.0 in | Wt 221.8 lb

## 2021-01-02 DIAGNOSIS — Z85048 Personal history of other malignant neoplasm of rectum, rectosigmoid junction, and anus: Secondary | ICD-10-CM | POA: Diagnosis not present

## 2021-01-02 DIAGNOSIS — T8149XA Infection following a procedure, other surgical site, initial encounter: Secondary | ICD-10-CM

## 2021-01-02 DIAGNOSIS — E1122 Type 2 diabetes mellitus with diabetic chronic kidney disease: Secondary | ICD-10-CM | POA: Diagnosis not present

## 2021-01-02 DIAGNOSIS — Z23 Encounter for immunization: Secondary | ICD-10-CM | POA: Insufficient documentation

## 2021-01-02 DIAGNOSIS — I130 Hypertensive heart and chronic kidney disease with heart failure and stage 1 through stage 4 chronic kidney disease, or unspecified chronic kidney disease: Secondary | ICD-10-CM | POA: Diagnosis present

## 2021-01-02 DIAGNOSIS — N184 Chronic kidney disease, stage 4 (severe): Secondary | ICD-10-CM | POA: Diagnosis not present

## 2021-01-02 DIAGNOSIS — Z7984 Long term (current) use of oral hypoglycemic drugs: Secondary | ICD-10-CM | POA: Diagnosis not present

## 2021-01-02 DIAGNOSIS — Z8249 Family history of ischemic heart disease and other diseases of the circulatory system: Secondary | ICD-10-CM | POA: Insufficient documentation

## 2021-01-02 DIAGNOSIS — I509 Heart failure, unspecified: Secondary | ICD-10-CM | POA: Insufficient documentation

## 2021-01-02 DIAGNOSIS — I110 Hypertensive heart disease with heart failure: Secondary | ICD-10-CM | POA: Diagnosis not present

## 2021-01-02 DIAGNOSIS — Z933 Colostomy status: Secondary | ICD-10-CM | POA: Diagnosis not present

## 2021-01-02 DIAGNOSIS — Z923 Personal history of irradiation: Secondary | ICD-10-CM | POA: Insufficient documentation

## 2021-01-02 DIAGNOSIS — Z8616 Personal history of COVID-19: Secondary | ICD-10-CM | POA: Insufficient documentation

## 2021-01-02 DIAGNOSIS — I5032 Chronic diastolic (congestive) heart failure: Secondary | ICD-10-CM

## 2021-01-02 DIAGNOSIS — C20 Malignant neoplasm of rectum: Secondary | ICD-10-CM | POA: Diagnosis not present

## 2021-01-02 LAB — POCT GLYCOSYLATED HEMOGLOBIN (HGB A1C): HbA1c, POC (controlled diabetic range): 6.3 % (ref 0.0–7.0)

## 2021-01-02 MED ORDER — CEPHALEXIN 500 MG PO CAPS
500.0000 mg | ORAL_CAPSULE | Freq: Two times a day (BID) | ORAL | 0 refills | Status: DC
  Filled 2021-01-02: qty 14, 7d supply, fill #0

## 2021-01-02 NOTE — Progress Notes (Signed)
Pt is having pain near ostomy site.

## 2021-01-02 NOTE — Patient Instructions (Signed)
Discontinue Glipizide to prevent low blood sugars.

## 2021-01-02 NOTE — Progress Notes (Signed)
Subjective:  Patient ID: Craig West, male    DOB: [birth year]  Age: 55 y.o. MRN: [id number]  CC: Hospitalization Follow-up   HPI Craig West is a 55 y.o. year old male with a history of hypertension, type 2 diabetes mellitus (A1c 6.3), asthma, gout, stage IV chronic kidney disease, Hypertensive Heart Disease, Rectal carcinoma diagnosed in 12/2018 (completed chemotherapy and radiation), s/p Low anterior resection with diverting Colostomy at [facility] on 12/06/20 here for a follow up visit.  Interval History: He lives with 2 room mates and has no help with his care. Colostomy supplies are shipped by the DME company. He followed up at [facility] with his Surgeon on 12/26/20 and his next appointment is in 57 week He has pain in his rectum which is unbearable and he has been on Tylenol. Gen Surgery notes reviewed. He complains of drainage at his surgical site. Staples were removed ta his last visit. He has no fever.  Followed by [facility] for CKD From a Diabetes standpoint he is doing well with no hypoglycemia or neuropathy symptoms. He has been taking Glipizide 99m daily. Compliant with his antihypertensives. Past Medical History:  Diagnosis Date   Asthma    [place] ([place]ive heart failure) ([place]    CKD (chronic kidney disease), stage IV ([place]    COVID-19 virus infection 04/2019   Diabetes mellitus without complication (HCC)    Fatigue 11/23/2020   Gout    Hypertension    Leg heaviness 11/23/2020   NSVT (nonsustained ventricular tachycardia) 11/23/2020   Rectal cancer ([facility]     Past Surgical History:  Procedure Laterality Date   BIOPSY  01/11/2020   Procedure: BIOPSY;  Surgeon: PIrene Shipper MD;  [place] [facility]  Service: Endoscopy;;   COLONOSCOPY WITH PROPOFOL N/A 01/11/2020   Procedure: COLONOSCOPY WITH PROPOFOL;  Surgeon: PIrene Shipper MD;  [place] [facility]  Service: Endoscopy;  Laterality: N/A;   NO PAST SURGERIES     POLYPECTOMY  01/11/2020   Procedure: POLYPECTOMY;   Surgeon: PIrene Shipper MD;  [place]  Service: Endoscopy;;   SUBMUCOSAL TATTOO INJECTION  01/11/2020   Procedure: SUBMUCOSAL TATTOO INJECTION;  Surgeon: PIrene Shipper MD;  [facility]  Service: Endoscopy;;    Family History  Problem Relation Age of Onset   Heart failure Brother     No Known Allergies  Outpatient Medications Prior to Visit  Medication Sig Dispense Refill   allopurinol (ZYLOPRIM) 100 MG tablet Take 200 mg by mouth daily.     amLODipine (NORVASC) 10 MG tablet Take 1 tablet (10 mg total) by mouth daily. 90 tablet 3   atorvastatin (LIPITOR) 40 MG tablet TAKE 1 TABLET (40 MG TOTAL) BY MOUTH DAILY. FOR HYPERCHOLESTEROLEMIA (Patient taking differently: Take 40 mg by mouth daily.) 30 tablet 6   Blood Glucose Monitoring Suppl (TRUE METRIX METER) w/Device KIT Check blood sugar fasting and ant bedtime and record 1 kit 0   carvedilol (COREG) 25 MG tablet Take 1 tablet (25 mg total) by mouth 2 (two) times daily with a meal. 180 tablet 3   Colchicine (MITIGARE) 0.6 MG CAPS Take 1 capsule by mouth as needed. Take 1 tablet (0.6 mg) by mouth at the onset of a gout attack with next dose no sooner than 3 days later 30 capsule 1   furosemide (LASIX) 20 MG tablet TAKE 3 TABLETS (60 MG TOTAL) BY MOUTH 2 (TWO) TIMES DAILY. (Patient taking differently: Take 60 mg by mouth 2 (two) times daily.)  180 tablet 2   gabapentin (NEURONTIN) 300 MG capsule TAKE 1 CAPSULE (300 MG TOTAL) BY MOUTH 2 (TWO) TIMES DAILY. (Patient taking differently: Take 300 mg by mouth 2 (two) times daily.) 60 capsule 5   glipiZIDE (GLUCOTROL) 5 MG tablet TAKE 0.5 TABLETS (2.5 MG TOTAL) BY MOUTH DAILY BEFORE BREAKFAST. (Patient taking differently: Take 5 mg by mouth daily before breakfast. Take 1 tablet daily) 15 tablet 6   glucose blood (TRUE METRIX BLOOD GLUCOSE TEST) test strip Use as instructed 200 each 12   hydrALAZINE (APRESOLINE) 100 MG tablet Take 1 tablet (100 mg total) by mouth 3 (three) times  daily. 270 tablet 3   isosorbide mononitrate (IMDUR) 60 MG 24 hr tablet TAKE 1 TABLET (60 MG TOTAL) BY MOUTH DAILY. (Patient taking differently: Take 60 mg by mouth daily.) 30 tablet 6   loperamide (IMODIUM A-D) 2 MG tablet Take 2-4 mg by mouth 4 (four) times daily as needed for diarrhea or loose stools.     Misc. Devices (DIGITAL GLASS SCALE) MISC Use scale to weight yourself. Increasing weight may indicate fluid overload 1 each 0   tamsulosin (FLOMAX) 0.4 MG CAPS capsule TAKE 1 CAPSULE (0.4 MG TOTAL) BY MOUTH DAILY. (Patient taking differently: Take 0.4 mg by mouth daily.) 30 capsule 6   TRUEplus Lancets 28G MISC Check blood sugar fasting and at bedtime 210 each 2   albuterol (VENTOLIN HFA) 108 (90 Base) MCG/ACT inhaler INHALE 2 PUFFS INTO THE LUNGS EVERY SIX HOURS AS NEEDED FOR WHEEZING OR SHORTNESS OF BREATH. 18 g 1   metroNIDAZOLE (FLAGYL) 500 MG tablet Take 1 tablet at 3pm, 4pm, and 10pm the day before surgery (Patient not taking: No sig reported) 3 tablet 0   neomycin (MYCIFRADIN) 500 MG tablet Take 2 tablets at 3pm, 4pm, and 10pm the day before surgery (Patient not taking: No sig reported) 6 tablet 0   No facility-administered medications prior to visit.     ROS Review of Systems  Constitutional:  Negative for activity change and appetite change.  HENT:  Negative for sinus pressure and sore throat.   Eyes:  Negative for visual disturbance.  Respiratory:  Negative for cough, chest tightness and shortness of breath.   Cardiovascular:  Negative for chest pain and leg swelling.  Gastrointestinal:  Negative for abdominal distention, abdominal pain, constipation and diarrhea.  Endocrine: Negative.   Genitourinary:  Negative for dysuria.  Musculoskeletal:  Negative for joint swelling and myalgias.  Skin:  Negative for rash.  Allergic/Immunologic: Negative.   Neurological:  Negative for weakness, light-headedness and numbness.  Psychiatric/Behavioral:  Negative for dysphoric mood and  suicidal ideas.    Objective:  BP 111/68   Pulse 64   Ht 6' 1"  (1.854 m)   Wt 221 lb 12.8 oz (100.6 kg)   SpO2 97%   BMI 29.26 kg/m   BP/Weight 01/02/2021 09/06/3417 09/01/2295  Systolic BP 989 - 211  Diastolic BP 68 - 78  Wt. (Lbs) 221.8 236 241  BMI 29.26 31.14 31.8      Physical Exam Constitutional:      Appearance: He is well-developed.  Cardiovascular:     Rate and Rhythm: Normal rate.     Heart sounds: Normal heart sounds. No murmur heard. Pulmonary:     Effort: Pulmonary effort is normal.     Breath sounds: Normal breath sounds. No wheezing or rales.  Chest:     Chest wall: No tenderness.  Abdominal:     General: Bowel sounds are normal.  There is no distension.     Palpations: Abdomen is soft. There is no mass.     Tenderness: There is no abdominal tenderness.     Comments: Colostomy in left upper quadrant of abdomen.  Vertical surgical scar which is healing in superior aspect but inferior aspect of scar with opening and brownish drainage.  No surrounding erythema  Musculoskeletal:        General: Normal range of motion.     Right lower leg: No edema.     Left lower leg: No edema.  Neurological:     Mental Status: He is alert and oriented to person, place, and time.  Psychiatric:        Mood and Affect: Mood normal.    CMP Latest Ref Rng & Units 10/10/2020 09/21/2020 09/07/2020  Glucose 70 - 99 mg/dL 149(H) 97 128(H)  BUN 6 - 20 mg/dL 24(H) 25(H) 27(H)  Creatinine 0.61 - 1.24 mg/dL 2.63(H) 2.63(H) 2.86(H)  Sodium 135 - 145 mmol/L 142 140 141  Potassium 3.5 - 5.1 mmol/L 3.8 3.9 3.5  Chloride 98 - 111 mmol/L 107 110 107  CO2 22 - 32 mmol/L 25 22 23   Calcium 8.9 - 10.3 mg/dL 8.1(L) 8.0(L) 7.0(L)  Total Protein 6.5 - 8.1 g/dL 6.7 6.2(L) 6.2(L)  Total Bilirubin 0.3 - 1.2 mg/dL 0.5 0.7 0.5  Alkaline Phos 38 - 126 U/L 106 95 99  AST 15 - 41 U/L 16 14(L) 15  ALT 0 - 44 U/L 9 9 10     Lipid Panel     Component Value Date/Time   CHOL 167 04/27/2019 1020   TRIG 226  (H) 04/27/2019 1020   HDL 30 (L) 04/27/2019 1020   CHOLHDL 5.6 (H) 04/27/2019 1020   CHOLHDL 5.2 (H) 10/23/2015 1009   VLDL 46 (H) 10/23/2015 1009   LDLCALC 98 04/27/2019 1020    CBC    Component Value Date/Time   WBC 5.3 10/10/2020 1032   WBC 5.9 08/13/2020 2229   RBC 3.59 (L) 10/10/2020 1032   HGB 11.2 (L) 10/10/2020 1032   HGB 14.5 07/11/2016 0949   HCT 35.3 (L) 10/10/2020 1032   HCT 43.0 07/11/2016 0949   PLT 211 10/10/2020 1032   PLT 223 07/11/2016 0949   MCV 98.3 10/10/2020 1032   MCV 89 07/11/2016 0949   MCH 31.2 10/10/2020 1032   MCHC 31.7 10/10/2020 1032   RDW 20.3 (H) 10/10/2020 1032   RDW 13.8 07/11/2016 0949   LYMPHSABS 0.6 (L) 10/10/2020 1032   LYMPHSABS 0.7 07/11/2016 0949   MONOABS 0.9 10/10/2020 1032   EOSABS 0.2 10/10/2020 1032   EOSABS 0.0 07/11/2016 0949   BASOSABS 0.0 10/10/2020 1032   BASOSABS 0.0 07/11/2016 0949   Lab Results  Component Value Date   HGBA1C 6.3 01/02/2021    Assessment & Plan:  1. Type 2 diabetes mellitus with stage 4 chronic kidney disease, without long-term current use of insulin (HCC) Controlled with A1c of 6.3 Will discontinue glipizide to prevent hypoglycemia especially in the setting of his CKD Strict dietary modifications He has diabetic nephropathy and is currently followed by [facility] nephrology Counseled on Diabetic diet, my plate method, 811 minutes of moderate intensity exercise/week Blood sugar logs with fasting goals of 80-120 mg/dl, random of less than 180 and in the event of sugars less than 60 mg/dl or greater than 400 mg/dl encouraged to notify the clinic. Advised on the need for annual eye exams, annual foot exams, Pneumonia vaccine. - POCT glycosylated hemoglobin (Hb  A1C)  2. Rectal cancer ([place]) Status post chemo and radiation Status post resection Followed by oncology and general surgery at [facility]  3. Colostomy in place [facility]) Stable  4. Wound infection after surgery Discharge from  surgical site is concerning Will place on prophylactic antibiotics Advised to keep upcoming appointment with general surgery next week - cephALEXin (KEFLEX) 500 MG capsule; Take 1 capsule (500 mg total) by mouth 2 (two) times daily.  Dispense: 14 capsule; Refill: 0  5. Need for immunization against influenza - Flu Vaccine QUAD 58moIM (Fluarix, Fluzone & Alfiuria Quad PF)  6.  Hypertensive heart disease EF of 47% from lexicon stress test 11/2020 Blood pressure is controlled Continue current regimen  No orders of the defined types were placed in this encounter.   Return in about 3 months (around 04/04/2021) for medical conditions.       ECharlott Rakes MD, FAAFP. [facility] [address]  01/02/2021, 3:29 PM

## 2021-01-03 ENCOUNTER — Encounter: Payer: Self-pay | Admitting: Nurse Practitioner

## 2021-01-03 ENCOUNTER — Emergency Department (HOSPITAL_BASED_OUTPATIENT_CLINIC_OR_DEPARTMENT_OTHER)
Admission: EM | Admit: 2021-01-03 | Discharge: 2021-01-03 | Disposition: A | Discharge status: Other Acute Inpatient Hospital | Payer: Medicaid Other | Attending: Emergency Medicine | Admitting: Emergency Medicine

## 2021-01-03 ENCOUNTER — Inpatient Hospital Stay: Payer: Medicaid Other | Attending: Oncology | Admitting: Nurse Practitioner

## 2021-01-03 ENCOUNTER — Other Ambulatory Visit: Payer: Self-pay

## 2021-01-03 ENCOUNTER — Encounter (HOSPITAL_BASED_OUTPATIENT_CLINIC_OR_DEPARTMENT_OTHER): Payer: Self-pay

## 2021-01-03 ENCOUNTER — Emergency Department (HOSPITAL_BASED_OUTPATIENT_CLINIC_OR_DEPARTMENT_OTHER): Payer: Medicaid Other

## 2021-01-03 VITALS — BP 108/76 | HR 63 | Temp 98.7°F | Resp 18 | Ht 73.0 in | Wt 216.2 lb

## 2021-01-03 DIAGNOSIS — I5032 Chronic diastolic (congestive) heart failure: Secondary | ICD-10-CM | POA: Diagnosis not present

## 2021-01-03 DIAGNOSIS — K651 Peritoneal abscess: Secondary | ICD-10-CM | POA: Diagnosis not present

## 2021-01-03 DIAGNOSIS — Z79899 Other long term (current) drug therapy: Secondary | ICD-10-CM | POA: Insufficient documentation

## 2021-01-03 DIAGNOSIS — I130 Hypertensive heart and chronic kidney disease with heart failure and stage 1 through stage 4 chronic kidney disease, or unspecified chronic kidney disease: Secondary | ICD-10-CM | POA: Diagnosis not present

## 2021-01-03 DIAGNOSIS — I5033 Acute on chronic diastolic (congestive) heart failure: Secondary | ICD-10-CM | POA: Diagnosis not present

## 2021-01-03 DIAGNOSIS — E1122 Type 2 diabetes mellitus with diabetic chronic kidney disease: Secondary | ICD-10-CM | POA: Diagnosis not present

## 2021-01-03 DIAGNOSIS — C20 Malignant neoplasm of rectum: Secondary | ICD-10-CM | POA: Diagnosis not present

## 2021-01-03 DIAGNOSIS — D649 Anemia, unspecified: Secondary | ICD-10-CM | POA: Diagnosis not present

## 2021-01-03 DIAGNOSIS — Z8616 Personal history of COVID-19: Secondary | ICD-10-CM | POA: Diagnosis not present

## 2021-01-03 DIAGNOSIS — N189 Chronic kidney disease, unspecified: Secondary | ICD-10-CM | POA: Insufficient documentation

## 2021-01-03 DIAGNOSIS — T8141XA Infection following a procedure, superficial incisional surgical site, initial encounter: Secondary | ICD-10-CM | POA: Diagnosis not present

## 2021-01-03 DIAGNOSIS — Z85048 Personal history of other malignant neoplasm of rectum, rectosigmoid junction, and anus: Secondary | ICD-10-CM | POA: Insufficient documentation

## 2021-01-03 DIAGNOSIS — J45909 Unspecified asthma, uncomplicated: Secondary | ICD-10-CM | POA: Diagnosis not present

## 2021-01-03 DIAGNOSIS — T8143XA Infection following a procedure, organ and space surgical site, initial encounter: Secondary | ICD-10-CM | POA: Diagnosis not present

## 2021-01-03 DIAGNOSIS — Z20822 Contact with and (suspected) exposure to covid-19: Secondary | ICD-10-CM | POA: Diagnosis not present

## 2021-01-03 DIAGNOSIS — Z1611 Resistance to penicillins: Secondary | ICD-10-CM | POA: Diagnosis not present

## 2021-01-03 DIAGNOSIS — Z7984 Long term (current) use of oral hypoglycemic drugs: Secondary | ICD-10-CM | POA: Diagnosis not present

## 2021-01-03 DIAGNOSIS — K6811 Postprocedural retroperitoneal abscess: Secondary | ICD-10-CM | POA: Diagnosis not present

## 2021-01-03 DIAGNOSIS — N184 Chronic kidney disease, stage 4 (severe): Secondary | ICD-10-CM | POA: Diagnosis not present

## 2021-01-03 DIAGNOSIS — Z933 Colostomy status: Secondary | ICD-10-CM | POA: Diagnosis not present

## 2021-01-03 DIAGNOSIS — M109 Gout, unspecified: Secondary | ICD-10-CM | POA: Diagnosis not present

## 2021-01-03 DIAGNOSIS — Z87891 Personal history of nicotine dependence: Secondary | ICD-10-CM | POA: Diagnosis not present

## 2021-01-03 DIAGNOSIS — R509 Fever, unspecified: Secondary | ICD-10-CM | POA: Diagnosis present

## 2021-01-03 LAB — COMPREHENSIVE METABOLIC PANEL
ALT: 15 U/L (ref 0–44)
AST: 10 U/L — ABNORMAL LOW (ref 15–41)
Albumin: 3.6 g/dL (ref 3.5–5.0)
Alkaline Phosphatase: 97 U/L (ref 38–126)
Anion gap: 11 (ref 5–15)
BUN: 56 mg/dL — ABNORMAL HIGH (ref 6–20)
CO2: 23 mmol/L (ref 22–32)
Calcium: 8.5 mg/dL — ABNORMAL LOW (ref 8.9–10.3)
Chloride: 105 mmol/L (ref 98–111)
Creatinine, Ser: 3.59 mg/dL — ABNORMAL HIGH (ref 0.61–1.24)
GFR, Estimated: 19 mL/min — ABNORMAL LOW (ref >60)
Glucose, Bld: 101 mg/dL — ABNORMAL HIGH (ref 70–99)
Potassium: 4 mmol/L (ref 3.5–5.1)
Sodium: 139 mmol/L (ref 135–145)
Total Bilirubin: 0.6 mg/dL (ref 0.3–1.2)
Total Protein: 7.3 g/dL (ref 6.5–8.1)

## 2021-01-03 LAB — CBC WITH DIFFERENTIAL/PLATELET
Abs Immature Granulocytes: 0.02 10*3/uL (ref 0.00–0.07)
Basophils Absolute: 0 10*3/uL (ref 0.0–0.1)
Basophils Relative: 0 %
Eosinophils Absolute: 0.2 10*3/uL (ref 0.0–0.5)
Eosinophils Relative: 3 %
HCT: 34.3 % — ABNORMAL LOW (ref 39.0–52.0)
Hemoglobin: 11 g/dL — ABNORMAL LOW (ref 13.0–17.0)
Immature Granulocytes: 0 %
Lymphocytes Relative: 11 %
Lymphs Abs: 0.7 10*3/uL (ref 0.7–4.0)
MCH: 28 pg (ref 26.0–34.0)
MCHC: 32.1 g/dL (ref 30.0–36.0)
MCV: 87.3 fL (ref 80.0–100.0)
Monocytes Absolute: 0.6 10*3/uL (ref 0.1–1.0)
Monocytes Relative: 9 %
Neutro Abs: 4.5 10*3/uL (ref 1.7–7.7)
Neutrophils Relative %: 77 %
Platelets: 248 10*3/uL (ref 150–400)
RBC: 3.93 MIL/uL — ABNORMAL LOW (ref 4.22–5.81)
RDW: 14.5 % (ref 11.5–15.5)
WBC: 5.9 10*3/uL (ref 4.0–10.5)
nRBC: 0 % (ref 0.0–0.2)

## 2021-01-03 LAB — LIPASE, BLOOD: Lipase: 92 U/L — ABNORMAL HIGH (ref 11–51)

## 2021-01-03 LAB — RESP PANEL BY RT-PCR (FLU A&B, COVID) ARPGX2
Influenza A by PCR: NEGATIVE
Influenza B by PCR: NEGATIVE
SARS Coronavirus 2 by RT PCR: NEGATIVE

## 2021-01-03 LAB — LACTIC ACID, PLASMA: Lactic Acid, Venous: 1.2 mmol/L (ref 0.5–1.9)

## 2021-01-03 MED ORDER — VANCOMYCIN HCL 2000 MG/400ML IV SOLN: 2000.0000 mg | Freq: Once | INTRAVENOUS | Status: DC

## 2021-01-03 MED ORDER — MORPHINE SULFATE (PF) 4 MG/ML IV SOLN
4.0000 mg | Freq: Once | INTRAVENOUS | Status: AC
Start: 2021-01-03 — End: 2021-01-03
  Administered 2021-01-03: 4 mg via INTRAVENOUS
  Filled 2021-01-03: qty 1

## 2021-01-03 MED ORDER — VANCOMYCIN HCL IN DEXTROSE 1-5 GM/200ML-% IV SOLN
1000.0000 mg | Freq: Once | INTRAVENOUS | Status: AC
  Administered 2021-01-03: 1000 mg via INTRAVENOUS
  Filled 2021-01-03: qty 200

## 2021-01-03 MED ORDER — SODIUM CHLORIDE 0.9 % IV BOLUS
1000.0000 mL | Freq: Once | INTRAVENOUS | Status: AC
  Administered 2021-01-03: 1000 mL via INTRAVENOUS

## 2021-01-03 MED ORDER — PIPERACILLIN-TAZOBACTAM 3.375 G IVPB 30 MIN
3.3750 g | Freq: Once | INTRAVENOUS | Status: AC
  Administered 2021-01-03: 3.375 g via INTRAVENOUS
  Filled 2021-01-03: qty 50

## 2021-01-03 MED ORDER — ONDANSETRON HCL 4 MG/2ML IJ SOLN
4.0000 mg | Freq: Once | INTRAMUSCULAR | Status: AC
  Administered 2021-01-03: 4 mg via INTRAVENOUS
  Filled 2021-01-03: qty 2

## 2021-01-03 NOTE — ED Notes (Addendum)
Called [facility] to transport patient to [facility] accepting is Dr Harrell Gave Manthy--patient going to room# 2102--call report to [phone]

## 2021-01-03 NOTE — ED Notes (Signed)
Pt refused nasal swab

## 2021-01-03 NOTE — Progress Notes (Signed)
Craig West   Diagnosis: Rectal cancer  INTERVAL HISTORY:   Craig West returns for follow-up.  He underwent low anterior resection with diverting colostomy 12/06/2020.  He is not feeling well.  He reports shaking chills.  No fever but states he has no way to check his temperature.  Main complaint is an excessive amount of drainage from the midline abdominal wound since staples were removed 1 day last week.  He has having shaking chills.  Colostomy is functioning.  No nausea or vomiting.  He estimates losing 26 pounds since surgery.  Appetite varies.  Complains of significant pain at the rectum.  He began cephalexin yesterday.  Objective:  Vital signs in last 24 hours:  Blood pressure 108/76, pulse 63, temperature 98.7 F (37.1 C), temperature source Oral, resp. rate 18, height 6\' 1"  (1.854 m), weight 216 lb 3.2 oz (98.1 kg), SpO2 96 %.   Ill-appearing, actively experiencing rigors. HEENT: No thrush or ulcers. Resp: Lungs clear bilaterally. Cardio: Regular rate and rhythm. GI: Left abdomen colostomy.  No fluctuance at the perineum.  Perianal skin/perineum moist appearing. Vascular: No leg edema. Neuro: Alert and oriented. Skin: Purulent drainage lower aspect of midline abdominal surgical incision.   Lab Results:  Lab Results  Component Value Date   WBC 5.3 10/10/2020   HGB 11.2 (L) 10/10/2020   HCT 35.3 (L) 10/10/2020   MCV 98.3 10/10/2020   PLT 211 10/10/2020   NEUTROABS 3.6 10/10/2020    Imaging:  No results found.  Medications: I have reviewed the patient's current medications.  Assessment/Plan: Rectal cancer-rectal mass at 5-6 cm from the anal verge on colonoscopy 01/11/2020, biopsy confirmed adenocarcinoma CTs 01/11/2020-rectal thickening, small perirectal lymph nodes and external iliac nodes, moderate right pleural effusion MRI pelvis 01/27/2020-T3bN2b tumor at 7.1 cm from the internal anal sphincter, multiple mesorectal nodes,  indistinct left pelvic sidewall node and enlarged bilateral external iliac nodes PET scan 02/11/2020-hypermetabolic rectosigmoid mass, hypermetabolic mesorectal and sigmoid mesocolon lymph nodes, left external iliac/obturator node with mild hypermetabolism, 18 mm left inguinal node with normal architecture and slight hypermetabolism Radiation and infusional 5-FU 02/21/2020-04/04/2020 CTs 05/09/2020-decreased perirectal and sigmoid mesocolon lymphadenopathy, no evidence of disease progression MRI pelvis 05/17/2020-no well-defined residual rectal mass seen.  Wall appears slightly irregular.  No perirectal extension of tumor.  Again identified is perirectal adenopathy, similar to most recent CT 05/10/2020 but improved compared to PET/CT of 02/11/2020. Cycle 1 FOLFOX 06/14/2020 06/27/2020 chemotherapy held due to hypertension Cycle 2 FOLFOX 06/29/2020 Cycle 3 FOLFOX 07/13/2020 Cycle 4 FOLFOX 07/27/2020 Cycle 5 FOLFOX 08/09/2020 Cycle 6 FOLFOX 08/24/2020 Cycle 7 FOLFOX 09/07/2020 Cycle 8 FOLFOX 09/21/2020 MRI scheduled 10/16/2020 12/06/2020 open low anterior resection with diverting loop colostomy, Dr. Hester Mates; pathology-rectal tumor location entirely below anterior peritoneal reflection; adenocarcinoma; minimal residual tumor; invades submucosa; no macroscopic tumor perforation; no lymphovascular invasion; no perineural invasion; treatment effect present with single cells or rare small groups of cancer cells (near complete response, score 1); all margins negative for invasive carcinoma; 2 of 13 lymph nodes positive; pT1, pN1b Anemia Chronic renal failure Congestive heart failure-severe LVH Diabetes Gout Peripheral neuropathy? Hypertension  Disposition: Craig West underwent a low anterior resection with diverting loop colostomy at [facility] on 12/06/2020.  Dr. Benay Spice reviewed the pathology report with him.    He is ill-appearing and actively experiencing rigors.  He has purulent drainage from the lower aspect of the  midline surgical incision.  Blood pressure is lower than typical baseline.  Dr. Benay Spice spoke  with the emergency department physician at [facility].  They understand we are concerned for sepsis.  We are transferring him to the emergency department for further evaluation/initiation of treatment.  Patient seen with Dr. Benay Spice.  [name] ANP/GNP-BC   01/03/2021  3:21 PM This was a shared visit with [name].  Craig West was interviewed and examined.  He underwent a low anterior resection at [facility] on 12/06/2020.  We reviewed the pathology report with Craig West.  He had a response to neoadjuvant therapy.  I do not recommend adjuvant therapy.  He presents today with chills and hypotension.  He appears ill.  There is purulent drainage from the midline wound.  I contacted the emergency room physician.  He will be transferred to the emergency room for further evaluation and management of apparent sepsis syndrome.  I was present for greater than 80% of today's visit.  I performed medical decision making.  [name], MD

## 2021-01-03 NOTE — ED Notes (Signed)
Pt's CT scan was sent to [facility]iology at 7:00 pm on 01-03-2021 by Crissie Figures.

## 2021-01-03 NOTE — ED Provider Notes (Signed)
[facility] EMERGENCY DEPT Provider Note   CSN: [id number] Arrival date & time: 01/03/21  1512     History Chief Complaint  Patient presents with   Fever    Craig West is a 55 y.o. male.  55 Yo M with a significant past medical history of rectal cancer that had a recent colectomy placed comes in with a chief complaints of purulent drainage from the inferior aspect of his surgical incision and chills at home.  No measured fevers.  Had seen his family doctor yesterday who started him on oral antibiotics.  He went to the oncology clinic today and they sent him down here for evaluation.  He denies cough or congestion denies urinary symptoms has been able to eat and drink but a little bit less.   Fever Associated symptoms: chills   Associated symptoms: no chest pain, no confusion, no congestion, no diarrhea, no headaches, no myalgias, no rash and no vomiting       Past Medical History:  Diagnosis Date   Asthma    CHF (congestive heart failure) (HCC)    CKD (chronic kidney disease), stage IV ([place])    COVID-19 virus infection 04/2019   Diabetes mellitus without complication (HCC)    Fatigue 11/23/2020   Gout    Hypertension    Leg heaviness 11/23/2020   NSVT (nonsustained ventricular tachycardia) 11/23/2020   Rectal cancer [facility])     Patient Active Problem List   Diagnosis Date Noted   Fatigue 11/23/2020   Leg heaviness 11/23/2020   NSVT (nonsustained ventricular tachycardia) 11/23/2020   Diabetes mellitus without complication ([place])    Hypertension    CKD (chronic kidney disease), stage IV ([place])    PICC (peripherally inserted central catheter) in place 03/27/2020   Rectal cancer ([place]) 01/27/2020   Benign neoplasm of ascending colon    Benign neoplasm of descending colon    Benign neoplasm of sigmoid colon    Rectal mass    Rectal bleeding    Iron deficiency anemia    Chronic diastolic heart failure (HCC)    Hyperkalemia    Volume overload 62/83/1517    Acute diastolic heart failure ([place])    Demand ischemia (HCC)    Hypertensive urgency 12/23/2019   COVID-19 virus infection 04/2019   Obesity (BMI 30.0-34.9) 12/09/2017   Non compliance w medication regimen 04/18/2017   Gout 08/02/2016   Diabetic neuropathy ([place]) 08/02/2016   Hyperlipidemia 10/24/2015   Asthma 10/16/2015   Type 2 diabetes mellitus without complication ([place]) 61/60/7371   Essential hypertension 06/13/2014   Smoker 06/13/2014    Past Surgical History:  Procedure Laterality Date   BIOPSY  01/11/2020   Procedure: BIOPSY;  Surgeon: Irene Shipper, MD;  Location: [place];  Service: Endoscopy;;   COLONOSCOPY WITH PROPOFOL N/A 01/11/2020   Procedure: COLONOSCOPY WITH PROPOFOL;  Surgeon: Irene Shipper, MD;  Location[facility] ENDOSCOPY;  Service: Endoscopy;  Laterality: N/A;   NO PAST SURGERIES     POLYPECTOMY  01/11/2020   Procedure: POLYPECTOMY;  Surgeon: Irene Shipper, MD;  Location: [place];  Service: Endoscopy;;   SUBMUCOSAL TATTOO INJECTION  01/11/2020   Procedure: SUBMUCOSAL TATTOO INJECTION;  Surgeon: Irene Shipper, MD;  Location: [facility] ENDOSCOPY;  Service: Endoscopy;;       Family History  Problem Relation Age of Onset   Heart failure Brother     Social History   Tobacco Use   Smoking status: Former    Packs/day: 0.50    Types:  Cigarettes    Quit date: 10/27/2019    Years since quitting: 1.1   Smokeless tobacco: Never  Vaping Use   Vaping Use: Never used  Substance Use Topics   Alcohol use: Yes    Alcohol/week: 0.0 standard drinks    Comment: occ   Drug use: No    Home Medications Prior to Admission medications   Medication Sig Start Date End Date Taking? Authorizing Provider  albuterol (VENTOLIN HFA) 108 (90 Base) MCG/ACT inhaler INHALE 2 PUFFS INTO THE LUNGS EVERY SIX HOURS AS NEEDED FOR WHEEZING OR SHORTNESS OF BREATH. 12/27/19 01/03/21 Yes Andrew Au, MD  allopurinol (ZYLOPRIM) 100 MG tablet Take 200 mg by mouth daily.   Yes [provider]  amLODipine (NORVASC) 10 MG tablet Take 1 tablet (10 mg total) by mouth daily. 11/30/20  Yes Skeet Latch, MD  atorvastatin (LIPITOR) 40 MG tablet TAKE 1 TABLET (40 MG TOTAL) BY MOUTH DAILY. FOR HYPERCHOLESTEROLEMIA Patient taking differently: Take 40 mg by mouth daily. 05/25/20 05/25/21 Yes Charlott Rakes, MD  carvedilol (COREG) 25 MG tablet Take 1 tablet (25 mg total) by mouth 2 (two) times daily with a meal. 11/30/20  Yes Skeet Latch, MD  cephALEXin (KEFLEX) 500 MG capsule Take 1 capsule (500 mg total) by mouth 2 (two) times daily. 01/02/21  Yes Charlott Rakes, MD  Colchicine (MITIGARE) 0.6 MG CAPS Take 1 capsule by mouth as needed. Take 1 tablet (0.6 mg) by mouth at the onset of a gout attack with next dose no sooner than 3 days later 11/30/20  Yes Skeet Latch, MD  furosemide (LASIX) 20 MG tablet TAKE 3 TABLETS (60 MG TOTAL) BY MOUTH 2 (TWO) TIMES DAILY. Patient taking differently: Take 60 mg by mouth 2 (two) times daily. 05/25/20 05/25/21 Yes Newlin, Charlane Ferretti, MD  gabapentin (NEURONTIN) 300 MG capsule TAKE 1 CAPSULE (300 MG TOTAL) BY MOUTH 2 (TWO) TIMES DAILY. Patient taking differently: Take 300 mg by mouth 2 (two) times daily. 04/05/20 04/05/21 Yes McClung, Dionne Bucy, PA-C  hydrALAZINE (APRESOLINE) 100 MG tablet Take 1 tablet (100 mg total) by mouth 3 (three) times daily. 11/30/20  Yes Skeet Latch, MD  isosorbide mononitrate (IMDUR) 60 MG 24 hr tablet TAKE 1 TABLET (60 MG TOTAL) BY MOUTH DAILY. Patient taking differently: Take 60 mg by mouth daily. 05/25/20 05/25/21 Yes Newlin, Charlane Ferretti, MD  tamsulosin (FLOMAX) 0.4 MG CAPS capsule TAKE 1 CAPSULE (0.4 MG TOTAL) BY MOUTH DAILY. Patient taking differently: Take 0.4 mg by mouth daily. 05/25/20 05/25/21 Yes Charlott Rakes, MD  Blood Glucose Monitoring Suppl (TRUE METRIX METER) w/Device KIT Check blood sugar fasting and ant bedtime and record 05/15/17   Charlott Rakes, MD  glucose blood (TRUE METRIX BLOOD GLUCOSE TEST) test strip  Use as instructed 12/27/19   Andrew Au, MD  Misc. Devices (DIGITAL GLASS SCALE) MISC Use scale to weight yourself. Increasing weight may indicate fluid overload 12/27/19   Andrew Au, MD  TRUEplus Lancets 28G MISC Check blood sugar fasting and at bedtime 12/27/19   Andrew Au, MD    Allergies    Patient has no known allergies.  Review of Systems   Review of Systems  Constitutional:  Positive for chills. Negative for fever.  HENT:  Negative for congestion and facial swelling.   Eyes:  Negative for discharge and visual disturbance.  Respiratory:  Negative for shortness of breath.   Cardiovascular:  Negative for chest pain and palpitations.  Gastrointestinal:  Negative for abdominal pain, diarrhea and vomiting.  Musculoskeletal:  Negative for arthralgias and myalgias.  Skin:  Negative for color change and rash.  Neurological:  Negative for tremors, syncope and headaches.  Psychiatric/Behavioral:  Negative for confusion and dysphoric mood.    Physical Exam Updated Vital Signs BP 119/70   Pulse 70   Temp 98.3 F (36.8 C) (Oral)   Resp 16   Ht 6' 1"  (1.854 m)   Wt 98.1 kg   SpO2 100%   BMI 28.52 kg/m   Physical Exam Vitals and nursing note reviewed.  Constitutional:      Appearance: He is well-developed.  HENT:     Head: Normocephalic and atraumatic.  Eyes:     Pupils: Pupils are equal, round, and reactive to light.  Neck:     Vascular: No JVD.  Cardiovascular:     Rate and Rhythm: Normal rate and regular rhythm.     Heart sounds: No murmur heard.   No friction rub. No gallop.  Pulmonary:     Effort: No respiratory distress.     Breath sounds: No wheezing.  Abdominal:     General: There is no distension.     Tenderness: There is no abdominal tenderness. There is no guarding or rebound.     Comments: Incision of the lower aspect of the abdomen has purulent drainage.  No obvious surrounding erythema no obvious tenderness on palpation.  Colostomy bag in sites  and appears to be draining normally.  Musculoskeletal:        General: Normal range of motion.     Cervical back: Normal range of motion and neck supple.  Skin:    Coloration: Skin is not pale.     Findings: No rash.  Neurological:     Mental Status: He is alert and oriented to person, place, and time.  Psychiatric:        Behavior: Behavior normal.    ED Results / Procedures / Treatments   Labs (all labs ordered are listed, but only abnormal results are displayed) Labs Reviewed  CBC WITH DIFFERENTIAL/PLATELET - Abnormal; Notable for the following components:      Result Value   RBC 3.93 (*)    Hemoglobin 11.0 (*)    HCT 34.3 (*)    All other components within normal limits  COMPREHENSIVE METABOLIC PANEL - Abnormal; Notable for the following components:   Glucose, Bld 101 (*)    BUN 56 (*)    Creatinine, Ser 3.59 (*)    Calcium 8.5 (*)    AST 10 (*)    GFR, Estimated 19 (*)    All other components within normal limits  LIPASE, BLOOD - Abnormal; Notable for the following components:   Lipase 92 (*)    All other components within normal limits  RESP PANEL BY RT-PCR (FLU A&B, COVID) ARPGX2  CULTURE, BLOOD (ROUTINE X 2)  CULTURE, BLOOD (ROUTINE X 2)  LACTIC ACID, PLASMA    EKG None  Radiology CT ABDOMEN PELVIS WO CONTRAST  Result Date: 01/03/2021 CLINICAL DATA:  Evaluate for intra-abdominal abscess. Recent surgery. EXAM: CT ABDOMEN AND PELVIS WITHOUT CONTRAST TECHNIQUE: Multidetector CT imaging of the abdomen and pelvis was performed following the standard protocol without IV contrast. COMPARISON:  CT abdomen and pelvis 08/14/2020. FINDINGS: Lower chest: No acute abnormality. Hepatobiliary: No focal liver abnormality is seen. No gallstones, gallbladder wall thickening, or biliary dilatation. Pancreas: Unremarkable. No pancreatic ductal dilatation or surrounding inflammatory changes. Spleen: Normal in size without focal abnormality. Adrenals/Urinary Tract: Mildly hyperdense  rounded right  renal lesion measuring 17 mm appears similar to the prior study. There is a rounded hypodensity in the left kidney which is too small to characterize, left renal hypodensities are too small to characterize, likely cysts. These are unchanged from prior. There is no hydronephrosis or perinephric fluid. No urinary tract calculi. Adrenal glands are within normal limits. Mild inflammatory stranding surrounding the bladder. Stomach/Bowel: Patient is status post interval bowel surgery, likely partial colectomy. There is a left lower quadrant ostomy, new from prior examination. Anastomotic suture line noted in the rectosigmoid region. There is an air-fluid collection in the presacral space extending from the level of the anus superiorly to the level of S2. There is mild surrounding inflammatory stranding. This collection measures 6.1 x 2.7 by 8.2 cm in largest transverse, AP and craniocaudad dimensions respectively. There is some colonic diverticulosis without evidence for diverticulitis. There is no bowel obstruction. The appendix is within normal limits. Small bowel and stomach appear within normal limits. Vascular/Lymphatic: No significant vascular findings are present. No enlarged abdominal or pelvic lymph nodes. Reproductive: Prostate is unremarkable. Other: There is no free intraperitoneal air. There is mild haziness of mesenteric fat in the anterior left lower quadrant likely related to recent surgery. Midline abdominal wound is present. There is no abdominal wall fluid collection identified. There are small fat containing bilateral inguinal hernias. Musculoskeletal: Degenerative changes affect the spine. IMPRESSION: 1. Status post recent partial colonic resection with anastomosis at the level the rectum. Left lower quadrant colostomy. No bowel obstruction. 2. Air-fluid collection in the presacral space extending from the level of the anus to the level of S2 worrisome for abscess or contained  perforation. 3. Mild inflammation of the bladder worrisome for cystitis. 4. Stable indeterminate right renal lesion. Follow-up renal mass CT protocol and/or MRI recommended. Electronically Signed   By: Ronney Asters M.D.   On: 01/03/2021 17:23    Procedures Procedures   Medications Ordered in ED Medications  sodium chloride 0.9 % bolus 1,000 mL (0 mLs Intravenous Stopped 01/03/21 1729)  piperacillin-tazobactam (ZOSYN) IVPB 3.375 g (0 g Intravenous Stopped 01/03/21 1700)  vancomycin (VANCOCIN) IVPB 1000 mg/200 mL premix (0 mg Intravenous Stopped 01/03/21 1744)    Followed by  vancomycin (VANCOCIN) IVPB 1000 mg/200 mL premix (1,000 mg Intravenous New Bag/Given 01/03/21 1746)  morphine 4 MG/ML injection 4 mg (4 mg Intravenous Given 01/03/21 1634)  ondansetron (ZOFRAN) injection 4 mg (4 mg Intravenous Given 01/03/21 1633)    ED Course  I have reviewed the triage vital signs and the nursing notes.  Pertinent labs & imaging results that were available during my care of the patient were reviewed by me and considered in my medical decision making (see chart for details).    MDM Rules/Calculators/A&P                           55 yo M with a cc of purulent drainage from his surgical incision.  Having chills at home.  Grossly purulent discharge on my exam.  Will obtain a CT scan.  Blood work.  No leukocytosis, AKI.  Lactate normal.  CT scan with a fairly large abscess in the presacral space.  I discussed this with his surgeon at [facility] who accepts him in transfer.  CRITICAL CARE Performed by: Cecilio Asper   Total critical care time: 35 minutes  Critical care time was exclusive of separately billable procedures and treating other patients.  Critical care was necessary to  treat or prevent imminent or life-threatening deterioration.  Critical care was time spent personally by me on the following activities: development of treatment plan with patient and/or surrogate as well as nursing,  discussions with consultants, evaluation of patient's response to treatment, examination of patient, obtaining history from patient or surrogate, ordering and performing treatments and interventions, ordering and review of laboratory studies, ordering and review of radiographic studies, pulse oximetry and re-evaluation of patient's condition.  The patients results and plan were reviewed and discussed.   Any x-rays performed were independently reviewed by myself.   Differential diagnosis were considered with the presenting HPI.  Medications  sodium chloride 0.9 % bolus 1,000 mL (0 mLs Intravenous Stopped 01/03/21 1729)  piperacillin-tazobactam (ZOSYN) IVPB 3.375 g (0 g Intravenous Stopped 01/03/21 1700)  vancomycin (VANCOCIN) IVPB 1000 mg/200 mL premix (0 mg Intravenous Stopped 01/03/21 1744)    Followed by  vancomycin (VANCOCIN) IVPB 1000 mg/200 mL premix (1,000 mg Intravenous New Bag/Given 01/03/21 1746)  morphine 4 MG/ML injection 4 mg (4 mg Intravenous Given 01/03/21 1634)  ondansetron (ZOFRAN) injection 4 mg (4 mg Intravenous Given 01/03/21 1633)    Vitals:   01/03/21 1930 01/03/21 2000 01/03/21 2130 01/03/21 2200  BP: (!) 136/94 (!) 103/57 133/89 119/70  Pulse: 73 69 70   Resp: 20 20 14 16   Temp: 98.5 F (36.9 C)   98.3 F (36.8 C)  TempSrc: Oral   Oral  SpO2: 100% 100% 100% 100%  Weight:      Height:        Final diagnoses:  Pelvic abscess in male [facility])    Admission/ observation were discussed with the admitting physician, patient and/or family and they are comfortable with the plan.    Final Clinical Impression(s) / ED Diagnoses Final diagnoses:  Pelvic abscess in male [facility])    Rx / DC Orders ED Discharge Orders     None        Deno Etienne, DO 01/03/21 2331

## 2021-01-03 NOTE — ED Notes (Signed)
Report given to [facility] and Charlsie Merles RN at [facility].

## 2021-01-03 NOTE — ED Triage Notes (Signed)
Pt arrive A/OX4 from cancer center clinic c/o fever, chills and fatigue. Pt reports he had colon surgery sept 7 2022, sutures were removed last Tuesday and has since had purulent drainage from the bottom of the surgical site. VSS at this time

## 2021-01-03 NOTE — ED Notes (Signed)
Pt has been transferred to [facility] via [facility]

## 2021-01-06 DIAGNOSIS — C20 Malignant neoplasm of rectum: Secondary | ICD-10-CM | POA: Diagnosis not present

## 2021-01-08 LAB — CULTURE, BLOOD (ROUTINE X 2)
Culture: NO GROWTH
Culture: NO GROWTH
Special Requests: ADEQUATE
Special Requests: ADEQUATE

## 2021-01-23 ENCOUNTER — Other Ambulatory Visit: Payer: Self-pay

## 2021-01-25 ENCOUNTER — Telehealth: Payer: Self-pay | Admitting: Oncology

## 2021-01-25 NOTE — Telephone Encounter (Signed)
Scheduled appt per 10/26 sch msg - patient is aware of appt date and time

## 2021-01-30 ENCOUNTER — Other Ambulatory Visit: Payer: Self-pay

## 2021-02-02 ENCOUNTER — Inpatient Hospital Stay: Payer: Medicaid Other | Attending: Oncology | Admitting: Nurse Practitioner

## 2021-02-02 ENCOUNTER — Other Ambulatory Visit: Payer: Self-pay

## 2021-02-02 ENCOUNTER — Inpatient Hospital Stay: Payer: Medicaid Other

## 2021-02-02 VITALS — BP 120/82 | HR 73 | Temp 97.7°F | Resp 19 | Wt 219.6 lb

## 2021-02-02 DIAGNOSIS — C20 Malignant neoplasm of rectum: Secondary | ICD-10-CM

## 2021-02-02 DIAGNOSIS — B9620 Unspecified Escherichia coli [E. coli] as the cause of diseases classified elsewhere: Secondary | ICD-10-CM | POA: Insufficient documentation

## 2021-02-02 DIAGNOSIS — N189 Chronic kidney disease, unspecified: Secondary | ICD-10-CM | POA: Insufficient documentation

## 2021-02-02 DIAGNOSIS — I129 Hypertensive chronic kidney disease with stage 1 through stage 4 chronic kidney disease, or unspecified chronic kidney disease: Secondary | ICD-10-CM | POA: Diagnosis not present

## 2021-02-02 DIAGNOSIS — D649 Anemia, unspecified: Secondary | ICD-10-CM | POA: Diagnosis not present

## 2021-02-02 DIAGNOSIS — E1122 Type 2 diabetes mellitus with diabetic chronic kidney disease: Secondary | ICD-10-CM | POA: Insufficient documentation

## 2021-02-02 LAB — CMP (CANCER CENTER ONLY)
ALT: 17 U/L (ref 0–44)
AST: 17 U/L (ref 15–41)
Albumin: 3.7 g/dL (ref 3.5–5.0)
Alkaline Phosphatase: 121 U/L (ref 38–126)
Anion gap: 10 (ref 5–15)
BUN: 63 mg/dL — ABNORMAL HIGH (ref 6–20)
CO2: 24 mmol/L (ref 22–32)
Calcium: 8.3 mg/dL — ABNORMAL LOW (ref 8.9–10.3)
Chloride: 105 mmol/L (ref 98–111)
Creatinine: 3.15 mg/dL (ref 0.61–1.24)
GFR, Estimated: 22 mL/min — ABNORMAL LOW (ref >60)
Glucose, Bld: 143 mg/dL — ABNORMAL HIGH (ref 70–99)
Potassium: 4.7 mmol/L (ref 3.5–5.1)
Sodium: 139 mmol/L (ref 135–145)
Total Bilirubin: 0.5 mg/dL (ref 0.3–1.2)
Total Protein: 7.1 g/dL (ref 6.5–8.1)

## 2021-02-02 LAB — CBC WITH DIFFERENTIAL (CANCER CENTER ONLY)
Abs Immature Granulocytes: 0.02 10*3/uL (ref 0.00–0.07)
Basophils Absolute: 0 10*3/uL (ref 0.0–0.1)
Basophils Relative: 0 %
Eosinophils Absolute: 0.3 10*3/uL (ref 0.0–0.5)
Eosinophils Relative: 4 %
HCT: 35.1 % — ABNORMAL LOW (ref 39.0–52.0)
Hemoglobin: 11 g/dL — ABNORMAL LOW (ref 13.0–17.0)
Immature Granulocytes: 0 %
Lymphocytes Relative: 12 %
Lymphs Abs: 0.8 10*3/uL (ref 0.7–4.0)
MCH: 27.5 pg (ref 26.0–34.0)
MCHC: 31.3 g/dL (ref 30.0–36.0)
MCV: 87.8 fL (ref 80.0–100.0)
Monocytes Absolute: 0.6 10*3/uL (ref 0.1–1.0)
Monocytes Relative: 9 %
Neutro Abs: 5 10*3/uL (ref 1.7–7.7)
Neutrophils Relative %: 75 %
Platelet Count: 218 10*3/uL (ref 150–400)
RBC: 4 MIL/uL — ABNORMAL LOW (ref 4.22–5.81)
RDW: 16.9 % — ABNORMAL HIGH (ref 11.5–15.5)
WBC Count: 6.7 10*3/uL (ref 4.0–10.5)
nRBC: 0 % (ref 0.0–0.2)

## 2021-02-02 NOTE — Progress Notes (Signed)
CRITICAL VALUE STICKER  CRITICAL VALUE: Creatinine 3.15  RECEIVER (on-site recipient of call):Alezander Dimaano,RN  DATE & TIME NOTIFIED: 02/02/21 @ 0901  MESSENGER (representative from lab):Phyllis  MD NOTIFIED: [name], NP  TIME OF NOTIFICATION:0903  RESPONSE:  No change. This is improved from last creatinine.

## 2021-02-02 NOTE — Progress Notes (Signed)
Craig West OFFICE PROGRESS NOTE   Diagnosis: Rectal cancer  INTERVAL HISTORY:   Craig West returns for follow-up.  At his last office visit 01/03/2021 he was experiencing chills and hypotension.  There was purulent drainage from the midline wound.  He was transferred to the emergency department for additional evaluation/management.  CT scan showed a large abscess in the presacral space.  He was transferred to [facility].  He underwent CT-guided percutaneous drain placement 01/04/2021.  E. coli was cultured, sensitive to ciprofloxacin.  The drain was accidentally dislodged by Craig West on 01/07/2021.  Repeat CT on 01/08/2021 showed near resolution of the fluid component of presacral collection with persistent thick soft tissue rind, not amenable to drainage.  Drain was not replaced.  He was seen in follow-up at [facility] on 01/22/2021.  He was felt to be doing much better, wound clean and near healed.  Plan is for GGE in 7 to 8 weeks, follow-up with Craig West for consideration of reversal.  He is feeling better.  No fever.  He continues to have mild pain in the rectum.  He is packing the abdominal wound.  He continues to note drainage.  He reports a good appetite.  He reports persistent numbness in the fingertips and toes with intermittent "sharp pain".  Objective:  Vital signs in last 24 hours:  Blood pressure 120/82, pulse 73, temperature 97.7 F (36.5 C), temperature source Temporal, resp. rate 19, weight 219 lb 9.6 oz (99.6 kg), SpO2 99 %.   Lymphatics: No palpable cervical, supraclavicular, axillary or inguinal lymph nodes. Resp: Lungs clear bilaterally. Cardio: Regular rate and rhythm. GI: Abdomen soft and nontender.  No hepatomegaly.  Left abdomen colostomy.  Midline wound with small opening inferior portion of scar, packing in place. Vascular: No leg edema.   Lab Results:  Lab Results  Component Value Date   WBC 6.7 02/02/2021   HGB 11.0 (L) 02/02/2021   HCT 35.1 (L)  02/02/2021   MCV 87.8 02/02/2021   PLT 218 02/02/2021   NEUTROABS 5.0 02/02/2021    Imaging:  No results found.  Medications: I have reviewed the patient's current medications.  Assessment/Plan: Rectal cancer-rectal mass at 5-6 cm from the anal verge on colonoscopy 01/11/2020, biopsy confirmed adenocarcinoma CTs 01/11/2020-rectal thickening, small perirectal lymph nodes and external iliac nodes, moderate right pleural effusion MRI pelvis 01/27/2020-T3bN2b tumor at 7.1 cm from the internal anal sphincter, multiple mesorectal nodes, indistinct left pelvic sidewall node and enlarged bilateral external iliac nodes PET scan 02/11/2020-hypermetabolic rectosigmoid mass, hypermetabolic mesorectal and sigmoid mesocolon lymph nodes, left external iliac/obturator node with mild hypermetabolism, 18 mm left inguinal node with normal architecture and slight hypermetabolism Radiation and infusional 5-FU 02/21/2020-04/04/2020 CTs 05/09/2020-decreased perirectal and sigmoid mesocolon lymphadenopathy, no evidence of disease progression MRI pelvis 05/17/2020-no well-defined residual rectal mass seen.  Wall appears slightly irregular.  No perirectal extension of tumor.  Again identified is perirectal adenopathy, similar to most recent CT 05/10/2020 but improved compared to PET/CT of 02/11/2020. Cycle 1 FOLFOX 06/14/2020 06/27/2020 chemotherapy held due to hypertension Cycle 2 FOLFOX 06/29/2020 Cycle 3 FOLFOX 07/13/2020 Cycle 4 FOLFOX 07/27/2020 Cycle 5 FOLFOX 08/09/2020 Cycle 6 FOLFOX 08/24/2020 Cycle 7 FOLFOX 09/07/2020 Cycle 8 FOLFOX 09/21/2020 MRI scheduled 10/16/2020 12/06/2020 open low anterior resection with diverting loop colostomy, Craig West; pathology-rectal tumor location entirely below anterior peritoneal reflection; adenocarcinoma; minimal residual tumor; invades submucosa; no macroscopic tumor perforation; no lymphovascular invasion; no perineural invasion; treatment effect present with single cells or rare  small groups  of cancer cells (near complete response, score 1); all margins negative for invasive carcinoma; 2 of 13 lymph nodes positive; pT1, pN1b Anemia Chronic renal failure Congestive heart failure-severe LVH Diabetes Gout Peripheral neuropathy? Hypertension Fever/chills 01/03/2021-CT with air-fluid collection in the presacral space extending from the level of the anus to the level of S2 worrisome for abscess or contained perforation.  Transferred to [facility].  Drain placed.  Culture positive for E. coli.  Drain removed 01/07/2021.  Repeat CT 01/08/2021 showed near resolution of the fluid component of presacral collection with persistent thick soft tissue rind not amenable to drainage.  Drain was not replaced.    Disposition: Craig West appears improved.  The abdominal wound is healing.  He has follow-up at [facility] in December to discuss ostomy reversal.  He has persistent numbness/tingling in the fingertips and toes with intermittent pain.  This could be related to oxaliplatin or diabetes.  We discussed a trial of Neurontin, dose based on his kidney function.  Review of his medication list indicates he is already taking Neurontin.  He is not sure if this is accurate.  He will check his medications at home and let us know.  We discussed the potential for sedation with Neurontin.  He will return for follow-up in 1 month.  We are available to see him sooner if needed.    [name] ANP/GNP-BC   02/02/2021  8:45 AM

## 2021-02-12 ENCOUNTER — Other Ambulatory Visit: Payer: Self-pay

## 2021-02-12 ENCOUNTER — Other Ambulatory Visit: Payer: Self-pay | Admitting: Family Medicine

## 2021-02-12 DIAGNOSIS — I110 Hypertensive heart disease with heart failure: Secondary | ICD-10-CM

## 2021-02-12 MED FILL — Gabapentin Cap 300 MG: ORAL | 30 days supply | Qty: 60 | Fill #1 | Status: AC

## 2021-02-12 NOTE — Telephone Encounter (Signed)
Requested medications are due for refill today.  yes  Requested medications are on the active medications list.  yes  Last refill. 05/25/2020  Future visit scheduled.   no  Notes to clinic.  Refill failed protocol dt abnormal labs

## 2021-02-14 ENCOUNTER — Other Ambulatory Visit: Payer: Self-pay

## 2021-02-14 MED ORDER — FUROSEMIDE 20 MG PO TABS
ORAL_TABLET | ORAL | 2 refills | Status: DC
  Filled 2021-02-14: qty 180, 30d supply, fill #0
  Filled 2021-05-07: qty 180, 90d supply, fill #0

## 2021-02-19 ENCOUNTER — Other Ambulatory Visit: Payer: Self-pay

## 2021-02-19 ENCOUNTER — Encounter (HOSPITAL_COMMUNITY): Payer: Self-pay

## 2021-02-19 ENCOUNTER — Emergency Department (HOSPITAL_COMMUNITY): Payer: Medicaid Other

## 2021-02-19 ENCOUNTER — Emergency Department (HOSPITAL_COMMUNITY)
Admission: EM | Admit: 2021-02-19 | Discharge: 2021-02-19 | Disposition: A | Discharge status: Home / Self Care | Payer: Medicaid Other | Attending: Emergency Medicine | Admitting: Emergency Medicine

## 2021-02-19 DIAGNOSIS — Z79899 Other long term (current) drug therapy: Secondary | ICD-10-CM | POA: Diagnosis not present

## 2021-02-19 DIAGNOSIS — Z85048 Personal history of other malignant neoplasm of rectum, rectosigmoid junction, and anus: Secondary | ICD-10-CM | POA: Insufficient documentation

## 2021-02-19 DIAGNOSIS — Z87891 Personal history of nicotine dependence: Secondary | ICD-10-CM | POA: Insufficient documentation

## 2021-02-19 DIAGNOSIS — I5032 Chronic diastolic (congestive) heart failure: Secondary | ICD-10-CM | POA: Insufficient documentation

## 2021-02-19 DIAGNOSIS — E1122 Type 2 diabetes mellitus with diabetic chronic kidney disease: Secondary | ICD-10-CM | POA: Insufficient documentation

## 2021-02-19 DIAGNOSIS — Y92410 Unspecified street and highway as the place of occurrence of the external cause: Secondary | ICD-10-CM | POA: Insufficient documentation

## 2021-02-19 DIAGNOSIS — I130 Hypertensive heart and chronic kidney disease with heart failure and stage 1 through stage 4 chronic kidney disease, or unspecified chronic kidney disease: Secondary | ICD-10-CM | POA: Diagnosis not present

## 2021-02-19 DIAGNOSIS — M25512 Pain in left shoulder: Secondary | ICD-10-CM | POA: Insufficient documentation

## 2021-02-19 DIAGNOSIS — Z8616 Personal history of COVID-19: Secondary | ICD-10-CM | POA: Diagnosis not present

## 2021-02-19 DIAGNOSIS — J45909 Unspecified asthma, uncomplicated: Secondary | ICD-10-CM | POA: Diagnosis not present

## 2021-02-19 DIAGNOSIS — M25519 Pain in unspecified shoulder: Secondary | ICD-10-CM | POA: Diagnosis not present

## 2021-02-19 DIAGNOSIS — S43102A Unspecified dislocation of left acromioclavicular joint, initial encounter: Secondary | ICD-10-CM

## 2021-02-19 DIAGNOSIS — N184 Chronic kidney disease, stage 4 (severe): Secondary | ICD-10-CM | POA: Diagnosis not present

## 2021-02-19 DIAGNOSIS — I10 Essential (primary) hypertension: Secondary | ICD-10-CM | POA: Diagnosis not present

## 2021-02-19 MED ORDER — OXYCODONE HCL 5 MG PO TABS
10.0000 mg | ORAL_TABLET | Freq: Once | ORAL | Status: AC
  Administered 2021-02-19: 10 mg via ORAL
  Filled 2021-02-19: qty 2

## 2021-02-19 MED ORDER — OXYCODONE HCL 5 MG PO TABS: 5.0000 mg | ORAL_TABLET | Freq: Four times a day (QID) | ORAL | 0 refills | Status: AC | PRN

## 2021-02-19 NOTE — Discharge Instructions (Addendum)
You do not have a fracture of your left shoulder but you do have ligament injury with minimal shoulder separation of your left shoulder.  We have provided you with a sling.  I have also sent in pain medication to the pharmacy for you.  I have attached orthopedic follow-up information above for you.  Please call them tomorrow morning to schedule a follow-up appointment.
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
## 2021-02-21 ENCOUNTER — Other Ambulatory Visit: Payer: Self-pay

## 2021-02-28 ENCOUNTER — Other Ambulatory Visit: Payer: Self-pay

## 2021-02-28 ENCOUNTER — Encounter (HOSPITAL_BASED_OUTPATIENT_CLINIC_OR_DEPARTMENT_OTHER): Payer: Self-pay | Admitting: Cardiovascular Disease

## 2021-02-28 ENCOUNTER — Ambulatory Visit (INDEPENDENT_AMBULATORY_CARE_PROVIDER_SITE_OTHER): Payer: Medicaid Other | Admitting: Cardiovascular Disease

## 2021-02-28 VITALS — BP 128/78 | HR 84 | Ht 73.0 in | Wt 232.0 lb

## 2021-02-28 DIAGNOSIS — E7800 Pure hypercholesterolemia, unspecified: Secondary | ICD-10-CM

## 2021-02-28 DIAGNOSIS — N184 Chronic kidney disease, stage 4 (severe): Secondary | ICD-10-CM | POA: Diagnosis not present

## 2021-02-28 DIAGNOSIS — I5032 Chronic diastolic (congestive) heart failure: Secondary | ICD-10-CM | POA: Diagnosis not present

## 2021-02-28 DIAGNOSIS — I10 Essential (primary) hypertension: Secondary | ICD-10-CM
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
## 2021-02-28 NOTE — Assessment & Plan Note (Signed)
Blood pressure is very well-controlled on his current regimen of amlodipine, carvedilol, Imdur, and hydralazine.  Recommended that he work on increasing his exercise to at least 150 minutes weekly.  He requested a referral to the [facility].

## 2021-02-28 NOTE — Assessment & Plan Note (Signed)
Renal function stable.  He follows with nephrology.

## 2021-02-28 NOTE — Assessment & Plan Note (Signed)
Check fasting lipids and a CMP today.

## 2021-02-28 NOTE — Patient Instructions (Addendum)
Medication Instructions:  Your physician recommends that you continue on your current medications as directed. Please refer to the Current Medication list given to you today.   *If you need a refill on your cardiac medications before your next appointment, please call your pharmacy*  Lab Work: LP/CMET TODAY  If you have labs (blood work) drawn today and your tests are completely normal, you will receive your results only by: [place] (if you have MyChart) OR A paper copy in the mail If you have any lab test that is abnormal or we need to change your treatment, we will call you to review the results.  Testing/Procedures: NONE  Follow-Up: At [facility], you and your health needs are our priority.  As part of our continuing mission to provide you with exceptional heart care, we have created designated Provider Care Teams.  These Care Teams include your primary Cardiologist (physician) and Advanced Practice Providers (APPs -  Physician Assistants and Nurse Practitioners) who all work together to provide you with the care you need, when you need it.  We recommend signing up for the patient portal called "MyChart".  Sign up information is provided on this After Visit Summary.  MyChart is used to connect with patients for Virtual Visits (Telemedicine).  Patients are able to view lab/test results, encounter notes, upcoming appointments, etc.  Non-urgent messages can be sent to your provider as well.   To learn more about what you can do with MyChart, go to [email or web address].    Your next appointment:   12 month(s)  The format for your next appointment:   In Person  Provider:   Skeet Latch, MD   You have been referred to [place] UP IF YOU DO NOT [place] 2 WEEKS

## 2021-02-28 NOTE — Assessment & Plan Note (Signed)
He is euvolemic and doing well.  Continue hypertension management as above as well as Lasix.  Renal function stable.

## 2021-03-01 ENCOUNTER — Encounter: Payer: Self-pay | Admitting: Oncology

## 2021-03-01 ENCOUNTER — Emergency Department (HOSPITAL_COMMUNITY): Payer: Medicaid Other

## 2021-03-01 ENCOUNTER — Emergency Department (HOSPITAL_COMMUNITY)
Admission: EM | Admit: 2021-03-01 | Discharge: 2021-03-01 | Disposition: A | Discharge status: Home / Self Care | Payer: Medicaid Other | Attending: Emergency Medicine | Admitting: Emergency Medicine

## 2021-03-01 ENCOUNTER — Encounter (HOSPITAL_COMMUNITY): Payer: Self-pay | Admitting: Emergency Medicine

## 2021-03-01 DIAGNOSIS — J45909 Unspecified asthma, uncomplicated: Secondary | ICD-10-CM | POA: Diagnosis not present

## 2021-03-01 DIAGNOSIS — I5032 Chronic diastolic (congestive) heart failure: Secondary | ICD-10-CM | POA: Insufficient documentation

## 2021-03-01 DIAGNOSIS — Z20822 Contact with and (suspected) exposure to covid-19: Secondary | ICD-10-CM | POA: Diagnosis not present

## 2021-03-01 DIAGNOSIS — J3489 Other specified disorders of nose and nasal sinuses: Secondary | ICD-10-CM | POA: Diagnosis not present

## 2021-03-01 DIAGNOSIS — Z8616 Personal history of COVID-19: Secondary | ICD-10-CM | POA: Insufficient documentation

## 2021-03-01 DIAGNOSIS — N184 Chronic kidney disease, stage 4 (severe): Secondary | ICD-10-CM | POA: Diagnosis not present

## 2021-03-01 DIAGNOSIS — J111 Influenza due to unidentified influenza virus with other respiratory manifestations: Secondary | ICD-10-CM

## 2021-03-01 DIAGNOSIS — Z79899 Other long term (current) drug therapy: Secondary | ICD-10-CM | POA: Diagnosis not present

## 2021-03-01 DIAGNOSIS — E1122 Type 2 diabetes mellitus with diabetic chronic kidney disease: Secondary | ICD-10-CM | POA: Insufficient documentation

## 2021-03-01 DIAGNOSIS — J101 Influenza due to other identified influenza virus with other respiratory manifestations: Secondary | ICD-10-CM | POA: Insufficient documentation

## 2021-03-01 DIAGNOSIS — Z87891 Personal history of nicotine dependence: Secondary | ICD-10-CM | POA: Diagnosis not present

## 2021-03-01 DIAGNOSIS — I130 Hypertensive heart and chronic kidney disease with heart failure and stage 1 through stage 4 chronic kidney disease, or unspecified chronic kidney disease: Secondary | ICD-10-CM | POA: Diagnosis not present

## 2021-03-01 DIAGNOSIS — R42 Dizziness and giddiness: Secondary | ICD-10-CM

## 2021-03-01 LAB — CBC WITH DIFFERENTIAL/PLATELET
Abs Immature Granulocytes: 0.05 10*3/uL (ref 0.00–0.07)
Basophils Absolute: 0 10*3/uL (ref 0.0–0.1)
Basophils Relative: 0 %
Eosinophils Absolute: 0.4 10*3/uL (ref 0.0–0.5)
Eosinophils Relative: 5 %
HCT: 36.9 % — ABNORMAL LOW (ref 39.0–52.0)
Hemoglobin: 10.9 g/dL — ABNORMAL LOW (ref 13.0–17.0)
Immature Granulocytes: 1 %
Lymphocytes Relative: 5 %
Lymphs Abs: 0.4 10*3/uL — ABNORMAL LOW (ref 0.7–4.0)
MCH: 26.4 pg (ref 26.0–34.0)
MCHC: 29.5 g/dL — ABNORMAL LOW (ref 30.0–36.0)
MCV: 89.3 fL (ref 80.0–100.0)
Monocytes Absolute: 1.2 10*3/uL — ABNORMAL HIGH (ref 0.1–1.0)
Monocytes Relative: 15 %
Neutro Abs: 6.1 10*3/uL (ref 1.7–7.7)
Neutrophils Relative %: 74 %
Platelets: 173 10*3/uL (ref 150–400)
RBC: 4.13 MIL/uL — ABNORMAL LOW (ref 4.22–5.81)
RDW: 17.5 % — ABNORMAL HIGH (ref 11.5–15.5)
WBC: 8.2 10*3/uL (ref 4.0–10.5)
nRBC: 0 % (ref 0.0–0.2)

## 2021-03-01 LAB — COMPREHENSIVE METABOLIC PANEL
ALT: 18 IU/L (ref 0–44)
ALT: 20 U/L (ref 0–44)
AST: 18 IU/L (ref 0–40)
AST: 24 U/L (ref 15–41)
Albumin/Globulin Ratio: 1.4 (ref 1.2–2.2)
Albumin: 4.2 g/dL (ref 3.8–4.9)
Albumin: 3.5 g/dL (ref 3.5–5.0)
Alkaline Phosphatase: 158 IU/L — ABNORMAL HIGH (ref 44–121)
Alkaline Phosphatase: 119 U/L (ref 38–126)
Anion gap: 6 (ref 5–15)
BUN/Creatinine Ratio: 19 (ref 9–20)
BUN: 48 mg/dL — ABNORMAL HIGH (ref 6–24)
BUN: 46 mg/dL — ABNORMAL HIGH (ref 6–20)
Bilirubin Total: 0.4 mg/dL (ref 0.0–1.2)
CO2: 19 mmol/L — ABNORMAL LOW (ref 20–29)
CO2: 23 mmol/L (ref 22–32)
Calcium: 9.1 mg/dL (ref 8.7–10.2)
Calcium: 8.5 mg/dL — ABNORMAL LOW (ref 8.9–10.3)
Chloride: 104 mmol/L (ref 96–106)
Chloride: 109 mmol/L (ref 98–111)
Creatinine, Ser: 2.48 mg/dL — ABNORMAL HIGH (ref 0.76–1.27)
Creatinine, Ser: 2.98 mg/dL — ABNORMAL HIGH (ref 0.61–1.24)
GFR, Estimated: 24 mL/min — ABNORMAL LOW (ref >60)
Globulin, Total: 2.9 g/dL (ref 1.5–4.5)
Glucose, Bld: 95 mg/dL (ref 70–99)
Glucose: 96 mg/dL (ref 70–99)
Potassium: 5 mmol/L (ref 3.5–5.2)
Potassium: 5.1 mmol/L (ref 3.5–5.1)
Sodium: 140 mmol/L (ref 134–144)
Sodium: 138 mmol/L (ref 135–145)
Total Bilirubin: 0.8 mg/dL (ref 0.3–1.2)
Total Protein: 7.1 g/dL (ref 6.0–8.5)
Total Protein: 7.3 g/dL (ref 6.5–8.1)
eGFR: 30 mL/min/{1.73_m2} — ABNORMAL LOW (ref >59)

## 2021-03-01 LAB — LIPID PANEL
Chol/HDL Ratio: 7.5 ratio — ABNORMAL HIGH (ref 0.0–5.0)
Cholesterol, Total: 248 mg/dL — ABNORMAL HIGH (ref 100–199)
HDL: 33 mg/dL — ABNORMAL LOW (ref >39)
LDL Chol Calc (NIH): 153 mg/dL — ABNORMAL HIGH (ref 0–99)
Triglycerides: 332 mg/dL — ABNORMAL HIGH (ref 0–149)
VLDL Cholesterol Cal: 62 mg/dL — ABNORMAL HIGH (ref 5–40)

## 2021-03-01 LAB — RESP PANEL BY RT-PCR (FLU A&B, COVID) ARPGX2
Influenza A by PCR: POSITIVE — AB
Influenza B by PCR: NEGATIVE
SARS Coronavirus 2 by RT PCR: NEGATIVE

## 2021-03-01 MED ORDER — OSELTAMIVIR PHOSPHATE 75 MG PO CAPS: 75.0000 mg | ORAL_CAPSULE | Freq: Two times a day (BID) | ORAL | 0 refills | Status: DC

## 2021-03-01 MED ORDER — ONDANSETRON HCL 4 MG PO TABS: 4.0000 mg | ORAL_TABLET | Freq: Four times a day (QID) | ORAL | 0 refills | Status: DC

## 2021-03-01 NOTE — ED Triage Notes (Signed)
Patient complains of sudden onset of dizziness around 0900 this morning. Patient also reports nasal congestion for which he has taken mucinex at 0815. Patient also reports going to chiropractor at 0900 this morning. Patient alert, oriented, and in no apparent distress. Speech is clear, no arm drift, no facial droop. Denies vision changes.

## 2021-03-01 NOTE — ED Provider Notes (Signed)
[place] EMERGENCY DEPARTMENT Provider Note   CSN: [id number] Arrival date & time: 03/01/21  1044     History Chief Complaint  Patient presents with   Dizziness    Rudell Ortman is a 55 y.o. male.  The history is provided by the patient and medical records.  Dizziness Quality:  Lightheadedness Severity:  Moderate Onset quality:  Gradual Duration:  1 day Timing:  Constant Progression:  Resolved Chronicity:  New Context: standing up   Relieved by:  Being still Worsened by:  Nothing Associated symptoms: no chest pain, no palpitations, no shortness of breath, no tinnitus and no vomiting   Cough Cough characteristics:  Productive and non-productive Sputum characteristics:  Unable to specify Severity:  Moderate Onset quality:  Gradual Duration:  3 days Timing:  Constant Progression:  Unchanged Context: upper respiratory infection   Relieved by:  Nothing Associated symptoms: chills, fever and rhinorrhea   Associated symptoms: no chest pain, no ear pain, no rash, no shortness of breath and no sore throat       Past Medical History:  Diagnosis Date   Asthma    CHF (congestive heart failure) (HCC)    CKD (chronic kidney disease), stage IV ([place])    COVID-19 virus infection 04/2019   Diabetes mellitus without complication (HCC)    Fatigue 11/23/2020   Gout    Hypertension    Leg heaviness 11/23/2020   NSVT (nonsustained ventricular tachycardia) 11/23/2020   Rectal cancer [facility])     Patient Active Problem List   Diagnosis Date Noted   Fatigue 11/23/2020   Leg heaviness 11/23/2020   NSVT (nonsustained ventricular tachycardia) 11/23/2020   Diabetes mellitus without complication ([place])    CKD (chronic kidney disease), stage IV (HCC)    PICC (peripherally inserted central catheter) in place 03/27/2020   Rectal cancer ([place]) 01/27/2020   Benign neoplasm of ascending colon    Benign neoplasm of descending colon    Benign neoplasm of sigmoid colon     Rectal mass    Rectal bleeding    Iron deficiency anemia    Chronic diastolic heart failure (HCC)    Hyperkalemia    Volume overload 01/06/2020   Demand ischemia ([place])    COVID-19 virus infection 04/2019   Obesity (BMI 30.0-34.9) 12/09/2017   Non compliance w medication regimen 04/18/2017   Gout 08/02/2016   Diabetic neuropathy ([place]) 08/02/2016   Hyperlipidemia 10/24/2015   Asthma 10/16/2015   Type 2 diabetes mellitus without complication ([place]) 17/49/4496   Essential hypertension 06/13/2014   Smoker 06/13/2014    Past Surgical History:  Procedure Laterality Date   BIOPSY  01/11/2020   Procedure: BIOPSY;  Surgeon: Irene Shipper, MD;  Location: [place];  Service: Endoscopy;;   COLONOSCOPY WITH PROPOFOL N/A 01/11/2020   Procedure: COLONOSCOPY WITH PROPOFOL;  Surgeon: Irene Shipper, MD;  Location: [facility] ENDOSCOPY;  Service: Endoscopy;  Laterality: N/A;   NO PAST SURGERIES     POLYPECTOMY  01/11/2020   Procedure: POLYPECTOMY;  Surgeon: Irene Shipper, MD;  Location: [place];  Service: Endoscopy;;   SUBMUCOSAL TATTOO INJECTION  01/11/2020   Procedure: SUBMUCOSAL TATTOO INJECTION;  Surgeon: Irene Shipper, MD;  Location: [facility] ENDOSCOPY;  Service: Endoscopy;;       Family History  Problem Relation Age of Onset   Heart failure Brother     Social History   Tobacco Use   Smoking status: Former    Packs/day: 0.50    Types: Cigarettes  Quit date: 10/27/2019    Years since quitting: 1.3   Smokeless tobacco: Never  Vaping Use   Vaping Use: Never used  Substance Use Topics   Alcohol use: Yes    Alcohol/week: 0.0 standard drinks    Comment: occ   Drug use: No    Home Medications Prior to Admission medications   Medication Sig Start Date End Date Taking? Authorizing Provider  ondansetron (ZOFRAN) 4 MG tablet Take 1 tablet (4 mg total) by mouth every 6 (six) hours. 03/01/21  Yes Kasheem Toner, Burnadette Peter, MD  oseltamivir (TAMIFLU) 75 MG capsule Take 1 capsule (75 mg total) by  mouth every 12 (twelve) hours. 03/01/21  Yes Onelia Cadmus, Burnadette Peter, MD  albuterol (VENTOLIN HFA) 108 (90 Base) MCG/ACT inhaler INHALE 2 PUFFS INTO THE LUNGS EVERY SIX HOURS AS NEEDED FOR WHEEZING OR SHORTNESS OF BREATH. 12/27/19 01/03/21  Andrew Au, MD  allopurinol (ZYLOPRIM) 100 MG tablet Take 200 mg by mouth daily.    [provider]  amLODipine (NORVASC) 10 MG tablet Take 1 tablet (10 mg total) by mouth daily. 11/30/20   Skeet Latch, MD  atorvastatin (LIPITOR) 40 MG tablet TAKE 1 TABLET (40 MG TOTAL) BY MOUTH DAILY. FOR HYPERCHOLESTEROLEMIA Patient taking differently: Take 40 mg by mouth daily. 05/25/20 05/25/21  Charlott Rakes, MD  Blood Glucose Monitoring Suppl (TRUE METRIX METER) w/Device KIT Check blood sugar fasting and ant bedtime and record 05/15/17   Charlott Rakes, MD  carvedilol (COREG) 25 MG tablet Take 1 tablet (25 mg total) by mouth 2 (two) times daily with a meal. 11/30/20   Skeet Latch, MD  cephALEXin (KEFLEX) 500 MG capsule Take 1 capsule (500 mg total) by mouth 2 (two) times daily. 01/02/21   Charlott Rakes, MD  Colchicine (MITIGARE) 0.6 MG CAPS Take 1 capsule by mouth as needed. Take 1 tablet (0.6 mg) by mouth at the onset of a gout attack with next dose no sooner than 3 days later 11/30/20   Skeet Latch, MD  furosemide (LASIX) 20 MG tablet TAKE 3 TABLETS (60 MG TOTAL) BY MOUTH 2 (TWO) TIMES DAILY. 02/14/21 02/14/22  Charlott Rakes, MD  gabapentin (NEURONTIN) 300 MG capsule TAKE 1 CAPSULE (300 MG TOTAL) BY MOUTH 2 (TWO) TIMES DAILY. Patient taking differently: Take 300 mg by mouth 2 (two) times daily. 04/05/20 04/05/21  Argentina Donovan, PA-C  glucose blood (TRUE METRIX BLOOD GLUCOSE TEST) test strip Use as instructed 12/27/19   Andrew Au, MD  hydrALAZINE (APRESOLINE) 100 MG tablet Take 1 tablet (100 mg total) by mouth 3 (three) times daily. 11/30/20   Skeet Latch, MD  isosorbide mononitrate (IMDUR) 60 MG 24 hr tablet TAKE 1 TABLET (60 MG TOTAL) BY MOUTH  DAILY. Patient taking differently: Take 60 mg by mouth daily. 05/25/20 05/25/21  Charlott Rakes, MD  Misc. Devices (DIGITAL GLASS SCALE) MISC Use scale to weight yourself. Increasing weight may indicate fluid overload 12/27/19   Andrew Au, MD  tamsulosin (FLOMAX) 0.4 MG CAPS capsule TAKE 1 CAPSULE (0.4 MG TOTAL) BY MOUTH DAILY. Patient taking differently: Take 0.4 mg by mouth daily. 05/25/20 05/25/21  Charlott Rakes, MD  TRUEplus Lancets 28G MISC Check blood sugar fasting and at bedtime 12/27/19   Andrew Au, MD    Allergies    Patient has no known allergies.  Review of Systems   Review of Systems  Constitutional:  Positive for chills and fever.  HENT:  Positive for congestion and rhinorrhea. Negative for ear pain, sore throat and tinnitus.  Eyes:  Negative for pain and visual disturbance.  Respiratory:  Positive for cough. Negative for shortness of breath.   Cardiovascular:  Negative for chest pain and palpitations.  Gastrointestinal:  Negative for abdominal pain and vomiting.  Genitourinary:  Negative for dysuria and hematuria.  Musculoskeletal:  Negative for arthralgias and back pain.  Skin:  Negative for color change and rash.  Neurological:  Positive for dizziness. Negative for seizures and syncope.  All other systems reviewed and are negative.  Physical Exam Updated Vital Signs BP (!) 175/99 (BP Location: Left Arm)   Pulse 84   Temp 98 F (36.7 C)   Resp 18   SpO2 96%   Physical Exam Vitals and nursing note reviewed.  Constitutional:      General: He is not in acute distress.    Appearance: Normal appearance. He is well-developed. He is obese. He is not toxic-appearing.  HENT:     Head: Normocephalic and atraumatic.     Right Ear: External ear normal.     Left Ear: External ear normal.     Nose: Congestion and rhinorrhea present.     Mouth/Throat:     Mouth: Mucous membranes are moist.     Pharynx: Oropharynx is clear. No posterior oropharyngeal erythema.   Eyes:     Extraocular Movements: Extraocular movements intact.     Conjunctiva/sclera: Conjunctivae normal.     Pupils: Pupils are equal, round, and reactive to light.  Cardiovascular:     Rate and Rhythm: Normal rate and regular rhythm.     Pulses: Normal pulses.     Heart sounds: No murmur heard. Pulmonary:     Effort: Pulmonary effort is normal. No respiratory distress.     Breath sounds: Normal breath sounds. No wheezing, rhonchi or rales.  Abdominal:     General: Abdomen is flat. Bowel sounds are normal.     Palpations: Abdomen is soft.     Tenderness: There is no abdominal tenderness. There is no guarding or rebound.  Musculoskeletal:        General: No swelling, tenderness or deformity. Normal range of motion.     Cervical back: Normal range of motion and neck supple. No rigidity.  Skin:    General: Skin is warm and dry.     Capillary Refill: Capillary refill takes less than 2 seconds.     Findings: No rash.  Neurological:     General: No focal deficit present.     Mental Status: He is alert and oriented to person, place, and time.     Cranial Nerves: No cranial nerve deficit.     Sensory: No sensory deficit.     Motor: Weakness (Symmetrical, generalized) present.     Coordination: Coordination normal.     Gait: Gait normal.     Comments: Dix-Hallpike negative bilaterally.  No nystagmus on exam.  Psychiatric:        Mood and Affect: Mood normal.    ED Results / Procedures / Treatments   Labs (all labs ordered are listed, but only abnormal results are displayed) Labs Reviewed  RESP PANEL BY RT-PCR (FLU A&B, COVID) ARPGX2 - Abnormal; Notable for the following components:      Result Value   Influenza A by PCR POSITIVE (*)    All other components within normal limits  CBC WITH DIFFERENTIAL/PLATELET - Abnormal; Notable for the following components:   RBC 4.13 (*)    Hemoglobin 10.9 (*)    HCT 36.9 (*)  MCHC 29.5 (*)    RDW 17.5 (*)    Lymphs Abs 0.4 (*)     Monocytes Absolute 1.2 (*)    All other components within normal limits  COMPREHENSIVE METABOLIC PANEL - Abnormal; Notable for the following components:   BUN 46 (*)    Creatinine, Ser 2.98 (*)    Calcium 8.5 (*)    GFR, Estimated 24 (*)    All other components within normal limits    EKG EKG Interpretation  Date/Time:  Thursday March 01 2021 11:11:15 EST Ventricular Rate:  62 PR Interval:  144 QRS Duration: 130 QT Interval:  432 QTC Calculation: 438 R Axis:   -82 Text Interpretation: Normal sinus rhythm Left axis deviation Right bundle branch block T wave abnormality, consider lateral ischemia Abnormal ECG No significant change since last tracing Confirmed by Lacretia Leigh ([id number]) on 03/01/2021 10:32:42 PM  Radiology DG Chest 1 View  Result Date: 03/01/2021 CLINICAL DATA:  Dizziness. EXAM: CHEST  1 VIEW COMPARISON:  Chest x-ray 08/13/2020. FINDINGS: The heart is borderline enlarged, unchanged. The lungs and costophrenic angles are clear. No pneumothorax or acute fracture. IMPRESSION: No active disease. Electronically Signed   By: Ronney Asters M.D.   On: 03/01/2021 21:11    Procedures Procedures   Medications Ordered in ED Medications - No data to display  ED Course  I have reviewed the triage vital signs and the nursing notes.  Pertinent labs & imaging results that were available during my care of the patient were reviewed by me and considered in my medical decision making (see chart for details).    MDM Rules/Calculators/A&P                          55 y/o male with a history of CKD, CHF, T2DM who presents w/ pre-syncopal dizziness. Recent viral URI sxs resulting in poor p.o.  EKG sinus rhythm w/ LAD, RBBB, nonspecific T wave changes. Unchanged from prior. CXR showed no acute cardiopulmonary process.  Low concern for pneumonia.  CBC revealed stable hgb 10.9. Cr at baseline.  Electrolytes reassuring.  Influenza test positive.  Dizziness likely secondary to dehydration  in the setting of viral illness and poor p.o.  He has no focal deficits on exam.  Low concern for CVA.  He has no nystagmus on Dix-Hallpike testing.  Low concern for vertigo.  He has no significant lower extremity edema.  Lungs CTA B.  Low concern for CHF exacerbation.  Appropriate for discharge home with Tamiflu and Zofran.  Encouraged rest and rehydration.  Close follow-up with PCP discussed.  Strict return to ED precautions provided.   Final Clinical Impression(s) / ED Diagnoses Final diagnoses:  Dizziness  Influenza    Rx / DC Orders ED Discharge Orders          Ordered    ondansetron (ZOFRAN) 4 MG tablet  Every 6 hours        03/01/21 2203    oseltamivir (TAMIFLU) 75 MG capsule  Every 12 hours        03/01/21 2203             Idamae Lusher, MD 03/01/21 2239    Lacretia Leigh, MD 03/01/21 2253

## 2021-03-01 NOTE — ED Notes (Signed)
Rn reviewed discharge instructions with pt. Pt verbalized understanding and had no further questions. VSS upon discharge 

## 2021-03-01 NOTE — ED Provider Notes (Signed)
Emergency Medicine Provider Triage Evaluation Note  Craig West , a 55 y.o. male  was evaluated in triage.  Pt complains of dizziness. States that same occurred this morning when he was at the chiropractor. He states that the chiropractor did not perform any sort of intervention this morning because he was dizzy prior to seeing the chiropractor. States that the room is spinning. Also endorses URI symptoms over the past few days. No history of dizziness in the past. Endorses subjective fevers and chills, no chest pain, SOB,  or n/v/d.  Review of Systems  Positive:  Negative: See above  Physical Exam  BP 110/74 (BP Location: Left Arm)   Pulse 62   Temp 98.4 F (36.9 C)   Resp 16   SpO2 96%  Gen:   Awake, no distress   Resp:  Normal effort  MSK:   Moves extremities without difficulty  Other:  Overall neurologically intact  Medical Decision Making  Medically screening exam initiated at 11:35 AM.  Appropriate orders placed.  Craig West was informed that the remainder of the evaluation will be completed by another provider, this initial triage assessment does not replace that evaluation, and the importance of remaining in the ED until their evaluation is complete.    Bud Face, PA-C 03/01/21 1138    Lorelle Gibbs, DO 03/02/21 1015

## 2021-03-05 ENCOUNTER — Other Ambulatory Visit: Payer: Self-pay

## 2021-03-05 ENCOUNTER — Inpatient Hospital Stay: Payer: Medicaid Other | Attending: Oncology | Admitting: Oncology

## 2021-03-05 ENCOUNTER — Telehealth: Payer: Self-pay | Admitting: *Deleted

## 2021-03-05 ENCOUNTER — Other Ambulatory Visit: Payer: Medicaid Other

## 2021-03-05 ENCOUNTER — Encounter: Payer: Self-pay | Admitting: Oncology

## 2021-03-05 VITALS — BP 189/100 | HR 81 | Temp 98.1°F | Resp 19 | Ht 73.0 in | Wt 218.0 lb

## 2021-03-05 DIAGNOSIS — M109 Gout, unspecified: Secondary | ICD-10-CM | POA: Insufficient documentation

## 2021-03-05 DIAGNOSIS — C20 Malignant neoplasm of rectum: Secondary | ICD-10-CM | POA: Insufficient documentation

## 2021-03-05 DIAGNOSIS — D649 Anemia, unspecified: Secondary | ICD-10-CM | POA: Insufficient documentation

## 2021-03-05 DIAGNOSIS — I129 Hypertensive chronic kidney disease with stage 1 through stage 4 chronic kidney disease, or unspecified chronic kidney disease: Secondary | ICD-10-CM | POA: Diagnosis not present

## 2021-03-05 DIAGNOSIS — N189 Chronic kidney disease, unspecified: Secondary | ICD-10-CM | POA: Diagnosis not present

## 2021-03-05 DIAGNOSIS — J90 Pleural effusion, not elsewhere classified: Secondary | ICD-10-CM | POA: Diagnosis not present

## 2021-03-05 DIAGNOSIS — E1122 Type 2 diabetes mellitus with diabetic chronic kidney disease: Secondary | ICD-10-CM | POA: Insufficient documentation

## 2021-03-05 NOTE — Progress Notes (Signed)
[place] OFFICE PROGRESS NOTE   Diagnosis: Rectal cancer  INTERVAL HISTORY:   [name] returns as scheduled.  He empties the ileostomy frequently.  The stool is formed.  He limits his oral intake due to a fear of increasing the ostomy output.  He was seen in the emergency room on 03/01/2021 with dizziness.  He was diagnosed with influenza A.  He is completing a course of Tamiflu.  No fever.  He has not taken his blood pressure medication today. [name] reports drainage from the abdominal wound.  He is no longer able to pack the wound.  He is scheduled for follow-up at [facility] on 03/20/2021.  Objective:  Vital signs in last 24 hours:  Blood pressure (!) 189/100, pulse 81, temperature 98.1 F (36.7 C), temperature source Oral, resp. rate 19, height 6\' 1"  (1.854 m), weight 218 lb (98.9 kg), SpO2 100 %.    Lymphatics: No cervical, supraclavicular, axillary, or inguinal nodes Resp: Right greater than left end inspiratory/expiratory wheeze/rhonchi, no respiratory distress Cardio: Regular rate and rhythm GI: No hepatosplenomegaly, left abdomen colostomy with soft brown stool Vascular: No leg edema  Skin: 3 mm opening at the midline wound with a light brown discharge  Portacath/PICC-without erythema  Lab Results:  Lab Results  Component Value Date   WBC 8.2 03/01/2021   HGB 10.9 (L) 03/01/2021   HCT 36.9 (L) 03/01/2021   MCV 89.3 03/01/2021   PLT 173 03/01/2021   NEUTROABS 6.1 03/01/2021    CMP  Lab Results  Component Value Date   NA 138 03/01/2021   K 5.1 03/01/2021   CL 109 03/01/2021   CO2 23 03/01/2021   GLUCOSE 95 03/01/2021   BUN 46 (H) 03/01/2021   CREATININE 2.98 (H) 03/01/2021   CALCIUM 8.5 (L) 03/01/2021   PROT 7.3 03/01/2021   ALBUMIN 3.5 03/01/2021   AST 24 03/01/2021   ALT 20 03/01/2021   ALKPHOS 119 03/01/2021   BILITOT 0.8 03/01/2021   GFRNONAA 24 (L) 03/01/2021   GFRAA 15 (L) 01/03/2020    Lab Results  Component Value Date    CEA1 4.40 10/10/2020   CEA 4.63 10/10/2020    No results found for: INR, LABPROT  Imaging:  DG Chest 1 View  Result Date: 03/01/2021 CLINICAL DATA:  Dizziness. EXAM: CHEST  1 VIEW COMPARISON:  Chest x-ray 08/13/2020. FINDINGS: The heart is borderline enlarged, unchanged. The lungs and costophrenic angles are clear. No pneumothorax or acute fracture. IMPRESSION: No active disease. Electronically Signed   By: Ronney Asters M.D.   On: 03/01/2021 21:11    Medications: I have reviewed the patient's current medications.   Assessment/Plan: Rectal cancer-rectal mass at 5-6 cm from the anal verge on colonoscopy 01/11/2020, biopsy confirmed adenocarcinoma CTs 01/11/2020-rectal thickening, small perirectal lymph nodes and external iliac nodes, moderate right pleural effusion MRI pelvis 01/27/2020-[id number] tumor at 7.1 cm from the internal anal sphincter, multiple mesorectal nodes, indistinct left pelvic sidewall node and enlarged bilateral external iliac nodes PET scan 02/11/2020-hypermetabolic rectosigmoid mass, hypermetabolic mesorectal and sigmoid mesocolon lymph nodes, left external iliac/obturator node with mild hypermetabolism, 18 mm left inguinal node with normal architecture and slight hypermetabolism Radiation and infusional 5-FU 02/21/2020-04/04/2020 CTs 05/09/2020-decreased perirectal and sigmoid mesocolon lymphadenopathy, no evidence of disease progression MRI pelvis 05/17/2020-no well-defined residual rectal mass seen.  Wall appears slightly irregular.  No perirectal extension of tumor.  Again identified is perirectal adenopathy, similar to most recent CT 05/10/2020 but improved compared to PET/CT of 02/11/2020. Cycle  1 FOLFOX 06/14/2020 06/27/2020 chemotherapy held due to hypertension Cycle 2 FOLFOX 06/29/2020 Cycle 3 FOLFOX 07/13/2020 Cycle 4 FOLFOX 07/27/2020 Cycle 5 FOLFOX 08/09/2020 Cycle 6 FOLFOX 08/24/2020 Cycle 7 FOLFOX 09/07/2020 Cycle 8 FOLFOX 09/21/2020 MRI scheduled 10/16/2020 12/06/2020  open low anterior resection with diverting loop colostomy, Dr. Hester Mates; pathology-rectal tumor location entirely below anterior peritoneal reflection; adenocarcinoma; minimal residual tumor; invades submucosa; no macroscopic tumor perforation; no lymphovascular invasion; no perineural invasion; treatment effect present with single cells or rare small groups of cancer cells (near complete response, score 1); all margins negative for invasive carcinoma; 2 of 13 lymph nodes positive; pT1, pN1b Anemia Chronic renal failure Congestive heart failure-severe LVH Diabetes Gout Peripheral neuropathy? Hypertension Fever/chills 01/03/2021-CT with air-fluid collection in the presacral space extending from the level of the anus to the level of S2 worrisome for abscess or contained perforation.  Transferred to [place].  Drain placed.  Culture positive for E. coli.  Drain removed 01/07/2021.  Repeat CT 01/08/2021 showed near resolution of the fluid component of presacral collection with persistent thick soft tissue rind not amenable to drainage.  Drain was not replaced. Influenza A 03/01/2021      Disposition: [name] is in clinical remission from rectal cancer.  He is completing a course of Tamiflu for influenza A.  He is not have flu symptoms today. He will follow-up with Dr.Mantyh for reversal of the colostomy.  We will request a follow-up for wound evaluation this week.  [name] will return for an office visit in 6 weeks.  Betsy Coder, MD  03/05/2021  8:36 AM

## 2021-03-05 NOTE — Addendum Note (Signed)
Addended by: Betsy Coder B on: 03/05/2021 08:53 AM   Modules accepted: Orders

## 2021-03-05 NOTE — Telephone Encounter (Signed)
Craig West has appointment on 12/20 with his surgeon at [facility], Dr. Hester Mates. Per Dr. Benay Spice, his midline incision has opened up and is draining and needs to be seen sooner. Dr. Hester Mates and his NP, Angelina Ok have nothing open till January. She will send message to Dr. Zollie Scale nurse to review with him for earlier appointment. They will call patient. Craig West made aware, and this RN will f/u as well.

## 2021-03-08 ENCOUNTER — Telehealth: Payer: Self-pay | Admitting: *Deleted

## 2021-03-08 NOTE — Telephone Encounter (Signed)
Called [facility] and left message with triage nurse line to inquire about status of getting earlier appointment w/Dr. Hester Mates re: incision opening and drainage. Provided direct # to call back.

## 2021-03-09 ENCOUNTER — Telehealth: Payer: Self-pay | Admitting: *Deleted

## 2021-03-09 NOTE — Telephone Encounter (Signed)
Message from [place] with Dr. Hester Mates that he can be seen on 03/13/21, but she is not able to reach him. There is a prep to the appointment as well, so she needs patient to call her at [phone]. Unable to reach patient, so called his mother and she agreed to text him to call this RN at direct number.

## 2021-03-09 NOTE — Telephone Encounter (Signed)
Called patient back and confirmed he spoke w/[facility] and is going to the appointment on 12/13.

## 2021-03-13 ENCOUNTER — Telehealth (HOSPITAL_BASED_OUTPATIENT_CLINIC_OR_DEPARTMENT_OTHER): Payer: Self-pay | Admitting: *Deleted

## 2021-03-13 ENCOUNTER — Other Ambulatory Visit: Payer: Self-pay

## 2021-03-13 DIAGNOSIS — Z5181 Encounter for therapeutic drug level monitoring: Secondary | ICD-10-CM

## 2021-03-13 DIAGNOSIS — E1122 Type 2 diabetes mellitus with diabetic chronic kidney disease: Secondary | ICD-10-CM

## 2021-03-13 DIAGNOSIS — I10 Essential (primary) hypertension: Secondary | ICD-10-CM

## 2021-03-13 DIAGNOSIS — E7800 Pure hypercholesterolemia, unspecified: Secondary | ICD-10-CM

## 2021-03-13 MED ORDER — ATORVASTATIN CALCIUM 80 MG PO TABS
80.0000 mg | ORAL_TABLET | Freq: Every day | ORAL | 3 refills | Status: DC
  Filled 2021-03-13: qty 90, 90d supply, fill #0

## 2021-03-13 NOTE — Telephone Encounter (Signed)
-----   Message from Skeet Latch, MD sent at 03/10/2021 10:51 AM EST ----- Cholesterol levels are very elevated.  This puts him at unnecessary risk of heart attacks and strokes.  Kidney function is improving but still abnormal. Liver function and electrolytes are normal.  Recommend increasing atorvastatin to 80mg  and repeating lipids//CMp in 3 months.

## 2021-03-13 NOTE — Telephone Encounter (Signed)
Advised patient of lab results, mailed lab orders, and send new Rx to pharmacy  °

## 2021-03-19 ENCOUNTER — Other Ambulatory Visit: Payer: Self-pay

## 2021-03-27 DIAGNOSIS — N400 Enlarged prostate without lower urinary tract symptoms: Secondary | ICD-10-CM | POA: Insufficient documentation

## 2021-04-05 ENCOUNTER — Other Ambulatory Visit (HOSPITAL_COMMUNITY): Payer: Self-pay

## 2021-04-05 ENCOUNTER — Other Ambulatory Visit (HOSPITAL_BASED_OUTPATIENT_CLINIC_OR_DEPARTMENT_OTHER): Payer: Self-pay

## 2021-04-17 ENCOUNTER — Inpatient Hospital Stay: Payer: Medicaid Other

## 2021-04-17 ENCOUNTER — Other Ambulatory Visit: Payer: Self-pay

## 2021-04-17 ENCOUNTER — Encounter: Payer: Self-pay | Admitting: Nurse Practitioner

## 2021-04-17 ENCOUNTER — Inpatient Hospital Stay: Payer: Medicaid Other | Attending: Oncology | Admitting: Nurse Practitioner

## 2021-04-17 VITALS — BP 135/91 | HR 78 | Temp 97.8°F | Resp 18 | Ht 73.0 in | Wt 232.1 lb

## 2021-04-17 DIAGNOSIS — E1122 Type 2 diabetes mellitus with diabetic chronic kidney disease: Secondary | ICD-10-CM | POA: Insufficient documentation

## 2021-04-17 DIAGNOSIS — C20 Malignant neoplasm of rectum: Secondary | ICD-10-CM | POA: Insufficient documentation

## 2021-04-17 DIAGNOSIS — I130 Hypertensive heart and chronic kidney disease with heart failure and stage 1 through stage 4 chronic kidney disease, or unspecified chronic kidney disease: Secondary | ICD-10-CM | POA: Insufficient documentation

## 2021-04-17 DIAGNOSIS — D649 Anemia, unspecified: Secondary | ICD-10-CM | POA: Diagnosis not present

## 2021-04-17 DIAGNOSIS — N189 Chronic kidney disease, unspecified: Secondary | ICD-10-CM | POA: Diagnosis not present

## 2021-04-17 LAB — CEA (ACCESS): CEA (CHCC): 3.41 ng/mL (ref 0.00–5.00)

## 2021-04-17 NOTE — Progress Notes (Signed)
[place] OFFICE PROGRESS NOTE   Diagnosis: Rectal cancer  INTERVAL HISTORY:   Mr. Tenbrink returns as scheduled.  He underwent transverse loop colostomy takedown 04/04/2021.  He was discharged home 04/06/2021.  He reports bowels are moving, somewhat erratic.  He has intermittent pain at the rectum.  He describes his appetite as "okay".  He is packing an abdominal wound.  Objective:  Vital signs in last 24 hours:  Blood pressure (!) 135/91, pulse 78, temperature 97.8 F (36.6 C), temperature source Oral, resp. rate 18, height 6\' 1"  (1.854 m), weight 232 lb 1.6 oz (105.3 kg), SpO2 98 %.    HEENT: No thrush or ulcers. Lymphatics: No palpable cervical, supraclavicular, axillary or inguinal lymph nodes. Resp: Lungs clear bilaterally. Cardio: Regular rate and rhythm. GI: No hepatomegaly.  Packed wound at the left abdomen, wound edges appear clean. Vascular: No leg edema.   Lab Results:  Lab Results  Component Value Date   WBC 8.2 03/01/2021   HGB 10.9 (L) 03/01/2021   HCT 36.9 (L) 03/01/2021   MCV 89.3 03/01/2021   PLT 173 03/01/2021   NEUTROABS 6.1 03/01/2021    Imaging:  No results found.  Medications: I have reviewed the patient's current medications.  Assessment/Plan: Rectal cancer-rectal mass at 5-6 cm from the anal verge on colonoscopy 01/11/2020, biopsy confirmed adenocarcinoma CTs 01/11/2020-rectal thickening, small perirectal lymph nodes and external iliac nodes, moderate right pleural effusion MRI pelvis 01/27/2020-T3bN2b tumor at 7.1 cm from the internal anal sphincter, multiple mesorectal nodes, indistinct left pelvic sidewall node and enlarged bilateral external iliac nodes PET scan 02/11/2020-hypermetabolic rectosigmoid mass, hypermetabolic mesorectal and sigmoid mesocolon lymph nodes, left external iliac/obturator node with mild hypermetabolism, 18 mm left inguinal node with normal architecture and slight hypermetabolism Radiation and infusional  5-FU 02/21/2020-04/04/2020 CTs 05/09/2020-decreased perirectal and sigmoid mesocolon lymphadenopathy, no evidence of disease progression MRI pelvis 05/17/2020-no well-defined residual rectal mass seen.  Wall appears slightly irregular.  No perirectal extension of tumor.  Again identified is perirectal adenopathy, similar to most recent CT 05/10/2020 but improved compared to PET/CT of 02/11/2020. Cycle 1 FOLFOX 06/14/2020 06/27/2020 chemotherapy held due to hypertension Cycle 2 FOLFOX 06/29/2020 Cycle 3 FOLFOX 07/13/2020 Cycle 4 FOLFOX 07/27/2020 Cycle 5 FOLFOX 08/09/2020 Cycle 6 FOLFOX 08/24/2020 Cycle 7 FOLFOX 09/07/2020 Cycle 8 FOLFOX 09/21/2020 MRI scheduled 10/16/2020 12/06/2020 open low anterior resection with diverting loop colostomy, Dr. Hester Mates; pathology-rectal tumor location entirely below anterior peritoneal reflection; adenocarcinoma; minimal residual tumor; invades submucosa; no macroscopic tumor perforation; no lymphovascular invasion; no perineural invasion; treatment effect present with single cells or rare small groups of cancer cells (near complete response, score 1); all margins negative for invasive carcinoma; 2 of 13 lymph nodes positive; pT1, pN1b 04/04/2021 transverse loop colostomy takedown Anemia Chronic renal failure Congestive heart failure-severe LVH Diabetes Gout Peripheral neuropathy? Hypertension Fever/chills 01/03/2021-CT with air-fluid collection in the presacral space extending from the level of the anus to the level of S2 worrisome for abscess or contained perforation.  Transferred to [place].  Drain placed.  Culture positive for E. coli.  Drain removed 01/07/2021.  Repeat CT 01/08/2021 showed near resolution of the fluid component of presacral collection with persistent thick soft tissue rind not amenable to drainage.  Drain was not replaced. Influenza A 03/01/2021    Disposition: Craig West appears stable.  He is recovering from the colostomy takedown procedure.  He has follow-up  with Dr. Hester Mates at [facility] later this month.  We reviewed surveillance imaging.  He had CT scans  of the abdomen and pelvis in October 2022.  Last chest CT February 2022.  We are referring him for a noncontrast chest CT in the next few weeks.  He will return for follow-up in 6 weeks.  He will contact the office in the interim with any problems.  [name] ANP/GNP-BC   04/17/2021  8:38 AM

## 2021-05-01 DIAGNOSIS — C20 Malignant neoplasm of rectum: Secondary | ICD-10-CM | POA: Diagnosis not present

## 2021-05-01 DIAGNOSIS — Z9889 Other specified postprocedural states: Secondary | ICD-10-CM | POA: Diagnosis not present

## 2021-05-04 ENCOUNTER — Other Ambulatory Visit: Payer: Self-pay

## 2021-05-04 ENCOUNTER — Ambulatory Visit (HOSPITAL_BASED_OUTPATIENT_CLINIC_OR_DEPARTMENT_OTHER)
Admission: RE | Admit: 2021-05-04 | Discharge: 2021-05-04 | Disposition: A | Discharge status: Home / Self Care | Payer: Medicaid Other | Source: Ambulatory Visit | Attending: Nurse Practitioner | Admitting: Nurse Practitioner

## 2021-05-04 DIAGNOSIS — C20 Malignant neoplasm of rectum: Secondary | ICD-10-CM | POA: Insufficient documentation

## 2021-05-04 DIAGNOSIS — J90 Pleural effusion, not elsewhere classified: Secondary | ICD-10-CM | POA: Diagnosis not present

## 2021-05-07 ENCOUNTER — Other Ambulatory Visit: Payer: Self-pay

## 2021-05-07 MED FILL — Tamsulosin HCl Cap 0.4 MG: ORAL | 30 days supply | Qty: 30 | Fill #0 | Status: AC

## 2021-05-07 MED FILL — Isosorbide Mononitrate Tab ER 24HR 60 MG: ORAL | 30 days supply | Qty: 30 | Fill #0 | Status: AC

## 2021-05-08 ENCOUNTER — Other Ambulatory Visit: Payer: Self-pay

## 2021-05-29 ENCOUNTER — Inpatient Hospital Stay: Payer: Medicaid Other | Attending: Oncology | Admitting: Oncology

## 2021-05-29 ENCOUNTER — Other Ambulatory Visit: Payer: Self-pay

## 2021-05-29 VITALS — BP 111/67 | HR 71 | Temp 98.2°F | Resp 20 | Ht 73.0 in | Wt 228.2 lb

## 2021-05-29 DIAGNOSIS — C20 Malignant neoplasm of rectum: Secondary | ICD-10-CM

## 2021-05-29 DIAGNOSIS — Z85048 Personal history of other malignant neoplasm of rectum, rectosigmoid junction, and anus: Secondary | ICD-10-CM | POA: Insufficient documentation

## 2021-05-29 DIAGNOSIS — R152 Fecal urgency: Secondary | ICD-10-CM | POA: Insufficient documentation

## 2021-05-29 NOTE — Progress Notes (Signed)
[place] OFFICE PROGRESS NOTE   Diagnosis: Rectal cancer  INTERVAL HISTORY:   Craig West returns as scheduled.  Good appetite.  He contains of frequent small-volume bowel movements.  He has "burning "at the perineum.  No bleeding.  He has not used Imodium or Metamucil.  Objective:  Vital signs in last 24 hours:  Blood pressure 111/67, pulse 71, temperature 98.2 F (36.8 C), temperature source Oral, resp. rate 20, height 6\' 1"  (1.854 m), weight 228 lb 3.2 oz (103.5 kg), SpO2 100 %.    Lymphatics: No cervical, supraclavicular, axillary, or inguinal nodes Resp: Lungs clear bilaterally  Cardio: Regular rate and rhythm GI: No hepatosplenomegaly, healed surgical incision Vascular: No leg edema Rectal: External exam reveals mild erythema/hyperpigmentation and skin thickening at the perineum, no discrete ulcers   Lab Results:  Lab Results  Component Value Date   WBC 8.2 03/01/2021   HGB 10.9 (L) 03/01/2021   HCT 36.9 (L) 03/01/2021   MCV 89.3 03/01/2021   PLT 173 03/01/2021   NEUTROABS 6.1 03/01/2021    CMP  Lab Results  Component Value Date   NA 138 03/01/2021   K 5.1 03/01/2021   CL 109 03/01/2021   CO2 23 03/01/2021   GLUCOSE 95 03/01/2021   BUN 46 (H) 03/01/2021   CREATININE 2.98 (H) 03/01/2021   CALCIUM 8.5 (L) 03/01/2021   PROT 7.3 03/01/2021   ALBUMIN 3.5 03/01/2021   AST 24 03/01/2021   ALT 20 03/01/2021   ALKPHOS 119 03/01/2021   BILITOT 0.8 03/01/2021   GFRNONAA 24 (L) 03/01/2021   GFRAA 15 (L) 01/03/2020    Lab Results  Component Value Date   CEA1 4.40 10/10/2020   CEA 3.41 04/17/2021    No results found for: INR, LABPROT  Imaging:  No results found.  Medications: I have reviewed the patient's current medications.   Assessment/Plan: Rectal cancer-rectal mass at 5-6 cm from the anal verge on colonoscopy 01/11/2020, biopsy confirmed adenocarcinoma CTs 01/11/2020-rectal thickening, small perirectal lymph nodes and external  iliac nodes, moderate right pleural effusion MRI pelvis 01/27/2020-T3bN2b tumor at 7.1 cm from the internal anal sphincter, multiple mesorectal nodes, indistinct left pelvic sidewall node and enlarged bilateral external iliac nodes PET scan 02/11/2020-hypermetabolic rectosigmoid mass, hypermetabolic mesorectal and sigmoid mesocolon lymph nodes, left external iliac/obturator node with mild hypermetabolism, 18 mm left inguinal node with normal architecture and slight hypermetabolism Radiation and infusional 5-FU 02/21/2020-04/04/2020 CTs 05/09/2020-decreased perirectal and sigmoid mesocolon lymphadenopathy, no evidence of disease progression MRI pelvis 05/17/2020-no well-defined residual rectal mass seen.  Wall appears slightly irregular.  No perirectal extension of tumor.  Again identified is perirectal adenopathy, similar to most recent CT 05/10/2020 but improved compared to PET/CT of 02/11/2020. Cycle 1 FOLFOX 06/14/2020 06/27/2020 chemotherapy held due to hypertension Cycle 2 FOLFOX 06/29/2020 Cycle 3 FOLFOX 07/13/2020 Cycle 4 FOLFOX 07/27/2020 Cycle 5 FOLFOX 08/09/2020 Cycle 6 FOLFOX 08/24/2020 Cycle 7 FOLFOX 09/07/2020 Cycle 8 FOLFOX 09/21/2020 MRI pelvis 10/16/2020-no residual rectal mass, small presacral/perirectal nodes improved from previous MRI 12/06/2020 open low anterior resection with diverting loop colostomy, Dr. Hester Mates; pathology-rectal tumor location entirely below anterior peritoneal reflection; adenocarcinoma; minimal residual tumor; invades submucosa; no macroscopic tumor perforation; no lymphovascular invasion; no perineural invasion; treatment effect present with single cells or rare small groups of cancer cells (near complete response, score 1); all margins negative for invasive carcinoma; 2 of 13 lymph nodes positive; pT1, pN1b 04/04/2021 transverse loop colostomy takedown CT chest 05/04/2021 (CT abdomen/pelvis October 2022)-no evidence of metastatic disease  Anemia Chronic renal failure Congestive  heart failure-severe LVH Diabetes Gout Peripheral neuropathy? Hypertension Fever/chills 01/03/2021-CT with air-fluid collection in the presacral space extending from the level of the anus to the level of S2 worrisome for abscess or contained perforation.  Transferred to [place].  Drain placed.  Culture positive for E. coli.  Drain removed 01/07/2021.  Repeat CT 01/08/2021 showed near resolution of the fluid component of presacral collection with persistent thick soft tissue rind not amenable to drainage.  Drain was not replaced. Influenza A 03/01/2021      Disposition: Craig West is in clinical remission from rectal cancer.  He is symptomatic with rectal urgency/frequency.  We will make referral to pelvic physical therapy.  We will ask our dietitian to contact him.  We will also print recommendations he was given by the surgical office last month.  He will return for an office visit and CEA in 4 months.  He will be due for restaging CTs in October.  Betsy Coder, MD  05/29/2021  8:27 AM

## 2021-06-06 ENCOUNTER — Telehealth: Payer: Self-pay | Admitting: Nutrition

## 2021-06-06 ENCOUNTER — Other Ambulatory Visit: Payer: Self-pay

## 2021-06-06 ENCOUNTER — Inpatient Hospital Stay: Payer: Medicaid Other | Attending: Oncology | Admitting: Nutrition

## 2021-06-06 NOTE — Telephone Encounter (Signed)
Note found in note section. Patient came into office for appointment. ?

## 2021-06-06 NOTE — Progress Notes (Signed)
Patient had a telephone visit scheduled but came in for appointment. ?56 year old male diagnosed with Rectal cancer. ?12/06/2020 open low anterior resection with diverting loop colostomy, Dr. Hester Mates; pathology-rectal tumor location entirely below anterior peritoneal reflection; adenocarcinoma; minimal residual tumor; invades submucosa; no macroscopic tumor perforation; no lymphovascular invasion; no perineural invasion; treatment effect present with single cells or rare small groups of cancer cells (near complete response, score 1); all margins negative for invasive carcinoma; 2 of 13 lymph nodes positive; pT1, pN1b ?04/04/2021 transverse loop colostomy takedown ? ?He is in clinical remission. ?He is followed by Dr. Benay Spice. ? ?PMH includes Anemia, CHF, CKD stage 4, DM, Fatigue, Gout, HTN. ? ?Medications noted. ? ?Labs were reviewed ? ?Height: 6'1" ?Weight: 225.2 pounds ?UBW: 225-230 pounds ?BMI:29.71 ? ?Patient reports good appetite and is eating normally for him. He eats 2 meals daily. That usually includes a breakfast sandwich and then either a [place], egg and cheese sandwich or Rice a Roni with hamburgers, cucumbers and biscuits for dinner. He drinks a lot of milk and juice. Reports he doesn't like vegetables but eats some fruit. He does not follow a special diet for DM or Renal disease and is not interested in changing. He did agree to eating some whole fruit instead of juice. ? ?States he had one episode of vomiting on Monday. He has had ongoing issues with diarrhea. He took 2 imodium on Monday and has not had a stool since. ? ?His main concern today is food insecurity and his financial situation. States he does not qualify for Food stamps anymore. ? ?Nutrition Diagnosis: ?Food and Nutrition Related Knowledge Deficit related to rectal cancer and associated treatments as evidenced by no prior need for nutrition related information. ? ?Intervention: ?Refer to SW for available resources. ?Provided 1 case of Vanilla  Ensure HP and a bag from the food pantry. ?Brief information provided on low cost healthier foods. ?Education provided on healthy plant based diet and strategies for improving Diarrhea. Encouraged patient to add whole fruit as tolerated. Discouraged fruit juice. ?Educated on importance of eating regularly.Encouraged 3 meals daily with adequate protein. ?Fact sheets given.  ?Questions answered. ? ?Monitoring, Evaluation, Goals: ?Patient will consume more plant based foods and try to eat at least 3 meals daily. ? ?Next Visit: No follow up scheduled. ?

## 2021-06-07 ENCOUNTER — Other Ambulatory Visit: Payer: Self-pay | Admitting: *Deleted

## 2021-06-07 DIAGNOSIS — C20 Malignant neoplasm of rectum: Secondary | ICD-10-CM

## 2021-06-11 ENCOUNTER — Encounter: Payer: Self-pay | Admitting: Gastroenterology

## 2021-06-12 ENCOUNTER — Ambulatory Visit: Payer: Medicaid Other | Attending: Oncology | Admitting: Physical Therapy

## 2021-06-12 ENCOUNTER — Encounter: Payer: Self-pay | Admitting: Licensed Clinical Social Worker

## 2021-06-12 ENCOUNTER — Other Ambulatory Visit: Payer: Self-pay

## 2021-06-12 ENCOUNTER — Encounter: Payer: Self-pay | Admitting: Physical Therapy

## 2021-06-12 DIAGNOSIS — M62838 Other muscle spasm: Secondary | ICD-10-CM | POA: Insufficient documentation

## 2021-06-12 DIAGNOSIS — R293 Abnormal posture: Secondary | ICD-10-CM | POA: Diagnosis not present

## 2021-06-12 DIAGNOSIS — M6281 Muscle weakness (generalized): Secondary | ICD-10-CM | POA: Diagnosis not present

## 2021-06-12 DIAGNOSIS — C20 Malignant neoplasm of rectum: Secondary | ICD-10-CM | POA: Insufficient documentation

## 2021-06-12 DIAGNOSIS — R279 Unspecified lack of coordination: Secondary | ICD-10-CM | POA: Insufficient documentation

## 2021-06-12 NOTE — Progress Notes (Signed)
[facility] Clinical Social Work  ?Initial Assessment ? ? ?Craig West is a 56 y.o. year old male contacted by phone. Clinical Social Work was referred by medical provider for assessment of psychosocial needs.  ? ?SDOH (Social Determinants of Health) assessments performed: Yes ?SDOH Interventions   ? ?Flowsheet Row Most Recent Value  ?SDOH Interventions   ?Food Insecurity Interventions Other (Comment)  [[facility]]  ?Financial Strain Interventions Development worker, community, Other (Comment)  ?Housing Interventions Intervention Not Indicated  ?Intimate Partner Violence Interventions Intervention Not Indicated  ?Physical Activity Interventions Intervention Not Indicated, Patient Refused  ?Stress Interventions Provide Counseling  ?Social Connections Interventions Intervention Not Indicated  ?Transportation Interventions Intervention Not Indicated  ? ?  ?  ?Distress Screen completed: No ?ONCBCN DISTRESS SCREENING 01/27/2020  ?Screening Type Initial Screening  ?Distress experienced in past week (1-10) 8  ?Practical problem type Work/school;Insurance  ?Emotional problem type Adjusting to illness  ?Information Concerns Type Lack of info about diagnosis;Lack of info about treatment;Lack of info about complementary therapy choices  ?Physician notified of physical symptoms Yes  ?Referral to clinical psychology No  ?Referral to clinical social work No  ?Referral to dietition No  ?Referral to financial advocate No  ?Referral to support programs No  ?Referral to palliative care No  ?Other -  ? ? ? ? ?Family/Social Information:  ?Housing Arrangement: patient lives with mother Saverio, Kader (Mother)  ?[phone] (Mobile) ?Family members/support persons in your life? Family and Medical Staff ?Transportation concerns: no  ?Employment: Unemployed. Income source: No income ?Financial concerns: Yes, due to illness and/or loss of work during treatment ?Type of concern: Utilities, Medical bills, and Food ?Food access concerns: yes ?Religious  or spiritual practice: N/A ?Services Currently in place:  Medicaid, patient does not meet criteria for food stamp benefits. ? ?Coping/ Adjustment to diagnosis: ?Patient understands treatment plan and what happens next? yes ?Concerns about diagnosis and/or treatment: Pain or discomfort during procedures, Embarrassment, Afraid of cancer, and How I will pay for the services I need ?Patient reported stressors: Housing, Work/ school, Finances, Haematologist, Anxiety, Adjusting to my illness, and Isolation/ feeling alone ?Hopes and priorities: To qualify for some form of financial assistance and food assistance ?Patient enjoys  N/A ?Current coping skills/ strengths: Average or above average intelligence , Capable of independent living , Communication skills , Motivation for treatment/growth , Physical Health , and Work skills  ? ? ? SUMMARY: ?Current SDOH Barriers:  ?Financial constraints related to lack of consistent income, Limited social support, Mental Health Concerns , Social Isolation, and patient will not meet criteria for certain state and federal assistance programs due to record. ? ?Clinical Social Work Clinical Goal(s):  ?patient will follow up with [employer]* as directed by SW ? ?Interventions: ?Discussed common feeling and emotions when being diagnosed with cancer, and the importance of support during treatment ?Informed patient of the support team roles and support services at [facility] ?Provided CSW contact information and encouraged patient to call with any questions or concerns ?Referred patient to [facility] and [facility] and Provided patient with information about CSW role in patient care and available resources ? ? ?Follow Up Plan: CSW will follow-up with patient by phone  ?Patient verbalizes understanding of plan: Yes ? ?CSW will send referral to [facility] for [employer] for financial assistance with [employer]. CSW requested food bags for  [employer].  We are currently short on food items and CSW will notify patient once there is more. ? ?Maurice Small  Jerolene Kupfer, LCSW ?

## 2021-06-12 NOTE — Therapy (Signed)
?OUTPATIENT PHYSICAL THERAPY MALE PELVIC EVALUATION ? ? ?Patient Name: Craig West ?MRN: [id number] ?DOB:24-Oct-1965, 56 y.o., male ?Today's Date: 06/12/2021 ? ? ? ?Past Medical History:  ?Diagnosis Date  ? Asthma   ? CHF (congestive heart failure) ([place])   ? CKD (chronic kidney disease), stage IV ([place])   ? COVID-19 virus infection 04/2019  ? Diabetes mellitus without complication ([place])   ? Fatigue 11/23/2020  ? Gout   ? Hypertension   ? Leg heaviness 11/23/2020  ? NSVT (nonsustained ventricular tachycardia) 11/23/2020  ? Rectal cancer ([place])   ? ?Past Surgical History:  ?Procedure Laterality Date  ? BIOPSY  01/11/2020  ? Procedure: BIOPSY;  Surgeon: Irene Shipper, MD;  Location: [facility] ENDOSCOPY;  Service: Endoscopy;;  ? COLONOSCOPY WITH PROPOFOL N/A 01/11/2020  ? Procedure: COLONOSCOPY WITH PROPOFOL;  Surgeon: Irene Shipper, MD;  Location: [facility] ENDOSCOPY;  Service: Endoscopy;  Laterality: N/A;  ? NO PAST SURGERIES    ? POLYPECTOMY  01/11/2020  ? Procedure: POLYPECTOMY;  Surgeon: Irene Shipper, MD;  Location: [facility] ENDOSCOPY;  Service: Endoscopy;;  ? SUBMUCOSAL TATTOO INJECTION  01/11/2020  ? Procedure: SUBMUCOSAL TATTOO INJECTION;  Surgeon: Irene Shipper, MD;  Location: [facility] ENDOSCOPY;  Service: Endoscopy;;  ? ?Patient Active Problem List  ? Diagnosis Date Noted  ? Fatigue 11/23/2020  ? Leg heaviness 11/23/2020  ? NSVT (nonsustained ventricular tachycardia) 11/23/2020  ? Diabetes mellitus without complication ([place])   ? CKD (chronic kidney disease), stage IV ([place])   ? PICC (peripherally inserted central catheter) in place 03/27/2020  ? Rectal cancer ([place]) 01/27/2020  ? Benign neoplasm of ascending colon   ? Benign neoplasm of descending colon   ? Benign neoplasm of sigmoid colon   ? Rectal mass   ? Rectal bleeding   ? Iron deficiency anemia   ? Chronic diastolic heart failure ([place])   ? Hyperkalemia   ? Volume overload 01/06/2020  ? Demand ischemia ([place])   ? COVID-19 virus infection 04/2019  ? Obesity (BMI 30.0-34.9) 12/09/2017  ?  Non compliance w medication regimen 04/18/2017  ? Gout 08/02/2016  ? Diabetic neuropathy ([place]) 08/02/2016  ? Hyperlipidemia 10/24/2015  ? Asthma 10/16/2015  ? Type 2 diabetes mellitus without complication ([place]) 56/43/3295  ? Essential hypertension 06/13/2014  ? Smoker 06/13/2014  ? ? ?PCP: Charlott Rakes, MD ? ?REFERRING PROVIDER: Ladell Pier, MD ? ?REFERRING DIAG: C20 (ICD-10-CM) - Rectal cancer ([place]) ? ?THERAPY DIAG:  ?Muscle weakness (generalized) ? ?Abnormal posture ? ?Unspecified lack of coordination ? ?Other muscle spasm ? ?ONSET DATE: 12/2019 ? ?SUBJECTIVE:                                                                                                                                                                                          ? ?  SUBJECTIVE STATEMENT: ?Pt reports he has been having diarrhea since surgery status post diagnosis with rectal CA in 2021. Pt reports 12-13x Bms per day usually a lot of small Bms. ?Fluid intake: Yes: 2-3 glasses of water per day, sometimes more soda/juices per day.   ? ?Patient confirms identification and approves PT to assess pelvic floor and treatment Yes ? ?PERTINENT HISTORY:  ?Per chart review: ?Rectal cancer-rectal mass at 5-6 cm from the anal verge on colonoscopy 01/11/2020, biopsy confirmed adenocarcinoma ?MRI pelvis 01/27/2020-[id number] tumor at 7.1 cm from the internal anal sphincter, multiple mesorectal nodes, indistinct left pelvic sidewall node and enlarged bilateral external iliac nodes ?PET scan 02/11/2020-hypermetabolic rectosigmoid mass, hypermetabolic mesorectal and sigmoid mesocolon lymph nodes, left external iliac/obturator node with mild hypermetabolism, 18 mm left inguinal node with normal architecture and slight hypermetabolism ?Radiation and infusional 5-FU 02/21/2020-04/04/2020 ?CTs 05/09/2020- no evidence of disease progression ?MRI pelvis 05/17/2020-no well-defined residual rectal mass seen.  Wall appears slightly irregular.  No perirectal  extension of tumor.  Again identified is perirectal adenopathy, similar to most recent CT 05/10/2020 but improved compared to PET/CT of 02/11/2020. ?Cycle 1 FOLFOX 06/14/2020 ?06/27/2020 chemotherapy held due to hypertension ?Cycle 2 FOLFOX 06/29/2020 ?Cycle 3 FOLFOX 07/13/2020 ?Cycle 4 FOLFOX 07/27/2020 ?Cycle 5 FOLFOX 08/09/2020 ?Cycle 6 FOLFOX 08/24/2020 ?Cycle 7 FOLFOX 09/07/2020 ?Cycle 8 FOLFOX 09/21/2020 ?MRI pelvis 10/16/2020-no residual rectal mass, small presacral/perirectal nodes improved from previous MRI ?12/06/2020 open low anterior resection with diverting loop colostomy, Dr. Hester Mates; pathology-rectal tumor location entirely below anterior peritoneal reflection; adenocarcinoma; minimal residual tumor; invades submucosa; no macroscopic tumor perforation; no lymphovascular invasion; no perineural invasion; treatment effect present with single cells or rare small groups of cancer cells (near complete response, score 1); all margins negative for invasive carcinoma; 2 of 13 lymph nodes positive; pT1, pN1b ?04/04/2021 transverse loop colostomy takedown ?CT chest 05/04/2021 (CT abdomen/pelvis October 2022)-no evidence of metastatic disease ?Anemia ?Chronic renal failure ?Congestive heart failure-severe LVH ?Diabetes ?Gout ?Peripheral neuropathy ?Hypertension ?Fever/chills 01/03/2021-CT with air-fluid collection in the presacral space extending from the level of the anus to the level of S2 worrisome for abscess or contained perforation.  Transferred to [place].  Drain placed.  Culture positive for E. coli.  Drain removed 01/07/2021.  Repeat CT 01/08/2021 showed near resolution of the fluid component of presacral collection with persistent thick soft tissue rind not amenable to drainage.  Drain was not replaced. ?Sexual abuse: No ? ?PAIN:  ?Are you having pain? Yes ?NPRS scale: 9/10 ?Pain location: Anal ? ?Pain type: sharp ?Pain description: intermittent  ? ?Aggravating factors: having a BM, sitting regardless of seat and uses a donut  cushion  ?Relieving factors: getting up from toilet ? ? ?BOWEL MOVEMENT ?Pain with bowel movement: Yes ?Type of bowel movement:Type (Bristol Stool Scale) 4-5, Frequency 12-13x/day, and Strain Yes ?Fully empty rectum: No ?Leakage: Yes: yes sometimes taking multiple showers per day to clean ?Pads: No ?Fiber supplement: No Imodium he has started which sometimes lowers the amount but not a lot.  ? ?URINATION ?Pain with urination: No ?Fully empty bladder: No ?Stream: Strong ?Urgency: No ?Frequency: no ?Leakage:  none  ?Pads: No ? ?INTERCOURSE ?Pain with intercourse:  yes  ?Ejaculation: Yes: with pain  and sometimes feels like he will climax but not ? ? ?PRECAUTIONS: Other: 08/2020 ? ?WEIGHT BEARING RESTRICTIONS No ? ?FALLS:  ?Has patient fallen in last 6 months? No, Number of falls: 0 ? ?LIVING ENVIRONMENT: ?Lives with:  with with friends  ?Lives in: House/apartment ?Has following equipment  at home: None ? 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 ?PLOF: Independent ? ?PATIENT GOALS to have regular BMs ? ? ?OBJECTIVE:  ? ?DIAGNOSTIC FINDINGS:  ?PATIENT SURVEYS: ? ?PFIQ-7 100 ? ?COGNITION: ? Overall cognitive status: Within functional limits for tasks assessed   ?  ?SENSATION: ? Light touch: Appears intact ? Proprioception: Appears intact ? ?MUSCLE LENGTH: ?Bil hamstrings and adductors limited by 50% and HIP IR by 25% ? ? ?POSTURE:  ?Rounded shoulders, posterior pelvic tilt ? ?PALPATION: ?Internal Pelvic Floor deferred at pt request ? ?External Perineal Exam deferred pt at request ? ?GENERAL all abdominal quadrants noted to have fascial restrictions in all directions, decreased mobility at scar site from ostomy reversal (04/2021). No TTP throughout palpations ? ?LUMBARAROM/PROM ? ?Lumbar mobility decreased by 50% in side bending and extension; 75% in flexion and bil rotation ? ?LE AROM/PROM: ? ?Bil LE WFL throughout ? ?LE MMT: ? ?Bil hip abduction 3/5 all others 4/5 ? ?PELVIC MMT: ?  ?MMT   ?06/12/2021  ?Internal Anal Sphincter Deferred at pt request  ?External Anal Sphincter   ?Puborectalis   ?Diastasis Recti   ?(Blank rows = not tested) ? ? ?TONE: ?Deferred all internal assessment or external vis

## 2021-06-12 NOTE — Patient Instructions (Signed)

## 2021-06-19 ENCOUNTER — Encounter: Payer: Self-pay | Admitting: Licensed Clinical Social Worker

## 2021-06-19 NOTE — Progress Notes (Signed)
[facility] CSW Progress Note ? ?Clinical Social Worker contacted patient by phone to discuss One [employer] Further nutrition referral.  CSW sent referral form to patient via email [email or web address].  CSW requested patient fill out form and emailed it back and to contact me if there are any questions.  Patient verbalized understanding. ? ? ?Nihal Marzella , LCSW ?

## 2021-06-26 ENCOUNTER — Ambulatory Visit: Payer: Self-pay

## 2021-06-26 NOTE — Telephone Encounter (Signed)
Patient has been scheduled for this concern. Appt 3/29 at 8am with [facility] at [facility]. ?

## 2021-06-26 NOTE — Telephone Encounter (Signed)
? ? ?  Chief Complaint: Dizziness, diarrhea ?Symptoms: Above ?Frequency: Started today ?Pertinent Negatives: Patient denies  ?Disposition: '[]'$ ED /'[]'$ Urgent Care (no appt availability in office) / '[]'$ Appointment(In office/virtual)/ '[]'$  [facility]/ '[]'$ Home Care/ '[]'$ Refused Recommended Disposition /'[]'$ [facility]/ '[]'$  Follow-up with PCP ?Additional Notes: Pt. Asking to be worked in today. Refuses virtual appointment. Please advise pt. Instructed to go to ED for worsening of symptoms.  ?Answer Assessment - Initial Assessment Questions ?1. DESCRIPTION: "Describe your dizziness." ?    Dizzy ?2. LIGHTHEADED: "Do you feel lightheaded?" (e.g., somewhat faint, woozy, weak upon standing) ?    Yes ?3. VERTIGO: "Do you feel like either you or the room is spinning or tilting?" (i.e. vertigo) ?    No ?4. SEVERITY: "How bad is it?"  "Do you feel like you are going to faint?" "Can you stand and walk?" ?  - MILD: Feels slightly dizzy, but walking normally. ?  - MODERATE: Feels unsteady when walking, but not falling; interferes with normal activities (e.g., school, work). ?  - SEVERE: Unable to walk without falling, or requires assistance to walk without falling; feels like passing out now.  ?    Moderate ?5. ONSET:  "When did the dizziness begin?" ?    Today ?6. AGGRAVATING FACTORS: "Does anything make it worse?" (e.g., standing, change in head position) ?    Movement ?7. HEART RATE: "Can you tell me your heart rate?" "How many beats in 15 seconds?"  (Note: not all patients can do this)   ?    No ?8. CAUSE: "What do you think is causing the dizziness?" ?    Unsure ?9. RECURRENT SYMPTOM: "Have you had dizziness before?" If Yes, ask: "When was the last time?" "What happened that time?" ?    No ?10. OTHER SYMPTOMS: "Do you have any other symptoms?" (e.g., fever, chest pain, vomiting, diarrhea, bleeding) ?      No ?11. PREGNANCY: "Is there any chance you are pregnant?" "When was your last menstrual period?" ?       N/a ? ?Protocols used: Dizziness - Lightheadedness-A-AH ? ?

## 2021-06-27 ENCOUNTER — Other Ambulatory Visit: Payer: Self-pay

## 2021-06-27 ENCOUNTER — Encounter: Payer: Self-pay | Admitting: Physician Assistant

## 2021-06-27 ENCOUNTER — Ambulatory Visit (INDEPENDENT_AMBULATORY_CARE_PROVIDER_SITE_OTHER): Payer: Medicaid Other | Admitting: Physician Assistant

## 2021-06-27 VITALS — BP 125/74 | HR 71 | Temp 98.6°F | Resp 18 | Ht 73.0 in | Wt 226.0 lb

## 2021-06-27 DIAGNOSIS — N401 Enlarged prostate with lower urinary tract symptoms: Secondary | ICD-10-CM

## 2021-06-27 DIAGNOSIS — E7800 Pure hypercholesterolemia, unspecified: Secondary | ICD-10-CM

## 2021-06-27 DIAGNOSIS — R42 Dizziness and giddiness: Secondary | ICD-10-CM

## 2021-06-27 DIAGNOSIS — Z85048 Personal history of other malignant neoplasm of rectum, rectosigmoid junction, and anus: Secondary | ICD-10-CM | POA: Diagnosis not present

## 2021-06-27 DIAGNOSIS — I5032 Chronic diastolic (congestive) heart failure: Secondary | ICD-10-CM | POA: Diagnosis not present

## 2021-06-27 DIAGNOSIS — E119 Type 2 diabetes mellitus without complications: Secondary | ICD-10-CM

## 2021-06-27 DIAGNOSIS — M1A00X0 Idiopathic chronic gout, unspecified site, without tophus (tophi): Secondary | ICD-10-CM

## 2021-06-27 DIAGNOSIS — N184 Chronic kidney disease, stage 4 (severe): Secondary | ICD-10-CM | POA: Diagnosis not present

## 2021-06-27 DIAGNOSIS — I110 Hypertensive heart disease with heart failure: Secondary | ICD-10-CM | POA: Diagnosis not present

## 2021-06-27 LAB — POCT GLYCOSYLATED HEMOGLOBIN (HGB A1C): Hemoglobin A1C: 5.9 % — AB (ref 4.0–5.6)

## 2021-06-27 MED ORDER — TAMSULOSIN HCL 0.4 MG PO CAPS
0.4000 mg | ORAL_CAPSULE | Freq: Every day | ORAL | 2 refills | Status: DC
  Filled 2021-06-27 – 2021-08-08 (×2): qty 30, 30d supply, fill #0

## 2021-06-27 MED ORDER — ISOSORBIDE MONONITRATE ER 60 MG PO TB24
ORAL_TABLET | Freq: Every day | ORAL | 6 refills | Status: DC
  Filled 2021-06-27 – 2021-08-08 (×2): qty 30, 30d supply, fill #0

## 2021-06-27 NOTE — Progress Notes (Signed)
? ?Established Patient Office Visit ? ?Subjective:  ?Patient ID: Craig West, male    DOB: 1965/09/07  Age: 56 y.o. MRN: [id number] ? ?CC:  ?Chief Complaint  ?Patient presents with  ? Dizziness  ? ? ?HPI ?Craig West reports that he has been having episodes of dizziness, states that they occur approximately twice a month.  Does states that he feels they occur 10 minutes after taking his hydralazine, but then states that he takes his medications together and is unsure if it is due to medication.  States that he does not check his blood pressure at home, does not check his blood pressure or blood glucose during episodes of dizziness.  States the dizziness only lasts a few moments. ? ?States that he has been having inconsistent bowels since having his colostomy bag taken off.  States that he will feel constipated and then have several bowel movements in 1 day.  Has not tried anything for relief, does not drink much water on a daily basis.  States that he did start physical therapy as recommended by surgeon, states that he went to the first visit, did not feel the exercises were necessary and did not try any of them at home.  States his next appointment is tomorrow. ? ? ? ?Past Medical History:  ?Diagnosis Date  ? Asthma   ? CHF (congestive heart failure) ([place])   ? CKD (chronic kidney disease), stage IV ([place])   ? COVID-19 virus infection 04/2019  ? Diabetes mellitus without complication ([place])   ? Fatigue 11/23/2020  ? Gout   ? Hypertension   ? Leg heaviness 11/23/2020  ? NSVT (nonsustained ventricular tachycardia) 11/23/2020  ? Rectal cancer ([place])   ? ? ?Past Surgical History:  ?Procedure Laterality Date  ? BIOPSY  01/11/2020  ? Procedure: BIOPSY;  Surgeon: Irene Shipper, MD;  Location: [facility] ENDOSCOPY;  Service: Endoscopy;;  ? COLONOSCOPY WITH PROPOFOL N/A 01/11/2020  ? Procedure: COLONOSCOPY WITH PROPOFOL;  Surgeon: Irene Shipper, MD;  Location: [facility] ENDOSCOPY;  Service: Endoscopy;  Laterality: N/A;  ? NO PAST SURGERIES    ?  POLYPECTOMY  01/11/2020  ? Procedure: POLYPECTOMY;  Surgeon: Irene Shipper, MD;  Location: [facility] ENDOSCOPY;  Service: Endoscopy;;  ? SUBMUCOSAL TATTOO INJECTION  01/11/2020  ? Procedure: SUBMUCOSAL TATTOO INJECTION;  Surgeon: Irene Shipper, MD;  Location: [facility] ENDOSCOPY;  Service: Endoscopy;;  ? ? ?Family History  ?Problem Relation Age of Onset  ? Heart failure Brother   ? ? ?Social History  ? ?Socioeconomic History  ? Marital status: Single  ?  Spouse name: Not on file  ? Number of children: Not on file  ? Years of education: Not on file  ? Highest education level: Not on file  ?Occupational History  ? Not on file  ?Tobacco Use  ? Smoking status: Former  ?  Packs/day: 0.50  ?  Types: Cigarettes  ?  Quit date: 10/27/2019  ?  Years since quitting: 1.6  ? Smokeless tobacco: Never  ?Vaping Use  ? Vaping Use: Never used  ?Substance and Sexual Activity  ? Alcohol use: Yes  ?  Alcohol/week: 0.0 standard drinks  ?  Comment: occ  ? Drug use: No  ? Sexual activity: Not Currently  ?Other Topics Concern  ? Not on file  ?Social History Narrative  ? Not on file  ? ?Social Determinants of Health  ? ?Financial Resource Strain: High Risk  ? Difficulty of Paying Living Expenses: Hard  ?Food Insecurity: Food  Insecurity Present  ? Worried About Charity fundraiser in the Last Year: Often true  ? Ran Out of Food in the Last Year: Often true  ?Transportation Needs: No Transportation Needs  ? Lack of Transportation (Medical): No  ? Lack of Transportation (Non-Medical): No  ?Physical Activity: Inactive  ? Days of Exercise per Week: 0 days  ? Minutes of Exercise per Session: 0 min  ?Stress: Stress Concern Present  ? Feeling of Stress : Rather much  ?Social Connections: Socially Isolated  ? Frequency of Communication with Friends and Family: Twice a week  ? Frequency of Social Gatherings with Friends and Family: Twice a week  ? Attends Religious Services: Never  ? Active Member of Clubs or Organizations: No  ? Attends Archivist  Meetings: Never  ? Marital Status: Divorced  ?Intimate Partner Violence: Not At Risk  ? Fear of Current or Ex-Partner: No  ? Emotionally Abused: No  ? Physically Abused: No  ? Sexually Abused: No  ? ? ?Outpatient Medications Prior to Visit  ?Medication Sig Dispense Refill  ? albuterol (VENTOLIN HFA) 108 (90 Base) MCG/ACT inhaler INHALE 2 PUFFS INTO THE LUNGS EVERY SIX HOURS AS NEEDED FOR WHEEZING OR SHORTNESS OF BREATH. 18 g 1  ? allopurinol (ZYLOPRIM) 100 MG tablet Take 200 mg by mouth daily.    ? amLODipine (NORVASC) 10 MG tablet Take 1 tablet (10 mg total) by mouth daily. 90 tablet 3  ? atorvastatin (LIPITOR) 80 MG tablet Take 1 tablet (80 mg total) by mouth daily. 90 tablet 3  ? Blood Glucose Monitoring Suppl (TRUE METRIX METER) w/Device KIT Check blood sugar fasting and ant bedtime and record 1 kit 0  ? carvedilol (COREG) 25 MG tablet Take 1 tablet (25 mg total) by mouth 2 (two) times daily with a meal. 180 tablet 3  ? gabapentin (NEURONTIN) 300 MG capsule TAKE 1 CAPSULE (300 MG TOTAL) BY MOUTH 2 (TWO) TIMES DAILY. (Patient taking differently: Take 300 mg by mouth 2 (two) times daily.) 60 capsule 5  ? glucose blood (TRUE METRIX BLOOD GLUCOSE TEST) test strip Use as instructed 200 each 12  ? Misc. Devices (DIGITAL GLASS SCALE) MISC Use scale to weight yourself. Increasing weight may indicate fluid overload 1 each 0  ? traMADol (ULTRAM) 50 MG tablet Take 50 mg by mouth every 8 (eight) hours as needed.    ? TRUEplus Lancets 28G MISC Check blood sugar fasting and at bedtime 210 each 2  ? isosorbide mononitrate (IMDUR) 60 MG 24 hr tablet TAKE 1 TABLET (60 MG TOTAL) BY MOUTH DAILY. (Patient taking differently: Take 60 mg by mouth daily.) 30 tablet 6  ? tamsulosin (FLOMAX) 0.4 MG CAPS capsule Take by mouth.    ? Colchicine (MITIGARE) 0.6 MG CAPS Take 1 capsule by mouth as needed. Take 1 tablet (0.6 mg) by mouth at the onset of a gout attack with next dose no sooner than 3 days later (Patient not taking: Reported on  06/27/2021) 30 capsule 1  ? furosemide (LASIX) 20 MG tablet TAKE 3 TABLETS (60 MG TOTAL) BY MOUTH 2 (TWO) TIMES DAILY. (Patient not taking: Reported on 04/17/2021) 180 tablet 2  ? hydrALAZINE (APRESOLINE) 100 MG tablet Take 1 tablet (100 mg total) by mouth 3 (three) times daily. (Patient not taking: Reported on 06/27/2021) 270 tablet 3  ? ondansetron (ZOFRAN) 4 MG tablet Take 1 tablet (4 mg total) by mouth every 6 (six) hours. (Patient not taking: Reported on 06/27/2021) 12 tablet 0  ?  oseltamivir (TAMIFLU) 75 MG capsule Take 1 capsule (75 mg total) by mouth every 12 (twelve) hours. (Patient not taking: Reported on 06/27/2021) 10 capsule 0  ? cephALEXin (KEFLEX) 500 MG capsule Take 1 capsule (500 mg total) by mouth 2 (two) times daily. 14 capsule 0  ? ?No facility-administered medications prior to visit.  ? ? ?No Known Allergies ? ?ROS ?Review of Systems  ?Constitutional: Negative.   ?HENT: Negative.    ?Eyes: Negative.   ?Respiratory:  Negative for shortness of breath.   ?Cardiovascular:  Negative for chest pain.  ?Gastrointestinal:  Positive for constipation.  ?Endocrine: Negative.   ?Genitourinary: Negative.   ?Musculoskeletal: Negative.   ?Skin: Negative.   ?Allergic/Immunologic: Negative.   ?Neurological:  Positive for dizziness. Negative for syncope, speech difficulty and weakness.  ?Hematological: Negative.   ?Psychiatric/Behavioral: Negative.    ? ?  ?Objective:  ?  ?Physical Exam ?Vitals and nursing note reviewed.  ?Constitutional:   ?   Appearance: Normal appearance.  ?HENT:  ?   Head: Normocephalic and atraumatic.  ?   Right Ear: External ear normal.  ?   Left Ear: External ear normal.  ?   Nose: Nose normal.  ?   Mouth/Throat:  ?   Mouth: Mucous membranes are moist.  ?   Pharynx: Oropharynx is clear.  ?Eyes:  ?   Extraocular Movements: Extraocular movements intact.  ?   Conjunctiva/sclera: Conjunctivae normal.  ?   Pupils: Pupils are equal, round, and reactive to light.  ?Cardiovascular:  ?   Rate and Rhythm:  Normal rate and regular rhythm.  ?   Pulses: Normal pulses.  ?   Heart sounds: Normal heart sounds.  ?Pulmonary:  ?   Effort: Pulmonary effort is normal.  ?   Breath sounds: Normal breath sounds.  ?[place]

## 2021-06-27 NOTE — Progress Notes (Signed)
Patient has taken medication today. ?Patient has not eaten today. ?Patient denies pain at this time. ?Patient reports dizziness after taking BP medications. Patients states normal BP results after 10 minutes of taking medications. ?

## 2021-06-27 NOTE — Patient Instructions (Signed)
I encourage you to check your blood pressure on a daily basis, keep a written log and have available for all office visits.  You are also going to start checking your blood pressure if you have an episode of dizziness.  ? I do encourage you to make sure that you follow-up with physical therapy tomorrow and follow their instructions. ? ?We will call you with today's lab results. ? ?Kennieth Rad, PA-C ?Physician Assistant ?[place] ?[email or web address] ? ? ?Dizziness ?Dizziness is a common problem. It is a feeling of unsteadiness or light-headedness. You may feel like you are about to faint. Dizziness can lead to injury if you stumble or fall. Anyone can become dizzy, but dizziness is more common in older adults. This condition can be caused by a number of things, including medicines, dehydration, or illness. ?Follow these instructions at home: ?Eating and drinking ? ?Drink enough fluid to keep your urine pale yellow. This helps to keep you from becoming dehydrated. Try to drink more clear fluids, such as water. ?Do not drink alcohol. ?Limit your caffeine intake if told to do so by your health care provider. Check ingredients and nutrition facts to see if a food or beverage contains caffeine. ?Limit your salt (sodium) intake if told to do so by your health care provider. Check ingredients and nutrition facts to see if a food or beverage contains sodium. ?Activity ? ?Avoid making quick movements. ?Rise slowly from chairs and steady yourself until you feel okay. ?In the morning, first sit up on the side of the bed. When you feel okay, stand slowly while you hold onto something until you know that your balance is good. ?If you need to stand in one place for a long time, move your legs often. Tighten and relax the muscles in your legs while you are standing. ?Do not drive or use machinery if you feel dizzy. ?Avoid bending down if you feel dizzy. Place items in  your home so that they are easy for you to reach without leaning over. ?Lifestyle ?Do not use any products that contain nicotine or tobacco. These products include cigarettes, chewing tobacco, and vaping devices, such as e-cigarettes. If you need help quitting, ask your health care provider. ?Try to reduce your stress level by using methods such as yoga or meditation. Talk with your health care provider if you need help to manage your stress. ?General instructions ?Watch your dizziness for any changes. ?Take over-the-counter and prescription medicines only as told by your health care provider. Talk with your health care provider if you think that your dizziness is caused by a medicine that you are taking. ?Tell a friend or a family member that you are feeling dizzy. If he or she notices any changes in your behavior, have this person call your health care provider. ?Keep all follow-up visits. This is important. ?Contact a health care provider if: ?Your dizziness does not go away or you have new symptoms. ?Your dizziness or light-headedness gets worse. ?You feel nauseous. ?You have reduced hearing. ?You have a fever. ?You have neck pain or a stiff neck. ?Your dizziness leads to an injury or a fall. ?Get help right away if: ?You vomit or have diarrhea and are unable to eat or drink anything. ?You have problems talking, walking, swallowing, or using your arms, hands, or legs. ?You feel generally weak. ?You have any bleeding. ?You are not thinking clearly or you have trouble forming sentences. It may take a friend or family member  to notice this. ?You have chest pain, abdominal pain, shortness of breath, or sweating. ?Your vision changes or you develop a severe headache. ?These symptoms may represent a serious problem that is an emergency. Do not wait to see if the symptoms will go away. Get medical help right away. Call your local emergency services (911 in the [place] Do not drive yourself to the  hospital. ?Summary ?Dizziness is a feeling of unsteadiness or light-headedness. This condition can be caused by a number of things, including medicines, dehydration, or illness. ?Anyone can become dizzy, but dizziness is more common in older adults. ?Drink enough fluid to keep your urine pale yellow. Do not drink alcohol. ?Avoid making quick movements if you feel dizzy. Monitor your dizziness for any changes. ?This information is not intended to replace advice given to you by your health care provider. Make sure you discuss any questions you have with your health care provider. ?Document Revised: 02/21/2020 Document Reviewed: 02/21/2020 ?Elsevier Patient Education ? [place]. ? ?

## 2021-06-28 ENCOUNTER — Encounter: Payer: Self-pay | Admitting: Oncology

## 2021-06-28 ENCOUNTER — Other Ambulatory Visit: Payer: Self-pay

## 2021-06-28 ENCOUNTER — Ambulatory Visit: Payer: Medicaid Other | Admitting: Physical Therapy

## 2021-06-28 DIAGNOSIS — R293 Abnormal posture: Secondary | ICD-10-CM

## 2021-06-28 DIAGNOSIS — R279 Unspecified lack of coordination: Secondary | ICD-10-CM | POA: Diagnosis not present

## 2021-06-28 DIAGNOSIS — M62838 Other muscle spasm: Secondary | ICD-10-CM

## 2021-06-28 DIAGNOSIS — M6281 Muscle weakness (generalized): Secondary | ICD-10-CM | POA: Diagnosis not present

## 2021-06-28 DIAGNOSIS — C20 Malignant neoplasm of rectum: Secondary | ICD-10-CM | POA: Diagnosis not present

## 2021-06-28 LAB — CBC WITH DIFFERENTIAL/PLATELET
Basophils Absolute: 0.1 10*3/uL (ref 0.0–0.2)
Basos: 1 %
EOS (ABSOLUTE): 0.3 10*3/uL (ref 0.0–0.4)
Eos: 4 %
Hematocrit: 34.5 % — ABNORMAL LOW (ref 37.5–51.0)
Hemoglobin: 11.1 g/dL — ABNORMAL LOW (ref 13.0–17.7)
Immature Grans (Abs): 0 10*3/uL (ref 0.0–0.1)
Immature Granulocytes: 1 %
Lymphocytes Absolute: 1 10*3/uL (ref 0.7–3.1)
Lymphs: 12 %
MCH: 26.5 pg — ABNORMAL LOW (ref 26.6–33.0)
MCHC: 32.2 g/dL (ref 31.5–35.7)
MCV: 82 fL (ref 79–97)
Monocytes Absolute: 0.6 10*3/uL (ref 0.1–0.9)
Monocytes: 7 %
Neutrophils Absolute: 6.3 10*3/uL (ref 1.4–7.0)
Neutrophils: 75 %
Platelets: 236 10*3/uL (ref 150–450)
RBC: 4.19 x10E6/uL (ref 4.14–5.80)
RDW: 16.6 % — ABNORMAL HIGH (ref 11.6–15.4)
WBC: 8.4 10*3/uL (ref 3.4–10.8)

## 2021-06-28 LAB — COMP. METABOLIC PANEL (12)
AST: 14 IU/L (ref 0–40)
Albumin/Globulin Ratio: 1.2 (ref 1.2–2.2)
Albumin: 3.9 g/dL (ref 3.8–4.9)
Alkaline Phosphatase: 127 IU/L — ABNORMAL HIGH (ref 44–121)
BUN/Creatinine Ratio: 15 (ref 9–20)
BUN: 43 mg/dL — ABNORMAL HIGH (ref 6–24)
Bilirubin Total: 0.7 mg/dL (ref 0.0–1.2)
Calcium: 8.5 mg/dL — ABNORMAL LOW (ref 8.7–10.2)
Chloride: 105 mmol/L (ref 96–106)
Creatinine, Ser: 2.9 mg/dL — ABNORMAL HIGH (ref 0.76–1.27)
Globulin, Total: 3.2 g/dL (ref 1.5–4.5)
Glucose: 156 mg/dL — ABNORMAL HIGH (ref 70–99)
Potassium: 4.4 mmol/L (ref 3.5–5.2)
Sodium: 139 mmol/L (ref 134–144)
Total Protein: 7.1 g/dL (ref 6.0–8.5)
eGFR: 24 mL/min/{1.73_m2} — ABNORMAL LOW (ref >59)

## 2021-06-28 LAB — LIPID PANEL
Chol/HDL Ratio: 4.8 ratio (ref 0.0–5.0)
Cholesterol, Total: 140 mg/dL (ref 100–199)
HDL: 29 mg/dL — ABNORMAL LOW (ref >39)
LDL Chol Calc (NIH): 81 mg/dL (ref 0–99)
Triglycerides: 172 mg/dL — ABNORMAL HIGH (ref 0–149)
VLDL Cholesterol Cal: 30 mg/dL (ref 5–40)

## 2021-06-28 LAB — TSH: TSH: 1.99 u[IU]/mL (ref 0.450–4.500)

## 2021-06-28 LAB — URIC ACID: Uric Acid: 8.4 mg/dL (ref 3.8–8.4)

## 2021-06-28 MED ORDER — ALLOPURINOL 100 MG PO TABS
50.0000 mg | ORAL_TABLET | ORAL | 0 refills | Status: DC
  Filled 2021-06-28 – 2021-08-08 (×2): qty 15, 60d supply, fill #0

## 2021-06-28 NOTE — Patient Instructions (Signed)

## 2021-06-28 NOTE — Addendum Note (Signed)
Addended by: Kennieth Rad on: 06/28/2021 11:25 AM ? ? Modules accepted: Orders ? ?

## 2021-06-28 NOTE — Therapy (Signed)
?OUTPATIENT PHYSICAL THERAPY TREATMENT NOTE ? ? ?Patient Name: Craig West ?MRN: [id number] ?DOB:Aug 25, 1965, 56 y.o., male ?Today's Date: 06/28/2021 ? ?PCP: Charlott Rakes, MD ?REFERRING PROVIDER: Charlott Rakes, MD ? ? PT End of Session - 06/28/21 0753   ? ? Visit Number 2   ? Date for PT Re-Evaluation 09/12/21   ? Authorization Type healthy blue   ? PT Start Time 0800   ? PT Stop Time [phone]   ? PT Time Calculation (min) 39 min   ? Activity Tolerance Patient tolerated treatment well   ? Behavior During Therapy [facility] for tasks assessed/performed   ? ?  ?  ? ?  ? ? ?Past Medical History:  ?Diagnosis Date  ? Asthma   ? CHF (congestive heart failure) ([place])   ? CKD (chronic kidney disease), stage IV ([place])   ? COVID-19 virus infection 04/2019  ? Diabetes mellitus without complication ([place])   ? Fatigue 11/23/2020  ? Gout   ? Hypertension   ? Leg heaviness 11/23/2020  ? NSVT (nonsustained ventricular tachycardia) 11/23/2020  ? Rectal cancer ([place])   ? ?Past Surgical History:  ?Procedure Laterality Date  ? BIOPSY  01/11/2020  ? Procedure: BIOPSY;  Surgeon: Irene Shipper, MD;  Location: [facility] ENDOSCOPY;  Service: Endoscopy;;  ? COLONOSCOPY WITH PROPOFOL N/A 01/11/2020  ? Procedure: COLONOSCOPY WITH PROPOFOL;  Surgeon: Irene Shipper, MD;  Location: [facility] ENDOSCOPY;  Service: Endoscopy;  Laterality: N/A;  ? NO PAST SURGERIES    ? POLYPECTOMY  01/11/2020  ? Procedure: POLYPECTOMY;  Surgeon: Irene Shipper, MD;  Location: [facility] ENDOSCOPY;  Service: Endoscopy;;  ? SUBMUCOSAL TATTOO INJECTION  01/11/2020  ? Procedure: SUBMUCOSAL TATTOO INJECTION;  Surgeon: Irene Shipper, MD;  Location: [facility] ENDOSCOPY;  Service: Endoscopy;;  ? ?Patient Active Problem List  ? Diagnosis Date Noted  ? BPH (benign prostatic hyperplasia) 03/27/2021  ? Fatigue 11/23/2020  ? Leg heaviness 11/23/2020  ? NSVT (nonsustained ventricular tachycardia) 11/23/2020  ? Diabetes mellitus without complication ([place])   ? CKD (chronic kidney disease), stage IV ([place])   ? PICC  (peripherally inserted central catheter) in place 03/27/2020  ? Rectal cancer ([place]) 01/27/2020  ? Benign neoplasm of ascending colon   ? Benign neoplasm of descending colon   ? Benign neoplasm of sigmoid colon   ? Rectal mass   ? Rectal bleeding   ? Iron deficiency anemia   ? Chronic diastolic heart failure ([place])   ? Hyperkalemia   ? Volume overload 01/06/2020  ? Demand ischemia ([place])   ? COVID-19 virus infection 04/2019  ? Obesity (BMI 30.0-34.9) 12/09/2017  ? Non compliance w medication regimen 04/18/2017  ? Gout 08/02/2016  ? Diabetic neuropathy ([place]) 08/02/2016  ? Hyperlipidemia 10/24/2015  ? Asthma 10/16/2015  ? Type 2 diabetes mellitus without complication ([employer]) 34/19/3790  ? Essential hypertension 06/13/2014  ? Smoker 06/13/2014  ? ? ?REFERRING DIAG: C20 (ICD-10-CM) - Rectal cancer ([place]) ? ?THERAPY DIAG:  ?Muscle weakness (generalized) ? ?Abnormal posture ? ?Unspecified lack of coordination ? ?Other muscle spasm ? ?PERTINENT HISTORY:  ?Per chart review: ?Rectal cancer-rectal mass at 5-6 cm from the anal verge on colonoscopy 01/11/2020, biopsy confirmed adenocarcinoma ?MRI pelvis 01/27/2020-T3bN2b tumor at 7.1 cm from the internal anal sphincter, multiple mesorectal nodes, indistinct left pelvic sidewall node and enlarged bilateral external iliac nodes ?PET scan 02/11/2020-hypermetabolic rectosigmoid mass, hypermetabolic mesorectal and sigmoid mesocolon lymph nodes, left external iliac/obturator node with mild hypermetabolism, 18 mm left inguinal node with normal architecture and slight hypermetabolism ?  Radiation and infusional 5-FU 02/21/2020-04/04/2020 ?CTs 05/09/2020- no evidence of disease progression ?MRI pelvis 05/17/2020-no well-defined residual rectal mass seen.  Wall appears slightly irregular.  No perirectal extension of tumor.  Again identified is perirectal adenopathy, similar to most recent CT 05/10/2020 but improved compared to PET/CT of 02/11/2020. ?Cycle 1 FOLFOX 06/14/2020 ?06/27/2020 chemotherapy held  due to hypertension ?Cycle 2 FOLFOX 06/29/2020 ?Cycle 3 FOLFOX 07/13/2020 ?Cycle 4 FOLFOX 07/27/2020 ?Cycle 5 FOLFOX 08/09/2020 ?Cycle 6 FOLFOX 08/24/2020 ?Cycle 7 FOLFOX 09/07/2020 ?Cycle 8 FOLFOX 09/21/2020 ?MRI pelvis 10/16/2020-no residual rectal mass, small presacral/perirectal nodes improved from previous MRI ?12/06/2020 open low anterior resection with diverting loop colostomy, Dr. Hester Mates; pathology-rectal tumor location entirely below anterior peritoneal reflection; adenocarcinoma; minimal residual tumor; invades submucosa; no macroscopic tumor perforation; no lymphovascular invasion; no perineural invasion; treatment effect present with single cells or rare small groups of cancer cells (near complete response, score 1); all margins negative for invasive carcinoma; 2 of 13 lymph nodes positive; pT1, pN1b ?04/04/2021 transverse loop colostomy takedown ?CT chest 05/04/2021 (CT abdomen/pelvis October 2022)-no evidence of metastatic disease ?Anemia ?Chronic renal failure ?Congestive heart failure-severe LVH ?Diabetes ?Gout ?Peripheral neuropathy ?Hypertension ?Fever/chills 01/03/2021-CT with air-fluid collection in the presacral space extending from the level of the anus to the level of S2 worrisome for abscess or contained perforation.  Transferred to [place].  Drain placed.  Culture positive for E. coli.  Drain removed 01/07/2021.  Repeat CT 01/08/2021 showed near resolution of the fluid component of presacral collection with persistent thick soft tissue rind not amenable to drainage.  Drain was not replaced. ?Sexual abuse: No ? ?PRECAUTIONS: post rectal CA treatments, ostomy reversal 04/2021 ? ?SUBJECTIVE: Pt reports he has continued to have high frequency of Bms daily, a few days had at least 20-30x and large Bms each time. Pt reports he has been increasing water but has not tried other recommendations since eval yet.  ? ?PAIN:  ?Are you having pain? No ? ? ? ? ? ?OBJECTIVE:  ?  ?DIAGNOSTIC FINDINGS:  ?PATIENT SURVEYS: ?   ?PFIQ-7 100 ?  ?COGNITION: ?           Overall cognitive status: Within functional limits for tasks assessed              ?            ?SENSATION: ?           Light touch: Appears intact ?           Proprioception: Appears intact ?  ?MUSCLE LENGTH: ?Bil hamstrings and adductors limited by 50% and HIP IR by 25% ?  ?  ?POSTURE:  ?Rounded shoulders, posterior pelvic tilt ?  ?PALPATION: ?Internal Pelvic Floor deferred at pt request ?  ?External Perineal Exam deferred pt at request ?  ?GENERAL all abdominal quadrants noted to have fascial restrictions in all directions, decreased mobility at scar site from ostomy reversal (04/2021). No TTP throughout palpations ?  ?LUMBARAROM/PROM ?  ?Lumbar mobility decreased by 50% in side bending and extension; 75% in flexion and bil rotation ?  ?LE AROM/PROM: ?  ?Bil LE WFL throughout ?  ?LE MMT: ?  ?Bil hip abduction 3/5 all others 4/5 ?  ?PELVIC MMT: ?  ?MMT   ?06/12/2021 06/28/21  ?Internal Anal Sphincter Deferred at pt request 3/5; isometrics 4s; 6 reps  ?External Anal Sphincter   2/5  ?Puborectalis   3/5  ?Diastasis Recti     ?(Blank rows = not tested) ?  ?  ?  TONE: ?Deferred all internal assessment or external visualization at this time ? 06/28/2021 : consented  this date - mildly decreased ?  ?  ?  ?TODAY'S TREATMENT  ? ?06/28/2021:   ?Education on fiber types and voiding mechanics in more detail and breathing mechanics with balloon breathing technique for improved mechanics for decreased frequency, straining, and pain with BMs.   ?Pt consented to internal pelvic floor assessment rectally this session. Pt found to have weakness at EAS, IAS, and puborectalis details above. Pt also demonstrated deficits in coordination as when he attempted to bulge global tightening was demonstrated and abdominal bulge. Pt educated on this and cued to attempt balloon breathing technique then with this able to relax EAS and demonstrate improved mechanics and bulge. X10 contractions with balloon  breathing technique for improved coordination of pelvic floor and muscle relaxation.  ?HEP updated to include pelvic floor strengthening and isometric holds. Pt verbalized understanding to all education and to attempt to imp

## 2021-07-04 ENCOUNTER — Other Ambulatory Visit: Payer: Self-pay

## 2021-07-05 ENCOUNTER — Telehealth: Payer: Self-pay | Admitting: *Deleted

## 2021-07-05 ENCOUNTER — Other Ambulatory Visit: Payer: Self-pay

## 2021-07-05 NOTE — Telephone Encounter (Signed)
-----   Message from Kennieth Rad, PA-C sent at 06/28/2021 11:25 AM EDT ----- ?Please call patient know that his uric acid levels are above target rate for people with history of gout.  Due to his chronic kidney disease,  he will start taking 50 mg every other day to help these numbers become lowered.  His kidney function has worsened slightly but mostly stable.  His thyroid is within normal limits, he does not show signs of iron deficiency anemia.  His overall cholesterol is within normal limits, however his triglycerides are elevated, it is important to follow a low-fat diet ?

## 2021-07-05 NOTE — Telephone Encounter (Signed)
Patient verified DOB ?Patient is aware of results and needing to take gout medication EOD to address elevated levels and chronic kidney disease.  ?Patient will follow a low cholesterol diet as well. ?

## 2021-07-10 ENCOUNTER — Ambulatory Visit: Payer: Medicaid Other | Attending: Oncology | Admitting: Physical Therapy

## 2021-07-10 DIAGNOSIS — M62838 Other muscle spasm: Secondary | ICD-10-CM | POA: Insufficient documentation

## 2021-07-10 DIAGNOSIS — R293 Abnormal posture: Secondary | ICD-10-CM | POA: Insufficient documentation

## 2021-07-10 DIAGNOSIS — R279 Unspecified lack of coordination: Secondary | ICD-10-CM | POA: Insufficient documentation

## 2021-07-10 DIAGNOSIS — M6281 Muscle weakness (generalized): Secondary | ICD-10-CM | POA: Insufficient documentation

## 2021-07-24 ENCOUNTER — Ambulatory Visit: Payer: Medicaid Other | Attending: Family Medicine | Admitting: Family Medicine

## 2021-07-24 ENCOUNTER — Encounter: Payer: Self-pay | Admitting: Oncology

## 2021-07-24 ENCOUNTER — Encounter: Payer: Self-pay | Admitting: Family Medicine

## 2021-07-24 ENCOUNTER — Ambulatory Visit: Payer: Medicaid Other | Admitting: Physical Therapy

## 2021-07-24 ENCOUNTER — Other Ambulatory Visit: Payer: Self-pay

## 2021-07-24 VITALS — BP 222/138 | HR 73 | Ht 73.0 in | Wt 226.6 lb

## 2021-07-24 DIAGNOSIS — Z85048 Personal history of other malignant neoplasm of rectum, rectosigmoid junction, and anus: Secondary | ICD-10-CM | POA: Diagnosis not present

## 2021-07-24 DIAGNOSIS — E1122 Type 2 diabetes mellitus with diabetic chronic kidney disease: Secondary | ICD-10-CM

## 2021-07-24 DIAGNOSIS — N184 Chronic kidney disease, stage 4 (severe): Secondary | ICD-10-CM | POA: Diagnosis not present

## 2021-07-24 DIAGNOSIS — R279 Unspecified lack of coordination: Secondary | ICD-10-CM | POA: Diagnosis not present

## 2021-07-24 DIAGNOSIS — K648 Other hemorrhoids: Secondary | ICD-10-CM

## 2021-07-24 DIAGNOSIS — M6281 Muscle weakness (generalized): Secondary | ICD-10-CM | POA: Diagnosis not present

## 2021-07-24 DIAGNOSIS — I129 Hypertensive chronic kidney disease with stage 1 through stage 4 chronic kidney disease, or unspecified chronic kidney disease: Secondary | ICD-10-CM

## 2021-07-24 DIAGNOSIS — R293 Abnormal posture: Secondary | ICD-10-CM

## 2021-07-24 DIAGNOSIS — M62838 Other muscle spasm: Secondary | ICD-10-CM | POA: Diagnosis not present

## 2021-07-24 DIAGNOSIS — E1142 Type 2 diabetes mellitus with diabetic polyneuropathy: Secondary | ICD-10-CM | POA: Diagnosis not present

## 2021-07-24 LAB — GLUCOSE, POCT (MANUAL RESULT ENTRY): POC Glucose: 85 mg/dl (ref 70–99)

## 2021-07-24 MED ORDER — HYDROCORT-PRAMOXINE (PERIANAL) 2.5-1 % EX CREA
1.0000 "application " | TOPICAL_CREAM | Freq: Three times a day (TID) | CUTANEOUS | 2 refills | Status: DC
  Filled 2021-07-24: qty 30, 10d supply, fill #0

## 2021-07-24 MED ORDER — POLYETHYLENE GLYCOL 3350 17 GM/SCOOP PO POWD
17.0000 g | Freq: Every day | ORAL | 1 refills | Status: DC
  Filled 2021-07-24 – 2021-08-08 (×2): qty 510, 30d supply, fill #0

## 2021-07-24 MED ORDER — HYDROCORTISONE ACETATE 25 MG RE SUPP
25.0000 mg | Freq: Two times a day (BID) | RECTAL | 1 refills | Status: DC
  Filled 2021-07-24 – 2021-08-08 (×2): qty 30, 15d supply, fill #0

## 2021-07-24 NOTE — Therapy (Signed)
?OUTPATIENT PHYSICAL THERAPY TREATMENT NOTE ? ? ?Patient Name: Craig West ?MRN: [id number] ?DOB:08-02-1965, 56 y.o., male ?Today's Date: 07/24/2021 ? ?PCP: Charlott Rakes, MD ?REFERRING PROVIDER: Ladell Pier, MD ? ? PT End of Session - 07/24/21 0840   ? ? Visit Number 3   ? Date for PT Re-Evaluation 09/12/21   ? Authorization Type healthy blue   ? PT Start Time [phone]   pt arrival time  ? PT Stop Time [phone]   pt requests to leave session early to make it to next appointment this morning.  ? PT Time Calculation (min) 28 min   ? Activity Tolerance Patient tolerated treatment well   ? Behavior During Therapy [facility] for tasks assessed/performed   ? ?  ?  ? ?  ? ? ? ?Past Medical History:  ?Diagnosis Date  ? Asthma   ? CHF (congestive heart failure) ([place])   ? CKD (chronic kidney disease), stage IV ([place])   ? COVID-19 virus infection 04/2019  ? Diabetes mellitus without complication ([place])   ? Fatigue 11/23/2020  ? Gout   ? Hypertension   ? Leg heaviness 11/23/2020  ? NSVT (nonsustained ventricular tachycardia) 11/23/2020  ? Rectal cancer ([place])   ? ?Past Surgical History:  ?Procedure Laterality Date  ? BIOPSY  01/11/2020  ? Procedure: BIOPSY;  Surgeon: Irene Shipper, MD;  Location: [facility] ENDOSCOPY;  Service: Endoscopy;;  ? COLONOSCOPY WITH PROPOFOL N/A 01/11/2020  ? Procedure: COLONOSCOPY WITH PROPOFOL;  Surgeon: Irene Shipper, MD;  Location: [facility] ENDOSCOPY;  Service: Endoscopy;  Laterality: N/A;  ? NO PAST SURGERIES    ? POLYPECTOMY  01/11/2020  ? Procedure: POLYPECTOMY;  Surgeon: Irene Shipper, MD;  Location: [facility] ENDOSCOPY;  Service: Endoscopy;;  ? SUBMUCOSAL TATTOO INJECTION  01/11/2020  ? Procedure: SUBMUCOSAL TATTOO INJECTION;  Surgeon: Irene Shipper, MD;  Location: [facility] ENDOSCOPY;  Service: Endoscopy;;  ? ?Patient Active Problem List  ? Diagnosis Date Noted  ? BPH (benign prostatic hyperplasia) 03/27/2021  ? Fatigue 11/23/2020  ? Leg heaviness 11/23/2020  ? NSVT (nonsustained ventricular tachycardia) ([place]) 11/23/2020  ? Diabetes  mellitus without complication ([place])   ? CKD (chronic kidney disease), stage IV ([place])   ? PICC (peripherally inserted central catheter) in place 03/27/2020  ? Rectal cancer ([place]) 01/27/2020  ? Benign neoplasm of ascending colon   ? Benign neoplasm of descending colon   ? Benign neoplasm of sigmoid colon   ? Rectal mass   ? Rectal bleeding   ? Iron deficiency anemia   ? Chronic diastolic heart failure ([place])   ? Hyperkalemia   ? Volume overload 01/06/2020  ? Demand ischemia ([place])   ? COVID-19 virus infection 04/2019  ? Obesity (BMI 30.0-34.9) 12/09/2017  ? Non compliance w medication regimen 04/18/2017  ? Gout 08/02/2016  ? Diabetic neuropathy ([place]) 08/02/2016  ? Hyperlipidemia 10/24/2015  ? Asthma 10/16/2015  ? Type 2 diabetes mellitus without complication ([place]) 44/81/8563  ? Essential hypertension 06/13/2014  ? Smoker 06/13/2014  ? ? ?REFERRING DIAG: C20 (ICD-10-CM) - Rectal cancer ([place]) ? ?THERAPY DIAG:  ?Muscle weakness (generalized) ? ?Abnormal posture ? ?Unspecified lack of coordination ? ?Other muscle spasm ? ?PERTINENT HISTORY:  ?Per chart review: ?Rectal cancer-rectal mass at 5-6 cm from the anal verge on colonoscopy 01/11/2020, biopsy confirmed adenocarcinoma ?MRI pelvis 01/27/2020-T3bN2b tumor at 7.1 cm from the internal anal sphincter, multiple mesorectal nodes, indistinct left pelvic sidewall node and enlarged bilateral external iliac nodes ?PET scan 02/11/2020-hypermetabolic rectosigmoid mass, hypermetabolic mesorectal and  sigmoid mesocolon lymph nodes, left external iliac/obturator node with mild hypermetabolism, 18 mm left inguinal node with normal architecture and slight hypermetabolism ?Radiation and infusional 5-FU 02/21/2020-04/04/2020 ?CTs 05/09/2020- no evidence of disease progression ?MRI pelvis 05/17/2020-no well-defined residual rectal mass seen.  Wall appears slightly irregular.  No perirectal extension of tumor.  Again identified is perirectal adenopathy, similar to most recent CT 05/10/2020 but  improved compared to PET/CT of 02/11/2020. ?Cycle 1 FOLFOX 06/14/2020 ?06/27/2020 chemotherapy held due to hypertension ?Cycle 2 FOLFOX 06/29/2020 ?Cycle 3 FOLFOX 07/13/2020 ?Cycle 4 FOLFOX 07/27/2020 ?Cycle 5 FOLFOX 08/09/2020 ?Cycle 6 FOLFOX 08/24/2020 ?Cycle 7 FOLFOX 09/07/2020 ?Cycle 8 FOLFOX 09/21/2020 ?MRI pelvis 10/16/2020-no residual rectal mass, small presacral/perirectal nodes improved from previous MRI ?12/06/2020 open low anterior resection with diverting loop colostomy, Dr. Hester Mates; pathology-rectal tumor location entirely below anterior peritoneal reflection; adenocarcinoma; minimal residual tumor; invades submucosa; no macroscopic tumor perforation; no lymphovascular invasion; no perineural invasion; treatment effect present with single cells or rare small groups of cancer cells (near complete response, score 1); all margins negative for invasive carcinoma; 2 of 13 lymph nodes positive; pT1, pN1b ?04/04/2021 transverse loop colostomy takedown ?CT chest 05/04/2021 (CT abdomen/pelvis October 2022)-no evidence of metastatic disease ?Anemia ?Chronic renal failure ?Congestive heart failure-severe LVH ?Diabetes ?Gout ?Peripheral neuropathy ?Hypertension ?Fever/chills 01/03/2021-CT with air-fluid collection in the presacral space extending from the level of the anus to the level of S2 worrisome for abscess or contained perforation.  Transferred to [place].  Drain placed.  Culture positive for E. coli.  Drain removed 01/07/2021.  Repeat CT 01/08/2021 showed near resolution of the fluid component of presacral collection with persistent thick soft tissue rind not amenable to drainage.  Drain was not replaced. ?Sexual abuse: No ? ?PRECAUTIONS: post rectal CA treatments, ostomy reversal 04/2021 ? ?SUBJECTIVE: Pt reports he has continued to have high frequency of Bms daily, a few days had at least 20x and sometimes feels empty but most of the time not, usually small pieces. Pt reports he has been increasing water and uses balloon  breathing and step stool in bathroom and reports this has been helpful with decreased straining.  ? ?PAIN:  ?Are you having pain? No ? ? ? ? ? ?OBJECTIVE:  ?  ?DIAGNOSTIC FINDINGS:  ?PATIENT SURVEYS: ?  ?PFIQ-7 100 ?  ?COGNITION: ?           Overall cognitive status: Within functional limits for tasks assessed              ?            ?SENSATION: ?           Light touch: Appears intact ?           Proprioception: Appears intact ?  ?MUSCLE LENGTH: ?Bil hamstrings and adductors limited by 50% and HIP IR by 25% ?  ?  ?POSTURE:  ?Rounded shoulders, posterior pelvic tilt ?  ?PALPATION: ?Internal Pelvic Floor deferred at pt request ?  ?External Perineal Exam deferred pt at request ?  ?GENERAL all abdominal quadrants noted to have fascial restrictions in all directions, decreased mobility at scar site from ostomy reversal (04/2021). No TTP throughout palpations ?  ?LUMBARAROM/PROM ?  ?Lumbar mobility decreased by 50% in side bending and extension; 75% in flexion and bil rotation ?  ?LE AROM/PROM: ?  ?Bil LE WFL throughout ?  ?LE MMT: ?  ?Bil hip abduction 3/5 all others 4/5 ?  ?PELVIC MMT: ?  ?MMT   ?06/12/2021 06/28/21  ?Internal  Anal Sphincter Deferred at pt request 3/5; isometrics 4s; 6 reps  ?External Anal Sphincter   2/5  ?Puborectalis   3/5  ?Diastasis Recti     ?(Blank rows = not tested) ?  ?  ?TONE: ?Deferred all internal assessment or external visualization at this time ? 06/28/2021 : consented  this date - mildly decreased ?  ?  ?  ?TODAY'S TREATMENT  ? ?07/24/2021: ? Educated on continued HEP as pt reports he hasn't really been doing this a lot, and re-educated on abdominal massage as he reports he hasn't tried this much.  ?X10 lower trunk rotations in hooklying ?3x30s butterfly stretch ?2x45s foam roller for thoracic extension ?X10 diaphragmatic breathing in hooklying   ? ?06/28/2021:   ?Education on fiber types and voiding mechanics in more detail and breathing mechanics with balloon breathing technique for  improved mechanics for decreased frequency, straining, and pain with BMs.   ?Pt consented to internal pelvic floor assessment rectally this session. Pt found to have weakness at EAS, IAS, and puborectalis details abo

## 2021-07-24 NOTE — Progress Notes (Signed)
Gets dizzy once he takes all his medication in the morning. ? ?Has trouble with bowls after surgery. Also constipation. ?

## 2021-07-24 NOTE — Patient Instructions (Signed)
Stop taking isosorbide due to complaints of dizziness but take all your other medications.  Bring all your medications with you to the clinical pharmacy visit to reassess your blood pressure. ?

## 2021-07-24 NOTE — Progress Notes (Signed)
? ?Subjective:  ?Patient ID: Craig West, male    DOB: Dec 07, 1965  Age: 56 y.o. MRN: [id number] ? ?CC: Diabetes ? ? ?HPI ?Craig West is a 56 y.o. year old male with a history of hypertension, type 2 diabetes mellitus (A1c 5.9), asthma, gout, stage IV chronic kidney disease, Hypertensive Heart Disease, Rectal carcinoma diagnosed in 12/2018 (completed chemotherapy and radiation), s/p Low anterior resection with diverting Colostomy at [facility] on 12/06/20 with subsequent reversal of colostomy here for a follow up visit. ? ?Interval History: ?His blood pressure is significantly elevated at 222/138 and he has not taken his antihypertensives today.  Denies presence of headache, chest pain, weakness he states he was in a hurry to get to an appointment prior to this 1.  He also complains that when he takes his antihypertensives his blood pressure drops too low and he gets dizzy but he is unable to provide any objective measure of his blood pressure.  He informs me he thinks his medications are too many.  Of note last BP was 125/74 last month. ? ?Last visit with oncology was on 05/2021, note reviewed with no evidence of orthostatic disease per notes. ?CT chest from 05/2021 revealed: ?IMPRESSION: ?1. No acute chest findings or evidence of thoracic metastatic ?disease. ?2. Previously demonstrated small pleural effusions have resolved. ?3. Minimal coronary artery atherosclerosis. ? ?He complains of hyperdefecation and states he can move his bowel 20 times a day and it feels raw and stings in his rectum. Feels like he has to go to the bathroom all the time.  Endorses being constipated. ?He is currently undergoing rehab ? ?His Gout has been stable ?He has numbness in his feet which feel like he is walking on ice.  He is currently on gabapentin. ?For diabetes he is on diet control. ? ? ?Past Medical History:  ?Diagnosis Date  ? Asthma   ? CHF (congestive heart failure) ([place])   ? CKD (chronic kidney disease), stage IV ([place])   ? COVID-19  virus infection 04/2019  ? Diabetes mellitus without complication ([place])   ? Fatigue 11/23/2020  ? Gout   ? Hypertension   ? Leg heaviness 11/23/2020  ? NSVT (nonsustained ventricular tachycardia) ([place]) 11/23/2020  ? Rectal cancer ([place])   ? ? ?Past Surgical History:  ?Procedure Laterality Date  ? BIOPSY  01/11/2020  ? Procedure: BIOPSY;  Surgeon: Irene Shipper, MD;  Location: [facility] ENDOSCOPY;  Service: Endoscopy;;  ? COLONOSCOPY WITH PROPOFOL N/A 01/11/2020  ? Procedure: COLONOSCOPY WITH PROPOFOL;  Surgeon: Irene Shipper, MD;  Location: [facility] ENDOSCOPY;  Service: Endoscopy;  Laterality: N/A;  ? NO PAST SURGERIES    ? POLYPECTOMY  01/11/2020  ? Procedure: POLYPECTOMY;  Surgeon: Irene Shipper, MD;  Location: [facility] ENDOSCOPY;  Service: Endoscopy;;  ? SUBMUCOSAL TATTOO INJECTION  01/11/2020  ? Procedure: SUBMUCOSAL TATTOO INJECTION;  Surgeon: Irene Shipper, MD;  Location: [facility] ENDOSCOPY;  Service: Endoscopy;;  ? ? ?Family History  ?Problem Relation Age of Onset  ? Heart failure Brother   ? ? ?Social History  ? ?Socioeconomic History  ? Marital status: Single  ?  Spouse name: Not on file  ? Number of children: Not on file  ? Years of education: Not on file  ? Highest education level: Not on file  ?Occupational History  ? Not on file  ?Tobacco Use  ? Smoking status: Former  ?  Packs/day: 0.50  ?  Types: Cigarettes  ?  Quit date: 10/27/2019  ?  Years  since quitting: 1.7  ? Smokeless tobacco: Never  ?Vaping Use  ? Vaping Use: Never used  ?Substance and Sexual Activity  ? Alcohol use: Yes  ?  Alcohol/week: 0.0 standard drinks  ?  Comment: occ  ? Drug use: No  ? Sexual activity: Not Currently  ?Other Topics Concern  ? Not on file  ?Social History Narrative  ? Not on file  ? ?Social Determinants of Health  ? ?Financial Resource Strain: High Risk  ? Difficulty of Paying Living Expenses: Hard  ?Food Insecurity: Food Insecurity Present  ? Worried About Charity fundraiser in the Last Year: Often true  ? Ran Out of Food in the Last Year: Often  true  ?Transportation Needs: No Transportation Needs  ? Lack of Transportation (Medical): No  ? Lack of Transportation (Non-Medical): No  ?Physical Activity: Inactive  ? Days of Exercise per Week: 0 days  ? Minutes of Exercise per Session: 0 min  ?Stress: Stress Concern Present  ? Feeling of Stress : Rather much  ?Social Connections: Socially Isolated  ? Frequency of Communication with Friends and Family: Twice a week  ? Frequency of Social Gatherings with Friends and Family: Twice a week  ? Attends Religious Services: Never  ? Active Member of Clubs or Organizations: No  ? Attends Archivist Meetings: Never  ? Marital Status: Divorced  ? ? ?No Known Allergies ? ?Outpatient Medications Prior to Visit  ?Medication Sig Dispense Refill  ? allopurinol (ZYLOPRIM) 100 MG tablet Take 0.5 tablets (50 mg total) by mouth every other day. 15 tablet 0  ? amLODipine (NORVASC) 10 MG tablet Take 1 tablet (10 mg total) by mouth daily. 90 tablet 3  ? atorvastatin (LIPITOR) 80 MG tablet Take 1 tablet (80 mg total) by mouth daily. 90 tablet 3  ? Blood Glucose Monitoring Suppl (TRUE METRIX METER) w/Device KIT Check blood sugar fasting and ant bedtime and record 1 kit 0  ? carvedilol (COREG) 25 MG tablet Take 1 tablet (25 mg total) by mouth 2 (two) times daily with a meal. 180 tablet 3  ? Colchicine (MITIGARE) 0.6 MG CAPS Take 1 capsule by mouth as needed. Take 1 tablet (0.6 mg) by mouth at the onset of a gout attack with next dose no sooner than 3 days later 30 capsule 1  ? furosemide (LASIX) 20 MG tablet TAKE 3 TABLETS (60 MG TOTAL) BY MOUTH 2 (TWO) TIMES DAILY. 180 tablet 2  ? glucose blood (TRUE METRIX BLOOD GLUCOSE TEST) test strip Use as instructed 200 each 12  ? hydrALAZINE (APRESOLINE) 100 MG tablet Take 1 tablet (100 mg total) by mouth 3 (three) times daily. 270 tablet 3  ? isosorbide mononitrate (IMDUR) 60 MG 24 hr tablet TAKE 1 TABLET (60 MG TOTAL) BY MOUTH DAILY. 30 tablet 6  ? Misc. Devices (DIGITAL GLASS SCALE)  MISC Use scale to weight yourself. Increasing weight may indicate fluid overload 1 each 0  ? ondansetron (ZOFRAN) 4 MG tablet Take 1 tablet (4 mg total) by mouth every 6 (six) hours. 12 tablet 0  ? oseltamivir (TAMIFLU) 75 MG capsule Take 1 capsule (75 mg total) by mouth every 12 (twelve) hours. 10 capsule 0  ? tamsulosin (FLOMAX) 0.4 MG CAPS capsule Take 1 capsule (0.4 mg total) by mouth daily. 30 capsule 2  ? traMADol (ULTRAM) 50 MG tablet Take 50 mg by mouth every 8 (eight) hours as needed.    ? TRUEplus Lancets 28G MISC Check blood sugar fasting and  at bedtime 210 each 2  ? albuterol (VENTOLIN HFA) 108 (90 Base) MCG/ACT inhaler INHALE 2 PUFFS INTO THE LUNGS EVERY SIX HOURS AS NEEDED FOR WHEEZING OR SHORTNESS OF BREATH. 18 g 1  ? gabapentin (NEURONTIN) 300 MG capsule TAKE 1 CAPSULE (300 MG TOTAL) BY MOUTH 2 (TWO) TIMES DAILY. (Patient taking differently: Take 300 mg by mouth 2 (two) times daily.) 60 capsule 5  ? ?No facility-administered medications prior to visit.  ? ? ? ?ROS ?Review of Systems  ?Constitutional:  Negative for activity change and appetite change.  ?HENT:  Negative for sinus pressure and sore throat.   ?Eyes:  Negative for visual disturbance.  ?Respiratory:  Negative for cough, chest tightness and shortness of breath.   ?Cardiovascular:  Negative for chest pain and leg swelling.  ?Gastrointestinal:  Positive for rectal pain. Negative for abdominal distention, abdominal pain, constipation and diarrhea.  ?Endocrine: Negative.   ?Genitourinary:  Negative for dysuria.  ?Musculoskeletal:  Negative for joint swelling and myalgias.  ?Skin:  Negative for rash.  ?Allergic/Immunologic: Negative.   ?Neurological:  Positive for numbness. Negative for weakness and light-headedness.  ?Psychiatric/Behavioral:  Negative for dysphoric mood and suicidal ideas.   ? ?Objective:  ?BP (!) 222/138   Pulse 73   Ht 6' 1"  (1.854 m)   Wt 226 lb 9.6 oz (102.8 kg)   SpO2 98%   BMI 29.90 kg/m?  ? ? ?  07/24/2021  ?  9:30  AM 06/27/2021  ?  8:55 AM 06/06/2021  ? 11:34 AM  ?BP/Weight  ?Systolic BP 171 278   ?Diastolic BP 718 74   ?Wt. (Lbs) 226.6 226 225.2  ?BMI 29.9 kg/m2 29.82 kg/m2 29.71 kg/m2  ? ? ? ? ?Physical Exam ?[place]

## 2021-07-25 ENCOUNTER — Encounter: Payer: Self-pay | Admitting: Oncology

## 2021-07-25 ENCOUNTER — Other Ambulatory Visit: Payer: Self-pay

## 2021-07-26 ENCOUNTER — Other Ambulatory Visit: Payer: Self-pay

## 2021-07-31 ENCOUNTER — Other Ambulatory Visit: Payer: Self-pay

## 2021-08-01 ENCOUNTER — Other Ambulatory Visit: Payer: Self-pay

## 2021-08-07 ENCOUNTER — Ambulatory Visit: Payer: Medicaid Other | Attending: Oncology | Admitting: Physical Therapy

## 2021-08-07 DIAGNOSIS — R279 Unspecified lack of coordination: Secondary | ICD-10-CM | POA: Insufficient documentation

## 2021-08-07 DIAGNOSIS — M62838 Other muscle spasm: Secondary | ICD-10-CM | POA: Insufficient documentation

## 2021-08-07 DIAGNOSIS — R293 Abnormal posture: Secondary | ICD-10-CM | POA: Diagnosis not present

## 2021-08-07 DIAGNOSIS — M6281 Muscle weakness (generalized): Secondary | ICD-10-CM | POA: Insufficient documentation

## 2021-08-07 NOTE — Patient Instructions (Addendum)
Types of Fiber ? ?There are two main types of fiber:  insoluble and soluble.  Both of these types can prevent and relieve constipation and diarrhea, although some people find one or the other to be more easily digested.  This handout details information about both types of fiber. ? ?Insoluble Fiber ? ?     Functions of Insoluble Fiber ?moves bulk through the intestines  ?controls and balances the pH (acidity) in the intestines ? ?     Benefits of Insoluble Fiber ?promotes regular bowel movement and prevents constipation  ?removes fecal waste through colon in less time  ?keeps an optimal pH in intestines to prevent microbes from producing cancer substances, therefore preventing colon cancer  ? ?     Food Sources of Insoluble Fiber ?whole-wheat products  ?wheat bran ?miller?s bran? ?corn bran  ?flax seed or other seeds ?vegetables such as green beans, broccoli, cauliflower and potato skins  ?fruit skins and root vegetable skins  ?popcorn ?brown rice ? ?Soluble Fiber ? ?     Functions of Soluble Fiber  ?holds water in the colon to bulk and soften the stool ?prolongs stomach emptying time so that sugar is released and absorbed more slowly  ? ?     Benefits of Soluble Fiber ?lowers total cholesterol and LDL cholesterol (the bad cholesterol) therefore reducing the risk of heart disease  ?regulates blood sugar for people with diabetes  ? ?    Food Sources of Soluble Fiber ?oat/oat bran ?dried beans and peas  ?nuts  ?barley  ?flax seed or other seeds ?fruits such as oranges, pears, peaches, and apples  ?vegetables such as carrots  ?psyllium husk  ?prunes  ? ? ?Bowel Control and Holding On ?When bowel control is decreased or lost it is helpful to understand some control techniques. Here are some tips for controlling your bowel movements. ? ?Choose the best time of day to have a bowel movement: ? ?Usually the best time of day for a bowel movement will be a half hour to an hour after breakfast.  For some people, a half hour to  an hour after lunch will work better.  These times are best because the body uses the gastrocolic reflex, a stimulation of bowel motion that occurs with eating, to help produce a bowel movement. ? ?Make sure that you are not rushed and have convenient access to a bathroom at your selected time. ? ?Eat all your meals at a predictable time each day.  The bowel functions best when food is introduced at the same regular intervals. ? ?The amount of food eaten at a given time of day should be about the same size from day to day.  The bowel functions best when food is introduced in similar quantities from day to day. ? ?It is fine to have a small breakfast and a large lunch, or vice versa, just be consistent. ? ?Eat two servings of fruit or vegetables and at least one serving of complex carbohydrates (whole grains such as brown rice, bran, whole wheat bread, or oatmeal) at each meal.  ? ?A serving of fruit or vegetables is a half-cup or medium-sized piece of fruit.  A serving of a complex carbohydrate is a half-cup or a slice of bread.  It is often desirable to eat more than the recommended minimum amounts of fruits, vegetables, and complex carbohydrates. ? ?Drink plenty of water--ideally eight glasses a day. ? ?Until regular bowel movements are established at a desired time of day,  take 2-3 dried prunes (or ? to 1/3 cup of prune juice) each night to stimulate morning bowel function. ? ?Exercise daily.  You may exercise at any time of day, but you may find that bowel function is helped most if the exercise is at a consistent time each day. ? ? ? ? ? ?  ?

## 2021-08-07 NOTE — Therapy (Signed)
?OUTPATIENT PHYSICAL THERAPY TREATMENT NOTE ? ? ?Patient Name: Craig West ?MRN: [id number] ?DOB:July 17, 1965, 56 y.o., male ?Today's Date: 08/07/2021 ? ?PCP: Charlott Rakes, MD ?REFERRING PROVIDER: Ladell Pier, MD ? ? PT End of Session - 08/07/21 0801   ? ? Visit Number 4   ? Date for PT Re-Evaluation 09/12/21   ? Authorization Type healthy blue   ? PT Start Time 0801   ? PT Stop Time 0834   ? PT Time Calculation (min) 33 min   ? Activity Tolerance Patient tolerated treatment well;Other (comment)   pt reports he wasn't feeling well after several BMs throughout the night and having multiple instances of fecal intcontinence  ? Behavior During Therapy [facility] for tasks assessed/performed   ? ?  ?  ? ?  ? ? ? ? ?Past Medical History:  ?Diagnosis Date  ? Asthma   ? CHF (congestive heart failure) ([place])   ? CKD (chronic kidney disease), stage IV ([place])   ? COVID-19 virus infection 04/2019  ? Diabetes mellitus without complication ([place])   ? Fatigue 11/23/2020  ? Gout   ? Hypertension   ? Leg heaviness 11/23/2020  ? NSVT (nonsustained ventricular tachycardia) ([place]) 11/23/2020  ? Rectal cancer ([place])   ? ?Past Surgical History:  ?Procedure Laterality Date  ? BIOPSY  01/11/2020  ? Procedure: BIOPSY;  Surgeon: Irene Shipper, MD;  Location: [facility] ENDOSCOPY;  Service: Endoscopy;;  ? COLONOSCOPY WITH PROPOFOL N/A 01/11/2020  ? Procedure: COLONOSCOPY WITH PROPOFOL;  Surgeon: Irene Shipper, MD;  Location: [facility] ENDOSCOPY;  Service: Endoscopy;  Laterality: N/A;  ? NO PAST SURGERIES    ? POLYPECTOMY  01/11/2020  ? Procedure: POLYPECTOMY;  Surgeon: Irene Shipper, MD;  Location: [facility] ENDOSCOPY;  Service: Endoscopy;;  ? SUBMUCOSAL TATTOO INJECTION  01/11/2020  ? Procedure: SUBMUCOSAL TATTOO INJECTION;  Surgeon: Irene Shipper, MD;  Location: [facility] ENDOSCOPY;  Service: Endoscopy;;  ? ?Patient Active Problem List  ? Diagnosis Date Noted  ? BPH (benign prostatic hyperplasia) 03/27/2021  ? Fatigue 11/23/2020  ? Leg heaviness 11/23/2020  ? NSVT (nonsustained  ventricular tachycardia) ([place]) 11/23/2020  ? Diabetes mellitus without complication ([place])   ? CKD (chronic kidney disease), stage IV ([place])   ? PICC (peripherally inserted central catheter) in place 03/27/2020  ? Rectal cancer ([place]) 01/27/2020  ? Benign neoplasm of ascending colon   ? Benign neoplasm of descending colon   ? Benign neoplasm of sigmoid colon   ? Rectal mass   ? Rectal bleeding   ? Iron deficiency anemia   ? Chronic diastolic heart failure ([place])   ? Hyperkalemia   ? Volume overload 01/06/2020  ? Demand ischemia ([place])   ? COVID-19 virus infection 04/2019  ? Obesity (BMI 30.0-34.9) 12/09/2017  ? Non compliance w medication regimen 04/18/2017  ? Gout 08/02/2016  ? Diabetic neuropathy ([place]) 08/02/2016  ? Hyperlipidemia 10/24/2015  ? Asthma 10/16/2015  ? Type 2 diabetes mellitus without complication ([place]) 60/12/9321  ? Essential hypertension 06/13/2014  ? Smoker 06/13/2014  ? ? ?REFERRING DIAG: C20 (ICD-10-CM) - Rectal cancer ([place]) ? ?THERAPY DIAG:  ?Muscle weakness (generalized) ? ?Abnormal posture ? ?Unspecified lack of coordination ? ?PERTINENT HISTORY:  ?Per chart review: ?Rectal cancer-rectal mass at 5-6 cm from the anal verge on colonoscopy 01/11/2020, biopsy confirmed adenocarcinoma ?MRI pelvis 01/27/2020-[id number] tumor at 7.1 cm from the internal anal sphincter, multiple mesorectal nodes, indistinct left pelvic sidewall node and enlarged bilateral external iliac nodes ?PET scan 02/11/2020-hypermetabolic rectosigmoid mass, hypermetabolic mesorectal and  sigmoid mesocolon lymph nodes, left external iliac/obturator node with mild hypermetabolism, 18 mm left inguinal node with normal architecture and slight hypermetabolism ?Radiation and infusional 5-FU 02/21/2020-04/04/2020 ?CTs 05/09/2020- no evidence of disease progression ?MRI pelvis 05/17/2020-no well-defined residual rectal mass seen.  Wall appears slightly irregular.  No perirectal extension of tumor.  Again identified is perirectal adenopathy, similar to  most recent CT 05/10/2020 but improved compared to PET/CT of 02/11/2020. ?Cycle 1 FOLFOX 06/14/2020 ?06/27/2020 chemotherapy held due to hypertension ?Cycle 2 FOLFOX 06/29/2020 ?Cycle 3 FOLFOX 07/13/2020 ?Cycle 4 FOLFOX 07/27/2020 ?Cycle 5 FOLFOX 08/09/2020 ?Cycle 6 FOLFOX 08/24/2020 ?Cycle 7 FOLFOX 09/07/2020 ?Cycle 8 FOLFOX 09/21/2020 ?MRI pelvis 10/16/2020-no residual rectal mass, small presacral/perirectal nodes improved from previous MRI ?12/06/2020 open low anterior resection with diverting loop colostomy, Dr. Hester Mates; pathology-rectal tumor location entirely below anterior peritoneal reflection; adenocarcinoma; minimal residual tumor; invades submucosa; no macroscopic tumor perforation; no lymphovascular invasion; no perineural invasion; treatment effect present with single cells or rare small groups of cancer cells (near complete response, score 1); all margins negative for invasive carcinoma; 2 of 13 lymph nodes positive; pT1, pN1b ?04/04/2021 transverse loop colostomy takedown ?CT chest 05/04/2021 (CT abdomen/pelvis October 2022)-no evidence of metastatic disease ?Anemia ?Chronic renal failure ?Congestive heart failure-severe LVH ?Diabetes ?Gout ?Peripheral neuropathy ?Hypertension ?Fever/chills 01/03/2021-CT with air-fluid collection in the presacral space extending from the level of the anus to the level of S2 worrisome for abscess or contained perforation.  Transferred to [place].  Drain placed.  Culture positive for E. coli.  Drain removed 01/07/2021.  Repeat CT 01/08/2021 showed near resolution of the fluid component of presacral collection with persistent thick soft tissue rind not amenable to drainage.  Drain was not replaced. ?Sexual abuse: No ? ?PRECAUTIONS: post rectal CA treatments, ostomy reversal 04/2021 ? ?SUBJECTIVE: Pt reports he continues to have high frequency BMs, but reports he is only eating one meal per day and it's usually a large meal. Pt reports he usually has multiple fecal incontinence episodes, does not  wear depends but reports he is thinking about starting to get some.  ? ?PAIN:  ?Are you having pain? No ? ? ? ? ? ?OBJECTIVE:  ?  ?DIAGNOSTIC FINDINGS:  ?PATIENT SURVEYS: ?  ?PFIQ-7 100 ? ?  ?COGNITION: ?           Overall cognitive status: Within functional limits for tasks assessed              ?            ?SENSATION: ?           Light touch: Appears intact ?           Proprioception: Appears intact ?  ?MUSCLE LENGTH: ?Bil hamstrings and adductors limited by 50% and HIP IR by 25% ?  ?  ?POSTURE:  ?Rounded shoulders, posterior pelvic tilt ?  ?PALPATION: ?Internal Pelvic Floor deferred at pt request ?  ?External Perineal Exam deferred pt at request ?  ?GENERAL all abdominal quadrants noted to have fascial restrictions in all directions, decreased mobility at scar site from ostomy reversal (04/2021). No TTP throughout palpations ?  ?LUMBARAROM/PROM ?  ?Lumbar mobility decreased by 50% in side bending and extension; 75% in flexion and bil rotation ?  ?LE AROM/PROM: ?  ?Bil LE WFL throughout ?  ?LE MMT: ?  ?Bil hip abduction 3/5 all others 4/5 ?  ?PELVIC MMT: ?  ?MMT   ?06/12/2021 06/28/21  ?Internal Anal Sphincter Deferred at pt request 3/5; isometrics  4s; 6 reps  ?External Anal Sphincter   2/5  ?Puborectalis   3/5  ?Diastasis Recti     ?(Blank rows = not tested) ?  ?  ?TONE: ?Deferred all internal assessment or external visualization at this time ?  ?06/28/2021 : consented  this date - mildly decreased ?  ?  ?  ?TODAY'S TREATMENT  ? ?08/07/2021 : ?Pt educated on voiding and breathing mechanics for improved bowel habits, pt given handouts on fiber types again and given bowel control handout. Pt also encouraged to reach out to medical team for their recommendations on meal plans and fiber intake. Pt agreed. Pt completed simulated voiding mechanics and breathing 3 reps with extra time spent to educate pt on importance of relaxation while voiding, balloon breathing instead of straining, and continued HEP at home. Pt denied  additional treatment after this with stretching or pelvic floor strengthening as he is still not feeling well.  ? ?07/24/2021: ? Educated on continued HEP as pt reports he hasn't really been doing this a

## 2021-08-08 ENCOUNTER — Other Ambulatory Visit: Payer: Self-pay

## 2021-08-08 ENCOUNTER — Encounter: Payer: Self-pay | Admitting: Oncology

## 2021-08-15 ENCOUNTER — Other Ambulatory Visit: Payer: Self-pay

## 2021-08-21 ENCOUNTER — Ambulatory Visit: Payer: Medicaid Other | Admitting: Physical Therapy

## 2021-08-21 DIAGNOSIS — M62838 Other muscle spasm: Secondary | ICD-10-CM | POA: Diagnosis not present

## 2021-08-21 DIAGNOSIS — M6281 Muscle weakness (generalized): Secondary | ICD-10-CM

## 2021-08-21 DIAGNOSIS — R279 Unspecified lack of coordination: Secondary | ICD-10-CM | POA: Diagnosis not present

## 2021-08-21 DIAGNOSIS — R293 Abnormal posture: Secondary | ICD-10-CM | POA: Diagnosis not present

## 2021-08-21 NOTE — Therapy (Signed)
OUTPATIENT PHYSICAL THERAPY TREATMENT NOTE   Patient Name: Craig West MRN: [id number] DOB:Aug 29, 1965, 56 y.o., male Today's Date: 08/21/2021  PCP: Charlott Rakes, MD REFERRING PROVIDER: Charlott Rakes, MD   PT End of Session - 08/21/21 0754     Visit Number 5    Date for PT Re-Evaluation 09/12/21    Authorization Type healthy blue    Authorization - Visit Number 5    Authorization - Number of Visits 8    PT Start Time 0800    PT Stop Time 0833   PT REQUESTED TO END EARLY   PT Time Calculation (min) 33 min    Activity Tolerance Patient tolerated treatment well;Other (comment)   pt reports he wasn't feeling well after several BMs throughout the night and having multiple instances of fecal intcontinence   Behavior During Therapy [facility] for tasks assessed/performed                Past Medical History:  Diagnosis Date   Asthma    CHF (congestive heart failure) ([place])    CKD (chronic kidney disease), stage IV ([place])    COVID-19 virus infection 04/2019   Diabetes mellitus without complication (HCC)    Fatigue 11/23/2020   Gout    Hypertension    Leg heaviness 11/23/2020   NSVT (nonsustained ventricular tachycardia) ([place]) 11/23/2020   Rectal cancer [facility])    Past Surgical History:  Procedure Laterality Date   BIOPSY  01/11/2020   Procedure: BIOPSY;  Surgeon: Irene Shipper, MD;  Location: [place];  Service: Endoscopy;;   COLONOSCOPY WITH PROPOFOL N/A 01/11/2020   Procedure: COLONOSCOPY WITH PROPOFOL;  Surgeon: Irene Shipper, MD;  Location: [place];  Service: Endoscopy;  Laterality: N/A;   NO PAST SURGERIES     POLYPECTOMY  01/11/2020   Procedure: POLYPECTOMY;  Surgeon: Irene Shipper, MD;  Location: [facility] ENDOSCOPY;  Service: Endoscopy;;   SUBMUCOSAL TATTOO INJECTION  01/11/2020   Procedure: SUBMUCOSAL TATTOO INJECTION;  Surgeon: Irene Shipper, MD;  Location: [facility] ENDOSCOPY;  Service: Endoscopy;;   Patient Active Problem List   Diagnosis Date Noted   BPH (benign  prostatic hyperplasia) 03/27/2021   Fatigue 11/23/2020   Leg heaviness 11/23/2020   NSVT (nonsustained ventricular tachycardia) ([place]) 11/23/2020   Diabetes mellitus without complication ([place])    CKD (chronic kidney disease), stage IV ([place])    PICC (peripherally inserted central catheter) in place 03/27/2020   Rectal cancer ([place]) 01/27/2020   Benign neoplasm of ascending colon    Benign neoplasm of descending colon    Benign neoplasm of sigmoid colon    Rectal mass    Rectal bleeding    Iron deficiency anemia    Chronic diastolic heart failure (HCC)    Hyperkalemia    Volume overload 01/06/2020   Demand ischemia ([place])    COVID-19 virus infection 04/2019   Obesity (BMI 30.0-34.9) 12/09/2017   Non compliance w medication regimen 04/18/2017   Gout 08/02/2016   Diabetic neuropathy ([place]) 08/02/2016   Hyperlipidemia 10/24/2015   Asthma 10/16/2015   Type 2 diabetes mellitus without complication ([place]) 88/89/1694   Essential hypertension 06/13/2014   Smoker 06/13/2014    REFERRING DIAG: C20 (ICD-10-CM) - Rectal cancer ([place])  THERAPY DIAG:  Muscle weakness (generalized)  Unspecified lack of coordination  Other muscle spasm  PERTINENT HISTORY:  Per chart review: Rectal cancer-rectal mass at 5-6 cm from the anal verge on colonoscopy 01/11/2020, biopsy confirmed adenocarcinoma MRI pelvis 01/27/2020-T3bN2b tumor at 7.1 cm from the internal  anal sphincter, multiple mesorectal nodes, indistinct left pelvic sidewall node and enlarged bilateral external iliac nodes PET scan 02/11/2020-hypermetabolic rectosigmoid mass, hypermetabolic mesorectal and sigmoid mesocolon lymph nodes, left external iliac/obturator node with mild hypermetabolism, 18 mm left inguinal node with normal architecture and slight hypermetabolism Radiation and infusional 5-FU 02/21/2020-04/04/2020 CTs 05/09/2020- no evidence of disease progression MRI pelvis 05/17/2020-no well-defined residual rectal mass seen.  Wall appears  slightly irregular.  No perirectal extension of tumor.  Again identified is perirectal adenopathy, similar to most recent CT 05/10/2020 but improved compared to PET/CT of 02/11/2020. Cycle 1 FOLFOX 06/14/2020 06/27/2020 chemotherapy held due to hypertension Cycle 2 FOLFOX 06/29/2020 Cycle 3 FOLFOX 07/13/2020 Cycle 4 FOLFOX 07/27/2020 Cycle 5 FOLFOX 08/09/2020 Cycle 6 FOLFOX 08/24/2020 Cycle 7 FOLFOX 09/07/2020 Cycle 8 FOLFOX 09/21/2020 MRI pelvis 10/16/2020-no residual rectal mass, small presacral/perirectal nodes improved from previous MRI 12/06/2020 open low anterior resection with diverting loop colostomy, Dr. Hester Mates; pathology-rectal tumor location entirely below anterior peritoneal reflection; adenocarcinoma; minimal residual tumor; invades submucosa; no macroscopic tumor perforation; no lymphovascular invasion; no perineural invasion; treatment effect present with single cells or rare small groups of cancer cells (near complete response, score 1); all margins negative for invasive carcinoma; 2 of 13 lymph nodes positive; pT1, pN1b 04/04/2021 transverse loop colostomy takedown CT chest 05/04/2021 (CT abdomen/pelvis October 2022)-no evidence of metastatic disease Anemia Chronic renal failure Congestive heart failure-severe LVH Diabetes Gout Peripheral neuropathy Hypertension Fever/chills 01/03/2021-CT with air-fluid collection in the presacral space extending from the level of the anus to the level of S2 worrisome for abscess or contained perforation.  Transferred to [place].  Drain placed.  Culture positive for E. coli.  Drain removed 01/07/2021.  Repeat CT 01/08/2021 showed near resolution of the fluid component of presacral collection with persistent thick soft tissue rind not amenable to drainage.  Drain was not replaced. Sexual abuse: No  PRECAUTIONS: post rectal CA treatments, ostomy reversal 04/2021  SUBJECTIVE: Pt reports he is better, now having less leakage instances and less straining. Pt reports he  is having about 50% less BMs per day and 50% less leakages per day/night compared to prior to PT. Pt reports he has been much better with voiding and almost no straining but does always feel like it is incomplete. Pt reports he has not attempted to change diet but reports he does know this has been brought up to him, pt reports he has lowered water intake again as he   PAIN:  Are you having pain? No      OBJECTIVE:    DIAGNOSTIC FINDINGS:  PATIENT SURVEYS:   PFIQ-7 100    COGNITION:            Overall cognitive status: Within functional limits for tasks assessed                          SENSATION:            Light touch: Appears intact            Proprioception: Appears intact   MUSCLE LENGTH: Bil hamstrings and adductors limited by 50% and HIP IR by 25%     POSTURE:  Rounded shoulders, posterior pelvic tilt   PALPATION: Internal Pelvic Floor deferred at pt request   External Perineal Exam deferred pt at request   GENERAL all abdominal quadrants noted to have fascial restrictions in all directions, decreased mobility at scar site from ostomy reversal (04/2021). No TTP throughout palpations  LUMBARAROM/PROM   Lumbar mobility decreased by 50% in side bending and extension; 75% in flexion and bil rotation   LE AROM/PROM:   Bil LE WFL throughout   LE MMT:   Bil hip abduction 3/5 all others 4/5   PELVIC MMT:   MMT   06/12/2021 06/28/21  Internal Anal Sphincter Deferred at pt request 3/5; isometrics 4s; 6 reps  External Anal Sphincter   2/5  Puborectalis   3/5  Diastasis Recti     (Blank rows = not tested)     TONE: Deferred all internal assessment or external visualization at this time   06/28/2021 : consented  this date - mildly decreased       TODAY'S TREATMENT   08/21/2021:  HEP updated to include hip and core strengthening exercises, educated on these and session used to completed these for carry over for home.  2x10 Sit to stand from chair with  pelvic floor contraction and breathing mechanics cued.  2x10 bridge with ball squeeze with pelvic floor contraction and breathing mechanics cued.  2x10 opp hand/knee press in hooklying with pelvic floor contraction and breathing mechanics cued.  X10 mario punches 4# min A for balance  X10 alternating cone taps in standing, min A for balance  08/07/2021 : Pt educated on voiding and breathing mechanics for improved bowel habits, pt given handouts on fiber types again and given bowel control handout. Pt also encouraged to reach out to medical team for their recommendations on meal plans and fiber intake. Pt agreed. Pt completed simulated voiding mechanics and breathing 3 reps with extra time spent to educate pt on importance of relaxation while voiding, balloon breathing instead of straining, and continued HEP at home. Pt denied additional treatment after this with stretching or pelvic floor strengthening as he is still not feeling well.   07/24/2021:  Educated on continued HEP as pt reports he hasn't really been doing this a lot, and re-educated on abdominal massage as he reports he hasn't tried this much.  X10 lower trunk rotations in hooklying 3x30s butterfly stretch 2x45s foam roller for thoracic extension X10 diaphragmatic breathing in hooklying    06/28/2021:   Education on fiber types and voiding mechanics in more detail and breathing mechanics with balloon breathing technique for improved mechanics for decreased frequency, straining, and pain with BMs.   Pt consented to internal pelvic floor assessment rectally this session. Pt found to have weakness at EAS, IAS, and puborectalis details above. Pt also demonstrated deficits in coordination as when he attempted to bulge global tightening was demonstrated and abdominal bulge. Pt educated on this and cued to attempt balloon breathing technique then with this able to relax EAS and demonstrate improved mechanics and bulge. X10 contractions with balloon  breathing technique for improved coordination of pelvic floor and muscle relaxation.  HEP updated to include pelvic floor strengthening and isometric holds. Pt verbalized understanding to all education and to attempt to improve mechanics and implement these and voiding mechanics before next session.    EVAL 06/12/2021 exam completed, findings discussed with pt, educated on POC, HEP, breathing and voiding mechanics, fiber types and abdominal massage.      PATIENT EDUCATION:  Education details: HEP updated  Person educated: Patient Education method: Explanation, Demonstration, Tactile cues, Verbal cues, and Handouts Education comprehension: verbalized understanding and returned demonstration     HOME EXERCISE PROGRAM: [id number]   ASSESSMENT:   CLINICAL IMPRESSION: Patient presents to clinic reporting he has had 50% reduction in incontinence symptoms,  bowel frequency, and no longer needing to strain to start or finish a bowel movement. Pt session focused on breathing and pelvic floor mechanics and coordination with strengthening exercises as pt reports he does have leakage with lifting something. Pt required verbal cues for coordination and technique throughout, min A for standing exercises due to minimal LOB but pt able to recover without needing to sit down. Pt demonstrated limitation due to decreased tolerance to activity and needs multiple rest breaks. Pt requested to end session early due to fatigue, pt educated to attempt HEP at home as able and pt reports he is active at his job and thinks that should be enough. PT educated pt on importance of hip and core strength and pelvic floor strength to improve symptoms further and pt reported he would try to these more.  Pt would benefit from additional PT to further address deficits.         OBJECTIVE IMPAIRMENTS decreased coordination, decreased endurance, decreased strength, increased fascial restrictions, increased muscle spasms, impaired  flexibility, impaired tone, improper body mechanics, postural dysfunction, and pain.    ACTIVITY LIMITATIONS cleaning, community activity, driving, occupation, yard work, and shopping.    PERSONAL FACTORS Fitness, Past/current experiences, Time since onset of injury/illness/exacerbation, and 1 comorbidity: rectal CA  are also affecting patient's functional outcome.      REHAB POTENTIAL: Good   CLINICAL DECISION MAKING: Stable/uncomplicated   EVALUATION COMPLEXITY: Low     GOALS: Goals reviewed with patient? Yes   SHORT TERM GOALS: Target date: 07/10/2021   Pt to be I with HEP. Baseline: given at eval Goal status: MET   2.  Pt to demonstrate improved recall of voiding mechanics, breathing mechanics, without cues for improved bowel habits at home Baseline: educated at eval Goal status: MET   3.  Pt to report improved bowel movement frequency to no more than 7 per day for improved QOL and regularity Baseline: 12-13 Goal status: ON GOING   4.  Pt to demonstrate at least 3/5 pelvic floor strength to improve ability to without fecal incontinence and improve QOL.  Baseline: deferred testing at eval Goal status: MET   5.  Pt to report no more than 3 fecal incontinence episodes per day for improved QOL and skin integrity  Baseline: several Goal status: ON GOING   LONG TERM GOALS: Target date:  09/12/21   Pt to be I with advanced HEP. Baseline: given at eval Goal status: ON GOING   2.  Pt to report improved bowel movement frequency to no more than 3 per day for improved QOL and regularity Baseline: 12-13 Goal status: ON GOING   3.  Pt to demonstrate at least 4/5 pelvic floor strength to improve ability to without fecal incontinence and improve QOL.  Baseline: deferred testing at eval Goal status: ON GOING   4. Pt to report no more than 1 fecal incontinence episodes per day for improved QOL and skin integrity  Baseline: several Goal status: ON GOING   5.  Pt to demonstrate  improved hip strengthening to grossly 4+/5  for improved pelvic stability  Baseline: Bil hip abduction 3/5 all others 4/5 Goal status: ON GOING         PLAN: PT FREQUENCY:  every other week   PT DURATION:  8 sessions   PLANNED INTERVENTIONS: Therapeutic exercises, Therapeutic activity, Neuromuscular re-education, Patient/Family education, Joint mobilization, Spinal mobilization, Manual lymph drainage, scar mobilization, and Manual therapy   PLAN FOR NEXT SESSION: stretching, hip and core  strengthening    Stacy Gardner, PT, DPT 05/23/238:34 AM

## 2021-08-23 ENCOUNTER — Other Ambulatory Visit: Payer: Self-pay

## 2021-08-23 ENCOUNTER — Encounter: Payer: Self-pay | Admitting: Pharmacist

## 2021-08-23 ENCOUNTER — Ambulatory Visit: Payer: Medicaid Other | Attending: Family Medicine | Admitting: Pharmacist

## 2021-08-23 VITALS — BP 97/60 | HR 64

## 2021-08-23 DIAGNOSIS — E1122 Type 2 diabetes mellitus with diabetic chronic kidney disease: Secondary | ICD-10-CM | POA: Diagnosis not present

## 2021-08-23 DIAGNOSIS — I129 Hypertensive chronic kidney disease with stage 1 through stage 4 chronic kidney disease, or unspecified chronic kidney disease: Secondary | ICD-10-CM | POA: Diagnosis not present

## 2021-08-23 MED ORDER — HYDRALAZINE HCL 50 MG PO TABS
50.0000 mg | ORAL_TABLET | Freq: Two times a day (BID) | ORAL | 2 refills | Status: DC
  Filled 2021-08-23: qty 60, 30d supply, fill #0

## 2021-08-23 NOTE — Progress Notes (Signed)
S:    PCP: Dr. Margarita Rana   No chief complaint on file.  Craig West is a 56 y.o. male who presents for hypertension evaluation, education, and management. PMH is significant for hypertension, type 2 diabetes mellitus (A1c 5.9), asthma, gout, stage IV chronic kidney disease, Hypertensive Heart Disease, Rectal carcinoma diagnosed in 12/2018 (completed chemotherapy and radiation), s/p Low anterior resection with diverting Colostomy at [facility] on 12/06/20 with subsequent reversal of colostomy. Patient was referred and last seen by Primary Care Provider, Dr. Margarita Rana, on 07/24/2021. BP at that visit was 222/138 mmHg. Pt had not taken his BP medications prior to his appt that day.   Today, patient arrives in poor spirits and presents without assistance. His feels very fatigued this morning. I note that his gait, while unassisted, is unsteady. Endorses daily dizziness ~30 minutes after taking his morning antihypertensives. Denies headache, blurred vision, swelling. He tells me his dizziness prohibits him from taking his 2nd and 3rd daily doses of hydralazine. He also does not take carvedilol in the evening. He takes amlodipine 10 mg daily. Denies any chest pains or shortness of breath.   Family/Social history:  Fhx: heart failure, HTN Tobacco: former smoker (quit 2021) Alcohol: denies use   Medication adherence: admits to missing 2-3 days (all doses of all antihypertensives) during the week due to side effects. Patient has taken BP medications today.  Current antihypertensives include: amlodipine 10 mg daily, carvedilol 25 mg BID, hydralazine 100 mg TID **Of note, he takes furosemide for fluid control as needed   When he does take his medication, he only takes amlodipine, 100 mg of hydralazine, and 25 mg of carvedilol in the morning. He does not take anything for the rest of the day. Upon further review, patient tells me he takes his morning medications on an empty stomach.   Antihypertensives tried in the  past include: HCTZ, lisinopril, combination lisinopril-HCTZ  Reported home BP readings:  -No numbers given    Patient reported dietary habits:  -Adherent to salt restriction. Admits to occasional take out or fast food  -Denies excessive intake of caffeine   Patient-reported exercise habits: "rarely" exercises   O:  Vitals:   08/23/21 0912  BP: 97/60  Pulse: 64    Last 3 Office BP readings: BP Readings from Last 3 Encounters:  07/24/21 (!) 222/138  06/27/21 125/74  05/29/21 111/67    BMET    Component Value Date/Time   NA 139 06/27/2021 0927   K 4.4 06/27/2021 0927   CL 105 06/27/2021 0927   CO2 23 03/01/2021 1135   GLUCOSE 156 (H) 06/27/2021 0927   GLUCOSE 95 03/01/2021 1135   BUN 43 (H) 06/27/2021 0927   CREATININE 2.90 (H) 06/27/2021 0927   CREATININE 3.15 (HH) 02/02/2021 0822   CREATININE 1.18 10/23/2015 1009   CALCIUM 8.5 (L) 06/27/2021 0927   CALCIUM 7.8 (L) 01/08/2020 0307   GFRNONAA 24 (L) 03/01/2021 1135   GFRNONAA 22 (L) 02/02/2021 0822   GFRNONAA 71 10/23/2015 1009   GFRAA 15 (L) 01/03/2020 1113   GFRAA 82 10/23/2015 1009    Renal function: CrCl cannot be calculated (Patient's most recent lab result is older than the maximum 21 days allowed.).  Clinical ASCVD: No  The ASCVD Risk score (Arnett DK, et al., 2019) failed to calculate for the following reasons:   The valid systolic blood pressure range is 90 to 200 mmHg  A/P: Hypertension longstanding currently at goal, borderline hypotensive in clinic on current medications. BP goal <  130/80 mmHg. Medication adherence appears suboptimal d/t side effects.  We had a long discussion. He is only getting 100 mg daily of hydralazine in the morning and 25 mg of carvedilol in the morning. His heart rate is not bradycardic today, but I have advised him that part of his symptoms are coming from rapid absorption of the carvedilol on an empty stomach. He is amenable to trying carvedilol 25 mg BID along with  hydralazine 50 mg BID. We will stop isosorbide as instructed by his PCP and continue amlodipine 10 mg daily.   With his poor adherence, it is likely that his BP is very labile and contributory to his symptoms. I emphasized that if we can keep BP more stable within a goal range, his body will adjust and make side effects less possible.   - Changed  hydralazine to 50 mg BID. -Start taking carvedilol 25 mg BID.  -Continue amlodipine 10 mg daily.  -Stop isosorbide.   -Counseled on lifestyle modifications for blood pressure control including reduced dietary sodium, increased exercise, adequate sleep. -Encouraged patient to check BP at home and bring log of readings to next visit. Counseled on proper use of home BP cuff.   Results reviewed and written information provided. Patient verbalized understanding of treatment plan. Total time in face-to-face counseling 30 minutes.   F/u clinic visit in 1 month.  Benard Halsted, PharmD, [place], [place] [phone]

## 2021-08-30 ENCOUNTER — Other Ambulatory Visit: Payer: Self-pay

## 2021-09-04 ENCOUNTER — Telehealth: Payer: Self-pay | Admitting: Physical Therapy

## 2021-09-04 ENCOUNTER — Ambulatory Visit: Payer: Medicaid Other | Attending: Oncology | Admitting: Physical Therapy

## 2021-09-04 DIAGNOSIS — R279 Unspecified lack of coordination: Secondary | ICD-10-CM | POA: Insufficient documentation

## 2021-09-04 DIAGNOSIS — M62838 Other muscle spasm: Secondary | ICD-10-CM | POA: Insufficient documentation

## 2021-09-04 DIAGNOSIS — R293 Abnormal posture: Secondary | ICD-10-CM | POA: Insufficient documentation

## 2021-09-04 DIAGNOSIS — M6281 Muscle weakness (generalized): Secondary | ICD-10-CM | POA: Insufficient documentation

## 2021-09-04 NOTE — Telephone Encounter (Signed)
PT called pt about this morning's appointment at 0800. Pt reported he wasn't able to make it this week but will be here for next appointment.   Stacy Gardner, PT, DPT 09/04/2308:44 AM

## 2021-09-18 ENCOUNTER — Ambulatory Visit: Payer: Medicaid Other | Admitting: Physical Therapy

## 2021-09-18 DIAGNOSIS — R279 Unspecified lack of coordination: Secondary | ICD-10-CM | POA: Diagnosis not present

## 2021-09-18 DIAGNOSIS — M62838 Other muscle spasm: Secondary | ICD-10-CM

## 2021-09-18 DIAGNOSIS — M6281 Muscle weakness (generalized): Secondary | ICD-10-CM

## 2021-09-18 DIAGNOSIS — R293 Abnormal posture: Secondary | ICD-10-CM | POA: Diagnosis not present

## 2021-09-18 NOTE — Therapy (Addendum)
OUTPATIENT PHYSICAL THERAPY TREATMENT NOTE   Patient Name: Craig West MRN: [id number] DOB:Dec 27, 1965, 56 y.o., male Today's Date: 09/18/2021  PCP: Charlott Rakes, MD REFERRING PROVIDER: Ladell Pier, MD   PT End of Session - 09/18/21 0755     Visit Number 6    Date for PT Re-Evaluation 11/12/21    Authorization Type healthy blue    Authorization - Visit Number 6    Authorization - Number of Visits 10   HB approved 5 more visits at visit # 5 (total of 10)   PT Start Time 0800    PT Stop Time 0841    PT Time Calculation (min) 41 min    Activity Tolerance Patient tolerated treatment well   pt reports he wasn't feeling well after several BMs throughout the night and having multiple instances of fecal intcontinence   Behavior During Therapy [facility] for tasks assessed/performed                Past Medical History:  Diagnosis Date   Asthma    CHF (congestive heart failure) ([place])    CKD (chronic kidney disease), stage IV ([place])    COVID-19 virus infection 04/2019   Diabetes mellitus without complication ([place])    Fatigue 11/23/2020   Gout    Hypertension    Leg heaviness 11/23/2020   NSVT (nonsustained ventricular tachycardia) ([place]) 11/23/2020   Rectal cancer [facility])    Past Surgical History:  Procedure Laterality Date   BIOPSY  01/11/2020   Procedure: BIOPSY;  Surgeon: Irene Shipper, MD;  Location: [facility];  Service: Endoscopy;;   COLONOSCOPY WITH PROPOFOL N/A 01/11/2020   Procedure: COLONOSCOPY WITH PROPOFOL;  Surgeon: Irene Shipper, MD;  Location: [facility] ENDOSCOPY;  Service: Endoscopy;  Laterality: N/A;   NO PAST SURGERIES     POLYPECTOMY  01/11/2020   Procedure: POLYPECTOMY;  Surgeon: Irene Shipper, MD;  Location: [facility] ENDOSCOPY;  Service: Endoscopy;;   SUBMUCOSAL TATTOO INJECTION  01/11/2020   Procedure: SUBMUCOSAL TATTOO INJECTION;  Surgeon: Irene Shipper, MD;  Location: [facility] ENDOSCOPY;  Service: Endoscopy;;   Patient Active Problem List   Diagnosis Date Noted   BPH  (benign prostatic hyperplasia) 03/27/2021   Fatigue 11/23/2020   Leg heaviness 11/23/2020   NSVT (nonsustained ventricular tachycardia) ([place]) 11/23/2020   Diabetes mellitus without complication ([place])    CKD (chronic kidney disease), stage IV ([place])    PICC (peripherally inserted central catheter) in place 03/27/2020   Rectal cancer ([place]) 01/27/2020   Benign neoplasm of ascending colon    Benign neoplasm of descending colon    Benign neoplasm of sigmoid colon    Rectal mass    Rectal bleeding    Iron deficiency anemia    Chronic diastolic heart failure (H[facility])    Hyperkalemia    Volume overload 01/06/2020   Demand ischemia ([place])    COVID-19 virus infection 04/2019   Obesity (BMI 30.0-34.9) 12/09/2017   Non compliance w medication regimen 04/18/2017   Gout 08/02/2016   Diabetic neuropathy ([place]) 08/02/2016   Hyperlipidemia 10/24/2015   Asthma 10/16/2015   Type 2 diabetes mellitus without complication ([place]) 21/22/4825   Essential hypertension 06/13/2014   Smoker 06/13/2014    REFERRING DIAG: C20 (ICD-10-CM) - Rectal cancer ([place])  THERAPY DIAG:  Muscle weakness (generalized)  Abnormal posture  Unspecified lack of coordination  Other muscle spasm  PERTINENT HISTORY:  Per chart review: Rectal cancer-rectal mass at 5-6 cm from the anal verge on colonoscopy 01/11/2020, biopsy confirmed adenocarcinoma  MRI pelvis 01/27/2020-T3bN2b tumor at 7.1 cm from the internal anal sphincter, multiple mesorectal nodes, indistinct left pelvic sidewall node and enlarged bilateral external iliac nodes PET scan 02/11/2020-hypermetabolic rectosigmoid mass, hypermetabolic mesorectal and sigmoid mesocolon lymph nodes, left external iliac/obturator node with mild hypermetabolism, 18 mm left inguinal node with normal architecture and slight hypermetabolism Radiation and infusional 5-FU 02/21/2020-04/04/2020 CTs 05/09/2020- no evidence of disease progression MRI pelvis 05/17/2020-no well-defined residual  rectal mass seen.  Wall appears slightly irregular.  No perirectal extension of tumor.  Again identified is perirectal adenopathy, similar to most recent CT 05/10/2020 but improved compared to PET/CT of 02/11/2020. Cycle 1 FOLFOX 06/14/2020 06/27/2020 chemotherapy held due to hypertension Cycle 2 FOLFOX 06/29/2020 Cycle 3 FOLFOX 07/13/2020 Cycle 4 FOLFOX 07/27/2020 Cycle 5 FOLFOX 08/09/2020 Cycle 6 FOLFOX 08/24/2020 Cycle 7 FOLFOX 09/07/2020 Cycle 8 FOLFOX 09/21/2020 MRI pelvis 10/16/2020-no residual rectal mass, small presacral/perirectal nodes improved from previous MRI 12/06/2020 open low anterior resection with diverting loop colostomy, Dr. Hester Mates; pathology-rectal tumor location entirely below anterior peritoneal reflection; adenocarcinoma; minimal residual tumor; invades submucosa; no macroscopic tumor perforation; no lymphovascular invasion; no perineural invasion; treatment effect present with single cells or rare small groups of cancer cells (near complete response, score 1); all margins negative for invasive carcinoma; 2 of 13 lymph nodes positive; pT1, pN1b 04/04/2021 transverse loop colostomy takedown CT chest 05/04/2021 (CT abdomen/pelvis October 2022)-no evidence of metastatic disease Anemia Chronic renal failure Congestive heart failure-severe LVH Diabetes Gout Peripheral neuropathy Hypertension Fever/chills 01/03/2021-CT with air-fluid collection in the presacral space extending from the level of the anus to the level of S2 worrisome for abscess or contained perforation.  Transferred to [facility].  Drain placed.  Culture positive for E. coli.  Drain removed 01/07/2021.  Repeat CT 01/08/2021 showed near resolution of the fluid component of presacral collection with persistent thick soft tissue rind not amenable to drainage.  Drain was not replaced. Sexual abuse: No  PRECAUTIONS: post rectal CA treatments, ostomy reversal 04/2021  SUBJECTIVE: Pt reports "I've been doing terrible. Now it's just leakage  not a lot of stool but more leakage." Pt states he also has still been having stool leakage at night waking with large loss of bowel though not often. Pt has urge to have bowel movement constantly, when he tries to go to the bathroom but only very small amounts of emptying, usually harder stool types. Pt reports on average he has two leakage instances per day and usually none at night. Pt he has not been attempting HEP at home, sometimes does the abdominal massage and breathing mechanics with voiding, does use step stool consistently. Pt also hasn't tried the peri bottle to help with skin care and reports he has not  started Miralax yet from MD. He has not picked this up yet.   PAIN:  Are you having pain? No      OBJECTIVE:    DIAGNOSTIC FINDINGS:  PATIENT SURVEYS:   PFIQ-7 100    COGNITION:            Overall cognitive status: Within functional limits for tasks assessed                          SENSATION:            Light touch: Appears intact            Proprioception: Appears intact   MUSCLE LENGTH: Bil hamstrings and adductors limited by 50% and HIP IR by 25%  POSTURE:  Rounded shoulders, posterior pelvic tilt   PALPATION: Internal Pelvic Floor deferred at pt request   External Perineal Exam deferred pt at request   GENERAL all abdominal quadrants noted to have fascial restrictions in all directions, decreased mobility at scar site from ostomy reversal (04/2021). No TTP throughout palpations   LUMBARAROM/PROM   Lumbar mobility decreased by 50% in side bending and extension; 75% in flexion and bil rotation   LE AROM/PROM:   Bil LE WFL throughout   LE MMT:   Bil hip abduction 3/5 all others 4/5   PELVIC MMT:   MMT   06/12/2021 06/28/21  Internal Anal Sphincter Deferred at pt request 3/5; isometrics 4s; 6 reps  External Anal Sphincter   2/5  Puborectalis   3/5  Diastasis Recti     (Blank rows = not tested)     TONE: Deferred all internal assessment or  external visualization at this time   06/28/2021 : consented  this date - mildly decreased   TODAY'S TREATMENT   09/18/21:  Pt educated on HEP, abdominal massage, voiding mechanics, breathing mechanics continuation. Pt reports he hasn't been doing HEP often but would like to attempt this more now.  PT completed x10 abdominal massage with CCW and CW circles, x5 M method, x5 ILU method. Pt reports he felt like this helped relax his abdomen more and educated throughout on how to complete at home. Pt reports he had been doing abdominal massage but sometimes differently than the CCW/CW technique.   08/21/2021:  HEP updated to include hip and core strengthening exercises, educated on these and session used to completed these for carry over for home.  2x10 Sit to stand from chair with pelvic floor contraction and breathing mechanics cued.  2x10 bridge with ball squeeze with pelvic floor contraction and breathing mechanics cued.  2x10 opp hand/knee press in hooklying with pelvic floor contraction and breathing mechanics cued.  X10 mario punches 4# min A for balance  X10 alternating cone taps in standing, min A for balance  08/07/2021 : Pt educated on voiding and breathing mechanics for improved bowel habits, pt given handouts on fiber types again and given bowel control handout. Pt also encouraged to reach out to medical team for their recommendations on meal plans and fiber intake. Pt agreed. Pt completed simulated voiding mechanics and breathing 3 reps with extra time spent to educate pt on importance of relaxation while voiding, balloon breathing instead of straining, and continued HEP at home. Pt denied additional treatment after this with stretching or pelvic floor strengthening as he is still not feeling well.   07/24/2021:  Educated on continued HEP as pt reports he hasn't really been doing this a lot, and re-educated on abdominal massage as he reports he hasn't tried this much.  X10 lower trunk  rotations in hooklying 3x30s butterfly stretch 2x45s foam roller for thoracic extension X10 diaphragmatic breathing in hooklying    06/28/2021:   Education on fiber types and voiding mechanics in more detail and breathing mechanics with balloon breathing technique for improved mechanics for decreased frequency, straining, and pain with BMs.   Pt consented to internal pelvic floor assessment rectally this session. Pt found to have weakness at EAS, IAS, and puborectalis details above. Pt also demonstrated deficits in coordination as when he attempted to bulge global tightening was demonstrated and abdominal bulge. Pt educated on this and cued to attempt balloon breathing technique then with this able to relax EAS and demonstrate improved mechanics  and bulge. X10 contractions with balloon breathing technique for improved coordination of pelvic floor and muscle relaxation.  HEP updated to include pelvic floor strengthening and isometric holds. Pt verbalized understanding to all education and to attempt to improve mechanics and implement these and voiding mechanics before next session.    EVAL 06/12/2021 exam completed, findings discussed with pt, educated on POC, HEP, breathing and voiding mechanics, fiber types and abdominal massage.      PATIENT EDUCATION:  Education details: HEP updated  Person educated: Patient Education method: Explanation, Demonstration, Tactile cues, Verbal cues, and Handouts Education comprehension: verbalized understanding and returned demonstration     HOME EXERCISE PROGRAM: [id number]   ASSESSMENT:   CLINICAL IMPRESSION: Patient reports he has seen improvement in symptoms overall but does still have leakage on average 2x per day needing to change pants and shower afterward. Pt has not attempted peri bottle to help with hygiene yet and reports he doesn't feel clean unless showering afterward. Pt session focused on education on importance of attempting HEP, PT  participation and carry over to see if this helps symptoms more consistently. Pt agreed. Pt also lead in abdominal massage for improved peristalsis and bowel movement habits and regularity. Pt denied additional questions. Pt would benefit from additional PT to further address deficits.         OBJECTIVE IMPAIRMENTS decreased coordination, decreased endurance, decreased strength, increased fascial restrictions, increased muscle spasms, impaired flexibility, impaired tone, improper body mechanics, postural dysfunction, and pain.    ACTIVITY LIMITATIONS cleaning, community activity, driving, occupation, yard work, and shopping.    PERSONAL FACTORS Fitness, Past/current experiences, Time since onset of injury/illness/exacerbation, and 1 comorbidity: rectal CA  are also affecting patient's functional outcome.      REHAB POTENTIAL: Good   CLINICAL DECISION MAKING: Stable/uncomplicated   EVALUATION COMPLEXITY: Low     GOALS: Goals reviewed with patient? Yes   SHORT TERM GOALS: Target date: 07/10/2021   Pt to be I with HEP. Baseline: given at eval Goal status: MET   2.  Pt to demonstrate improved recall of voiding mechanics, breathing mechanics, without cues for improved bowel habits at home Baseline: educated at eval Goal status: MET   3.  Pt to report improved bowel movement frequency to no more than 7 per day for improved QOL and regularity Baseline: 12-13 Goal status: MET (does have urge to have BM frequently during the day but reports he usually goes to bathroom to empty about 7 times per day) 09/18/21    4.  Pt to demonstrate at least 3/5 pelvic floor strength to improve ability to without fecal incontinence and improve QOL.  Baseline: deferred testing at eval Goal status: MET   5.  Pt to report no more than 3 fecal incontinence episodes per day for improved QOL and skin integrity  Baseline: several Goal status: MET on average 2 per pt 09/18/21    LONG TERM GOALS: Target date:   09/12/21   Pt to be I with advanced HEP. Baseline: given at eval Goal status: ON GOING   2.  Pt to report improved bowel movement frequency to no more than 3 per day for improved QOL and regularity Baseline: 12-13 Goal status: ON GOING   3.  Pt to demonstrate at least 4/5 pelvic floor strength to improve ability to without fecal incontinence and improve QOL.  Baseline: deferred testing at eval Goal status: ON GOING   4. Pt to report no more than 1 fecal incontinence episodes per day  for improved QOL and skin integrity  Baseline: several Goal status: ON GOING   5.  Pt to demonstrate improved hip strengthening to grossly 4+/5  for improved pelvic stability  Baseline: Bil hip abduction 3/5 all others 4/5 Goal status: ON GOING         PLAN: PT FREQUENCY:  every other week   PT DURATION:  8 sessions   PLANNED INTERVENTIONS: Therapeutic exercises, Therapeutic activity, Neuromuscular re-education, Patient/Family education, Joint mobilization, Spinal mobilization, Manual lymph drainage, scar mobilization, and Manual therapy   PLAN FOR NEXT SESSION: stretching, hip and core strengthening    Stacy Gardner, PT, DPT 06/20/238:46 AM   PHYSICAL THERAPY DISCHARGE SUMMARY  Visits from Start of Care: 6  Current functional level related to goals / functional outcomes: Unable to formally reassess as pt has not returned since last appointment (09/18/21)   Remaining deficits: Unable to formally reassess    Education / Equipment: HEP   Patient agrees to discharge. Patient goals were partially met. Patient is being discharged due to not returning since the last visit. Thank you for the referral, unfortunately pt being discharged due to attendance policy.   Stacy Gardner, PT, DPT 08/09/238:47 AM

## 2021-09-21 ENCOUNTER — Inpatient Hospital Stay (HOSPITAL_BASED_OUTPATIENT_CLINIC_OR_DEPARTMENT_OTHER): Payer: Medicaid Other | Admitting: Nurse Practitioner

## 2021-09-21 ENCOUNTER — Encounter: Payer: Self-pay | Admitting: Nurse Practitioner

## 2021-09-21 ENCOUNTER — Inpatient Hospital Stay: Payer: Medicaid Other | Attending: Oncology

## 2021-09-21 VITALS — BP 180/99 | HR 81 | Temp 98.1°F | Resp 18 | Ht 73.0 in | Wt 222.6 lb

## 2021-09-21 DIAGNOSIS — Z85048 Personal history of other malignant neoplasm of rectum, rectosigmoid junction, and anus: Secondary | ICD-10-CM | POA: Insufficient documentation

## 2021-09-21 DIAGNOSIS — Z08 Encounter for follow-up examination after completed treatment for malignant neoplasm: Secondary | ICD-10-CM | POA: Insufficient documentation

## 2021-09-21 DIAGNOSIS — C20 Malignant neoplasm of rectum: Secondary | ICD-10-CM

## 2021-09-21 LAB — CEA (ACCESS): CEA (CHCC): 1.92 ng/mL (ref 0.00–5.00)

## 2021-09-27 ENCOUNTER — Ambulatory Visit: Payer: Medicaid Other | Admitting: Pharmacist

## 2021-10-09 ENCOUNTER — Telehealth: Payer: Self-pay | Admitting: Physical Therapy

## 2021-10-09 ENCOUNTER — Ambulatory Visit: Payer: Medicaid Other | Attending: Oncology | Admitting: Physical Therapy

## 2021-10-09 DIAGNOSIS — M6281 Muscle weakness (generalized): Secondary | ICD-10-CM | POA: Insufficient documentation

## 2021-10-09 DIAGNOSIS — M62838 Other muscle spasm: Secondary | ICD-10-CM | POA: Insufficient documentation

## 2021-10-09 DIAGNOSIS — R293 Abnormal posture: Secondary | ICD-10-CM | POA: Insufficient documentation

## 2021-10-09 DIAGNOSIS — R279 Unspecified lack of coordination: Secondary | ICD-10-CM | POA: Insufficient documentation

## 2021-10-09 NOTE — Telephone Encounter (Signed)
PT called pt about this morning's missed appointment at 800. Pt did not answer, voicemail not set up.   Unable to leave a massage however this is pt's second missed appointment.   Stacy Gardner, PT, DPT 07/11/238:50 AM

## 2021-10-22 ENCOUNTER — Other Ambulatory Visit: Payer: Self-pay

## 2021-10-24 ENCOUNTER — Encounter: Payer: Medicaid Other | Admitting: Physical Therapy

## 2021-10-30 DIAGNOSIS — C20 Malignant neoplasm of rectum: Secondary | ICD-10-CM | POA: Diagnosis not present

## 2021-11-06 ENCOUNTER — Encounter: Payer: Self-pay | Admitting: Family Medicine

## 2021-11-06 ENCOUNTER — Other Ambulatory Visit: Payer: Self-pay

## 2021-11-06 ENCOUNTER — Ambulatory Visit: Payer: Medicaid Other | Attending: Family Medicine | Admitting: Family Medicine

## 2021-11-06 VITALS — BP 144/87 | HR 67 | Temp 98.2°F | Ht 73.0 in | Wt 215.0 lb

## 2021-11-06 DIAGNOSIS — E1122 Type 2 diabetes mellitus with diabetic chronic kidney disease: Secondary | ICD-10-CM

## 2021-11-06 DIAGNOSIS — Z9189 Other specified personal risk factors, not elsewhere classified: Secondary | ICD-10-CM

## 2021-11-06 DIAGNOSIS — R197 Diarrhea, unspecified: Secondary | ICD-10-CM | POA: Diagnosis not present

## 2021-11-06 DIAGNOSIS — Z23 Encounter for immunization: Secondary | ICD-10-CM

## 2021-11-06 DIAGNOSIS — N184 Chronic kidney disease, stage 4 (severe): Secondary | ICD-10-CM

## 2021-11-06 DIAGNOSIS — M1A00X0 Idiopathic chronic gout, unspecified site, without tophus (tophi): Secondary | ICD-10-CM | POA: Diagnosis not present

## 2021-11-06 DIAGNOSIS — Z85048 Personal history of other malignant neoplasm of rectum, rectosigmoid junction, and anus: Secondary | ICD-10-CM

## 2021-11-06 DIAGNOSIS — I129 Hypertensive chronic kidney disease with stage 1 through stage 4 chronic kidney disease, or unspecified chronic kidney disease: Secondary | ICD-10-CM

## 2021-11-06 LAB — POCT GLYCOSYLATED HEMOGLOBIN (HGB A1C): HbA1c, POC (controlled diabetic range): 5.8 % (ref 0.0–7.0)

## 2021-11-06 MED ORDER — ALLOPURINOL 100 MG PO TABS
100.0000 mg | ORAL_TABLET | Freq: Every day | ORAL | 6 refills | Status: DC
  Filled 2021-11-06: qty 30, 30d supply, fill #0

## 2021-11-06 NOTE — Progress Notes (Signed)
Subjective:  Patient ID: Craig West, male    DOB: [birth year]  Age: 56 y.o. MRN: [id number]  CC: Diabetes   HPI Craig West is a 56 y.o. year old male with a history of hypertension, type 2 diabetes mellitus (A1c 5.8, diet controlled), asthma, gout, stage IV chronic kidney disease, Hypertensive Heart Disease, Rectal carcinoma diagnosed in 12/2018 (completed chemotherapy and radiation), s/p Low anterior resection with diverting Colostomy at [facility] on 12/06/20 with subsequent reversal of colostomy here for a follow up visit.  Interval History:  He Complains of moving bowels a lot and went 15 times yesterday. He received Imodium and fiber tabs from his surgeon which have been ineffective.  Of note he takes Imodium once a day.Marland Kitchen He did have an accident where he moved his bowel on his bed. He hyperdefecates and this causes insomnia. He is using diaper rash due to presence of a rash in his perianal region.  He needs a dental referral for cleaning.  He complains of feeling lightheaded on some occasions and on further questioning has been taking hydralazine 3 times daily rather than twice daily prescribed.  Also notes that he has had elevated blood pressures with systolics in the 707 but then he goes on to explain that he is not sure his BP monitor is working as he has to check his blood pressure monitored multiple times with error messages.  With regards to his CKD he is currently not under the care of nephrology and I had referred him 3 times to nephrology but he never followed through with appointment. Gout has been stable with no flares.  Past Medical History:  Diagnosis Date   Asthma    CHF (congestive heart failure) ([place])    CKD (chronic kidney disease), stage IV ([place])    COVID-19 virus infection 04/2019   Diabetes mellitus without complication (HCC)    Fatigue 11/23/2020   Gout    Hypertension    Leg heaviness 11/23/2020   NSVT (nonsustained ventricular tachycardia) ([place]) 11/23/2020    Rectal cancer [facility])     Past Surgical History:  Procedure Laterality Date   BIOPSY  01/11/2020   Procedure: BIOPSY;  Surgeon: Irene Shipper, MD;  Location: [place];  Service: Endoscopy;;   COLONOSCOPY WITH PROPOFOL N/A 01/11/2020   Procedure: COLONOSCOPY WITH PROPOFOL;  Surgeon: Irene Shipper, MD;  Location: [place];  Service: Endoscopy;  Laterality: N/A;   NO PAST SURGERIES     POLYPECTOMY  01/11/2020   Procedure: POLYPECTOMY;  Surgeon: Irene Shipper, MD;  Location: [facility] ENDOSCOPY;  Service: Endoscopy;;   SUBMUCOSAL TATTOO INJECTION  01/11/2020   Procedure: SUBMUCOSAL TATTOO INJECTION;  Surgeon: Irene Shipper, MD;  Location: [facility] ENDOSCOPY;  Service: Endoscopy;;    Family History  Problem Relation Age of Onset   Heart failure Brother     Social History   Socioeconomic History   Marital status: Single    Spouse name: Not on file   Number of children: Not on file   Years of education: Not on file   Highest education level: Not on file  Occupational History   Not on file  Tobacco Use   Smoking status: Former    Packs/day: 0.50    Types: Cigarettes    Quit date: 10/27/2019    Years since quitting: 2.0   Smokeless tobacco: Never  Vaping Use   Vaping Use: Never used  Substance and Sexual Activity   Alcohol use: Yes    Alcohol/week: 0.0 standard  drinks of alcohol    Comment: occ   Drug use: No   Sexual activity: Not Currently  Other Topics Concern   Not on file  Social History Narrative   Not on file   Social Determinants of Health   Financial Resource Strain: High Risk (06/12/2021)   Overall Financial Resource Strain (CARDIA)    Difficulty of Paying Living Expenses: Hard  Food Insecurity: Food Insecurity Present (06/12/2021)   Hunger Vital Sign    Worried About Running Out of Food in the Last Year: Often true    Ran Out of Food in the Last Year: Often true  Transportation Needs: No Transportation Needs (06/12/2021)   PRAPARE - Radiographer, therapeutic (Medical): No    Lack of Transportation (Non-Medical): No  Physical Activity: Inactive (06/12/2021)   Exercise Vital Sign    Days of Exercise per Week: 0 days    Minutes of Exercise per Session: 0 min  Stress: Stress Concern Present (06/12/2021)   [place]    Feeling of Stress : Rather much  Social Connections: Socially Isolated (06/12/2021)   Social Connection and Isolation Panel [NHANES]    Frequency of Communication with Friends and Family: Twice a week    Frequency of Social Gatherings with Friends and Family: Twice a week    Attends Religious Services: Never    Marine scientist or Organizations: No    Attends Music therapist: Never    Marital Status: Divorced    No Known Allergies  Outpatient Medications Prior to Visit  Medication Sig Dispense Refill   amLODipine (NORVASC) 10 MG tablet Take 1 tablet (10 mg total) by mouth daily. 90 tablet 3   atorvastatin (LIPITOR) 80 MG tablet Take 1 tablet (80 mg total) by mouth daily. 90 tablet 3   Blood Glucose Monitoring Suppl (TRUE METRIX METER) w/Device KIT Check blood sugar fasting and ant bedtime and record 1 kit 0   carvedilol (COREG) 25 MG tablet Take 1 tablet (25 mg total) by mouth 2 (two) times daily with a meal. 180 tablet 3   Colchicine (MITIGARE) 0.6 MG CAPS Take 1 capsule by mouth as needed. Take 1 tablet (0.6 mg) by mouth at the onset of a gout attack with next dose no sooner than 3 days later 30 capsule 1   furosemide (LASIX) 20 MG tablet TAKE 3 TABLETS (60 MG TOTAL) BY MOUTH 2 (TWO) TIMES DAILY. 180 tablet 2   glucose blood (TRUE METRIX BLOOD GLUCOSE TEST) test strip Use as instructed 200 each 12   hydrALAZINE (APRESOLINE) 50 MG tablet Take 1 tablet (50 mg total) by mouth 2 (two) times daily. 60 tablet 2   hydrocortisone (ANUSOL-HC) 25 MG suppository Place 1 suppository (25 mg total) rectally 2 (two) times daily. 30 suppository 1    Misc. Devices (DIGITAL GLASS SCALE) MISC Use scale to weight yourself. Increasing weight may indicate fluid overload 1 each 0   ondansetron (ZOFRAN) 4 MG tablet Take 1 tablet (4 mg total) by mouth every 6 (six) hours. 12 tablet 0   polyethylene glycol powder (GLYCOLAX/MIRALAX) 17 GM/SCOOP powder Take 17 g by mouth daily. 510 g 1   tamsulosin (FLOMAX) 0.4 MG CAPS capsule Take 1 capsule (0.4 mg total) by mouth daily. 30 capsule 2   TRUEplus Lancets 28G MISC Check blood sugar fasting and at bedtime 210 each 2   gabapentin (NEURONTIN) 300 MG capsule TAKE 1 CAPSULE (300  MG TOTAL) BY MOUTH 2 (TWO) TIMES DAILY. (Patient taking differently: Take 300 mg by mouth 2 (two) times daily.) 60 capsule 5   allopurinol (ZYLOPRIM) 100 MG tablet Take 0.5 tablets (50 mg total) by mouth every other day. 15 tablet 0   No facility-administered medications prior to visit.     ROS Review of Systems  Constitutional:  Negative for activity change and appetite change.  HENT:  Negative for sinus pressure and sore throat.   Respiratory:  Negative for chest tightness, shortness of breath and wheezing.   Cardiovascular:  Negative for chest pain and palpitations.  Gastrointestinal:  Negative for abdominal distention, abdominal pain and constipation.  Genitourinary: Negative.   Musculoskeletal: Negative.   Neurological:  Positive for light-headedness.  Psychiatric/Behavioral:  Negative for behavioral problems and dysphoric mood.     Objective:  BP (!) 144/87   Pulse 67   Temp 98.2 F (36.8 C) (Oral)   Ht _0  (1.854 m)   Wt 215 lb (97.5 kg)   SpO2 99%   BMI 28.37 kg/m      11/06/2021    8:41 AM 09/21/2021    8:08 AM 08/23/2021    9:12 AM  BP/Weight  Systolic BP 106 269 97  Diastolic BP 87 99 60  Wt. (Lbs) 215 222.6   BMI 28.37 kg/m2 29.37 kg/m2       Physical Exam Constitutional:      Appearance: He is well-developed.  Cardiovascular:     Rate and Rhythm: Normal rate.     Heart sounds: Normal  heart sounds. No murmur heard. Pulmonary:     Effort: Pulmonary effort is normal.     Breath sounds: Normal breath sounds. No wheezing or rales.  Chest:     Chest wall: No tenderness.  Abdominal:     General: Bowel sounds are normal. There is no distension.     Palpations: Abdomen is soft. There is no mass.     Tenderness: There is no abdominal tenderness.  Musculoskeletal:        General: Normal range of motion.     Right lower leg: No edema.     Left lower leg: No edema.  Neurological:     Mental Status: He is alert and oriented to person, place, and time.  Psychiatric:        Mood and Affect: Mood normal.        Latest Ref Rng & Units 06/27/2021    9:27 AM 03/01/2021   11:35 AM 02/28/2021    8:26 AM  CMP  Glucose 70 - 99 mg/dL 156  95  96   BUN 6 - 24 mg/dL 43  46  48   Creatinine 0.76 - 1.27 mg/dL 2.90  2.98  2.48   Sodium 134 - 144 mmol/L 139  138  140   Potassium 3.5 - 5.2 mmol/L 4.4  5.1  5.0   Chloride 96 - 106 mmol/L 105  109  104   CO2 22 - 32 mmol/L  23  19   Calcium 8.7 - 10.2 mg/dL 8.5  8.5  9.1   Total Protein 6.0 - 8.5 g/dL 7.1  7.3  7.1   Total Bilirubin 0.0 - 1.2 mg/dL 0.7  0.8  0.4   Alkaline Phos 44 - 121 IU/L 127  119  158   AST 0 - 40 IU/L _1 ALT 0 - 44 U/L  20  18     Lipid Panel  Component Value Date/Time   CHOL 140 06/27/2021 0927   TRIG 172 (H) 06/27/2021 0927   HDL 29 (L) 06/27/2021 0927   CHOLHDL 4.8 06/27/2021 0927   CHOLHDL 5.2 (H) 10/23/2015 1009   VLDL 46 (H) 10/23/2015 1009   LDLCALC 81 06/27/2021 0927    CBC    Component Value Date/Time   WBC 8.4 06/27/2021 0927   WBC 8.2 03/01/2021 1135   RBC 4.19 06/27/2021 0927   RBC 4.13 (L) 03/01/2021 1135   HGB 11.1 (L) 06/27/2021 0927   HCT 34.5 (L) 06/27/2021 0927   PLT 236 06/27/2021 0927   MCV 82 06/27/2021 0927   MCH 26.5 (L) 06/27/2021 0927   MCH 26.4 03/01/2021 1135   MCHC 32.2 06/27/2021 0927   MCHC 29.5 (L) 03/01/2021 1135   RDW 16.6 (H) 06/27/2021 0927    LYMPHSABS 1.0 06/27/2021 0927   MONOABS 1.2 (H) 03/01/2021 1135   EOSABS 0.3 06/27/2021 0927   BASOSABS 0.1 06/27/2021 0927    Lab Results  Component Value Date   HGBA1C 5.8 11/06/2021    Assessment & Plan:  1. Type 2 diabetes mellitus with stage 4 chronic kidney disease, without long-term current use of insulin (HCC) Diet controlled with A1c of 5.8 Counseled on Diabetic diet, my plate method, 619 minutes of moderate intensity exercise/week Blood sugar logs with fasting goals of 80-120 mg/dl, random of less than 180 and in the event of sugars less than 60 mg/dl or greater than 400 mg/dl encouraged to notify the clinic. Advised on the need for annual eye exams, annual foot exams, Pneumonia vaccine. - POCT glycosylated hemoglobin (Hb A1C) - Ambulatory referral to Ophthalmology - Microalbumin/Creatinine Ratio, Urine  2. Need for shingles vaccine - Varicella-zoster vaccine IM  3. Idiopathic chronic gout without tophus, unspecified site Stable with no flares - allopurinol (ZYLOPRIM) 100 MG tablet; Take 1 tablet (100 mg total) by mouth daily.  Dispense: 30 tablet; Refill: 6  4. History of rectal cancer Completed chemotherapy and radiation S/p Low anterior resection with diverting Colostomy at [facility] on 12/06/20 with subsequent reversal of colostomy  5. Hypertension in chronic kidney disease due to type 2 diabetes mellitus ([place]) Slightly elevated blood pressure today but I will make no changes given he complains of dizziness It is possible that his blood pressure monitor at home with ineffective and he has been advised to obtain a new blood pressure monitor I will have him see the clinical pharmacist so we can compare blood pressures using the clinic BP monitor versus patient's own monitor No regimen changes today Counseled on blood pressure goal of less than 130/80, low-sodium, DASH diet, medication compliance, 150 minutes of moderate intensity exercise per week. Discussed medication  compliance, adverse effects.  6. CKD (chronic kidney disease), stage IV (HCC) Combination of hypertensive and diabetic nephropathy Referred to nephrology 3 times in the last couple of years and he never followed up I have referred again - Ambulatory referral to Nephrology - Basic Metabolic Panel  7. Need for dental care - Ambulatory referral to Dentistry  8. Diarrhea, unspecified type Symptoms have been present since his surgery Currently on Imodium but has been using this daily and advised to increase this to 3 times daily as needed   Meds ordered this encounter  Medications   allopurinol (ZYLOPRIM) 100 MG tablet    Sig: Take 1 tablet (100 mg total) by mouth daily.    Dispense:  30 tablet    Refill:  6    Follow-up:  Return in about 3 weeks (around 11/27/2021) for Blood pressure follow-up with Lurena Joiner, Chronic medical conditions in 6 months.       Charlott Rakes, MD, FAAFP. [facility] and [facility], [place]   11/06/2021, 11:18 AM

## 2021-11-06 NOTE — Progress Notes (Signed)
Pt has concerns about blood pressure being to high. Pt sates that he feels light headed when taking Hydralazine, but he has been taking 1 tab 3 times a day instead of taking 1 tab twice a day.

## 2021-11-06 NOTE — Patient Instructions (Signed)
Diarrhea, Adult Diarrhea is frequent loose and watery bowel movements. Diarrhea can make you feel weak and cause you to become dehydrated. Dehydration can make you tired and thirsty, cause you to have a dry mouth, and decrease how often you urinate. Diarrhea typically lasts 2-3 days. However, it can last longer if it is a sign of something more serious. It is important to treat your diarrhea as told by your health care provider. Follow these instructions at home: Eating and drinking     Follow these recommendations as told by your health care provider: Take an oral rehydration solution (ORS). This is an over-the-counter medicine that helps return your body to its normal balance of nutrients and water. It is found at pharmacies and retail stores. Drink plenty of fluids, such as water, ice chips, diluted fruit juice, and low-calorie sports drinks. You can drink milk also, if desired. Avoid drinking fluids that contain a lot of sugar or caffeine, such as energy drinks, sports drinks, and soda. Eat bland, easy-to-digest foods in small amounts as you are able. These foods include bananas, applesauce, rice, lean meats, toast, and crackers. Avoid alcohol. Avoid spicy or fatty foods.  Medicines Take over-the-counter and prescription medicines only as told by your health care provider. If you were prescribed an antibiotic medicine, take it as told by your health care provider. Do not stop using the antibiotic even if you start to feel better. General instructions  Wash your hands often using soap and water. If soap and water are not available, use a hand sanitizer. Others in the household should wash their hands as well. Hands should be washed: After using the toilet or changing a diaper. Before preparing, cooking, or serving food. While caring for a sick person or while visiting someone in a hospital. Drink enough fluid to keep your urine pale yellow. Rest at home while you recover. Watch your  condition for any changes. Take a warm bath to relieve any burning or pain from frequent diarrhea episodes. Keep all follow-up visits as told by your health care provider. This is important. Contact a health care provider if: You have a fever. Your diarrhea gets worse. You have new symptoms. You cannot keep fluids down. You feel light-headed or dizzy. You have a headache. You have muscle cramps. Get help right away if: You have chest pain. You feel extremely weak or you faint. You have bloody or black stools or stools that look like tar. You have severe pain, cramping, or bloating in your abdomen. You have trouble breathing or you are breathing very quickly. Your heart is beating very quickly. Your skin feels cold and clammy. You feel confused. You have signs of dehydration, such as: Dark urine, very little urine, or no urine. Cracked lips. Dry mouth. Sunken eyes. Sleepiness. Weakness. Summary Diarrhea is frequent loose and sometimes watery bowel movements. Diarrhea can make you feel weak and cause you to become dehydrated. Drink enough fluids to keep your urine pale yellow. Make sure that you wash your hands after using the toilet. If soap and water are not available, use hand sanitizer. Contact a health care provider if your diarrhea gets worse or you have new symptoms. Get help right away if you have signs of dehydration. This information is not intended to replace advice given to you by your health care provider. Make sure you discuss any questions you have with your health care provider. Document Revised: 06/08/2021 Document Reviewed: 09/27/2020 Elsevier Patient Education  2023 Elsevier Inc.  

## 2021-11-07 ENCOUNTER — Telehealth: Payer: Self-pay | Admitting: Physical Therapy

## 2021-11-07 ENCOUNTER — Ambulatory Visit: Payer: Medicaid Other | Attending: Oncology | Admitting: Physical Therapy

## 2021-11-07 DIAGNOSIS — M6281 Muscle weakness (generalized): Secondary | ICD-10-CM | POA: Insufficient documentation

## 2021-11-07 DIAGNOSIS — R293 Abnormal posture: Secondary | ICD-10-CM | POA: Insufficient documentation

## 2021-11-07 DIAGNOSIS — R279 Unspecified lack of coordination: Secondary | ICD-10-CM | POA: Insufficient documentation

## 2021-11-07 DIAGNOSIS — M62838 Other muscle spasm: Secondary | ICD-10-CM | POA: Insufficient documentation

## 2021-11-07 LAB — BASIC METABOLIC PANEL
BUN/Creatinine Ratio: 13 (ref 9–20)
BUN: 39 mg/dL — ABNORMAL HIGH (ref 6–24)
CO2: 18 mmol/L — ABNORMAL LOW (ref 20–29)
Calcium: 8.9 mg/dL (ref 8.7–10.2)
Chloride: 101 mmol/L (ref 96–106)
Creatinine, Ser: 2.93 mg/dL — ABNORMAL HIGH (ref 0.76–1.27)
Glucose: 119 mg/dL — ABNORMAL HIGH (ref 70–99)
Potassium: 4.3 mmol/L (ref 3.5–5.2)
Sodium: 139 mmol/L (ref 134–144)
eGFR: 24 mL/min/{1.73_m2} — ABNORMAL LOW (ref >59)

## 2021-11-07 NOTE — Telephone Encounter (Signed)
PT called pt about this morning's appointment at 0800. PT spoke with pt and explained based on pt's missing three appointments for physical therapy he will unfortunately need to be discharged based on our attendance policy. PT verbalized understanding.   Stacy Gardner, PT, DPT 08/09/238:46 AM

## 2021-11-13 ENCOUNTER — Other Ambulatory Visit: Payer: Self-pay

## 2021-11-20 ENCOUNTER — Encounter: Payer: Medicaid Other | Admitting: Physical Therapy

## 2021-12-17 ENCOUNTER — Telehealth: Payer: Self-pay | Admitting: Family Medicine

## 2021-12-17 ENCOUNTER — Encounter: Payer: Self-pay | Admitting: Oncology

## 2021-12-17 ENCOUNTER — Ambulatory Visit: Payer: Self-pay

## 2021-12-17 ENCOUNTER — Other Ambulatory Visit: Payer: Self-pay

## 2021-12-17 DIAGNOSIS — M1A00X0 Idiopathic chronic gout, unspecified site, without tophus (tophi): Secondary | ICD-10-CM

## 2021-12-17 DIAGNOSIS — E1122 Type 2 diabetes mellitus with diabetic chronic kidney disease: Secondary | ICD-10-CM

## 2021-12-17 DIAGNOSIS — N401 Enlarged prostate with lower urinary tract symptoms: Secondary | ICD-10-CM

## 2021-12-17 DIAGNOSIS — I110 Hypertensive heart disease with heart failure: Secondary | ICD-10-CM

## 2021-12-17 MED ORDER — FUROSEMIDE 20 MG PO TABS
60.0000 mg | ORAL_TABLET | Freq: Two times a day (BID) | ORAL | 2 refills | Status: DC
  Filled 2021-12-17: qty 180, 30d supply, fill #0

## 2021-12-17 MED ORDER — HYDRALAZINE HCL 50 MG PO TABS
50.0000 mg | ORAL_TABLET | Freq: Two times a day (BID) | ORAL | 2 refills | Status: DC
  Filled 2021-12-17: qty 60, 30d supply, fill #0

## 2021-12-17 MED ORDER — ATORVASTATIN CALCIUM 80 MG PO TABS
80.0000 mg | ORAL_TABLET | Freq: Every day | ORAL | 3 refills | Status: DC
  Filled 2021-12-17: qty 90, 90d supply, fill #0

## 2021-12-17 MED ORDER — COLCHICINE 0.6 MG PO CAPS
1.0000 | ORAL_CAPSULE | ORAL | 1 refills | Status: DC | PRN
  Filled 2021-12-17: qty 10, 30d supply, fill #0

## 2021-12-17 MED ORDER — CARVEDILOL 25 MG PO TABS
25.0000 mg | ORAL_TABLET | Freq: Two times a day (BID) | ORAL | 3 refills | Status: DC
  Filled 2021-12-17: qty 180, 90d supply, fill #0
  Filled 2022-08-08: qty 180, 90d supply, fill #1

## 2021-12-17 MED ORDER — AMLODIPINE BESYLATE 10 MG PO TABS
10.0000 mg | ORAL_TABLET | Freq: Every day | ORAL | 3 refills | Status: DC
  Filled 2021-12-17: qty 90, 90d supply, fill #0
  Filled 2022-08-08: qty 90, 90d supply, fill #1

## 2021-12-17 MED ORDER — TAMSULOSIN HCL 0.4 MG PO CAPS
0.4000 mg | ORAL_CAPSULE | Freq: Every day | ORAL | 2 refills | Status: DC
  Filled 2021-12-17: qty 30, 30d supply, fill #0

## 2021-12-17 MED ORDER — ALLOPURINOL 100 MG PO TABS
100.0000 mg | ORAL_TABLET | Freq: Every day | ORAL | 6 refills | Status: DC
  Filled 2021-12-17: qty 30, 30d supply, fill #0

## 2021-12-17 NOTE — Telephone Encounter (Signed)
Refills have been sent.  

## 2021-12-17 NOTE — Telephone Encounter (Signed)
Pt is requesting refill on medication due to him being evicted from his home.

## 2021-12-17 NOTE — Addendum Note (Signed)
Addended by: Charlott Rakes on: 12/17/2021 05:22 PM   Modules accepted: Orders

## 2021-12-17 NOTE — Telephone Encounter (Signed)
  Chief Complaint: Missing medications Symptoms: Pt has none of his medications Frequency: now Pertinent Negatives: Patient denies  Disposition: '[]'$ ED /'[]'$ Urgent Care (no appt availability in office) / '[]'$ Appointment(In office/virtual)/ '[]'$  [place] [facility]/ '[]'$ Home Care/ '[]'$ Refused Recommended Disposition /'[]'$ [place] [facility]/ '[x]'$  Follow-up with PCP Additional Notes: Pt lost all of his medications when he lost his home. PT is not sure of all of the medications he was taking.   PT was sure he was taking: Allopurinol  Colchicine  Lasix  He seemed fairly sure about needing to take several heart/ HTN medications other than lasix.  I read the list to him, I was not confident in his responses for: Amlodipine Carvedilol  Hydralazine  Pt states he was not taking:  Anusol Zofran Flomax Miralax  Pt was unsure about taking Gabapentin.  PT states he does not need glucose testing supplies.  Please advise. Some of these medications were prescribed by his cardiologist.  Med should be called into [facility].    Summary: Medication Questions   Patient states that he was evicted from his home and all of his medications were taken. Patient states he has been off his medication for about a week and they need to be refilled. Patient was not able to confirm which medications he is currently supposed to be taking. Please advise patient.      Reason for Disposition  [1] Prescription refill request for ESSENTIAL medicine (i.e., likelihood of harm to patient if not taken) AND [2] triager unable to refill per department policy  Answer Assessment - Initial Assessment Questions 1. DRUG NAME: "What medicine do you need to have refilled?"     Unsure 2. REFILLS REMAINING: "How many refills are remaining?" (Note: The label on the medicine or pill bottle will show how many refills are remaining. If there are no refills remaining, then a renewal may be needed.)      3. EXPIRATION DATE:  "What is the expiration date?" (Note: The label states when the prescription will expire, and thus can no longer be refilled.)      4. PRESCRIBING HCP: "Who prescribed it?" Reason: If prescribed by specialist, call should be referred to that group.     Dr. Margarita Rana and Cardiologist 5. SYMPTOMS: "Do you have any symptoms?"      6. PREGNANCY: "Is there any chance that you are pregnant?" "When was your last menstrual period?"  Protocols used: Medication Refill and Renewal Call-A-AH

## 2021-12-18 ENCOUNTER — Other Ambulatory Visit: Payer: Self-pay

## 2021-12-19 ENCOUNTER — Other Ambulatory Visit: Payer: Self-pay

## 2021-12-19 NOTE — Telephone Encounter (Signed)
Patient aware that 8 prescriptions were sent the pharmacy. State he will not be able to get them until Friday.

## 2021-12-21 ENCOUNTER — Other Ambulatory Visit: Payer: Self-pay | Admitting: Pharmacist

## 2021-12-21 ENCOUNTER — Other Ambulatory Visit: Payer: Self-pay

## 2021-12-21 MED ORDER — COLCHICINE 0.6 MG PO TABS
ORAL_TABLET | ORAL | 1 refills | Status: DC
  Filled 2021-12-21: qty 30, 30d supply, fill #0
  Filled 2021-12-31: qty 30, 10d supply, fill #0

## 2021-12-28 ENCOUNTER — Other Ambulatory Visit: Payer: Self-pay

## 2021-12-31 ENCOUNTER — Ambulatory Visit: Payer: Self-pay

## 2021-12-31 ENCOUNTER — Other Ambulatory Visit: Payer: Self-pay

## 2021-12-31 NOTE — Telephone Encounter (Signed)
We do not keep samples of medication(s) in the office.  Patient advised to contact pharmacy. His call was transferred to [facility].

## 2021-12-31 NOTE — Telephone Encounter (Signed)
    Chief Complaint: Pt. Out of his gout medication and can't afford to buy it. Asking if the practice has samples, "or can I come in for a shot?" Symptoms: Pain, swelling left Great toe. Frequency: Yesterday Pertinent Negatives: Patient denies fever Disposition: '[]'$ ED /'[]'$ Urgent Care (no appt availability in office) / '[]'$ Appointment(In office/virtual)/ '[]'$  [place] [facility]/ '[]'$ Home Care/ '[]'$ Refused Recommended Disposition /'[]'$ [place] [facility]/ '[x]'$  Follow-up with PCP Additional Notes: Please advise pt.  Answer Assessment - Initial Assessment Questions 1. ONSET: "When did the pain start?"      Yesterday 2. LOCATION: "Where is the pain located?"      Left foot 3. PAIN: "How bad is the pain?"    (Scale 1-10; or mild, moderate, severe)  - MILD (1-3): doesn't interfere with normal activities.   - MODERATE (4-7): interferes with normal activities (e.g., work or school) or awakens from sleep, limping.   - SEVERE (8-10): excruciating pain, unable to do any normal activities, unable to walk.      10 4. WORK OR EXERCISE: "Has there been any recent work or exercise that involved this part of the body?"      No 5. CAUSE: "What do you think is causing the foot pain?"     Gout 6. OTHER SYMPTOMS: "Do you have any other symptoms?" (e.g., leg pain, rash, fever, numbness)     Swelling 7. PREGNANCY: "Is there any chance you are pregnant?" "When was your last menstrual period?"     N/a  Protocols used: Foot Pain-A-AH

## 2022-01-01 ENCOUNTER — Other Ambulatory Visit: Payer: Self-pay

## 2022-01-21 ENCOUNTER — Inpatient Hospital Stay: Payer: Medicaid Other | Attending: Oncology

## 2022-01-21 ENCOUNTER — Ambulatory Visit (HOSPITAL_BASED_OUTPATIENT_CLINIC_OR_DEPARTMENT_OTHER): Payer: Medicaid Other

## 2022-01-21 DIAGNOSIS — M109 Gout, unspecified: Secondary | ICD-10-CM | POA: Insufficient documentation

## 2022-01-21 DIAGNOSIS — N189 Chronic kidney disease, unspecified: Secondary | ICD-10-CM | POA: Insufficient documentation

## 2022-01-21 DIAGNOSIS — D649 Anemia, unspecified: Secondary | ICD-10-CM | POA: Insufficient documentation

## 2022-01-21 DIAGNOSIS — Z23 Encounter for immunization: Secondary | ICD-10-CM | POA: Insufficient documentation

## 2022-01-21 DIAGNOSIS — I129 Hypertensive chronic kidney disease with stage 1 through stage 4 chronic kidney disease, or unspecified chronic kidney disease: Secondary | ICD-10-CM | POA: Insufficient documentation

## 2022-01-21 DIAGNOSIS — E1140 Type 2 diabetes mellitus with diabetic neuropathy, unspecified: Secondary | ICD-10-CM | POA: Insufficient documentation

## 2022-01-21 DIAGNOSIS — E1122 Type 2 diabetes mellitus with diabetic chronic kidney disease: Secondary | ICD-10-CM | POA: Insufficient documentation

## 2022-01-21 DIAGNOSIS — C20 Malignant neoplasm of rectum: Secondary | ICD-10-CM | POA: Insufficient documentation

## 2022-01-23 ENCOUNTER — Telehealth: Payer: Self-pay | Admitting: *Deleted

## 2022-01-23 NOTE — Telephone Encounter (Signed)
Mr. Retz was "no show" for his CT scan on 01/21/22. Attempted to reach him to discuss this and his OV tomorrow. Not able to reach him.

## 2022-01-24 ENCOUNTER — Telehealth: Payer: Self-pay | Admitting: *Deleted

## 2022-01-24 ENCOUNTER — Inpatient Hospital Stay (HOSPITAL_BASED_OUTPATIENT_CLINIC_OR_DEPARTMENT_OTHER): Payer: Medicaid Other | Admitting: Oncology

## 2022-01-24 ENCOUNTER — Encounter: Payer: Self-pay | Admitting: *Deleted

## 2022-01-24 ENCOUNTER — Inpatient Hospital Stay: Payer: Medicaid Other

## 2022-01-24 VITALS — BP 143/95 | HR 72 | Temp 98.2°F | Resp 18 | Ht 73.0 in | Wt 211.2 lb

## 2022-01-24 DIAGNOSIS — D649 Anemia, unspecified: Secondary | ICD-10-CM | POA: Diagnosis not present

## 2022-01-24 DIAGNOSIS — C20 Malignant neoplasm of rectum: Secondary | ICD-10-CM | POA: Diagnosis not present

## 2022-01-24 DIAGNOSIS — E1122 Type 2 diabetes mellitus with diabetic chronic kidney disease: Secondary | ICD-10-CM | POA: Diagnosis not present

## 2022-01-24 DIAGNOSIS — E1140 Type 2 diabetes mellitus with diabetic neuropathy, unspecified: Secondary | ICD-10-CM | POA: Diagnosis not present

## 2022-01-24 DIAGNOSIS — N189 Chronic kidney disease, unspecified: Secondary | ICD-10-CM | POA: Diagnosis not present

## 2022-01-24 DIAGNOSIS — M109 Gout, unspecified: Secondary | ICD-10-CM | POA: Diagnosis not present

## 2022-01-24 DIAGNOSIS — Z23 Encounter for immunization: Secondary | ICD-10-CM

## 2022-01-24 DIAGNOSIS — I129 Hypertensive chronic kidney disease with stage 1 through stage 4 chronic kidney disease, or unspecified chronic kidney disease: Secondary | ICD-10-CM | POA: Diagnosis not present

## 2022-01-24 LAB — BASIC METABOLIC PANEL - CANCER CENTER ONLY
Anion gap: 9 (ref 5–15)
BUN: 51 mg/dL — ABNORMAL HIGH (ref 6–20)
CO2: 23 mmol/L (ref 22–32)
Calcium: 8.5 mg/dL — ABNORMAL LOW (ref 8.9–10.3)
Chloride: 106 mmol/L (ref 98–111)
Creatinine: 3.76 mg/dL (ref 0.61–1.24)
GFR, Estimated: 18 mL/min — ABNORMAL LOW (ref >60)
Glucose, Bld: 124 mg/dL — ABNORMAL HIGH (ref 70–99)
Potassium: 4.1 mmol/L (ref 3.5–5.1)
Sodium: 138 mmol/L (ref 135–145)

## 2022-01-24 LAB — CEA (ACCESS): CEA (CHCC): 2.02 ng/mL (ref 0.00–5.00)

## 2022-01-24 MED ORDER — INFLUENZA VAC SPLIT QUAD 0.5 ML IM SUSY
0.5000 mL | PREFILLED_SYRINGE | Freq: Once | INTRAMUSCULAR | Status: AC
  Administered 2022-01-24: 0.5 mL via INTRAMUSCULAR
  Filled 2022-01-24: qty 0.5

## 2022-01-24 NOTE — Progress Notes (Unsigned)
CRITICAL VALUE STICKER  CRITICAL VALUE:Creatinine 3.76  RECEIVER (on-site recipient of call):Jackey Housey,RN  DATE & TIME NOTIFIED: 01/24/22 @ 0945  MESSENGER (representative from lab):Jordan Hawks  MD NOTIFIED: Dr. Benay Spice  TIME OF NOTIFICATION: 1518 RESPONSE: Needs appointment with his nephrologist asap. Higher than his baseline.

## 2022-01-24 NOTE — Telephone Encounter (Signed)
-----   Message from Ladell Pier, MD sent at 01/24/2022 10:04 AM EDT ----- Please call patient, the creatinine is more elevated, he needs an appointment with his nephrologist soon as possible

## 2022-01-24 NOTE — Progress Notes (Signed)
[place] OFFICE PROGRESS NOTE   Diagnosis: Rectal cancer  INTERVAL HISTORY:   Craig. [name] returns as scheduled.  He feels well.  He reports frequent bowel movements since undergoing the ileostomy reversal procedure.  He has completed pelvic physical therapy.  He has mild neuropathy symptoms in the feet.  Objective:  Vital signs in last 24 hours:  Blood pressure (!) 143/95, pulse 72, temperature 98.2 F (36.8 C), resp. rate 18, height '6\' 1"'$  (1.854 m), weight 211 lb 3.2 oz (95.8 kg), SpO2 99 %.     Lymphatics: No cervical, supraclavicular, axillary, or inguinal nodes Resp: Lungs clear bilaterally Cardio: Regular rate and rhythm, +3 gallop GI: No hepatosplenomegaly, no mass, nontender Vascular: No leg edema   Lab Results:  Lab Results  Component Value Date   WBC 8.4 06/27/2021   HGB 11.1 (L) 06/27/2021   HCT 34.5 (L) 06/27/2021   MCV 82 06/27/2021   PLT 236 06/27/2021   NEUTROABS 6.3 06/27/2021    CMP  Lab Results  Component Value Date   NA 139 11/06/2021   K 4.3 11/06/2021   CL 101 11/06/2021   CO2 18 (L) 11/06/2021   GLUCOSE 119 (H) 11/06/2021   BUN 39 (H) 11/06/2021   CREATININE 2.93 (H) 11/06/2021   CALCIUM 8.9 11/06/2021   PROT 7.1 06/27/2021   ALBUMIN 3.9 06/27/2021   AST 14 06/27/2021   ALT 20 03/01/2021   ALKPHOS 127 (H) 06/27/2021   BILITOT 0.7 06/27/2021   GFRNONAA 24 (L) 03/01/2021   GFRAA 15 (L) 01/03/2020    Lab Results  Component Value Date   CEA1 4.40 10/10/2020   CEA 1.92 09/21/2021    Medications: I have reviewed the patient's current medications.   Assessment/Plan: Rectal cancer-rectal mass at 5-6 cm from the anal verge on colonoscopy 01/11/2020, biopsy confirmed adenocarcinoma CTs 01/11/2020-rectal thickening, small perirectal lymph nodes and external iliac nodes, moderate right pleural effusion MRI pelvis 01/27/2020-T3bN2b tumor at 7.1 cm from the internal anal sphincter, multiple mesorectal nodes, indistinct  left pelvic sidewall node and enlarged bilateral external iliac nodes PET scan 02/11/2020-hypermetabolic rectosigmoid mass, hypermetabolic mesorectal and sigmoid mesocolon lymph nodes, left external iliac/obturator node with mild hypermetabolism, 18 mm left inguinal node with normal architecture and slight hypermetabolism Radiation and infusional 5-FU 02/21/2020-04/04/2020 CTs 05/09/2020-decreased perirectal and sigmoid mesocolon lymphadenopathy, no evidence of disease progression MRI pelvis 05/17/2020-no well-defined residual rectal mass seen.  Wall appears slightly irregular.  No perirectal extension of tumor.  Again identified is perirectal adenopathy, similar to most recent CT 05/10/2020 but improved compared to PET/CT of 02/11/2020. Cycle 1 FOLFOX 06/14/2020 06/27/2020 chemotherapy held due to hypertension Cycle 2 FOLFOX 06/29/2020 Cycle 3 FOLFOX 07/13/2020 Cycle 4 FOLFOX 07/27/2020 Cycle 5 FOLFOX 08/09/2020 Cycle 6 FOLFOX 08/24/2020 Cycle 7 FOLFOX 09/07/2020 Cycle 8 FOLFOX 09/21/2020 MRI pelvis 10/16/2020-no residual rectal mass, small presacral/perirectal nodes improved from previous MRI 12/06/2020 open low anterior resection with diverting loop colostomy, Dr. Hester Mates; pathology-rectal tumor location entirely below anterior peritoneal reflection; adenocarcinoma; minimal residual tumor; invades submucosa; no macroscopic tumor perforation; no lymphovascular invasion; no perineural invasion; treatment effect present with single cells or rare small groups of cancer cells (near complete response, score 1); all margins negative for invasive carcinoma; 2 of 13 lymph nodes positive; pT1, pN1b 04/04/2021 transverse loop colostomy takedown CT chest 05/04/2021 (CT abdomen/pelvis October 2022)-no evidence of metastatic disease Anemia Chronic renal failure Congestive heart failure-severe LVH Diabetes Gout Peripheral neuropathy? Hypertension Fever/chills 01/03/2021-CT with air-fluid collection in the presacral space  extending from the level of the anus to the level of S2 worrisome for abscess or contained perforation.  Transferred to [place].  Drain placed.  Culture positive for E. coli.  Drain removed 01/07/2021.  Repeat CT 01/08/2021 showed near resolution of the fluid component of presacral collection with persistent thick soft tissue rind not amenable to drainage.  Drain was not replaced. Influenza A 03/01/2021       Disposition: Craig West is in clinical remission from rectal cancer.  He missed surveillance CT scheduled for earlier this week.  These will be rescheduled for a soon as possible.  We will obtain a CEA today.  He will return for an office visit and CEA in 6 months.  I encouraged him to schedule a follow-up appointment with cardiology.  He is scheduled to see Dr. Hester Mates in January.  Betsy Coder, MD  01/24/2022  8:11 AM

## 2022-01-24 NOTE — Telephone Encounter (Signed)
Notified Craig West that renal functions are more elevated and he needs to see nephrologist soon. He reports he has a new patient appointment on 10/31 with [name]. Routed labs to office.

## 2022-01-28 ENCOUNTER — Ambulatory Visit (HOSPITAL_BASED_OUTPATIENT_CLINIC_OR_DEPARTMENT_OTHER): Payer: Medicaid Other | Admitting: Cardiovascular Disease

## 2022-01-28 NOTE — Progress Notes (Deleted)
Cardiology Office Visit:    Date:  01/28/2022   ID:  Craig West, DOB 06/10/1965, MRN [id number]  PCP:  Charlott Rakes, MD  Cardiologist:  Skeet Latch, MD  Nephrologist:  Referring MD: Charlott Rakes, MD   CC: Hypertension  History of Present Illness:    Craig West is a 56 y.o. male with a hx of hypertension, diabetes, CKD IV, rectal carcinoma, and asthma  here for follow up.  He was initially seen to establish care in the advanced hypertension clinic. Craig West reported having hypertension for at least 2 years.  He struggled to afford his medications due to lack of insurance and lack of income.  He was admitted to the hospital 12/2019 with hypertensive emergency.  He presented to the hospital with subacute onset of shortness of breath and edema.  He reported several nights of orthopnea.  He initially thought that his edema was attributable to gout.  In the ED his blood pressure was 263/163.  He was given labetalol and IV hydralazine.  His home antihypertensives were subsequently resumed (amlodipine, hydrochlorothiazide, and lisinopril).  An echocardiogram was obtained that revealed LVEF 60 to 65% with severe LVH.  Left ventricular wall thickness was 1.8 to 1.9 cm.  Cardiology was consulted due to concern for possible infiltrative cardiomyopathy.  He denies any palpitations and has no family history of sudden cardiac death.  There were some short episodes of NSVT.  High-sensitivity troponin was elevated to 218.  His home lisinopril and hydrochlorothiazide were discontinued due to his CKD 4.  He was diuresed with IV Lasix and this was switched to oral at discharge.  He was started on carvedilol and hydralazine.  His weight at the time of discharge was 232 pounds.  Craig West saw his PCP on 10/4.  At that time he was hypotensive to 84/60.  Amlodipine was reduced to 5 mg.  His weight had increased to 260 pounds.  Labs were obtained and BUN was 84 with a creatinine increased to 4.79.   Potassium was 5.9. Since that time he was also diagnosed with rectal carcinoma and completed for Chemo and XRT.  He completed 8 rounds of FOLFOX.    At his last visit, he was concerned that his blood pressure would not be under control for his procedure. He took his medicine but his blood pressure was still elevated. Hydralazine and carvedilol were increased. He reported exertional dyspnea. He had a nuclear stress test 11/2020 which showed a prior inferior infarct and LVEF 47% but no ischemia. He also reported symptoms of claudication. He had ABIs 12/2020 that were normal. He completed chemotherapy and radiation for rectal carcinoma and had a lower anterior resection with diverting colostomy at [facility] on 11/2020.   At his last appointment his blood pressures were well controlled and he was stable.  Repeat echo  Past Medical History:  Diagnosis Date   Asthma    CHF (congestive heart failure) ([place])    CKD (chronic kidney disease), stage IV ([place])    COVID-19 virus infection 04/2019   Diabetes mellitus without complication (HCC)    Fatigue 11/23/2020   Gout    Hypertension    Leg heaviness 11/23/2020   NSVT (nonsustained ventricular tachycardia) ([place]) 11/23/2020   Rectal cancer [facility])     Past Surgical History:  Procedure Laterality Date   BIOPSY  01/11/2020   Procedure: BIOPSY;  Surgeon: Irene Shipper, MD;  Location: [place];  Service: Endoscopy;;   COLONOSCOPY WITH PROPOFOL N/A 01/11/2020  Procedure: COLONOSCOPY WITH PROPOFOL;  Surgeon: Irene Shipper, MD;  Location: [facility] ENDOSCOPY;  Service: Endoscopy;  Laterality: N/A;   NO PAST SURGERIES     POLYPECTOMY  01/11/2020   Procedure: POLYPECTOMY;  Surgeon: Irene Shipper, MD;  Location: [facility] ENDOSCOPY;  Service: Endoscopy;;   SUBMUCOSAL TATTOO INJECTION  01/11/2020   Procedure: SUBMUCOSAL TATTOO INJECTION;  Surgeon: Irene Shipper, MD;  Location: [facility] ENDOSCOPY;  Service: Endoscopy;;    Current Medications: No outpatient medications have been  marked as taking for the 01/28/22 encounter (Appointment) with Skeet Latch, MD.     Allergies:   Patient has no known allergies.   Social History   Socioeconomic History   Marital status: Single    Spouse name: Not on file   Number of children: Not on file   Years of education: Not on file   Highest education level: Not on file  Occupational History   Not on file  Tobacco Use   Smoking status: Former    Packs/day: 0.50    Types: Cigarettes    Quit date: 10/27/2019    Years since quitting: 2.2   Smokeless tobacco: Never  Vaping Use   Vaping Use: Never used  Substance and Sexual Activity   Alcohol use: Yes    Alcohol/week: 0.0 standard drinks of alcohol    Comment: occ   Drug use: No   Sexual activity: Not Currently  Other Topics Concern   Not on file  Social History Narrative   Not on file   Social Determinants of Health   Financial Resource Strain: High Risk (06/12/2021)   Overall Financial Resource Strain (CARDIA)    Difficulty of Paying Living Expenses: Hard  Food Insecurity: Food Insecurity Present (06/12/2021)   Hunger Vital Sign    Worried About Running Out of Food in the Last Year: Often true    Ran Out of Food in the Last Year: Often true  Transportation Needs: No Transportation Needs (06/12/2021)   PRAPARE - Hydrologist (Medical): No    Lack of Transportation (Non-Medical): No  Physical Activity: Inactive (06/12/2021)   Exercise Vital Sign    Days of Exercise per Week: 0 days    Minutes of Exercise per Session: 0 min  Stress: Stress Concern Present (06/12/2021)   [place]    Feeling of Stress : Rather much  Social Connections: Socially Isolated (06/12/2021)   Social Connection and Isolation Panel [NHANES]    Frequency of Communication with Friends and Family: Twice a week    Frequency of Social Gatherings with Friends and Family: Twice a week    Attends  Religious Services: Never    Marine scientist or Organizations: No    Attends Music therapist: Never    Marital Status: Divorced     Family History: The patient's family history includes Heart failure in his brother.  ROS:   Please see the history of present illness.    (+) Numbness (Bilateral toes and fingers) All other systems reviewed and are negative.  EKGs/Labs/Other Studies Reviewed:    Myocardial Lexiscan 12/01/20   Findings are consistent with prior myocardial infarction. The study is intermediate risk.   No ST deviation was noted.   LV perfusion is abnormal. Defect 1: There is a medium defect with moderate reduction in uptake present in the mid to basal inferior location(s) that is fixed. There is abnormal wall motion in the defect  area. Consistent with infarction.   Left ventricular function is abnormal. Global function is mildly reduced. There was a single regional abnormality. Nuclear stress EF: 47 %. The left ventricular ejection fraction is mildly decreased (45-54%). End diastolic cavity size is moderately enlarged. End systolic cavity size is normal.   Prior study not available for comparison. Medium size, moderate intensity fixed basal to mid inferior perfusion defect, suggestive of scar or possibly RBBB-related artifact. SDS 1. LVEF 47% with basal to mid inferior hypokinesis. This is an intermediate risk study. RV tracer uptake noted, which may represent elevated pulmonary pressure. No prior for comparison.  Echo 12/25/19: 1. Consider infiltrative cardiomyopathy such as amyloidosis or global  variant hypertrophic cardiomyopathy - LV wall thickness 1.8-1.9 cm. Left  ventricular ejection fraction, by estimation, is 60 to 65%. The left  ventricle has normal function. The left  ventricle has no regional wall motion abnormalities. There is severe  concentric left ventricular hypertrophy. Left ventricular diastolic  parameters are consistent with Grade II  diastolic dysfunction  (pseudonormalization). Elevated left ventricular  end-diastolic pressure.   2. Right ventricular systolic function is normal. The right ventricular  size is normal. Tricuspid regurgitation signal is inadequate for assessing  PA pressure.   3. Left atrial size was mildly dilated.   4. The mitral valve is abnormal. Mild mitral valve regurgitation.   5. The aortic valve is tricuspid. Aortic valve regurgitation is not  visualized. Mild aortic valve sclerosis is present, with no evidence of  aortic valve stenosis.   6. The inferior vena cava is normal in size with greater than 50%  respiratory variability, suggesting right atrial pressure of 3 mmHg.   Renal artery Doppler 12/2019: Normal bilaterally.  EKG:  EKG was not ordered today 11/23/2020: Sinus rhythm. Rate 69 bpm. RBBB. Left axis deviation.  Recent Labs: 03/01/2021: ALT 20 06/27/2021: Hemoglobin 11.1; Platelets 236; TSH 1.990 01/24/2022: BUN 51; Creatinine 3.76; Potassium 4.1; Sodium 138   Recent Lipid Panel    Component Value Date/Time   CHOL 140 06/27/2021 0927   TRIG 172 (H) 06/27/2021 0927   HDL 29 (L) 06/27/2021 0927   CHOLHDL 4.8 06/27/2021 0927   CHOLHDL 5.2 (H) 10/23/2015 1009   VLDL 46 (H) 10/23/2015 1009   LDLCALC 81 06/27/2021 0927    Physical Exam:    VS:  There were no vitals taken for this visit. , BMI There is no height or weight on file to calculate BMI. GENERAL:  Well appearing HEENT: Pupils equal round and reactive, fundi not visualized, oral mucosa unremarkable NECK:  No jugular venous distention, waveform within normal limits, carotid upstroke brisk and symmetric, no bruits, no thyromegaly LYMPHATICS:  No cervical adenopathy LUNGS:  Clear to auscultation bilaterally HEART:  RRR.  PMI not displaced or sustained,S1 and S2 within normal limits, no S3, no S4, no clicks, no rubs, no murmurs ABD:  Flat, positive bowel sounds normal in frequency in pitch, no bruits, no rebound, no  guarding, no midline pulsatile mass, no hepatomegaly, no splenomegaly EXT:  2 plus pulses throughout, no edema, no cyanosis no clubbing SKIN:  No rashes no nodules NEURO:  Cranial nerves II through XII grossly intact, motor grossly intact throughout PSYCH:  Cognitively intact, oriented to person place and time  ASSESSMENT:    No diagnosis found.     PLAN:    No problem-specific Assessment & Plan notes found for this encounter.   Disposition: FU with Brittanny Levenhagen C. Oval Linsey, MD, [facility] in 1 year  Medication Adjustments/Labs  and Tests Ordered: Current medicines are reviewed at length with the patient today.  Concerns regarding medicines are outlined above.  No orders of the defined types were placed in this encounter.    No orders of the defined types were placed in this encounter.    Signed, Skeet Latch, MD  01/28/2022 7:55 AM    [place]

## 2022-01-29 DIAGNOSIS — E1129 Type 2 diabetes mellitus with other diabetic kidney complication: Secondary | ICD-10-CM | POA: Diagnosis not present

## 2022-01-29 DIAGNOSIS — M109 Gout, unspecified: Secondary | ICD-10-CM | POA: Diagnosis not present

## 2022-01-29 DIAGNOSIS — E785 Hyperlipidemia, unspecified: Secondary | ICD-10-CM | POA: Diagnosis not present

## 2022-01-29 DIAGNOSIS — I129 Hypertensive chronic kidney disease with stage 1 through stage 4 chronic kidney disease, or unspecified chronic kidney disease: Secondary | ICD-10-CM | POA: Diagnosis not present

## 2022-01-29 DIAGNOSIS — N184 Chronic kidney disease, stage 4 (severe): Secondary | ICD-10-CM | POA: Diagnosis not present

## 2022-01-29 DIAGNOSIS — C20 Malignant neoplasm of rectum: Secondary | ICD-10-CM | POA: Diagnosis not present

## 2022-01-29 DIAGNOSIS — I509 Heart failure, unspecified: Secondary | ICD-10-CM | POA: Diagnosis not present

## 2022-01-31 ENCOUNTER — Other Ambulatory Visit: Payer: Self-pay

## 2022-01-31 MED ORDER — OLMESARTAN MEDOXOMIL 5 MG PO TABS
10.0000 mg | ORAL_TABLET | Freq: Every day | ORAL | 6 refills | Status: DC
  Filled 2022-01-31: qty 60, 30d supply, fill #0

## 2022-01-31 MED ORDER — CARVEDILOL 6.25 MG PO TABS
6.2500 mg | ORAL_TABLET | Freq: Two times a day (BID) | ORAL | 6 refills | Status: DC
  Filled 2022-01-31: qty 60, 30d supply, fill #0

## 2022-02-01 ENCOUNTER — Other Ambulatory Visit: Payer: Self-pay

## 2022-02-03 ENCOUNTER — Ambulatory Visit (HOSPITAL_BASED_OUTPATIENT_CLINIC_OR_DEPARTMENT_OTHER)
Admission: RE | Admit: 2022-02-03 | Discharge: 2022-02-03 | Disposition: A | Discharge status: Home / Self Care | Payer: Medicaid Other | Source: Ambulatory Visit | Attending: Nurse Practitioner | Admitting: Nurse Practitioner

## 2022-02-03 DIAGNOSIS — C20 Malignant neoplasm of rectum: Secondary | ICD-10-CM

## 2022-02-03 DIAGNOSIS — J984 Other disorders of lung: Secondary | ICD-10-CM | POA: Diagnosis not present

## 2022-02-03 DIAGNOSIS — N281 Cyst of kidney, acquired: Secondary | ICD-10-CM | POA: Diagnosis not present

## 2022-02-06 ENCOUNTER — Other Ambulatory Visit: Payer: Self-pay

## 2022-02-08 ENCOUNTER — Telehealth: Payer: Self-pay

## 2022-02-08 ENCOUNTER — Other Ambulatory Visit: Payer: Self-pay | Admitting: Nurse Practitioner

## 2022-02-08 ENCOUNTER — Other Ambulatory Visit: Payer: Self-pay

## 2022-02-08 DIAGNOSIS — C20 Malignant neoplasm of rectum: Secondary | ICD-10-CM

## 2022-02-08 NOTE — Telephone Encounter (Signed)
-----   Message from Ladell Pier, MD sent at 02/07/2022  8:36 PM EST ----- Please call patient, CTs show no evidence of recurrent rectal cancer, there are changes of CHF, small chest nodes likely related to CHF, repeat ct chest no contrast 4 months

## 2022-02-08 NOTE — Telephone Encounter (Signed)
Patient gave verbal understanding and had no further questions or concerns  

## 2022-03-03 ENCOUNTER — Emergency Department (HOSPITAL_COMMUNITY)
Admission: EM | Admit: 2022-03-03 | Discharge: 2022-03-03 | Discharge status: Against Medical Advice | Payer: Medicaid Other | Attending: Emergency Medicine | Admitting: Emergency Medicine

## 2022-03-03 ENCOUNTER — Encounter (HOSPITAL_COMMUNITY): Payer: Self-pay

## 2022-03-03 ENCOUNTER — Other Ambulatory Visit: Payer: Self-pay

## 2022-03-03 ENCOUNTER — Emergency Department (HOSPITAL_COMMUNITY): Payer: Medicaid Other

## 2022-03-03 DIAGNOSIS — R059 Cough, unspecified: Secondary | ICD-10-CM | POA: Diagnosis not present

## 2022-03-03 DIAGNOSIS — J45909 Unspecified asthma, uncomplicated: Secondary | ICD-10-CM | POA: Insufficient documentation

## 2022-03-03 DIAGNOSIS — Z5321 Procedure and treatment not carried out due to patient leaving prior to being seen by health care provider: Secondary | ICD-10-CM | POA: Diagnosis not present

## 2022-03-03 DIAGNOSIS — R0602 Shortness of breath: Secondary | ICD-10-CM | POA: Diagnosis not present

## 2022-03-03 DIAGNOSIS — J189 Pneumonia, unspecified organism: Secondary | ICD-10-CM | POA: Diagnosis not present

## 2022-03-03 LAB — CBC WITH DIFFERENTIAL/PLATELET
Abs Immature Granulocytes: 0.04 10*3/uL (ref 0.00–0.07)
Basophils Absolute: 0.1 10*3/uL (ref 0.0–0.1)
Basophils Relative: 1 %
Eosinophils Absolute: 0.4 10*3/uL (ref 0.0–0.5)
Eosinophils Relative: 4 %
HCT: 32.6 % — ABNORMAL LOW (ref 39.0–52.0)
Hemoglobin: 9.7 g/dL — ABNORMAL LOW (ref 13.0–17.0)
Immature Granulocytes: 0 %
Lymphocytes Relative: 8 %
Lymphs Abs: 0.8 10*3/uL (ref 0.7–4.0)
MCH: 27.8 pg (ref 26.0–34.0)
MCHC: 29.8 g/dL — ABNORMAL LOW (ref 30.0–36.0)
MCV: 93.4 fL (ref 80.0–100.0)
Monocytes Absolute: 1 10*3/uL (ref 0.1–1.0)
Monocytes Relative: 10 %
Neutro Abs: 7.7 10*3/uL (ref 1.7–7.7)
Neutrophils Relative %: 77 %
Platelets: 214 10*3/uL (ref 150–400)
RBC: 3.49 MIL/uL — ABNORMAL LOW (ref 4.22–5.81)
RDW: 17 % — ABNORMAL HIGH (ref 11.5–15.5)
WBC: 9.9 10*3/uL (ref 4.0–10.5)
nRBC: 0 % (ref 0.0–0.2)

## 2022-03-03 LAB — BASIC METABOLIC PANEL
Anion gap: 9 (ref 5–15)
BUN: 55 mg/dL — ABNORMAL HIGH (ref 6–20)
CO2: 20 mmol/L — ABNORMAL LOW (ref 22–32)
Calcium: 7.2 mg/dL — ABNORMAL LOW (ref 8.9–10.3)
Chloride: 109 mmol/L (ref 98–111)
Creatinine, Ser: 3.81 mg/dL — ABNORMAL HIGH (ref 0.61–1.24)
GFR, Estimated: 18 mL/min — ABNORMAL LOW (ref >60)
Glucose, Bld: 127 mg/dL — ABNORMAL HIGH (ref 70–99)
Potassium: 4.4 mmol/L (ref 3.5–5.1)
Sodium: 138 mmol/L (ref 135–145)

## 2022-03-03 LAB — BRAIN NATRIURETIC PEPTIDE: B Natriuretic Peptide: 642.8 pg/mL — ABNORMAL HIGH (ref 0.0–100.0)

## 2022-03-03 MED ORDER — PREDNISONE 20 MG PO TABS
60.0000 mg | ORAL_TABLET | Freq: Once | ORAL | Status: AC
  Administered 2022-03-03: 60 mg via ORAL
  Filled 2022-03-03: qty 3

## 2022-03-03 MED ORDER — IPRATROPIUM-ALBUTEROL 0.5-2.5 (3) MG/3ML IN SOLN: 3.0000 mL | Freq: Once | RESPIRATORY_TRACT | Status: DC

## 2022-03-03 NOTE — ED Notes (Signed)
Patient did not respond for room x3

## 2022-03-03 NOTE — ED Provider Triage Note (Signed)
Emergency Medicine Provider Triage Evaluation Note  Craig West , a 56 y.o. male  was evaluated in triage.  Pt complains of difficulty breathing onset yesterday, hx asthma, using inhaler without relief.  Review of Systems  Positive: Dry cough, wheezing, SHOB Negative: Fever, lower ext edema  Physical Exam  BP (!) 156/86 (BP Location: Right Arm)   Pulse 66   Temp 98.3 F (36.8 C) (Oral)   Resp 16   Ht '6\' 1"'$  (1.854 m)   Wt 95.7 kg   SpO2 97%   BMI 27.84 kg/m  Gen:   Awake, no distress   Resp:  Speaks in short phrases, minimal wheezing  MSK:   Moves extremities without difficulty  Other:    Medical Decision Making  Medically screening exam initiated at 9:46 AM.  Appropriate orders placed.  Craig West was informed that the remainder of the evaluation will be completed by another provider, this initial triage assessment does not replace that evaluation, and the importance of remaining in the ED until their evaluation is complete.     Tacy Learn, PA-C 03/03/22 [phone]

## 2022-03-03 NOTE — ED Triage Notes (Signed)
Reports hx of asthma and sob started yesterday and has used rescue inhalers with no relief.

## 2022-03-06 ENCOUNTER — Ambulatory Visit (INDEPENDENT_AMBULATORY_CARE_PROVIDER_SITE_OTHER): Payer: Medicaid Other

## 2022-03-06 ENCOUNTER — Ambulatory Visit (HOSPITAL_COMMUNITY)
Admission: EM | Admit: 2022-03-06 | Discharge: 2022-03-06 | Disposition: A | Discharge status: Home / Self Care | Payer: Medicaid Other | Attending: Physician Assistant | Admitting: Physician Assistant

## 2022-03-06 ENCOUNTER — Other Ambulatory Visit: Payer: Self-pay

## 2022-03-06 ENCOUNTER — Encounter (HOSPITAL_COMMUNITY): Payer: Self-pay | Admitting: Emergency Medicine

## 2022-03-06 DIAGNOSIS — I5032 Chronic diastolic (congestive) heart failure: Secondary | ICD-10-CM

## 2022-03-06 DIAGNOSIS — J189 Pneumonia, unspecified organism: Secondary | ICD-10-CM | POA: Diagnosis not present

## 2022-03-06 DIAGNOSIS — R062 Wheezing: Secondary | ICD-10-CM | POA: Diagnosis not present

## 2022-03-06 DIAGNOSIS — I110 Hypertensive heart disease with heart failure: Secondary | ICD-10-CM | POA: Diagnosis not present

## 2022-03-06 DIAGNOSIS — R0602 Shortness of breath: Secondary | ICD-10-CM

## 2022-03-06 MED ORDER — BUDESONIDE-FORMOTEROL FUMARATE 80-4.5 MCG/ACT IN AERO
2.0000 | INHALATION_SPRAY | Freq: Two times a day (BID) | RESPIRATORY_TRACT | 12 refills | Status: DC
  Filled 2022-03-06: qty 10.2, 30d supply, fill #0

## 2022-03-06 MED ORDER — ALBUTEROL SULFATE (2.5 MG/3ML) 0.083% IN NEBU
2.5000 mg | INHALATION_SOLUTION | Freq: Once | RESPIRATORY_TRACT | Status: AC
  Administered 2022-03-06: 2.5 mg via RESPIRATORY_TRACT

## 2022-03-06 MED ORDER — SPACER/AERO-HOLD CHAMBER BAGS MISC
1.0000 [IU] | Freq: Two times a day (BID) | 0 refills | Status: DC
  Filled 2022-03-06: qty 1, fill #0

## 2022-03-06 MED ORDER — FUROSEMIDE 20 MG PO TABS
60.0000 mg | ORAL_TABLET | Freq: Two times a day (BID) | ORAL | 2 refills | Status: DC
  Filled 2022-03-06: qty 180, 30d supply, fill #0
  Filled 2022-08-08: qty 180, 30d supply, fill #1

## 2022-03-06 MED ORDER — PREDNISONE 10 MG PO TABS
10.0000 mg | ORAL_TABLET | Freq: Three times a day (TID) | ORAL | 0 refills | Status: DC
  Filled 2022-03-06: qty 15, 5d supply, fill #0

## 2022-03-06 MED ORDER — AZITHROMYCIN 250 MG PO TABS
ORAL_TABLET | ORAL | 0 refills | Status: AC
  Filled 2022-03-06: qty 6, 5d supply, fill #0

## 2022-03-06 MED ORDER — ALBUTEROL SULFATE HFA 108 (90 BASE) MCG/ACT IN AERS
1.0000 | INHALATION_SPRAY | Freq: Four times a day (QID) | RESPIRATORY_TRACT | 2 refills | Status: AC | PRN
  Filled 2022-03-06: qty 18, 25d supply, fill #0

## 2022-03-06 MED ORDER — ALBUTEROL SULFATE (2.5 MG/3ML) 0.083% IN NEBU
INHALATION_SOLUTION | RESPIRATORY_TRACT | Status: AC
  Filled 2022-03-06: qty 3

## 2022-03-06 MED ORDER — ALBUTEROL SULFATE (2.5 MG/3ML) 0.083% IN NEBU
2.5000 mg | INHALATION_SOLUTION | Freq: Four times a day (QID) | RESPIRATORY_TRACT | 12 refills | Status: DC | PRN
  Filled 2022-03-06: qty 90, 8d supply, fill #0

## 2022-03-06 NOTE — ED Triage Notes (Signed)
Sob for 3-4 days.  Patient is nonspecific about any uri symptoms.  Patient last had inhaler yesterday.  Patient has not been able to cough up any phlegm.  Reports sob with lying flat and /or activity.  Currently speaking in complete sentences.  Patient appears to have lower extremity edema, patient agrees he thinks there is some swelling.  Denies pain.

## 2022-03-06 NOTE — Discharge Instructions (Addendum)
Advised to follow-up with your PCP over the next several days in order to be reevaluated for your wheezing and shortness of breath. Advised to use the Serevent inhaler, 2 puffs every 12 hours on a regular basis with a spacer to help decrease wheezing and shortness of breath.  Advised take the prednisone 10 mg, 1 3 times a day until completed to help decrease the wheezing and shortness of breath.  (Be aware this will increase your glucose level over the next several days)  Advised to restart your blood pressure medicines tonight and continue to take all medicines as directed as this will help decrease your shortness of breath and wheezing also.  Advised to take the Lasix as directed to ensure that any fluid retention is resolved.  Advised take the Zithromax 250 mg, 2 tablets initially and then 1 daily until completed to treat the pneumonia.  And urgent care as needed.

## 2022-03-06 NOTE — ED Provider Notes (Signed)
[place]    CSN: [id number] Arrival date & time: 03/06/22  1112      History   Chief Complaint Chief Complaint  Patient presents with   Shortness of Breath    HPI Craig West is a 56 y.o. male.   56 year old male presents with shortness of breath.  Patient indicates for the past 2 to 3 days he has been having increasing episodes of shortness of breath.  Patient relates that he is having wheezing associated with the shortness of breath.  He indicates he has been using his albuterol inhaler but has not gotten any relief from his difficulty breathing.  Patient indicates that he gets more shortness of breath when he tries to lie flat but also when he tries to do any type of activity.  Patient indicates that the breathing is better when he was just standing upright and not doing any activity.  Patient indicates that he is taking his blood pressure medicine on a regular basis however he did not take it today and his blood pressure is considerably elevated.  Patient relates he is not having any headache, or chest discomfort or tightness due to the elevated blood pressure.  Patient also indicates that he has Lasix that he takes on a regular basis for swelling.  He does indicate he had some mild bilateral lower leg swelling present several days ago but it resolved.  Patient does have a history of having diastolic heart failure and also has diabetes although he denies having diabetes in the exam room.  The patient denies having fever, chills, nausea or vomiting.  The patient is currently not on ACE or ARB and is not on a combination bronchodilator/steroid inhaler for his asthma.   Shortness of Breath   Past Medical History:  Diagnosis Date   Asthma    CHF (congestive heart failure) ([place])    CKD (chronic kidney disease), stage IV ([place])    COVID-19 virus infection 04/2019   Diabetes mellitus without complication (HCC)    Fatigue 11/23/2020   Gout    Hypertension    Leg heaviness  11/23/2020   NSVT (nonsustained ventricular tachycardia) ([place]) 11/23/2020   Rectal cancer ([place])     Patient Active Problem List   Diagnosis Date Noted   BPH (benign prostatic hyperplasia) 03/27/2021   Fatigue 11/23/2020   Leg heaviness 11/23/2020   NSVT (nonsustained ventricular tachycardia) ([place]) 11/23/2020   Diabetes mellitus without complication (HCC)    CKD (chronic kidney disease), stage IV ([place])    PICC (peripherally inserted central catheter) in place 03/27/2020   Rectal cancer ([place]) 01/27/2020   Benign neoplasm of ascending colon    Benign neoplasm of descending colon    Benign neoplasm of sigmoid colon    Rectal mass    Rectal bleeding    Iron deficiency anemia    Chronic diastolic heart failure (HCC)    Hyperkalemia    Volume overload 01/06/2020   Demand ischemia    COVID-19 virus infection 04/2019   Obesity (BMI 30.0-34.9) 12/09/2017   Non compliance w medication regimen 04/18/2017   Gout 08/02/2016   Diabetic neuropathy ([place]) 08/02/2016   Hyperlipidemia 10/24/2015   Asthma 10/16/2015   Type 2 diabetes mellitus without complication ([place]) 62/83/6629   Essential hypertension 06/13/2014   Smoker 06/13/2014    Past Surgical History:  Procedure Laterality Date   BIOPSY  01/11/2020   Procedure: BIOPSY;  Surgeon: Irene Shipper, MD;  Location: [facility] ENDOSCOPY;  Service: Endoscopy;;  COLONOSCOPY WITH PROPOFOL N/A 01/11/2020   Procedure: COLONOSCOPY WITH PROPOFOL;  Surgeon: Irene Shipper, MD;  Location: [facility] ENDOSCOPY;  Service: Endoscopy;  Laterality: N/A;   NO PAST SURGERIES     POLYPECTOMY  01/11/2020   Procedure: POLYPECTOMY;  Surgeon: Irene Shipper, MD;  Location: [facility] ENDOSCOPY;  Service: Endoscopy;;   SUBMUCOSAL TATTOO INJECTION  01/11/2020   Procedure: SUBMUCOSAL TATTOO INJECTION;  Surgeon: Irene Shipper, MD;  Location: [facility] ENDOSCOPY;  Service: Endoscopy;;       Home Medications    Prior to Admission medications   Medication Sig Start Date End Date Taking?  Authorizing Provider  albuterol (PROVENTIL) (2.5 MG/3ML) 0.083% nebulizer solution Take 3 mLs (2.5 mg total) by nebulization every 6 (six) hours as needed for wheezing or shortness of breath. 03/06/22  Yes Nyoka Lint, PA-C  albuterol (VENTOLIN HFA) 108 (90 Base) MCG/ACT inhaler Inhale 1-2 puffs into the lungs every 6 (six) hours as needed for wheezing or shortness of breath. 03/06/22  Yes Nyoka Lint, PA-C  azithromycin (ZITHROMAX Z-PAK) 250 MG tablet Take 2 tablets (500 mg total) by mouth daily for 1 day, THEN 1 tablet (250 mg total) daily for 4 days. 03/06/22 03/11/22 Yes Nyoka Lint, PA-C  budesonide-formoterol [facility]) 80-4.5 MCG/ACT inhaler Inhale 2 puffs into the lungs 2 (two) times daily. 03/06/22  Yes Nyoka Lint, PA-C  predniSONE (DELTASONE) 10 MG tablet Take 1 tablet (10 mg total) by mouth 3 (three) times daily. 03/06/22  Yes Nyoka Lint, PA-C  Spacer/Aero-Hold Chamber Bags MISC Use 2 (two) times daily. 03/06/22  Yes Nyoka Lint, PA-C  allopurinol (ZYLOPRIM) 100 MG tablet Take 1 tablet (100 mg total) by mouth daily. 12/17/21   Charlott Rakes, MD  amLODipine (NORVASC) 10 MG tablet Take 1 tablet (10 mg total) by mouth daily. 12/17/21   Charlott Rakes, MD  atorvastatin (LIPITOR) 80 MG tablet Take 1 tablet (80 mg total) by mouth daily. 12/17/21   Charlott Rakes, MD  Blood Glucose Monitoring Suppl (TRUE METRIX METER) w/Device KIT Check blood sugar fasting and ant bedtime and record 05/15/17   Charlott Rakes, MD  carvedilol (COREG) 25 MG tablet Take 1 tablet (25 mg total) by mouth 2 (two) times daily with a meal. 12/17/21   Charlott Rakes, MD  carvedilol (COREG) 6.25 MG tablet Take 1 tablet (6.25 mg total) by mouth 2 (two) times daily. 01/31/22   Reesa Chew, MD  colchicine 0.6 MG tablet Take 1 tablet by mouth as needed. Take 1 tablet (0.6 mg) by mouth at the onset of a gout attack with next dose no sooner than 3 days later 12/21/21   Charlott Rakes, MD  furosemide (LASIX) 20 MG tablet  Take 3 tablets (60 mg total) by mouth 2 (two) times daily. 03/06/22   Nyoka Lint, PA-C  gabapentin (NEURONTIN) 300 MG capsule TAKE 1 CAPSULE (300 MG TOTAL) BY MOUTH 2 (TWO) TIMES DAILY. Patient taking differently: Take 300 mg by mouth 2 (two) times daily. 04/05/20 01/24/22  Argentina Donovan, PA-C  glucose blood (TRUE METRIX BLOOD GLUCOSE TEST) test strip Use as instructed 12/27/19   Andrew Au, MD  hydrALAZINE (APRESOLINE) 50 MG tablet Take 1 tablet (50 mg total) by mouth 2 (two) times daily. 12/17/21   Charlott Rakes, MD  Misc. Devices (DIGITAL GLASS SCALE) MISC Use scale to weight yourself. Increasing weight may indicate fluid overload 12/27/19   Andrew Au, MD  olmesartan (BENICAR) 5 MG tablet Take 2 tablets (10 mg total) by mouth daily.  01/31/22   Reesa Chew, MD  tamsulosin (FLOMAX) 0.4 MG CAPS capsule Take 1 capsule (0.4 mg total) by mouth daily. 12/17/21   Charlott Rakes, MD  TRUEplus Lancets 28G MISC Check blood sugar fasting and at bedtime 12/27/19   Andrew Au, MD    Family History Family History  Problem Relation Age of Onset   Heart failure Brother     Social History Social History   Tobacco Use   Smoking status: Former    Packs/day: 0.50    Types: Cigarettes    Quit date: 10/27/2019    Years since quitting: 2.3   Smokeless tobacco: Never  Vaping Use   Vaping Use: Never used  Substance Use Topics   Alcohol use: Yes    Alcohol/week: 0.0 standard drinks of alcohol    Comment: occ   Drug use: Yes    Types: Marijuana     Allergies   Patient has no known allergies.   Review of Systems Review of Systems  Respiratory:  Positive for shortness of breath.      Physical Exam Triage Vital Signs ED Triage Vitals  Enc Vitals Group     BP 03/06/22 1318 (!) 220/131     Pulse Rate 03/06/22 1318 69     Resp 03/06/22 1320 (!) 24     Temp 03/06/22 1320 98.1 F (36.7 C)     Temp src --      SpO2 03/06/22 1320 96 %     Weight --      Height --      Head  Circumference --      Peak Flow --      Pain Score 03/06/22 1315 0     Pain Loc --      Pain Edu? --      Excl. in [place]? --    No data found.  Updated Vital Signs BP (!) 214/128 (BP Location: Left Arm)   Pulse 79   Temp 98.1 F (36.7 C)   Resp (!) 24   SpO2 96%   Visual Acuity Right Eye Distance:   Left Eye Distance:   Bilateral Distance:    Right Eye Near:   Left Eye Near:    Bilateral Near:     Physical Exam Constitutional:      Appearance: He is well-developed.  HENT:     Mouth/Throat:     Mouth: Mucous membranes are moist.     Pharynx: Oropharynx is clear.  Cardiovascular:     Rate and Rhythm: Normal rate and regular rhythm.     Heart sounds: Normal heart sounds.  Pulmonary:     Effort: Pulmonary effort is normal.     Breath sounds: Examination of the right-lower field reveals wheezing. Examination of the left-lower field reveals wheezing. Wheezing (mild) present.  Musculoskeletal:     Comments: Extremities: There is no lower extremity swelling present bilaterally.  Lymphadenopathy:     Cervical: No cervical adenopathy.  Neurological:     Mental Status: He is alert.      UC Treatments / Results  Labs (all labs ordered are listed, but only abnormal results are displayed) Labs Reviewed - No data to display  EKG   Radiology DG Chest 2 View  Result Date: 03/06/2022 CLINICAL DATA:  Shortness of breath EXAM: CHEST - 2 VIEW COMPARISON:  Chest x-ray dated March 03, 2022 FINDINGS: Cardiac and mediastinal contours are within normal limits. Lower lung predominant consolidations, decreased when compared with prior exam.  No pleural effusion or pneumothorax. IMPRESSION: Lower lung predominant consolidations, decreased when compared with prior exam and likely due to resolving pneumonia pulmonary edema. Recommend follow-up PA and lateral chest x-ray in 3-4 weeks to ensure complete resolution. Electronically Signed   By: Yetta Glassman M.D.   On: 03/06/2022 14:14     Procedures Procedures (including critical care time)  Medications Ordered in UC Medications  albuterol (PROVENTIL) (2.5 MG/3ML) 0.083% nebulizer solution 2.5 mg (2.5 mg Nebulization Given 03/06/22 1404)    Initial Impression / Assessment and Plan / UC Course  I have reviewed the triage vital signs and the nursing notes.  Pertinent labs & imaging results that were available during my care of the patient were reviewed by me and considered in my medical decision making (see chart for details).    Plan: 1.  The shortness of breath will be treated with the following: A.  Albuterol inhaler 0.083% breathing treatment given in the office. B.  Albuterol inhaler to be used every 6 hours on a regular basis for acute wheezing and shortness of breath. C.  Symbicort 80/4.5, 2 puffs every 12 hours to help prevent wheezing and shortness of breath from occurring due to an asthma [place]. D.  Prednisone 10 mg, 1 3 times a day for 5 days only to treat the wheezing and inflammatory process. 2.  The wheezing will be treated with the following: A.  Albuterol inhaler 0.083% breathing treatment given in the office. 3.  The chronic diastolic heart failure with acute exacerbation will be treated for the following: A.  Patient advised to take his Lasix 60 mg a day to help reduce fluid retention. 4.  The pneumonia will be treated with the following: A.  Zithromax 250 mg, 2 tablets initially and then 1 daily until completed. 5.  Patient advised to follow-up with his PCP for evaluation of symptoms if it fails to improve over the next couple days.  Advised patient to have a repeat chest x-ray in 4 weeks to ensure that the pneumonia and pulmonary edema has resolved. Final Clinical Impressions(s) / UC Diagnoses   Final diagnoses:  SOB (shortness of breath)  Wheezing  Chronic diastolic heart failure (HCC)  Pneumonia of both lower lobes due to infectious organism     Discharge Instructions      Advised to  follow-up with your PCP over the next several days in order to be reevaluated for your wheezing and shortness of breath. Advised to use the Serevent inhaler, 2 puffs every 12 hours on a regular basis with a spacer to help decrease wheezing and shortness of breath.  Advised take the prednisone 10 mg, 1 3 times a day until completed to help decrease the wheezing and shortness of breath.  (Be aware this will increase your glucose level over the next several days)  Advised to restart your blood pressure medicines tonight and continue to take all medicines as directed as this will help decrease your shortness of breath and wheezing also.  Advised to take the Lasix as directed to ensure that any fluid retention is resolved.  Advised take the Zithromax 250 mg, 2 tablets initially and then 1 daily until completed to treat the pneumonia.  And urgent care as needed.      ED Prescriptions     Medication Sig Dispense Auth. Provider   furosemide (LASIX) 20 MG tablet Take 3 tablets (60 mg total) by mouth 2 (two) times daily. 180 tablet Nyoka Lint, PA-C   albuterol (VENTOLIN  HFA) 108 (90 Base) MCG/ACT inhaler Inhale 1-2 puffs into the lungs every 6 (six) hours as needed for wheezing or shortness of breath. 18 g Nyoka Lint, PA-C   budesonide-formoterol [facility]) 80-4.5 MCG/ACT inhaler Inhale 2 puffs into the lungs 2 (two) times daily. 10.2 g Nyoka Lint, PA-C   Spacer/Aero-Hold Chamber Bags MISC Use 2 (two) times daily. 1 Units Nyoka Lint, PA-C   predniSONE (DELTASONE) 10 MG tablet Take 1 tablet (10 mg total) by mouth 3 (three) times daily. 15 tablet Nyoka Lint, PA-C   azithromycin (ZITHROMAX Z-PAK) 250 MG tablet Take 2 tablets (500 mg total) by mouth daily for 1 day, THEN 1 tablet (250 mg total) daily for 4 days. 6 tablet Nyoka Lint, PA-C   albuterol (PROVENTIL) (2.5 MG/3ML) 0.083% nebulizer solution Take 3 mLs (2.5 mg total) by nebulization every 6 (six) hours as needed for wheezing or  shortness of breath. 75 mL Nyoka Lint, PA-C      PDMP not reviewed this encounter.   Nyoka Lint, PA-C 03/06/22 1441

## 2022-03-07 ENCOUNTER — Other Ambulatory Visit: Payer: Self-pay

## 2022-03-26 ENCOUNTER — Ambulatory Visit (HOSPITAL_COMMUNITY)
Admission: EM | Admit: 2022-03-26 | Discharge: 2022-03-26 | Disposition: A | Discharge status: Home / Self Care | Payer: Medicaid Other | Attending: Emergency Medicine | Admitting: Emergency Medicine

## 2022-03-26 ENCOUNTER — Encounter (HOSPITAL_COMMUNITY): Payer: Self-pay

## 2022-03-26 DIAGNOSIS — I509 Heart failure, unspecified: Secondary | ICD-10-CM

## 2022-03-26 DIAGNOSIS — M7989 Other specified soft tissue disorders: Secondary | ICD-10-CM | POA: Diagnosis not present

## 2022-03-26 DIAGNOSIS — I110 Hypertensive heart disease with heart failure: Secondary | ICD-10-CM

## 2022-03-26 DIAGNOSIS — I5032 Chronic diastolic (congestive) heart failure: Secondary | ICD-10-CM | POA: Diagnosis not present

## 2022-03-26 DIAGNOSIS — I10 Essential (primary) hypertension: Secondary | ICD-10-CM

## 2022-03-26 MED ORDER — ACETAMINOPHEN 325 MG PO TABS
ORAL_TABLET | ORAL | Status: AC
  Filled 2022-03-26: qty 2

## 2022-03-26 MED ORDER — ACETAMINOPHEN 325 MG PO TABS
650.0000 mg | ORAL_TABLET | Freq: Once | ORAL | Status: AC
  Administered 2022-03-26: 650 mg via ORAL

## 2022-03-26 NOTE — ED Triage Notes (Signed)
Pt is here for painful and swollen feet x2days

## 2022-03-26 NOTE — ED Provider Notes (Signed)
[place]    CSN: [id number] Arrival date & time: 03/26/22  1155     History   Chief Complaint Chief Complaint  Patient presents with   Foot Swelling    HPI Craig West is a 56 y.o. male.  Presents with 2 day history of bilateral foot swelling Reports 10/10 pain in both feet Denies shortness of breath or chest pain Otherwise "feels great" No interventions tired.  History of CHF, HTN, DM, CKD Takes lasix, amlodipine and carvedilol Reports he has taken his medications today. Denies headaches, vision changes, dizziness, abdominal pain.  Past Medical History:  Diagnosis Date   Asthma    CHF (congestive heart failure) ([place])    CKD (chronic kidney disease), stage IV ([place])    COVID-19 virus infection 04/2019   Diabetes mellitus without complication (HCC)    Fatigue 11/23/2020   Gout    Hypertension    Leg heaviness 11/23/2020   NSVT (nonsustained ventricular tachycardia) ([place]) 11/23/2020   Rectal cancer [facility])     Patient Active Problem List   Diagnosis Date Noted   BPH (benign prostatic hyperplasia) 03/27/2021   Fatigue 11/23/2020   Leg heaviness 11/23/2020   NSVT (nonsustained ventricular tachycardia) ([place]) 11/23/2020   Diabetes mellitus without complication ([place])    CKD (chronic kidney disease), stage IV ([place])    PICC (peripherally inserted central catheter) in place 03/27/2020   Rectal cancer ([place]) 01/27/2020   Benign neoplasm of ascending colon    Benign neoplasm of descending colon    Benign neoplasm of sigmoid colon    Rectal mass    Rectal bleeding    Iron deficiency anemia    Chronic diastolic heart failure (HCC)    Hyperkalemia    Volume overload 01/06/2020   Demand ischemia    COVID-19 virus infection 04/2019   Obesity (BMI 30.0-34.9) 12/09/2017   Non compliance w medication regimen 04/18/2017   Gout 08/02/2016   Diabetic neuropathy ([place]) 08/02/2016   Hyperlipidemia 10/24/2015   Asthma 10/16/2015   Type 2 diabetes mellitus without  complication ([place]) 05/12/1550   Essential hypertension 06/13/2014   Smoker 06/13/2014    Past Surgical History:  Procedure Laterality Date   BIOPSY  01/11/2020   Procedure: BIOPSY;  Surgeon: Irene Shipper, MD;  Location: [place];  Service: Endoscopy;;   COLONOSCOPY WITH PROPOFOL N/A 01/11/2020   Procedure: COLONOSCOPY WITH PROPOFOL;  Surgeon: Irene Shipper, MD;  Location: [facility] ENDOSCOPY;  Service: Endoscopy;  Laterality: N/A;   NO PAST SURGERIES     POLYPECTOMY  01/11/2020   Procedure: POLYPECTOMY;  Surgeon: Irene Shipper, MD;  Location: [facility] ENDOSCOPY;  Service: Endoscopy;;   SUBMUCOSAL TATTOO INJECTION  01/11/2020   Procedure: SUBMUCOSAL TATTOO INJECTION;  Surgeon: Irene Shipper, MD;  Location: [facility] ENDOSCOPY;  Service: Endoscopy;;   Home Medications    Prior to Admission medications   Medication Sig Start Date End Date Taking? Authorizing Provider  albuterol (PROVENTIL) (2.5 MG/3ML) 0.083% nebulizer solution Take 3 mLs (2.5 mg total) by nebulization every 6 (six) hours as needed for wheezing or shortness of breath. 03/06/22   Nyoka Lint, PA-C  albuterol (VENTOLIN HFA) 108 (90 Base) MCG/ACT inhaler Inhale 1-2 puffs into the lungs every 6 (six) hours as needed for wheezing or shortness of breath. 03/06/22   Nyoka Lint, PA-C  allopurinol (ZYLOPRIM) 100 MG tablet Take 1 tablet (100 mg total) by mouth daily. 12/17/21   Charlott Rakes, MD  amLODipine (NORVASC) 10 MG tablet Take 1 tablet (10  mg total) by mouth daily. 12/17/21   Charlott Rakes, MD  atorvastatin (LIPITOR) 80 MG tablet Take 1 tablet (80 mg total) by mouth daily. 12/17/21   Charlott Rakes, MD  Blood Glucose Monitoring Suppl (TRUE METRIX METER) w/Device KIT Check blood sugar fasting and ant bedtime and record 05/15/17   Charlott Rakes, MD  budesonide-formoterol (SYMBICORT) 80-4.5 MCG/ACT inhaler Inhale 2 puffs into the lungs 2 (two) times daily. 03/06/22   Nyoka Lint, PA-C  carvedilol (COREG) 25 MG tablet Take 1 tablet (25 mg  total) by mouth 2 (two) times daily with a meal. 12/17/21   Charlott Rakes, MD  carvedilol (COREG) 6.25 MG tablet Take 1 tablet (6.25 mg total) by mouth 2 (two) times daily. 01/31/22   Reesa Chew, MD  colchicine 0.6 MG tablet Take 1 tablet by mouth as needed. Take 1 tablet (0.6 mg) by mouth at the onset of a gout attack with next dose no sooner than 3 days later 12/21/21   Charlott Rakes, MD  furosemide (LASIX) 20 MG tablet Take 3 tablets (60 mg total) by mouth 2 (two) times daily. 03/06/22   Nyoka Lint, PA-C  gabapentin (NEURONTIN) 300 MG capsule TAKE 1 CAPSULE (300 MG TOTAL) BY MOUTH 2 (TWO) TIMES DAILY. Patient taking differently: Take 300 mg by mouth 2 (two) times daily. 04/05/20 01/24/22  Argentina Donovan, PA-C  glucose blood (TRUE METRIX BLOOD GLUCOSE TEST) test strip Use as instructed 12/27/19   Andrew Au, MD  hydrALAZINE (APRESOLINE) 50 MG tablet Take 1 tablet (50 mg total) by mouth 2 (two) times daily. 12/17/21   Charlott Rakes, MD  Misc. Devices (DIGITAL GLASS SCALE) MISC Use scale to weight yourself. Increasing weight may indicate fluid overload 12/27/19   Andrew Au, MD  olmesartan (BENICAR) 5 MG tablet Take 2 tablets (10 mg total) by mouth daily. 01/31/22   Reesa Chew, MD  predniSONE (DELTASONE) 10 MG tablet Take 1 tablet (10 mg total) by mouth 3 (three) times daily. 03/06/22   Nyoka Lint, PA-C  Spacer/Aero-Hold Chamber Bags MISC Use 2 (two) times daily. 03/06/22   Nyoka Lint, PA-C  tamsulosin (FLOMAX) 0.4 MG CAPS capsule Take 1 capsule (0.4 mg total) by mouth daily. 12/17/21   Charlott Rakes, MD  TRUEplus Lancets 28G MISC Check blood sugar fasting and at bedtime 12/27/19   Andrew Au, MD    Family History Family History  Problem Relation Age of Onset   Heart failure Brother     Social History Social History   Tobacco Use   Smoking status: Former    Packs/day: 0.50    Types: Cigarettes    Quit date: 10/27/2019    Years since quitting: 2.4   Smokeless  tobacco: Never  Vaping Use   Vaping Use: Never used  Substance Use Topics   Alcohol use: Yes    Alcohol/week: 0.0 standard drinks of alcohol    Comment: occ   Drug use: Yes    Types: Marijuana     Allergies   Patient has no known allergies.   Review of Systems Review of Systems  As per HPI  Physical Exam Triage Vital Signs ED Triage Vitals [03/26/22 1440]  Enc Vitals Group     BP      Pulse      Resp      Temp      Temp src      SpO2      Weight      Height  Head Circumference      Peak Flow      Pain Score 10     Pain Loc      Pain Edu?      Excl. in [place]?    No data found.  Updated Vital Signs BP (!) 167/118 (BP Location: Left Arm)   Pulse 82   Temp 98 F (36.7 C) (Oral)   Resp 16   SpO2 95%    Physical Exam Vitals and nursing note reviewed.  Constitutional:      Appearance: Normal appearance.  Eyes:     Extraocular Movements: Extraocular movements intact.     Conjunctiva/sclera: Conjunctivae normal.     Pupils: Pupils are equal, round, and reactive to light.  Cardiovascular:     Rate and Rhythm: Normal rate and regular rhythm.     Pulses: Normal pulses.          Dorsalis pedis pulses are 2+ on the right side and 2+ on the left side.     Heart sounds:     Gallop present.     Comments: Pitting edema bilat to the mid/lower shin Musculoskeletal:     Right lower leg: 3+ Edema present.     Left lower leg: 3+ Edema present.     Comments: Decreased ROM due to swelling  Feet:     Right foot:     Skin integrity: Skin integrity normal. No ulcer.     Left foot:     Skin integrity: Skin integrity normal. No ulcer.  Neurological:     General: No focal deficit present.     Mental Status: He is alert and oriented to person, place, and time.    UC Treatments / Results  Labs (all labs ordered are listed, but only abnormal results are displayed) Labs Reviewed - No data to display  EKG  Radiology No results  found.  Procedures Procedures  Medications Ordered in UC Medications  acetaminophen (TYLENOL) tablet 650 mg (650 mg Oral Given 03/26/22 1517)    Initial Impression / Assessment and Plan / UC Course  I have reviewed the triage vital signs and the nursing notes.  Pertinent labs & imaging results that were available during my care of the patient were reviewed by me and considered in my medical decision making (see chart for details).  Placed patient on exam table with legs elevated.   Takes lasix 60 mg BID GFR as of 12/3 was 18. Do not see benefit of increasing lasix dose at this time. Recommend compression stockings and elevating the legs. Needs to follow with his cardiologist.  Additionally discussed HTN and taking all of his medications as prescribed. High pressure likely playing a role in the swelling. Today 172/111.  Patient wondering if there is a pill for his symptoms. We discussed again the multiple symptomatic care interventions and how all of them combined will work to reduce pain and swelling. Tylenol dose given for pain. Discussed with patient following up with PCP and cardiology. No sign of CHF exacerbation today. ED precautions.  Return precautions discussed. Patient agrees to plan  Final Clinical Impressions(s) / UC Diagnoses   Final diagnoses:  Bilateral swelling of feet  Congestive heart failure, unspecified HF chronicity, unspecified heart failure type ([place])  Hypertension, unspecified type     Discharge Instructions      Please continue the Lasix twice daily.  Elevate your legs as much as possible to reduce swelling.  Please wear compression stockings daily.  You can use  tylenol for pain.  Continue to take your blood pressure mediations daily as prescribed.  The combination of these interventions plus getting your blood pressure under control is the only treatment for your feet swelling.  Please make an appointment with your cardiologist or primary  care provider for follow up and evaluation.      ED Prescriptions   None    PDMP not reviewed this encounter.   Kyra Leyland 03/26/22 1537

## 2022-03-26 NOTE — Discharge Instructions (Addendum)
Please continue the Lasix twice daily.  Elevate your legs as much as possible to reduce swelling.  Please wear compression stockings daily.  You can use tylenol for pain.  Continue to take your blood pressure mediations daily as prescribed.  The combination of these interventions plus getting your blood pressure under control is the only treatment for your feet swelling.  Please make an appointment with your cardiologist or primary care provider for follow up and evaluation.

## 2022-03-30 IMAGING — CT NM PET TUM IMG INITIAL (PI) SKULL BASE T - THIGH
1 of 7 series · 1 of 25 positions shown · non-contrast
Comparison: CT scan 01/11/2020 and MRI pelvis 01/26/2020

CLINICAL DATA: Initial treatment strategy for rectal cancer.

EXAM:
NUCLEAR MEDICINE PET SKULL BASE TO THIGH
TECHNIQUE: 12.78 mCi F-18 FDG was injected intravenously. Full-ring PET imaging
was performed from the skull base to thigh after the radiotracer. CT
data was obtained and used for attenuation correction and anatomic
localization.
Fasting blood glucose: 125 mg/dl

[Series 4: ct sk_thigh 5.0 bf37 · axial · 5.0mm · 0.98mm/px · 1 of 238 slices shown]
[im 238/238  brain]
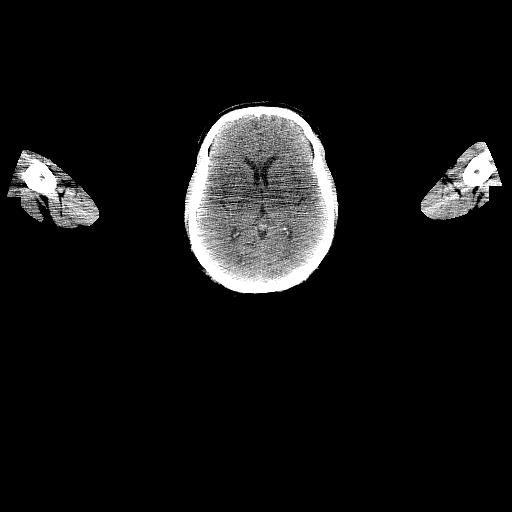

[1 of 25 positions shown; findings below may reference images not displayed]

FINDINGS: Mediastinal blood pool activity: SUV max

Liver activity: SUV max NA

NECK: No hypermetabolic lymph nodes in the neck.

Incidental CT findings: none

CHEST: No hypermetabolic mediastinal or hilar nodes. No suspicious
pulmonary nodules on the CT scan.

Incidental CT findings: Small bilateral pleural effusions, right
larger than left.

Mild cardiac enlargement.  Scattered coronary artery calcifications.

ABDOMEN/PELVIS: No PET-CT findings suspicious for hepatic metastatic
disease. The adrenal glands, pancreas and spleen are unremarkable.
No abdominal lymphadenopathy.

Fairly extensive rectosigmoid hypermetabolic neoplasm as
demonstrated on the MRI examination. This is markedly hypermetabolic
with SUV max of 15.37. The mesorectal and sigmoid mesocolon lymph
nodes demonstrated on the CT and MRI are hypermetabolic.

10 mm sigmoid mesocolon lymph node on image 178/4 has an SUV max of
6.46.

Slightly more superiorly there is an 8 mm sigmoid mesocolon lymph
node which has an SUV max of 4.67.

Several 4-5 mm mesorectal lymph nodes are mildly hypermetabolic with
index node on image 189/4 measuring 6 mm and having an SUV max of
[REDACTED] mm left lateral external iliac/obturator lymph node on image
191/4 has an SUV max of [REDACTED] mm left inguinal lymph node on image 214/4 has normal appearing
lymph node architecture the demonstrates slight hypermetabolism with
SUV max of 3.0. This is more likely inflammatory than neoplastic.

Incidental CT findings: none

SKELETON: Moderate areas of muscular uptake are noted in the chest,
abdomen and pelvis area but no worrisome bone lesions.

Incidental CT findings: none
IMPRESSION: 1. Hypermetabolic rectosigmoid colon mass consistent with known
neoplasm.
2. Hypermetabolic mesorectal, sigmoid mesocolon and left obturator
lymph nodes consistent with metastatic disease.
3. No evidence of metastatic disease elsewhere in the chest or
abdomen.

## 2022-04-05 ENCOUNTER — Ambulatory Visit (INDEPENDENT_AMBULATORY_CARE_PROVIDER_SITE_OTHER): Payer: Medicaid Other | Admitting: Family

## 2022-04-05 ENCOUNTER — Encounter (HOSPITAL_BASED_OUTPATIENT_CLINIC_OR_DEPARTMENT_OTHER): Payer: Self-pay | Admitting: Family

## 2022-04-05 VITALS — BP 138/90 | HR 72 | Ht 73.0 in | Wt 216.0 lb

## 2022-04-05 DIAGNOSIS — R0683 Snoring: Secondary | ICD-10-CM | POA: Diagnosis not present

## 2022-04-05 DIAGNOSIS — E782 Mixed hyperlipidemia: Secondary | ICD-10-CM | POA: Diagnosis not present

## 2022-04-05 DIAGNOSIS — R197 Diarrhea, unspecified: Secondary | ICD-10-CM

## 2022-04-05 DIAGNOSIS — I5032 Chronic diastolic (congestive) heart failure: Secondary | ICD-10-CM | POA: Diagnosis not present

## 2022-04-05 DIAGNOSIS — N184 Chronic kidney disease, stage 4 (severe): Secondary | ICD-10-CM

## 2022-04-05 DIAGNOSIS — I10 Essential (primary) hypertension: Secondary | ICD-10-CM | POA: Diagnosis not present
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
## 2022-04-05 NOTE — Patient Instructions (Addendum)
Medication Instructions:  Your Physician recommend you continue on your current medication as directed.    *If you need a refill on your cardiac medications before your next appointment, please call your pharmacy*  Testing/Procedures: WatchPAT?  Is a FDA cleared portable home sleep study test that uses a watch and 3 points of contact to monitor 7 different channels, including your heart rate, oxygen saturations, body position, snoring, and chest motion.  The study is easy to use from the comfort of your own home and accurately detect sleep apnea.  Before bed, you attach the chest sensor, attached the sleep apnea bracelet to your nondominant hand, and attach the finger probe.  After the study, the raw data is downloaded from the watch and scored for apnea events.   For more information: [email or web address]  Patient Testing Instructions:  Do not put battery into the device until bedtime when you are ready to begin the test. Please call the support number if you need assistance after following the instructions below: 24 hour support line- [phone] or [facility] support at [phone] (option 2)  Download the [employer] AmericanWatchPAT One" app through the google play store or App Store  Be sure to turn on or enable access to bluetooth in [place] on your smartphone/ device  Make sure no other bluetooth devices are on and within the vicinity of your smartphone/ device and WatchPAT watch during testing.  Make sure to leave your smart phone/ device plugged in and charging all night.  When ready for bed:  Follow the instructions step by step in the WatchPAT One App to activate the testing device. For additional instructions, including video instruction, visit the WatchPAT One video on Youtube. You can search for [place] One within Youtube (video is 4 minutes and 18 seconds) or enter: https://youtube/watch?[email or web address]_vbiwxE Please note: You will be prompted to enter a Pin to connect via  bluetooth when starting the test. The PIN will be assigned to you when you receive the test.  The device is disposable, but it recommended that you retain the device until you receive a call letting you know the study has been received and the results have been interpreted.  We will let you know if the study did not transmit to [place] properly after the test is completed. You do not need to call us to confirm the receipt of the test.  Please complete the test within 48 hours of receiving PIN.   Frequently Asked Questions:  What is Watch [place] one?  A single use fully disposable home sleep apnea testing device and will not need to be returned after completion.  What are the requirements to use WatchPAT one?  The be able to have a successful watchpat one sleep study, you should have your Watch pat one device, your smart phone, watch pat one app, your PIN number and Internet access What type of phone do I need?  You should have a smart phone that uses Android 5.1 and above or any Iphone with IOS 10 and above How can I download the WatchPAT one app?  Based on your device type search for WatchPAT one app either in google play for android devices or APP store for Iphone's Where will I get my PIN for the study?  Your PIN will be provided by your physician's office. It is used for authentication and if you lose/forget your PIN, please reach out to your providers office.  I do not have Internet at home. Can I do WatchPAT one study?  WatchPAT  One needs Internet connection throughout the night to be able to transmit the sleep data. You can use your home/local internet or your cellular's data package. However, it is always recommended to use home/local Internet. It is estimated that between 20MB-30MB will be used with each study.However, the application will be looking for 80MB space in the phone to start the study.  What happens if I lose internet or bluetooth connection?  During the internet disconnection, your  phone will not be able to transmit the sleep data. All the data, will be stored in your phone. As soon as the internet connection is back on, the phone will being sending the sleep data. During the bluetooth disconnection, WatchPAT one will not be able to to send the sleep data to your phone. Data will be kept in the [facility] one until two devices have bluetooth connection back on. As soon as the connection is back on, WatchPAT one will send the sleep data to the phone.  How long do I need to wear the WatchPAT one?  After you start the study, you should wear the device at least 6 hours.  How far should I keep my phone from the device?  During the night, your phone should be within 15 feet.  What happens if I leave the room for restroom or other reasons?  Leaving the room for any reason will not cause any problem. As soon as your get back to the room, both devices will reconnect and will continue to send the sleep data. Can I use my phone during the sleep study?  Yes, you can use your phone as usual during the study. But it is recommended to put your watchpat one on when you are ready to go to bed.  How will I get my study results?  A soon as you completed your study, your sleep data will be sent to the provider. They will then share the results with you when they are ready.    Follow-Up: At [facility], you and your health needs are our priority.  As part of our continuing mission to provide you with exceptional heart care, we have created designated Provider Care Teams.  These Care Teams include your primary Cardiologist (physician) and Advanced Practice Providers (APPs -  Physician Assistants and Nurse Practitioners) who all work together to provide you with the care you need, when you need it.  We recommend signing up for the patient portal called "MyChart".  Sign up information is provided on this After Visit Summary.  MyChart is used to connect with patients for Virtual Visits  (Telemedicine).  Patients are able to view lab/test results, encounter notes, upcoming appointments, etc.  Non-urgent messages can be sent to your provider as well.   To learn more about what you can do with MyChart, go to [email or web address].    Your next appointment:   2 month(s)  The format for your next appointment:   In Person  Provider:   Skeet Latch, MD or Laurann Montana, NP    Other Instructions To prevent or reduce lower extremity swelling: Eat a low salt diet. Salt makes the body hold onto extra fluid which causes swelling. Sit with legs elevated. For example, in the recliner or on an [place].  Wear knee-high compression stockings during the daytime. Ones labeled 15-20 mmHg provide good compression.  Heart Healthy Diet Recommendations: A low-salt diet is recommended. Meats should be grilled, baked, or boiled. Avoid fried foods. Focus on lean protein sources like  fish or chicken with vegetables and fruits. The American Heart Association is a Microbiologist!  American Heart Association Diet and Lifeystyle Recommendations   Drink less than 2 liters (64 ounces) of fluid per day.  Exercise recommendations: The American Heart Association recommends 150 minutes of moderate intensity exercise weekly. Try 30 minutes of moderate intensity exercise 4-5 times per week. This could include walking, jogging, or swimming.

## 2022-04-08 ENCOUNTER — Telehealth: Payer: Self-pay | Admitting: *Deleted

## 2022-04-08 ENCOUNTER — Encounter (HOSPITAL_BASED_OUTPATIENT_CLINIC_OR_DEPARTMENT_OTHER): Payer: Self-pay

## 2022-04-08 NOTE — Telephone Encounter (Signed)
Prior Authorization for [employer] sent to [employer] via web portal. Per [employer] no PA is required. Kaila notified ok to activate device.Marland Kitchen

## 2022-04-30 DIAGNOSIS — C20 Malignant neoplasm of rectum: Secondary | ICD-10-CM | POA: Diagnosis not present

## 2022-05-01 DIAGNOSIS — N184 Chronic kidney disease, stage 4 (severe): Secondary | ICD-10-CM | POA: Diagnosis not present

## 2022-05-01 DIAGNOSIS — D631 Anemia in chronic kidney disease: Secondary | ICD-10-CM | POA: Diagnosis not present

## 2022-05-01 DIAGNOSIS — E1122 Type 2 diabetes mellitus with diabetic chronic kidney disease: Secondary | ICD-10-CM | POA: Diagnosis not present

## 2022-05-01 DIAGNOSIS — I129 Hypertensive chronic kidney disease with stage 1 through stage 4 chronic kidney disease, or unspecified chronic kidney disease: Secondary | ICD-10-CM | POA: Diagnosis not present

## 2022-05-01 DIAGNOSIS — N2581 Secondary hyperparathyroidism of renal origin: Secondary | ICD-10-CM | POA: Diagnosis not present

## 2022-05-05 ENCOUNTER — Emergency Department (HOSPITAL_COMMUNITY): Payer: Medicaid Other

## 2022-05-05 ENCOUNTER — Other Ambulatory Visit: Payer: Self-pay

## 2022-05-05 ENCOUNTER — Inpatient Hospital Stay (HOSPITAL_COMMUNITY)
Admission: EM | Admit: 2022-05-05 | Discharge: 2022-05-14 | DRG: 064 | Disposition: A | Discharge status: Skilled Nursing Facility | Payer: Medicaid Other | Attending: Internal Medicine | Admitting: Internal Medicine

## 2022-05-05 DIAGNOSIS — E872 Acidosis, unspecified: Secondary | ICD-10-CM | POA: Diagnosis not present

## 2022-05-05 DIAGNOSIS — I6381 Other cerebral infarction due to occlusion or stenosis of small artery: Secondary | ICD-10-CM | POA: Diagnosis not present

## 2022-05-05 DIAGNOSIS — R0603 Acute respiratory distress: Secondary | ICD-10-CM | POA: Diagnosis not present

## 2022-05-05 DIAGNOSIS — R079 Chest pain, unspecified: Secondary | ICD-10-CM

## 2022-05-05 DIAGNOSIS — Z87891 Personal history of nicotine dependence: Secondary | ICD-10-CM

## 2022-05-05 DIAGNOSIS — M109 Gout, unspecified: Secondary | ICD-10-CM | POA: Diagnosis present

## 2022-05-05 DIAGNOSIS — R29898 Other symptoms and signs involving the musculoskeletal system: Secondary | ICD-10-CM | POA: Diagnosis not present

## 2022-05-05 DIAGNOSIS — G4733 Obstructive sleep apnea (adult) (pediatric): Secondary | ICD-10-CM | POA: Diagnosis not present

## 2022-05-05 DIAGNOSIS — R0902 Hypoxemia: Secondary | ICD-10-CM | POA: Diagnosis present

## 2022-05-05 DIAGNOSIS — G936 Cerebral edema: Secondary | ICD-10-CM | POA: Diagnosis not present

## 2022-05-05 DIAGNOSIS — Z8616 Personal history of COVID-19: Secondary | ICD-10-CM

## 2022-05-05 DIAGNOSIS — F05 Delirium due to known physiological condition: Secondary | ICD-10-CM | POA: Diagnosis not present

## 2022-05-05 DIAGNOSIS — J4489 Other specified chronic obstructive pulmonary disease: Secondary | ICD-10-CM | POA: Diagnosis not present

## 2022-05-05 DIAGNOSIS — G8194 Hemiplegia, unspecified affecting left nondominant side: Secondary | ICD-10-CM | POA: Diagnosis present

## 2022-05-05 DIAGNOSIS — I10 Essential (primary) hypertension: Secondary | ICD-10-CM | POA: Diagnosis not present

## 2022-05-05 DIAGNOSIS — R471 Dysarthria and anarthria: Secondary | ICD-10-CM | POA: Diagnosis not present

## 2022-05-05 DIAGNOSIS — I639 Cerebral infarction, unspecified: Secondary | ICD-10-CM | POA: Diagnosis not present

## 2022-05-05 DIAGNOSIS — E1122 Type 2 diabetes mellitus with diabetic chronic kidney disease: Secondary | ICD-10-CM | POA: Diagnosis not present

## 2022-05-05 DIAGNOSIS — I619 Nontraumatic intracerebral hemorrhage, unspecified: Secondary | ICD-10-CM | POA: Diagnosis not present

## 2022-05-05 DIAGNOSIS — R29810 Facial weakness: Secondary | ICD-10-CM | POA: Diagnosis present

## 2022-05-05 DIAGNOSIS — N184 Chronic kidney disease, stage 4 (severe): Secondary | ICD-10-CM | POA: Diagnosis present

## 2022-05-05 DIAGNOSIS — Z8249 Family history of ischemic heart disease and other diseases of the circulatory system: Secondary | ICD-10-CM

## 2022-05-05 DIAGNOSIS — K649 Unspecified hemorrhoids: Secondary | ICD-10-CM | POA: Diagnosis present

## 2022-05-05 DIAGNOSIS — I6389 Other cerebral infarction: Secondary | ICD-10-CM | POA: Diagnosis not present

## 2022-05-05 DIAGNOSIS — I130 Hypertensive heart and chronic kidney disease with heart failure and stage 1 through stage 4 chronic kidney disease, or unspecified chronic kidney disease: Secondary | ICD-10-CM | POA: Diagnosis present

## 2022-05-05 DIAGNOSIS — D631 Anemia in chronic kidney disease: Secondary | ICD-10-CM | POA: Diagnosis not present

## 2022-05-05 DIAGNOSIS — I610 Nontraumatic intracerebral hemorrhage in hemisphere, subcortical: Secondary | ICD-10-CM | POA: Diagnosis not present

## 2022-05-05 DIAGNOSIS — J45909 Unspecified asthma, uncomplicated: Secondary | ICD-10-CM

## 2022-05-05 DIAGNOSIS — Z79899 Other long term (current) drug therapy: Secondary | ICD-10-CM

## 2022-05-05 DIAGNOSIS — E785 Hyperlipidemia, unspecified: Secondary | ICD-10-CM | POA: Diagnosis not present

## 2022-05-05 DIAGNOSIS — Z85048 Personal history of other malignant neoplasm of rectum, rectosigmoid junction, and anus: Secondary | ICD-10-CM

## 2022-05-05 DIAGNOSIS — R531 Weakness: Secondary | ICD-10-CM | POA: Diagnosis not present

## 2022-05-05 DIAGNOSIS — E875 Hyperkalemia: Secondary | ICD-10-CM | POA: Diagnosis present

## 2022-05-05 DIAGNOSIS — R29707 NIHSS score 7: Secondary | ICD-10-CM | POA: Diagnosis present

## 2022-05-05 DIAGNOSIS — R451 Restlessness and agitation: Secondary | ICD-10-CM | POA: Diagnosis not present

## 2022-05-05 DIAGNOSIS — I5032 Chronic diastolic (congestive) heart failure: Secondary | ICD-10-CM | POA: Diagnosis present

## 2022-05-05 DIAGNOSIS — R404 Transient alteration of awareness: Secondary | ICD-10-CM | POA: Diagnosis not present

## 2022-05-05 DIAGNOSIS — R0789 Other chest pain: Secondary | ICD-10-CM | POA: Diagnosis not present

## 2022-05-05 DIAGNOSIS — I4510 Unspecified right bundle-branch block: Secondary | ICD-10-CM | POA: Diagnosis not present

## 2022-05-05 DIAGNOSIS — R0602 Shortness of breath: Secondary | ICD-10-CM | POA: Diagnosis not present

## 2022-05-05 DIAGNOSIS — I161 Hypertensive emergency: Secondary | ICD-10-CM | POA: Diagnosis not present

## 2022-05-05 LAB — CBC
HCT: 34.9 % — ABNORMAL LOW (ref 39.0–52.0)
Hemoglobin: 10.5 g/dL — ABNORMAL LOW (ref 13.0–17.0)
MCH: 26.9 pg (ref 26.0–34.0)
MCHC: 30.1 g/dL (ref 30.0–36.0)
MCV: 89.5 fL (ref 80.0–100.0)
Platelets: 256 10*3/uL (ref 150–400)
RBC: 3.9 MIL/uL — ABNORMAL LOW (ref 4.22–5.81)
RDW: 18.7 % — ABNORMAL HIGH (ref 11.5–15.5)
WBC: 11.5 10*3/uL — ABNORMAL HIGH (ref 4.0–10.5)
nRBC: 0 % (ref 0.0–0.2)

## 2022-05-05 LAB — COMPREHENSIVE METABOLIC PANEL
ALT: 43 U/L (ref 0–44)
AST: 31 U/L (ref 15–41)
Albumin: 3.3 g/dL — ABNORMAL LOW (ref 3.5–5.0)
Alkaline Phosphatase: 173 U/L — ABNORMAL HIGH (ref 38–126)
Anion gap: 11 (ref 5–15)
BUN: 59 mg/dL — ABNORMAL HIGH (ref 6–20)
CO2: 19 mmol/L — ABNORMAL LOW (ref 22–32)
Calcium: 7.6 mg/dL — ABNORMAL LOW (ref 8.9–10.3)
Chloride: 110 mmol/L (ref 98–111)
Creatinine, Ser: 3.14 mg/dL — ABNORMAL HIGH (ref 0.61–1.24)
GFR, Estimated: 22 mL/min — ABNORMAL LOW (ref >60)
Glucose, Bld: 101 mg/dL — ABNORMAL HIGH (ref 70–99)
Potassium: 5.4 mmol/L — ABNORMAL HIGH (ref 3.5–5.1)
Sodium: 140 mmol/L (ref 135–145)
Total Bilirubin: 0.5 mg/dL (ref 0.3–1.2)
Total Protein: 7 g/dL (ref 6.5–8.1)

## 2022-05-05 LAB — MRSA NEXT GEN BY PCR, NASAL: MRSA by PCR Next Gen: NOT DETECTED

## 2022-05-05 LAB — D-DIMER, QUANTITATIVE: D-Dimer, Quant: 1.23 ug/mL-FEU — ABNORMAL HIGH (ref 0.00–0.50)

## 2022-05-05 LAB — HEMOGLOBIN A1C
Hgb A1c MFr Bld: 5.7 % — ABNORMAL HIGH (ref 4.8–5.6)
Mean Plasma Glucose: 116.89 mg/dL

## 2022-05-05 LAB — LIPID PANEL
Cholesterol: 95 mg/dL (ref 0–200)
HDL: 35 mg/dL — ABNORMAL LOW (ref >40)
LDL Cholesterol: 34 mg/dL (ref 0–99)
Total CHOL/HDL Ratio: 2.7 RATIO
Triglycerides: 131 mg/dL (ref <150)
VLDL: 26 mg/dL (ref 0–40)

## 2022-05-05 LAB — VITAMIN B12: Vitamin B-12: 721 pg/mL (ref 180–914)

## 2022-05-05 LAB — CBG MONITORING, ED: Glucose-Capillary: 100 mg/dL — ABNORMAL HIGH (ref 70–99)

## 2022-05-05 LAB — APTT: aPTT: 35 seconds (ref 24–36)

## 2022-05-05 LAB — PROTIME-INR
INR: 1.6 — ABNORMAL HIGH (ref 0.8–1.2)
Prothrombin Time: 19.2 seconds — ABNORMAL HIGH (ref 11.4–15.2)

## 2022-05-05 LAB — BRAIN NATRIURETIC PEPTIDE: B Natriuretic Peptide: 858.9 pg/mL — ABNORMAL HIGH (ref 0.0–100.0)

## 2022-05-05 LAB — TROPONIN I (HIGH SENSITIVITY)
Troponin I (High Sensitivity): 100 ng/L (ref <18)
Troponin I (High Sensitivity): 107 ng/L (ref <18)

## 2022-05-05 LAB — GLUCOSE, CAPILLARY: Glucose-Capillary: 193 mg/dL — ABNORMAL HIGH (ref 70–99)

## 2022-05-05 LAB — FOLATE: Folate: 16.8 ng/mL (ref >5.9)

## 2022-05-05 MED ORDER — FUROSEMIDE 40 MG PO TABS
60.0000 mg | ORAL_TABLET | Freq: Two times a day (BID) | ORAL | Status: DC
  Administered 2022-05-06: 60 mg via ORAL
  Filled 2022-05-05: qty 1

## 2022-05-05 MED ORDER — ONDANSETRON HCL 4 MG/2ML IJ SOLN: 4.0000 mg | Freq: Four times a day (QID) | INTRAMUSCULAR | Status: DC | PRN

## 2022-05-05 MED ORDER — SODIUM ZIRCONIUM CYCLOSILICATE 5 G PO PACK: 5.0000 g | PACK | Freq: Once | ORAL | Status: DC

## 2022-05-05 MED ORDER — ACETAMINOPHEN 650 MG RE SUPP: 650.0000 mg | RECTAL | Status: DC | PRN

## 2022-05-05 MED ORDER — ALBUTEROL SULFATE (2.5 MG/3ML) 0.083% IN NEBU
2.5000 mg | INHALATION_SOLUTION | RESPIRATORY_TRACT | Status: DC | PRN
  Administered 2022-05-06 – 2022-05-07 (×2): 2.5 mg via RESPIRATORY_TRACT
  Filled 2022-05-05 (×2): qty 3

## 2022-05-05 MED ORDER — SENNOSIDES-DOCUSATE SODIUM 8.6-50 MG PO TABS
1.0000 | ORAL_TABLET | Freq: Two times a day (BID) | ORAL | Status: DC
  Administered 2022-05-05 – 2022-05-12 (×8): 1 via ORAL
  Filled 2022-05-05 (×13): qty 1

## 2022-05-05 MED ORDER — AMLODIPINE BESYLATE 10 MG PO TABS
10.0000 mg | ORAL_TABLET | Freq: Every day | ORAL | Status: DC
  Administered 2022-05-06 – 2022-05-14 (×9): 10 mg via ORAL
  Filled 2022-05-05 (×9): qty 1

## 2022-05-05 MED ORDER — CLEVIDIPINE BUTYRATE 0.5 MG/ML IV EMUL
0.0000 mg/h | INTRAVENOUS | Status: DC
  Administered 2022-05-05: 2 mg/h via INTRAVENOUS
  Filled 2022-05-05: qty 100

## 2022-05-05 MED ORDER — STROKE: EARLY STAGES OF RECOVERY BOOK
Freq: Once | Status: AC
  Filled 2022-05-05: qty 1

## 2022-05-05 MED ORDER — MAGNESIUM SULFATE 2 GM/50ML IV SOLN
2.0000 g | Freq: Once | INTRAVENOUS | Status: AC
  Administered 2022-05-05: 2 g via INTRAVENOUS
  Filled 2022-05-05: qty 50

## 2022-05-05 MED ORDER — FUROSEMIDE 10 MG/ML IJ SOLN
80.0000 mg | Freq: Once | INTRAMUSCULAR | Status: AC
  Administered 2022-05-05: 80 mg via INTRAVENOUS
  Filled 2022-05-05: qty 8

## 2022-05-05 MED ORDER — INSULIN ASPART 100 UNIT/ML IJ SOLN
0.0000 [IU] | INTRAMUSCULAR | Status: DC | PRN
  Administered 2022-05-06: 3 [IU] via SUBCUTANEOUS
  Administered 2022-05-06: 2 [IU] via SUBCUTANEOUS

## 2022-05-05 MED ORDER — ORAL CARE MOUTH RINSE: 15.0000 mL | OROMUCOSAL | Status: DC | PRN

## 2022-05-05 MED ORDER — ATORVASTATIN CALCIUM 80 MG PO TABS
80.0000 mg | ORAL_TABLET | Freq: Every day | ORAL | Status: DC
  Administered 2022-05-06 – 2022-05-14 (×9): 80 mg via ORAL
  Filled 2022-05-05 (×9): qty 1

## 2022-05-05 MED ORDER — ACETAMINOPHEN 160 MG/5ML PO SOLN: 650.0000 mg | ORAL | Status: DC | PRN

## 2022-05-05 MED ORDER — CLEVIDIPINE BUTYRATE 0.5 MG/ML IV EMUL
0.0000 mg/h | INTRAVENOUS | Status: DC
  Administered 2022-05-05: 6 mg/h via INTRAVENOUS
  Administered 2022-05-06: 20 mg/h via INTRAVENOUS
  Administered 2022-05-06: 16 mg/h via INTRAVENOUS
  Administered 2022-05-06: 14 mg/h via INTRAVENOUS
  Administered 2022-05-06: 4 mg/h via INTRAVENOUS
  Administered 2022-05-06: 8 mg/h via INTRAVENOUS
  Administered 2022-05-06: 15 mg/h via INTRAVENOUS
  Filled 2022-05-05: qty 100
  Filled 2022-05-05: qty 50
  Filled 2022-05-05 (×3): qty 100
  Filled 2022-05-05: qty 200

## 2022-05-05 MED ORDER — POLYETHYLENE GLYCOL 3350 17 G PO PACK: 17.0000 g | PACK | Freq: Every day | ORAL | Status: DC | PRN

## 2022-05-05 MED ORDER — DOCUSATE SODIUM 100 MG PO CAPS: 100.0000 mg | ORAL_CAPSULE | Freq: Two times a day (BID) | ORAL | Status: DC | PRN

## 2022-05-05 MED ORDER — CARVEDILOL 12.5 MG PO TABS
25.0000 mg | ORAL_TABLET | Freq: Two times a day (BID) | ORAL | Status: DC
  Administered 2022-05-06 – 2022-05-14 (×17): 25 mg via ORAL
  Filled 2022-05-05 (×17): qty 2

## 2022-05-05 MED ORDER — CHLORHEXIDINE GLUCONATE CLOTH 2 % EX PADS
6.0000 | MEDICATED_PAD | Freq: Every day | CUTANEOUS | Status: DC
  Administered 2022-05-06 – 2022-05-10 (×4): 6 via TOPICAL

## 2022-05-05 MED ORDER — PANTOPRAZOLE SODIUM 40 MG IV SOLR
40.0000 mg | Freq: Every day | INTRAVENOUS | Status: DC
  Administered 2022-05-05 – 2022-05-06 (×2): 40 mg via INTRAVENOUS
  Filled 2022-05-05 (×2): qty 10

## 2022-05-05 MED ORDER — ACETAMINOPHEN 325 MG PO TABS
650.0000 mg | ORAL_TABLET | ORAL | Status: DC | PRN
  Administered 2022-05-06 – 2022-05-13 (×12): 650 mg via ORAL
  Filled 2022-05-05 (×12): qty 2

## 2022-05-05 NOTE — Consult Note (Signed)
Neurology Consultation Reason for Consult: Left basal ganglia ICH  Requesting Physician: Margaretmary Eddy  CC: Left sided weakness   History is obtained from: Patient and chart review   HPI: Craig West is a 57 y.o. male with a past medical history significant for hypertension, diabetes (reports diet and exercise controlled), hyperlipidemia, asthma, remote smoking (currently smoking marijuana once or twice daily), gout  Friday morning he noted he had some left-sided numbness and slight weakness, which he did not think too much of his it was not severe.  However symptoms worsened on Friday evening to the point that on Saturday he was unable to reach his medications and therefore did not take any of his medications on Saturday.  He was brought into the ED today by EMS and was given 324 mg of aspirin in route for chest pain and shortness of breath.  He notes that the shortness of breath is continuous due to his baseline asthma but worse since Sunday  LKW: Thursday night Thrombolytic given?: No, ICH Premorbid modified rankin scale:      0 - No symptoms. ICH Score:   Time performed: 8:40 PM GCS: 13-15 is 0 points Infratentorial: No.. If yes, 1 point -- 0 Volume: <30cc is 0 points  Age: 57 y.o.. >80 is 1 point -- 0 Intraventricular extension is 1 point -- 0   Score:0 A Score of 0 points has a 30 day mortality of 0%. Stroke. 2001 Apr;32(4):891-7.  ROS: Unable to obtain due to altered mental status.   Past Medical History:  Diagnosis Date   Asthma    CHF (congestive heart failure) ([place])    CKD (chronic kidney disease), stage IV ([place])    COVID-19 virus infection 04/2019   Diabetes mellitus without complication (HCC)    Fatigue 11/23/2020   Gout    Hypertension    Leg heaviness 11/23/2020   NSVT (nonsustained ventricular tachycardia) ([place]) 11/23/2020   Rectal cancer [facility])    Past Surgical History:  Procedure Laterality Date   BIOPSY  01/11/2020   Procedure: BIOPSY;  Surgeon: Irene Shipper, MD;  Location: [place];  Service: Endoscopy;;   COLONOSCOPY WITH PROPOFOL N/A 01/11/2020   Procedure: COLONOSCOPY WITH PROPOFOL;  Surgeon: Irene Shipper, MD;  Location: [place];  Service: Endoscopy;  Laterality: N/A;   NO PAST SURGERIES     POLYPECTOMY  01/11/2020   Procedure: POLYPECTOMY;  Surgeon: Irene Shipper, MD;  Location: [place];  Service: Endoscopy;;   SUBMUCOSAL TATTOO INJECTION  01/11/2020   Procedure: SUBMUCOSAL TATTOO INJECTION;  Surgeon: Irene Shipper, MD;  Location: [facility] ENDOSCOPY;  Service: Endoscopy;;   Current Outpatient Medications  Medication Instructions   albuterol (PROVENTIL) 2.5 mg, Nebulization, Every 6 hours PRN   albuterol (VENTOLIN HFA) 108 (90 Base) MCG/ACT inhaler 1-2 puffs, Inhalation, Every 6 hours PRN   allopurinol (ZYLOPRIM) 100 mg, Oral, Daily   amLODipine (NORVASC) 10 mg, Oral, Daily   atorvastatin (LIPITOR) 80 mg, Oral, Daily   Blood Glucose Monitoring Suppl (TRUE METRIX METER) w/Device KIT Check blood sugar fasting and ant bedtime and record   carvedilol (COREG) 25 mg, Oral, 2 times daily with meals   colchicine 0.6 MG tablet Take 1 tablet by mouth as needed. Take 1 tablet (0.6 mg) by mouth at the onset of a gout attack with next dose no sooner than 3 days later   furosemide (LASIX) 60 mg, Oral, 2 times daily   glucose blood (TRUE METRIX BLOOD GLUCOSE TEST) test strip Use  as instructed   hydrALAZINE (APRESOLINE) 50 mg, Oral, 2 times daily   Misc. Devices (DIGITAL GLASS SCALE) MISC Use scale to weight yourself. Increasing weight may indicate fluid overload   TRUEplus Lancets 28G MISC Check blood sugar fasting and at bedtime     Family History  Problem Relation Age of Onset   Heart failure Brother     Social History:  reports that he quit smoking about 2 years ago. His smoking use included cigarettes. He smoked an average of .5 packs per day. He has never used smokeless tobacco. He reports current alcohol use. He reports current  drug use. Drug: Marijuana.   Exam: Current vital signs: BP (!) 211/128   Pulse 77   Resp (!) 21   Ht '6\' 1"'$  (1.854 m)   Wt 102.1 kg   SpO2 96%   BMI 29.69 kg/m  Vital signs in last 24 hours: Pulse Rate:  [77] 77 (02/04 1845) Resp:  [21] 21 (02/04 1845) BP: (211)/(128) 211/128 (02/04 1845) SpO2:  [96 %-100 %] 96 % (02/04 1845) Weight:  [102.1 kg] 102.1 kg (02/04 1839)   Physical Exam  Constitutional: Appears well-developed and well-nourished.  Psych: Affect appropriate to situation, pleasant and cooperative Eyes: No scleral injection HENT: No oropharyngeal obstruction.  MSK: no joint deformities.  Cardiovascular: Normal rate and regular rhythm. Perfusing extremities well Respiratory: Audibly wheezing and very quickly short of breath on strength testing GI: Soft.  No distension. There is no tenderness.  Skin: Warm dry and intact visible skin  Neuro: Mental Status: Patient is awake, alert, oriented to person, place, month, year, and situation. Patient is able to give a clear and coherent history. No signs of aphasia or neglect Cranial Nerves: II: Visual Fields are full. Pupils are equal, round, and reactive to light.   III,IV, VI: EOMI without ptosis or diploplia.  V: Facial sensation is symmetric to temperature VII: Facial movement is symmetric.  VIII: hearing is intact to voice X: Uvula elevates symmetrically XI: Shoulder shrug is symmetric. XII: tongue is midline without atrophy or fasciculations.  Motor: Tone is normal. Bulk is normal. 5/5 strength was present in on the right side.  On the left 4/5 in the upper extremity, 3/5 lower extremity Sensory: Insensate on the left side Deep Tendon Reflexes: Hypoactive throughout Plantars: Toes are mute on the left, downgoing on the right Cerebellar: FNF and HKS are intact within limits of weakness Gait:  Deferred for safety  NIHSS total 6 Score breakdown: 1.4 left facial droop, 1 point for left arm weakness, 1 point  for left leg weakness, 2 points for severe sensory loss on the left side, 1 point for dysarthria Performed at 8:40 PM time of patient arrival to ED    I have reviewed labs in epic and the results pertinent to this consultation are:   Basic Metabolic Panel: Recent Labs  Lab 05/05/22 1833  NA 140  K 5.4*  CL 110  CO2 19*  GLUCOSE 101*  BUN 59*  CREATININE 3.14*  CALCIUM 7.6*  Creatinine is at his baseline  CBC: Recent Labs  Lab 05/05/22 1833  WBC 11.5*  HGB 10.5*  HCT 34.9*  MCV 89.5  PLT 256    Coagulation Studies: No results for input(s): "LABPROT", "INR" in the last 72 hours.   Elevated troponin 100 on admission rising to 107  proBNP elevated at 858.9  I have reviewed the images obtained:  Head CT personally reviewed with right sublingual hemorrhage with some slight cytotoxic  edema surrounding "Acute hypertensive type hemorrhage in the right thalamus measuring 2.6 mL."  Chest x-ray personally reviewed with concern for pulmonary edema "Cardiomegaly with vascular congestion and perihilar/lower lobe opacities. This could reflect edema. Suspect small layering effusions"  Impression: Suspect primary ischemic stroke, stuttering lacunar type, followed by hemorrhagic conversion in the setting of not being able to get his medications due to weakness with hypertensive urgency/emergency, which is also contributing to respiratory distress secondary to likely pulmonary edema  Recommendations: # Hemorrhagic stroke, likely ischemic with secondary hemorrhagic conversion versus primary hypertensive, less likely underlying mass - Appreciate CCM management of cardiopulmonary status, neurology will follow the patient - At this time given his clinical stability will hold off on DDAVP for reversing aspirin and Plavix (especially as he may need diuresis to manage cardiopulmonary status), but if imaging is worsening will consider this intervention - Repeat head CT at 1 AM on 2/3 for  stability - Stroke labs HgbA1c, fasting lipid panel - MRI brain w/o when stabilized, MRA head without; due to GFR cannot use contrast - Frequent neuro checks, q1hr  - Echocardiogram - Carotid dopplers - No antiplatelets due to [place] - DVT PPx heparin at 24 hrs if stable, SCDs for now - Risk factor modification - Telemetry monitoring; 30 day event monitor on discharge if no arrythmias captured  - Blood pressure goal SBP 130 - 150  - PT consult, OT consult, Speech consult when patient stabilized  - Stroke team to follow  #Daily drinking, reports 1-2 beers per day - B12 and folate levels - Consider CIWAS (defer to CCM)  #SOB, Asthma, c/f pulmonary edema #Elevated troponin #c/f heart failure - Appreciate CCM  Lesleigh Noe MD-PhD [facility] [phone] Available 7 PM to 7 AM, outside of these hours please call Neurologist on call as listed on Amion.  Discussed with ED and CCM in person  Total critical care time: 45 minutes   Critical care time was exclusive of separately billable procedures and treating other patients.   Critical care was necessary to treat or prevent imminent or life-threatening deterioration.   Critical care was time spent personally by me on the following activities: development of treatment plan with patient and/or surrogate as well as nursing, discussions with consultants/primary team, evaluation of patient's response to treatment, examination of patient, obtaining history from patient or surrogate, ordering and performing treatments and interventions, ordering and review of laboratory studies, ordering and review of radiographic studies, and re-evaluation of patient's condition as needed, as documented above.

## 2022-05-05 NOTE — ED Provider Notes (Signed)
[facility] Provider Note   CSN: [id number] Arrival date & time: 05/05/22  1813     History  Chief Complaint  Patient presents with   Hypertension   Extremity Weakness   Facial Droop   Shortness of Breath    Craig West is a 57 y.o. male.  57 year old male with a history of rectal cancer, CHF, COPD not on oxygen, CKD, and diabetes who presents to the emergency department with left-sided weakness.  Patient went to bed and then woke up at 8 AM on Friday morning with left-sided numbness.  Also reports that he was having difficulty walking due to left leg weakness.  Says that yesterday started having chest pains.  Describes it as left-sided.  Does not radiate.  Not exertional or pleuritic.  Also having some shortness of breath.  No diaphoresis.  No history of MI.  No history of DVT or PE.  Is not on blood thinners.  Placed on nasal cannula prior to my evaluation.       Home Medications Prior to Admission medications   Medication Sig Start Date End Date Taking? Authorizing Provider  albuterol (PROVENTIL) (2.5 MG/3ML) 0.083% nebulizer solution Take 3 mLs (2.5 mg total) by nebulization every 6 (six) hours as needed for wheezing or shortness of breath. 03/06/22   Nyoka Lint, PA-C  albuterol (VENTOLIN HFA) 108 (90 Base) MCG/ACT inhaler Inhale 1-2 puffs into the lungs every 6 (six) hours as needed for wheezing or shortness of breath. 03/06/22   Nyoka Lint, PA-C  allopurinol (ZYLOPRIM) 100 MG tablet Take 1 tablet (100 mg total) by mouth daily. 12/17/21   Charlott Rakes, MD  amLODipine (NORVASC) 10 MG tablet Take 1 tablet (10 mg total) by mouth daily. 12/17/21   Charlott Rakes, MD  atorvastatin (LIPITOR) 80 MG tablet Take 1 tablet (80 mg total) by mouth daily. 12/17/21   Charlott Rakes, MD  Blood Glucose Monitoring Suppl (TRUE METRIX METER) w/Device KIT Check blood sugar fasting and ant bedtime and record 05/15/17   Charlott Rakes, MD  carvedilol  (COREG) 25 MG tablet Take 1 tablet (25 mg total) by mouth 2 (two) times daily with a meal. 12/17/21   Charlott Rakes, MD  colchicine 0.6 MG tablet Take 1 tablet by mouth as needed. Take 1 tablet (0.6 mg) by mouth at the onset of a gout attack with next dose no sooner than 3 days later 12/21/21   Charlott Rakes, MD  furosemide (LASIX) 20 MG tablet Take 3 tablets (60 mg total) by mouth 2 (two) times daily. 03/06/22   Nyoka Lint, PA-C  glucose blood (TRUE METRIX BLOOD GLUCOSE TEST) test strip Use as instructed 12/27/19   Andrew Au, MD  hydrALAZINE (APRESOLINE) 50 MG tablet Take 1 tablet (50 mg total) by mouth 2 (two) times daily. 12/17/21   Charlott Rakes, MD  Misc. Devices (DIGITAL GLASS SCALE) MISC Use scale to weight yourself. Increasing weight may indicate fluid overload 12/27/19   Andrew Au, MD  TRUEplus Lancets 28G MISC Check blood sugar fasting and at bedtime 12/27/19   Andrew Au, MD      Allergies    Patient has no known allergies.    Review of Systems   Review of Systems  Physical Exam Updated Vital Signs BP (!) 164/89   Pulse 78   Temp 98.8 F (37.1 C) (Oral)   Resp (!) 28   Ht '6\' 1"'$  (1.854 m)   Wt 102.1 kg  SpO2 93%   BMI 29.69 kg/m  Physical Exam Vitals and nursing note reviewed.  Constitutional:      General: He is not in acute distress.    Appearance: He is well-developed.  HENT:     Head: Normocephalic and atraumatic.     Right Ear: External ear normal.     Left Ear: External ear normal.     Nose: Nose normal.  Eyes:     Extraocular Movements: Extraocular movements intact.     Conjunctiva/sclera: Conjunctivae normal.     Pupils: Pupils are equal, round, and reactive to light.  Cardiovascular:     Rate and Rhythm: Normal rate and regular rhythm.     Heart sounds: Normal heart sounds.  Pulmonary:     Effort: Pulmonary effort is normal. No respiratory distress.     Breath sounds: Rales (Bibasilar) present.     Comments: On 2 L nasal  cannula Abdominal:     General: There is no distension.     Palpations: Abdomen is soft. There is no mass.     Tenderness: There is no abdominal tenderness. There is no guarding.  Musculoskeletal:     Cervical back: Normal range of motion and neck supple.     Right lower leg: No edema.     Left lower leg: No edema.  Skin:    General: Skin is warm and dry.  Neurological:     Mental Status: He is alert.     Comments: MENTAL STATUS: AAOx3 CRANIAL NERVES: II: Pupils equal and reactive 3 mm BL, no RAPD, no VF deficits III, IV, VI: EOM intact, no gaze preference or deviation, no nystagmus. V: Diminished sensation in left face VII: no facial weakness or asymmetry, no nasolabial fold flattening VIII: normal hearing to speech and finger friction IX, X: normal palatal elevation, no uvular deviation XI: 5/5 head turn and 5/5 shoulder shrug bilaterally XII: midline tongue protrusion MOTOR: 5/5 strength in R shoulder flexion, elbow flexion and extension, and grip strength. 3/5 strength in L shoulder flexion, elbow flexion and extension, and grip strength.  5/5 strength in R hip and knee flexion, knee extension, ankle plantar and dorsiflexion. 3/5 strength in L hip and knee flexion, knee extension, ankle plantar and dorsiflexion. SENSORY: Diminished sensation to light touch in left hemibody  Psychiatric:        Mood and Affect: Mood normal.        Behavior: Behavior normal.     ED Results / Procedures / Treatments   Labs (all labs ordered are listed, but only abnormal results are displayed) Labs Reviewed  CBC - Abnormal; Notable for the following components:      Result Value   WBC 11.5 (*)    RBC 3.90 (*)    Hemoglobin 10.5 (*)    HCT 34.9 (*)    RDW 18.7 (*)    All other components within normal limits  COMPREHENSIVE METABOLIC PANEL - Abnormal; Notable for the following components:   Potassium 5.4 (*)    CO2 19 (*)    Glucose, Bld 101 (*)    BUN 59 (*)    Creatinine, Ser 3.14  (*)    Calcium 7.6 (*)    Albumin 3.3 (*)    Alkaline Phosphatase 173 (*)    GFR, Estimated 22 (*)    All other components within normal limits  BRAIN NATRIURETIC PEPTIDE - Abnormal; Notable for the following components:   B Natriuretic Peptide 858.9 (*)    All other  components within normal limits  D-DIMER, QUANTITATIVE - Abnormal; Notable for the following components:   D-Dimer, Quant 1.23 (*)    All other components within normal limits  CBG MONITORING, ED - Abnormal; Notable for the following components:   Glucose-Capillary 100 (*)    All other components within normal limits  TROPONIN I (HIGH SENSITIVITY) - Abnormal; Notable for the following components:   Troponin I (High Sensitivity) 100 (*)    All other components within normal limits  TROPONIN I (HIGH SENSITIVITY) - Abnormal; Notable for the following components:   Troponin I (High Sensitivity) 107 (*)    All other components within normal limits  HEMOGLOBIN A1C  LIPID PANEL  VITAMIN B12  FOLATE  HIV ANTIBODY (ROUTINE TESTING W REFLEX)  CBG MONITORING, ED    EKG EKG Interpretation  Date/Time:  Sunday May 05 2022 18:43:40 EST Ventricular Rate:  76 PR Interval:  158 QRS Duration: 127 QT Interval:  433 QTC Calculation: 487 R Axis:   58 Text Interpretation: Sinus rhythm Right bundle branch block ST depressions more pronounced in anterior leads Confirmed by Margaretmary Eddy [phone]) on 05/05/2022 7:01:57 PM  Radiology CT Head Wo Contrast  Result Date: 05/05/2022 CLINICAL DATA:  Left-sided weakness and facial droop EXAM: CT HEAD WITHOUT CONTRAST TECHNIQUE: Contiguous axial images were obtained from the base of the skull through the vertex without intravenous contrast. RADIATION DOSE REDUCTION: This exam was performed according to the departmental dose-optimization program which includes automated exposure control, adjustment of the mA and/or kV according to patient size and/or use of iterative reconstruction technique.  COMPARISON:  None Available. FINDINGS: Brain: Acute intraparenchymal hemorrhage in the right thalamus measuring 2.1 x 1.8 x 1.4 cm. Mild edema. Parenchyma is otherwise normal. No hydrocephalus or mass effect. Vascular: No hyperdense vessel or unexpected calcification. Skull: Normal. Negative for fracture or focal lesion. Sinuses/Orbits: No acute finding. Other: None. IMPRESSION: Acute hypertensive type hemorrhage in the right thalamus measuring 2.6 mL. Critical Value/emergent results were called by telephone at the time of interpretation on 05/05/2022 at 7:52 pm to provider Margaretmary Eddy , who verbally acknowledged these results. Electronically Signed   By: Ulyses Jarred M.D.   On: 05/05/2022 19:52   DG Chest Portable 1 View  Result Date: 05/05/2022 CLINICAL DATA:  Stroke, shortness of breath, chest pain, hypertension EXAM: PORTABLE CHEST 1 VIEW COMPARISON:  03/06/2022 FINDINGS: Cardiomegaly, vascular congestion. Perihilar and lower lobe airspace opacities. Possible small effusions. No acute bony abnormality. IMPRESSION: Cardiomegaly with vascular congestion and perihilar/lower lobe opacities. This could reflect edema. Suspect small layering effusions. Electronically Signed   By: Rolm Baptise M.D.   On: 05/05/2022 19:13    Procedures Procedures   Medications Ordered in ED Medications   stroke: early stages of recovery book (has no administration in time range)  acetaminophen (TYLENOL) tablet 650 mg (has no administration in time range)    Or  acetaminophen (TYLENOL) 160 MG/5ML solution 650 mg (has no administration in time range)    Or  acetaminophen (TYLENOL) suppository 650 mg (has no administration in time range)  senna-docusate (Senokot-S) tablet 1 tablet (has no administration in time range)  pantoprazole (PROTONIX) injection 40 mg (has no administration in time range)  clevidipine (CLEVIPREX) infusion 0.5 mg/mL (has no administration in time range)  docusate sodium (COLACE) capsule 100 mg  (has no administration in time range)  polyethylene glycol (MIRALAX / GLYCOLAX) packet 17 g (has no administration in time range)  ondansetron (ZOFRAN) injection 4 mg (has no administration  in time range)  magnesium sulfate IVPB 2 g 50 mL (has no administration in time range)  albuterol (PROVENTIL) (2.5 MG/3ML) 0.083% nebulizer solution 2.5 mg (has no administration in time range)  amLODipine (NORVASC) tablet 10 mg (has no administration in time range)  carvedilol (COREG) tablet 25 mg (has no administration in time range)  atorvastatin (LIPITOR) tablet 80 mg (has no administration in time range)  furosemide (LASIX) injection 80 mg (80 mg Intravenous Given 05/05/22 2121)    ED Course/ Medical Decision Making/ A&P Clinical Course as of 05/05/22 2128  Sun May 05, 2022  2017 Dr Koleen Nimrod from cards was consulted and feels that this may be demand in the setting of his stroke. [RP]  2044 Dr Curly [place] at the bedside.  Does not recommend desmopressin at this time.   [RP]  2053 CCM to admit.  [RP]    Clinical Course User Index [RP] Fransico Meadow, MD                             Medical Decision Making Amount and/or Complexity of Data Reviewed Labs: ordered. Radiology: ordered.  Risk Prescription drug management. Decision regarding hospitalization.   Craig West is a 57 y.o. male with comorbidities that complicate the patient evaluation including rectal cancer, CHF, COPD not on oxygen, CKD, and diabetes who presents to the emergency department with left-sided weakness.  Initial Ddx:  ICH, ischemic stroke, MI, PE, CHF exacerbation  MDM:  Concerned about possible ischemic stroke of the window for acute intervention given the patient's symptoms.  Will obtain head CT to evaluate for ICH.  Given his chest pain could essentially have an MI.  Also considering pulmonary embolism given his hypoxia but does have a history of CHF so we will also workup for this as a possible cause of his hypoxia.   No wheezing on exam that would suggest COPD exacerbation at this time.  Plan:  Labs D-dimer Troponin BNP EKG Chest x-ray CT head  ED Summary/Re-evaluation:  Patient underwent the above workup and was found to have a hemorrhagic stroke on his head CT.  Was started on clevidipine for blood pressure control.  Discussed with neurology.  Will get interval head CT and consider if aspirin needs to be reversed.  Chest x-ray showed possible volume overload and BNP was found to be elevated so patient was given Lasix.  Feel this is likely the cause of his hypoxia.  Troponin was elevated but flat which is likely demand in the setting of a stroke.  Cardiology was consulted and does not feel any acute intervention or additional investigations are required.  D-dimer was found to be elevated but given the fact that patient has [place] at this time do not feel that he would benefit from anticoagulation.  Will defer possible VQ scan to admitting team.   This patient presents to the ED for concern of complaints listed in HPI, this involves an extensive number of treatment options, and is a complaint that carries with it a high risk of complications and morbidity. Disposition including potential need for admission considered.   Dispo: ICU  Additional history obtained from EMS Records reviewed Outpatient Clinic Notes The following labs were independently interpreted: Chemistry and Serial Troponins and show CKD I independently reviewed the following imaging with scope of interpretation limited to determining acute life threatening conditions related to emergency care: CT Head and agree with the radiologist interpretation with the following exceptions: none  I personally reviewed and interpreted cardiac monitoring: normal sinus rhythm  I personally reviewed and interpreted the pt's EKG: see above for interpretation  I have reviewed the patients home medications and made adjustments as needed Consults: Cardiology and  Neurology Social Determinants of health:  None  Final Clinical Impression(s) / ED Diagnoses Final diagnoses:  Nontraumatic intracerebral hemorrhage, unspecified cerebral location, unspecified laterality ([place])  Chest pain, unspecified type  Hypertensive emergency    Rx / DC Orders ED Discharge Orders     None      CRITICAL CARE Performed by: Fransico Meadow   Total critical care time: 30 minutes  Critical care time was exclusive of separately billable procedures and treating other patients.  Critical care was necessary to treat or prevent imminent or life-threatening deterioration.  Critical care was time spent personally by me on the following activities: development of treatment plan with patient and/or surrogate as well as nursing, discussions with consultants, evaluation of patient's response to treatment, examination of patient, obtaining history from patient or surrogate, ordering and performing treatments and interventions, ordering and review of laboratory studies, ordering and review of radiographic studies, pulse oximetry and re-evaluation of patient's condition.     Fransico Meadow, MD 05/05/22 2129

## 2022-05-05 NOTE — ED Notes (Signed)
ED TO INPATIENT HANDOFF REPORT  ED Nurse Name and Phone #:  [place] [phone]  S Name/Age/Gender Craig West 57 y.o. male Room/Bed: 001C/001C  Code Status   Code Status: Full Code  Home/SNF/Other Unknown, pt came from home Patient oriented to: self, place, time, and situation Is this baseline? Yes   Triage Complete: Triage complete  Chief Complaint Stroke [facility]) [I63.9]  Triage Note Pt arrived via [facility] for chest pain, left sided weakness upper and lower extremity with numbness, left sided facial drop starting Friday morning, and hypertension. Pt reports numbness and weakness began Friday morning. Pt reports PMH asthma, increased work of breathing noted by EMS. Neb treatment administered and pt placed on 2L [place] with improvement in shortness of breath. Pt reports chest pain started yesterday as tightness. 324 ASA, 0.4 SL Nitro without improvement in symptoms.   PTA EMS Vitals  BP 193/134     Allergies No Known Allergies  Level of Care/Admitting Diagnosis ED Disposition     ED Disposition  Admit   Condition  --   Comment  Hospital Area: [place] [[id number]]  Level of Care: ICU [6]  May admit patient to [facility] or [facility] if equivalent level of care is available:: Yes  Covid Evaluation: Asymptomatic - no recent exposure (last 10 days) testing not required  Diagnosis: Stroke [facility]) [[id number]]  Admitting Physician: Audria Nine [[id number]]  Attending Physician: Audria Nine [[id number]]  Certification:: I certify this patient will need inpatient services for at least 2 midnights  Estimated Length of Stay: 5          B Medical/Surgery History Past Medical History:  Diagnosis Date   Asthma    CHF (congestive heart failure) ([place])    CKD (chronic kidney disease), stage IV ([place])    COVID-19 virus infection 04/2019   Diabetes mellitus without complication ([place])    Fatigue 11/23/2020   Gout    Hypertension    Leg heaviness 11/23/2020    NSVT (nonsustained ventricular tachycardia) ([place]) 11/23/2020   Rectal cancer [facility])    Past Surgical History:  Procedure Laterality Date   BIOPSY  01/11/2020   Procedure: BIOPSY;  Surgeon: Irene Shipper, MD;  Location: [place];  Service: Endoscopy;;   COLONOSCOPY WITH PROPOFOL N/A 01/11/2020   Procedure: COLONOSCOPY WITH PROPOFOL;  Surgeon: Irene Shipper, MD;  Location: [facility] ENDOSCOPY;  Service: Endoscopy;  Laterality: N/A;   NO PAST SURGERIES     POLYPECTOMY  01/11/2020   Procedure: POLYPECTOMY;  Surgeon: Irene Shipper, MD;  Location: [facility] ENDOSCOPY;  Service: Endoscopy;;   SUBMUCOSAL TATTOO INJECTION  01/11/2020   Procedure: SUBMUCOSAL TATTOO INJECTION;  Surgeon: Irene Shipper, MD;  Location[facility] END[facility]COPY;  Service: Endoscopy;;     A IV Location/Drains/Wounds Patient Lines/Drains/Airways Status     Active Line/Drains/Airways     Name Placement date Placement time Site Days   Peripheral IV 05/05/22 20 G Right Antecubital 05/05/22  1856  Antecubital  less than 1            Intake/Output Last 24 hours  Intake/Output Summary (Last 24 hours) at 05/05/2022 2127 Last data filed at 05/05/2022 2028 Gross per 24 hour  Intake --  Output 200 ml  Net -200 ml    Labs/Imaging Results for orders placed or performed during the hospital encounter of 05/05/22 (from the past 48 hour(s))  CBC     Status: Abnormal   Collection Time: 05/05/22  6:33 PM  Result Value Ref Range  WBC 11.5 (H) 4.0 - 10.5 K/uL   RBC 3.90 (L) 4.22 - 5.81 MIL/uL   Hemoglobin 10.5 (L) 13.0 - 17.0 g/dL   HCT 34.9 (L) 39.0 - 52.0 %   MCV 89.5 80.0 - 100.0 fL   MCH 26.9 26.0 - 34.0 pg   MCHC 30.1 30.0 - 36.0 g/dL   RDW 18.7 (H) 11.5 - 15.5 %   Platelets 256 150 - 400 K/uL   nRBC 0.0 0.0 - 0.2 %    Comment: Performed at [facility] [address], [place], [place] [address]  Comprehensive metabolic panel     Status: Abnormal   Collection Time: 05/05/22  6:33 PM  Result Value Ref Range   Sodium 140 135 -  145 mmol/L   Potassium 5.4 (H) 3.5 - 5.1 mmol/L   Chloride 110 98 - 111 mmol/L   CO2 19 (L) 22 - 32 mmol/L   Glucose, Bld 101 (H) 70 - 99 mg/dL    Comment: Glucose reference range applies only to samples taken after fasting for at least 8 hours.   BUN 59 (H) 6 - 20 mg/dL   Creatinine, Ser 3.14 (H) 0.61 - 1.24 mg/dL   Calcium 7.6 (L) 8.9 - 10.3 mg/dL   Total Protein 7.0 6.5 - 8.1 g/dL   Albumin 3.3 (L) 3.5 - 5.0 g/dL   AST 31 15 - 41 U/L   ALT 43 0 - 44 U/L   Alkaline Phosphatase 173 (H) 38 - 126 U/L   Total Bilirubin 0.5 0.3 - 1.2 mg/dL   GFR, Estimated 22 (L) >60 mL/min    Comment: (NOTE) Calculated using the CKD-EPI Creatinine Equation (2021)    Anion gap 11 5 - 15    Comment: Performed at [facility], [place] [address] [place], [place] [address]  Troponin I (High Sensitivity)     Status: Abnormal   Collection Time: 05/05/22  6:33 PM  Result Value Ref Range   Troponin I (High Sensitivity) 100 (HH) <18 ng/L    Comment: CRITICAL RESULT CALLED TO, READ BACK BY AND VERIFIED WITH J,Severina Sykora RN '@2001'$  05/05/22 E,[place] (NOTE) Elevated high sensitivity troponin I (hsTnI) values and significant  changes across serial measurements may suggest ACS but many other  chronic and acute conditions are known to elevate hsTnI results.  Refer to the "Links" section for chest pain algorithms and additional  guidance. Performed at [facility], [place] [address], [place], [place] [address]   POC CBG, ED     Status: Abnormal   Collection Time: 05/05/22  6:45 PM  Result Value Ref Range   Glucose-Capillary 100 (H) 70 - 99 mg/dL    Comment: Glucose reference range applies only to samples taken after fasting for at least 8 hours.  Brain natriuretic peptide     Status: Abnormal   Collection Time: 05/05/22  7:00 PM  Result Value Ref Range   B Natriuretic Peptide 858.9 (H) 0.0 - 100.0 pg/mL    Comment: Performed at [facility] [address] [place],  [address]  D-dimer,  quantitative     Status: Abnormal   Collection Time: 05/05/22  8:00 PM  Result Value Ref Range   D-Dimer, Quant 1.23 (H) 0.00 - 0.50 ug/mL-FEU    Comment: (NOTE) At the manufacturer cut-off value of 0.5 g/mL FEU, this assay has a negative predictive value of 95-100%.This assay is intended for use in conjunction with a clinical pretest probability (PTP) assessment model  to exclude pulmonary embolism (PE) and deep venous thrombosis (DVT) in outpatients suspected of PE or DVT. Results should be correlated with clinical presentation. Performed at [facility], [place] [address], [place], [place] [address]   Troponin I (High Sensitivity)     Status: Abnormal   Collection Time: 05/05/22  8:00 PM  Result Value Ref Range   Troponin I (High Sensitivity) 107 (HH) <18 ng/L    Comment: CRITICAL VALUE NOTED. VALUE IS CONSISTENT WITH PREVIOUSLY REPORTED/CALLED VALUE (NOTE) Elevated high sensitivity troponin I (hsTnI) values and significant  changes across serial measurements may suggest ACS but many other  chronic and acute conditions are known to elevate hsTnI results.  Refer to the "Links" section for chest pain algorithms and additional  guidance. Performed at [facility], [place] [address], [place], [place] [address]    CT Head Wo Contrast  Result Date: 05/05/2022 CLINICAL DATA:  Left-sided weakness and facial droop EXAM: CT HEAD WITHOUT CONTRAST TECHNIQUE: Contiguous axial images were obtained from the base of the skull through the vertex without intravenous contrast. RADIATION DOSE REDUCTION: This exam was performed according to the departmental dose-optimization program which includes automated exposure control, adjustment of the mA and/or kV according to patient size and/or use of iterative reconstruction technique. COMPARISON:  None Available. FINDINGS: Brain: Acute intraparenchymal hemorrhage in the right thalamus measuring 2.1 x 1.8 x 1.4 cm. Mild edema. Parenchyma is otherwise  normal. No hydrocephalus or mass effect. Vascular: No hyperdense vessel or unexpected calcification. Skull: Normal. Negative for fracture or focal lesion. Sinuses/Orbits: No acute finding. Other: None. IMPRESSION: Acute hypertensive type hemorrhage in the right thalamus measuring 2.6 mL. Critical Value/emergent results were called by telephone at the time of interpretation on 05/05/2022 at 7:52 pm to provider Margaretmary Eddy , who verbally acknowledged these results. Electronically Signed   By: Ulyses Jarred M.D.   On: 05/05/2022 19:52   DG Chest Portable 1 View  Result Date: 05/05/2022 CLINICAL DATA:  Stroke, shortness of breath, chest pain, hypertension EXAM: PORTABLE CHEST 1 VIEW COMPARISON:  03/06/2022 FINDINGS: Cardiomegaly, vascular congestion. Perihilar and lower lobe airspace opacities. Possible small effusions. No acute bony abnormality. IMPRESSION: Cardiomegaly with vascular congestion and perihilar/lower lobe opacities. This could reflect edema. Suspect small layering effusions. Electronically Signed   By: Rolm Baptise M.D.   On: 05/05/2022 19:13    Pending Labs Unresulted Labs (From admission, onward)     Start     Ordered   05/05/22 2115  HIV Antibody (routine testing w rflx)  (HIV Antibody (Routine testing w reflex) panel)  Once,   R        05/05/22 2116   05/05/22 2110  Hemoglobin A1c  Once,   URGENT        05/05/22 2110   05/05/22 2110  Lipid panel  Once,   URGENT        05/05/22 2110   05/05/22 2110  Vitamin B12  Once,   URGENT        05/05/22 2110   05/05/22 2110  Folate  Once,   URGENT        05/05/22 2110            Vitals/Pain Today's Vitals   05/05/22 1848 05/05/22 2003 05/05/22 2030 05/05/22 2108  BP:  (!) 197/134 (!) 168/104 (!) 164/89  Pulse:  77 80 78  Resp:  16 (!) 26 (!) 28  Temp: 98.8 F (37.1 C)     TempSrc: Oral  SpO2:  96% 92% 93%  Weight:      Height:      PainSc:        Isolation Precautions No active isolations  Medications Medications    stroke: early stages of recovery book (has no administration in time range)  acetaminophen (TYLENOL) tablet 650 mg (has no administration in time range)    Or  acetaminophen (TYLENOL) 160 MG/5ML solution 650 mg (has no administration in time range)    Or  acetaminophen (TYLENOL) suppository 650 mg (has no administration in time range)  senna-docusate (Senokot-S) tablet 1 tablet (has no administration in time range)  pantoprazole (PROTONIX) injection 40 mg (has no administration in time range)  clevidipine (CLEVIPREX) infusion 0.5 mg/mL (has no administration in time range)  docusate sodium (COLACE) capsule 100 mg (has no administration in time range)  polyethylene glycol (MIRALAX / GLYCOLAX) packet 17 g (has no administration in time range)  ondansetron (ZOFRAN) injection 4 mg (has no administration in time range)  magnesium sulfate IVPB 2 g 50 mL (has no administration in time range)  albuterol (PROVENTIL) (2.5 MG/3ML) 0.083% nebulizer solution 2.5 mg (has no administration in time range)  amLODipine (NORVASC) tablet 10 mg (has no administration in time range)  carvedilol (COREG) tablet 25 mg (has no administration in time range)  atorvastatin (LIPITOR) tablet 80 mg (has no administration in time range)  furosemide (LASIX) injection 80 mg (80 mg Intravenous Given 05/05/22 2121)    Mobility Pt ambulates at baseline, but now requires minimum one assist due to left sided weakness, numbness.  Focused Assessments Neuro Assessment Handoff:  Swallow screen pass? Yes    NIH Stroke Scale  Dizziness Present: No Headache Present: Yes Interval: Initial Level of Consciousness (1a.)   : Alert, keenly responsive LOC Questions (1b. )   : Answers both questions correctly LOC Commands (1c. )   : Performs both tasks correctly Best Gaze (2. )  : Normal Visual (3. )  : No visual loss Facial Palsy (4. )    : Minor paralysis Motor Arm, Left (5a. )   : Some effort against gravity Motor Arm, Right  (5b. ) : No drift Motor Leg, Left (6a. )  : Some effort against gravity Motor Leg, Right (6b. ) : No drift Limb Ataxia (7. ): Present in one limb Sensory (8. )  : Mild-to-moderate sensory loss, patient feels pinprick is less sharp or is dull on the affected side, or there is a loss of superficial pain with pinprick, but patient is aware of being touched Best Language (9. )  : No aphasia Dysarthria (10. ): Normal Extinction/Inattention (11.)   : No Abnormality Complete NIHSS TOTAL: 7 Last date known well: 05/03/22 Last time known well: 1000 Neuro Assessment:   Neuro Checks:   Initial (05/05/22 1841)  Has TPA been given? No If patient is a Neuro Trauma and patient is going to OR before floor call report to [place] nurse: [phone] or [phone]   R Recommendations: See Admitting Provider Note  Report given to:   Additional Notes:  Pt is alert and oriented, cleveprex for BP control with goal 130-150. 18g RAC. On 2L [place]. Left sided weakness, numbness, very mild left sided facial drop with sensation deficits.

## 2022-05-05 NOTE — H&P (Cosign Needed Addendum)
NAME:  Torsten Weniger, MRN:  [id number], DOB:  January 27, 1966, LOS: 0 ADMISSION DATE:  05/05/2022, CONSULTATION DATE:  05/05/22 REFERRING MD:  [place] - neuro, CHIEF COMPLAINT:  L sided weakness    History of Present Illness:  57 yo PMH DM (lifestyle control) asthma,  HTN HLD gout hx tobacco use and current mj use, presented to ED 2/4 with L sided numbness and weakness. This started Friday morning but was mild. Progressed on Saturday to where his weakness was so severe he couldn't take his home medications. Ultimately he was brought to ED Sunday. Hypertensive in ED, started on clevi gtt.   Taken for CT H which revealed a hemorrhagic stroke, felt 2/2 ischemic stroke with hem conversion.  CXR with some possible pulm edema  Given medial comorbid conditions, pccm asked to admit   Pertinent  Medical History  Asthma HTN HLD Tobacco use MJ use  DM Rectal cancer   Significant Hospital Events: Including procedures, antibiotic start and stop dates in addition to other pertinent events   2/4 admit to ICU on clevi   Interim History / Subjective:  SBP 170 on clevi gtt [place]   Objective   Blood pressure (!) 164/89, pulse 78, temperature 98.8 F (37.1 C), temperature source Oral, resp. rate (!) 28, height '6\' 1"'$  (1.854 m), weight 102.1 kg, SpO2 93 %.        Intake/Output Summary (Last 24 hours) at 05/05/2022 2120 Last data filed at 05/05/2022 2028 Gross per 24 hour  Intake --  Output 200 ml  Net -200 ml   Filed Weights   05/05/22 1839  Weight: 102.1 kg    Examination: General: wdwn middle aged M NAD  HENT: NCAT pink mm anicteric sclera  Lungs: wheeze  Cardiovascular: rrr cap refill < 3 sec  Abdomen: soft ndnt  Extremities: no acute joint deformity Neuro: L sided weakness, AAOx4   GU: defer  Resolved Hospital Problem list    Assessment & Plan:   Hemorrhagic stroke -- felt likely hemorrhagic conversion of ischemic stroke  HTN -- with hypertensive emergency on presentation   Asthma HLD DM Gout  CKD IV  NAGMA Hyperkalemia  Chronic diastolic HF  Anemia of chronic disease  P -admit to ICU on clevit gtt -- goal SBP 130-150 given hem stroke -neuro imaging per neurology -no chemical vte ppx or antiplt given hem stroke -restart lasix tonight, other antihypertensives in AM -statin -check A1c, lipid panel   -echo  -2g mag, PRN albuterol -ok for PO diet - repeat bmp in AM    Best Practice (right click and "Reselect all SmartList Selections" daily)   Diet/type: Regular consistency (see orders) DVT prophylaxis: SCD GI prophylaxis: N/A Lines: N/A Foley:  N/A Code Status:  full code Last date of multidisciplinary goals of care discussion [--]  Labs   CBC: Recent Labs  Lab 05/05/22 1833  WBC 11.5*  HGB 10.5*  HCT 34.9*  MCV 89.5  PLT 412    Basic Metabolic Panel: Recent Labs  Lab 05/05/22 1833  NA 140  K 5.4*  CL 110  CO2 19*  GLUCOSE 101*  BUN 59*  CREATININE 3.14*  CALCIUM 7.6*   GFR: Estimated Creatinine Clearance: 32.2 mL/min (A) (by C-G formula based on SCr of 3.14 mg/dL (H)). Recent Labs  Lab 05/05/22 1833  WBC 11.5*    Liver Function Tests: Recent Labs  Lab 05/05/22 1833  AST 31  ALT 43  ALKPHOS 173*  BILITOT 0.5  PROT 7.0  ALBUMIN 3.3*  No results for input(s): "LIPASE", "AMYLASE" in the last 168 hours. No results for input(s): "AMMONIA" in the last 168 hours.  ABG    Component Value Date/Time   HCO3 26.5 (H) 06/14/2007 1959   TCO2 18 (L) 01/06/2020 1737     Coagulation Profile: No results for input(s): "INR", "PROTIME" in the last 168 hours.  Cardiac Enzymes: No results for input(s): "CKTOTAL", "CKMB", "CKMBINDEX", "TROPONINI" in the last 168 hours.  HbA1C: Hemoglobin A1C  Date/Time Value Ref Range Status  06/27/2021 08:59 AM 5.9 (A) 4.0 - 5.6 % Final   HbA1c, POC (controlled diabetic range)  Date/Time Value Ref Range Status  11/06/2021 08:55 AM 5.8 0.0 - 7.0 % Final  01/02/2021 03:47 PM 6.3  0.0 - 7.0 % Final    CBG: Recent Labs  Lab 05/05/22 1845  GLUCAP 100*    Review of Systems:   L sided weakness, L sided tingling  Wheeze SOB Increased appetite   Other systems negative  Past Medical History:  He,  has a past medical history of Asthma, CHF (congestive heart failure) ([place]), CKD (chronic kidney disease), stage IV ([place]), COVID-19 virus infection (04/2019), Diabetes mellitus without complication ([place]), Fatigue (11/23/2020), Gout, Hypertension, Leg heaviness (11/23/2020), NSVT (nonsustained ventricular tachycardia) ([place]) (11/23/2020), and Rectal cancer ([place]).   Surgical History:   Past Surgical History:  Procedure Laterality Date   BIOPSY  01/11/2020   Procedure: BIOPSY;  Surgeon: Irene Shipper, MD;  [facility] END[facility]COPY;  Service: Endoscopy;;   COLONOSCOPY WITH PROPOFOL N/A 01/11/2020   Procedure: COLONOSCOPY WITH PROPOFOL;  Surgeon: Irene Shipper, MD;  Location: [facility] ENDOSCOPY;  Service: Endoscopy;  Laterality: N/A;   NO PAST SURGERIES     POLYPECTOMY  01/11/2020   Procedure: POLYPECTOMY;  Surgeon: Irene Shipper, MD;  Location: [facility] ENDOSCOPY;  Service: Endoscopy;;   SUBMUCOSAL TATTOO INJECTION  01/11/2020   Procedure: SUBMUCOSAL TATTOO INJECTION;  Surgeon: Irene Shipper, MD;  Location: [facility] ENDOSCOPY;  Service: Endoscopy;;     Social History:   reports that he quit smoking about 2 years ago. His smoking use included cigarettes. He smoked an average of .5 packs per day. He has never used smokeless tobacco. He reports current alcohol use. He reports current drug use. Drug: Marijuana.   Family History:  His family history includes Heart failure in his brother.   Allergies No Known Allergies   Home Medications  Prior to Admission medications   Medication Sig Start Date End Date Taking? Authorizing Provider  albuterol (PROVENTIL) (2.5 MG/3ML) 0.083% nebulizer solution Take 3 mLs (2.5 mg total) by nebulization every 6 (six) hours as needed for wheezing or shortness of  breath. 03/06/22   Nyoka Lint, PA-C  albuterol (VENTOLIN HFA) 108 (90 Base) MCG/ACT inhaler Inhale 1-2 puffs into the lungs every 6 (six) hours as needed for wheezing or shortness of breath. 03/06/22   Nyoka Lint, PA-C  allopurinol (ZYLOPRIM) 100 MG tablet Take 1 tablet (100 mg total) by mouth daily. 12/17/21   Charlott Rakes, MD  amLODipine (NORVASC) 10 MG tablet Take 1 tablet (10 mg total) by mouth daily. 12/17/21   Charlott Rakes, MD  atorvastatin (LIPITOR) 80 MG tablet Take 1 tablet (80 mg total) by mouth daily. 12/17/21   Charlott Rakes, MD  Blood Glucose Monitoring Suppl (TRUE METRIX METER) w/Device KIT Check blood sugar fasting and ant bedtime and record 05/15/17   Charlott Rakes, MD  carvedilol (COREG) 25 MG tablet Take 1 tablet (25 mg total) by mouth 2 (two)  times daily with a meal. 12/17/21   Charlott Rakes, MD  colchicine 0.6 MG tablet Take 1 tablet by mouth as needed. Take 1 tablet (0.6 mg) by mouth at the onset of a gout attack with next dose no sooner than 3 days later 12/21/21   Charlott Rakes, MD  furosemide (LASIX) 20 MG tablet Take 3 tablets (60 mg total) by mouth 2 (two) times daily. 03/06/22   Nyoka Lint, PA-C  glucose blood (TRUE METRIX BLOOD GLUCOSE TEST) test strip Use as instructed 12/27/19   Andrew Au, MD  hydrALAZINE (APRESOLINE) 50 MG tablet Take 1 tablet (50 mg total) by mouth 2 (two) times daily. 12/17/21   Charlott Rakes, MD  Misc. Devices (DIGITAL GLASS SCALE) MISC Use scale to weight yourself. Increasing weight may indicate fluid overload 12/27/19   Andrew Au, MD  TRUEplus Lancets 28G MISC Check blood sugar fasting and at bedtime 12/27/19   Andrew Au, MD     Critical care time: 35 min     CRITICAL CARE Performed by: Cristal Generous   Total critical care time: 35 minutes  Critical care time was exclusive of separately billable procedures and treating other patients. Critical care was necessary to treat or prevent imminent or life-threatening  deterioration.  Critical care was time spent personally by me on the following activities: development of treatment plan with patient and/or surrogate as well as nursing, discussions with consultants, evaluation of patient's response to treatment, examination of patient, obtaining history from patient or surrogate, ordering and performing treatments and interventions, ordering and review of laboratory studies, ordering and review of radiographic studies, pulse oximetry and re-evaluation of patient's condition.  Eliseo Gum MSN, AGACNP-BC [place] for pager  05/05/2022, 9:21 PM

## 2022-05-05 NOTE — Progress Notes (Signed)
eLink Physician-Brief Progress Note Patient Name: Craig West DOB: [birth year] MRN: [id number]   Date of Service  05/05/2022  HPI/Events of Note  56/M with history of DM, presenting with L sided weakness and numbness that had started 2 days prior. CT head showed R thalamic hemorrhage.   eICU Interventions  Hemorrhagic stroke - Pt felt to ischemic stroke with hemorrhagic conversion - Serial neurochecks, plan for STAT CT if with acute clinical deterioration.  - Started on clevidipine drip. Will target SBP 130-124mHg - Will restart BP meds in AM, wean off clevidipine as warranted.  - Will check coags, reverse abnormalities as warranted - Monitor electrolytes, correct abnormalities as warranted.  - Lipid profile, A1C - Plan for echo in AM.         [name] 05/05/2022, 10:16 PM  7:00 AM Pt increasingly agitated, complaining of back pain. Also short of breath, wheezing.  Being given neb treatment at the moment.   One time dose of fentanyl ordered to address pain.  Will monitor response.

## 2022-05-05 NOTE — ED Triage Notes (Addendum)
Pt arrived via [facility] for chest pain, left sided weakness upper and lower extremity with numbness, left sided facial drop starting Friday morning, and hypertension. Pt reports numbness and weakness began Friday morning. Pt reports PMH asthma, increased work of breathing noted by EMS. Neb treatment administered and pt placed on 2L [place] with improvement in shortness of breath. Pt reports chest pain started yesterday as tightness. 324 ASA, 0.4 SL Nitro without improvement in symptoms.   PTA EMS Vitals  BP 193/134

## 2022-05-06 ENCOUNTER — Inpatient Hospital Stay (HOSPITAL_COMMUNITY): Payer: Medicaid Other

## 2022-05-06 DIAGNOSIS — G936 Cerebral edema: Secondary | ICD-10-CM | POA: Diagnosis not present

## 2022-05-06 DIAGNOSIS — I639 Cerebral infarction, unspecified: Secondary | ICD-10-CM

## 2022-05-06 DIAGNOSIS — I6389 Other cerebral infarction: Secondary | ICD-10-CM

## 2022-05-06 DIAGNOSIS — I161 Hypertensive emergency: Secondary | ICD-10-CM | POA: Diagnosis not present

## 2022-05-06 DIAGNOSIS — I619 Nontraumatic intracerebral hemorrhage, unspecified: Secondary | ICD-10-CM | POA: Diagnosis not present

## 2022-05-06 LAB — ECHOCARDIOGRAM COMPLETE
AR max vel: 2.5 cm2
AV Area VTI: 2.38 cm2
AV Area mean vel: 2.21 cm2
AV Mean grad: 6 mmHg
AV Peak grad: 11.4 mmHg
Ao pk vel: 1.69 m/s
Area-P 1/2: 4.49 cm2
Height: 73 in
S' Lateral: 2.8 cm
Weight: 3599.99 oz

## 2022-05-06 LAB — BASIC METABOLIC PANEL
Anion gap: 10 (ref 5–15)
BUN: 56 mg/dL — ABNORMAL HIGH (ref 6–20)
CO2: 21 mmol/L — ABNORMAL LOW (ref 22–32)
Calcium: 7.5 mg/dL — ABNORMAL LOW (ref 8.9–10.3)
Chloride: 108 mmol/L (ref 98–111)
Creatinine, Ser: 3.22 mg/dL — ABNORMAL HIGH (ref 0.61–1.24)
GFR, Estimated: 21 mL/min — ABNORMAL LOW (ref >60)
Glucose, Bld: 133 mg/dL — ABNORMAL HIGH (ref 70–99)
Potassium: 4.2 mmol/L (ref 3.5–5.1)
Sodium: 139 mmol/L (ref 135–145)

## 2022-05-06 LAB — GLUCOSE, CAPILLARY
Glucose-Capillary: 112 mg/dL — ABNORMAL HIGH (ref 70–99)
Glucose-Capillary: 113 mg/dL — ABNORMAL HIGH (ref 70–99)
Glucose-Capillary: 130 mg/dL — ABNORMAL HIGH (ref 70–99)
Glucose-Capillary: 170 mg/dL — ABNORMAL HIGH (ref 70–99)
Glucose-Capillary: 172 mg/dL — ABNORMAL HIGH (ref 70–99)
Glucose-Capillary: 81 mg/dL (ref 70–99)

## 2022-05-06 LAB — HIV ANTIBODY (ROUTINE TESTING W REFLEX): HIV Screen 4th Generation wRfx: NONREACTIVE

## 2022-05-06 MED ORDER — FUROSEMIDE 10 MG/ML IJ SOLN
40.0000 mg | Freq: Two times a day (BID) | INTRAMUSCULAR | Status: DC
  Administered 2022-05-06 – 2022-05-14 (×16): 40 mg via INTRAVENOUS
  Filled 2022-05-06 (×16): qty 4

## 2022-05-06 MED ORDER — QUETIAPINE FUMARATE 50 MG PO TABS
50.0000 mg | ORAL_TABLET | Freq: Two times a day (BID) | ORAL | Status: DC
  Administered 2022-05-06 – 2022-05-08 (×5): 50 mg via ORAL
  Filled 2022-05-06 (×2): qty 2
  Filled 2022-05-06 (×2): qty 1
  Filled 2022-05-06: qty 2

## 2022-05-06 MED ORDER — FUROSEMIDE 10 MG/ML IJ SOLN
40.0000 mg | Freq: Once | INTRAMUSCULAR | Status: AC
  Administered 2022-05-06: 40 mg via INTRAVENOUS
  Filled 2022-05-06: qty 4

## 2022-05-06 MED ORDER — LABETALOL HCL 5 MG/ML IV SOLN
10.0000 mg | INTRAVENOUS | Status: DC | PRN
  Administered 2022-05-06: 10 mg via INTRAVENOUS
  Filled 2022-05-06: qty 4

## 2022-05-06 MED ORDER — FENTANYL CITRATE PF 50 MCG/ML IJ SOSY
25.0000 ug | PREFILLED_SYRINGE | Freq: Once | INTRAMUSCULAR | Status: AC
  Administered 2022-05-06: 25 ug via INTRAVENOUS

## 2022-05-06 MED ORDER — NALOXONE HCL 0.4 MG/ML IJ SOLN: 0.4000 mg | INTRAMUSCULAR | Status: DC | PRN

## 2022-05-06 MED ORDER — HALOPERIDOL LACTATE 5 MG/ML IJ SOLN
INTRAMUSCULAR | Status: AC
  Filled 2022-05-06: qty 1

## 2022-05-06 MED ORDER — LORAZEPAM 2 MG/ML IJ SOLN: 1.0000 mg | Freq: Once | INTRAMUSCULAR | Status: DC

## 2022-05-06 MED ORDER — FUROSEMIDE 10 MG/ML IJ SOLN: 40.0000 mg | Freq: Two times a day (BID) | INTRAMUSCULAR | Status: DC

## 2022-05-06 MED ORDER — HALOPERIDOL LACTATE 5 MG/ML IJ SOLN
2.0000 mg | Freq: Once | INTRAMUSCULAR | Status: AC
  Administered 2022-05-06: 2 mg via INTRAVENOUS

## 2022-05-06 MED ORDER — FENTANYL CITRATE PF 50 MCG/ML IJ SOSY
25.0000 ug | PREFILLED_SYRINGE | Freq: Once | INTRAMUSCULAR | Status: AC
  Filled 2022-05-06: qty 1

## 2022-05-06 NOTE — Progress Notes (Addendum)
STROKE TEAM PROGRESS NOTE   INTERVAL HISTORY No family at the bedside. Cleviprex infusing and PO BP medications resumed.  Unable to do MRI due to agitation and he was extremely agitated overnight. Seroquel started for agitation.  Vitals:   05/06/22 0700 05/06/22 0715 05/06/22 0730 05/06/22 0800  BP:  (!) 140/82 124/81   Pulse: 78 65 66   Resp: (!) 32 (!) 24 (!) 21   Temp:    (!) 96.9 F (36.1 C)  TempSrc:    Axillary  SpO2: 96% 93% 93%   Weight:      Height:       CBC:  Recent Labs  Lab 05/05/22 1833  WBC 11.5*  HGB 10.5*  HCT 34.9*  MCV 89.5  PLT 161   Basic Metabolic Panel:  Recent Labs  Lab 05/05/22 1833 05/06/22 0327  NA 140 139  K 5.4* 4.2  CL 110 108  CO2 19* 21*  GLUCOSE 101* 133*  BUN 59* 56*  CREATININE 3.14* 3.22*  CALCIUM 7.6* 7.5*   Lipid Panel:  Recent Labs  Lab 05/05/22 2244  CHOL 95  TRIG 131  HDL 35*  CHOLHDL 2.7  VLDL 26  LDLCALC 34   HgbA1c:  Recent Labs  Lab 05/05/22 2244  HGBA1C 5.7*   Urine Drug Screen: No results for input(s): "LABOPIA", "COCAINSCRNUR", "LABBENZ", "AMPHETMU", "THCU", "LABBARB" in the last 168 hours.  Alcohol Level No results for input(s): "ETH" in the last 168 hours.  IMAGING past 24 hours DG CHEST PORT 1 VIEW  Result Date: 05/06/2022 CLINICAL DATA:  Congestive heart failure. EXAM: PORTABLE CHEST 1 VIEW COMPARISON:  05/05/2022 FINDINGS: Stable cardiomegaly. Symmetric bilateral lower lung airspace disease shows no significant change. Small layering bilateral pleural effusions cannot be excluded. IMPRESSION: No significant change in cardiomegaly and bilateral lower lung airspace disease. Possible small layering bilateral pleural effusions. Electronically Signed   By: Marlaine Hind M.D.   On: 05/06/2022 08:03   CT HEAD WO CONTRAST (5MM)  Result Date: 05/06/2022 CLINICAL DATA:  Intracranial hemorrhage follow up EXAM: CT HEAD WITHOUT CONTRAST TECHNIQUE: Contiguous axial images were obtained from the base of the skull  through the vertex without intravenous contrast. RADIATION DOSE REDUCTION: This exam was performed according to the departmental dose-optimization program which includes automated exposure control, adjustment of the mA and/or kV according to patient size and/or use of iterative reconstruction technique. COMPARISON:  05/05/2022 FINDINGS: Brain: Unchanged size of intraparenchymal hematoma in the right thalamus. No new hemorrhage. Brain parenchyma otherwise normal. Vascular: No hyperdense vessel or unexpected calcification. Skull: Normal Sinuses/Orbits: Negative Other: None IMPRESSION: Unchanged size of intraparenchymal hematoma in the right thalamus. No new hemorrhage. Electronically Signed   By: Ulyses Jarred M.D.   On: 05/06/2022 01:58   CT Head Wo Contrast  Result Date: 05/05/2022 CLINICAL DATA:  Left-sided weakness and facial droop EXAM: CT HEAD WITHOUT CONTRAST TECHNIQUE: Contiguous axial images were obtained from the base of the skull through the vertex without intravenous contrast. RADIATION DOSE REDUCTION: This exam was performed according to the departmental dose-optimization program which includes automated exposure control, adjustment of the mA and/or kV according to patient size and/or use of iterative reconstruction technique. COMPARISON:  None Available. FINDINGS: Brain: Acute intraparenchymal hemorrhage in the right thalamus measuring 2.1 x 1.8 x 1.4 cm. Mild edema. Parenchyma is otherwise normal. No hydrocephalus or mass effect. Vascular: No hyperdense vessel or unexpected calcification. Skull: Normal. Negative for fracture or focal lesion. Sinuses/Orbits: No acute finding. Other: None. IMPRESSION: Acute hypertensive  type hemorrhage in the right thalamus measuring 2.6 mL. Critical Value/emergent results were called by telephone at the time of interpretation on 05/05/2022 at 7:52 pm to provider Margaretmary Eddy , who verbally acknowledged these results. Electronically Signed   By: Ulyses Jarred M.D.    On: 05/05/2022 19:52   DG Chest Portable 1 View  Result Date: 05/05/2022 CLINICAL DATA:  Stroke, shortness of breath, chest pain, hypertension EXAM: PORTABLE CHEST 1 VIEW COMPARISON:  03/06/2022 FINDINGS: Cardiomegaly, vascular congestion. Perihilar and lower lobe airspace opacities. Possible small effusions. No acute bony abnormality. IMPRESSION: Cardiomegaly with vascular congestion and perihilar/lower lobe opacities. This could reflect edema. Suspect small layering effusions. Electronically Signed   By: Rolm Baptise M.D.   On: 05/05/2022 19:13    PHYSICAL EXAM Constitutional: Appears well-developed and well-nourished.  Cardiovascular: Normal rate and regular rhythm. Perfusing extremities well Respiratory: Audibly wheezing and very quickly short of breath on strength testing   Neuro: Mental Status: Patient is awake,drowsy, oriented to person, place, month, year, and situation. Patient is able to give a clear and coherent history.  No signs of aphasia or neglect Cranial Nerves: II:  Pupils are equal, round, and reactive to light.   III,IV, VI: EOMI without ptosis or diploplia.  V: Facial sensation is symmetric to temperature VII: left facial droop  VIII: hearing is intact to voice X: Uvula elevates symmetrically XI: Shoulder shrug is symmetric. XII: tongue is midline without atrophy or fasciculations.  Motor: Tone is normal. Bulk is normal. 5/5 strength was present in on the right side.  On the left 3/5 in the upper extremity, 3/5 lower extremity Sensory: Left side no sensation  Deep Tendon Reflexes: Hypoactive throughout Plantars: Toes are mute on the left, downgoing on the right Cerebellar: FNF and HKS are intact within limits of weakness Gait:  Deferred for safety  ASSESSMENT/PLAN [name] is a 57 y.o. male with history of hypertension, diabetes (reports diet and exercise controlled), hyperlipidemia, asthma, remote smoking (currently smoking marijuana once or twice  daily), gout presenting with left sided numbness and weakness.   Stroke:  Right thalamic hemorrhage Etiology:  likely due to uncontrolled HTN vs stroke with hemorrhagic conversion Code Stroke CT head Acute hypertensive type hemorrhage in the right thalamus measuring 2.6 mL. Repeat CT Head- Unchanged size of intraparenchymal hematoma in the right thalamus. No new hemorrhage. MRI  Pending MRA Head pending  2D Echo EF is 18-56%, grade 2 diastolic dysfunction, right atrium mildly dilated LDL 34 HgbA1c 5.7 VTE prophylaxis -SCDs    Diet   Diet Heart Room service appropriate? Yes; Fluid consistency: Thin   No antithrombotic prior to admission, now on No antithrombotic.  Therapy recommendations:  Pending Disposition:  Pending  Hypertension Congestive Heart failure Home meds:  Amlodipine, coreg, hydralazine, lasix Unstable BP goal systolic less than 314 Long-term BP goal normotensive  Hyperlipidemia Home meds:  Atorvastatin '80mg'$ , resumed in hospital LDL 34, goal < 70 Continue statin at discharge  Diabetes type II Controlled Home meds:  None HgbA1c 5.7, goal < 7.0 CBGs Recent Labs    05/05/22 2321 05/06/22 0314 05/06/22 0712  GLUCAP 193* 130* 172*    SSI  Other Stroke Risk Factors Marijuana use Congestive heart failure  Other Active Problems Gout Allopurinol, colchicine Asthma 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 3.22, BUN 56, GFR 21 Agitation Seroquel '50mg'$  BID started 2/4  Hospital day # 1  Patient seen and examined by NP/APP with MD. MD to update note as needed.  Janine Ores, DNP, FNP-BC [facility] Pager: [phone]   ATTENDING ATTESTATION:  57 year old with intracranial hemorrhage right thalamus with some edema.  Protecting airway and not requiring intubation.  He is severely agitated on Seroquel.  MRI was attempted today but not completed due to agitation.  Will hold off for now on MRI and get head CT to assess for stability..  Goal systolic  blood pressure less than 140.    Appreciate CCM assistance.  ADDENDUM: Repeat CT stable. Con't current care.   Dr. Reeves Forth evaluated pt independently, reviewed imaging, chart, labs. Discussed and formulated plan with the Resident/APP. Changes were made to the note where appropriate. Please see APP/resident note above for details.      This patient is critically ill due to intracranial hemorrhage and at significant risk of neurological worsening, death form heart failure, respiratory failure, recurrent stroke, bleeding from [facility], seizure, sepsis. This patient's care requires constant monitoring of vital signs, hemodynamics, respiratory and cardiac monitoring, review of multiple databases, neurological assessment, discussion with family, other specialists and medical decision making of high complexity. I spent 35 minutes of neurocritical care time in the care of this patient.   Fairley Copher,MD   To contact Stroke Continuity provider, please refer to [email or web address] After hours, contact General Neurology

## 2022-05-06 NOTE — Progress Notes (Signed)
PT Cancellation Note  Patient Details Name: Craig West MRN: [id number] DOB: 03/24/1966   Cancelled Treatment:    Reason Eval/Treat Not Completed: Medical issues which prohibited therapy;Patient not medically ready.  Pt very agitated, RN stated hold today when asked if should try later. 05/06/2022  Ginger Carne., PT Acute Rehabilitation Services [phone]  (office)   Tessie Fass Gwen Edler 05/06/2022, 10:58 AM

## 2022-05-06 NOTE — Progress Notes (Signed)
OT Cancellation Note  Patient Details Name: Craig West MRN: [id number] DOB: Dec 24, 1965   Cancelled Treatment:    Reason Eval/Treat Not Completed: Medical issues which prohibited therapy;Active bedrest order and pt currently sedated secondary to agitation.  Will check back tomorrow for appropriateness and attempt to proceed with OT services if pt is appropriate.    Joden Bonsall OTR/L 05/06/2022, 11:18 AM

## 2022-05-06 NOTE — Progress Notes (Signed)
SLP Cancellation Note  Patient Details Name: Craig West MRN: [id number] DOB: 1965/06/17   Cancelled treatment:        Pt was very agitated earlier today RN told PT/OT to hold today when asked if should try later. Currently sleeping soundly. Will reattempt tomorrow for speech-language-cognitive assessment.    [place] Siren 05/06/2022, 11:59 AM

## 2022-05-06 NOTE — Progress Notes (Signed)
Starting at 0600 patient was trying to get out of bed demanding to sit on the side. RN told patient he could not get out of bed for his safety. Patient became more agitated and short of breath. Patient stated he was uncomfortable and in pain "100 out of 10". Patient seemed to be more panicked. CCM and Neuro MD notified. Haldol '5mg'$ /ml and 25 mcg of fentanyl ordered. Neuro exam stayed the same A&Ox4, became more agitated and panicked compared to rest of the shift.

## 2022-05-06 NOTE — Progress Notes (Signed)
NAME:  Craig West, MRN:  [id number], DOB:  [birth year], LOS: 1 ADMISSION DATE:  05/05/2022, CONSULTATION DATE:  05/05/22 REFERRING MD:  Curly [place] - neuro, CHIEF COMPLAINT:  L sided weakness    History of Present Illness:  57 yo PMH DM (lifestyle control) asthma,  HTN HLD gout hx tobacco use and current mj use, presented to ED 2/4 with L sided numbness and weakness. This started Friday morning but was mild. Progressed on Saturday to where his weakness was so severe he couldn't take his home medications. Ultimately he was brought to ED Sunday. Hypertensive in ED, started on clevi gtt.   Taken for CT H which revealed a hemorrhagic stroke, felt 2/2 ischemic stroke with hem conversion.  CXR with some possible pulm edema  Given medial comorbid conditions, pccm asked to admit   Pertinent  Medical History  Asthma HTN HLD Tobacco use MJ use  DM Rectal cancer   Significant Hospital Events: Including procedures, antibiotic start and stop dates in addition to other pertinent events   2/4 admit to ICU on clevi   Interim History / Subjective:  Patient was agitated overnight Remains on nasal cannula oxygen, afebrile Could not get MRI brain done because he became agitated and restless Still on Cleviprex infusion  Objective   Blood pressure 115/83, pulse (!) 54, temperature (!) 96.9 F (36.1 C), temperature source Axillary, resp. rate 18, height '6\' 1"'$  (1.854 m), weight 102.1 kg, SpO2 96 %.        Intake/Output Summary (Last 24 hours) at 05/06/2022 0926 Last data filed at 05/06/2022 0900 Gross per 24 hour  Intake 837.34 ml  Output 1051 ml  Net -213.66 ml   Filed Weights   05/05/22 1839 05/06/22 0246  Weight: 102.1 kg 102.1 kg    Examination: Physical exam: General: Middle-aged male, lying on the bed HEENT: [place]/AT, eyes anicteric.  moist mucus membranes Neuro: Lethargic, opens eyes with vocal stimuli, following simple commands.  Plegic left upper extremity, weaker left lower extremity, moving  right side Chest: Coarse breath sounds, no wheezes or rhonchi Heart: Regular rate and rhythm, no murmurs or gallops Abdomen: Soft, nontender, nondistended, bowel sounds present Skin: No rash  Resolved Hospital Problem list    Assessment & Plan:  Acute right thalamic intraparenchymal hemorrhage Probable right thalamic acute ischemic stroke with hemorrhagic conversion Cerebral edema Patient presented late after he started with symptoms on Friday CT scan showed stability of intraparenchymal hemorrhage Patient could not have MRI because he was agitated and restless Continue neuro watch every hour Maintain SBP 130-150 Avoid hypotonic fluid  Hypertensive emergency Patient presented with with SBP in 170s with intraparenchymal hemorrhage Currently on Cleviprex infusion Resume home antihypertensive meds including Coreg and amlodipine Hold hydralazine Maintain SBP 130-150, try to titrate off Cleviprex  Acute delirium Continue delirium precautions Started on Seroquel twice daily  CKD stage IV Non-anion gap metabolic acidosis, improving Serum creatinine is at baseline Monitor intake and output Avoid nephrotoxic agents  Prediabetes Patient hemoglobin A1c is 5.7 Continue sliding scale insulin with CBG goal 140-180  Chronic HFpEF Monitor intake and output Avoid nephrotoxic agent Repeat echocardiogram is pending Resume home Lasix On Coreg, not on ACE inhibitor/ARB due to CKD  Anemia of chronic disease  Monitor H and H Transfuse if less than 7  Best Practice (right click and "Reselect all SmartList Selections" daily)   Diet/type: Regular consistency (see orders) DVT prophylaxis: SCD GI prophylaxis: N/A Lines: N/A Foley:  N/A Code Status:  full code Last date  of multidisciplinary goals of care discussion [pending]  Labs   CBC: Recent Labs  Lab 05/05/22 1833  WBC 11.5*  HGB 10.5*  HCT 34.9*  MCV 89.5  PLT 786    Basic Metabolic Panel: Recent Labs  Lab  05/05/22 1833 05/06/22 0327  NA 140 139  K 5.4* 4.2  CL 110 108  CO2 19* 21*  GLUCOSE 101* 133*  BUN 59* 56*  CREATININE 3.14* 3.22*  CALCIUM 7.6* 7.5*   GFR: Estimated Creatinine Clearance: 31.4 mL/min (A) (by C-G formula based on SCr of 3.22 mg/dL (H)). Recent Labs  Lab 05/05/22 1833  WBC 11.5*    Liver Function Tests: Recent Labs  Lab 05/05/22 1833  AST 31  ALT 43  ALKPHOS 173*  BILITOT 0.5  PROT 7.0  ALBUMIN 3.3*   No results for input(s): "LIPASE", "AMYLASE" in the last 168 hours. No results for input(s): "AMMONIA" in the last 168 hours.  ABG    Component Value Date/Time   HCO3 26.5 (H) 06/14/2007 1959   TCO2 18 (L) 01/06/2020 1737     Coagulation Profile: Recent Labs  Lab 05/05/22 2244  INR 1.6*    Cardiac Enzymes: No results for input(s): "CKTOTAL", "CKMB", "CKMBINDEX", "TROPONINI" in the last 168 hours.  HbA1C: Hemoglobin A1C  Date/Time Value Ref Range Status  06/27/2021 08:59 AM 5.9 (A) 4.0 - 5.6 % Final   HbA1c, POC (controlled diabetic range)  Date/Time Value Ref Range Status  11/06/2021 08:55 AM 5.8 0.0 - 7.0 % Final  01/02/2021 03:47 PM 6.3 0.0 - 7.0 % Final   Hgb A1c MFr Bld  Date/Time Value Ref Range Status  05/05/2022 10:44 PM 5.7 (H) 4.8 - 5.6 % Final    Comment:    (NOTE) Pre diabetes:          5.7%-6.4%  Diabetes:              >6.4%  Glycemic control for   <7.0% adults with diabetes     CBG: Recent Labs  Lab 05/05/22 1845 05/05/22 2321 05/06/22 0314 05/06/22 0712  GLUCAP 100* 193* 130* 172*    This patient is critically ill with multiple organ system failure which requires frequent high complexity decision making, assessment, support, evaluation, and titration of therapies. This was completed through the application of advanced monitoring technologies and extensive interpretation of multiple databases.  During this encounter critical care time was devoted to patient care services described in this note for 41  minutes.    Jacky Kindle, MD [facility] See Amion for pager If no response to pager, please call [phone] until 7pm After 7pm, Please call E-link [phone]

## 2022-05-06 NOTE — Progress Notes (Signed)
Pt transferred to MRI, got very agitated, screamed, tried to get out from the bed. (New bottle of Clevoprex was broken during.)  MRI was unable to perform.Pt returned to the room. VS stabe

## 2022-05-07 ENCOUNTER — Inpatient Hospital Stay (HOSPITAL_COMMUNITY): Payer: Medicaid Other

## 2022-05-07 DIAGNOSIS — I619 Nontraumatic intracerebral hemorrhage, unspecified: Secondary | ICD-10-CM | POA: Diagnosis not present

## 2022-05-07 DIAGNOSIS — I161 Hypertensive emergency: Secondary | ICD-10-CM | POA: Diagnosis not present

## 2022-05-07 DIAGNOSIS — R079 Chest pain, unspecified: Secondary | ICD-10-CM | POA: Diagnosis not present

## 2022-05-07 DIAGNOSIS — I639 Cerebral infarction, unspecified: Secondary | ICD-10-CM | POA: Diagnosis not present

## 2022-05-07 LAB — CBC WITH DIFFERENTIAL/PLATELET
Abs Immature Granulocytes: 0.05 10*3/uL (ref 0.00–0.07)
Basophils Absolute: 0.1 10*3/uL (ref 0.0–0.1)
Basophils Relative: 1 %
Eosinophils Absolute: 0.4 10*3/uL (ref 0.0–0.5)
Eosinophils Relative: 3 %
HCT: 34.7 % — ABNORMAL LOW (ref 39.0–52.0)
Hemoglobin: 10.5 g/dL — ABNORMAL LOW (ref 13.0–17.0)
Immature Granulocytes: 1 %
Lymphocytes Relative: 6 %
Lymphs Abs: 0.7 10*3/uL (ref 0.7–4.0)
MCH: 26.4 pg (ref 26.0–34.0)
MCHC: 30.3 g/dL (ref 30.0–36.0)
MCV: 87.4 fL (ref 80.0–100.0)
Monocytes Absolute: 0.6 10*3/uL (ref 0.1–1.0)
Monocytes Relative: 6 %
Neutro Abs: 8.6 10*3/uL — ABNORMAL HIGH (ref 1.7–7.7)
Neutrophils Relative %: 83 %
Platelets: 257 10*3/uL (ref 150–400)
RBC: 3.97 MIL/uL — ABNORMAL LOW (ref 4.22–5.81)
RDW: 18.6 % — ABNORMAL HIGH (ref 11.5–15.5)
WBC: 10.3 10*3/uL (ref 4.0–10.5)
nRBC: 0 % (ref 0.0–0.2)

## 2022-05-07 LAB — GLUCOSE, CAPILLARY
Glucose-Capillary: 86 mg/dL (ref 70–99)
Glucose-Capillary: 87 mg/dL (ref 70–99)
Glucose-Capillary: 210 mg/dL — ABNORMAL HIGH (ref 70–99)
Glucose-Capillary: 132 mg/dL — ABNORMAL HIGH (ref 70–99)
Glucose-Capillary: 179 mg/dL — ABNORMAL HIGH (ref 70–99)
Glucose-Capillary: 137 mg/dL — ABNORMAL HIGH (ref 70–99)

## 2022-05-07 LAB — BASIC METABOLIC PANEL
Anion gap: 5 (ref 5–15)
BUN: 50 mg/dL — ABNORMAL HIGH (ref 6–20)
CO2: 24 mmol/L (ref 22–32)
Calcium: 7.4 mg/dL — ABNORMAL LOW (ref 8.9–10.3)
Chloride: 108 mmol/L (ref 98–111)
Creatinine, Ser: 2.95 mg/dL — ABNORMAL HIGH (ref 0.61–1.24)
GFR, Estimated: 24 mL/min — ABNORMAL LOW (ref >60)
Glucose, Bld: 199 mg/dL — ABNORMAL HIGH (ref 70–99)
Potassium: 4.3 mmol/L (ref 3.5–5.1)
Sodium: 137 mmol/L (ref 135–145)

## 2022-05-07 MED ORDER — DIAZEPAM 5 MG/ML IJ SOLN
2.5000 mg | INTRAMUSCULAR | Status: DC | PRN
  Filled 2022-05-07: qty 2

## 2022-05-07 MED ORDER — PANTOPRAZOLE SODIUM 40 MG PO TBEC
40.0000 mg | DELAYED_RELEASE_TABLET | Freq: Every day | ORAL | Status: DC
  Administered 2022-05-07 – 2022-05-13 (×7): 40 mg via ORAL
  Filled 2022-05-07 (×7): qty 1

## 2022-05-07 MED ORDER — HYDRALAZINE HCL 50 MG PO TABS: 50.0000 mg | ORAL_TABLET | Freq: Four times a day (QID) | ORAL | Status: DC

## 2022-05-07 MED ORDER — HYDRALAZINE HCL 50 MG PO TABS
50.0000 mg | ORAL_TABLET | Freq: Four times a day (QID) | ORAL | Status: DC
  Administered 2022-05-07 – 2022-05-08 (×5): 50 mg via ORAL
  Filled 2022-05-07 (×5): qty 1

## 2022-05-07 MED ORDER — INSULIN ASPART 100 UNIT/ML IJ SOLN
0.0000 [IU] | INTRAMUSCULAR | Status: DC
  Administered 2022-05-07: 1 [IU] via SUBCUTANEOUS
  Administered 2022-05-07: 3 [IU] via SUBCUTANEOUS
  Administered 2022-05-07 – 2022-05-08 (×3): 2 [IU] via SUBCUTANEOUS
  Administered 2022-05-08 – 2022-05-09 (×3): 1 [IU] via SUBCUTANEOUS
  Administered 2022-05-09 (×3): 2 [IU] via SUBCUTANEOUS
  Administered 2022-05-09: 1 [IU] via SUBCUTANEOUS
  Administered 2022-05-10 (×3): 2 [IU] via SUBCUTANEOUS
  Administered 2022-05-10: 3 [IU] via SUBCUTANEOUS

## 2022-05-07 NOTE — Plan of Care (Signed)
  Problem: Education: Goal: Knowledge of disease or condition will improve Outcome: Not Progressing Goal: Knowledge of secondary prevention will improve (MUST DOCUMENT ALL) Outcome: Not Progressing Goal: Knowledge of patient specific risk factors will improve [place]ta [place] N/A or DELETE if not current risk factor) Outcome: Not Progressing   Problem: Intracerebral Hemorrhage Tissue Perfusion: Goal: Complications of Intracerebral Hemorrhage will be minimized Outcome: Not Progressing   Problem: Coping: Goal: Will verbalize positive feelings about self Outcome: Not Progressing Goal: Will identify appropriate support needs Outcome: Not Progressing   Problem: Health Behavior/Discharge Planning: Goal: Ability to manage health-related needs will improve Outcome: Not Progressing Goal: Goals will be collaboratively established with patient/family Outcome: Not Progressing   Problem: Self-Care: Goal: Ability to participate in self-care as condition permits will improve Outcome: Not Progressing Goal: Verbalization of feelings and concerns over difficulty with self-care will improve Outcome: Not Progressing Goal: Ability to communicate needs accurately will improve Outcome: Not Progressing   Problem: Nutrition: Goal: Risk of aspiration will decrease Outcome: Not Progressing Goal: Dietary intake will improve Outcome: Not Progressing   Problem: Education: Goal: Ability to describe self-care measures that may prevent or decrease complications (Diabetes Survival Skills Education) will improve Outcome: Not Progressing Goal: Individualized Educational Video(s) Outcome: Not Progressing   Problem: Coping: Goal: Ability to adjust to condition or change in health will improve Outcome: Not Progressing   Problem: Fluid Volume: Goal: Ability to maintain a balanced intake and output will improve Outcome: Not Progressing   Problem: Health Behavior/Discharge Planning: Goal: Ability to identify and  utilize available resources and services will improve Outcome: Not Progressing Goal: Ability to manage health-related needs will improve Outcome: Not Progressing   Problem: Metabolic: Goal: Ability to maintain appropriate glucose levels will improve Outcome: Not Progressing   Problem: Nutritional: Goal: Maintenance of adequate nutrition will improve Outcome: Not Progressing Goal: Progress toward achieving an optimal weight will improve Outcome: Not Progressing   Problem: Skin Integrity: Goal: Risk for impaired skin integrity will decrease Outcome: Not Progressing   Problem: Tissue Perfusion: Goal: Adequacy of tissue perfusion will improve Outcome: Not Progressing

## 2022-05-07 NOTE — Evaluation (Signed)
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   BPH (benign prostatic hyperplasia) 03/27/2021   Fatigue 11/23/2020   Leg heaviness 11/23/2020   NSVT (nonsustained ventricular tachycardia) ([place]) 11/23/2020   Diabetes mellitus without complication ([place])    CKD (chronic kidney disease), stage IV (HCC)    PICC (peripherally inserted central catheter) in place 03/27/2020   Rectal cancer ([place]) 01/27/2020   Benign neoplasm of ascending colon    Benign neoplasm of descending colon    Benign neoplasm of sigmoid colon    Rectal mass    Rectal bleeding    Iron deficiency anemia    Chronic diastolic heart failure (HCC)    Hyperkalemia    Volume overload 01/06/2020   Demand ischemia    COVID-19 virus infection 04/2019   Obesity (BMI 30.0-34.9) 12/09/2017   Non compliance w medication regimen 04/18/2017   Gout 08/02/2016   Diabetic neuropathy ([place]) 08/02/2016   Hyperlipidemia 10/24/2015   Asthma 10/16/2015   Type 2 diabetes mellitus without complication ([place]) 30/11/2328   Essential hypertension 06/13/2014   Smoker 06/13/2014   Past Medical History:  Past Medical History:  Diagnosis Date   Asthma    CHF (congestive heart failure) ([place])    CKD (chronic kidney disease), stage IV ([place])    COVID-19 virus infection 04/2019   Diabetes mellitus without complication (HCC)    Fatigue 11/23/2020   Gout    Hypertension    Leg heaviness 11/23/2020   NSVT (nonsustained ventricular tachycardia) ([place]) 11/23/2020   Rectal cancer [facility])    Past Surgical History:  Past Surgical History:  Procedure Laterality Date   BIOPSY   01/11/2020   Procedure: BIOPSY;  Surgeon: Irene Shipper, MD;  Location: [facility];  Service: Endoscopy;;   COLONOSCOPY WITH PROPOFOL N/A 01/11/2020   Procedure: COLONOSCOPY WITH PROPOFOL;  Surgeon: Irene Shipper, MD;  Location: [facility] ENDOSCOPY;  Service: Endoscopy;  Laterality: N/A;   NO PAST SURGERIES     POLYPECTOMY  01/11/2020   Procedure: POLYPECTOMY;  Surgeon: Irene Shipper, MD;  Location: [facility] ENDOSCOPY;  Service: Endoscopy;;   SUBMUCOSAL TATTOO INJECTION  01/11/2020   Procedure: SUBMUCOSAL TATTOO INJECTION;  Surgeon: Irene Shipper, MD;  Location: [facility] ENDOSCOPY;  Service: Endoscopy;;   HPI:  Patient is a 57 y/o male who presents on 2/4 with chest pain and left sided weakness as well as facial droop. 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 CXR-pulmonary edema. PMH includes DM (lifestyle control) asthma, HTN, gout, hx tobacco use and current marijuana use.   Assessment / Plan / Recommendation Clinical Impression  Pt lethargic today - he was able to awaken for brief moments to participate in limited speech-language evaluation. Demonstrates left facial weakness, moderate dysarthria of speech with dysfluencies.  He was oriented to person and stated he'd had a stroke; identified year as 42.  He demonstrated poor awareness, attention, and recall at this time, however he needed max cues to stay awake.  Session ceased - SLP will follow for ongoing assessment of cognition/communication as MS improves.    SLP Assessment  SLP Recommendation/Assessment: Patient needs continued [facility] SLP Visit  Diagnosis: Cognitive communication deficit (R41.841)    Recommendations for follow up therapy are one component of a multi-disciplinary discharge planning process, led by the attending physician.  Recommendations may be updated based on patient status, additional functional criteria and insurance authorization.    Follow Up  Recommendations    Skilled nursing facility   Assistance Recommended at Discharge   Intermittent supervision      Frequency and Duration min 2x/week  2 weeks      SLP Evaluation Cognition  Overall Cognitive Status: Impaired/Different from baseline Arousal/Alertness: Lethargic Orientation Level: Oriented to person;Disoriented to time;Oriented to situation Attention: Sustained Sustained Attention: Impaired Sustained Attention Impairment: Verbal basic Memory: Impaired Memory Impairment: Retrieval deficit Awareness: Impaired Awareness Impairment: Intellectual impairment       Comprehension  Auditory Comprehension Overall Auditory Comprehension:  (tba)    Expression Expression Primary Mode of Expression: Verbal Verbal Expression Overall Verbal Expression: Impaired Initiation: Impaired Written Expression Dominant Hand: Right   Oral / Motor  Oral Motor/Sensory Function Overall Oral Motor/Sensory Function: Moderate impairment Facial Symmetry: Abnormal symmetry left;Suspected CN VII (facial) dysfunction Motor Speech Overall Motor Speech: Impaired Phonation: Wet Resonance: Within functional limits Articulation: Impaired Level of Impairment: Phrase Intelligibility: Intelligibility reduced Word: 75-100% accurate Phrase: 50-74% accurate Motor Planning: Witnin functional limits (dysfluencies noted)            Juan Quam Laurice 05/07/2022, 11:15 AM Estill Bamberg L. Tivis Ringer, MA CCC/SLP Clinical Specialist - [place] Office number [phone]

## 2022-05-07 NOTE — Progress Notes (Signed)
OT Cancellation Note  Patient Details Name: Jaydis Duchene MRN: [id number] DOB: [birth year]   Cancelled Treatment:    Reason Eval/Treat Not Completed: Medical issues which prohibited therapy (Active bedrest order.  Will continue to follow and attempt to proceed with OT services when pt is medically appropriate.)  Ailene Ravel, OTR/L,CBIS  Supplemental OT - [facility] Secure Chat Preferred   05/07/2022, 8:36 AM

## 2022-05-07 NOTE — Progress Notes (Signed)
NAME:  Craig West, MRN:  [id number], DOB:  [birth year], LOS: 2 ADMISSION DATE:  05/05/2022, CONSULTATION DATE:  05/05/22 REFERRING MD:  [place] - neuro, CHIEF COMPLAINT:  L sided weakness    History of Present Illness:  57 yo PMH DM (lifestyle control) asthma,  HTN HLD gout hx tobacco use and current mj use, presented to ED 2/4 with L sided numbness and weakness. This started Friday morning but was mild. Progressed on Saturday to where his weakness was so severe he couldn't take his home medications. Ultimately he was brought to ED Sunday. Hypertensive in ED, started on clevi gtt.   Taken for CT H which revealed a hemorrhagic stroke, felt 2/2 ischemic stroke with hem conversion.  CXR with some possible pulm edema  Given medial comorbid conditions, pccm asked to admit   Pertinent  Medical History  Asthma HTN HLD Tobacco use MJ use  DM Rectal cancer   Significant Hospital Events: Including procedures, antibiotic start and stop dates in addition to other pertinent events   2/4 admit to ICU on clevi   Interim History / Subjective:  Patient remained afebrile Continue to require Cleviprex infusion despite multiple oral antihypertensive meds  Objective   Blood pressure (!) 156/78, pulse 66, temperature (!) 97.2 F (36.2 C), temperature source Axillary, resp. rate 19, height '6\' 1"'$  (1.854 m), weight 102.1 kg, SpO2 98 %.        Intake/Output Summary (Last 24 hours) at 05/07/2022 0849 Last data filed at 05/07/2022 0600 Gross per 24 hour  Intake 258.95 ml  Output 3300 ml  Net -3041.05 ml   Filed Weights   05/05/22 1839 05/06/22 0246  Weight: 102.1 kg 102.1 kg    Examination: Physical exam: General: Middle-aged male, lying on the bed HEENT: [place]/AT, eyes anicteric.  moist mucus membranes Neuro: Alert, awake following commands Chest: Coarse breath sounds, no wheezes or rhonchi Heart: Regular rate and rhythm, no murmurs or gallops Abdomen: Soft, nontender, nondistended, bowel sounds  present Skin: No rash  Labs and images were reviewed  Resolved Hospital Problem list    Assessment & Plan:  Acute right thalamic intraparenchymal hemorrhage Probable right thalamic acute ischemic stroke with hemorrhagic conversion Cerebral edema Neurology exam is stable Continue neuro watch every hour Avoid antiplatelet and anticoagulation MRI brain is pending Maintain SBP 130-150 Currently on Cleviprex, titrate as tolerated Continue secondary stroke prevention  Hypertensive emergency leading to intraparenchymal hemorrhage Patient presented with with SBP in 170s with intraparenchymal hemorrhage Currently on Cleviprex infusion Home meds were resumed including Coreg and amlodipine Will restart hydralazine today Maintain SBP 130-150, try to titrate off Cleviprex  Acute delirium Continue delirium precautions Continue Seroquel twice daily  CKD stage IV Non-anion gap metabolic acidosis, improving Serum creatinine is at baseline Repeat labs are pending today Monitor intake and output Avoid nephrotoxic agents  Prediabetes Patient hemoglobin A1c is 5.7 Continue sliding scale insulin with CBG goal 140-180  Chronic HFpEF Monitor intake and output Avoid nephrotoxic agent Repeat echocardiogram showed EF 55 to 60% with grade 1 diastolic dysfunction Continue Lasix On Coreg, not on ACE inhibitor/ARB due to CKD Started on hydralazine, next med to add his Imdur  Anemia of chronic disease  Monitor H and H Transfuse if less than 7  Probable obstructive sleep apnea Patient is snoring loudly, he is lethargic all the time Likely has obstructive sleep apnea He will need to have sleep studies as an outpatient  Best Practice (right click and "Reselect all SmartList Selections" daily)  Diet/type: Regular consistency (see orders) DVT prophylaxis: SCD GI prophylaxis: N/A Lines: N/A Foley:  N/A Code Status:  full code Last date of multidisciplinary goals of care discussion [2/6:  Spoke with patient's, he would like to continue full scope of care]  Labs   CBC: Recent Labs  Lab 05/05/22 1833  WBC 11.5*  HGB 10.5*  HCT 34.9*  MCV 89.5  PLT 540    Basic Metabolic Panel: Recent Labs  Lab 05/05/22 1833 05/06/22 0327  NA 140 139  K 5.4* 4.2  CL 110 108  CO2 19* 21*  GLUCOSE 101* 133*  BUN 59* 56*  CREATININE 3.14* 3.22*  CALCIUM 7.6* 7.5*   GFR: Estimated Creatinine Clearance: 31.4 mL/min (A) (by C-G formula based on SCr of 3.22 mg/dL (H)). Recent Labs  Lab 05/05/22 1833  WBC 11.5*    Liver Function Tests: Recent Labs  Lab 05/05/22 1833  AST 31  ALT 43  ALKPHOS 173*  BILITOT 0.5  PROT 7.0  ALBUMIN 3.3*   No results for input(s): "LIPASE", "AMYLASE" in the last 168 hours. No results for input(s): "AMMONIA" in the last 168 hours.  ABG    Component Value Date/Time   HCO3 26.5 (H) 06/14/2007 1959   TCO2 18 (L) 01/06/2020 1737     Coagulation Profile: Recent Labs  Lab 05/05/22 2244  INR 1.6*    Cardiac Enzymes: No results for input(s): "CKTOTAL", "CKMB", "CKMBINDEX", "TROPONINI" in the last 168 hours.  HbA1C: Hemoglobin A1C  Date/Time Value Ref Range Status  06/27/2021 08:59 AM 5.9 (A) 4.0 - 5.6 % Final   HbA1c, POC (controlled diabetic range)  Date/Time Value Ref Range Status  11/06/2021 08:55 AM 5.8 0.0 - 7.0 % Final  01/02/2021 03:47 PM 6.3 0.0 - 7.0 % Final   Hgb A1c MFr Bld  Date/Time Value Ref Range Status  05/05/2022 10:44 PM 5.7 (H) 4.8 - 5.6 % Final    Comment:    (NOTE) Pre diabetes:          5.7%-6.4%  Diabetes:              >6.4%  Glycemic control for   <7.0% adults with diabetes     CBG: Recent Labs  Lab 05/06/22 1533 05/06/22 2002 05/06/22 2341 05/07/22 0350 05/07/22 0742  GLUCAP 81 113* 170* 86 87    This patient is critically ill with multiple organ system failure which requires frequent high complexity decision making, assessment, support, evaluation, and titration of therapies. This  was completed through the application of advanced monitoring technologies and extensive interpretation of multiple databases.  During this encounter critical care time was devoted to patient care services described in this note for 35 minutes.    Jacky Kindle, MD [facility] See Amion for pager If no response to pager, please call [phone] until 7pm After 7pm, Please call E-link [phone]

## 2022-05-07 NOTE — Evaluation (Signed)
Occupational Therapy Evaluation Patient Details Name: Craig West MRN: [id number] DOB: June 23, 1965 Today's Date: 05/07/2022   History of Present Illness Patient is a 56 y/o male who presents on 2/4 with chest pain and left sided weakness as well as facial droop. 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 CXR-pulmonary edema. PMH includes DM (lifestyle control) asthma, HTN, gout, hx tobacco use and current marijuana use.   Clinical Impression   Pt admitted with Right thalamic hemorrhage causing decreased LUE strength, ROM, coordination, sensation, and inattention requiring increased physical assistance to complete BADL and functional transfers . Pt currently with functional limitations due to the deficits listed below (see OT Problem List). Prior to admit, pt was living alone, not working, and independent with all ADL tasks and functional mobility. Pt will benefit from skilled OT to increase their safety and independence with ADL and functional mobility for ADL to facilitate discharge to venue listed below.        Recommendations for follow up therapy are one component of a multi-disciplinary discharge planning process, led by the attending physician.  Recommendations may be updated based on patient status, additional functional criteria and insurance authorization.   Follow Up Recommendations  Skilled nursing-short term rehab (<3 hours/day)     Assistance Recommended at Discharge Intermittent Supervision/Assistance  Patient can return home with the following Two people to help with walking and/or transfers;A lot of help with bathing/dressing/bathroom;Assistance with cooking/housework;Help with stairs or ramp for entrance;Assist for transportation    Functional Status Assessment  Patient has had a recent decline in their functional status and demonstrates the ability to make significant improvements in function in a reasonable and  predictable amount of time.  Equipment Recommendations  Other (comment) (defer to next venue of care)       Precautions / Restrictions Precautions Precautions: Fall;Other (comment) Precaution Comments: keep SBP 130-150, Left hemi, Left inattention, impulsive Restrictions Weight Bearing Restrictions: No      Mobility Bed Mobility Overal bed mobility: Needs Assistance Bed Mobility: Supine to Sit     Supine to sit: Min assist, HOB elevated       Patient Response: Flat affect, Cooperative  Transfers Overall transfer level: Needs assistance Equipment used: 2 person hand held assist, None Transfers: Sit to/from Stand, Bed to chair/wheelchair/BSC Sit to Stand: Mod assist, +2 physical assistance, From elevated surface     Step pivot transfers: Max assist, +2 physical assistance, From elevated surface            Balance Overall balance assessment: Needs assistance Sitting-balance support: Feet supported, No upper extremity supported Sitting balance-Leahy Scale: Fair Sitting balance - Comments: sitting EOB   Standing balance support: No upper extremity supported, During functional activity Standing balance-Leahy Scale: Poor Standing balance comment: standing at EOB. Reliant on therapist's for balance due to decreased LLE sensation and knee buckling. [place] removed during session as SpO2 was at 97% on Room Air. At end of session SpO2 at 93% and [place] was replaced and oxygen decreased to 1L.           ADL either performed or assessed with clinical judgement   ADL Overall ADL's : Needs assistance/impaired Eating/Feeding: Set up;Bed level   Grooming: Wash/dry face;Wash/dry hands;Sitting;Minimal assistance   Upper Body Bathing: Minimal assistance;Sitting   Lower Body Bathing: Total assistance;Sit to/from stand   Upper Body Dressing : Sitting;Moderate assistance   Lower Body Dressing: Total assistance;Bed level   Toilet Transfer: Moderate assistance;+2 for physical  assistance;+2  for safety/equipment;Stand-pivot Toilet Transfer Details (indicate cue type and reason): simulated Toileting- Clothing Manipulation and Hygiene: Total assistance;Sit to/from stand               Vision Baseline Vision/History: 0 No visual deficits Ability to See in Adequate Light: 0 Adequate Patient Visual Report: No change from baseline Vision Assessment?: No apparent visual deficits        Praxis Praxis Praxis-Other Comments: motor coordination delayed and insufficiant during functional task.    Pertinent Vitals/Pain Pain Assessment Pain Assessment: No/denies pain     Hand Dominance Right   Extremity/Trunk Assessment Upper Extremity Assessment Upper Extremity Assessment: RUE deficits/detail;LUE deficits/detail RUE Deficits / Details: A/ROM WNL in all ranges. Full 5/5 strength. LUE Deficits / Details: A/ROM is WFL in all ranges (shoulder, elbow, wrist, hand). Decreased motor coordination. Decreased gross grasp. Moderate resistive tone noted although able to move through it volitionally. LUE Sensation: decreased proprioception;decreased light touch LUE Coordination: decreased gross motor;decreased fine motor   Lower Extremity Assessment Lower Extremity Assessment: Defer to PT evaluation LLE Deficits / Details: limited DF AROM, PROM WFLs, grossly 4/5 knee ext, 2+/5 hip flexion LLE Sensation: decreased light touch;decreased proprioception LLE Coordination: decreased gross motor;decreased fine motor   Cervical / Trunk Assessment Cervical / Trunk Assessment: Normal   Communication Communication Communication: No difficulties   Cognition Arousal/Alertness: Awake/alert, Lethargic Behavior During Therapy: Impulsive Overall Cognitive Status: Impaired/Different from baseline Area of Impairment: Safety/judgement, Awareness, Problem solving     Safety/Judgement: Decreased awareness of safety, Decreased awareness of deficits   Problem Solving: Requires verbal  cues General Comments: needing stimulus initially or pt would fall asleep but this improved once sitting upright EOB. Impulsive.     General Comments  BP monitored during session and remained stable. Supine: 133/90. Seated in chair: 133/96            Home Living Family/patient expects to be discharged to:: Private residence Living Arrangements: Alone Available Help at Discharge:  (Pt reports there is no available help at discharge) Type of Home: House Home Access: Stairs to enter [employer] of Steps: 2 Entrance Stairs-Rails: Right Home Layout: Two level;Bed/bath upstairs Alternate Level Stairs-Number of Steps: 1 flight   Bathroom Shower/Tub: Teacher, early years/pre: Standard     Home Equipment: None          Prior Functioning/Environment Prior Level of Function : Independent/Modified Independent   Mobility Comments: Does not work, independent, drives ADLs Comments: independent        OT Problem List: Decreased strength;Decreased range of motion;Decreased coordination;Impaired sensation;Decreased cognition;Decreased activity tolerance;Decreased safety awareness;Impaired tone;Impaired balance (sitting and/or standing);Impaired UE functional use;Decreased knowledge of precautions      OT Treatment/Interventions: Self-care/ADL training;Splinting;Therapeutic activities;Therapeutic exercise;Neuromuscular education;Cognitive remediation/compensation;Energy conservation;Patient/family education;DME and/or AE instruction;Manual therapy;Balance training;Modalities    OT Goals(Current goals can be found in the care plan section) Acute Rehab OT Goals Patient Stated Goal: none stated OT Goal Formulation: With patient Time For Goal Achievement: 05/21/22 Potential to Achieve Goals: Good  OT Frequency: Min 2X/week    Co-evaluation PT/OT/SLP Co-Evaluation/Treatment: Yes Reason for Co-Treatment: To address functional/ADL transfers PT goals addressed during  session: Mobility/safety with mobility;Balance OT goals addressed during session: ADL's and self-care;Strengthening/ROM      AM-PAC OT "6 Clicks" Daily Activity     Outcome Measure Help from another person eating meals?: None Help from another person taking care of personal grooming?: A Little Help from another person toileting, which includes using toliet, bedpan, or urinal?: A Lot  Help from another person bathing (including washing, rinsing, drying)?: A Lot Help from another person to put on and taking off regular upper body clothing?: A Little Help from another person to put on and taking off regular lower body clothing?: Total 6 Click Score: 15   End of Session Equipment Utilized During Treatment: Gait belt Nurse Communication: Mobility status  Activity Tolerance: Patient tolerated treatment well Patient left: in chair;with call bell/phone within reach;with chair alarm set  OT Visit Diagnosis: Muscle weakness (generalized) (M62.81);Hemiplegia and hemiparesis Hemiplegia - Right/Left: Left Hemiplegia - dominant/non-dominant: Non-Dominant                Time: 3779-3968 OT Time Calculation (min): 28 min Charges:  OT General Charges $OT Visit: 1 Visit OT Evaluation $OT Eval High Complexity: 1 High  [employer], OTR/L,CBIS  Supplemental OT - [facility] Secure Chat Preferred    Rontae Inglett, Clarene Duke 05/07/2022, 11:01 AM

## 2022-05-07 NOTE — Evaluation (Signed)
Physical Therapy Evaluation Patient Details Name: Craig West MRN: [id number] DOB: 1965/12/10 Today's Date: 05/07/2022  History of Present Illness  Patient is a 57 y/o male who presents on 2/4 with chest pain and left sided weakness as well as facial droop. 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 CXR-pulmonary edema. PMH includes DM (lifestyle control) asthma, HTN, gout, hx tobacco use and current marijuana use.  Clinical Impression  Patient presents with left sided hemiparesis, left inattention, impulsivity, impaired sensation, impaired balance and impaired mobility s/p above. Pt is independent and lives alone PTA. Reports he has no support at d/c. Today, pt requires Min A for bed mobility, Mod A of 2 for standing and Max A of 2 for step pivot transfer to chair requiring assist for LLE management due to buckling, balance and safety. Pt impulsive with all mobility. Would benefit from SNF to maximize independence and mobility prior to return home. Will follow acutely.       Recommendations for follow up therapy are one component of a multi-disciplinary discharge planning process, led by the attending physician.  Recommendations may be updated based on patient status, additional functional criteria and insurance authorization.  Follow Up Recommendations Skilled nursing-short term rehab (<3 hours/day) Can patient physically be transported by private vehicle: No    Assistance Recommended at Discharge Frequent or constant Supervision/Assistance  Patient can return home with the following  Two people to help with walking and/or transfers;Assistance with cooking/housework;Assist for transportation;Help with stairs or ramp for entrance    Equipment Recommendations Other (comment) (defer to next venue)  Recommendations for Other Services       Functional Status Assessment Patient has had a recent decline in their functional status and  demonstrates the ability to make significant improvements in function in a reasonable and predictable amount of time.     Precautions / Restrictions Precautions Precautions: Fall;Other (comment) Precaution Comments: keep SBP 130-150, Left hemi, Left inattention, impulsive Restrictions Weight Bearing Restrictions: No      Mobility  Bed Mobility Overal bed mobility: Needs Assistance Bed Mobility: Supine to Sit     Supine to sit: Min assist, HOB elevated     General bed mobility comments: Assist with LLE to get to EOB, cues for technique.    Transfers Overall transfer level: Needs assistance Equipment used: 2 person hand held assist, None Transfers: Sit to/from Stand, Bed to chair/wheelchair/BSC Sit to Stand: Mod assist, +2 physical assistance, From elevated surface   Step pivot transfers: Max assist, +2 physical assistance, From elevated surface       General transfer comment: Stood from EOB x2 with cues for hand placement/technique, LLE initially flexed and buckling, able to extend and lock out into extension with standing, left lateral and posterior lean noted with increased standing time. Step pivot transfer towards right with Max A of 2 for balance, LLE management and safety.    Ambulation/Gait               General Gait Details: Unable  Stairs            Wheelchair Mobility    Modified Rankin (Stroke Patients Only) Modified Rankin (Stroke Patients Only) Pre-Morbid Rankin Score: No symptoms Modified Rankin: Severe disability     Balance Overall balance assessment: Needs assistance Sitting-balance support: Feet supported, No upper extremity supported Sitting balance-Leahy Scale: Fair Sitting balance - Comments: sitting EOB supervision, posterior lean with MMT of LEs but able to regain balance Postural control:  Posterior lean Standing balance support: During functional activity, Single extremity supported Standing balance-Leahy Scale:  Poor Standing balance comment: standing at EOB. Reliant on therapist's for balance due to decreased LLE sensation and knee buckling. Worsened left lateral lean with prolonged standing.                             Pertinent Vitals/Pain Pain Assessment Pain Assessment: No/denies pain    Home Living Family/patient expects to be discharged to:: Private residence Living Arrangements: Alone Available Help at Discharge:  (no help at d.c) Type of Home: House Home Access: Stairs to enter Entrance Stairs-Rails: Right Entrance Stairs-Number of Steps: 2 Alternate Level Stairs-Number of Steps: 1 flight Home Layout: Two level;Bed/bath upstairs Home Equipment: None      Prior Function Prior Level of Function : Independent/Modified Independent             Mobility Comments: Does not work, independent, drives ADLs Comments: independent     Hand Dominance   Dominant Hand: Right    Extremity/Trunk Assessment   Upper Extremity Assessment Upper Extremity Assessment: Defer to OT evaluation RUE Deficits / Details: A/ROM WNL in all ranges. Full 5/5 strength. LUE Deficits / Details: A/ROM is WFL in all ranges (shoulder, elbow, wrist, hand). Decreased motor coordination. Decreased gross grasp. Moderate resistive tone noted although able to move through it volitionally. LUE Sensation: decreased proprioception;decreased light touch LUE Coordination: decreased gross motor;decreased fine motor    Lower Extremity Assessment Lower Extremity Assessment: LLE deficits/detail LLE Deficits / Details: limited DF AROM, PROM WFLs, grossly 4/5 knee ext, 2+/5 hip flexion LLE Sensation: decreased light touch;decreased proprioception LLE Coordination: decreased gross motor;decreased fine motor    Cervical / Trunk Assessment Cervical / Trunk Assessment: Normal  Communication   Communication: No difficulties  Cognition Arousal/Alertness: Awake/alert, Lethargic Behavior During Therapy:  Impulsive Overall Cognitive Status: Impaired/Different from baseline Area of Impairment: Safety/judgement, Awareness, Problem solving                         Safety/Judgement: Decreased awareness of safety, Decreased awareness of deficits Awareness: Emergent Problem Solving: Requires verbal cues General Comments: needing stimulus initially or pt would fall asleep but this improved once sitting upright EOB. Impulsive.        General Comments General comments (skin integrity, edema, etc.): BP stable throughout 137/96 pre activity and 118/96 post standing, 133/96 in chair post activity.    Exercises     Assessment/Plan    PT Assessment Patient needs continued PT services  PT Problem List Decreased range of motion;Decreased strength;Decreased mobility;Decreased coordination;Decreased safety awareness;Impaired tone;Decreased cognition;Decreased balance;Impaired sensation       PT Treatment Interventions Therapeutic activities;Gait training;Therapeutic exercise;Patient/family education;Wheelchair mobility training;Balance training;Functional mobility training;Neuromuscular re-education;Cognitive remediation;DME instruction    PT Goals (Current goals can be found in the Care Plan section)  Acute Rehab PT Goals Patient Stated Goal: to return to independence PT Goal Formulation: With patient Time For Goal Achievement: 05/21/22 Potential to Achieve Goals: Fair    Frequency Min 3X/week     Co-evaluation PT/OT/SLP Co-Evaluation/Treatment: Yes Reason for Co-Treatment: To address functional/ADL transfers PT goals addressed during session: Mobility/safety with mobility;Balance OT goals addressed during session: ADL's and self-care;Strengthening/ROM       AM-PAC PT "6 Clicks" Mobility  Outcome Measure Help needed turning from your back to your side while in a flat bed without using bedrails?: A Little Help needed moving from lying  on your back to sitting on the side of a  flat bed without using bedrails?: A Lot Help needed moving to and from a bed to a chair (including a wheelchair)?: Total Help needed standing up from a chair using your arms (e.g., wheelchair or bedside chair)?: A Lot Help needed to walk in hospital room?: Total Help needed climbing 3-5 steps with a railing? : Total 6 Click Score: 10    End of Session Equipment Utilized During Treatment: Gait belt Activity Tolerance: Patient tolerated treatment well Patient left: in chair;with call bell/phone within reach;with chair alarm set Nurse Communication: Mobility status PT Visit Diagnosis: Hemiplegia and hemiparesis;Difficulty in walking, not elsewhere classified (R26.2);Unsteadiness on feet (R26.81) Hemiplegia - Right/Left: Left Hemiplegia - dominant/non-dominant: Non-dominant Hemiplegia - caused by: Cerebral infarction    Time: 1308-6578 PT Time Calculation (min) (ACUTE ONLY): 28 min   Charges:   PT Evaluation $PT Eval Moderate Complexity: 1 Mod          Marisa Severin, PT, DPT Acute Rehabilitation Services Secure chat preferred Office [place] 05/07/2022, 11:24 AM

## 2022-05-07 NOTE — Progress Notes (Addendum)
STROKE TEAM PROGRESS NOTE   INTERVAL HISTORY No family at the bedside. Cleviprex infusing and PO BP increased, hydralazie added.  Goal is to titrate off today and transfer to floor bed.  Somnolent but wakes up easily. Difficulty with maintaining wakefulness.  He did get Seroquel this am.    MRI today.   Vitals:   05/07/22 0930 05/07/22 0945 05/07/22 1000 05/07/22 1015  BP: (!) 118/96 135/85 137/86 (!) 131/107  Pulse: 65 60 (!) 55 66  Resp: (!) '21 18 17 18  '$ Temp:      TempSrc:      SpO2: 95% 97% 98% 94%  Weight:      Height:       CBC:  Recent Labs  Lab 05/05/22 1833 05/07/22 0908  WBC 11.5* 10.3  NEUTROABS  --  8.6*  HGB 10.5* 10.5*  HCT 34.9* 34.7*  MCV 89.5 87.4  PLT 256 366    Basic Metabolic Panel:  Recent Labs  Lab 05/05/22 1833 05/06/22 0327  NA 140 139  K 5.4* 4.2  CL 110 108  CO2 19* 21*  GLUCOSE 101* 133*  BUN 59* 56*  CREATININE 3.14* 3.22*  CALCIUM 7.6* 7.5*    Lipid Panel:  Recent Labs  Lab 05/05/22 2244  CHOL 95  TRIG 131  HDL 35*  CHOLHDL 2.7  VLDL 26  LDLCALC 34    HgbA1c:  Recent Labs  Lab 05/05/22 2244  HGBA1C 5.7*    Urine Drug Screen: No results for input(s): "LABOPIA", "COCAINSCRNUR", "LABBENZ", "AMPHETMU", "THCU", "LABBARB" in the last 168 hours.  Alcohol Level No results for input(s): "ETH" in the last 168 hours.  IMAGING past 24 hours CT HEAD WO CONTRAST (5MM)  Result Date: 05/06/2022 CLINICAL DATA:  Follow-up examination for stroke. EXAM: CT HEAD WITHOUT CONTRAST TECHNIQUE: Contiguous axial images were obtained from the base of the skull through the vertex without intravenous contrast. RADIATION DOSE REDUCTION: This exam was performed according to the departmental dose-optimization program which includes automated exposure control, adjustment of the mA and/or kV according to patient size and/or use of iterative reconstruction technique. COMPARISON:  Prior CT from earlier the same day. FINDINGS: Brain: Again the previously  identified intraparenchymal hematoma centered at the right thalamus again seen, not significantly changed in size and morphology as compared to previous. Mild localized edema without significant regional mass effect. No intraventricular extension or other complicating features. No significant midline shift. Basilar cisterns remain patent. No new intracranial hemorrhage or large vessel territory infarct. No mass lesion. No hydrocephalus or extra-axial fluid collection. Vascular: No abnormal hyperdense vessel. Skull: Scalp soft tissues and calvarium demonstrate no new finding. Sinuses/Orbits: Globes and orbital soft tissues within normal limits. Paranasal sinuses and mastoid air cells remain largely clear. Other: None. IMPRESSION: 1. No significant interval change in size and morphology of intraparenchymal hematoma centered at the right thalamus. Mild localized edema without significant regional mass effect. 2. No other new acute intracranial abnormality. Electronically Signed   By: Jeannine Boga M.D.   On: 05/06/2022 20:30   ECHOCARDIOGRAM COMPLETE  Result Date: 05/06/2022    ECHOCARDIOGRAM REPORT   Patient Name:   Craig West Date of Exam: 05/06/2022 Medical Rec #:  [id number]    Height:       73.0 in Accession #:    [id number]   Weight:       225.0 lb Date of Birth:  10-Dec-1965    BSA:          2.262 m Patient  Age:    57 years     BP:           143/91 mmHg Patient Gender: M            HR:           95 bpm. Exam Location:  Inpatient Procedure: 2D Echo, Cardiac Doppler and Color Doppler Indications:    I63.9 Stroke  History:        Patient has prior history of Echocardiogram examinations, most                 recent 12/24/2020. CKD and Stroke; Risk Factors:Hypertension,                 Diabetes, Dyslipidemia and Former Smoker.  Sonographer:    Wilkie Aye RVT RCS Referring Phys: [id number] [place]  Sonographer Comments: Technically challenging due to patient unable to stay awake; sleep apenea and  respiratory motion. Some images may be off axis. IMPRESSIONS  1. Left ventricular ejection fraction, by estimation, is 55 to 60%. The left ventricle has normal function. The left ventricle has no regional wall motion abnormalities. There is severe concentric left ventricular hypertrophy. Left ventricular diastolic  parameters are consistent with Grade II diastolic dysfunction (pseudonormalization).  2. Right ventricular systolic function is normal. The right ventricular size is normal. Tricuspid regurgitation signal is inadequate for assessing PA pressure.  3. Right atrial size was mildly dilated.  4. The mitral valve is normal in structure. Trivial mitral valve regurgitation. No evidence of mitral stenosis.  5. The aortic valve is tricuspid. There is moderate calcification of the aortic valve. Aortic valve regurgitation is not visualized. No aortic stenosis is present.  6. The inferior vena cava is dilated in size with >50% respiratory variability, suggesting right atrial pressure of 8 mmHg.  7. Degree of LVH is concerning for cardiac amyloidosis versus long-standing severe hypertension.  8. Left pleural effusion noted. Comparison(s): EF 60-65%. FINDINGS  Left Ventricle: Left ventricular ejection fraction, by estimation, is 55 to 60%. The left ventricle has normal function. The left ventricle has no regional wall motion abnormalities. The left ventricular internal cavity size was normal in size. There is  severe concentric left ventricular hypertrophy. Left ventricular diastolic parameters are consistent with Grade II diastolic dysfunction (pseudonormalization). Right Ventricle: The right ventricular size is normal. No increase in right ventricular wall thickness. Right ventricular systolic function is normal. Tricuspid regurgitation signal is inadequate for assessing PA pressure. Left Atrium: Left atrial size was normal in size. Right Atrium: Right atrial size was mildly dilated. Pericardium: Left pleural effusion  noted. Trivial pericardial effusion is present. Mitral Valve: The mitral valve is normal in structure. Trivial mitral valve regurgitation. No evidence of mitral valve stenosis. Tricuspid Valve: The tricuspid valve is normal in structure. Tricuspid valve regurgitation is not demonstrated. Aortic Valve: The aortic valve is tricuspid. There is moderate calcification of the aortic valve. Aortic valve regurgitation is not visualized. No aortic stenosis is present. Aortic valve mean gradient measures 6.0 mmHg. Aortic valve peak gradient measures 11.4 mmHg. Aortic valve area, by VTI measures 2.38 cm. Pulmonic Valve: The pulmonic valve was normal in structure. Pulmonic valve regurgitation is trivial. Aorta: The aortic root is normal in size and structure. Venous: The inferior vena cava is dilated in size with greater than 50% respiratory variability, suggesting right atrial pressure of 8 mmHg. IAS/Shunts: No atrial level shunt detected by color flow Doppler.  LEFT VENTRICLE PLAX 2D LVIDd:  4.60 cm   Diastology LVIDs:         2.80 cm   LV e' medial:    4.72 cm/s LV PW:         1.80 cm   LV E/e' medial:  22.9 LV IVS:        2.30 cm   LV e' lateral:   5.40 cm/s LVOT diam:     2.20 cm   LV E/e' lateral: 20.0 LV SV:         70 LV SV Index:   31 LVOT Area:     3.80 cm  RIGHT VENTRICLE             IVC RV Basal diam:  3.80 cm     IVC diam: 2.30 cm RV Mid diam:    2.40 cm RV S prime:     13.30 cm/s TAPSE (M-mode): 1.8 cm LEFT ATRIUM           Index        RIGHT ATRIUM           Index LA diam:      4.70 cm 2.08 cm/m   RA Area:     20.20 cm LA Vol (A2C): 70.4 ml 31.12 ml/m  RA Volume:   58.50 ml  25.86 ml/m LA Vol (A4C): 49.3 ml 21.79 ml/m  AORTIC VALVE                     PULMONIC VALVE AV Area (Vmax):    2.50 cm      PV Vmax:       0.81 m/s AV Area (Vmean):   2.21 cm      PV Peak grad:  2.6 mmHg AV Area (VTI):     2.38 cm AV Vmax:           169.00 cm/s AV Vmean:          116.000 cm/s AV VTI:            0.296 m AV  Peak Grad:      11.4 mmHg AV Mean Grad:      6.0 mmHg LVOT Vmax:         111.00 cm/s LVOT Vmean:        67.300 cm/s LVOT VTI:          0.185 m LVOT/AV VTI ratio: 0.62  AORTA Ao Root diam: 3.20 cm Ao Asc diam:  3.50 cm MITRAL VALVE MV Area (PHT): 4.49 cm     SHUNTS MV Decel Time: 169 msec     Systemic VTI:  0.18 m MV E velocity: 108.00 cm/s  Systemic Diam: 2.20 cm MV A velocity: 40.70 cm/s MV E/A ratio:  2.65 Dalton McleanMD Electronically signed by Franki Monte Signature Date/Time: 05/06/2022/12:52:35 PM    Final     PHYSICAL EXAM Constitutional: Appears well-developed and well-nourished.  Cardiovascular: Normal rate and regular rhythm. Perfusing extremities well Respiratory: Audibly wheezing and very quickly short of breath on strength testing   Neuro: Mental Status: Patient is drowsy but wakes up enough to do exam, oriented to person, place, month, year. No signs of aphasia or neglect Cranial Nerves: II:  Pupils are equal, round, and reactive to light.   III,IV, VI: EOMI without ptosis or diploplia.  V: Facial sensation is symmetric to temperature VII: left facial droop  VIII: hearing is intact to voice X: Uvula elevates symmetrically XI: Shoulder shrug is symmetric. XII: tongue is midline without atrophy  or fasciculations.  Motor: Tone is normal. Bulk is normal. 5/5 strength was present in on the right side.  On the left  3/5 in the upper extremity, 4/5 lower extremity Sensory: Left side no sensation  Deep Tendon Reflexes: Hypoactive throughout Plantars: Toes are mute on the left, downgoing on the right Cerebellar: FNF and HKS are intact within limits of weakness Gait:  Deferred for safety  ASSESSMENT/PLAN [name] is a 57 y.o. male with history of hypertension, diabetes (reports diet and exercise controlled), hyperlipidemia, asthma, remote smoking (currently smoking marijuana once or twice daily), gout presenting with left sided numbness and weakness.   Stroke:   Right thalamic hemorrhage Etiology:  likely due to uncontrolled HTN vs stroke with hemorrhagic conversion Code Stroke CT head Acute hypertensive type hemorrhage in the right thalamus measuring 2.6 mL. Repeat CT Head- Unchanged size of intraparenchymal hematoma in the right thalamus. No new hemorrhage. MRI  Pending MRA Head pending  2D Echo EF is 27-03%, grade 2 diastolic dysfunction, right atrium mildly dilated LDL 34 HgbA1c 5.7 VTE prophylaxis -SCDs    Diet   Diet Heart Room service appropriate? Yes; Fluid consistency: Thin   No antithrombotic prior to admission, now on No antithrombotic.  Therapy recommendations:  Pending Disposition:  Pending  Hypertension Congestive Heart failure Home meds:  Amlodipine, coreg, hydralazine, lasix Unstable BP goal systolic less than 500 Long-term BP goal normotensive  Hyperlipidemia Home meds:  Atorvastatin '80mg'$ , resumed in hospital LDL 34, goal < 70 Continue statin at discharge  Diabetes type II Controlled Home meds:  None HgbA1c 5.7, goal < 7.0 CBGs Recent Labs    05/06/22 2341 05/07/22 0350 05/07/22 0742  GLUCAP 170* 86 87     SSI  Other Stroke Risk Factors Marijuana use Congestive heart failure  Other Active Problems Gout Allopurinol, colchicine Asthma 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 3.22, BUN 56, GFR 21 Agitation Seroquel '50mg'$  BID started 2/4  Hospital day # 2   ATTENDING ATTESTATION:  57 year old with intracranial hemorrhage right thalamus with some edema. Repeat CT yesterday stable. MRI today. Valium added 2.'5mg'$  prn IV for sedation if needed. Discussed with ICU nurse. He is somnolent. He is on seroquel, agitation improved. Goal sBP<160. Hydralazine added today. Transfer out of ICU when clevi titrated off.     This patient is critically ill due to intracranial hemorrhage and at significant risk of neurological worsening, death form heart failure, respiratory failure, recurrent stroke, bleeding from [facility], seizure,  sepsis. This patient's care requires constant monitoring of vital signs, hemodynamics, respiratory and cardiac monitoring, review of multiple databases, neurological assessment, discussion with family, other specialists and medical decision making of high complexity. I spent 35 minutes of neurocritical care time in the care of this patient.   Leota Maka,MD   To contact Stroke Continuity provider, please refer to [email or web address] After hours, contact General Neurology

## 2022-05-08 DIAGNOSIS — I619 Nontraumatic intracerebral hemorrhage, unspecified: Secondary | ICD-10-CM | POA: Diagnosis not present

## 2022-05-08 DIAGNOSIS — I6389 Other cerebral infarction: Secondary | ICD-10-CM | POA: Diagnosis not present

## 2022-05-08 LAB — CBC WITH DIFFERENTIAL/PLATELET
Abs Immature Granulocytes: 0.08 10*3/uL — ABNORMAL HIGH (ref 0.00–0.07)
Basophils Absolute: 0.1 10*3/uL (ref 0.0–0.1)
Basophils Relative: 0 %
Eosinophils Absolute: 0.4 10*3/uL (ref 0.0–0.5)
Eosinophils Relative: 3 %
HCT: 33.5 % — ABNORMAL LOW (ref 39.0–52.0)
Hemoglobin: 10.5 g/dL — ABNORMAL LOW (ref 13.0–17.0)
Immature Granulocytes: 1 %
Lymphocytes Relative: 7 %
Lymphs Abs: 0.8 10*3/uL (ref 0.7–4.0)
MCH: 26.8 pg (ref 26.0–34.0)
MCHC: 31.3 g/dL (ref 30.0–36.0)
MCV: 85.5 fL (ref 80.0–100.0)
Monocytes Absolute: 1.3 10*3/uL — ABNORMAL HIGH (ref 0.1–1.0)
Monocytes Relative: 10 %
Neutro Abs: 9.6 10*3/uL — ABNORMAL HIGH (ref 1.7–7.7)
Neutrophils Relative %: 79 %
Platelets: 241 10*3/uL (ref 150–400)
RBC: 3.92 MIL/uL — ABNORMAL LOW (ref 4.22–5.81)
RDW: 18.4 % — ABNORMAL HIGH (ref 11.5–15.5)
WBC: 12.2 10*3/uL — ABNORMAL HIGH (ref 4.0–10.5)
nRBC: 0 % (ref 0.0–0.2)

## 2022-05-08 LAB — BASIC METABOLIC PANEL
Anion gap: 12 (ref 5–15)
BUN: 49 mg/dL — ABNORMAL HIGH (ref 6–20)
CO2: 21 mmol/L — ABNORMAL LOW (ref 22–32)
Calcium: 7.6 mg/dL — ABNORMAL LOW (ref 8.9–10.3)
Chloride: 106 mmol/L (ref 98–111)
Creatinine, Ser: 3.26 mg/dL — ABNORMAL HIGH (ref 0.61–1.24)
GFR, Estimated: 21 mL/min — ABNORMAL LOW (ref >60)
Glucose, Bld: 116 mg/dL — ABNORMAL HIGH (ref 70–99)
Potassium: 3.9 mmol/L (ref 3.5–5.1)
Sodium: 139 mmol/L (ref 135–145)

## 2022-05-08 LAB — GLUCOSE, CAPILLARY
Glucose-Capillary: 109 mg/dL — ABNORMAL HIGH (ref 70–99)
Glucose-Capillary: 120 mg/dL — ABNORMAL HIGH (ref 70–99)
Glucose-Capillary: 146 mg/dL — ABNORMAL HIGH (ref 70–99)
Glucose-Capillary: 150 mg/dL — ABNORMAL HIGH (ref 70–99)
Glucose-Capillary: 172 mg/dL — ABNORMAL HIGH (ref 70–99)
Glucose-Capillary: 188 mg/dL — ABNORMAL HIGH (ref 70–99)

## 2022-05-08 MED ORDER — ALLOPURINOL 100 MG PO TABS
100.0000 mg | ORAL_TABLET | Freq: Every day | ORAL | Status: DC
  Administered 2022-05-08 – 2022-05-14 (×7): 100 mg via ORAL
  Filled 2022-05-08 (×7): qty 1

## 2022-05-08 MED ORDER — QUETIAPINE FUMARATE 25 MG PO TABS
25.0000 mg | ORAL_TABLET | Freq: Every day | ORAL | Status: DC
  Administered 2022-05-09 – 2022-05-13 (×5): 25 mg via ORAL
  Filled 2022-05-08 (×6): qty 1

## 2022-05-08 MED ORDER — HYDRALAZINE HCL 20 MG/ML IJ SOLN
10.0000 mg | Freq: Once | INTRAMUSCULAR | Status: AC
  Administered 2022-05-08: 10 mg via INTRAVENOUS
  Filled 2022-05-08: qty 1

## 2022-05-08 MED ORDER — HYDRALAZINE HCL 50 MG PO TABS
100.0000 mg | ORAL_TABLET | Freq: Three times a day (TID) | ORAL | Status: DC
  Administered 2022-05-08 – 2022-05-14 (×18): 100 mg via ORAL
  Filled 2022-05-08 (×18): qty 2

## 2022-05-08 NOTE — Progress Notes (Addendum)
Physical Therapy Treatment Patient Details Name: Craig West MRN: [id number] DOB: September 29, 1965 Today's Date: 05/08/2022   History of Present Illness Patient is a 57 y/o male who presents on 2/4 with chest pain and left sided weakness as well as facial droop. 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 CXR-pulmonary edema. PMH includes DM (lifestyle control) asthma, HTN, gout, hx tobacco use and current marijuana use.    PT Comments    Pt progressing towards his physical therapy goals and remains motivated to participate. Session focused on transfer training utilizing Stedy to promote increased weightbearing to BLE's and strengthening. Pt with multiple episodes of bowel incontinence, so performed several transfers on and off BSC with extended time in standing position for peri care. Tendency for left lateral lean, but able to correct with cueing. At high risk for further L shoulder subluxation due to impulsivity and decreased safety awareness. Ordered L shoulder sling for transfers/ambulation. Does display some LUE activation. Continue to recommend SNF for ongoing Physical Therapy.      Recommendations for follow up therapy are one component of a multi-disciplinary discharge planning process, led by the attending physician.  Recommendations may be updated based on patient status, additional functional criteria and insurance authorization.  Follow Up Recommendations  Skilled nursing-short term rehab (<3 hours/day) Can patient physically be transported by private vehicle: No   Assistance Recommended at Discharge Frequent or constant Supervision/Assistance  Patient can return home with the following Two people to help with walking and/or transfers;Assistance with cooking/housework;Assist for transportation;Help with stairs or ramp for entrance   Equipment Recommendations  Other (comment) (defer)    Recommendations for Other Services        Precautions / Restrictions Precautions Precautions: Fall;Other (comment) Precaution Comments: Left hemi, Left inattention, impulsive Restrictions Weight Bearing Restrictions: No     Mobility  Bed Mobility Overal bed mobility: Needs Assistance Bed Mobility: Supine to Sit, Sit to Supine     Supine to sit: Min guard Sit to supine: Min assist   General bed mobility comments: requires assist/reminders to guard LUE, minA for LLE back into bed    Transfers Overall transfer level: Needs assistance Equipment used: Ambulation equipment used Transfers: Sit to/from Stand Sit to Stand: Mod assist, +2 physical assistance           General transfer comment: ModA + 2 with use of Stedy, pulling up with RUE. Cues for upright posture, glute activation, midline positioning. stood multiple times and transferred on/off [place] Transfer via Lift Equipment: Stedy  Ambulation/Gait                   Stairs             Wheelchair Mobility    Modified Rankin (Stroke Patients Only) Modified Rankin (Stroke Patients Only) Pre-Morbid Rankin Score: No symptoms Modified Rankin: Severe disability     Balance Overall balance assessment: Needs assistance Sitting-balance support: Feet supported Sitting balance-Leahy Scale: Fair                                      Cognition Arousal/Alertness: Awake/alert Behavior During Therapy: Impulsive Overall Cognitive Status: Impaired/Different from baseline Area of Impairment: Safety/judgement, Awareness, Problem solving                         Safety/Judgement: Decreased awareness of safety, Decreased awareness of  deficits Awareness: Emergent Problem Solving: Requires verbal cues          Exercises      General Comments        Pertinent Vitals/Pain Pain Assessment Pain Assessment: Faces Faces Pain Scale: No hurt    Home Living                          Prior Function            PT  Goals (current goals can now be found in the care plan section) Acute Rehab PT Goals Patient Stated Goal: to return to independence Potential to Achieve Goals: Fair Progress towards PT goals: Progressing toward goals    Frequency    Min 3X/week      PT Plan Current plan remains appropriate    Co-evaluation              AM-PAC PT "6 Clicks" Mobility   Outcome Measure  Help needed turning from your back to your side while in a flat bed without using bedrails?: A Little Help needed moving from lying on your back to sitting on the side of a flat bed without using bedrails?: A Little Help needed moving to and from a bed to a chair (including a wheelchair)?: Total Help needed standing up from a chair using your arms (e.g., wheelchair or bedside chair)?: Total Help needed to walk in hospital room?: Total Help needed climbing 3-5 steps with a railing? : Total 6 Click Score: 10    End of Session Equipment Utilized During Treatment: Gait belt Activity Tolerance: Patient tolerated treatment well Patient left: in bed;with call bell/phone within reach;with bed alarm set Nurse Communication: Mobility status PT Visit Diagnosis: Hemiplegia and hemiparesis;Difficulty in walking, not elsewhere classified (R26.2);Unsteadiness on feet (R26.81) Hemiplegia - Right/Left: Left Hemiplegia - dominant/non-dominant: Non-dominant Hemiplegia - caused by: Cerebral infarction     Time: 9191-6606 PT Time Calculation (min) (ACUTE ONLY): 45 min  Charges:  $Therapeutic Activity: 38-52 mins                     carotid bruit t    Carloine T Brown 05/08/2022, 1:19 PM

## 2022-05-08 NOTE — Progress Notes (Signed)
PROGRESS NOTE    Craig West  YQI:[id number] DOB: [birth year] DOA: 05/05/2022 PCP: Charlott Rakes, MD   Brief Narrative:  57 yo PMH DM (lifestyle control) asthma,  HTN HLD gout hx tobacco use and current mj use, presented to ED 2/4 with L sided numbness and weakness over the prior 48h - progressed to a point he couldn't take his home medications. Hypertensive in ED, started on cleviprex gtt - taken to neuro ICU  given hemorrhagic stroke on CT at intake. Thought to be ischemic with conversion. Transferred to hospitalist group 05/08/22  Assessment & Plan:   Principal Problem:   Stroke [facility]) Active Problems:   Hemorrhagic stroke [facility])   Hypertensive emergency   Nontraumatic intracerebral hemorrhage ([place])  Acute right thalamic intraparenchymal hemorrhage Probable right thalamic acute ischemic stroke with hemorrhagic conversion Cerebral edema Repeat MRI/MRA shows right thalamic hematoma stable compared 05/05/22 Continue aggressive blood pressure control Cleviprex weaned off Continue to hold anticoagulation or antiplatelets   Hypertensive emergency leading to intraparenchymal hemorrhage Likely the cause of above, Cleviprex discontinued Continue Coreg and amlodipine, increase hydralazine - all these are at max dose - if necessary will add 4th antihypertensive (imdur)   Acute delirium, resolving Continue delirium precautions Continue Seroquel twice daily   CKD stage IV Non-anion gap metabolic acidosis, improving Serum creatinine is at baseline, around 3.5 Follow UOP   Prediabetes, well controlled Patient hemoglobin A1c is 5.7 Continue sliding scale insulin with CBG goal 140-180   Chronic HFpEF Without acute exacerbation Repeat echocardiogram showed EF 55 to 60% with grade 1 diastolic dysfunction Continue Lasix/Coreg, not on ACE inhibitor/ARB due to CKD Continue hydralazine, consider Imdur   Anemia of chronic disease  Monitor H and H Transfuse if less than 7   Probable  obstructive sleep apnea Outpatient sleep study per pulmonology  DVT prophylaxis: SCDs only given bleed as above Code Status: Full Family Communication: None present  Status is: Inpatient   Dispo: The patient is from: Home              Anticipated d/c is to: SNF              Anticipated d/c date is: 24 to 48 hours              Patient currently not medically stable for discharge  Consultants:  PCCM  Procedures:  None  Antimicrobials:  None  Subjective: No acute issues or events overnight denies nausea vomiting diarrhea constipation headache fevers chills or chest pain  Objective: Vitals:   05/07/22 2128 05/07/22 2318 05/08/22 0331 05/08/22 0500  BP: (!) 164/105 (!) 173/104 (!) 160/111   Pulse: 84 79 82   Resp: (!) '21 18 18   '$ Temp: 97.8 F (36.6 C) 98.7 F (37.1 C) 98.4 F (36.9 C)   TempSrc:  Oral Oral   SpO2:  98% 99%   Weight:    103.2 kg  Height:        Intake/Output Summary (Last 24 hours) at 05/08/2022 0724 Last data filed at 05/08/2022 0700 Gross per 24 hour  Intake 1117.09 ml  Output 1750 ml  Net -632.91 ml   Filed Weights   05/05/22 1839 05/06/22 0246 05/08/22 0500  Weight: 102.1 kg 102.1 kg 103.2 kg    Examination:  General exam: Appears calm and comfortable  Respiratory system: Clear to auscultation. Respiratory effort normal. Cardiovascular system: S1 & S2 heard, RRR. No JVD, murmurs, rubs, gallops or clicks. No pedal edema. Gastrointestinal system: Abdomen is nondistended, soft and  nontender. No organomegaly or masses felt. Normal bowel sounds heard. Central nervous system: Alert and oriented. No focal neurological deficits. Extremities: Symmetric 5 x 5 power. Skin: No rashes, lesions or ulcers Psychiatry: Judgement and insight appear normal. Mood & affect appropriate.     Data Reviewed: I have personally reviewed following labs and imaging studies  CBC: Recent Labs  Lab 05/05/22 1833 05/07/22 0908 05/08/22 0540  WBC 11.5* 10.3 12.2*   NEUTROABS  --  8.6* 9.6*  HGB 10.5* 10.5* 10.5*  HCT 34.9* 34.7* 33.5*  MCV 89.5 87.4 85.5  PLT 256 257 536   Basic Metabolic Panel: Recent Labs  Lab 05/05/22 1833 05/06/22 0327 05/07/22 0908 05/08/22 0540  NA 140 139 137 139  K 5.4* 4.2 4.3 3.9  CL 110 108 108 106  CO2 19* 21* 24 21*  GLUCOSE 101* 133* 199* 116*  BUN 59* 56* 50* 49*  CREATININE 3.14* 3.22* 2.95* 3.26*  CALCIUM 7.6* 7.5* 7.4* 7.6*   GFR: Estimated Creatinine Clearance: 31.2 mL/min (A) (by C-G formula based on SCr of 3.26 mg/dL (H)). Liver Function Tests: Recent Labs  Lab 05/05/22 1833  AST 31  ALT 43  ALKPHOS 173*  BILITOT 0.5  PROT 7.0  ALBUMIN 3.3*   No results for input(s): "LIPASE", "AMYLASE" in the last 168 hours. No results for input(s): "AMMONIA" in the last 168 hours. Coagulation Profile: Recent Labs  Lab 05/05/22 2244  INR 1.6*   Cardiac Enzymes: No results for input(s): "CKTOTAL", "CKMB", "CKMBINDEX", "TROPONINI" in the last 168 hours. BNP (last 3 results) No results for input(s): "PROBNP" in the last 8760 hours. HbA1C: Recent Labs    05/05/22 2244  HGBA1C 5.7*   CBG: Recent Labs  Lab 05/07/22 1124 05/07/22 1531 05/07/22 1952 05/07/22 2319 05/08/22 0331  GLUCAP 210* 132* 179* 137* 120*   Lipid Profile: Recent Labs    05/05/22 2244  CHOL 95  HDL 35*  LDLCALC 34  TRIG 131  CHOLHDL 2.7   Thyroid Function Tests: No results for input(s): "TSH", "T4TOTAL", "FREET4", "T3FREE", "THYROIDAB" in the last 72 hours. Anemia Panel: Recent Labs    05/05/22 2244  VITAMINB12 721  FOLATE 16.8   Sepsis Labs: No results for input(s): "PROCALCITON", "LATICACIDVEN" in the last 168 hours.  Recent Results (from the past 240 hour(s))  MRSA Next Gen by PCR, Nasal     Status: None   Collection Time: 05/05/22 10:09 PM   Specimen: Nasal Mucosa; Nasal Swab  Result Value Ref Range Status   MRSA by PCR Next Gen NOT DETECTED NOT DETECTED Final    Comment: (NOTE) The GeneXpert MRSA  Assay (FDA approved for NASAL specimens only), is one component of a comprehensive MRSA colonization surveillance program. It is not intended to diagnose MRSA infection nor to guide or monitor treatment for MRSA infections. Test performance is not FDA approved in patients less than 15 years old. Performed at [facility], [place] [address] [place], [place] [address]          Radiology Studies: MR BRAIN WO CONTRAST  Result Date: 05/07/2022 CLINICAL DATA:  57 year old male who presented with right thalamic hemorrhage on 05/05/2022. EXAM: MRI HEAD WITHOUT CONTRAST MRA HEAD WITHOUT CONTRAST TECHNIQUE: Multiplanar, multi-echo pulse sequences of the brain and surrounding structures were acquired without intravenous contrast. Angiographic images of the Circle of Willis were acquired using MRA technique without intravenous contrast. COMPARISON:  Head CT 05/06/2022 and earlier. FINDINGS: MRI HEAD FINDINGS Brain: Oval and lobulated largely T2 hypointense,  T1 I so to slightly hyperintense intra-axial hematoma at the right thalamus measures 15 by 20 x 19 mm (AP by transverse by CC) on MRI for estimated blood volume of 3 mL, not significantly changed since presentation. Surrounding edema tracking from the right thalamus into the posterior right corona radiata, also into the right brainstem. But only mild regional mass effect. No convincing intraventricular blood. No ventriculomegaly. On DWI there is susceptibility associated with the right thalamic blood products, no regional diffusion restriction. And no restricted diffusion elsewhere. Chronic hemosiderin is present in the low left lateral pons. Faint chronic microhemorrhage also suspected in the lateral left thalamus on series 9, image 17. Also there is heterogeneity in the left deep cerebellar nucleus with suspicion of hemosiderin there, and confluent regional T2 and FLAIR hyperintensity but no regional mass effect (series 8, image 7). This area most  resembles encephalomalacia on the previous CT. And some of the T2 and FLAIR heterogeneity in the right brainstem might also be Wallerian degeneration. No other chronic cerebral blood products by T2 *. Basilar cisterns are normal. Scattered mild to moderate for age additional bilateral cerebral white matter T2 and FLAIR hyperintensity. Evidence of multiple small chronic lacunar infarcts in the other deep gray nuclei, including the bilateral lentiform. Cervicomedullary junction and pituitary are within normal limits. Vascular: Major intracranial vascular flow voids are preserved. Skull and upper cervical spine: Negative. Visualized bone marrow signal is within normal limits. Sinuses/Orbits: Globe motion artifact, otherwise negative orbits. Paranasal sinuses and mastoids are stable and well aerated. Other: Negative visible internal auditory structures. Visible scalp and face appear negative. MRA HEAD FINDINGS Anterior circulation: Antegrade flow in both ICA siphons. Mild bilateral siphon irregularity, no significant siphon stenosis. Patent carotid termini, MCA and ACA origins. Allowing for some motion artifact, proximal MCA and ACA branches are within normal limits. Posterior circulation: Antegrade flow in the posterior circulation with codominant distal vertebral arteries. Patent vertebrobasilar junction with no stenosis. Partially visible left PICA and right AICA appear patent. Patent basilar artery without stenosis. Basilar tip and PCA origins are patent. Posterior communicating arteries are diminutive or absent. Visible bilateral PCA branches are within normal limits when allowing for motion. There is no MRA flow signal identified within the right thalamic hematoma. No MRA evidence of vascular malformation. Anatomic variants: None significant. IMPRESSION: 1. Right thalamic hematoma is stable since 05/05/2022, estimated blood volume of 3 mL. Regional edema but no significant intracranial mass effect. And no IVH,  ventriculomegaly or other complicating features. 2. Negative motion degraded intracranial MRA. And evidence of chronic small vessel disease, with other chronic microhemorrhages in the left thalamus, left pons, and also the deep left cerebellum. Suspect some encephalomalacia in the cerebellum, and also some chronic Wallerian degeneration in the right brainstem. 3. No other acute intracranial abnormality. Electronically Signed   By: Genevie Ann M.D.   On: 05/07/2022 13:01   MR ANGIO HEAD WO CONTRAST  Result Date: 05/07/2022 CLINICAL DATA:  57 year old male who presented with right thalamic hemorrhage on 05/05/2022. EXAM: MRI HEAD WITHOUT CONTRAST MRA HEAD WITHOUT CONTRAST TECHNIQUE: Multiplanar, multi-echo pulse sequences of the brain and surrounding structures were acquired without intravenous contrast. Angiographic images of the Circle of Willis were acquired using MRA technique without intravenous contrast. COMPARISON:  Head CT 05/06/2022 and earlier. FINDINGS: MRI HEAD FINDINGS Brain: Oval and lobulated largely T2 hypointense, T1 I so to slightly hyperintense intra-axial hematoma at the right thalamus measures 15 by 20 x 19 mm (AP by transverse by CC) on  MRI for estimated blood volume of 3 mL, not significantly changed since presentation. Surrounding edema tracking from the right thalamus into the posterior right corona radiata, also into the right brainstem. But only mild regional mass effect. No convincing intraventricular blood. No ventriculomegaly. On DWI there is susceptibility associated with the right thalamic blood products, no regional diffusion restriction. And no restricted diffusion elsewhere. Chronic hemosiderin is present in the low left lateral pons. Faint chronic microhemorrhage also suspected in the lateral left thalamus on series 9, image 17. Also there is heterogeneity in the left deep cerebellar nucleus with suspicion of hemosiderin there, and confluent regional T2 and FLAIR hyperintensity but  no regional mass effect (series 8, image 7). This area most resembles encephalomalacia on the previous CT. And some of the T2 and FLAIR heterogeneity in the right brainstem might also be Wallerian degeneration. No other chronic cerebral blood products by T2 *. Basilar cisterns are normal. Scattered mild to moderate for age additional bilateral cerebral white matter T2 and FLAIR hyperintensity. Evidence of multiple small chronic lacunar infarcts in the other deep gray nuclei, including the bilateral lentiform. Cervicomedullary junction and pituitary are within normal limits. Vascular: Major intracranial vascular flow voids are preserved. Skull and upper cervical spine: Negative. Visualized bone marrow signal is within normal limits. Sinuses/Orbits: Globe motion artifact, otherwise negative orbits. Paranasal sinuses and mastoids are stable and well aerated. Other: Negative visible internal auditory structures. Visible scalp and face appear negative. MRA HEAD FINDINGS Anterior circulation: Antegrade flow in both ICA siphons. Mild bilateral siphon irregularity, no significant siphon stenosis. Patent carotid termini, MCA and ACA origins. Allowing for some motion artifact, proximal MCA and ACA branches are within normal limits. Posterior circulation: Antegrade flow in the posterior circulation with codominant distal vertebral arteries. Patent vertebrobasilar junction with no stenosis. Partially visible left PICA and right AICA appear patent. Patent basilar artery without stenosis. Basilar tip and PCA origins are patent. Posterior communicating arteries are diminutive or absent. Visible bilateral PCA branches are within normal limits when allowing for motion. There is no MRA flow signal identified within the right thalamic hematoma. No MRA evidence of vascular malformation. Anatomic variants: None significant. IMPRESSION: 1. Right thalamic hematoma is stable since 05/05/2022, estimated blood volume of 3 mL. Regional edema  but no significant intracranial mass effect. And no IVH, ventriculomegaly or other complicating features. 2. Negative motion degraded intracranial MRA. And evidence of chronic small vessel disease, with other chronic microhemorrhages in the left thalamus, left pons, and also the deep left cerebellum. Suspect some encephalomalacia in the cerebellum, and also some chronic Wallerian degeneration in the right brainstem. 3. No other acute intracranial abnormality. Electronically Signed   By: Genevie Ann M.D.   On: 05/07/2022 13:01   CT HEAD WO CONTRAST (5MM)  Result Date: 05/06/2022 CLINICAL DATA:  Follow-up examination for stroke. EXAM: CT HEAD WITHOUT CONTRAST TECHNIQUE: Contiguous axial images were obtained from the base of the skull through the vertex without intravenous contrast. RADIATION DOSE REDUCTION: This exam was performed according to the departmental dose-optimization program which includes automated exposure control, adjustment of the mA and/or kV according to patient size and/or use of iterative reconstruction technique. COMPARISON:  Prior CT from earlier the same day. FINDINGS: Brain: Again the previously identified intraparenchymal hematoma centered at the right thalamus again seen, not significantly changed in size and morphology as compared to previous. Mild localized edema without significant regional mass effect. No intraventricular extension or other complicating features. No significant midline shift. Basilar cisterns remain  patent. No new intracranial hemorrhage or large vessel territory infarct. No mass lesion. No hydrocephalus or extra-axial fluid collection. Vascular: No abnormal hyperdense vessel. Skull: Scalp soft tissues and calvarium demonstrate no new finding. Sinuses/Orbits: Globes and orbital soft tissues within normal limits. Paranasal sinuses and mastoid air cells remain largely clear. Other: None. IMPRESSION: 1. No significant interval change in size and morphology of intraparenchymal  hematoma centered at the right thalamus. Mild localized edema without significant regional mass effect. 2. No other new acute intracranial abnormality. Electronically Signed   By: Jeannine Boga M.D.   On: 05/06/2022 20:30   ECHOCARDIOGRAM COMPLETE  Result Date: 05/06/2022    ECHOCARDIOGRAM REPORT   Patient Name:   WEBSTER PATRONE Date of Exam: 05/06/2022 Medical Rec #:  [id number]    Height:       73.0 in Accession #:    [id number]   Weight:       225.0 lb Date of Birth:  May 27, 1965    BSA:          2.262 m Patient Age:    57 years     BP:           143/91 mmHg Patient Gender: M            HR:           95 bpm. Exam Location:  Inpatient Procedure: 2D Echo, Cardiac Doppler and Color Doppler Indications:    I63.9 Stroke  History:        Patient has prior history of Echocardiogram examinations, most                 recent 12/24/2020. CKD and Stroke; Risk Factors:Hypertension,                 Diabetes, Dyslipidemia and Former Smoker.  Sonographer:    Wilkie Aye RVT RCS Referring Phys: [id number] [place]  Sonographer Comments: Technically challenging due to patient unable to stay awake; sleep apenea and respiratory motion. Some images may be off axis. IMPRESSIONS  1. Left ventricular ejection fraction, by estimation, is 55 to 60%. The left ventricle has normal function. The left ventricle has no regional wall motion abnormalities. There is severe concentric left ventricular hypertrophy. Left ventricular diastolic  parameters are consistent with Grade II diastolic dysfunction (pseudonormalization).  2. Right ventricular systolic function is normal. The right ventricular size is normal. Tricuspid regurgitation signal is inadequate for assessing PA pressure.  3. Right atrial size was mildly dilated.  4. The mitral valve is normal in structure. Trivial mitral valve regurgitation. No evidence of mitral stenosis.  5. The aortic valve is tricuspid. There is moderate calcification of the aortic valve. Aortic valve  regurgitation is not visualized. No aortic stenosis is present.  6. The inferior vena cava is dilated in size with >50% respiratory variability, suggesting right atrial pressure of 8 mmHg.  7. Degree of LVH is concerning for cardiac amyloidosis versus long-standing severe hypertension.  8. Left pleural effusion noted. Comparison(s): EF 60-65%. FINDINGS  Left Ventricle: Left ventricular ejection fraction, by estimation, is 55 to 60%. The left ventricle has normal function. The left ventricle has no regional wall motion abnormalities. The left ventricular internal cavity size was normal in size. There is  severe concentric left ventricular hypertrophy. Left ventricular diastolic parameters are consistent with Grade II diastolic dysfunction (pseudonormalization). Right Ventricle: The right ventricular size is normal. No increase in right ventricular wall thickness. Right ventricular systolic function is normal. Tricuspid regurgitation signal  is inadequate for assessing PA pressure. Left Atrium: Left atrial size was normal in size. Right Atrium: Right atrial size was mildly dilated. Pericardium: Left pleural effusion noted. Trivial pericardial effusion is present. Mitral Valve: The mitral valve is normal in structure. Trivial mitral valve regurgitation. No evidence of mitral valve stenosis. Tricuspid Valve: The tricuspid valve is normal in structure. Tricuspid valve regurgitation is not demonstrated. Aortic Valve: The aortic valve is tricuspid. There is moderate calcification of the aortic valve. Aortic valve regurgitation is not visualized. No aortic stenosis is present. Aortic valve mean gradient measures 6.0 mmHg. Aortic valve peak gradient measures 11.4 mmHg. Aortic valve area, by VTI measures 2.38 cm. Pulmonic Valve: The pulmonic valve was normal in structure. Pulmonic valve regurgitation is trivial. Aorta: The aortic root is normal in size and structure. Venous: The inferior vena cava is dilated in size with  greater than 50% respiratory variability, suggesting right atrial pressure of 8 mmHg. IAS/Shunts: No atrial level shunt detected by color flow Doppler.  LEFT VENTRICLE PLAX 2D LVIDd:         4.60 cm   Diastology LVIDs:         2.80 cm   LV e' medial:    4.72 cm/s LV PW:         1.80 cm   LV E/e' medial:  22.9 LV IVS:        2.30 cm   LV e' lateral:   5.40 cm/s LVOT diam:     2.20 cm   LV E/e' lateral: 20.0 LV SV:         70 LV SV Index:   31 LVOT Area:     3.80 cm  RIGHT VENTRICLE             IVC RV Basal diam:  3.80 cm     IVC diam: 2.30 cm RV Mid diam:    2.40 cm RV S prime:     13.30 cm/s TAPSE (M-mode): 1.8 cm LEFT ATRIUM           Index        RIGHT ATRIUM           Index LA diam:      4.70 cm 2.08 cm/m   RA Area:     20.20 cm LA Vol (A2C): 70.4 ml 31.12 ml/m  RA Volume:   58.50 ml  25.86 ml/m LA Vol (A4C): 49.3 ml 21.79 ml/m  AORTIC VALVE                     PULMONIC VALVE AV Area (Vmax):    2.50 cm      PV Vmax:       0.81 m/s AV Area (Vmean):   2.21 cm      PV Peak grad:  2.6 mmHg AV Area (VTI):     2.38 cm AV Vmax:           169.00 cm/s AV Vmean:          116.000 cm/s AV VTI:            0.296 m AV Peak Grad:      11.4 mmHg AV Mean Grad:      6.0 mmHg LVOT Vmax:         111.00 cm/s LVOT Vmean:        67.300 cm/s LVOT VTI:          0.185 m LVOT/AV VTI ratio: 0.62  AORTA Ao Root  diam: 3.20 cm Ao Asc diam:  3.50 cm MITRAL VALVE MV Area (PHT): 4.49 cm     SHUNTS MV Decel Time: 169 msec     Systemic VTI:  0.18 m MV E velocity: 108.00 cm/s  Systemic Diam: 2.20 cm MV A velocity: 40.70 cm/s MV E/A ratio:  2.65 Dalton McleanMD Electronically signed by Franki Monte Signature Date/Time: 05/06/2022/12:52:35 PM    Final    DG CHEST PORT 1 VIEW  Result Date: 05/06/2022 CLINICAL DATA:  Congestive heart failure. EXAM: PORTABLE CHEST 1 VIEW COMPARISON:  05/05/2022 FINDINGS: Stable cardiomegaly. Symmetric bilateral lower lung airspace disease shows no significant change. Small layering bilateral pleural  effusions cannot be excluded. IMPRESSION: No significant change in cardiomegaly and bilateral lower lung airspace disease. Possible small layering bilateral pleural effusions. Electronically Signed   By: Marlaine Hind M.D.   On: 05/06/2022 08:03    Scheduled Meds:  amLODipine  10 mg Oral Daily   atorvastatin  80 mg Oral Daily   carvedilol  25 mg Oral BID WC   Chlorhexidine Gluconate Cloth  6 each Topical Daily   furosemide  40 mg Intravenous Q12H   hydrALAZINE  50 mg Oral Q6H   insulin aspart  0-9 Units Subcutaneous Q4H   pantoprazole  40 mg Oral QHS   QUEtiapine  50 mg Oral BID   senna-docusate  1 tablet Oral BID   Continuous Infusions:  clevidipine Stopped (05/07/22 1130)     LOS: 3 days   Time spent: 9mn  Casin Federici C Socrates Cahoon, DO [facility]  If 7PM-7AM, please contact night-coverage [email or web address]  05/08/2022, 7:24 AM

## 2022-05-08 NOTE — Progress Notes (Signed)
Orthopedic Tech Progress Note Patient Details:  Craig West 30-Dec-1965 [id number] Shoulder immobilizer was delivered to the patient's room.  Ortho Devices Type of Ortho Device: Sling immobilizer Ortho Device/Splint Interventions: Ordered      Natesha Hassey E Evolett Somarriba 05/08/2022, 1:56 PM

## 2022-05-08 NOTE — Progress Notes (Signed)
STROKE TEAM PROGRESS NOTE   INTERVAL HISTORY No family at the bedside.   Doing well this morning.  More awake and alert interactive.  He has no complaints no headaches. Vitals:   05/08/22 0500 05/08/22 0814 05/08/22 1142 05/08/22 1554  BP:  (!) 163/106 (!) 156/90 139/78  Pulse:  71 73 80  Resp:  '18 16 16  '$ Temp:  98.1 F (36.7 C) 98.6 F (37 C) 98 F (36.7 C)  TempSrc:      SpO2:  98% 99% 98%  Weight: 103.2 kg     Height:       CBC:  Recent Labs  Lab 05/07/22 0908 05/08/22 0540  WBC 10.3 12.2*  NEUTROABS 8.6* 9.6*  HGB 10.5* 10.5*  HCT 34.7* 33.5*  MCV 87.4 85.5  PLT 257 272    Basic Metabolic Panel:  Recent Labs  Lab 05/07/22 0908 05/08/22 0540  NA 137 139  K 4.3 3.9  CL 108 106  CO2 24 21*  GLUCOSE 199* 116*  BUN 50* 49*  CREATININE 2.95* 3.26*  CALCIUM 7.4* 7.6*    Lipid Panel:  Recent Labs  Lab 05/05/22 2244  CHOL 95  TRIG 131  HDL 35*  CHOLHDL 2.7  VLDL 26  LDLCALC 34    HgbA1c:  Recent Labs  Lab 05/05/22 2244  HGBA1C 5.7*    Urine Drug Screen: No results for input(s): "LABOPIA", "COCAINSCRNUR", "LABBENZ", "AMPHETMU", "THCU", "LABBARB" in the last 168 hours.  Alcohol Level No results for input(s): "ETH" in the last 168 hours.  IMAGING past 24 hours No results found.  PHYSICAL EXAM Constitutional: Appears well-developed and well-nourished.  Respiratory: NonlaboredNeuro: Mental Status: Awake and alert.  Answering questions normal interaction.  Can maintain wakefulness.  Oriented to person place and time. No signs of aphasia or neglect Cranial Nerves: II:  Pupils are equal, round, and reactive to light.   III,IV, VI: EOMI without ptosis or diploplia.  V: Facial sensation is symmetric to temperature VII: left facial droop  VIII: hearing is intact to voice X: Uvula elevates symmetrically XI: Shoulder shrug is symmetric. XII: tongue is midline without atrophy or fasciculations.  Motor: Tone is normal. Bulk is normal. 5/5 strength  was present in on the right side.  On the left  3/5 in the upper extremity, 4/5 lower extremity Sensory: Left side no sensation  Deep Tendon Reflexes: Hypoactive throughout Plantars: Toes are mute on the left, downgoing on the right Cerebellar: FNF and HKS are intact within limits of weakness Gait:  Deferred for safety  ASSESSMENT/PLAN [name] is a 57 y.o. male with history of hypertension, diabetes (reports diet and exercise controlled), hyperlipidemia, asthma, remote smoking (currently smoking marijuana once or twice daily), gout presenting with left sided numbness and weakness.   Stroke:  Right thalamic hemorrhage Etiology:  likely due to uncontrolled HTN vs stroke with hemorrhagic conversion Code Stroke CT head Acute hypertensive type hemorrhage in the right thalamus measuring 2.6 mL. Repeat CT Head- Unchanged size of intraparenchymal hematoma in the right thalamus. No new hemorrhage. MRI  Right thalamic ICH. Stable. MRA Head neg. 2D Echo EF is 53-66%, grade 2 diastolic dysfunction, right atrium mildly dilated LDL 34 HgbA1c 5.7 VTE prophylaxis -SCDs    Diet   Diet Heart Room service appropriate? Yes with Assist; Fluid consistency: Thin   No antithrombotic prior to admission, now on No antithrombotic.  Therapy recommendations:  SNF Disposition:  Pending  Hypertension Congestive Heart failure Home meds:  Amlodipine, coreg, hydralazine, lasix  Unstable BP goal systolic less than 076 Long-term BP goal normotensive  Hyperlipidemia Home meds:  Atorvastatin '80mg'$ , resumed in hospital LDL 34, goal < 70 Continue statin at discharge  Diabetes type II Controlled Home meds:  None HgbA1c 5.7, goal < 7.0 CBGs Recent Labs    05/08/22 0814 05/08/22 1137 05/08/22 1605  GLUCAP 109* 150* 172*     SSI  Other Stroke Risk Factors Marijuana use Congestive heart failure  Other Active Problems Gout Allopurinol, colchicine Asthma 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  3.22, BUN 56, GFR 21 Agitation Seroquel '50mg'$  BID started 2/4  Hospital day # 5  57 year old with intracranial hemorrhage right thalamus with some edema. Stroke workup complete. No need for routine imaging. Goal sBP<160. Exam is stable. Stat head CT with acute neuro change and call Neurology. No antiplatelets due to bleed.   F/u in stroke clinic after SNF stay.   Neurology will sign off, call with questions.   Torria Fromer,MD   To contact Stroke Continuity provider, please refer to [email or web address] After hours, contact General Neurology

## 2022-05-08 NOTE — NC FL2 (Signed)
[place] LEVEL OF CARE FORM     IDENTIFICATION  Patient Name: Craig West Birthdate: 1966/01/05 Sex: male Admission Date (Current Location): 05/05/2022  [facility] and [place] Number:  Herbalist and Address:  [facility], [place] [address] [place], [place] [address]      Provider Number: [id number]  Attending Physician Name and Address:  Little Ishikawa, MD  Relative Name and Phone Number:       Current Level of Care: Hospital Recommended Level of Care: [place] Prior Approval Number:    Date Approved/Denied:   PASRR Number: [id number]  Discharge Plan: SNF    Current Diagnoses: Patient Active Problem List   Diagnosis Date Noted   Stroke [facility]) 05/05/2022   Hemorrhagic stroke ([place]) 05/05/2022   Hypertensive emergency 05/05/2022   Nontraumatic intracerebral hemorrhage ([place]) 05/05/2022   BPH (benign prostatic hyperplasia) 03/27/2021   Fatigue 11/23/2020   Leg heaviness 11/23/2020   NSVT (nonsustained ventricular tachycardia) ([place]) 11/23/2020   Diabetes mellitus without complication ([place])    CKD (chronic kidney disease), stage IV ([place])    PICC (peripherally inserted central catheter) in place 03/27/2020   Rectal cancer ([place]) 01/27/2020   Benign neoplasm of ascending colon    Benign neoplasm of descending colon    Benign neoplasm of sigmoid colon    Rectal mass    Rectal bleeding    Iron deficiency anemia    Chronic diastolic heart failure (HCC)    Hyperkalemia    Volume overload 01/06/2020   Demand ischemia    COVID-19 virus infection 04/2019   Obesity (BMI 30.0-34.9) 12/09/2017   Non compliance w medication regimen 04/18/2017   Gout 08/02/2016   Diabetic neuropathy ([place]) 08/02/2016   Hyperlipidemia 10/24/2015   Asthma 10/16/2015   Type 2 diabetes mellitus without complication ([place]) 80/16/5537   Essential hypertension 06/13/2014   Smoker 06/13/2014    Orientation RESPIRATION BLADDER Height  & Weight     Self, Time, Situation, Place  Normal (heart healthy) Incontinent Weight: 227 lb 8.2 oz (103.2 kg) Height:  '6\' 1"'$  (185.4 cm)  BEHAVIORAL SYMPTOMS/MOOD NEUROLOGICAL BOWEL NUTRITION STATUS      Continent Diet  AMBULATORY STATUS COMMUNICATION OF NEEDS Skin   Extensive Assist Verbally Normal                       Personal Care Assistance Level of Assistance  Bathing, Feeding, Dressing Bathing Assistance: Maximum assistance Feeding assistance: Limited assistance Dressing Assistance: Maximum assistance     Functional Limitations Info             SPECIAL CARE FACTORS FREQUENCY  PT (By licensed PT), OT (By licensed OT), Speech therapy     PT Frequency: 5x/wk OT Frequency: 5x/wk     Speech Therapy Frequency: 5x/wk      Contractures Contractures Info: Not present    Additional Factors Info  Code Status, Allergies, Psychotropic, Insulin Sliding Scale Code Status Info: Full Allergies Info: NKA Psychotropic Info: Seroquel '50mg'$  2x/day Insulin Sliding Scale Info: see DC summary       Current Medications (05/08/2022):  This is the current hospital active medication list Current Facility-Administered Medications  Medication Dose Route Frequency Provider Last Rate Last Admin   acetaminophen (TYLENOL) tablet 650 mg  650 mg Oral Q4H PRN Bhagat, Srishti L, MD   650 mg at 05/06/22 0316   Or   acetaminophen (TYLENOL) 160 MG/5ML solution 650 mg  650 mg Per  Tube Q4H PRN Bhagat, Srishti L, MD       Or   acetaminophen (TYLENOL) suppository 650 mg  650 mg Rectal Q4H PRN Bhagat, Srishti L, MD       albuterol (PROVENTIL) (2.5 MG/3ML) 0.083% nebulizer solution 2.5 mg  2.5 mg Nebulization Q2H PRN Cristal Generous, NP   2.5 mg at 05/07/22 0821   amLODipine (NORVASC) tablet 10 mg  10 mg Oral Daily Bowser, Laurel Dimmer, NP   10 mg at 05/08/22 9379   atorvastatin (LIPITOR) tablet 80 mg  80 mg Oral Daily Bowser, Laurel Dimmer, NP   80 mg at 05/08/22 0240   carvedilol (COREG) tablet 25 mg  25  mg Oral BID WC Cristal Generous, NP   25 mg at 05/08/22 9735   Chlorhexidine Gluconate Cloth 2 % PADS 6 each  6 each Topical Daily Audria Nine, DO   6 each at 05/08/22 3299   clevidipine (CLEVIPREX) infusion 0.5 mg/mL  0-21 mg/hr Intravenous Continuous Jacky Kindle, MD   Stopped at 05/07/22 1130   diazepam (VALIUM) injection 2.5 mg  2.5 mg Intravenous PRN Dorene Grebe, MD       docusate sodium (COLACE) capsule 100 mg  100 mg Oral BID PRN Bowser, Laurel Dimmer, NP       furosemide (LASIX) injection 40 mg  40 mg Intravenous Q12H Jacky Kindle, MD   40 mg at 05/08/22 2426   hydrALAZINE (APRESOLINE) tablet 50 mg  50 mg Oral Q6H Chand, Currie Paris, MD   50 mg at 05/08/22 0938   insulin aspart (novoLOG) injection 0-9 Units  0-9 Units Subcutaneous Q4H Chand, Currie Paris, MD   1 Units at 05/08/22 0000   labetalol (NORMODYNE) injection 10 mg  10 mg Intravenous Q2H PRN Bhagat, Srishti L, MD   10 mg at 05/06/22 0253   naloxone [facility]) injection 0.4 mg  0.4 mg Intravenous PRN Laqueta Jean, MD       ondansetron [facility]) injection 4 mg  4 mg Intravenous Q6H PRN Cristal Generous, NP       Oral care mouth rinse  15 mL Mouth Rinse PRN Laqueta Jean, MD       pantoprazole (PROTONIX) EC tablet 40 mg  40 mg Oral QHS Jacky Kindle, MD   40 mg at 05/07/22 2234   polyethylene glycol (MIRALAX / GLYCOLAX) packet 17 g  17 g Oral Daily PRN Bowser, Laurel Dimmer, NP       QUEtiapine (SEROQUEL) tablet 50 mg  50 mg Oral BID Jacky Kindle, MD   50 mg at 05/08/22 8341   senna-docusate (Senokot-S) tablet 1 tablet  1 tablet Oral BID Lorenza Chick, MD   1 tablet at 05/07/22 2234     Discharge Medications: Please see discharge summary for a list of discharge medications.  Relevant Imaging Results:  Relevant Lab Results:   Additional Information SS#: [id number]  Geralynn Ochs, LCSW

## 2022-05-09 ENCOUNTER — Encounter (HOSPITAL_COMMUNITY): Payer: Self-pay | Admitting: Critical Care Medicine

## 2022-05-09 DIAGNOSIS — I6389 Other cerebral infarction: Secondary | ICD-10-CM | POA: Diagnosis not present

## 2022-05-09 LAB — CBC WITH DIFFERENTIAL/PLATELET
Abs Immature Granulocytes: 0.08 10*3/uL — ABNORMAL HIGH (ref 0.00–0.07)
Basophils Absolute: 0.1 10*3/uL (ref 0.0–0.1)
Basophils Relative: 0 %
Eosinophils Absolute: 0.3 10*3/uL (ref 0.0–0.5)
Eosinophils Relative: 3 %
HCT: 34 % — ABNORMAL LOW (ref 39.0–52.0)
Hemoglobin: 10.8 g/dL — ABNORMAL LOW (ref 13.0–17.0)
Immature Granulocytes: 1 %
Lymphocytes Relative: 6 %
Lymphs Abs: 0.8 10*3/uL (ref 0.7–4.0)
MCH: 27 pg (ref 26.0–34.0)
MCHC: 31.8 g/dL (ref 30.0–36.0)
MCV: 85 fL (ref 80.0–100.0)
Monocytes Absolute: 1.7 10*3/uL — ABNORMAL HIGH (ref 0.1–1.0)
Monocytes Relative: 14 %
Neutro Abs: 9.7 10*3/uL — ABNORMAL HIGH (ref 1.7–7.7)
Neutrophils Relative %: 76 %
Platelets: 227 10*3/uL (ref 150–400)
RBC: 4 MIL/uL — ABNORMAL LOW (ref 4.22–5.81)
RDW: 18.1 % — ABNORMAL HIGH (ref 11.5–15.5)
WBC: 12.6 10*3/uL — ABNORMAL HIGH (ref 4.0–10.5)
nRBC: 0 % (ref 0.0–0.2)

## 2022-05-09 LAB — BASIC METABOLIC PANEL
Anion gap: 9 (ref 5–15)
BUN: 50 mg/dL — ABNORMAL HIGH (ref 6–20)
CO2: 23 mmol/L (ref 22–32)
Calcium: 8 mg/dL — ABNORMAL LOW (ref 8.9–10.3)
Chloride: 103 mmol/L (ref 98–111)
Creatinine, Ser: 3.53 mg/dL — ABNORMAL HIGH (ref 0.61–1.24)
GFR, Estimated: 19 mL/min — ABNORMAL LOW (ref >60)
Glucose, Bld: 119 mg/dL — ABNORMAL HIGH (ref 70–99)
Potassium: 4.2 mmol/L (ref 3.5–5.1)
Sodium: 135 mmol/L (ref 135–145)

## 2022-05-09 LAB — GLUCOSE, CAPILLARY
Glucose-Capillary: 123 mg/dL — ABNORMAL HIGH (ref 70–99)
Glucose-Capillary: 122 mg/dL — ABNORMAL HIGH (ref 70–99)
Glucose-Capillary: 127 mg/dL — ABNORMAL HIGH (ref 70–99)
Glucose-Capillary: 115 mg/dL — ABNORMAL HIGH (ref 70–99)
Glucose-Capillary: 183 mg/dL — ABNORMAL HIGH (ref 70–99)
Glucose-Capillary: 183 mg/dL — ABNORMAL HIGH (ref 70–99)
Glucose-Capillary: 179 mg/dL — ABNORMAL HIGH (ref 70–99)

## 2022-05-09 LAB — MAGNESIUM: Magnesium: 1.9 mg/dL (ref 1.7–2.4)

## 2022-05-09 MED ORDER — COLCHICINE 0.6 MG PO TABS
0.6000 mg | ORAL_TABLET | Freq: Two times a day (BID) | ORAL | Status: AC
  Administered 2022-05-09 – 2022-05-12 (×6): 0.6 mg via ORAL
  Filled 2022-05-09 (×6): qty 1

## 2022-05-09 MED ORDER — ISOSORBIDE MONONITRATE ER 30 MG PO TB24
30.0000 mg | ORAL_TABLET | Freq: Every day | ORAL | Status: DC
  Administered 2022-05-09 – 2022-05-13 (×5): 30 mg via ORAL
  Filled 2022-05-09 (×7): qty 1

## 2022-05-09 NOTE — Progress Notes (Signed)
PROGRESS NOTE    Craig West  JSE:[id number] DOB: 11-Oct-1965 DOA: 05/05/2022 PCP: Charlott Rakes, MD   Brief Narrative:  57 yo PMH DM (lifestyle control) asthma,  HTN HLD gout hx tobacco use and current mj use, presented to ED 2/4 with L sided numbness and weakness over the prior 48h - progressed to a point he couldn't take his home medications. Hypertensive in ED, started on cleviprex gtt - taken to neuro ICU  given hemorrhagic stroke on CT at intake. Thought to be ischemic with conversion. Transferred to hospitalist group 05/08/22  Assessment & Plan:   Principal Problem:   Stroke [facility]) Active Problems:   Hemorrhagic stroke [facility])   Hypertensive emergency   Nontraumatic intracerebral hemorrhage ([place])  Acute right thalamic intraparenchymal hemorrhage Probable right thalamic acute ischemic stroke with hemorrhagic conversion Cerebral edema Repeat MRI/MRA shows right thalamic hematoma stable compared 05/05/22 Continue aggressive blood pressure control, see below Cleviprex weaned off Continue to hold anticoagulation or antiplatelets   Hypertensive emergency leading to intraparenchymal hemorrhage Likely the cause of above Blood pressure improving but not yet to goal, add Imdur 30 daily Continue Coreg and amlodipine, hydralazine - all these are at max dose   Acute delirium, resolved Continue delirium precautions Decrease Seroquel p.m. dose, discontinue a.m. dose given increased somnolence in the morning   CKD stage IV Non-anion gap metabolic acidosis, improving Serum creatinine is at baseline, around 3.5 Follow UOP   Prediabetes, well controlled Patient hemoglobin A1c is 5.7 Continue sliding scale insulin with CBG goal 140-180   Chronic HFpEF Without acute exacerbation Repeat echocardiogram showed EF 55 to 60% with grade 1 diastolic dysfunction Continue Lasix/Coreg, not on ACE inhibitor/ARB due to CKD Continue hydralazine, consider Imdur   Chronic anemia of chronic disease   Patient hemoglobin at baseline, around 10   Probable obstructive sleep apnea Outpatient sleep study recommended per pulmonology  DVT prophylaxis: SCDs only given bleed as above Code Status: Full Family Communication: None present  Status is: Inpatient   Dispo: The patient is from: Home              Anticipated d/c is to: SNF              Anticipated d/c date is: Imminent              Patient currently is medically stable for discharge, awaiting safe disposition at SNF  Consultants:  PCCM  Procedures:  None  Antimicrobials:  None  Subjective: No acute issues or events overnight denies nausea vomiting diarrhea constipation headache fevers chills or chest pain  Objective: Vitals:   05/08/22 2341 05/09/22 0327 05/09/22 0633 05/09/22 0719  BP: (!) 145/92 (!) 160/95 (!) 176/92 (!) 150/91  Pulse: 68 73  77  Resp: '18 18  20  '$ Temp: 98.2 F (36.8 C) 98.6 F (37 C)  97.9 F (36.6 C)  TempSrc:    Oral  SpO2: 98% 100%  99%  Weight:      Height:        Intake/Output Summary (Last 24 hours) at 05/09/2022 0731 Last data filed at 05/09/2022 0200 Gross per 24 hour  Intake 840 ml  Output 3150 ml  Net -2310 ml    Filed Weights   05/05/22 1839 05/06/22 0246 05/08/22 0500  Weight: 102.1 kg 102.1 kg 103.2 kg    Examination:  General:  Pleasantly resting in bed, No acute distress. HEENT:  Normocephalic atraumatic.  Sclerae nonicteric, noninjected.  Extraocular movements intact bilaterally. Neck:  Without mass  or deformity.  Trachea is midline. Lungs:  Clear to auscultate bilaterally without rhonchi, wheeze, or rales. Heart:  Regular rate and rhythm.  Without murmurs, rubs, or gallops. Abdomen:  Soft, nontender, nondistended.  Without guarding or rebound. Extremities: Without cyanosis, clubbing, edema, or obvious deformity. Vascular:  Dorsalis pedis and posterior tibial pulses palpable bilaterally. Skin:  Warm and dry, no erythema, no rashes   Data Reviewed: I have  personally reviewed following labs and imaging studies  CBC: Recent Labs  Lab 05/05/22 1833 05/07/22 0908 05/08/22 0540 05/09/22 0311  WBC 11.5* 10.3 12.2* 12.6*  NEUTROABS  --  8.6* 9.6* 9.7*  HGB 10.5* 10.5* 10.5* 10.8*  HCT 34.9* 34.7* 33.5* 34.0*  MCV 89.5 87.4 85.5 85.0  PLT 256 257 241 811    Basic Metabolic Panel: Recent Labs  Lab 05/05/22 1833 05/06/22 0327 05/07/22 0908 05/08/22 0540 05/09/22 0311  NA 140 139 137 139 135  K 5.4* 4.2 4.3 3.9 4.2  CL 110 108 108 106 103  CO2 19* 21* 24 21* 23  GLUCOSE 101* 133* 199* 116* 119*  BUN 59* 56* 50* 49* 50*  CREATININE 3.14* 3.22* 2.95* 3.26* 3.53*  CALCIUM 7.6* 7.5* 7.4* 7.6* 8.0*  MG  --   --   --   --  1.9    GFR: Estimated Creatinine Clearance: 28.8 mL/min (A) (by C-G formula based on SCr of 3.53 mg/dL (H)). Liver Function Tests: Recent Labs  Lab 05/05/22 1833  AST 31  ALT 43  ALKPHOS 173*  BILITOT 0.5  PROT 7.0  ALBUMIN 3.3*    No results for input(s): "LIPASE", "AMYLASE" in the last 168 hours. No results for input(s): "AMMONIA" in the last 168 hours. Coagulation Profile: Recent Labs  Lab 05/05/22 2244  INR 1.6*    Cardiac Enzymes: No results for input(s): "CKTOTAL", "CKMB", "CKMBINDEX", "TROPONINI" in the last 168 hours. BNP (last 3 results) No results for input(s): "PROBNP" in the last 8760 hours. HbA1C: No results for input(s): "HGBA1C" in the last 72 hours.  CBG: Recent Labs  Lab 05/08/22 2213 05/08/22 2340 05/09/22 0222 05/09/22 0323 05/09/22 0624  GLUCAP 188* 146* 123* 122* 127*    Lipid Profile: No results for input(s): "CHOL", "HDL", "LDLCALC", "TRIG", "CHOLHDL", "LDLDIRECT" in the last 72 hours.  Thyroid Function Tests: No results for input(s): "TSH", "T4TOTAL", "FREET4", "T3FREE", "THYROIDAB" in the last 72 hours. Anemia Panel: No results for input(s): "VITAMINB12", "FOLATE", "FERRITIN", "TIBC", "IRON", "RETICCTPCT" in the last 72 hours.  Sepsis Labs: No results for  input(s): "PROCALCITON", "LATICACIDVEN" in the last 168 hours.  Recent Results (from the past 240 hour(s))  MRSA Next Gen by PCR, Nasal     Status: None   Collection Time: 05/05/22 10:09 PM   Specimen: Nasal Mucosa; Nasal Swab  Result Value Ref Range Status   MRSA by PCR Next Gen NOT DETECTED NOT DETECTED Final    Comment: (NOTE) The GeneXpert MRSA Assay (FDA approved for NASAL specimens only), is one component of a comprehensive MRSA colonization surveillance program. It is not intended to diagnose MRSA infection nor to guide or monitor treatment for MRSA infections. Test performance is not FDA approved in patients less than 40 years old. Performed at [facility], [place] [address] [place], [place] [address]          Radiology Studies: MR BRAIN WO CONTRAST  Result Date: 05/07/2022 CLINICAL DATA:  57 year old male who presented with right thalamic hemorrhage on 05/05/2022. EXAM: MRI HEAD WITHOUT CONTRAST MRA  HEAD WITHOUT CONTRAST TECHNIQUE: Multiplanar, multi-echo pulse sequences of the brain and surrounding structures were acquired without intravenous contrast. Angiographic images of the Circle of Willis were acquired using MRA technique without intravenous contrast. COMPARISON:  Head CT 05/06/2022 and earlier. FINDINGS: MRI HEAD FINDINGS Brain: Oval and lobulated largely T2 hypointense, T1 I so to slightly hyperintense intra-axial hematoma at the right thalamus measures 15 by 20 x 19 mm (AP by transverse by CC) on MRI for estimated blood volume of 3 mL, not significantly changed since presentation. Surrounding edema tracking from the right thalamus into the posterior right corona radiata, also into the right brainstem. But only mild regional mass effect. No convincing intraventricular blood. No ventriculomegaly. On DWI there is susceptibility associated with the right thalamic blood products, no regional diffusion restriction. And no restricted diffusion elsewhere. Chronic hemosiderin  is present in the low left lateral pons. Faint chronic microhemorrhage also suspected in the lateral left thalamus on series 9, image 17. Also there is heterogeneity in the left deep cerebellar nucleus with suspicion of hemosiderin there, and confluent regional T2 and FLAIR hyperintensity but no regional mass effect (series 8, image 7). This area most resembles encephalomalacia on the previous CT. And some of the T2 and FLAIR heterogeneity in the right brainstem might also be Wallerian degeneration. No other chronic cerebral blood products by T2 *. Basilar cisterns are normal. Scattered mild to moderate for age additional bilateral cerebral white matter T2 and FLAIR hyperintensity. Evidence of multiple small chronic lacunar infarcts in the other deep gray nuclei, including the bilateral lentiform. Cervicomedullary junction and pituitary are within normal limits. Vascular: Major intracranial vascular flow voids are preserved. Skull and upper cervical spine: Negative. Visualized bone marrow signal is within normal limits. Sinuses/Orbits: Globe motion artifact, otherwise negative orbits. Paranasal sinuses and mastoids are stable and well aerated. Other: Negative visible internal auditory structures. Visible scalp and face appear negative. MRA HEAD FINDINGS Anterior circulation: Antegrade flow in both ICA siphons. Mild bilateral siphon irregularity, no significant siphon stenosis. Patent carotid termini, MCA and ACA origins. Allowing for some motion artifact, proximal MCA and ACA branches are within normal limits. Posterior circulation: Antegrade flow in the posterior circulation with codominant distal vertebral arteries. Patent vertebrobasilar junction with no stenosis. Partially visible left PICA and right AICA appear patent. Patent basilar artery without stenosis. Basilar tip and PCA origins are patent. Posterior communicating arteries are diminutive or absent. Visible bilateral PCA branches are within normal limits  when allowing for motion. There is no MRA flow signal identified within the right thalamic hematoma. No MRA evidence of vascular malformation. Anatomic variants: None significant. IMPRESSION: 1. Right thalamic hematoma is stable since 05/05/2022, estimated blood volume of 3 mL. Regional edema but no significant intracranial mass effect. And no IVH, ventriculomegaly or other complicating features. 2. Negative motion degraded intracranial MRA. And evidence of chronic small vessel disease, with other chronic microhemorrhages in the left thalamus, left pons, and also the deep left cerebellum. Suspect some encephalomalacia in the cerebellum, and also some chronic Wallerian degeneration in the right brainstem. 3. No other acute intracranial abnormality. Electronically Signed   By: Genevie Ann M.D.   On: 05/07/2022 13:01   MR ANGIO HEAD WO CONTRAST  Result Date: 05/07/2022 CLINICAL DATA:  57 year old male who presented with right thalamic hemorrhage on 05/05/2022. EXAM: MRI HEAD WITHOUT CONTRAST MRA HEAD WITHOUT CONTRAST TECHNIQUE: Multiplanar, multi-echo pulse sequences of the brain and surrounding structures were acquired without intravenous contrast. Angiographic images of the Circle of  Willis were acquired using MRA technique without intravenous contrast. COMPARISON:  Head CT 05/06/2022 and earlier. FINDINGS: MRI HEAD FINDINGS Brain: Oval and lobulated largely T2 hypointense, T1 I so to slightly hyperintense intra-axial hematoma at the right thalamus measures 15 by 20 x 19 mm (AP by transverse by CC) on MRI for estimated blood volume of 3 mL, not significantly changed since presentation. Surrounding edema tracking from the right thalamus into the posterior right corona radiata, also into the right brainstem. But only mild regional mass effect. No convincing intraventricular blood. No ventriculomegaly. On DWI there is susceptibility associated with the right thalamic blood products, no regional diffusion restriction. And  no restricted diffusion elsewhere. Chronic hemosiderin is present in the low left lateral pons. Faint chronic microhemorrhage also suspected in the lateral left thalamus on series 9, image 17. Also there is heterogeneity in the left deep cerebellar nucleus with suspicion of hemosiderin there, and confluent regional T2 and FLAIR hyperintensity but no regional mass effect (series 8, image 7). This area most resembles encephalomalacia on the previous CT. And some of the T2 and FLAIR heterogeneity in the right brainstem might also be Wallerian degeneration. No other chronic cerebral blood products by T2 *. Basilar cisterns are normal. Scattered mild to moderate for age additional bilateral cerebral white matter T2 and FLAIR hyperintensity. Evidence of multiple small chronic lacunar infarcts in the other deep gray nuclei, including the bilateral lentiform. Cervicomedullary junction and pituitary are within normal limits. Vascular: Major intracranial vascular flow voids are preserved. Skull and upper cervical spine: Negative. Visualized bone marrow signal is within normal limits. Sinuses/Orbits: Globe motion artifact, otherwise negative orbits. Paranasal sinuses and mastoids are stable and well aerated. Other: Negative visible internal auditory structures. Visible scalp and face appear negative. MRA HEAD FINDINGS Anterior circulation: Antegrade flow in both ICA siphons. Mild bilateral siphon irregularity, no significant siphon stenosis. Patent carotid termini, MCA and ACA origins. Allowing for some motion artifact, proximal MCA and ACA branches are within normal limits. Posterior circulation: Antegrade flow in the posterior circulation with codominant distal vertebral arteries. Patent vertebrobasilar junction with no stenosis. Partially visible left PICA and right AICA appear patent. Patent basilar artery without stenosis. Basilar tip and PCA origins are patent. Posterior communicating arteries are diminutive or absent.  Visible bilateral PCA branches are within normal limits when allowing for motion. There is no MRA flow signal identified within the right thalamic hematoma. No MRA evidence of vascular malformation. Anatomic variants: None significant. IMPRESSION: 1. Right thalamic hematoma is stable since 05/05/2022, estimated blood volume of 3 mL. Regional edema but no significant intracranial mass effect. And no IVH, ventriculomegaly or other complicating features. 2. Negative motion degraded intracranial MRA. And evidence of chronic small vessel disease, with other chronic microhemorrhages in the left thalamus, left pons, and also the deep left cerebellum. Suspect some encephalomalacia in the cerebellum, and also some chronic Wallerian degeneration in the right brainstem. 3. No other acute intracranial abnormality. Electronically Signed   By: Genevie Ann M.D.   On: 05/07/2022 13:01    Scheduled Meds:  allopurinol  100 mg Oral Daily   amLODipine  10 mg Oral Daily   atorvastatin  80 mg Oral Daily   carvedilol  25 mg Oral BID WC   Chlorhexidine Gluconate Cloth  6 each Topical Daily   furosemide  40 mg Intravenous Q12H   hydrALAZINE  100 mg Oral Q8H   insulin aspart  0-9 Units Subcutaneous Q4H   pantoprazole  40 mg Oral QHS  QUEtiapine  25 mg Oral QHS   senna-docusate  1 tablet Oral BID   Continuous Infusions:     LOS: 4 days   Time spent: 38mn  Olufemi Mofield C Craig Parson, DO [facility]  If 7PM-7AM, please contact night-coverage [email or web address]  05/09/2022, 7:31 AM

## 2022-05-09 NOTE — Progress Notes (Signed)
Occupational Therapy Treatment Patient Details Name: Craig West MRN: [id number] DOB: 1965-08-14 Today's Date: 05/09/2022   History of present illness Patient is a 57 y/o male who presents on 2/4 with chest pain and left sided weakness as well as facial droop. 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 CXR-pulmonary edema. PMH includes DM (lifestyle control) asthma, HTN, gout, hx tobacco use and current marijuana use.   OT comments  Pt making steady progress toward goals. Brother present for session and educated on treatment strategies/HEP to complete with his brother over the weekend. Focus of session on LUE neuromuscular re-education, using verbal and visual feedback to improve movement patterns. Pt very motivated to "get better" and eager to participate with OT. Continue ot recommend rehab at SNF to maximize functional level of independence.    Recommendations for follow up therapy are one component of a multi-disciplinary discharge planning process, led by the attending physician.  Recommendations may be updated based on patient status, additional functional criteria and insurance authorization.    Follow Up Recommendations  Skilled nursing-short term rehab (<3 hours/day)     Assistance Recommended at Discharge Frequent or constant Supervision/Assistance  Patient can return home with the following  Two people to help with walking and/or transfers;A lot of help with bathing/dressing/bathroom;Assistance with cooking/housework;Help with stairs or ramp for entrance;Assist for transportation   Equipment Recommendations  Other (comment)    Recommendations for Other Services      Precautions / Restrictions Precautions Precautions: Fall Precaution Comments: L sensory motor deficits       Mobility Bed Mobility                    Transfers                         Balance     Sitting balance-Leahy Scale:  Fair                                     ADL either performed or assessed with clinical judgement   ADL   Eating/Feeding: Set up;Bed level       Upper Body Bathing: Minimal assistance                   Toileting- Clothing Manipulation and Hygiene: Total assistance (incontinent of BM)              Extremity/Trunk Assessment Upper Extremity Assessment Upper Extremity Assessment: LUE deficits/detail RUE Deficits / Details: apparent sensory motor deficits due to impared sensation and control; movement improves significnatly when pt is attending to arm LUE Sensation: decreased light touch;decreased proprioception LUE Coordination: decreased fine motor;decreased gross motor   Lower Extremity Assessment Lower Extremity Assessment: Defer to PT evaluation        Vision   Vision Assessment?:  (slow saccades noted; reading funcitonal; ?decreased visual attention)   Perception Perception Perception: Impaired   Praxis Praxis Praxis Impairment Details: Motor planning    Cognition Arousal/Alertness: Awake/alert Behavior During Therapy: Flat affect, Impulsive Overall Cognitive Status: Impaired/Different from baseline Area of Impairment: Safety/judgement, Awareness, Attention                   Current Attention Level: Selective     Safety/Judgement: Decreased awareness of deficits Awareness: Emergent Problem Solving: Slow processing          Exercises  Exercises: Other exercises Other Exercises Other Exercises: focus on using visual and verbal feedback to help iwth coordinated movement patterns; Pt given foam block and foam pieces to work on grip, pinch and motor control. Pt verbally "telling" his arm what to do  - focus on slow and controlled movemetn patterns. repetitive patterns used - movement quality imporved Other Exercises: weight bearing thorugh Lelbow and hand to push up when leaning toard L side to increase proprioceptive input -  brother educated    Shoulder Instructions       General Comments  Large incontinent episode - pt unaware - nsg notifed    Pertinent Vitals/ Pain       Pain Assessment Pain Assessment: No/denies pain  Home Living                                          Prior Functioning/Environment              Frequency  Min 2X/week        Progress Toward Goals  OT Goals(current goals can now be found in the care plan section)  Progress towards OT goals: Progressing toward goals  Acute Rehab OT Goals Patient Stated Goal: To get better OT Goal Formulation: With patient/family Time For Goal Achievement: 05/21/22 Potential to Achieve Goals: Good ADL Goals Pt Will Perform Grooming: with set-up;sitting Pt Will Perform Upper Body Bathing: with set-up;sitting Pt Will Perform Lower Body Bathing: with set-up;sit to/from stand;sitting/lateral leans Pt Will Transfer to Toilet: with min assist;bedside commode;stand pivot transfer Pt/caregiver will Perform Home Exercise Program: Left upper extremity;With theraputty;With Supervision;With written HEP provided  Plan Discharge plan remains appropriate    Co-evaluation                 AM-PAC OT "6 Clicks" Daily Activity     Outcome Measure   Help from another person eating meals?: A Little Help from another person taking care of personal grooming?: A Little Help from another person toileting, which includes using toliet, bedpan, or urinal?: Total Help from another person bathing (including washing, rinsing, drying)?: A Lot Help from another person to put on and taking off regular upper body clothing?: A Lot Help from another person to put on and taking off regular lower body clothing?: Total 6 Click Score: 12    End of Session    OT Visit Diagnosis: Muscle weakness (generalized) (M62.81);Hemiplegia and hemiparesis Hemiplegia - Right/Left: Left Hemiplegia - dominant/non-dominant: Non-Dominant Hemiplegia -  caused by: Other Nontraumatic intracranial hemorrhage   Activity Tolerance Patient tolerated treatment well   Patient Left in bed;with call bell/phone within reach;with bed alarm set;with family/visitor present   Nurse Communication Other (comment) (incontinenet of BM)        Time: 4196-2229 OT Time Calculation (min): 50 min  Charges: OT General Charges $OT Visit: 1 Visit OT Treatments $Self Care/Home Management : 8-22 mins $Neuromuscular Re-education: 23-37 mins  Maurie Boettcher, OT/L   Acute OT Clinical Specialist [place] Pager [phone] Office [phone]   [facility] 05/09/2022, 4:42 PM

## 2022-05-10 DIAGNOSIS — I6389 Other cerebral infarction: Secondary | ICD-10-CM | POA: Diagnosis not present

## 2022-05-10 LAB — GLUCOSE, CAPILLARY
Glucose-Capillary: 108 mg/dL — ABNORMAL HIGH (ref 70–99)
Glucose-Capillary: 154 mg/dL — ABNORMAL HIGH (ref 70–99)
Glucose-Capillary: 186 mg/dL — ABNORMAL HIGH (ref 70–99)
Glucose-Capillary: 188 mg/dL — ABNORMAL HIGH (ref 70–99)
Glucose-Capillary: 197 mg/dL — ABNORMAL HIGH (ref 70–99)
Glucose-Capillary: 219 mg/dL — ABNORMAL HIGH (ref 70–99)

## 2022-05-10 MED ORDER — INSULIN ASPART 100 UNIT/ML IJ SOLN
0.0000 [IU] | Freq: Three times a day (TID) | INTRAMUSCULAR | Status: DC
  Administered 2022-05-10: 2 [IU] via SUBCUTANEOUS
  Administered 2022-05-11: 1 [IU] via SUBCUTANEOUS
  Administered 2022-05-11: 2 [IU] via SUBCUTANEOUS
  Administered 2022-05-11: 1 [IU] via SUBCUTANEOUS
  Administered 2022-05-11 – 2022-05-12 (×2): 2 [IU] via SUBCUTANEOUS
  Administered 2022-05-12 (×2): 1 [IU] via SUBCUTANEOUS
  Administered 2022-05-12 – 2022-05-13 (×2): 3 [IU] via SUBCUTANEOUS
  Administered 2022-05-13: 2 [IU] via SUBCUTANEOUS
  Administered 2022-05-13: 1 [IU] via SUBCUTANEOUS

## 2022-05-10 MED ORDER — MORPHINE SULFATE (PF) 2 MG/ML IV SOLN
2.0000 mg | Freq: Once | INTRAVENOUS | Status: AC
  Administered 2022-05-10: 2 mg via INTRAVENOUS
  Filled 2022-05-10: qty 1

## 2022-05-10 NOTE — Progress Notes (Signed)
Occupational Therapy Treatment Patient Details Name: Craig West MRN: [id number] DOB: [birth year] Today's Date: 05/10/2022   History of present illness Patient is a 57 y/o male who presents on 2/4 with chest pain and left sided weakness as well as facial droop. 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 CXR-pulmonary edema. PMH includes DM (lifestyle control) asthma, HTN, gout, hx tobacco use and current marijuana use.   OT comments  Patient received in supine and agreeable to OT/PT session. Patient able to get to EOB with min assist but was limited by pain when standing. Patient stood to assist with cleaning and transferred to recliner for linen change. In recliner patient was able to perform bathing with patient attempting to use LUE to bathe RUE with assistance to complete.  Patient returned to supine and demonstrated LUE use with picking up foam pieces and placing in cup. Patient demonstrating good gains and will continue to be followed by acute OT to address bathing, dressing, and increasing LUE functional use. Discharge recommendations continue to be appropriate for SNF due to no assistance at home.    Recommendations for follow up therapy are one component of a multi-disciplinary discharge planning process, led by the attending physician.  Recommendations may be updated based on patient status, additional functional criteria and insurance authorization.    Follow Up Recommendations  Skilled nursing-short term rehab (<3 hours/day)     Assistance Recommended at Discharge Frequent or constant Supervision/Assistance  Patient can return home with the following  Two people to help with walking and/or transfers;A lot of help with bathing/dressing/bathroom;Assistance with cooking/housework;Help with stairs or ramp for entrance;Assist for transportation   Equipment Recommendations  Other (comment) (TBD)    Recommendations for Other Services       Precautions / Restrictions Precautions Precautions: Fall Precaution Comments: L sensory motor deficits Restrictions Weight Bearing Restrictions: No       Mobility Bed Mobility Overal bed mobility: Needs Assistance Bed Mobility: Supine to Sit, Sit to Supine     Supine to sit: Min assist Sit to supine: Min assist   General bed mobility comments: requires assist/reminders to guard LUE, minA for LLE back into bed    Transfers Overall transfer level: Needs assistance Equipment used: 2 person hand held assist Transfers: Sit to/from Stand, Bed to chair/wheelchair/BSC Sit to Stand: Mod assist, +2 physical assistance, From elevated surface     Step pivot transfers: Max assist, +2 physical assistance     General transfer comment: mod a +2 to come to stand from slightly elevated EOB x2 for, able to elevate trunk to come to full stand with cues, max a +2 to step pivot to EOB<>chair, cues for hand placement and reaching for EOB on return to bed. limtied by R knee pain     Balance Overall balance assessment: Needs assistance Sitting-balance support: Feet supported Sitting balance-Leahy Scale: Fair Sitting balance - Comments: sitting EOB supervision   Standing balance support: During functional activity, Single extremity supported Standing balance-Leahy Scale: Poor Standing balance comment: standing at EOB. Reliant on therapist's for balance due to decreased LLE sensation and knee buckling. Worsened left lateral lean with prolonged standing.                           ADL either performed or assessed with clinical judgement   ADL Overall ADL's : Needs assistance/impaired         Upper Body Bathing: Minimal assistance;Sitting  Upper Body Bathing Details (indicate cue type and reason): in recliner with assistance for bathing RUE Lower Body Bathing: Moderate assistance;Maximal assistance;Sitting/lateral leans;Sit to/from stand Lower Body Bathing Details (indicate  cue type and reason): patient able to bathe legs while seated and max assist for peri area, unable to reach feet Upper Body Dressing : Minimal assistance;Sitting Upper Body Dressing Details (indicate cue type and reason): donn gown                        Extremity/Trunk Assessment              Vision       Perception     Praxis      Cognition Arousal/Alertness: Awake/alert Behavior During Therapy: Flat affect, Impulsive Overall Cognitive Status: Impaired/Different from baseline Area of Impairment: Safety/judgement, Awareness, Attention                   Current Attention Level: Selective     Safety/Judgement: Decreased awareness of deficits Awareness: Emergent Problem Solving: Slow processing General Comments: limited by knee pain        Exercises      Shoulder Instructions       General Comments      Pertinent Vitals/ Pain       Pain Assessment Pain Assessment: Faces Faces Pain Scale: Hurts even more Pain Location: R knee - pt suspected gout Pain Descriptors / Indicators: Sore, Grimacing, Guarding Pain Intervention(s): Limited activity within patient's tolerance, Monitored during session, Repositioned  Home Living                                          Prior Functioning/Environment              Frequency  Min 2X/week        Progress Toward Goals  OT Goals(current goals can now be found in the care plan section)  Progress towards OT goals: Progressing toward goals  Acute Rehab OT Goals Patient Stated Goal: get better OT Goal Formulation: With patient Time For Goal Achievement: 05/21/22 Potential to Achieve Goals: Good ADL Goals Pt Will Perform Grooming: with set-up;sitting Pt Will Perform Upper Body Bathing: with set-up;sitting Pt Will Perform Lower Body Bathing: with set-up;sit to/from stand;sitting/lateral leans Pt Will Transfer to Toilet: with min assist;bedside commode;stand pivot  transfer Pt/caregiver will Perform Home Exercise Program: Left upper extremity;With theraputty;With Supervision;With written HEP provided  Plan Discharge plan remains appropriate    Co-evaluation    PT/OT/SLP Co-Evaluation/Treatment: Yes Reason for Co-Treatment: To address functional/ADL transfers;For patient/therapist safety   OT goals addressed during session: ADL's and self-care;Strengthening/ROM      AM-PAC OT "6 Clicks" Daily Activity     Outcome Measure   Help from another person eating meals?: A Little Help from another person taking care of personal grooming?: A Little Help from another person toileting, which includes using toliet, bedpan, or urinal?: Total Help from another person bathing (including washing, rinsing, drying)?: A Lot Help from another person to put on and taking off regular upper body clothing?: A Lot Help from another person to put on and taking off regular lower body clothing?: Total 6 Click Score: 12    End of Session Equipment Utilized During Treatment: Gait belt  OT Visit Diagnosis: Muscle weakness (generalized) (M62.81);Hemiplegia and hemiparesis Hemiplegia - Right/Left: Left Hemiplegia - dominant/non-dominant: Non-Dominant Hemiplegia - caused  by: Other Nontraumatic intracranial hemorrhage   Activity Tolerance Patient tolerated treatment well   Patient Left in bed;with call bell/phone within reach;with bed alarm set   Nurse Communication Mobility status        Time: [id number] OT Time Calculation (min): 29 min  Charges: OT General Charges $OT Visit: 1 Visit OT Treatments $Self Care/Home Management : 8-22 mins  Lodema Hong, [place]  Office [place] 05/10/2022, 2:15 PM

## 2022-05-10 NOTE — Progress Notes (Signed)
PROGRESS NOTE    Craig West  [id number] DOB: September 19, 1965 DOA: 05/05/2022 PCP: Charlott Rakes, MD   Brief Narrative:  57 yo PMH DM (lifestyle control) asthma,  HTN HLD gout hx tobacco use and current mj use, presented to ED 2/4 with L sided numbness and weakness over the prior 48h - progressed to a point he couldn't take his home medications. Hypertensive in ED, started on cleviprex gtt - taken to neuro ICU  given hemorrhagic stroke on CT at intake. Thought to be ischemic with conversion. Transferred to hospitalist group 05/08/22  Assessment & Plan:   Principal Problem:   Stroke [facility]) Active Problems:   Hemorrhagic stroke [facility])   Hypertensive emergency   Nontraumatic intracerebral hemorrhage ([place])  Acute right thalamic intraparenchymal hemorrhage Probable right thalamic acute ischemic stroke with hemorrhagic conversion Cerebral edema Repeat MRI/MRA shows right thalamic hematoma stable compared 05/05/22 Continue aggressive blood pressure control, see below Cleviprex weaned off Continue to hold anticoagulation or antiplatelets   Hypertensive emergency leading to intraparenchymal hemorrhage Likely the cause of above Blood pressure finally at goal after 4th medication addition Continue Coreg and amlodipine, hydralazine, and imdur   Acute delirium, resolved Continue delirium precautions Decrease Seroquel p.m. dose, discontinue a.m. dose given increased somnolence in the morning   CKD stage IV Non-anion gap metabolic acidosis, improving Serum creatinine is at baseline, around 3.5 Follow UOP   Prediabetes, well controlled Patient hemoglobin A1c is 5.7 Continue sliding scale insulin with CBG goal 140-180   Chronic HFpEF Without acute exacerbation Repeat echocardiogram showed EF 55 to 60% with grade 1 diastolic dysfunction Continue Lasix/Coreg, not on ACE inhibitor/ARB due to CKD Continue hydralazine, consider Imdur   Chronic anemia of chronic disease  Patient hemoglobin at  baseline, around 10   Probable obstructive sleep apnea Outpatient sleep study recommended per pulmonology  DVT prophylaxis: SCDs only given bleed as above Code Status: Full Family Communication: None present  Status is: Inpatient   Dispo: The patient is from: Home              Anticipated d/c is to: SNF              Anticipated d/c date is: Imminent              Patient currently is medically stable for discharge, awaiting safe disposition at SNF  Consultants:  PCCM  Procedures:  None  Antimicrobials:  None  Subjective: No acute issues or events overnight denies nausea vomiting diarrhea constipation headache fevers chills or chest pain  Objective: Vitals:   05/09/22 2042 05/10/22 0000 05/10/22 0400 05/10/22 0737  BP: 132/74 118/86 132/64 121/78  Pulse: 81 75 68 66  Resp: 16 20 14 15  $ Temp: 99.1 F (37.3 C) 97.6 F (36.4 C) 98.2 F (36.8 C) 97.8 F (36.6 C)  TempSrc: Oral Oral Oral Oral  SpO2: 98% 98% 98% 97%  Weight:      Height:        Intake/Output Summary (Last 24 hours) at 05/10/2022 0740 Last data filed at 05/10/2022 0340 Gross per 24 hour  Intake 1422 ml  Output 3150 ml  Net -1728 ml    Filed Weights   05/05/22 1839 05/06/22 0246 05/08/22 0500  Weight: 102.1 kg 102.1 kg 103.2 kg    Examination:  General:  Pleasantly resting in bed, No acute distress. HEENT:  Normocephalic atraumatic.  Sclerae nonicteric, noninjected.  Extraocular movements intact bilaterally. Neck:  Without mass or deformity.  Trachea is midline. Lungs:  Clear  to auscultate bilaterally without rhonchi, wheeze, or rales. Heart:  Regular rate and rhythm.  Without murmurs, rubs, or gallops. Abdomen:  Soft, nontender, nondistended.  Without guarding or rebound. Extremities: Without cyanosis, clubbing, edema, or obvious deformity. Vascular:  Dorsalis pedis and posterior tibial pulses palpable bilaterally. Skin:  Warm and dry, no erythema, no rashes   Data Reviewed: I have personally  reviewed following labs and imaging studies  CBC: Recent Labs  Lab 05/05/22 1833 05/07/22 0908 05/08/22 0540 05/09/22 0311  WBC 11.5* 10.3 12.2* 12.6*  NEUTROABS  --  8.6* 9.6* 9.7*  HGB 10.5* 10.5* 10.5* 10.8*  HCT 34.9* 34.7* 33.5* 34.0*  MCV 89.5 87.4 85.5 85.0  PLT 256 257 241 [id number]    Basic Metabolic Panel: Recent Labs  Lab 05/05/22 1833 05/06/22 0327 05/07/22 0908 05/08/22 0540 05/09/22 0311  NA 140 139 137 139 135  K 5.4* 4.2 4.3 3.9 4.2  CL 110 108 108 106 103  CO2 19* 21* 24 21* 23  GLUCOSE 101* 133* 199* 116* 119*  BUN 59* 56* 50* 49* 50*  CREATININE 3.14* 3.22* 2.95* 3.26* 3.53*  CALCIUM 7.6* 7.5* 7.4* 7.6* 8.0*  MG  --   --   --   --  1.9    GFR: Estimated Creatinine Clearance: 28.8 mL/min (A) (by C-G formula based on SCr of 3.53 mg/dL (H)). Liver Function Tests: Recent Labs  Lab 05/05/22 1833  AST 31  ALT 43  ALKPHOS 173*  BILITOT 0.5  PROT 7.0  ALBUMIN 3.3*    No results for input(s): "LIPASE", "AMYLASE" in the last 168 hours. No results for input(s): "AMMONIA" in the last 168 hours. Coagulation Profile: Recent Labs  Lab 05/05/22 2244  INR 1.6*    Cardiac Enzymes: No results for input(s): "CKTOTAL", "CKMB", "CKMBINDEX", "TROPONINI" in the last 168 hours. BNP (last 3 results) No results for input(s): "PROBNP" in the last 8760 hours. HbA1C: No results for input(s): "HGBA1C" in the last 72 hours.  CBG: Recent Labs  Lab 05/09/22 1223 05/09/22 1616 05/09/22 2046 05/10/22 0003 05/10/22 0339  GLUCAP 183* 183* 179* 186* 154*    Lipid Profile: No results for input(s): "CHOL", "HDL", "LDLCALC", "TRIG", "CHOLHDL", "LDLDIRECT" in the last 72 hours.  Thyroid Function Tests: No results for input(s): "TSH", "T4TOTAL", "FREET4", "T3FREE", "THYROIDAB" in the last 72 hours. Anemia Panel: No results for input(s): "VITAMINB12", "FOLATE", "FERRITIN", "TIBC", "IRON", "RETICCTPCT" in the last 72 hours.  Sepsis Labs: No results for input(s):  "PROCALCITON", "LATICACIDVEN" in the last 168 hours.  Recent Results (from the past 240 hour(s))  MRSA Next Gen by PCR, Nasal     Status: None   Collection Time: 05/05/22 10:09 PM   Specimen: Nasal Mucosa; Nasal Swab  Result Value Ref Range Status   MRSA by PCR Next Gen NOT DETECTED NOT DETECTED Final    Comment: (NOTE) The GeneXpert MRSA Assay (FDA approved for NASAL specimens only), is one component of a comprehensive MRSA colonization surveillance program. It is not intended to diagnose MRSA infection nor to guide or monitor treatment for MRSA infections. Test performance is not FDA approved in patients less than 33 years old. Performed at [facility], [place] [address], [place], [place] [address]          Radiology Studies: No results found.  Scheduled Meds:  allopurinol  100 mg Oral Daily   amLODipine  10 mg Oral Daily   atorvastatin  80 mg Oral Daily   carvedilol  25 mg Oral  BID WC   Chlorhexidine Gluconate Cloth  6 each Topical Daily   colchicine  0.6 mg Oral BID   furosemide  40 mg Intravenous Q12H   hydrALAZINE  100 mg Oral Q8H   insulin aspart  0-9 Units Subcutaneous Q4H   isosorbide mononitrate  30 mg Oral Daily   pantoprazole  40 mg Oral QHS   QUEtiapine  25 mg Oral QHS   senna-docusate  1 tablet Oral BID   Continuous Infusions:     LOS: 5 days   Time spent: 15mn  Simpson Paulos C Enes Rokosz, DO [facility]  If 7PM-7AM, please contact night-coverage [email or web address]  05/10/2022, 7:40 AM

## 2022-05-10 NOTE — Progress Notes (Signed)
Speech Language Pathology Treatment: Cognitive-Linquistic  Patient Details Name: Craig West MRN: [id number] DOB: 01-23-1966 Today's Date: 05/10/2022 Time: [id number] SLP Time Calculation (min) (ACUTE ONLY): 11 min  Assessment / Plan / Recommendation Clinical Impression  Pt seen for speech and cognitive therapy with brother at bedside and has made significant improvements from evaluation. Unlike on initial assessment, pt was alert, conversive and able to adequately state thoughts today. He is oriented x 4 and able to provide intellectual awareness of his deficits stating he will go to a rehab center for more therapy to increase left side "mobility and strength." He demonstrated 100% accuracy for various cognitive activities re: reading, attention and mental math. Processing was swift and his speech was 100% intelligible today with both pt and brother stating it now "sounds the same.". He is functional for the acute care venue and will discharge [facility] services.    HPI HPI: Patient is a 57 y/o male who presents on 2/4 with chest pain and left sided weakness as well as facial droop. 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 CXR-pulmonary edema. PMH includes DM (lifestyle control) asthma, HTN, gout, hx tobacco use and current marijuana use.      SLP Plan  All goals met;Discharge SLP treatment due to (comment)      Recommendations for follow up therapy are one component of a multi-disciplinary discharge planning process, led by the attending physician.  Recommendations may be updated based on patient status, additional functional criteria and insurance authorization.    Recommendations                   Follow Up Recommendations: Skilled nursing-short term rehab (<3 hours/day) Assistance recommended at discharge: Set up Supervision/Assistance SLP Visit Diagnosis: Cognitive communication deficit (R41.841) Plan: All goals met;Discharge  SLP treatment due to (comment)           Houston Siren  05/10/2022, 4:04 PM

## 2022-05-10 NOTE — Plan of Care (Signed)
  Problem: Education: Goal: Knowledge of disease or condition will improve Outcome: Progressing Goal: Knowledge of secondary prevention will improve (MUST DOCUMENT ALL) Outcome: Progressing Goal: Knowledge of patient specific risk factors will improve [place] N/A or DELETE if not current risk factor) Outcome: Progressing   Problem: Intracerebral Hemorrhage Tissue Perfusion: Goal: Complications of Intracerebral Hemorrhage will be minimized Outcome: Progressing   Problem: Coping: Goal: Will verbalize positive feelings about self Outcome: Progressing Goal: Will identify appropriate support needs Outcome: Progressing   Problem: Health Behavior/Discharge Planning: Goal: Ability to manage health-related needs will improve Outcome: Progressing Goal: Goals will be collaboratively established with patient/family Outcome: Progressing   Problem: Self-Care: Goal: Ability to participate in self-care as condition permits will improve Outcome: Progressing Goal: Verbalization of feelings and concerns over difficulty with self-care will improve Outcome: Progressing Goal: Ability to communicate needs accurately will improve Outcome: Progressing   Problem: Nutrition: Goal: Risk of aspiration will decrease Outcome: Progressing Goal: Dietary intake will improve Outcome: Progressing   Problem: Education: Goal: Ability to describe self-care measures that may prevent or decrease complications (Diabetes Survival Skills Education) will improve Outcome: Progressing Goal: Individualized Educational Video(s) Outcome: Progressing   Problem: Coping: Goal: Ability to adjust to condition or change in health will improve Outcome: Progressing   Problem: Fluid Volume: Goal: Ability to maintain a balanced intake and output will improve Outcome: Progressing   Problem: Health Behavior/Discharge Planning: Goal: Ability to identify and utilize available resources and services will improve Outcome:  Progressing Goal: Ability to manage health-related needs will improve Outcome: Progressing   Problem: Metabolic: Goal: Ability to maintain appropriate glucose levels will improve Outcome: Progressing   Problem: Nutritional: Goal: Maintenance of adequate nutrition will improve Outcome: Progressing Goal: Progress toward achieving an optimal weight will improve Outcome: Progressing   Problem: Skin Integrity: Goal: Risk for impaired skin integrity will decrease Outcome: Progressing   Problem: Tissue Perfusion: Goal: Adequacy of tissue perfusion will improve Outcome: Progressing   Problem: Education: Goal: Knowledge of General Education information will improve Description: Including pain rating scale, medication(s)/side effects and non-pharmacologic comfort measures Outcome: Progressing   Problem: Health Behavior/Discharge Planning: Goal: Ability to manage health-related needs will improve Outcome: Progressing   Problem: Clinical Measurements: Goal: Ability to maintain clinical measurements within normal limits will improve Outcome: Progressing Goal: Will remain free from infection Outcome: Progressing Goal: Diagnostic test results will improve Outcome: Progressing Goal: Respiratory complications will improve Outcome: Progressing Goal: Cardiovascular complication will be avoided Outcome: Progressing   Problem: Activity: Goal: Risk for activity intolerance will decrease Outcome: Progressing   Problem: Nutrition: Goal: Adequate nutrition will be maintained Outcome: Progressing   Problem: Coping: Goal: Level of anxiety will decrease Outcome: Progressing   Problem: Elimination: Goal: Will not experience complications related to bowel motility Outcome: Progressing Goal: Will not experience complications related to urinary retention Outcome: Progressing   Problem: Pain Managment: Goal: General experience of comfort will improve Outcome: Progressing   Problem:  Safety: Goal: Ability to remain free from injury will improve Outcome: Progressing   Problem: Skin Integrity: Goal: Risk for impaired skin integrity will decrease Outcome: Progressing

## 2022-05-10 NOTE — TOC Initial Note (Signed)
Transition of Care [facility]) - Initial/Assessment Note    Patient Details  Name: Craig West MRN: [id number] Date of Birth: [birth year]  [facility]) CM/SW Contact:    Geralynn Ochs, LCSW Phone Number: 05/10/2022, 3:06 PM  Clinical Narrative:        CSW noting recommendation for SNF placement, patient in agreement. CSW completed referral, faxed out. No bed offers at this time.           Expected Discharge Plan: Skilled Nursing Facility Barriers to Discharge: Insurance Authorization   Patient Goals and CMS Choice Patient states their goals for this hospitalization and ongoing recovery are:: to get rehab CMS Medicare.gov Compare Post Acute Care list provided to:: Patient Choice offered to / list presented to : Patient [place] ownership interest in [facility].provided to:: Patient    Expected Discharge Plan and Services     Post Acute Care Choice: [place] Living arrangements for the past 2 months: Single Family Home                                      Prior Living Arrangements/Services Living arrangements for the past 2 months: Single Family Home Lives with:: Self Patient language and need for interpreter reviewed:: No Do you feel safe going back to the place where you live?: Yes      Need for Family Participation in Patient Care: No (Comment) Care giver support system in place?: No (comment)   Criminal Activity/Legal Involvement Pertinent to Current Situation/Hospitalization: No - Comment as needed  Activities of Daily Living Home Assistive Devices/Equipment: None ADL Screening (condition at time of admission) Patient's cognitive ability adequate to safely complete daily activities?: Yes Is the patient deaf or have difficulty hearing?: No Does the patient have difficulty seeing, even when wearing glasses/contacts?: No Does the patient have difficulty concentrating, remembering, or making decisions?: No Patient able to  express need for assistance with ADLs?: Yes Does the patient have difficulty dressing or bathing?: Yes Independently performs ADLs?: Yes (appropriate for developmental age) Does the patient have difficulty walking or climbing stairs?: Yes Weakness of Legs: Left Weakness of Arms/Hands: Left  Permission Sought/Granted Permission sought to share information with : Facility Art therapist granted to share information with : Yes, Verbal Permission Granted     Permission granted to share info w AGENCY: SNF        Emotional Assessment Appearance:: Appears stated age Attitude/Demeanor/Rapport: Engaged Affect (typically observed): Appropriate Orientation: : Oriented to Self, Oriented to Place, Oriented to  Time, Oriented to Situation Alcohol / Substance Use: Not Applicable Psych Involvement: No (comment)  Admission diagnosis:  Hemorrhagic stroke ([place]) [I61.9] Stroke [facility]) [I63.9] Hypertensive emergency [I16.1] Chest pain, unspecified type [R07.9] Nontraumatic intracerebral hemorrhage, unspecified cerebral location, unspecified laterality [facility]) [I61.9] Patient Active Problem List   Diagnosis Date Noted   Stroke ([place]) 05/05/2022   Hemorrhagic stroke ([place]) 05/05/2022   Hypertensive emergency 05/05/2022   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   BPH (benign prostatic hyperplasia) 03/27/2021   Fatigue 11/23/2020   Leg heaviness 11/23/2020   NSVT (nonsustained ventricular tachycardia) ([place]) 11/23/2020   Diabetes mellitus without complication ([place])    CKD (chronic kidney disease), stage IV ([place])    PICC (peripherally inserted central catheter) in place 03/27/2020   Rectal cancer ([place]) 01/27/2020   Benign neoplasm of ascending colon    Benign neoplasm of descending colon  Benign neoplasm of sigmoid colon    Rectal mass    Rectal bleeding    Iron deficiency anemia    Chronic diastolic heart failure (HCC)    Hyperkalemia    Volume overload  01/06/2020   Demand ischemia    COVID-19 virus infection 04/2019   Obesity (BMI 30.0-34.9) 12/09/2017   Non compliance w medication regimen 04/18/2017   Gout 08/02/2016   Diabetic neuropathy ([place]) 08/02/2016   Hyperlipidemia 10/24/2015   Asthma 10/16/2015   Type 2 diabetes mellitus without complication ([place]) [id number]   Essential hypertension 06/13/2014   Smoker 06/13/2014   PCP:  Charlott Rakes, MD Pharmacy:   [facility] [address], [place] [address] Phone: [phone] Fax: [phone]  [facility] [place], [place] [address] Phone: [phone] Fax: [phone]  [place] [address] Phone: [phone] Fax: [phone]  [place]), [place] - [place] [id number]  ([place]) [place] [address] Phone: [phone] Fax: [place] [address], [place] [address] Phone: [phone] Fax: [phone]     Social Determinants of Health (SDOH) Social History: SDOH Screenings   Food Insecurity: No Food Insecurity (05/09/2022)  Housing: Low Risk  (05/09/2022)  Transportation Needs: No Transportation Needs (05/09/2022)  Utilities: Not At Risk (05/09/2022)  Alcohol Screen: Low Risk  (01/06/2020)  Depression (PHQ2-9): Low Risk  (11/06/2021)  Financial Resource Strain: High Risk (06/12/2021)  Physical Activity: Inactive (06/12/2021)  Social Connections: Socially Isolated (06/12/2021)  Stress: Stress Concern Present (06/12/2021)  Tobacco Use: Medium Risk (05/09/2022)   SDOH Interventions:     Readmission Risk Interventions     No data to display

## 2022-05-10 NOTE — TOC Progression Note (Signed)
Transition of Care [facility]) - Progression Note    Patient Details  Name: Craig West MRN: [id number] Date of Birth: [birth year]  Transition of Care [facility]) CM/SW [place], [place] Phone Number: 05/10/2022, 3:06 PM  Clinical Narrative:   Patient has received no bed offers as of yet. CSW faxed referral out further. CSW to follow.    Expected Discharge Plan: Skilled Nursing Facility Barriers to Discharge: Insurance Authorization  Expected Discharge Plan and [place] Choice: [place] Living arrangements for the past 2 months: Single Family Home                                       Social Determinants of Health (SDOH) Interventions SDOH Screenings   Food Insecurity: No Food Insecurity (05/09/2022)  Housing: Low Risk  (05/09/2022)  Transportation Needs: No Transportation Needs (05/09/2022)  Utilities: Not At Risk (05/09/2022)  Alcohol Screen: Low Risk  (01/06/2020)  Depression (PHQ2-9): Low Risk  (11/06/2021)  Financial Resource Strain: High Risk (06/12/2021)  Physical Activity: Inactive (06/12/2021)  Social Connections: Socially Isolated (06/12/2021)  Stress: Stress Concern Present (06/12/2021)  Tobacco Use: Medium Risk (05/09/2022)    Readmission Risk Interventions     No data to display

## 2022-05-10 NOTE — Progress Notes (Signed)
Physical Therapy Treatment Patient Details Name: Craig West MRN: [id number] DOB: [birth year] Today's Date: 05/10/2022   History of Present Illness Patient is a 57 y/o male who presents on 2/4 with chest pain and left sided weakness as well as facial droop. 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 CXR-pulmonary edema. PMH includes DM (lifestyle control) asthma, HTN, gout, hx tobacco use and current marijuana use.    PT Comments    Pt greeted supine in bed and agreeable to PT/OT session with focus on bed mobility and functional transfers for increased activity tolerance and LE strength. Pt limited this date secondary to increased R knee pain, pt suspecting gout flair, RN notified and aware. Pt able to come to stand x3 trials throughout session with mod a +2, pt demonstrating anterior flexion throughout, with ability to correct with multimodal cues. Pt able to step pivot EOB<>chair with max a +2 with poor clearance of bil feet noted. Pt contineus to remain motivated and participatory and current plan remains appropriate to address deficits and maximize functional independence and decrease caregiver burden. Pt continues to benefit from skilled PT services to progress toward functional mobility goals.    Recommendations for follow up therapy are one component of a multi-disciplinary discharge planning process, led by the attending physician.  Recommendations may be updated based on patient status, additional functional criteria and insurance authorization.  Follow Up Recommendations  Skilled nursing-short term rehab (<3 hours/day) Can patient physically be transported by private vehicle: No   Assistance Recommended at Discharge Frequent or constant Supervision/Assistance  Patient can return home with the following Two people to help with walking and/or transfers;Assistance with cooking/housework;Assist for transportation;Help with stairs or  ramp for entrance   Equipment Recommendations  Other (comment) (defer to next level of care)    Recommendations for Other Services       Precautions / Restrictions Precautions Precautions: Fall Precaution Comments: L sensory motor deficits Restrictions Weight Bearing Restrictions: No     Mobility  Bed Mobility Overal bed mobility: Needs Assistance Bed Mobility: Supine to Sit, Sit to Supine     Supine to sit: Min assist Sit to supine: Min assist   General bed mobility comments: requires assist/reminders to guard LUE, minA for LLE back into bed    Transfers Overall transfer level: Needs assistance Equipment used: 2 person hand held assist Transfers: Sit to/from Stand, Bed to chair/wheelchair/BSC Sit to Stand: Mod assist, +2 physical assistance, From elevated surface   Step pivot transfers: Max assist, +2 physical assistance       General transfer comment: mod a +2 to come to stand from slightly elevated EOB x2 for, able to elevate trunk to come to full stand with cues, max a +2 to step pivot to EOB<>chair, cues for hand placement and reaching for EOB on return to bed. limtied by R knee pain    Ambulation/Gait               General Gait Details: unable this date secondary to pain   Stairs             Wheelchair Mobility    Modified Rankin (Stroke Patients Only) Modified Rankin (Stroke Patients Only) Pre-Morbid Rankin Score: No symptoms Modified Rankin: Moderately severe disability     Balance Overall balance assessment: Needs assistance Sitting-balance support: Feet supported Sitting balance-Leahy Scale: Fair Sitting balance - Comments: sitting EOB supervision   Standing balance support: During functional activity, Single extremity supported Standing  balance-Leahy Scale: Poor Standing balance comment: standing at EOB. Reliant on therapist's for balance due to decreased LLE sensation and knee buckling. Worsened left lateral lean with prolonged  standing.                            Cognition Arousal/Alertness: Awake/alert Behavior During Therapy: Flat affect, Impulsive Overall Cognitive Status: Impaired/Different from baseline Area of Impairment: Safety/judgement, Awareness, Attention                   Current Attention Level: Selective     Safety/Judgement: Decreased awareness of deficits Awareness: Emergent Problem Solving: Slow processing General Comments: pt internally distracted bt R knee pain/ pt suspected gout flair.        Exercises      General Comments General comments (skin integrity, edema, etc.): VSS on RA      Pertinent Vitals/Pain Pain Assessment Pain Assessment: Faces Faces Pain Scale: Hurts even more Pain Location: R knee - pt suspected gout Pain Descriptors / Indicators: Sore, Grimacing, Guarding Pain Intervention(s): Monitored during session, Limited activity within patient's tolerance, Repositioned, Patient requesting pain meds-RN notified    Home Living                          Prior Function            PT Goals (current goals can now be found in the care plan section) Acute Rehab PT Goals PT Goal Formulation: With patient Time For Goal Achievement: 05/21/22 Progress towards PT goals: Progressing toward goals    Frequency    Min 3X/week      PT Plan Current plan remains appropriate    Co-evaluation PT/OT/SLP Co-Evaluation/Treatment: Yes Reason for Co-Treatment: To address functional/ADL transfers;For patient/therapist safety PT goals addressed during session: Mobility/safety with mobility;Balance        AM-PAC PT "6 Clicks" Mobility   Outcome Measure  Help needed turning from your back to your side while in a flat bed without using bedrails?: A Little Help needed moving from lying on your back to sitting on the side of a flat bed without using bedrails?: A Little Help needed moving to and from a bed to a chair (including a wheelchair)?:  Total Help needed standing up from a chair using your arms (e.g., wheelchair or bedside chair)?: Total Help needed to walk in hospital room?: Total Help needed climbing 3-5 steps with a railing? : Total 6 Click Score: 10    End of Session Equipment Utilized During Treatment: Gait belt Activity Tolerance: Patient tolerated treatment well Patient left: in bed;with call bell/phone within reach;with bed alarm set Nurse Communication: Mobility status;Patient requests pain meds PT Visit Diagnosis: Hemiplegia and hemiparesis;Difficulty in walking, not elsewhere classified (R26.2);Unsteadiness on feet (R26.81) Hemiplegia - Right/Left: Left Hemiplegia - dominant/non-dominant: Non-dominant Hemiplegia - caused by: Cerebral infarction     Time: [id number] PT Time Calculation (min) (ACUTE ONLY): 29 min  Charges:  $Therapeutic Activity: 8-22 mins                     Farron Watrous R. PTA Acute Rehabilitation Services Office: [place] 05/10/2022, 11:06 AM

## 2022-05-10 NOTE — TOC Progression Note (Signed)
Transition of Care [facility]) - Progression Note    Patient Details  Name: Craig West MRN: [id number] Date of Birth: [birth year]  Transition of Care [facility]) CM/SW [place], [place] Phone Number: 05/10/2022, 3:07 PM  Clinical Narrative:   CSW met with patient this morning to provide bed offers. Patient chose [place]. CSW attempted to reach Admissions at [facility], left a couple of voicemails asking for a call back. Awaiting call back at this time.     Expected Discharge Plan: Skilled Nursing Facility Barriers to Discharge: Insurance Authorization  Expected Discharge Plan and [place] Choice: [place] Living arrangements for the past 2 months: Single Family Home                                       Social Determinants of Health (SDOH) Interventions SDOH Screenings   Food Insecurity: No Food Insecurity (05/09/2022)  Housing: Low Risk  (05/09/2022)  Transportation Needs: No Transportation Needs (05/09/2022)  Utilities: Not At Risk (05/09/2022)  Alcohol Screen: Low Risk  (01/06/2020)  Depression (PHQ2-9): Low Risk  (11/06/2021)  Financial Resource Strain: High Risk (06/12/2021)  Physical Activity: Inactive (06/12/2021)  Social Connections: Socially Isolated (06/12/2021)  Stress: Stress Concern Present (06/12/2021)  Tobacco Use: Medium Risk (05/09/2022)    Readmission Risk Interventions     No data to display

## 2022-05-11 DIAGNOSIS — I6389 Other cerebral infarction: Secondary | ICD-10-CM | POA: Diagnosis not present

## 2022-05-11 LAB — GLUCOSE, CAPILLARY
Glucose-Capillary: 144 mg/dL — ABNORMAL HIGH (ref 70–99)
Glucose-Capillary: 191 mg/dL — ABNORMAL HIGH (ref 70–99)
Glucose-Capillary: 125 mg/dL — ABNORMAL HIGH (ref 70–99)
Glucose-Capillary: 175 mg/dL — ABNORMAL HIGH (ref 70–99)

## 2022-05-11 IMAGING — DX DG CHEST 1V PORT
1 series · 2 of 2 positions shown · non-contrast
Comparison: 01/06/2020

CLINICAL DATA: Evaluate for pneumoperitoneum

EXAM:
PORTABLE CHEST 1 VIEW

[Series 1: chest ap · 0.14mm/px · 2 of 2 slices shown]
[im 1/2]
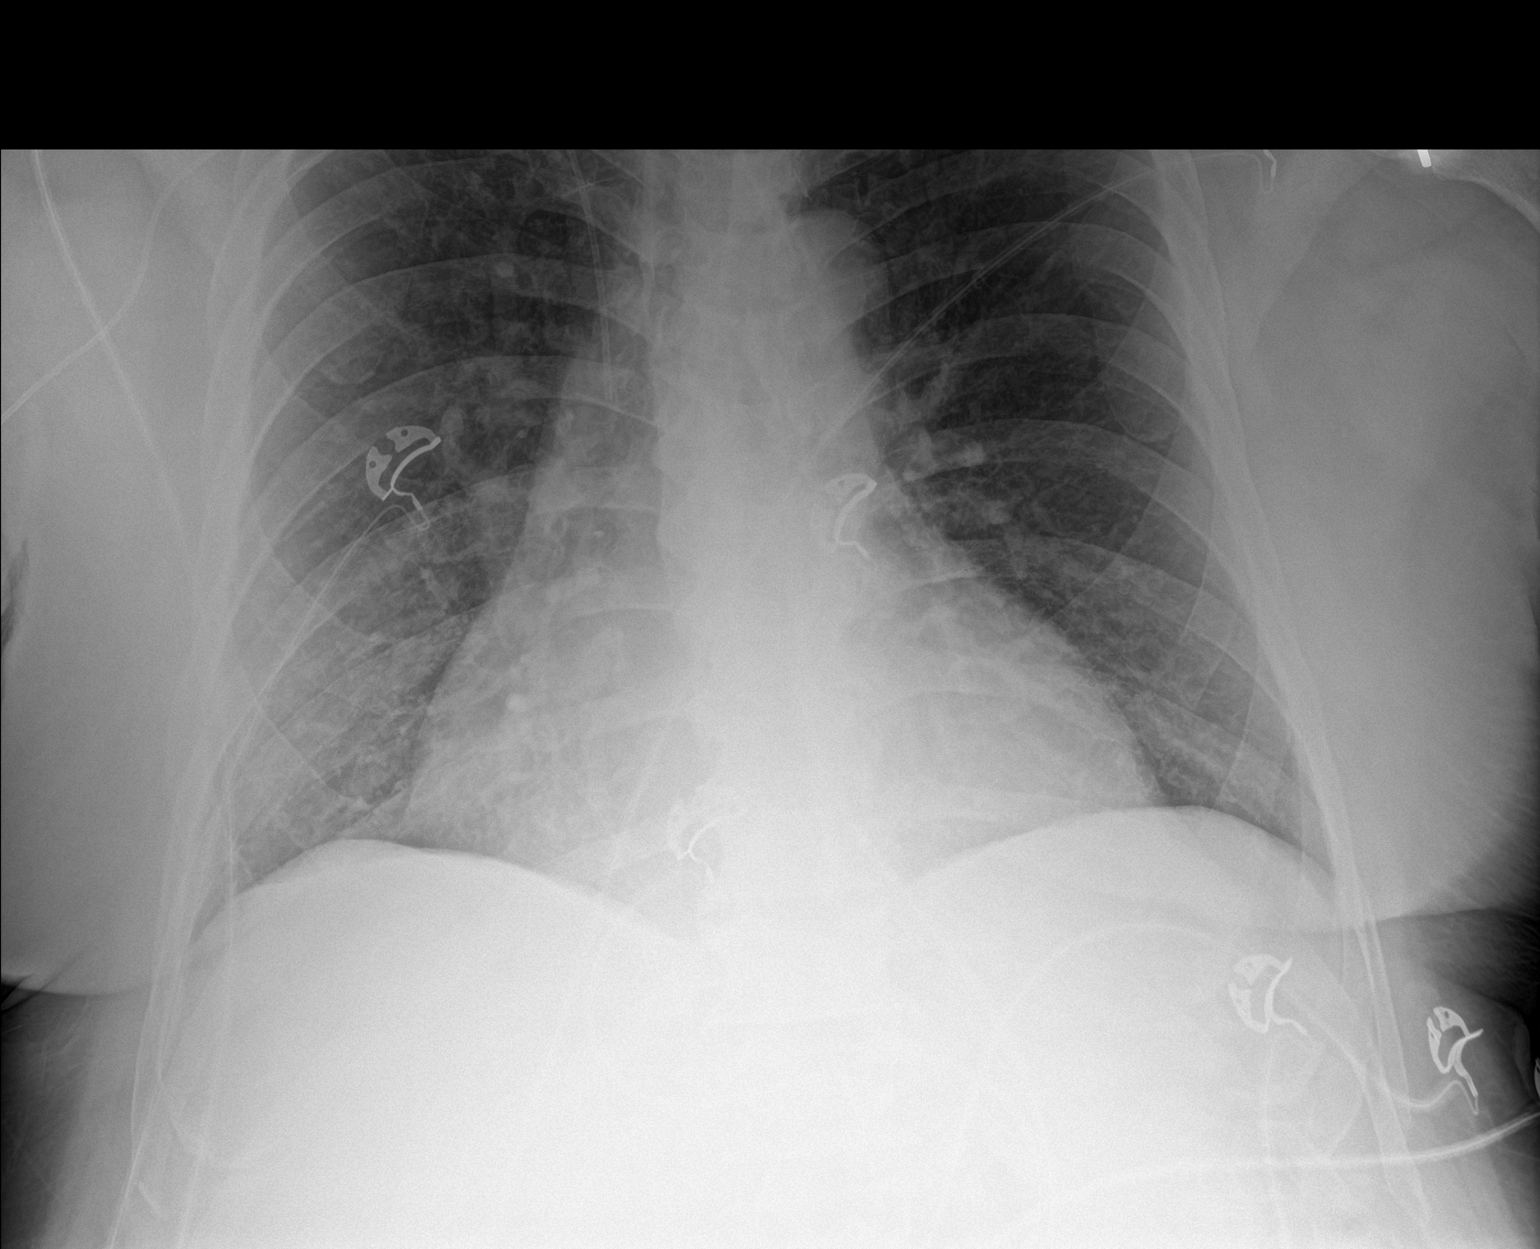
[im 2/2]
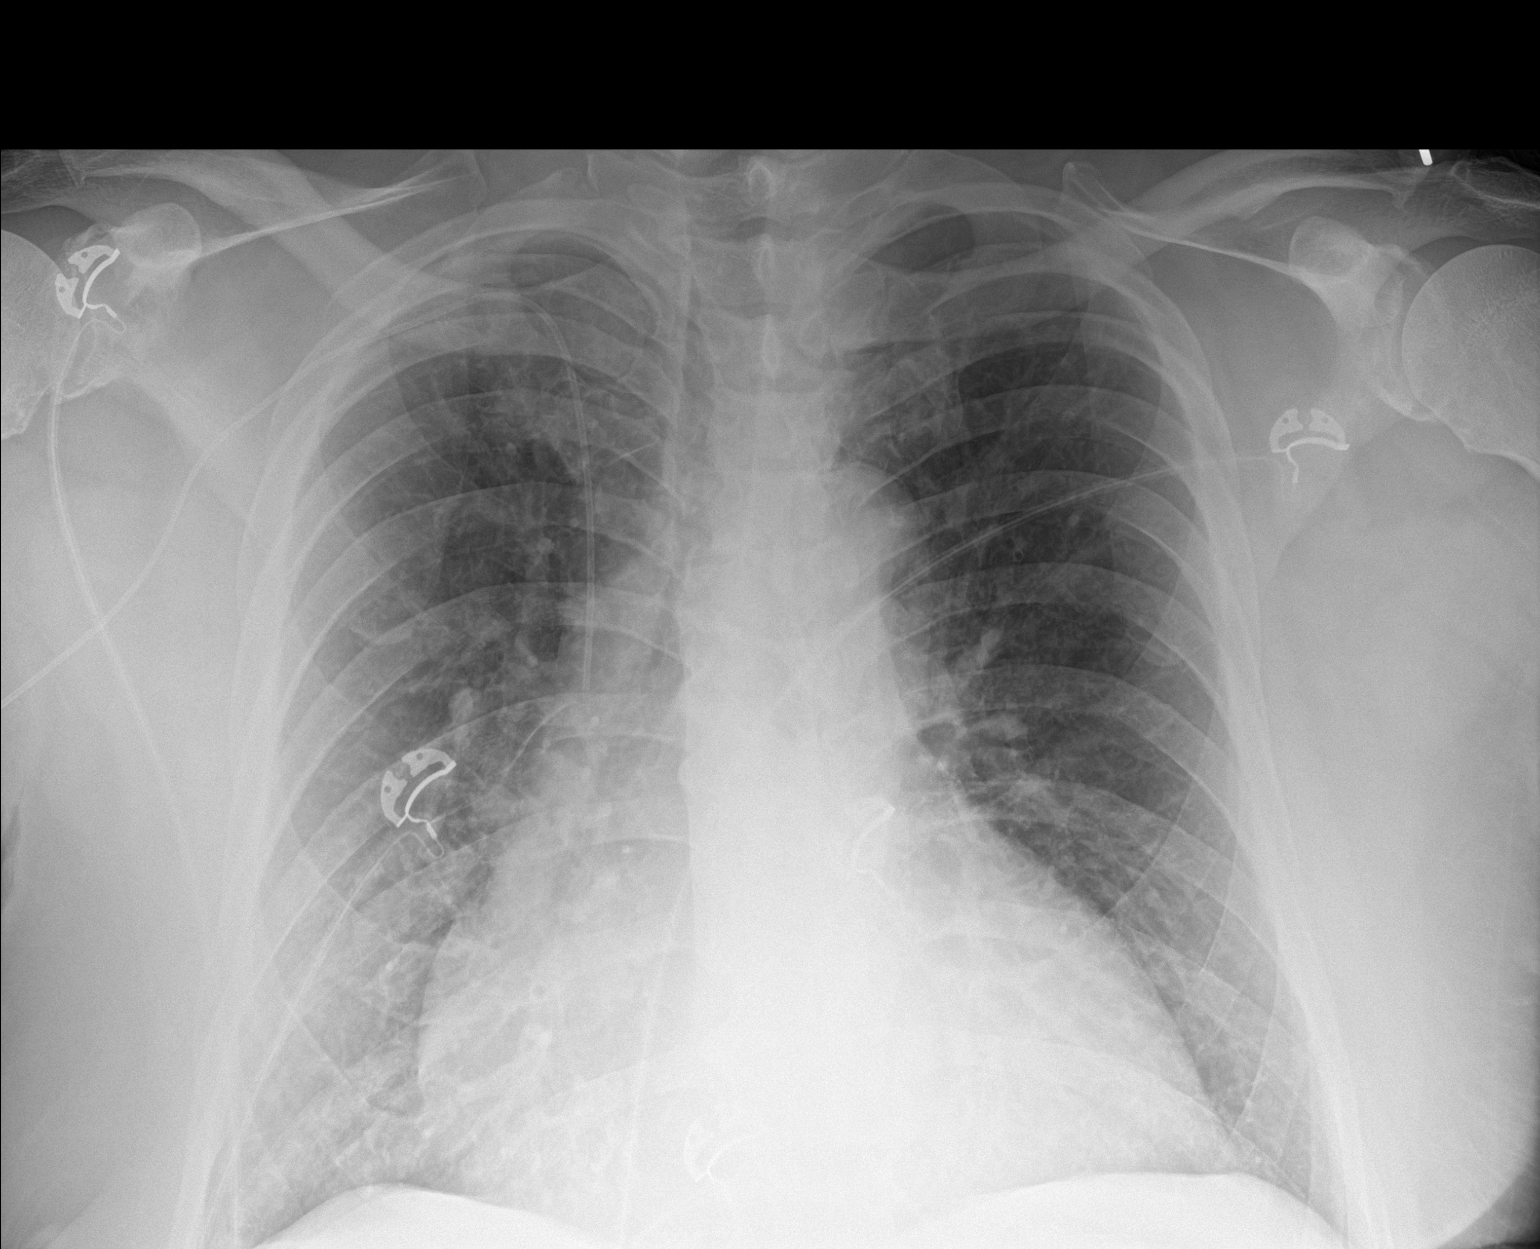

[2 of 2 positions shown; findings below may reference images not displayed]

FINDINGS: Cardiomegaly. Pulmonary vascular prominence without overt pulmonary
edema. No acute appearing airspace opacity. Right upper extremity
PICC is positioned with tip over the lower superior vena cava. No
free air below the diaphragms.
IMPRESSION: 1. Cardiomegaly. Pulmonary vascular prominence without overt
pulmonary edema. No acute appearing airspace opacity.

2. Right upper extremity PICC is positioned with tip over the lower
superior vena cava.

3.  No free air below the diaphragms per stated indication.

## 2022-05-11 MED ORDER — POLYETHYLENE GLYCOL 3350 17 G PO PACK
17.0000 g | PACK | Freq: Every day | ORAL | Status: DC
  Administered 2022-05-11 – 2022-05-12 (×2): 17 g via ORAL
  Filled 2022-05-11 (×2): qty 1

## 2022-05-11 NOTE — Progress Notes (Signed)
PROGRESS NOTE    Craig West  [id number] DOB: 05/16/1965 DOA: 05/05/2022 PCP: Charlott Rakes, MD   Brief Narrative:  57 yo PMH DM (lifestyle control) asthma,  HTN HLD gout hx tobacco use and current mj use, presented to ED 2/4 with L sided numbness and weakness over the prior 48h - progressed to a point he couldn't take his home medications. Hypertensive in ED, started on cleviprex gtt - taken to neuro ICU  given hemorrhagic stroke on CT at intake. Thought to be ischemic with conversion. Transferred to hospitalist group 05/08/22  Assessment & Plan:   Principal Problem:   Stroke [facility]) Active Problems:   Hemorrhagic stroke [facility])   Hypertensive emergency   Nontraumatic intracerebral hemorrhage ([place])  Acute right thalamic intraparenchymal hemorrhage Probable right thalamic acute ischemic stroke with hemorrhagic conversion Cerebral edema Repeat MRI/MRA shows right thalamic hematoma stable compared 05/05/22 Continue aggressive blood pressure control, see below Cleviprex weaned off Continue to hold anticoagulation or antiplatelets   Hypertensive emergency leading to intraparenchymal hemorrhage Likely the cause of above Blood pressure finally at goal after 4th medication addition Continue Coreg and amlodipine, hydralazine, and imdur   Acute delirium, resolved Continue delirium precautions Decrease Seroquel p.m. dose, discontinue a.m. dose given increased somnolence in the morning   CKD stage IV Non-anion gap metabolic acidosis, improving Serum creatinine is at baseline, around 3.5 Follow UOP   Prediabetes, well controlled Patient hemoglobin A1c is 5.7 Continue sliding scale insulin with CBG goal 140-180   Chronic HFpEF Without acute exacerbation Repeat echocardiogram showed EF 55 to 60% with grade 1 diastolic dysfunction Continue Lasix/Coreg, not on ACE inhibitor/ARB due to CKD Continue hydralazine, consider Imdur   Chronic anemia of chronic disease  Hemorrhoidal bleed,  resolved Patient hemoglobin at baseline, around 10 Scant BRBPR today with BM - patient notes hard stool are chronic - start stool softener   Probable obstructive sleep apnea Outpatient sleep study recommended per pulmonology  DVT prophylaxis: SCDs only given bleed as above Code Status: Full Family Communication: None present  Status is: Inpatient   Dispo: The patient is from: Home              Anticipated d/c is to: SNF              Anticipated d/c date is: Imminent              Patient currently is medically stable for discharge, awaiting safe disposition at SNF  Consultants:  PCCM  Procedures:  None  Antimicrobials:  None  Subjective: No acute issues or events overnight denies nausea vomiting diarrhea constipation headache fevers chills or chest pain  Objective: Vitals:   05/10/22 1949 05/10/22 1949 05/10/22 2348 05/11/22 0435  BP: (!) 150/71 (!) 150/81 (!) 146/86 (!) 165/108  Pulse:  79 75 79  Resp: 20 20 16 18  $ Temp: 98 F (36.7 C) 98 F (36.7 C) 98.2 F (36.8 C) 98 F (36.7 C)  TempSrc: Oral Oral Axillary Axillary  SpO2: 97% 97% 100% 98%  Weight:      Height:        Intake/Output Summary (Last 24 hours) at 05/11/2022 0742 Last data filed at 05/11/2022 0500 Gross per 24 hour  Intake --  Output 2250 ml  Net -2250 ml    Filed Weights   05/05/22 1839 05/06/22 0246 05/08/22 0500  Weight: 102.1 kg 102.1 kg 103.2 kg    Examination:  General:  Pleasantly resting in bed, No acute distress. HEENT:  Normocephalic atraumatic.  Sclerae nonicteric, noninjected.  Extraocular movements intact bilaterally. Neck:  Without mass or deformity.  Trachea is midline. Lungs:  Clear to auscultate bilaterally without rhonchi, wheeze, or rales. Heart:  Regular rate and rhythm.  Without murmurs, rubs, or gallops. Abdomen:  Soft, nontender, nondistended.  Without guarding or rebound. Extremities: Without cyanosis, clubbing, edema, or obvious deformity. Vascular:  Dorsalis  pedis and posterior tibial pulses palpable bilaterally. Skin:  Warm and dry, no erythema, no rashes   Data Reviewed: I have personally reviewed following labs and imaging studies  CBC: Recent Labs  Lab 05/05/22 1833 05/07/22 0908 05/08/22 0540 05/09/22 0311  WBC 11.5* 10.3 12.2* 12.6*  NEUTROABS  --  8.6* 9.6* 9.7*  HGB 10.5* 10.5* 10.5* 10.8*  HCT 34.9* 34.7* 33.5* 34.0*  MCV 89.5 87.4 85.5 85.0  PLT 256 257 241 [id number]    Basic Metabolic Panel: Recent Labs  Lab 05/05/22 1833 05/06/22 0327 05/07/22 0908 05/08/22 0540 05/09/22 0311  NA 140 139 137 139 135  K 5.4* 4.2 4.3 3.9 4.2  CL 110 108 108 106 103  CO2 19* 21* 24 21* 23  GLUCOSE 101* 133* 199* 116* 119*  BUN 59* 56* 50* 49* 50*  CREATININE 3.14* 3.22* 2.95* 3.26* 3.53*  CALCIUM 7.6* 7.5* 7.4* 7.6* 8.0*  MG  --   --   --   --  1.9    GFR: Estimated Creatinine Clearance: 28.8 mL/min (A) (by C-G formula based on SCr of 3.53 mg/dL (H)). Liver Function Tests: Recent Labs  Lab 05/05/22 1833  AST 31  ALT 43  ALKPHOS 173*  BILITOT 0.5  PROT 7.0  ALBUMIN 3.3*    No results for input(s): "LIPASE", "AMYLASE" in the last 168 hours. No results for input(s): "AMMONIA" in the last 168 hours. Coagulation Profile: Recent Labs  Lab 05/05/22 2244  INR 1.6*    Cardiac Enzymes: No results for input(s): "CKTOTAL", "CKMB", "CKMBINDEX", "TROPONINI" in the last 168 hours. BNP (last 3 results) No results for input(s): "PROBNP" in the last 8760 hours. HbA1C: No results for input(s): "HGBA1C" in the last 72 hours.  CBG: Recent Labs  Lab 05/10/22 0804 05/10/22 1157 05/10/22 1952 05/10/22 2110 05/11/22 0615  GLUCAP 108* 219* 197* 188* 144*    Lipid Profile: No results for input(s): "CHOL", "HDL", "LDLCALC", "TRIG", "CHOLHDL", "LDLDIRECT" in the last 72 hours.  Thyroid Function Tests: No results for input(s): "TSH", "T4TOTAL", "FREET4", "T3FREE", "THYROIDAB" in the last 72 hours. Anemia Panel: No results for  input(s): "VITAMINB12", "FOLATE", "FERRITIN", "TIBC", "IRON", "RETICCTPCT" in the last 72 hours.  Sepsis Labs: No results for input(s): "PROCALCITON", "LATICACIDVEN" in the last 168 hours.  Recent Results (from the past 240 hour(s))  MRSA Next Gen by PCR, Nasal     Status: None   Collection Time: 05/05/22 10:09 PM   Specimen: Nasal Mucosa; Nasal Swab  Result Value Ref Range Status   MRSA by PCR Next Gen NOT DETECTED NOT DETECTED Final    Comment: (NOTE) The GeneXpert MRSA Assay (FDA approved for NASAL specimens only), is one component of a comprehensive MRSA colonization surveillance program. It is not intended to diagnose MRSA infection nor to guide or monitor treatment for MRSA infections. Test performance is not FDA approved in patients less than 75 years old. Performed at [facility], [place] [address], [place], [place] [address]          Radiology Studies: No results found.  Scheduled Meds:  allopurinol  100 mg Oral Daily  amLODipine  10 mg Oral Daily   atorvastatin  80 mg Oral Daily   carvedilol  25 mg Oral BID WC   Chlorhexidine Gluconate Cloth  6 each Topical Daily   colchicine  0.6 mg Oral BID   furosemide  40 mg Intravenous Q12H   hydrALAZINE  100 mg Oral Q8H   insulin aspart  0-9 Units Subcutaneous TID AC & HS   isosorbide mononitrate  30 mg Oral Daily   pantoprazole  40 mg Oral QHS   QUEtiapine  25 mg Oral QHS   senna-docusate  1 tablet Oral BID   Continuous Infusions:     LOS: 6 days   Time spent: 63mn  Craig Cline C Eddy Termine, DO [facility]  If 7PM-7AM, please contact night-coverage [email or web address]  05/11/2022, 7:42 AM

## 2022-05-12 DIAGNOSIS — I6389 Other cerebral infarction: Secondary | ICD-10-CM | POA: Diagnosis not present

## 2022-05-12 LAB — BASIC METABOLIC PANEL
Anion gap: 14 (ref 5–15)
BUN: 65 mg/dL — ABNORMAL HIGH (ref 6–20)
CO2: 22 mmol/L (ref 22–32)
Calcium: 8.4 mg/dL — ABNORMAL LOW (ref 8.9–10.3)
Chloride: 102 mmol/L (ref 98–111)
Creatinine, Ser: 3.47 mg/dL — ABNORMAL HIGH (ref 0.61–1.24)
GFR, Estimated: 20 mL/min — ABNORMAL LOW (ref >60)
Glucose, Bld: 119 mg/dL — ABNORMAL HIGH (ref 70–99)
Potassium: 4.2 mmol/L (ref 3.5–5.1)
Sodium: 138 mmol/L (ref 135–145)

## 2022-05-12 LAB — GLUCOSE, CAPILLARY
Glucose-Capillary: 142 mg/dL — ABNORMAL HIGH (ref 70–99)
Glucose-Capillary: 222 mg/dL — ABNORMAL HIGH (ref 70–99)
Glucose-Capillary: 147 mg/dL — ABNORMAL HIGH (ref 70–99)
Glucose-Capillary: 219 mg/dL — ABNORMAL HIGH (ref 70–99)
Glucose-Capillary: 250 mg/dL — ABNORMAL HIGH (ref 70–99)

## 2022-05-12 LAB — CBC WITH DIFFERENTIAL/PLATELET
Abs Immature Granulocytes: 0.04 10*3/uL (ref 0.00–0.07)
Basophils Absolute: 0.1 10*3/uL (ref 0.0–0.1)
Basophils Relative: 1 %
Eosinophils Absolute: 0.4 10*3/uL (ref 0.0–0.5)
Eosinophils Relative: 4 %
HCT: 32.3 % — ABNORMAL LOW (ref 39.0–52.0)
Hemoglobin: 10.2 g/dL — ABNORMAL LOW (ref 13.0–17.0)
Immature Granulocytes: 0 %
Lymphocytes Relative: 13 %
Lymphs Abs: 1.2 10*3/uL (ref 0.7–4.0)
MCH: 26.2 pg (ref 26.0–34.0)
MCHC: 31.6 g/dL (ref 30.0–36.0)
MCV: 83 fL (ref 80.0–100.0)
Monocytes Absolute: 1.3 10*3/uL — ABNORMAL HIGH (ref 0.1–1.0)
Monocytes Relative: 14 %
Neutro Abs: 6.2 10*3/uL (ref 1.7–7.7)
Neutrophils Relative %: 68 %
Platelets: 245 10*3/uL (ref 150–400)
RBC: 3.89 MIL/uL — ABNORMAL LOW (ref 4.22–5.81)
RDW: 17.5 % — ABNORMAL HIGH (ref 11.5–15.5)
WBC: 9.2 10*3/uL (ref 4.0–10.5)
nRBC: 0 % (ref 0.0–0.2)

## 2022-05-12 NOTE — Progress Notes (Signed)
PROGRESS NOTE    Craig West  [id number] DOB: 02-27-1966 DOA: 05/05/2022 PCP: Charlott Rakes, MD   Brief Narrative:  57 yo PMH DM (lifestyle control) asthma,  HTN HLD gout hx tobacco use and current mj use, presented to ED 2/4 with L sided numbness and weakness over the prior 48h - progressed to a point he couldn't take his home medications. Hypertensive in ED, started on cleviprex gtt - taken to neuro ICU  given hemorrhagic stroke on CT at intake. Thought to be ischemic with conversion. Transferred to hospitalist group 05/08/22  Assessment & Plan:   Principal Problem:   Stroke [facility]) Active Problems:   Hemorrhagic stroke [facility])   Hypertensive emergency   Nontraumatic intracerebral hemorrhage ([place])  Acute right thalamic intraparenchymal hemorrhage Probable right thalamic acute ischemic stroke with hemorrhagic conversion Cerebral edema Repeat MRI/MRA shows right thalamic hematoma stable compared 05/05/22 Continue aggressive blood pressure control, see below Cleviprex weaned off Continue to hold anticoagulation or antiplatelets   Hypertensive emergency leading to intraparenchymal hemorrhage Likely the cause of above Blood pressure finally at goal after 4th medication addition Continue Coreg and amlodipine, hydralazine, and imdur   Acute delirium, resolved Continue delirium precautions Decrease Seroquel p.m. dose, discontinue a.m. dose given increased somnolence in the morning   CKD stage IV Non-anion gap metabolic acidosis, improving Serum creatinine is at baseline, around 3.5 Follow UOP   Prediabetes, well controlled Patient hemoglobin A1c is 5.7 Continue sliding scale insulin with CBG goal 140-180   Chronic HFpEF Without acute exacerbation Repeat echocardiogram showed EF 55 to 60% with grade 1 diastolic dysfunction Continue Lasix/Coreg, not on ACE inhibitor/ARB due to CKD Continue hydralazine, consider Imdur   Chronic anemia of chronic disease  Hemorrhoidal bleed,  resolved Patient hemoglobin at baseline, around 10 Scant BRBPR today with BM - patient notes hard stool are chronic - start stool softener   Probable obstructive sleep apnea Outpatient sleep study recommended per pulmonology  DVT prophylaxis: SCDs only given bleed as above Code Status: Full Family Communication: None present  Status is: Inpatient   Dispo: The patient is from: Home              Anticipated d/c is to: SNF              Anticipated d/c date is: Imminent              Patient currently is medically stable for discharge, awaiting safe disposition at SNF  Consultants:  PCCM  Procedures:  None  Antimicrobials:  None  Subjective: No acute issues or events overnight denies nausea vomiting diarrhea constipation headache fevers chills or chest pain  Objective: Vitals:   05/11/22 1546 05/11/22 2322 05/12/22 0346 05/12/22 0742  BP: 129/81 (!) 144/83 (!) 160/92 (!) 154/96  Pulse: 68 76 73 81  Resp: 16 16 18 16  $ Temp: 97.6 F (36.4 C) 97.9 F (36.6 C) 98.6 F (37 C) 97.7 F (36.5 C)  TempSrc: Oral Axillary Oral Oral  SpO2: 98% 100% 98% 100%  Weight:      Height:        Intake/Output Summary (Last 24 hours) at 05/12/2022 0748 Last data filed at 05/12/2022 0347 Gross per 24 hour  Intake 1021 ml  Output 2050 ml  Net -1029 ml    Filed Weights   05/05/22 1839 05/06/22 0246 05/08/22 0500  Weight: 102.1 kg 102.1 kg 103.2 kg    Examination:  General:  Pleasantly resting in bed, No acute distress. HEENT:  Normocephalic atraumatic.  Sclerae nonicteric, noninjected.  Extraocular movements intact bilaterally. Neck:  Without mass or deformity.  Trachea is midline. Lungs:  Clear to auscultate bilaterally without rhonchi, wheeze, or rales. Heart:  Regular rate and rhythm.  Without murmurs, rubs, or gallops. Abdomen:  Soft, nontender, nondistended.  Without guarding or rebound. Extremities: Without cyanosis, clubbing, edema, or obvious deformity. Vascular:  Dorsalis  pedis and posterior tibial pulses palpable bilaterally. Skin:  Warm and dry, no erythema, no rashes   Data Reviewed: I have personally reviewed following labs and imaging studies  CBC: Recent Labs  Lab 05/05/22 1833 05/07/22 0908 05/08/22 0540 05/09/22 0311 05/12/22 0551  WBC 11.5* 10.3 12.2* 12.6* 9.2  NEUTROABS  --  8.6* 9.6* 9.7* 6.2  HGB 10.5* 10.5* 10.5* 10.8* 10.2*  HCT 34.9* 34.7* 33.5* 34.0* 32.3*  MCV 89.5 87.4 85.5 85.0 83.0  PLT 256 257 241 227 [id number]    Basic Metabolic Panel: Recent Labs  Lab 05/06/22 0327 05/07/22 0908 05/08/22 0540 05/09/22 0311 05/12/22 0551  NA 139 137 139 135 138  K 4.2 4.3 3.9 4.2 4.2  CL 108 108 106 103 102  CO2 21* 24 21* 23 22  GLUCOSE 133* 199* 116* 119* 119*  BUN 56* 50* 49* 50* 65*  CREATININE 3.22* 2.95* 3.26* 3.53* 3.47*  CALCIUM 7.5* 7.4* 7.6* 8.0* 8.4*  MG  --   --   --  1.9  --     GFR: Estimated Creatinine Clearance: 29.3 mL/min (A) (by C-G formula based on SCr of 3.47 mg/dL (H)). Liver Function Tests: Recent Labs  Lab 05/05/22 1833  AST 31  ALT 43  ALKPHOS 173*  BILITOT 0.5  PROT 7.0  ALBUMIN 3.3*    No results for input(s): "LIPASE", "AMYLASE" in the last 168 hours. No results for input(s): "AMMONIA" in the last 168 hours. Coagulation Profile: Recent Labs  Lab 05/05/22 2244  INR 1.6*    Cardiac Enzymes: No results for input(s): "CKTOTAL", "CKMB", "CKMBINDEX", "TROPONINI" in the last 168 hours. BNP (last 3 results) No results for input(s): "PROBNP" in the last 8760 hours. HbA1C: No results for input(s): "HGBA1C" in the last 72 hours.  CBG: Recent Labs  Lab 05/11/22 0615 05/11/22 1209 05/11/22 1618 05/11/22 2107 05/12/22 0619  GLUCAP 144* 191* 125* 175* 142*    Lipid Profile: No results for input(s): "CHOL", "HDL", "LDLCALC", "TRIG", "CHOLHDL", "LDLDIRECT" in the last 72 hours.  Thyroid Function Tests: No results for input(s): "TSH", "T4TOTAL", "FREET4", "T3FREE", "THYROIDAB" in the last  72 hours. Anemia Panel: No results for input(s): "VITAMINB12", "FOLATE", "FERRITIN", "TIBC", "IRON", "RETICCTPCT" in the last 72 hours.  Sepsis Labs: No results for input(s): "PROCALCITON", "LATICACIDVEN" in the last 168 hours.  Recent Results (from the past 240 hour(s))  MRSA Next Gen by PCR, Nasal     Status: None   Collection Time: 05/05/22 10:09 PM   Specimen: Nasal Mucosa; Nasal Swab  Result Value Ref Range Status   MRSA by PCR Next Gen NOT DETECTED NOT DETECTED Final    Comment: (NOTE) The GeneXpert MRSA Assay (FDA approved for NASAL specimens only), is one component of a comprehensive MRSA colonization surveillance program. It is not intended to diagnose MRSA infection nor to guide or monitor treatment for MRSA infections. Test performance is not FDA approved in patients less than 59 years old. Performed at [facility], [place] [address] [place], [place] [address]          Radiology Studies: No results found.  Scheduled Meds:  allopurinol  100 mg Oral Daily   amLODipine  10 mg Oral Daily   atorvastatin  80 mg Oral Daily   carvedilol  25 mg Oral BID WC   Chlorhexidine Gluconate Cloth  6 each Topical Daily   colchicine  0.6 mg Oral BID   furosemide  40 mg Intravenous Q12H   hydrALAZINE  100 mg Oral Q8H   insulin aspart  0-9 Units Subcutaneous TID AC & HS   isosorbide mononitrate  30 mg Oral Daily   pantoprazole  40 mg Oral QHS   polyethylene glycol  17 g Oral Daily   QUEtiapine  25 mg Oral QHS   senna-docusate  1 tablet Oral BID   Continuous Infusions:     LOS: 7 days   Time spent: 99mn  Skylynne Schlechter C Itsel Opfer, DO [facility]  If 7PM-7AM, please contact night-coverage [email or web address]  05/12/2022, 7:48 AM

## 2022-05-13 DIAGNOSIS — I6389 Other cerebral infarction: Secondary | ICD-10-CM | POA: Diagnosis not present

## 2022-05-13 LAB — GLUCOSE, CAPILLARY
Glucose-Capillary: 160 mg/dL — ABNORMAL HIGH (ref 70–99)
Glucose-Capillary: 102 mg/dL — ABNORMAL HIGH (ref 70–99)
Glucose-Capillary: 208 mg/dL — ABNORMAL HIGH (ref 70–99)
Glucose-Capillary: 151 mg/dL — ABNORMAL HIGH (ref 70–99)
Glucose-Capillary: 141 mg/dL — ABNORMAL HIGH (ref 70–99)

## 2022-05-13 NOTE — Progress Notes (Signed)
Physical Therapy Treatment Patient Details Name: Craig West MRN: [id number] DOB: May 23, 1965 Today's Date: 05/13/2022   History of Present Illness Patient is a 57 y/o male who presents on 2/4 with chest pain and left sided weakness as well as facial droop. 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 CXR-pulmonary edema. PMH includes DM (lifestyle control) asthma, HTN, gout, hx tobacco use and current marijuana use.    PT Comments    Pt is demonstrating excellent progress towards his physical therapy goals, with improvements noted in LUE activation and motor control. Pt requiring min assist + 2 for standing from edge of bed to RW. Further distance today limited due to multiple episodes of bowel incontinence. Will benefit from [facility] to address deficits and maximize functional mobility.     Recommendations for follow up therapy are one component of a multi-disciplinary discharge planning process, led by the attending physician.  Recommendations may be updated based on patient status, additional functional criteria and insurance authorization.  Follow Up Recommendations  Skilled nursing-short term rehab (<3 hours/day) Can patient physically be transported by private vehicle: No   Assistance Recommended at Discharge Frequent or constant Supervision/Assistance  Patient can return home with the following Two people to help with walking and/or transfers;Assistance with cooking/housework;Assist for transportation;Help with stairs or ramp for entrance   Equipment Recommendations  Other (comment) (defer)    Recommendations for Other Services       Precautions / Restrictions Precautions Precautions: Fall Precaution Comments: L sensory motor deficits Restrictions Weight Bearing Restrictions: No     Mobility  Bed Mobility Overal bed mobility: Needs Assistance Bed Mobility: Rolling, Sidelying to Sit, Sit to Supine Rolling: Min  guard Sidelying to sit: Min guard   Sit to supine: Min guard   General bed mobility comments: Close min guard, assist/reminders to guard LUE    Transfers Overall transfer level: Needs assistance Equipment used: Rolling walker (2 wheels) Transfers: Sit to/from Stand Sit to Stand: Min assist, +2 physical assistance           General transfer comment: MinA + 2 to rise to stand from edge of bed; cues for pushing up with RUE from bed, and hand over hand placement with LUE on handle of RW. Once standing, pt able to weight shift R/L, with verbal cueing for L quad activation. further stepping limited due to bowel incontinence    Ambulation/Gait                   Stairs             Wheelchair Mobility    Modified Rankin (Stroke Patients Only)       Balance Overall balance assessment: Needs assistance Sitting-balance support: Feet supported Sitting balance-Leahy Scale: Fair     Standing balance support: Bilateral upper extremity supported Standing balance-Leahy Scale: Poor                              Cognition Arousal/Alertness: Awake/alert Behavior During Therapy: Flat affect, Impulsive Overall Cognitive Status: Impaired/Different from baseline Area of Impairment: Safety/judgement, Awareness, Attention                   Current Attention Level: Selective     Safety/Judgement: Decreased awareness of deficits Awareness: Emergent            Exercises Other Exercises Other Exercises: Supine: bridging x 10 Other Exercises: Working on  grip, motor control and pinch with LUE utilizing foam pieces    General Comments        Pertinent Vitals/Pain Pain Assessment Pain Assessment: Faces Faces Pain Scale: No hurt    Home Living                          Prior Function            PT Goals (current goals can now be found in the care plan section) Acute Rehab PT Goals Patient Stated Goal: to return to  independence Potential to Achieve Goals: Fair Progress towards PT goals: Progressing toward goals    Frequency    Min 3X/week      PT Plan Current plan remains appropriate    Co-evaluation              AM-PAC PT "6 Clicks" Mobility   Outcome Measure  Help needed turning from your back to your side while in a flat bed without using bedrails?: A Little Help needed moving from lying on your back to sitting on the side of a flat bed without using bedrails?: A Little Help needed moving to and from a bed to a chair (including a wheelchair)?: A Lot Help needed standing up from a chair using your arms (e.g., wheelchair or bedside chair)?: A Little Help needed to walk in hospital room?: Total Help needed climbing 3-5 steps with a railing? : Total 6 Click Score: 13    End of Session Equipment Utilized During Treatment: Gait belt Activity Tolerance: Patient tolerated treatment well Patient left: in bed;with call bell/phone within reach;with bed alarm set Nurse Communication: Mobility status PT Visit Diagnosis: Hemiplegia and hemiparesis;Difficulty in walking, not elsewhere classified (R26.2);Unsteadiness on feet (R26.81) Hemiplegia - Right/Left: Left Hemiplegia - dominant/non-dominant: Non-dominant Hemiplegia - caused by: Cerebral infarction     Time: [id number] PT Time Calculation (min) (ACUTE ONLY): 27 min  Charges:  $Therapeutic Activity: 23-37 mins                     Wyona Almas, PT, DPT Acute Rehabilitation Services Office [phone]    Deno Etienne 05/13/2022, 3:11 PM

## 2022-05-13 NOTE — TOC Progression Note (Signed)
Transition of Care [facility]) - Progression Note    Patient Details  Name: Craig West MRN: [id number] Date of Birth: [birth year]  Transition of Care [facility]) CM/SW [place], Emanuel Phone Number: 05/13/2022, 3:43 PM  Clinical Narrative:   CSW contacted [facility] again to ask about insurance authorization for patient. [facility] called back and said they would start authorization, but then called back later and said they would need to rescind the bed offer due to patient's marijuana use. Patient's only bed offer is at [facility]. CSW met with patient to discuss, and he is in agreement. CSW asked [facility] Glazier to initiate insurance authorization. CSW to follow.    Expected Discharge Plan: Skilled Nursing Facility Barriers to Discharge: Insurance Authorization  Expected Discharge Plan and [place] Choice: [place] Living arrangements for the past 2 months: Single Family Home                                       Social Determinants of Health (SDOH) Interventions SDOH Screenings   Food Insecurity: No Food Insecurity (05/09/2022)  Housing: Low Risk  (05/09/2022)  Transportation Needs: No Transportation Needs (05/09/2022)  Utilities: Not At Risk (05/09/2022)  Alcohol Screen: Low Risk  (01/06/2020)  Depression (PHQ2-9): Low Risk  (11/06/2021)  Financial Resource Strain: High Risk (06/12/2021)  Physical Activity: Inactive (06/12/2021)  Social Connections: Socially Isolated (06/12/2021)  Stress: Stress Concern Present (06/12/2021)  Tobacco Use: Medium Risk (05/09/2022)    Readmission Risk Interventions     No data to display

## 2022-05-13 NOTE — Progress Notes (Signed)
PROGRESS NOTE    Craig West  [id number] DOB: [birth year] DOA: 05/05/2022 PCP: Charlott Rakes, MD   Brief Narrative:  57 yo PMH DM (lifestyle control) asthma,  HTN HLD gout hx tobacco use and current mj use, presented to ED 2/4 with L sided numbness and weakness over the prior 48h - progressed to a point he couldn't take his home medications. Hypertensive in ED, started on cleviprex gtt - taken to neuro ICU  given hemorrhagic stroke on CT at intake. Thought to be ischemic with conversion. Transferred to hospitalist group 05/08/22  Patient remains medically stable for discharge - awaiting safe disposition - SNF bed/insurance auth pending.  Assessment & Plan:   Principal Problem:   Stroke [facility]) Active Problems:   Hemorrhagic stroke [facility])   Hypertensive emergency   Nontraumatic intracerebral hemorrhage ([facility])  Acute right thalamic intraparenchymal hemorrhage Probable right thalamic acute ischemic stroke with hemorrhagic conversion Cerebral edema Repeat MRI/MRA shows right thalamic hematoma stable compared 05/05/22 Continue aggressive blood pressure control, see below Cleviprex weaned off Continue to hold anticoagulation or antiplatelets   Hypertensive emergency leading to intraparenchymal hemorrhage, resolved Likely the cause of above Blood pressure finally at goal after 4th medication addition Continue Coreg and amlodipine, hydralazine, and imdur   Acute delirium, resolved Continue delirium precautions Decrease Seroquel p.m. dose, discontinue a.m. dose given increased somnolence in the morning   CKD stage IV Non-anion gap metabolic acidosis, improving Serum creatinine is at baseline, around 3.5 Follow UOP   Prediabetes, well controlled Patient hemoglobin A1c is 5.7 Continue sliding scale insulin with CBG goal 140-180   Chronic HFpEF Without acute exacerbation Repeat echocardiogram showed EF 55 to 60% with grade 1 diastolic dysfunction Continue Lasix/Coreg, not on ACE  inhibitor/ARB due to CKD Continue hydralazine, consider Imdur   Chronic anemia of chronic disease  Hemorrhoidal bleed, resolved Patient hemoglobin at baseline, around 10 Scant BRBPR today with BM - patient notes hard stool are chronic - start stool softener   Probable obstructive sleep apnea Outpatient sleep study recommended per pulmonology  DVT prophylaxis: SCDs only given bleed as above Code Status: Full Family Communication: None present  Status is: Inpatient   Dispo: The patient is from: Home              Anticipated d/c is to: SNF              Anticipated d/c date is: Imminent              Patient currently is medically stable for discharge, awaiting safe disposition at SNF  Consultants:  PCCM  Procedures:  None  Antimicrobials:  None  Subjective: No acute issues or events overnight denies nausea vomiting diarrhea constipation headache fevers chills or chest pain  Objective: Vitals:   05/12/22 2351 05/13/22 0432 05/13/22 0504 05/13/22 0801  BP: 130/87  (!) 149/110 (!) 148/91  Pulse: 71 75 77 73  Resp: 16 18  16  $ Temp: 98.3 F (36.8 C) 97.9 F (36.6 C)  98 F (36.7 C)  TempSrc:  Oral  Oral  SpO2: 100% 100%  100%  Weight:      Height:        Intake/Output Summary (Last 24 hours) at 05/13/2022 0803 Last data filed at 05/13/2022 0500 Gross per 24 hour  Intake 250 ml  Output 2550 ml  Net -2300 ml    Filed Weights   05/05/22 1839 05/06/22 0246 05/08/22 0500  Weight: 102.1 kg 102.1 kg 103.2 kg    Examination:  General:  Pleasantly resting in bed, No acute distress. HEENT:  Normocephalic atraumatic.  Sclerae nonicteric, noninjected.  Extraocular movements intact bilaterally. Neck:  Without mass or deformity.  Trachea is midline. Lungs:  Clear to auscultate bilaterally without rhonchi, wheeze, or rales. Heart:  Regular rate and rhythm.  Without murmurs, rubs, or gallops. Abdomen:  Soft, nontender, nondistended.  Without guarding or  rebound. Extremities: Without cyanosis, clubbing, edema, or obvious deformity. Vascular:  Dorsalis pedis and posterior tibial pulses palpable bilaterally. Skin:  Warm and dry, no erythema, no rashes   Data Reviewed: I have personally reviewed following labs and imaging studies  CBC: Recent Labs  Lab 05/07/22 0908 05/08/22 0540 05/09/22 0311 05/12/22 0551  WBC 10.3 12.2* 12.6* 9.2  NEUTROABS 8.6* 9.6* 9.7* 6.2  HGB 10.5* 10.5* 10.8* 10.2*  HCT 34.7* 33.5* 34.0* 32.3*  MCV 87.4 85.5 85.0 83.0  PLT 257 241 227 [id number]    Basic Metabolic Panel: Recent Labs  Lab 05/07/22 0908 05/08/22 0540 05/09/22 0311 05/12/22 0551  NA 137 139 135 138  K 4.3 3.9 4.2 4.2  CL 108 106 103 102  CO2 24 21* 23 22  GLUCOSE 199* 116* 119* 119*  BUN 50* 49* 50* 65*  CREATININE 2.95* 3.26* 3.53* 3.47*  CALCIUM 7.4* 7.6* 8.0* 8.4*  MG  --   --  1.9  --     GFR: Estimated Creatinine Clearance: 29.3 mL/min (A) (by C-G formula based on SCr of 3.47 mg/dL (H)). Liver Function Tests: No results for input(s): "AST", "ALT", "ALKPHOS", "BILITOT", "PROT", "ALBUMIN" in the last 168 hours.  No results for input(s): "LIPASE", "AMYLASE" in the last 168 hours. No results for input(s): "AMMONIA" in the last 168 hours. Coagulation Profile: No results for input(s): "INR", "PROTIME" in the last 168 hours.  Cardiac Enzymes: No results for input(s): "CKTOTAL", "CKMB", "CKMBINDEX", "TROPONINI" in the last 168 hours. BNP (last 3 results) No results for input(s): "PROBNP" in the last 8760 hours. HbA1C: No results for input(s): "HGBA1C" in the last 72 hours.  CBG: Recent Labs  Lab 05/12/22 1120 05/12/22 1638 05/12/22 2121 05/12/22 2148 05/13/22 0610  GLUCAP 222* 147* 219* 250* 102*    Lipid Profile: No results for input(s): "CHOL", "HDL", "LDLCALC", "TRIG", "CHOLHDL", "LDLDIRECT" in the last 72 hours.  Thyroid Function Tests: No results for input(s): "TSH", "T4TOTAL", "FREET4", "T3FREE", "THYROIDAB" in  the last 72 hours. Anemia Panel: No results for input(s): "VITAMINB12", "FOLATE", "FERRITIN", "TIBC", "IRON", "RETICCTPCT" in the last 72 hours.  Sepsis Labs: No results for input(s): "PROCALCITON", "LATICACIDVEN" in the last 168 hours.  Recent Results (from the past 240 hour(s))  MRSA Next Gen by PCR, Nasal     Status: None   Collection Time: 05/05/22 10:09 PM   Specimen: Nasal Mucosa; Nasal Swab  Result Value Ref Range Status   MRSA by PCR Next Gen NOT DETECTED NOT DETECTED Final    Comment: (NOTE) The GeneXpert MRSA Assay (FDA approved for NASAL specimens only), is one component of a comprehensive MRSA colonization surveillance program. It is not intended to diagnose MRSA infection nor to guide or monitor treatment for MRSA infections. Test performance is not FDA approved in patients less than 39 years old. Performed at [facility], [place] [address] [place], [place] [address]          Radiology Studies: No results found.  Scheduled Meds:  allopurinol  100 mg Oral Daily   amLODipine  10 mg Oral Daily   atorvastatin  80 mg  Oral Daily   carvedilol  25 mg Oral BID WC   Chlorhexidine Gluconate Cloth  6 each Topical Daily   furosemide  40 mg Intravenous Q12H   hydrALAZINE  100 mg Oral Q8H   insulin aspart  0-9 Units Subcutaneous TID AC & HS   isosorbide mononitrate  30 mg Oral Daily   pantoprazole  40 mg Oral QHS   polyethylene glycol  17 g Oral Daily   QUEtiapine  25 mg Oral QHS   senna-docusate  1 tablet Oral BID   Continuous Infusions:     LOS: 8 days   Time spent: 7mn  Mayumi Summerson C Coriann Brouhard, DO [facility]  If 7PM-7AM, please contact night-coverage [email or web address]  05/13/2022, 8:03 AM

## 2022-05-13 NOTE — Plan of Care (Signed)
  Problem: Education: Goal: Knowledge of disease or condition will improve Outcome: Progressing Goal: Knowledge of secondary prevention will improve (MUST DOCUMENT ALL) Outcome: Progressing Goal: Knowledge of patient specific risk factors will improve Craig [place] N/A or DELETE if not current risk factor) Outcome: Progressing   Problem: Intracerebral Hemorrhage Tissue Perfusion: Goal: Complications of Intracerebral Hemorrhage will be minimized Outcome: Progressing   Problem: Coping: Goal: Will verbalize positive feelings about self Outcome: Progressing Goal: Will identify appropriate support needs Outcome: Progressing   Problem: Health Behavior/Discharge Planning: Goal: Ability to manage health-related needs will improve Outcome: Progressing Goal: Goals will be collaboratively established with patient/family Outcome: Progressing   Problem: Self-Care: Goal: Ability to participate in self-care as condition permits will improve Outcome: Progressing Goal: Verbalization of feelings and concerns over difficulty with self-care will improve Outcome: Progressing Goal: Ability to communicate needs accurately will improve Outcome: Progressing   Problem: Nutrition: Goal: Risk of aspiration will decrease Outcome: Progressing Goal: Dietary intake will improve Outcome: Progressing   Problem: Education: Goal: Ability to describe self-care measures that may prevent or decrease complications (Diabetes Survival Skills Education) will improve Outcome: Progressing Goal: Individualized Educational Video(s) Outcome: Progressing   Problem: Coping: Goal: Ability to adjust to condition or change in health will improve Outcome: Progressing   Problem: Fluid Volume: Goal: Ability to maintain a balanced intake and output will improve Outcome: Progressing   Problem: Health Behavior/Discharge Planning: Goal: Ability to identify and utilize available resources and services will improve Outcome:  Progressing Goal: Ability to manage health-related needs will improve Outcome: Progressing   Problem: Metabolic: Goal: Ability to maintain appropriate glucose levels will improve Outcome: Progressing   Problem: Nutritional: Goal: Maintenance of adequate nutrition will improve Outcome: Progressing Goal: Progress toward achieving an optimal weight will improve Outcome: Progressing   Problem: Skin Integrity: Goal: Risk for impaired skin integrity will decrease Outcome: Progressing   Problem: Tissue Perfusion: Goal: Adequacy of tissue perfusion will improve Outcome: Progressing   Problem: Education: Goal: Knowledge of General Education information will improve Description: Including pain rating scale, medication(s)/side effects and non-pharmacologic comfort measures Outcome: Progressing   Problem: Health Behavior/Discharge Planning: Goal: Ability to manage health-related needs will improve Outcome: Progressing   Problem: Clinical Measurements: Goal: Ability to maintain clinical measurements within normal limits will improve Outcome: Progressing Goal: Will remain free from infection Outcome: Progressing Goal: Diagnostic test results will improve Outcome: Progressing Goal: Respiratory complications will improve Outcome: Progressing Goal: Cardiovascular complication will be avoided Outcome: Progressing   Problem: Activity: Goal: Risk for activity intolerance will decrease Outcome: Progressing   Problem: Nutrition: Goal: Adequate nutrition will be maintained Outcome: Progressing   Problem: Coping: Goal: Level of anxiety will decrease Outcome: Progressing   Problem: Elimination: Goal: Will not experience complications related to bowel motility Outcome: Progressing Goal: Will not experience complications related to urinary retention Outcome: Progressing   Problem: Pain Managment: Goal: General experience of comfort will improve Outcome: Progressing   Problem:  Safety: Goal: Ability to remain free from injury will improve Outcome: Progressing   Problem: Skin Integrity: Goal: Risk for impaired skin integrity will decrease Outcome: Progressing

## 2022-05-14 ENCOUNTER — Other Ambulatory Visit: Payer: Self-pay

## 2022-05-14 DIAGNOSIS — I5032 Chronic diastolic (congestive) heart failure: Secondary | ICD-10-CM | POA: Diagnosis not present

## 2022-05-14 DIAGNOSIS — I10 Essential (primary) hypertension: Secondary | ICD-10-CM | POA: Diagnosis not present

## 2022-05-14 DIAGNOSIS — I619 Nontraumatic intracerebral hemorrhage, unspecified: Secondary | ICD-10-CM | POA: Diagnosis not present

## 2022-05-14 DIAGNOSIS — J45909 Unspecified asthma, uncomplicated: Secondary | ICD-10-CM | POA: Diagnosis not present

## 2022-05-14 DIAGNOSIS — E1143 Type 2 diabetes mellitus with diabetic autonomic (poly)neuropathy: Secondary | ICD-10-CM | POA: Diagnosis not present

## 2022-05-14 DIAGNOSIS — I6389 Other cerebral infarction: Secondary | ICD-10-CM | POA: Diagnosis not present

## 2022-05-14 DIAGNOSIS — I161 Hypertensive emergency: Secondary | ICD-10-CM | POA: Diagnosis not present

## 2022-05-14 DIAGNOSIS — R079 Chest pain, unspecified: Secondary | ICD-10-CM | POA: Diagnosis not present

## 2022-05-14 DIAGNOSIS — E785 Hyperlipidemia, unspecified: Secondary | ICD-10-CM | POA: Diagnosis not present

## 2022-05-14 LAB — GLUCOSE, CAPILLARY: Glucose-Capillary: 118 mg/dL — ABNORMAL HIGH (ref 70–99)

## 2022-05-14 MED ORDER — DOCUSATE SODIUM 100 MG PO CAPS
100.0000 mg | ORAL_CAPSULE | Freq: Two times a day (BID) | ORAL | 0 refills | Status: AC | PRN
  Filled 2022-05-14: qty 10, 5d supply, fill #0

## 2022-05-14 MED ORDER — PANTOPRAZOLE SODIUM 40 MG PO TBEC
40.0000 mg | DELAYED_RELEASE_TABLET | Freq: Every day | ORAL | 1 refills | Status: DC
  Filled 2022-05-14: qty 30, 30d supply, fill #0

## 2022-05-14 MED ORDER — ISOSORBIDE MONONITRATE ER 60 MG PO TB24
60.0000 mg | ORAL_TABLET | Freq: Every day | ORAL | 1 refills | Status: DC
  Filled 2022-05-14 – 2022-08-08 (×2): qty 30, 30d supply, fill #0

## 2022-05-14 MED ORDER — HYDRALAZINE HCL 100 MG PO TABS
100.0000 mg | ORAL_TABLET | Freq: Three times a day (TID) | ORAL | 1 refills | Status: DC
  Filled 2022-05-14 – 2022-08-08 (×2): qty 90, 30d supply, fill #0

## 2022-05-14 MED ORDER — ISOSORBIDE MONONITRATE ER 60 MG PO TB24
60.0000 mg | ORAL_TABLET | Freq: Every day | ORAL | Status: DC
  Administered 2022-05-14: 60 mg via ORAL
  Filled 2022-05-14: qty 1

## 2022-05-14 NOTE — TOC Transition Note (Signed)
Transition of Care [facility]) - CM/SW Discharge Note   Patient Details  Name: Craig West MRN: [id number] Date of Birth: 1966/01/02  [facility] CM/SW Contact:  Geralynn Ochs, LCSW Phone Number: 05/14/2022, 9:57 AM   Clinical Narrative:   CSW updated that authorization has been received, patient can admit to SNF this morning. CSW sent discharge information, confirmed bed availability at [facility]. Transport arranged with [facility] for next available.  Nurse to call report to [phone], Room 423-A.    Final next level of care: Skilled Nursing Facility Barriers to Discharge: Barriers Resolved   Patient Goals and CMS Choice CMS Medicare.gov Compare Post Acute Care list provided to:: Patient Choice offered to / list presented to : Patient  Discharge Placement                Patient chooses bed at: [facility] Patient to be transferred to facility by: [place] Name of family member notified: Self Patient and family notified of of transfer: 05/14/22  Discharge Plan and Services Additional resources added to the After Visit Summary for       Post Acute Care Choice: [place]                               Social Determinants of Health (SDOH) Interventions SDOH Screenings   Food Insecurity: No Food Insecurity (05/09/2022)  Housing: Low Risk  (05/09/2022)  Transportation Needs: No Transportation Needs (05/09/2022)  Utilities: Not At Risk (05/09/2022)  Alcohol Screen: Low Risk  (01/06/2020)  Depression (PHQ2-9): Low Risk  (11/06/2021)  Financial Resource Strain: High Risk (06/12/2021)  Physical Activity: Inactive (06/12/2021)  Social Connections: Socially Isolated (06/12/2021)  Stress: Stress Concern Present (06/12/2021)  Tobacco Use: Medium Risk (05/09/2022)     Readmission Risk Interventions     No data to display

## 2022-05-14 NOTE — Progress Notes (Signed)
Dressed pt and removed IV [facility] wheeled pt off unit

## 2022-05-14 NOTE — Plan of Care (Signed)
  Problem: Education: Goal: Knowledge of disease or condition will improve Outcome: Progressing Goal: Knowledge of secondary prevention will improve (MUST DOCUMENT ALL) Outcome: Progressing Goal: Knowledge of patient specific risk factors will improve [place] N/A or DELETE if not current risk factor) Outcome: Progressing   Problem: Intracerebral Hemorrhage Tissue Perfusion: Goal: Complications of Intracerebral Hemorrhage will be minimized Outcome: Progressing   Problem: Education: Goal: Knowledge of General Education information will improve Description: Including pain rating scale, medication(s)/side effects and non-pharmacologic comfort measures Outcome: Progressing   Problem: Health Behavior/Discharge Planning: Goal: Ability to manage health-related needs will improve Outcome: Progressing   Problem: Education: Goal: Knowledge of disease or condition will improve Outcome: Progressing Goal: Knowledge of secondary prevention will improve (MUST DOCUMENT ALL) Outcome: Progressing Goal: Knowledge of patient specific risk factors will improve [place] N/A or DELETE if not current risk factor) Outcome: Progressing   Problem: Intracerebral Hemorrhage Tissue Perfusion: Goal: Complications of Intracerebral Hemorrhage will be minimized Outcome: Progressing   Problem: Education: Goal: Knowledge of General Education information will improve Description: Including pain rating scale, medication(s)/side effects and non-pharmacologic comfort measures Outcome: Progressing   Problem: Health Behavior/Discharge Planning: Goal: Ability to manage health-related needs will improve Outcome: Progressing

## 2022-05-14 NOTE — Discharge Summary (Signed)
Physician Discharge Summary  Craig West [id number] DOB: [birth year] DOA: 05/05/2022  PCP: Charlott Rakes, MD  Admit date: 05/05/2022 Discharge date: 05/14/2022  Admitted From: Home Disposition:  SNF  Recommendations for Outpatient Follow-up:  Follow up with PCP in 1-2 weeks Follow up with neurology as scheduled  Discharge Condition:Stable  CODE STATUS:Full  Diet recommendation: Low salt low fat diet  Brief/Interim Summary: 57 yo PMH DM (lifestyle control) asthma,  HTN HLD gout hx tobacco use and current mj use, presented to ED 2/4 with L sided numbness and weakness over the prior 48h - progressed to a point he couldn't take his home medications. Hypertensive in ED, started on cleviprex gtt - taken to neuro ICU  given hemorrhagic stroke on CT at intake. Thought to be ischemic with conversion. Transferred to hospitalist group 05/08/22.   Patient remains medically stable for discharge - awaiting safe disposition - SNF bed/insurance auth pending.  Discharge Diagnoses:  Principal Problem:   Stroke [facility]) Active Problems:   Hemorrhagic stroke [facility])   Hypertensive emergency   Nontraumatic intracerebral hemorrhage (HCC)  Acute right thalamic intraparenchymal hemorrhage Probable right thalamic acute ischemic stroke with hemorrhagic conversion Cerebral edema Repeat MRI/MRA shows right thalamic hematoma stable compared 05/05/22 Continue aggressive blood pressure control, see below Cleviprex weaned off Continue to hold anticoagulation or antiplatelets   Hypertensive emergency leading to intraparenchymal hemorrhage, resolved Likely the cause of above Blood pressure finally at goal after 4th medication addition Continue Coreg and amlodipine, hydralazine, and imdur   Acute delirium, resolved Continue delirium precautions Decrease Seroquel p.m. dose, discontinue a.m. dose given increased somnolence in the morning   CKD stage IV Non-anion gap metabolic acidosis, improving Serum creatinine  is at baseline, around 3.5 Follow UOP   Prediabetes, well controlled Patient hemoglobin A1c is 5.7 Continue sliding scale insulin with CBG goal 140-180   Chronic HFpEF Without acute exacerbation Repeat echocardiogram showed EF 55 to 60% with grade 1 diastolic dysfunction Continue Lasix/Coreg, not on ACE inhibitor/ARB due to CKD Continue hydralazine, consider Imdur   Chronic anemia of chronic disease  Hemorrhoidal bleed, resolved Patient hemoglobin at baseline, around 10 Scant BRBPR today with BM - patient notes hard stool are chronic - start stool softener   Probable obstructive sleep apnea Outpatient sleep study recommended per pulmonology  Discharge Instructions   Allergies as of 05/14/2022   No Known Allergies      Medication List     TAKE these medications    allopurinol 100 MG tablet Commonly known as: ZYLOPRIM Take 1 tablet (100 mg total) by mouth daily.   amLODipine 10 MG tablet Commonly known as: NORVASC Take 1 tablet (10 mg total) by mouth daily.   atorvastatin 80 MG tablet Commonly known as: LIPITOR Take 1 tablet (80 mg total) by mouth daily.   carvedilol 25 MG tablet Commonly known as: COREG Take 1 tablet (25 mg total) by mouth 2 (two) times daily with a meal.   colchicine 0.6 MG tablet Take 1 tablet by mouth as needed. Take 1 tablet (0.6 mg) by mouth at the onset of a gout attack with next dose no sooner than 3 days later   Digital Glass Scale Misc Use scale to weight yourself. Increasing weight may indicate fluid overload   docusate sodium 100 MG capsule Commonly known as: COLACE Take 1 capsule (100 mg total) by mouth 2 (two) times daily as needed for mild constipation.   furosemide 20 MG tablet Commonly known as: LASIX Take 3 tablets (60 mg total)  by mouth 2 (two) times daily.   hydrALAZINE 100 MG tablet Commonly known as: APRESOLINE Take 1 tablet (100 mg total) by mouth every 8 (eight) hours. What changed:  medication strength how  much to take when to take this   isosorbide mononitrate 60 MG 24 hr tablet Commonly known as: IMDUR Take 1 tablet (60 mg total) by mouth daily. Start taking on: May 15, 2022   pantoprazole 40 MG tablet Commonly known as: PROTONIX Take 1 tablet (40 mg total) by mouth at bedtime.   True Metrix Blood Glucose Test test strip Generic drug: glucose blood Use as instructed   True Metrix Meter w/Device Kit Check blood sugar fasting and ant bedtime and record   TRUEplus Lancets 28G Misc Check blood sugar fasting and at bedtime   Ventolin HFA 108 (90 Base) MCG/ACT inhaler Generic drug: albuterol Inhale 1-2 puffs into the lungs every 6 (six) hours as needed for wheezing or shortness of breath.   albuterol (2.5 MG/3ML) 0.083% nebulizer solution Commonly known as: PROVENTIL Take 3 mLs (2.5 mg total) by nebulization every 6 (six) hours as needed for wheezing or shortness of breath.        No Known Allergies  Consultations: Neuro   Procedures/Studies: MR BRAIN WO CONTRAST  Result Date: 05/07/2022 CLINICAL DATA:  57 year old male who presented with right thalamic hemorrhage on 05/05/2022. EXAM: MRI HEAD WITHOUT CONTRAST MRA HEAD WITHOUT CONTRAST TECHNIQUE: Multiplanar, multi-echo pulse sequences of the brain and surrounding structures were acquired without intravenous contrast. Angiographic images of the Circle of Willis were acquired using MRA technique without intravenous contrast. COMPARISON:  Head CT 05/06/2022 and earlier. FINDINGS: MRI HEAD FINDINGS Brain: Oval and lobulated largely T2 hypointense, T1 I so to slightly hyperintense intra-axial hematoma at the right thalamus measures 15 by 20 x 19 mm (AP by transverse by CC) on MRI for estimated blood volume of 3 mL, not significantly changed since presentation. Surrounding edema tracking from the right thalamus into the posterior right corona radiata, also into the right brainstem. But only mild regional mass effect. No convincing  intraventricular blood. No ventriculomegaly. On DWI there is susceptibility associated with the right thalamic blood products, no regional diffusion restriction. And no restricted diffusion elsewhere. Chronic hemosiderin is present in the low left lateral pons. Faint chronic microhemorrhage also suspected in the lateral left thalamus on series 9, image 17. Also there is heterogeneity in the left deep cerebellar nucleus with suspicion of hemosiderin there, and confluent regional T2 and FLAIR hyperintensity but no regional mass effect (series 8, image 7). This area most resembles encephalomalacia on the previous CT. And some of the T2 and FLAIR heterogeneity in the right brainstem might also be Wallerian degeneration. No other chronic cerebral blood products by T2 *. Basilar cisterns are normal. Scattered mild to moderate for age additional bilateral cerebral white matter T2 and FLAIR hyperintensity. Evidence of multiple small chronic lacunar infarcts in the other deep gray nuclei, including the bilateral lentiform. Cervicomedullary junction and pituitary are within normal limits. Vascular: Major intracranial vascular flow voids are preserved. Skull and upper cervical spine: Negative. Visualized bone marrow signal is within normal limits. Sinuses/Orbits: Globe motion artifact, otherwise negative orbits. Paranasal sinuses and mastoids are stable and well aerated. Other: Negative visible internal auditory structures. Visible scalp and face appear negative. MRA HEAD FINDINGS Anterior circulation: Antegrade flow in both ICA siphons. Mild bilateral siphon irregularity, no significant siphon stenosis. Patent carotid termini, MCA and ACA origins. Allowing for some motion artifact, proximal MCA  and ACA branches are within normal limits. Posterior circulation: Antegrade flow in the posterior circulation with codominant distal vertebral arteries. Patent vertebrobasilar junction with no stenosis. Partially visible left PICA and  right AICA appear patent. Patent basilar artery without stenosis. Basilar tip and PCA origins are patent. Posterior communicating arteries are diminutive or absent. Visible bilateral PCA branches are within normal limits when allowing for motion. There is no MRA flow signal identified within the right thalamic hematoma. No MRA evidence of vascular malformation. Anatomic variants: None significant. IMPRESSION: 1. Right thalamic hematoma is stable since 05/05/2022, estimated blood volume of 3 mL. Regional edema but no significant intracranial mass effect. And no IVH, ventriculomegaly or other complicating features. 2. Negative motion degraded intracranial MRA. And evidence of chronic small vessel disease, with other chronic microhemorrhages in the left thalamus, left pons, and also the deep left cerebellum. Suspect some encephalomalacia in the cerebellum, and also some chronic Wallerian degeneration in the right brainstem. 3. No other acute intracranial abnormality. Electronically Signed   By: Genevie Ann M.D.   On: 05/07/2022 13:01   MR ANGIO HEAD WO CONTRAST  Result Date: 05/07/2022 CLINICAL DATA:  57 year old male who presented with right thalamic hemorrhage on 05/05/2022. EXAM: MRI HEAD WITHOUT CONTRAST MRA HEAD WITHOUT CONTRAST TECHNIQUE: Multiplanar, multi-echo pulse sequences of the brain and surrounding structures were acquired without intravenous contrast. Angiographic images of the Circle of Willis were acquired using MRA technique without intravenous contrast. COMPARISON:  Head CT 05/06/2022 and earlier. FINDINGS: MRI HEAD FINDINGS Brain: Oval and lobulated largely T2 hypointense, T1 I so to slightly hyperintense intra-axial hematoma at the right thalamus measures 15 by 20 x 19 mm (AP by transverse by CC) on MRI for estimated blood volume of 3 mL, not significantly changed since presentation. Surrounding edema tracking from the right thalamus into the posterior right corona radiata, also into the right  brainstem. But only mild regional mass effect. No convincing intraventricular blood. No ventriculomegaly. On DWI there is susceptibility associated with the right thalamic blood products, no regional diffusion restriction. And no restricted diffusion elsewhere. Chronic hemosiderin is present in the low left lateral pons. Faint chronic microhemorrhage also suspected in the lateral left thalamus on series 9, image 17. Also there is heterogeneity in the left deep cerebellar nucleus with suspicion of hemosiderin there, and confluent regional T2 and FLAIR hyperintensity but no regional mass effect (series 8, image 7). This area most resembles encephalomalacia on the previous CT. And some of the T2 and FLAIR heterogeneity in the right brainstem might also be Wallerian degeneration. No other chronic cerebral blood products by T2 *. Basilar cisterns are normal. Scattered mild to moderate for age additional bilateral cerebral white matter T2 and FLAIR hyperintensity. Evidence of multiple small chronic lacunar infarcts in the other deep gray nuclei, including the bilateral lentiform. Cervicomedullary junction and pituitary are within normal limits. Vascular: Major intracranial vascular flow voids are preserved. Skull and upper cervical spine: Negative. Visualized bone marrow signal is within normal limits. Sinuses/Orbits: Globe motion artifact, otherwise negative orbits. Paranasal sinuses and mastoids are stable and well aerated. Other: Negative visible internal auditory structures. Visible scalp and face appear negative. MRA HEAD FINDINGS Anterior circulation: Antegrade flow in both ICA siphons. Mild bilateral siphon irregularity, no significant siphon stenosis. Patent carotid termini, MCA and ACA origins. Allowing for some motion artifact, proximal MCA and ACA branches are within normal limits. Posterior circulation: Antegrade flow in the posterior circulation with codominant distal vertebral arteries. Patent  vertebrobasilar junction with  no stenosis. Partially visible left PICA and right AICA appear patent. Patent basilar artery without stenosis. Basilar tip and PCA origins are patent. Posterior communicating arteries are diminutive or absent. Visible bilateral PCA branches are within normal limits when allowing for motion. There is no MRA flow signal identified within the right thalamic hematoma. No MRA evidence of vascular malformation. Anatomic variants: None significant. IMPRESSION: 1. Right thalamic hematoma is stable since 05/05/2022, estimated blood volume of 3 mL. Regional edema but no significant intracranial mass effect. And no IVH, ventriculomegaly or other complicating features. 2. Negative motion degraded intracranial MRA. And evidence of chronic small vessel disease, with other chronic microhemorrhages in the left thalamus, left pons, and also the deep left cerebellum. Suspect some encephalomalacia in the cerebellum, and also some chronic Wallerian degeneration in the right brainstem. 3. No other acute intracranial abnormality. Electronically Signed   By: Genevie Ann M.D.   On: 05/07/2022 13:01   CT HEAD WO CONTRAST (5MM)  Result Date: 05/06/2022 CLINICAL DATA:  Follow-up examination for stroke. EXAM: CT HEAD WITHOUT CONTRAST TECHNIQUE: Contiguous axial images were obtained from the base of the skull through the vertex without intravenous contrast. RADIATION DOSE REDUCTION: This exam was performed according to the departmental dose-optimization program which includes automated exposure control, adjustment of the mA and/or kV according to patient size and/or use of iterative reconstruction technique. COMPARISON:  Prior CT from earlier the same day. FINDINGS: Brain: Again the previously identified intraparenchymal hematoma centered at the right thalamus again seen, not significantly changed in size and morphology as compared to previous. Mild localized edema without significant regional mass effect. No  intraventricular extension or other complicating features. No significant midline shift. Basilar cisterns remain patent. No new intracranial hemorrhage or large vessel territory infarct. No mass lesion. No hydrocephalus or extra-axial fluid collection. Vascular: No abnormal hyperdense vessel. Skull: Scalp soft tissues and calvarium demonstrate no new finding. Sinuses/Orbits: Globes and orbital soft tissues within normal limits. Paranasal sinuses and mastoid air cells remain largely clear. Other: None. IMPRESSION: 1. No significant interval change in size and morphology of intraparenchymal hematoma centered at the right thalamus. Mild localized edema without significant regional mass effect. 2. No other new acute intracranial abnormality. Electronically Signed   By: Jeannine Boga M.D.   On: 05/06/2022 20:30   ECHOCARDIOGRAM COMPLETE  Result Date: 05/06/2022    ECHOCARDIOGRAM REPORT   Patient Name:   JONATHN SANTOLI Date of Exam: 05/06/2022 Medical Rec #:  [id number]    Height:       73.0 in Accession #:    [id number]   Weight:       225.0 lb Date of Birth:  [birth year]    BSA:          2.262 m Patient Age:    81 years     BP:           143/91 mmHg Patient Gender: M            HR:           95 bpm. Exam Location:  Inpatient Procedure: 2D Echo, Cardiac Doppler and Color Doppler Indications:    I63.9 Stroke  History:        Patient has prior history of Echocardiogram examinations, most                 recent 12/24/2020. CKD and Stroke; Risk Factors:Hypertension,                 Diabetes, Dyslipidemia  and Former Smoker.  Sonographer:    Wilkie Aye RVT RCS Referring Phys: [id number] [place]  Sonographer Comments: Technically challenging due to patient unable to stay awake; sleep apenea and respiratory motion. Some images may be off axis. IMPRESSIONS  1. Left ventricular ejection fraction, by estimation, is 55 to 60%. The left ventricle has normal function. The left ventricle has no regional wall motion  abnormalities. There is severe concentric left ventricular hypertrophy. Left ventricular diastolic  parameters are consistent with Grade II diastolic dysfunction (pseudonormalization).  2. Right ventricular systolic function is normal. The right ventricular size is normal. Tricuspid regurgitation signal is inadequate for assessing PA pressure.  3. Right atrial size was mildly dilated.  4. The mitral valve is normal in structure. Trivial mitral valve regurgitation. No evidence of mitral stenosis.  5. The aortic valve is tricuspid. There is moderate calcification of the aortic valve. Aortic valve regurgitation is not visualized. No aortic stenosis is present.  6. The inferior vena cava is dilated in size with >50% respiratory variability, suggesting right atrial pressure of 8 mmHg.  7. Degree of LVH is concerning for cardiac amyloidosis versus long-standing severe hypertension.  8. Left pleural effusion noted. Comparison(s): EF 60-65%. FINDINGS  Left Ventricle: Left ventricular ejection fraction, by estimation, is 55 to 60%. The left ventricle has normal function. The left ventricle has no regional wall motion abnormalities. The left ventricular internal cavity size was normal in size. There is  severe concentric left ventricular hypertrophy. Left ventricular diastolic parameters are consistent with Grade II diastolic dysfunction (pseudonormalization). Right Ventricle: The right ventricular size is normal. No increase in right ventricular wall thickness. Right ventricular systolic function is normal. Tricuspid regurgitation signal is inadequate for assessing PA pressure. Left Atrium: Left atrial size was normal in size. Right Atrium: Right atrial size was mildly dilated. Pericardium: Left pleural effusion noted. Trivial pericardial effusion is present. Mitral Valve: The mitral valve is normal in structure. Trivial mitral valve regurgitation. No evidence of mitral valve stenosis. Tricuspid Valve: The tricuspid valve is  normal in structure. Tricuspid valve regurgitation is not demonstrated. Aortic Valve: The aortic valve is tricuspid. There is moderate calcification of the aortic valve. Aortic valve regurgitation is not visualized. No aortic stenosis is present. Aortic valve mean gradient measures 6.0 mmHg. Aortic valve peak gradient measures 11.4 mmHg. Aortic valve area, by VTI measures 2.38 cm. Pulmonic Valve: The pulmonic valve was normal in structure. Pulmonic valve regurgitation is trivial. Aorta: The aortic root is normal in size and structure. Venous: The inferior vena cava is dilated in size with greater than 50% respiratory variability, suggesting right atrial pressure of 8 mmHg. IAS/Shunts: No atrial level shunt detected by color flow Doppler.  LEFT VENTRICLE PLAX 2D LVIDd:         4.60 cm   Diastology LVIDs:         2.80 cm   LV e' medial:    4.72 cm/s LV PW:         1.80 cm   LV E/e' medial:  22.9 LV IVS:        2.30 cm   LV e' lateral:   5.40 cm/s LVOT diam:     2.20 cm   LV E/e' lateral: 20.0 LV SV:         70 LV SV Index:   31 LVOT Area:     3.80 cm  RIGHT VENTRICLE             IVC RV Basal  diam:  3.80 cm     IVC diam: 2.30 cm RV Mid diam:    2.40 cm RV S prime:     13.30 cm/s TAPSE (M-mode): 1.8 cm LEFT ATRIUM           Index        RIGHT ATRIUM           Index LA diam:      4.70 cm 2.08 cm/m   RA Area:     20.20 cm LA Vol (A2C): 70.4 ml 31.12 ml/m  RA Volume:   58.50 ml  25.86 ml/m LA Vol (A4C): 49.3 ml 21.79 ml/m  AORTIC VALVE                     PULMONIC VALVE AV Area (Vmax):    2.50 cm      PV Vmax:       0.81 m/s AV Area (Vmean):   2.21 cm      PV Peak grad:  2.6 mmHg AV Area (VTI):     2.38 cm AV Vmax:           169.00 cm/s AV Vmean:          116.000 cm/s AV VTI:            0.296 m AV Peak Grad:      11.4 mmHg AV Mean Grad:      6.0 mmHg LVOT Vmax:         111.00 cm/s LVOT Vmean:        67.300 cm/s LVOT VTI:          0.185 m LVOT/AV VTI ratio: 0.62  AORTA Ao Root diam: 3.20 cm Ao Asc diam:  3.50 cm  MITRAL VALVE MV Area (PHT): 4.49 cm     SHUNTS MV Decel Time: 169 msec     Systemic VTI:  0.18 m MV E velocity: 108.00 cm/s  Systemic Diam: 2.20 cm MV A velocity: 40.70 cm/s MV E/A ratio:  2.65 Dalton McleanMD Electronically signed by Franki Monte Signature Date/Time: 05/06/2022/12:52:35 PM    Final    DG CHEST PORT 1 VIEW  Result Date: 05/06/2022 CLINICAL DATA:  Congestive heart failure. EXAM: PORTABLE CHEST 1 VIEW COMPARISON:  05/05/2022 FINDINGS: Stable cardiomegaly. Symmetric bilateral lower lung airspace disease shows no significant change. Small layering bilateral pleural effusions cannot be excluded. IMPRESSION: No significant change in cardiomegaly and bilateral lower lung airspace disease. Possible small layering bilateral pleural effusions. Electronically Signed   By: Marlaine Hind M.D.   On: 05/06/2022 08:03   CT HEAD WO CONTRAST (5MM)  Result Date: 05/06/2022 CLINICAL DATA:  Intracranial hemorrhage follow up EXAM: CT HEAD WITHOUT CONTRAST TECHNIQUE: Contiguous axial images were obtained from the base of the skull through the vertex without intravenous contrast. RADIATION DOSE REDUCTION: This exam was performed according to the departmental dose-optimization program which includes automated exposure control, adjustment of the mA and/or kV according to patient size and/or use of iterative reconstruction technique. COMPARISON:  05/05/2022 FINDINGS: Brain: Unchanged size of intraparenchymal hematoma in the right thalamus. No new hemorrhage. Brain parenchyma otherwise normal. Vascular: No hyperdense vessel or unexpected calcification. Skull: Normal Sinuses/Orbits: Negative Other: None IMPRESSION: Unchanged size of intraparenchymal hematoma in the right thalamus. No new hemorrhage. Electronically Signed   By: Ulyses Jarred M.D.   On: 05/06/2022 01:58   CT Head Wo Contrast  Result Date: 05/05/2022 CLINICAL DATA:  Left-sided weakness and facial droop EXAM: CT HEAD WITHOUT  CONTRAST TECHNIQUE: Contiguous  axial images were obtained from the base of the skull through the vertex without intravenous contrast. RADIATION DOSE REDUCTION: This exam was performed according to the departmental dose-optimization program which includes automated exposure control, adjustment of the mA and/or kV according to patient size and/or use of iterative reconstruction technique. COMPARISON:  None Available. FINDINGS: Brain: Acute intraparenchymal hemorrhage in the right thalamus measuring 2.1 x 1.8 x 1.4 cm. Mild edema. Parenchyma is otherwise normal. No hydrocephalus or mass effect. Vascular: No hyperdense vessel or unexpected calcification. Skull: Normal. Negative for fracture or focal lesion. Sinuses/Orbits: No acute finding. Other: None. IMPRESSION: Acute hypertensive type hemorrhage in the right thalamus measuring 2.6 mL. Critical Value/emergent results were called by telephone at the time of interpretation on 05/05/2022 at 7:52 pm to provider Margaretmary Eddy , who verbally acknowledged these results. Electronically Signed   By: Ulyses Jarred M.D.   On: 05/05/2022 19:52   DG Chest Portable 1 View  Result Date: 05/05/2022 CLINICAL DATA:  Stroke, shortness of breath, chest pain, hypertension EXAM: PORTABLE CHEST 1 VIEW COMPARISON:  03/06/2022 FINDINGS: Cardiomegaly, vascular congestion. Perihilar and lower lobe airspace opacities. Possible small effusions. No acute bony abnormality. IMPRESSION: Cardiomegaly with vascular congestion and perihilar/lower lobe opacities. This could reflect edema. Suspect small layering effusions. Electronically Signed   By: Rolm Baptise M.D.   On: 05/05/2022 19:13     Subjective: No acute issues/events overnight   Discharge Exam: Vitals:   05/14/22 0358 05/14/22 0721  BP: (!) 153/86 (!) 159/108  Pulse: 70 83  Resp: 20 16  Temp: 98 F (36.7 C) 98 F (36.7 C)  SpO2: 98% 96%   Vitals:   05/14/22 0013 05/14/22 0358 05/14/22 0500 05/14/22 0721  BP: (!) 150/84 (!) 153/86  (!) 159/108  Pulse:  72 70  83  Resp: 20 20  16  $ Temp: 98.4 F (36.9 C) 98 F (36.7 C)  98 F (36.7 C)  TempSrc: Oral Oral  Oral  SpO2: 98% 98%  96%  Weight:   93.1 kg   Height:       General:  Pleasantly resting in bed, No acute distress. HEENT:  Normocephalic atraumatic.  Sclerae nonicteric, noninjected.  Extraocular movements intact bilaterally. Neck:  Without mass or deformity.  Trachea is midline. Lungs:  Clear to auscultate bilaterally without rhonchi, wheeze, or rales. Heart:  Regular rate and rhythm.  Without murmurs, rubs, or gallops. Abdomen:  Soft, nontender, nondistended.  Without guarding or rebound. Extremities: Without cyanosis, clubbing, edema, or obvious deformity. Vascular:  Dorsalis pedis and posterior tibial pulses palpable bilaterally. Skin:  Warm and dry, no erythema, no rashes   The results of significant diagnostics from this hospitalization (including imaging, microbiology, ancillary and laboratory) are listed below for reference.     Microbiology: Recent Results (from the past 240 hour(s))  MRSA Next Gen by PCR, Nasal     Status: None   Collection Time: 05/05/22 10:09 PM   Specimen: Nasal Mucosa; Nasal Swab  Result Value Ref Range Status   MRSA by PCR Next Gen NOT DETECTED NOT DETECTED Final    Comment: (NOTE) The GeneXpert MRSA Assay (FDA approved for NASAL specimens only), is one component of a comprehensive MRSA colonization surveillance program. It is not intended to diagnose MRSA infection nor to guide or monitor treatment for MRSA infections. Test performance is not FDA approved in patients less than 51 years old. Performed at [facility], [place] [address], [place], [place] [address]  Labs: BNP (last 3 results) Recent Labs    03/03/22 0949 05/05/22 1900  BNP 642.8* [id number]*   Basic Metabolic Panel: Recent Labs  Lab 05/08/22 0540 05/09/22 0311 05/12/22 0551  NA 139 135 138  K 3.9 4.2 4.2  CL 106 103 102  CO2 21* 23 22  GLUCOSE 116* 119*  119*  BUN 49* 50* 65*  CREATININE 3.26* 3.53* 3.47*  CALCIUM 7.6* 8.0* 8.4*  MG  --  1.9  --    Liver Function Tests: No results for input(s): "AST", "ALT", "ALKPHOS", "BILITOT", "PROT", "ALBUMIN" in the last 168 hours. No results for input(s): "LIPASE", "AMYLASE" in the last 168 hours. No results for input(s): "AMMONIA" in the last 168 hours. CBC: Recent Labs  Lab 05/08/22 0540 05/09/22 0311 05/12/22 0551  WBC 12.2* 12.6* 9.2  NEUTROABS 9.6* 9.7* 6.2  HGB 10.5* 10.8* 10.2*  HCT 33.5* 34.0* 32.3*  MCV 85.5 85.0 83.0  PLT 241 227 245   Cardiac Enzymes: No results for input(s): "CKTOTAL", "CKMB", "CKMBINDEX", "TROPONINI" in the last 168 hours. BNP: Invalid input(s): "POCBNP" CBG: Recent Labs  Lab 05/13/22 0610 05/13/22 1118 05/13/22 1610 05/13/22 2125 05/14/22 0604  GLUCAP 102* 208* 151* 141* 118*   D-Dimer No results for input(s): "DDIMER" in the last 72 hours. Hgb A1c No results for input(s): "HGBA1C" in the last 72 hours. Lipid Profile No results for input(s): "CHOL", "HDL", "LDLCALC", "TRIG", "CHOLHDL", "LDLDIRECT" in the last 72 hours. Thyroid function studies No results for input(s): "TSH", "T4TOTAL", "T3FREE", "THYROIDAB" in the last 72 hours.  Invalid input(s): "FREET3" Anemia work up No results for input(s): "VITAMINB12", "FOLATE", "FERRITIN", "TIBC", "IRON", "RETICCTPCT" in the last 72 hours. Urinalysis    Component Value Date/Time   COLORURINE YELLOW 08/13/2020 2221   APPEARANCEUR CLEAR 08/13/2020 2221   LABSPEC 1.017 08/13/2020 2221   PHURINE 5.0 08/13/2020 2221   GLUCOSEU 50 (A) 08/13/2020 2221   HGBUR NEGATIVE 08/13/2020 2221   BILIRUBINUR NEGATIVE 08/13/2020 2221   KETONESUR NEGATIVE 08/13/2020 2221   PROTEINUR 100 (A) 08/13/2020 2221   UROBILINOGEN 0.2 07/12/2008 1023   NITRITE NEGATIVE 08/13/2020 2221   LEUKOCYTESUR NEGATIVE 08/13/2020 2221   Sepsis Labs Recent Labs  Lab 05/08/22 0540 05/09/22 0311 05/12/22 0551  WBC 12.2* 12.6*  9.2   Microbiology Recent Results (from the past 240 hour(s))  MRSA Next Gen by PCR, Nasal     Status: None   Collection Time: 05/05/22 10:09 PM   Specimen: Nasal Mucosa; Nasal Swab  Result Value Ref Range Status   MRSA by PCR Next Gen NOT DETECTED NOT DETECTED Final    Comment: (NOTE) The GeneXpert MRSA Assay (FDA approved for NASAL specimens only), is one component of a comprehensive MRSA colonization surveillance program. It is not intended to diagnose MRSA infection nor to guide or monitor treatment for MRSA infections. Test performance is not FDA approved in patients less than 47 years old. Performed at [facility], [address] [place], [place] [address]      Time coordinating discharge: Over 30 minutes  SIGNED:   Little Ishikawa, DO [facility] 05/14/2022, 9:27 AM Pager   If 7PM-7AM, please contact night-coverage [email or web address]

## 2022-05-17 DIAGNOSIS — K219 Gastro-esophageal reflux disease without esophagitis: Secondary | ICD-10-CM | POA: Diagnosis not present

## 2022-05-17 DIAGNOSIS — I10 Essential (primary) hypertension: Secondary | ICD-10-CM | POA: Diagnosis not present

## 2022-05-17 DIAGNOSIS — G936 Cerebral edema: Secondary | ICD-10-CM | POA: Diagnosis not present

## 2022-05-17 DIAGNOSIS — G319 Degenerative disease of nervous system, unspecified: Secondary | ICD-10-CM | POA: Diagnosis not present

## 2022-05-17 DIAGNOSIS — R29818 Other symptoms and signs involving the nervous system: Secondary | ICD-10-CM | POA: Diagnosis not present

## 2022-05-17 DIAGNOSIS — R5381 Other malaise: Secondary | ICD-10-CM | POA: Diagnosis not present

## 2022-05-17 DIAGNOSIS — D649 Anemia, unspecified: Secondary | ICD-10-CM | POA: Diagnosis not present

## 2022-05-17 DIAGNOSIS — M109 Gout, unspecified: Secondary | ICD-10-CM | POA: Diagnosis not present

## 2022-05-17 DIAGNOSIS — N184 Chronic kidney disease, stage 4 (severe): Secondary | ICD-10-CM | POA: Diagnosis not present

## 2022-05-17 DIAGNOSIS — I639 Cerebral infarction, unspecified: Secondary | ICD-10-CM | POA: Diagnosis not present

## 2022-05-17 DIAGNOSIS — I5032 Chronic diastolic (congestive) heart failure: Secondary | ICD-10-CM | POA: Diagnosis not present

## 2022-05-20 ENCOUNTER — Other Ambulatory Visit: Payer: Self-pay

## 2022-05-21 DIAGNOSIS — R29818 Other symptoms and signs involving the nervous system: Secondary | ICD-10-CM | POA: Diagnosis not present

## 2022-05-21 DIAGNOSIS — I639 Cerebral infarction, unspecified: Secondary | ICD-10-CM | POA: Diagnosis not present

## 2022-05-21 DIAGNOSIS — R5381 Other malaise: Secondary | ICD-10-CM | POA: Diagnosis not present

## 2022-05-29 DIAGNOSIS — F411 Generalized anxiety disorder: Secondary | ICD-10-CM | POA: Diagnosis not present

## 2022-05-31 DIAGNOSIS — R079 Chest pain, unspecified: Secondary | ICD-10-CM | POA: Diagnosis not present

## 2022-05-31 DIAGNOSIS — I5032 Chronic diastolic (congestive) heart failure: Secondary | ICD-10-CM | POA: Diagnosis not present

## 2022-05-31 DIAGNOSIS — E1143 Type 2 diabetes mellitus with diabetic autonomic (poly)neuropathy: Secondary | ICD-10-CM | POA: Diagnosis not present

## 2022-05-31 DIAGNOSIS — I161 Hypertensive emergency: Secondary | ICD-10-CM | POA: Diagnosis not present

## 2022-05-31 DIAGNOSIS — E785 Hyperlipidemia, unspecified: Secondary | ICD-10-CM | POA: Diagnosis not present

## 2022-05-31 DIAGNOSIS — J45909 Unspecified asthma, uncomplicated: Secondary | ICD-10-CM | POA: Diagnosis not present

## 2022-05-31 DIAGNOSIS — I10 Essential (primary) hypertension: Secondary | ICD-10-CM | POA: Diagnosis not present

## 2022-05-31 DIAGNOSIS — I619 Nontraumatic intracerebral hemorrhage, unspecified: Secondary | ICD-10-CM | POA: Diagnosis not present

## 2022-06-04 ENCOUNTER — Encounter: Payer: Self-pay | Admitting: Oncology

## 2022-06-04 ENCOUNTER — Ambulatory Visit (HOSPITAL_BASED_OUTPATIENT_CLINIC_OR_DEPARTMENT_OTHER): Payer: Medicaid Other | Admitting: Family

## 2022-06-04 DIAGNOSIS — K921 Melena: Secondary | ICD-10-CM | POA: Diagnosis not present

## 2022-06-05 DIAGNOSIS — Z85048 Personal history of other malignant neoplasm of rectum, rectosigmoid junction, and anus: Secondary | ICD-10-CM | POA: Diagnosis not present

## 2022-06-05 DIAGNOSIS — K921 Melena: Secondary | ICD-10-CM | POA: Diagnosis not present

## 2022-06-07 DIAGNOSIS — K625 Hemorrhage of anus and rectum: Secondary | ICD-10-CM | POA: Diagnosis not present

## 2022-06-07 DIAGNOSIS — D649 Anemia, unspecified: Secondary | ICD-10-CM | POA: Diagnosis not present

## 2022-06-07 DIAGNOSIS — Z85048 Personal history of other malignant neoplasm of rectum, rectosigmoid junction, and anus: Secondary | ICD-10-CM | POA: Diagnosis not present

## 2022-06-14 DIAGNOSIS — D649 Anemia, unspecified: Secondary | ICD-10-CM | POA: Diagnosis not present

## 2022-06-14 DIAGNOSIS — Z85048 Personal history of other malignant neoplasm of rectum, rectosigmoid junction, and anus: Secondary | ICD-10-CM | POA: Diagnosis not present

## 2022-06-14 DIAGNOSIS — K625 Hemorrhage of anus and rectum: Secondary | ICD-10-CM | POA: Diagnosis not present

## 2022-06-17 DIAGNOSIS — E119 Type 2 diabetes mellitus without complications: Secondary | ICD-10-CM | POA: Diagnosis not present

## 2022-06-17 DIAGNOSIS — Z79899 Other long term (current) drug therapy: Secondary | ICD-10-CM | POA: Diagnosis not present

## 2022-06-19 ENCOUNTER — Telehealth: Payer: Self-pay

## 2022-06-19 ENCOUNTER — Other Ambulatory Visit: Payer: Self-pay

## 2022-06-19 DIAGNOSIS — D649 Anemia, unspecified: Secondary | ICD-10-CM | POA: Diagnosis not present

## 2022-06-19 DIAGNOSIS — K6289 Other specified diseases of anus and rectum: Secondary | ICD-10-CM

## 2022-06-19 DIAGNOSIS — Z85048 Personal history of other malignant neoplasm of rectum, rectosigmoid junction, and anus: Secondary | ICD-10-CM | POA: Diagnosis not present
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
## 2022-06-21 DIAGNOSIS — E785 Hyperlipidemia, unspecified: Secondary | ICD-10-CM | POA: Diagnosis not present

## 2022-06-21 DIAGNOSIS — D649 Anemia, unspecified: Secondary | ICD-10-CM | POA: Diagnosis not present

## 2022-06-21 DIAGNOSIS — R5381 Other malaise: Secondary | ICD-10-CM | POA: Diagnosis not present

## 2022-06-21 DIAGNOSIS — E119 Type 2 diabetes mellitus without complications: Secondary | ICD-10-CM | POA: Diagnosis not present

## 2022-06-21 DIAGNOSIS — R29818 Other symptoms and signs involving the nervous system: Secondary | ICD-10-CM | POA: Diagnosis not present

## 2022-06-21 DIAGNOSIS — I69354 Hemiplegia and hemiparesis following cerebral infarction affecting left non-dominant side: Secondary | ICD-10-CM | POA: Diagnosis not present

## 2022-06-21 DIAGNOSIS — I5032 Chronic diastolic (congestive) heart failure: Secondary | ICD-10-CM | POA: Diagnosis not present

## 2022-06-21 DIAGNOSIS — G319 Degenerative disease of nervous system, unspecified: Secondary | ICD-10-CM | POA: Diagnosis not present

## 2022-06-21 DIAGNOSIS — K219 Gastro-esophageal reflux disease without esophagitis: Secondary | ICD-10-CM | POA: Diagnosis not present

## 2022-06-21 DIAGNOSIS — N184 Chronic kidney disease, stage 4 (severe): Secondary | ICD-10-CM | POA: Diagnosis not present

## 2022-06-21 DIAGNOSIS — I639 Cerebral infarction, unspecified: Secondary | ICD-10-CM | POA: Diagnosis not present

## 2022-06-21 DIAGNOSIS — I10 Essential (primary) hypertension: Secondary | ICD-10-CM | POA: Diagnosis not present

## 2022-06-26 DIAGNOSIS — D649 Anemia, unspecified: Secondary | ICD-10-CM | POA: Diagnosis not present

## 2022-06-26 DIAGNOSIS — Z85048 Personal history of other malignant neoplasm of rectum, rectosigmoid junction, and anus: Secondary | ICD-10-CM | POA: Diagnosis not present

## 2022-06-28 ENCOUNTER — Encounter (HOSPITAL_BASED_OUTPATIENT_CLINIC_OR_DEPARTMENT_OTHER): Payer: Self-pay | Admitting: Family

## 2022-06-28 ENCOUNTER — Ambulatory Visit (HOSPITAL_BASED_OUTPATIENT_CLINIC_OR_DEPARTMENT_OTHER): Payer: Medicaid Other | Admitting: Family

## 2022-06-28 ENCOUNTER — Ambulatory Visit (INDEPENDENT_AMBULATORY_CARE_PROVIDER_SITE_OTHER): Payer: Medicaid Other | Admitting: Family

## 2022-06-28 ENCOUNTER — Ambulatory Visit (HOSPITAL_BASED_OUTPATIENT_CLINIC_OR_DEPARTMENT_OTHER): Payer: Medicaid Other

## 2022-06-28 VITALS — BP 124/80 | HR 64 | Ht 73.0 in | Wt 219.2 lb

## 2022-06-28 DIAGNOSIS — N184 Chronic kidney disease, stage 4 (severe): Secondary | ICD-10-CM

## 2022-06-28 DIAGNOSIS — I5032 Chronic diastolic (congestive) heart failure: Secondary | ICD-10-CM | POA: Diagnosis not present

## 2022-06-28 DIAGNOSIS — I10 Essential (primary) hypertension: Secondary | ICD-10-CM | POA: Diagnosis not present
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
## 2022-06-28 NOTE — Patient Instructions (Signed)
Medication Instructions:  Your physician recommends that you continue on your current medications as directed. Please refer to the Current Medication list given to you today.  *If you need a refill on your cardiac medications before your next appointment, please call your pharmacy*   Lab Work: Please draw a BMP with patient's next draw   Follow-Up: At [facility], you and your health needs are our priority.  As part of our continuing mission to provide you with exceptional heart care, we have created designated Provider Care Teams.  These Care Teams include your primary Cardiologist (physician) and Advanced Practice Providers (APPs -  Physician Assistants and Nurse Practitioners) who all work together to provide you with the care you need, when you need it.  We recommend signing up for the patient portal called "MyChart".  Sign up information is provided on this After Visit Summary.  MyChart is used to connect with patients for Virtual Visits (Telemedicine).  Patients are able to view lab/test results, encounter notes, upcoming appointments, etc.  Non-urgent messages can be sent to your provider as well.   To learn more about what you can do with MyChart, go to [email or web address].    Your next appointment:   2 month(s)  Provider:   Skeet Latch, MD or Laurann Montana, NP

## 2022-07-01 ENCOUNTER — Encounter (HOSPITAL_BASED_OUTPATIENT_CLINIC_OR_DEPARTMENT_OTHER): Payer: Self-pay | Admitting: Family

## 2022-07-01 DIAGNOSIS — J45909 Unspecified asthma, uncomplicated: Secondary | ICD-10-CM | POA: Diagnosis not present

## 2022-07-01 DIAGNOSIS — I10 Essential (primary) hypertension: Secondary | ICD-10-CM | POA: Diagnosis not present

## 2022-07-01 DIAGNOSIS — I161 Hypertensive emergency: Secondary | ICD-10-CM | POA: Diagnosis not present

## 2022-07-01 DIAGNOSIS — E1143 Type 2 diabetes mellitus with diabetic autonomic (poly)neuropathy: Secondary | ICD-10-CM | POA: Diagnosis not present

## 2022-07-01 DIAGNOSIS — I5032 Chronic diastolic (congestive) heart failure: Secondary | ICD-10-CM | POA: Diagnosis not present

## 2022-07-01 DIAGNOSIS — R079 Chest pain, unspecified: Secondary | ICD-10-CM | POA: Diagnosis not present

## 2022-07-01 DIAGNOSIS — E785 Hyperlipidemia, unspecified: Secondary | ICD-10-CM | POA: Diagnosis not present

## 2022-07-01 DIAGNOSIS — I619 Nontraumatic intracerebral hemorrhage, unspecified: Secondary | ICD-10-CM | POA: Diagnosis not present

## 2022-07-02 DIAGNOSIS — M109 Gout, unspecified: Secondary | ICD-10-CM | POA: Diagnosis not present

## 2022-07-03 DIAGNOSIS — K921 Melena: Secondary | ICD-10-CM | POA: Diagnosis not present

## 2022-07-03 DIAGNOSIS — D649 Anemia, unspecified: Secondary | ICD-10-CM | POA: Diagnosis not present

## 2022-07-03 DIAGNOSIS — Z85048 Personal history of other malignant neoplasm of rectum, rectosigmoid junction, and anus: Secondary | ICD-10-CM | POA: Diagnosis not present

## 2022-07-10 DIAGNOSIS — D649 Anemia, unspecified: Secondary | ICD-10-CM | POA: Diagnosis not present

## 2022-07-10 DIAGNOSIS — Z85048 Personal history of other malignant neoplasm of rectum, rectosigmoid junction, and anus: Secondary | ICD-10-CM | POA: Diagnosis not present

## 2022-07-10 DIAGNOSIS — K921 Melena: Secondary | ICD-10-CM | POA: Diagnosis not present

## 2022-07-12 DIAGNOSIS — K219 Gastro-esophageal reflux disease without esophagitis: Secondary | ICD-10-CM | POA: Diagnosis not present

## 2022-07-12 DIAGNOSIS — I5032 Chronic diastolic (congestive) heart failure: Secondary | ICD-10-CM | POA: Diagnosis not present

## 2022-07-12 DIAGNOSIS — G936 Cerebral edema: Secondary | ICD-10-CM | POA: Diagnosis not present

## 2022-07-12 DIAGNOSIS — I639 Cerebral infarction, unspecified: Secondary | ICD-10-CM | POA: Diagnosis not present

## 2022-07-12 DIAGNOSIS — G319 Degenerative disease of nervous system, unspecified: Secondary | ICD-10-CM | POA: Diagnosis not present

## 2022-07-12 DIAGNOSIS — R29818 Other symptoms and signs involving the nervous system: Secondary | ICD-10-CM | POA: Diagnosis not present

## 2022-07-12 DIAGNOSIS — N184 Chronic kidney disease, stage 4 (severe): Secondary | ICD-10-CM | POA: Diagnosis not present

## 2022-07-12 DIAGNOSIS — I10 Essential (primary) hypertension: Secondary | ICD-10-CM | POA: Diagnosis not present

## 2022-07-12 DIAGNOSIS — D649 Anemia, unspecified: Secondary | ICD-10-CM | POA: Diagnosis not present

## 2022-07-12 DIAGNOSIS — M109 Gout, unspecified: Secondary | ICD-10-CM | POA: Diagnosis not present

## 2022-07-12 DIAGNOSIS — R5381 Other malaise: Secondary | ICD-10-CM | POA: Diagnosis not present

## 2022-07-22 ENCOUNTER — Telehealth: Payer: Self-pay | Admitting: *Deleted

## 2022-07-22 NOTE — Telephone Encounter (Signed)
Called patient to see if he would still like to participate in the sleep study, he said is good with his sleep he would not like to take the test. Advised patient he will need to return the device unopened by the end of week!   Will make provider aware.   Upon return of device we will cancel order and cancel device registration.

## 2022-07-22 NOTE — Telephone Encounter (Signed)
Will discuss and reassess at next follow up  Alver Sorrow, NP

## 2022-07-22 NOTE — Telephone Encounter (Signed)
Patient has not done itamar device given to him in January. Ordering provider will be notified for follow up.

## 2022-07-24 ENCOUNTER — Inpatient Hospital Stay: Payer: Medicaid Other | Attending: Oncology

## 2022-07-24 ENCOUNTER — Ambulatory Visit (HOSPITAL_BASED_OUTPATIENT_CLINIC_OR_DEPARTMENT_OTHER)
Admission: RE | Admit: 2022-07-24 | Discharge: 2022-07-24 | Disposition: A | Discharge status: Home / Self Care | Payer: Medicaid Other | Source: Ambulatory Visit | Attending: Nurse Practitioner | Admitting: Nurse Practitioner

## 2022-07-24 DIAGNOSIS — N189 Chronic kidney disease, unspecified: Secondary | ICD-10-CM | POA: Diagnosis not present

## 2022-07-24 DIAGNOSIS — C20 Malignant neoplasm of rectum: Secondary | ICD-10-CM | POA: Insufficient documentation

## 2022-07-24 DIAGNOSIS — I700 Atherosclerosis of aorta: Secondary | ICD-10-CM | POA: Diagnosis not present

## 2022-07-24 DIAGNOSIS — D649 Anemia, unspecified: Secondary | ICD-10-CM | POA: Diagnosis not present

## 2022-07-24 DIAGNOSIS — Z85048 Personal history of other malignant neoplasm of rectum, rectosigmoid junction, and anus: Secondary | ICD-10-CM | POA: Diagnosis not present

## 2022-07-24 DIAGNOSIS — R590 Localized enlarged lymph nodes: Secondary | ICD-10-CM | POA: Diagnosis not present

## 2022-07-24 DIAGNOSIS — K6289 Other specified diseases of anus and rectum: Secondary | ICD-10-CM

## 2022-07-24 LAB — CMP (CANCER CENTER ONLY)
ALT: 8 U/L (ref 0–44)
AST: 14 U/L — ABNORMAL LOW (ref 15–41)
Albumin: 4.1 g/dL (ref 3.5–5.0)
Alkaline Phosphatase: 110 U/L (ref 38–126)
Anion gap: 12 (ref 5–15)
BUN: 69 mg/dL — ABNORMAL HIGH (ref 6–20)
CO2: 25 mmol/L (ref 22–32)
Calcium: 8.8 mg/dL — ABNORMAL LOW (ref 8.9–10.3)
Chloride: 102 mmol/L (ref 98–111)
Creatinine: 3.94 mg/dL — ABNORMAL HIGH (ref 0.61–1.24)
GFR, Estimated: 17 mL/min — ABNORMAL LOW (ref >60)
Glucose, Bld: 144 mg/dL — ABNORMAL HIGH (ref 70–99)
Potassium: 4 mmol/L (ref 3.5–5.1)
Sodium: 139 mmol/L (ref 135–145)
Total Bilirubin: 0.7 mg/dL (ref 0.3–1.2)
Total Protein: 7.8 g/dL (ref 6.5–8.1)

## 2022-07-24 LAB — CEA (ACCESS): CEA (CHCC): 1.97 ng/mL (ref 0.00–5.00)

## 2022-07-26 ENCOUNTER — Encounter (HOSPITAL_BASED_OUTPATIENT_CLINIC_OR_DEPARTMENT_OTHER): Payer: Self-pay

## 2022-07-26 ENCOUNTER — Other Ambulatory Visit: Payer: Medicaid Other

## 2022-07-26 ENCOUNTER — Telehealth: Payer: Self-pay | Admitting: Nurse Practitioner

## 2022-07-26 ENCOUNTER — Inpatient Hospital Stay: Payer: Medicaid Other | Admitting: Nurse Practitioner

## 2022-07-29 ENCOUNTER — Ambulatory Visit: Payer: Medicaid Other | Admitting: Diagnostic Neuroimaging

## 2022-07-29 ENCOUNTER — Encounter: Payer: Self-pay | Admitting: Diagnostic Neuroimaging

## 2022-07-31 ENCOUNTER — Inpatient Hospital Stay: Payer: Medicaid Other | Attending: Oncology

## 2022-07-31 ENCOUNTER — Inpatient Hospital Stay (HOSPITAL_BASED_OUTPATIENT_CLINIC_OR_DEPARTMENT_OTHER): Payer: Medicaid Other | Admitting: Nurse Practitioner

## 2022-07-31 ENCOUNTER — Other Ambulatory Visit: Payer: Self-pay

## 2022-07-31 ENCOUNTER — Telehealth: Payer: Self-pay | Admitting: *Deleted

## 2022-07-31 ENCOUNTER — Encounter: Payer: Self-pay | Admitting: Nurse Practitioner

## 2022-07-31 ENCOUNTER — Inpatient Hospital Stay: Payer: Medicaid Other | Admitting: Licensed Clinical Social Worker

## 2022-07-31 VITALS — BP 118/78 | HR 61 | Temp 98.1°F | Resp 18 | Ht 73.0 in | Wt 212.0 lb

## 2022-07-31 DIAGNOSIS — Z85048 Personal history of other malignant neoplasm of rectum, rectosigmoid junction, and anus: Secondary | ICD-10-CM | POA: Insufficient documentation

## 2022-07-31 DIAGNOSIS — C20 Malignant neoplasm of rectum: Secondary | ICD-10-CM

## 2022-07-31 DIAGNOSIS — Z08 Encounter for follow-up examination after completed treatment for malignant neoplasm: Secondary | ICD-10-CM | POA: Insufficient documentation

## 2022-07-31 NOTE — Progress Notes (Signed)
[facility] Clinical Social Work  Clinical Social Work was referred by medical provider for assessment of psychosocial needs.  Clinical Social Worker contacted patient by phone to offer support and assess for needs.    Patient requested assistance with housing.  He said he had a safe place to stay currently.  CSW provided information for [facility] [employer]) for additional assistance.  Plan is to follow up with patient.     Dorothey Baseman, LCSW  Clinical Social Worker [facility]        Patient is participating in a Managed Medicaid Plan:  Yes

## 2022-07-31 NOTE — Telephone Encounter (Signed)
  Chief Complaint: Information Symptoms: NA Frequency: NA Pertinent Negatives: Patient denies NA Disposition: [] ED /[] Urgent Care (no appt availability in office) / [] Appointment(In office/virtual)/ []  [place] Virtual Care/ [] Home Care/ [] Refused Recommended Disposition /[] [place] [facility]/ []  Follow-up with PCP Additional Notes:  Agent with [facility] calling to verify dx of diabetes, advised Type 2. States she is faxing forms to practice now. Requesting they be returned as soon as able as pt "Really needs the incontinence supplies."

## 2022-07-31 NOTE — Progress Notes (Signed)
Craig West OFFICE PROGRESS NOTE   Diagnosis: Rectal cancer  INTERVAL HISTORY:   Craig West returns as scheduled.  He was hospitalized 05/05/2022 through 05/14/2022 with a stroke/hypertensive emergency.  He reports improvement in the left sided numbness/weakness but does have persistent symptoms.  He is walking with a cane.  He had an episode of rectal bleeding when he was in rehab following the stroke.  No further episodes.  Bowels habits continue to be irregular.  Objective:  Vital signs in last 24 hours:  Blood pressure 118/78, pulse 61, temperature 98.1 F (36.7 C), temperature source Oral, resp. rate 18, height 6\' 1"  (1.854 m), weight 212 lb (96.2 kg), SpO2 100 %.    Lymphatics: No palpable cervical, supraclavicular, axillary or inguinal lymph nodes. Resp: Lungs clear bilaterally. Cardio: Regular rate and rhythm. GI: No hepatomegaly. Vascular: No leg edema. Neuro: Alert and oriented.   Lab Results:  Lab Results  Component Value Date   WBC 9.2 05/12/2022   HGB 10.2 (L) 05/12/2022   HCT 32.3 (L) 05/12/2022   MCV 83.0 05/12/2022   PLT 245 05/12/2022   NEUTROABS 6.2 05/12/2022    Imaging:  No results found.  Medications: I have reviewed the patient's current medications.  Assessment/Plan: Rectal cancer-rectal mass at 5-6 cm from the anal verge on colonoscopy 01/11/2020, biopsy confirmed adenocarcinoma CTs 01/11/2020-rectal thickening, small perirectal lymph nodes and external iliac nodes, moderate right pleural effusion MRI pelvis 01/27/2020-T3bN2b tumor at 7.1 cm from the internal anal sphincter, multiple mesorectal nodes, indistinct left pelvic sidewall node and enlarged bilateral external iliac nodes PET scan 02/11/2020-hypermetabolic rectosigmoid mass, hypermetabolic mesorectal and sigmoid mesocolon lymph nodes, left external iliac/obturator node with mild hypermetabolism, 18 mm left inguinal node with normal architecture and slight  hypermetabolism Radiation and infusional 5-FU 02/21/2020-04/04/2020 CTs 05/09/2020-decreased perirectal and sigmoid mesocolon lymphadenopathy, no evidence of disease progression MRI pelvis 05/17/2020-no well-defined residual rectal mass seen.  Wall appears slightly irregular.  No perirectal extension of tumor.  Again identified is perirectal adenopathy, similar to most recent CT 05/10/2020 but improved compared to PET/CT of 02/11/2020. Cycle 1 FOLFOX 06/14/2020 06/27/2020 chemotherapy held due to hypertension Cycle 2 FOLFOX 06/29/2020 Cycle 3 FOLFOX 07/13/2020 Cycle 4 FOLFOX 07/27/2020 Cycle 5 FOLFOX 08/09/2020 Cycle 6 FOLFOX 08/24/2020 Cycle 7 FOLFOX 09/07/2020 Cycle 8 FOLFOX 09/21/2020 MRI pelvis 10/16/2020-no residual rectal mass, small presacral/perirectal nodes improved from previous MRI 12/06/2020 open low anterior resection with diverting loop colostomy, Dr. Luciano Cutter; pathology-rectal tumor location entirely below anterior peritoneal reflection; adenocarcinoma; minimal residual tumor; invades submucosa; no macroscopic tumor perforation; no lymphovascular invasion; no perineural invasion; treatment effect present with single cells or rare small groups of cancer cells (near complete response, score 1); all margins negative for invasive carcinoma; 2 of 13 lymph nodes positive; pT1, pN1b 04/04/2021 transverse loop colostomy takedown CT chest 05/04/2021 (CT abdomen/pelvis October 2022)-no evidence of metastatic disease CTs 02/03/2022-no findings highly suspicious for recurrent metastatic disease.  New mild mediastinal lymphadenopathy is nonspecific.  Nonspecific mild right external iliac lymphadenopathy is unchanged. CT chest 07/24/2022-resolved mediastinal adenopathy. Anemia Chronic renal failure Congestive heart failure-severe LVH Diabetes Gout Peripheral neuropathy? Hypertension Fever/chills 01/03/2021-CT with air-fluid collection in the presacral space extending from the level of the anus to the level of S2  worrisome for abscess or contained perforation.  Transferred to [facility].  Drain placed.  Culture positive for E. coli.  Drain removed 01/07/2021.  Repeat CT 01/08/2021 showed near resolution of the fluid component of presacral collection with persistent thick soft tissue rind  not amenable to drainage.  Drain was not replaced. Influenza A 03/01/2021  Disposition: Craig West remains in clinical remission from rectal cancer.  The recent chest CT shows resolution of mediastinal adenopathy.  CEA is in normal range.  Plan for surveillance CT scans in 6 months.  He thinks he has had a colonoscopy recently but I am unable to find documentation of this in the EMR.  He had a proctoscopy with Dr. Reyne Dumas at [facility] in January.  Referral made to his gastroenterologist.  He will return for a CEA and CT scans in 6 months.  We will see him a few days later to review results.    Lonna Cobb ANP/GNP-BC   07/31/2022  9:48 AM

## 2022-07-31 NOTE — Telephone Encounter (Signed)
Noted  

## 2022-08-01 ENCOUNTER — Inpatient Hospital Stay: Payer: Medicaid Other

## 2022-08-01 ENCOUNTER — Telehealth: Payer: Self-pay | Admitting: Family Medicine

## 2022-08-01 NOTE — Telephone Encounter (Signed)
Fax has been received and will be sent once complete.

## 2022-08-01 NOTE — Telephone Encounter (Signed)
Dianne with [employer] is calling to see if PCP received the fax sent over on 07/23/22 for personal care services for pt. Craig West states that she will re fax the paper work over today and is wanting to see if it can be filled out and sent back to her. Please advise.  Dianne Phone Number: [phone]

## 2022-08-01 NOTE — Progress Notes (Signed)
[facility] CSW Progress Note  Clinical Child psychotherapist contacted patient by phone to assess needs.  Patient stated he contacted [facility] and they were unable to assist him.  He confirmed he is receiving disability.  Per patient's request, emailed him several [place] resources and the number to register as homeless Engineer, site [phone]).  Informed him of CSW role and he stated he understood.    Dorothey Baseman, LCSW Clinical Social Worker [facility]    Patient is participating in a Managed Medicaid Plan:  Yes

## 2022-08-05 ENCOUNTER — Other Ambulatory Visit: Payer: Self-pay

## 2022-08-05 ENCOUNTER — Encounter: Payer: Self-pay | Admitting: Family Medicine

## 2022-08-05 ENCOUNTER — Ambulatory Visit: Payer: Medicaid Other | Attending: Family Medicine | Admitting: Family Medicine

## 2022-08-05 VITALS — BP 123/74 | HR 60 | Ht 73.0 in | Wt 214.8 lb

## 2022-08-05 DIAGNOSIS — I129 Hypertensive chronic kidney disease with stage 1 through stage 4 chronic kidney disease, or unspecified chronic kidney disease: Secondary | ICD-10-CM

## 2022-08-05 DIAGNOSIS — I69354 Hemiplegia and hemiparesis following cerebral infarction affecting left non-dominant side: Secondary | ICD-10-CM

## 2022-08-05 DIAGNOSIS — E1142 Type 2 diabetes mellitus with diabetic polyneuropathy: Secondary | ICD-10-CM

## 2022-08-05 DIAGNOSIS — I110 Hypertensive heart disease with heart failure: Secondary | ICD-10-CM | POA: Diagnosis not present

## 2022-08-05 DIAGNOSIS — I5032 Chronic diastolic (congestive) heart failure: Secondary | ICD-10-CM | POA: Diagnosis not present

## 2022-08-05 DIAGNOSIS — E1122 Type 2 diabetes mellitus with diabetic chronic kidney disease: Secondary | ICD-10-CM | POA: Diagnosis not present

## 2022-08-05 MED ORDER — GABAPENTIN 300 MG PO CAPS
300.0000 mg | ORAL_CAPSULE | Freq: Every day | ORAL | 1 refills | Status: DC
Start: 2022-08-05 — End: 2022-12-04
  Filled 2022-08-05: qty 90, 90d supply, fill #0

## 2022-08-05 NOTE — Progress Notes (Signed)
Subjective:  Patient ID: Craig West, male    DOB: September 08, 1965  Age: 57 y.o. MRN: [id number]  CC: Hypertension   HPI Craig West is a 57 y.o. year old male with a history of hypertension, type 2 diabetes mellitus (A1c 5.8, diet controlled), asthma, gout, stage IV chronic kidney disease, Hypertensive Heart Disease, Rectal carcinoma diagnosed in 12/2018 (completed chemotherapy and radiation), s/p Low anterior resection with diverting Colostomy at [facility] on 12/06/20 with subsequent reversal of colostomy here for a follow up visit.   Interval History: In 05/2022 he was hospitalized for hypertensive emergency and nontraumatic intracerebral hemorrhage after he had presented with left-sided weakness and CT scan revealed hemorrhagic stroke thought to be ischemic with conversion per notes. His hypertension is managed by the cardiology advanced hypertension clinic.  He still has residual left arm numbness, he also ambulates with a cane on some occasions. He is states he is on disability and gets a check every month. At the moment he is applying for low income housing and needs a letter stating he is disabled.  He has not been to see Nephrology in a while.  For his diabetes he remains on diet control. Past Medical History:  Diagnosis Date   Asthma    CHF (congestive heart failure) (HCC)    CKD (chronic kidney disease), stage IV (HCC)    COVID-19 virus infection 04/2019   Diabetes mellitus without complication (HCC)    Fatigue 11/23/2020   Gout    Hypertension    Leg heaviness 11/23/2020   NSVT (nonsustained ventricular tachycardia) (HCC) 11/23/2020   Rectal cancer [facility])     Past Surgical History:  Procedure Laterality Date   BIOPSY  01/11/2020   Procedure: BIOPSY;  Surgeon: Hilarie Fredrickson, MD;  Location: [facility] ENDOSCOPY;  Service: Endoscopy;;   COLONOSCOPY WITH PROPOFOL N/A 01/11/2020   Procedure: COLONOSCOPY WITH PROPOFOL;  Surgeon: Hilarie Fredrickson, MD;  Location: [facility] ENDOSCOPY;  Service: Endoscopy;   Laterality: N/A;   NO PAST SURGERIES     POLYPECTOMY  01/11/2020   Procedure: POLYPECTOMY;  Surgeon: Hilarie Fredrickson, MD;  Location: [facility] ENDOSCOPY;  Service: Endoscopy;;   SUBMUCOSAL TATTOO INJECTION  01/11/2020   Procedure: SUBMUCOSAL TATTOO INJECTION;  Surgeon: Hilarie Fredrickson, MD;  Location: [facility] ENDOSCOPY;  Service: Endoscopy;;    Family History  Problem Relation Age of Onset   Heart failure Brother     Social History   Socioeconomic History   Marital status: Single    Spouse name: Not on file   Number of children: Not on file   Years of education: Not on file   Highest education level: Not on file  Occupational History   Not on file  Tobacco Use   Smoking status: Former    Packs/day: .5    Types: Cigarettes    Quit date: 10/27/2019    Years since quitting: 2.7   Smokeless tobacco: Never  Vaping Use   Vaping Use: Never used  Substance and Sexual Activity   Alcohol use: Yes    Alcohol/week: 0.0 standard drinks of alcohol    Comment: occ   Drug use: Yes    Types: Marijuana   Sexual activity: Not Currently  Other Topics Concern   Not on file  Social History Narrative   Not on file   Social Determinants of Health   Financial Resource Strain: High Risk (06/12/2021)   Overall Financial Resource Strain (CARDIA)    Difficulty of Paying Living Expenses: Hard  Food Insecurity:  No Food Insecurity (05/09/2022)   Hunger Vital Sign    Worried About Running Out of Food in the Last Year: Never true    Ran Out of Food in the Last Year: Never true  Transportation Needs: No Transportation Needs (05/09/2022)   PRAPARE - Administrator, Civil Service (Medical): No    Lack of Transportation (Non-Medical): No  Physical Activity: Inactive (06/12/2021)   Exercise Vital Sign    Days of Exercise per Week: 0 days    Minutes of Exercise per Session: 0 min  Stress: Stress Concern Present (06/12/2021)   [employer] of Occupational Health - Occupational Stress Questionnaire     Feeling of Stress : Rather much  Social Connections: Socially Isolated (06/12/2021)   Social Connection and Isolation Panel [NHANES]    Frequency of Communication with Friends and Family: Twice a week    Frequency of Social Gatherings with Friends and Family: Twice a week    Attends Religious Services: Never    Database administrator or Organizations: No    Attends Engineer, structural: Never    Marital Status: Divorced    No Known Allergies  Outpatient Medications Prior to Visit  Medication Sig Dispense Refill   albuterol (PROVENTIL) (2.5 MG/3ML) 0.083% nebulizer solution Take 3 mLs (2.5 mg total) by nebulization every 6 (six) hours as needed for wheezing or shortness of breath. 90 mL 12   albuterol (VENTOLIN HFA) 108 (90 Base) MCG/ACT inhaler Inhale 1-2 puffs into the lungs every 6 (six) hours as needed for wheezing or shortness of breath. 18 g 2   allopurinol (ZYLOPRIM) 100 MG tablet Take 1 tablet (100 mg total) by mouth daily. 30 tablet 6   amLODipine (NORVASC) 10 MG tablet Take 1 tablet (10 mg total) by mouth daily. 90 tablet 3   atorvastatin (LIPITOR) 80 MG tablet Take 1 tablet (80 mg total) by mouth daily. 90 tablet 3   Blood Glucose Monitoring Suppl (TRUE METRIX METER) w/Device KIT Check blood sugar fasting and ant bedtime and record 1 kit 0   carvedilol (COREG) 25 MG tablet Take 1 tablet (25 mg total) by mouth 2 (two) times daily with a meal. 180 tablet 3   colchicine 0.6 MG tablet Take 1 tablet by mouth as needed. Take 1 tablet (0.6 mg) by mouth at the onset of a gout attack with next dose no sooner than 3 days later 30 tablet 1   docusate sodium (COLACE) 100 MG capsule Take 1 capsule (100 mg total) by mouth 2 (two) times daily as needed for mild constipation. 10 capsule 0   furosemide (LASIX) 20 MG tablet Take 3 tablets (60 mg total) by mouth 2 (two) times daily. 180 tablet 2   glucose blood (TRUE METRIX BLOOD GLUCOSE TEST) test strip Use as instructed 200 each 12    hydrALAZINE (APRESOLINE) 100 MG tablet Take 1 tablet (100 mg total) by mouth every 8 (eight) hours. 90 tablet 1   isosorbide mononitrate (IMDUR) 60 MG 24 hr tablet Take 1 tablet (60 mg total) by mouth daily. 30 tablet 1   Misc. Devices (DIGITAL GLASS SCALE) MISC Use scale to weight yourself. Increasing weight may indicate fluid overload 1 each 0   pantoprazole (PROTONIX) 40 MG tablet Take 1 tablet (40 mg total) by mouth at bedtime. 30 tablet 1   TRUEplus Lancets 28G MISC Check blood sugar fasting and at bedtime 210 each 2   No facility-administered medications prior to visit.  ROS Review of Systems  Constitutional:  Negative for activity change and appetite change.  HENT:  Negative for sinus pressure and sore throat.   Respiratory:  Negative for chest tightness, shortness of breath and wheezing.   Cardiovascular:  Negative for chest pain and palpitations.  Gastrointestinal:  Negative for abdominal distention, abdominal pain and constipation.  Genitourinary: Negative.   Musculoskeletal: Negative.   Psychiatric/Behavioral:  Negative for behavioral problems and dysphoric mood.     Objective:  BP 123/74   Pulse 60   Ht 6\' 1"  (1.854 m)   Wt 214 lb 12.8 oz (97.4 kg)   SpO2 99%   BMI 28.34 kg/m      08/05/2022    1:43 PM 07/31/2022    9:41 AM 06/28/2022    3:07 PM  BP/Weight  Systolic BP 123 118 124  Diastolic BP 74 78 80  Wt. (Lbs) 214.8 212 219.2  BMI 28.34 kg/m2 27.97 kg/m2 28.92 kg/m2      Physical Exam Constitutional:      Appearance: He is well-developed.  Cardiovascular:     Rate and Rhythm: Normal rate.     Heart sounds: Normal heart sounds. No murmur heard. Pulmonary:     Effort: Pulmonary effort is normal.     Breath sounds: Normal breath sounds. No wheezing or rales.  Chest:     Chest wall: No tenderness.  Abdominal:     General: Bowel sounds are normal. There is no distension.     Palpations: Abdomen is soft. There is no mass.     Tenderness: There is no  abdominal tenderness.  Musculoskeletal:        General: Normal range of motion.     Right lower leg: No edema.     Left lower leg: No edema.  Neurological:     Mental Status: He is alert and oriented to person, place, and time.  Psychiatric:        Mood and Affect: Mood normal.        Latest Ref Rng & Units 07/24/2022   10:15 AM 05/12/2022    5:51 AM 05/09/2022    3:11 AM  CMP  Glucose 70 - 99 mg/dL 578  469  629   BUN 6 - 20 mg/dL 69  65  50   Creatinine 0.61 - 1.24 mg/dL 5.28  4.13  2.44   Sodium 135 - 145 mmol/L 139  138  135   Potassium 3.5 - 5.1 mmol/L 4.0  4.2  4.2   Chloride 98 - 111 mmol/L 102  102  103   CO2 22 - 32 mmol/L 25  22  23    Calcium 8.9 - 10.3 mg/dL 8.8  8.4  8.0   Total Protein 6.5 - 8.1 g/dL 7.8     Total Bilirubin 0.3 - 1.2 mg/dL 0.7     Alkaline Phos 38 - 126 U/L 110     AST 15 - 41 U/L 14     ALT 0 - 44 U/L 8       Lipid Panel     Component Value Date/Time   CHOL 95 05/05/2022 2244   CHOL 140 06/27/2021 0927   TRIG 131 05/05/2022 2244   HDL 35 (L) 05/05/2022 2244   HDL 29 (L) 06/27/2021 0927   CHOLHDL 2.7 05/05/2022 2244   VLDL 26 05/05/2022 2244   LDLCALC 34 05/05/2022 2244   LDLCALC 81 06/27/2021 0927    CBC    Component Value Date/Time   WBC  9.2 05/12/2022 0551   RBC 3.89 (L) 05/12/2022 0551   HGB 10.2 (L) 05/12/2022 0551   HGB 11.1 (L) 06/27/2021 0927   HCT 32.3 (L) 05/12/2022 0551   HCT 34.5 (L) 06/27/2021 0927   PLT 245 05/12/2022 0551   PLT 236 06/27/2021 0927   MCV 83.0 05/12/2022 0551   MCV 82 06/27/2021 0927   MCH 26.2 05/12/2022 0551   MCHC 31.6 05/12/2022 0551   RDW 17.5 (H) 05/12/2022 0551   RDW 16.6 (H) 06/27/2021 0927   LYMPHSABS 1.2 05/12/2022 0551   LYMPHSABS 1.0 06/27/2021 0927   MONOABS 1.3 (H) 05/12/2022 0551   EOSABS 0.4 05/12/2022 0551   EOSABS 0.3 06/27/2021 0927   BASOSABS 0.1 05/12/2022 0551   BASOSABS 0.1 06/27/2021 0927    Lab Results  Component Value Date   HGBA1C 5.7 (H) 05/05/2022     Assessment & Plan:  1. Hypertensive heart disease with chronic diastolic congestive heart failure (HCC) EF of 55 to 60% Euvolemic  2. Hypertension in chronic kidney disease due to type 2 diabetes mellitus (HCC) Controlled Continue antihypertensive Advised to schedule follow-up appointment with his nephrologist Avoid nephrotoxins Counseled on blood pressure goal of less than 130/80, low-sodium, DASH diet, medication compliance, 150 minutes of moderate intensity exercise per week. Discussed medication compliance, adverse effects.   3. Diabetic polyneuropathy associated with type 2 diabetes mellitus (HCC) Diabetes is diet controlled with A1c of 5.7 Uncontrolled neuropathy Will initiate gabapentin - gabapentin (NEURONTIN) 300 MG capsule; Take 1 capsule (300 mg total) by mouth at bedtime.  Dispense: 90 capsule; Refill: 1  4. Hemiparesis affecting left side as late effect of stroke (HCC) Secondary to uncontrolled hypertension and noncompliance which he has struggled with in the past. Secondary stroke prevention Continue statin He will benefit from PT Fall precautions Provided letter indicating his disability per request - Ambulatory referral to Physical Therapy   Meds ordered this encounter  Medications   gabapentin (NEURONTIN) 300 MG capsule    Sig: Take 1 capsule (300 mg total) by mouth at bedtime.    Dispense:  90 capsule    Refill:  1    Follow-up: Return in about 6 months (around 02/05/2023) for Chronic medical conditions.       Hoy Register, MD, FAAFP. [facility] [place], [place] [phone]   08/05/2022, 2:16 PM

## 2022-08-05 NOTE — Patient Instructions (Signed)
Diabetic Neuropathy Diabetic neuropathy refers to nerve damage that is caused by diabetes. Over time, people with diabetes can develop nerve damage throughout the body. There are several types of diabetic neuropathy: Peripheral neuropathy. This is the most common type of diabetic neuropathy. It damages the nerves that carry signals between the spinal cord and other parts of the body (peripheral nerves). This usually affects nerves in the feet, legs, hands, and arms. Autonomic neuropathy. This type causes damage to nerves that control involuntary functions (autonomic nerves). Involuntary functions are functions of the body that you do not control. They include heartbeat, body temperature, blood pressure, urination, digestion, sweating, sexual function, or response to changes in blood glucose. Focal neuropathy. This type of nerve damage affects one area of the body, such as an arm, a leg, or the face. The injury may involve one nerve or a small group of nerves. Focal neuropathy can be painful and unpredictable. It occurs most often in older adults with diabetes. This often develops suddenly, but usually improves over time and does not cause long-term problems. Proximal neuropathy. This type of nerve damage affects the nerves of the thighs, hips, buttocks, or legs. It causes severe pain, weakness, and muscle death (atrophy), usually in the thigh muscles. It is more common among older men and people who have type 2 diabetes. The length of recovery time may vary. What are the causes? Peripheral, autonomic, and focal neuropathies are caused by diabetes that is not well controlled with treatment. The cause of proximal neuropathy is not known, but it may be caused by inflammation related to uncontrolled blood glucose levels. What are the signs or symptoms? Peripheral neuropathy Peripheral neuropathy develops slowly over time. When the nerves of the feet and legs no longer work, you may experience: Burning,  stabbing, or aching pain in the legs or feet. Pain or cramping in the legs or feet. Loss of feeling (numbness) and inability to feel pressure or pain in the feet. This can lead to: Thick calluses or sores on areas of constant pressure. Ulcers. Reduced ability to feel temperature changes. Foot deformities. Muscle weakness. Loss of balance or coordination. Autonomic neuropathy The symptoms of autonomic neuropathy vary depending on which nerves are affected. Symptoms may include: Problems with digestion, such as: Nausea or vomiting. Poor appetite. Bloating. Diarrhea or constipation. Trouble swallowing. Losing weight without trying to. Problems with the heart, blood, and lungs, such as: Dizziness, especially when standing up. Fainting. Shortness of breath. Irregular heartbeat. Bladder problems, such as: Trouble starting or stopping urination. Leaking urine. Trouble emptying the bladder. Urinary tract infections (UTIs). Problems with other body functions, such as: Sweat. You may sweat too much or too little. Temperature. You might get hot easily. Or, you might feel cold more than usual. Sexual function. Men may not be able to get or maintain an erection. Women may have vaginal dryness and difficulty with arousal. Focal neuropathy Symptoms affect only one area of the body. Common symptoms include: Numbness. Tingling. Burning pain. Prickling feeling. Very sensitive skin. Weakness. Inability to move (paralysis). Muscle twitching. Muscles getting smaller (wasting). Poor coordination. Double or blurred vision. Proximal neuropathy Sudden, severe pain in the hip, thigh, or buttocks. Pain may spread from the back into the legs (sciatica). Pain and numbness in the arms and legs. Tingling. Loss of bladder control or bowel control. Weakness and wasting of thigh muscles. Difficulty getting up from a seated position. Abdominal swelling. Unexplained weight loss. How is this  diagnosed? Diagnosis varies depending on the type   of neuropathy your health care provider suspects. Peripheral neuropathy Your health care provider will do a neurologic exam. This exam checks your reflexes, how you move, and what you can feel. You may have other tests, such as: Blood tests. Tests of the fluid that surrounds the spinal cord (lumbar puncture). CT scan. MRI. Checking the nerves that control muscles (electromyogram, or EMG). Checking how quickly signals pass through your nerves (nerve conduction study). Checking a small piece of a nerve using a microscope (biopsy). Autonomic neuropathy You may have tests, such as: Tests to measure your blood pressure and heart rate. You may be secured to an exam table that moves you from a lying position to an upright position (table tilt test). Breathing tests to check your lungs. Tests to check how food moves through the digestive system (gastric emptying tests). Blood, sweat, or urine tests. Ultrasound of your bladder. Spinal fluid tests. Focal neuropathy This condition may be diagnosed with: A neurologic exam. CT scan. MRI. EMG. Nerve conduction study. Proximal neuropathy There is no test to diagnose this type of neuropathy. You may have tests to rule out other possible causes of this type of neuropathy. Tests may include: X-rays of your spine and lumbar region. Lumbar puncture. MRI. How is this treated? The goal of treatment is to keep nerve damage from getting worse. Treatment may include: Following your diabetes management plan. This will help keep your blood glucose level and your A1C level within your target range. This is the most important treatment. Using prescription pain medicine. Follow these instructions at home: Diabetes management Follow your diabetes management plan as told by your health care provider. Check your blood glucose levels. Keep your blood glucose in your target range. Have your A1C level checked at  least two times a year, or as often as told. Take over the counter and prescription medicines only as told by your health care provider. This includes insulin and diabetes medicine.  Lifestyle  Do not use any products that contain nicotine or tobacco, such as cigarettes, e-cigarettes, and chewing tobacco. If you need help quitting, ask your health care provider. Be physically active every day. Include strength training and balance exercises. Follow a healthy meal plan. Work with your health care provider to manage your blood pressure. General instructions Ask your health care provider if the medicine prescribed to you requires you to avoid driving or using machinery. Check your skin and feet every day for cuts, bruises, redness, blisters, or sores. Keep all follow-up visits. This is important. Contact a health care provider if: You have burning, stabbing, or aching pain in your legs or feet. You are unable to feel pressure or pain in your feet. You develop problems with digestion, such as: Nausea. Vomiting. Bloating. Constipation. Diarrhea. Abdominal pain. You have difficulty with urination, such as: Inability to control when you urinate (incontinence). Inability to completely empty the bladder (retention). You feel as if your heart is racing (palpitations). You feel dizzy, weak, or faint when you stand up. Get help right away if: You cannot urinate. You have sudden weakness or loss of coordination. You have trouble speaking. You have pain or pressure in your chest. You have an irregular heartbeat. You have sudden inability to move a part of your body. These symptoms may represent a serious problem that is an emergency. Do not wait to see if the symptoms will go away. Get medical help right away. Call your local emergency services (911 in the [place] Do not drive yourself to   the hospital. Summary Diabetic neuropathy is nerve damage that is caused by diabetes. It can cause numbness  and pain in the arms, legs, digestive tract, heart, and other body systems. This condition is treated by keeping your blood glucose level and your A1C level within your target range. This can help prevent neuropathy from getting worse. Check your skin and feet every day for cuts, bruises, redness, blisters, or sores. Do not use any products that contain nicotine or tobacco, such as cigarettes, e-cigarettes, and chewing tobacco. This information is not intended to replace advice given to you by your health care provider. Make sure you discuss any questions you have with your health care provider. Document Revised: 07/29/2019 Document Reviewed: 07/29/2019 Elsevier Patient Education  2023 [employer]vier Inc.  

## 2022-08-08 ENCOUNTER — Other Ambulatory Visit: Payer: Self-pay

## 2022-08-08 ENCOUNTER — Telehealth: Payer: Self-pay | Admitting: Family Medicine

## 2022-08-08 NOTE — Telephone Encounter (Signed)
The patient called in stating he recently had a stroke and was hospitalized., He also lost all of his meds and states he cannot afford to get any refills although he has refills left. He does not know what to do but knows he needs his meds as soon as possible please assist further. He uses  [facility] Phone: [phone]  Fax: [phone]

## 2022-08-08 NOTE — Telephone Encounter (Signed)
FYI

## 2022-08-08 NOTE — Telephone Encounter (Signed)
Hey is there anything we can do for this patient and his medications.

## 2022-08-14 ENCOUNTER — Telehealth: Payer: Self-pay | Admitting: Family Medicine

## 2022-08-14 NOTE — Telephone Encounter (Signed)
Craig West with [facility] called to follow up on fax that was submitted 07/31/2022 regarding incontinence supplies and diabetic testing supplies. Please advise, he says they have yet to receive correspondence  Best contact: [phone]  He is refaxing it now he says

## 2022-08-15 DIAGNOSIS — I10 Essential (primary) hypertension: Secondary | ICD-10-CM | POA: Diagnosis not present

## 2022-08-16 NOTE — Telephone Encounter (Signed)
Orders has been faxed.

## 2022-08-20 ENCOUNTER — Telehealth: Payer: Self-pay | Admitting: Family Medicine

## 2022-08-20 ENCOUNTER — Ambulatory Visit: Payer: Medicaid Other | Attending: Family Medicine | Admitting: Physical Therapy

## 2022-08-20 ENCOUNTER — Encounter: Payer: Self-pay | Admitting: Physical Therapy

## 2022-08-20 VITALS — BP 122/75 | HR 64

## 2022-08-20 DIAGNOSIS — R269 Unspecified abnormalities of gait and mobility: Secondary | ICD-10-CM | POA: Diagnosis present

## 2022-08-20 DIAGNOSIS — I69354 Hemiplegia and hemiparesis following cerebral infarction affecting left non-dominant side: Secondary | ICD-10-CM | POA: Diagnosis not present

## 2022-08-20 DIAGNOSIS — R2681 Unsteadiness on feet: Secondary | ICD-10-CM | POA: Insufficient documentation

## 2022-08-20 DIAGNOSIS — M6281 Muscle weakness (generalized): Secondary | ICD-10-CM | POA: Insufficient documentation

## 2022-08-20 DIAGNOSIS — R2689 Other abnormalities of gait and mobility: Secondary | ICD-10-CM | POA: Diagnosis present

## 2022-08-20 NOTE — Therapy (Signed)
OUTPATIENT PHYSICAL THERAPY NEURO EVALUATION   Patient Name: Craig West MRN: [id number] DOB:[birth year], 57 y.o., male Today's Date: 08/20/2022   PCP: Hoy Register, MD REFERRING PROVIDER: Hoy Register, MD  END OF SESSION:  PT End of Session - 08/20/22 0758     Visit Number 1    Number of Visits 17   including eval   Date for PT Re-Evaluation 10/29/22    Authorization Type [place] Medicaid    PT Start Time 0758    PT Stop Time 0845    PT Time Calculation (min) 47 min    Equipment Utilized During Treatment Gait belt    Activity Tolerance Patient tolerated treatment well    Behavior During Therapy WFL for tasks assessed/performed;Agitated   agitated with suggestion of use of AD, otherwise [facility]            Past Medical History:  Diagnosis Date   Asthma    CHF (congestive heart failure) (HCC)    CKD (chronic kidney disease), stage IV (HCC)    COVID-19 virus infection 04/2019   Diabetes mellitus without complication (HCC)    Fatigue 11/23/2020   Gout    Hypertension    Leg heaviness 11/23/2020   NSVT (nonsustained ventricular tachycardia) (HCC) 11/23/2020   Rectal cancer [facility])    Past Surgical History:  Procedure Laterality Date   BIOPSY  01/11/2020   Procedure: BIOPSY;  Surgeon: Hilarie Fredrickson, MD;  Location[facility] ENDOSCOPY;  Service: Endoscopy;;   COLONOSCOPY WITH PROPOFOL N/A 01/11/2020   Procedure: COLONOSCOPY WITH PROPOFOL;  Surgeon: Hilarie Fredrickson, MD;  Location[facility] ENDOSCOPY;  Service: Endoscopy;  Laterality: N/A;   NO PAST SURGERIES     POLYPECTOMY  01/11/2020   Procedure: POLYPECTOMY;  Surgeon: Hilarie Fredrickson, MD;  Location[facility] ENDOSCOPY;  Service: Endoscopy;;   SUBMUCOSAL TATTOO INJECTION  01/11/2020   Procedure: SUBMUCOSAL TATTOO INJECTION;  Surgeon: Hilarie Fredrickson, MD;  Location: [facility] ENDOSCOPY;  Service: Endoscopy;;   Patient Active Problem List   Diagnosis Date Noted   Hemiparesis affecting left side as late effect of stroke (HCC) 08/05/2022   Stroke (HCC)  05/05/2022   Hemorrhagic stroke (HCC) 05/05/2022   Hypertensive emergency 05/05/2022   Nontraumatic intracerebral hemorrhage (HCC) 05/05/2022   BPH (benign prostatic hyperplasia) 03/27/2021   Fatigue 11/23/2020   Leg heaviness 11/23/2020   NSVT (nonsustained ventricular tachycardia) (HCC) 11/23/2020   Diabetes mellitus without complication (HCC)    CKD (chronic kidney disease), stage IV (HCC)    PICC (peripherally inserted central catheter) in place 03/27/2020   Rectal cancer (HCC) 01/27/2020   Benign neoplasm of ascending colon    Benign neoplasm of descending colon    Benign neoplasm of sigmoid colon    Rectal mass    Rectal bleeding    Iron deficiency anemia    Chronic diastolic heart failure (HCC)    Hyperkalemia    Volume overload 01/06/2020   Demand ischemia    COVID-19 virus infection 04/2019   Obesity (BMI 30.0-34.9) 12/09/2017   Non compliance w medication regimen 04/18/2017   Gout 08/02/2016   Diabetic neuropathy (HCC) 08/02/2016   Hyperlipidemia 10/24/2015   Asthma 10/16/2015   Type 2 diabetes mellitus without complication (HCC) 06/13/2014   Essential hypertension 06/13/2014   Smoker 06/13/2014    ONSET DATE: 08/05/2022 (referral)  REFERRING DIAG: W09.811 (ICD-10-CM) - Hemiparesis affecting left side as late effect of stroke (HCC)  THERAPY DIAG:  Abnormality of gait and mobility - Plan: PT plan of care cert/re-cert  Unsteadiness on feet - Plan: PT plan of care cert/re-cert  Muscle weakness (generalized) - Plan: PT plan of care cert/re-cert  Rationale for Evaluation and Treatment: Rehabilitation  SUBJECTIVE:                                                                                                                                                                                             SUBJECTIVE STATEMENT: Patient arrives to session walking without AD. Patient reports that he was unable to walk when he left the hospital. Patient was then treated at  [facility] for what he think was about 2 months. Patient is now living back at home and he states he manages. Patient does not want to use AD and SPC. Patient reports that he is very frustrated with former therapy as he was told that he would never walk again. Patient was told he had to leave with walker when leaving SNF but he has never used since. Patient reports that he walks in spurts over short distances.   Pt accompanied by: self  PERTINENT HISTORY: left-sided weakness and CT scan revealed hemorrhagic stroke thought to be ischemic with conversion 05/05/2022, hypertension, type 2 diabetes mellitus, asthma, gout, stage IV chronic kidney disease, Hypertensive Heart Disease, Rectal carcinoma diagnosed in 12/2018 (completed chemotherapy and radiation), s/p Low anterior resection with diverting Colostomy at [facility] on 12/06/20 with subsequent reversal of colostomy PAIN:  Are you having pain? No  PRECAUTIONS: Fall  WEIGHT BEARING RESTRICTIONS: No  FALLS: Has patient fallen in last 6 months? No  LIVING ENVIRONMENT: Lives with:  lives with friends Lives in: House/apartment Stairs: No Has following equipment at home: Single point cane, Environmental consultant - 2 wheeled, shower chair, and Grab bars  PLOF: Independent - On Disability prior to stroke; Patient states he could run/move freely prior to the stroke  PATIENT GOALS: "I want to run."  OBJECTIVE:   DIAGNOSTIC FINDINGS:   05/07/2022 MR Brain wo Contrasts: IMPRESSION: 1. Right thalamic hematoma is stable since 05/05/2022, estimated blood volume of 3 mL. Regional edema but no significant intracranial mass effect. And no IVH, ventriculomegaly or other complicating features. 2. Negative motion degraded intracranial MRA. And evidence of chronic small vessel disease, with other chronic microhemorrhages in the left thalamus, left pons, and also the deep left cerebellum. Suspect some encephalomalacia in the cerebellum, and also  some chronic Wallerian degeneration in the right brainstem. 3. No other acute intracranial abnormality.  05/06/2024 CT Head w/o Contrast: IMPRESSION: 1. No significant interval change in size and morphology of intraparenchymal hematoma centered at the right thalamus. Mild localized edema without significant regional mass effect.  2. No other new acute intracranial abnormality.  05/05/2022 CT Head wo Contrast: IMPRESSION: Acute hypertensive type hemorrhage in the right thalamus measuring 2.6 mL.   COGNITION: Overall cognitive status: Within functional limits for tasks assessed   SENSATION: Numbness on L hemiparetic side  COORDINATION: Mild dysmetria noted on hemiparetic side on L  EDEMA:  WNL  MUSCLE TONE: LLE: Modifed Ashworth Scale 1+ = Slight increase in muscle tone, manifested by a catch, followed by minimal resistance throughout the remainder (less than half) of the ROM  POSTURE: rounded shoulders and forward head  LOWER EXTREMITY ROM:       L hemiparesis results in decreased ROM on L side primarily   LOWER EXTREMITY MMT:    Held due to presence of tone on LLE but increased weakness noted on this side functionally with sit to stand and bias for RLE, grossly 4+/5 on RLE in hip flexor, knee extensors, knee flexors, dorsiflexors  BED MOBILITY:  Patient reports no difficultly in this area  TRANSFERS: Assistive device utilized: None  Sit to stand: SBA Stand to sit: SBA Chair to chair: SBA Slight hyperextension thrust on   GAIT: Gait pattern: decreased arm swing- Left, decreased step length- Left, decreased hip/knee flexion- Left, circumduction- Left, and wide BOS Distance walked: 2 x 80 feet, 1 x 460 feet, other short various clinic distances Assistive device utilized: None Level of assistance: SBA and CGA Comments: Patient unsteady particularly around turns, intermission extension thrust and increased knee valgus on RLE, very WBOS, would likely benefit from AD but  patient addiment against using one  FUNCTIONAL TESTS:    [facility] PT Assessment - 08/20/22 0001       6 minute walk test results    Aerobic Endurance Distance Walked 478   feet without AD (145.6 meters) with CGA (minA 1x on turn)   Endurance additional comments RPE: 6/10, only able to tolerate first due to fatigue      Standardized Balance Assessment   Standardized Balance Assessment Five Times Sit to Stand;Timed Up and Go Test;10 meter walk test;Balance Master Testing    Five times sit to stand comments  13.34   with bilateral use of UE (SBA - stabilizes legs on chair)   10 Meter Walk 0.67 m/s   without AD (SBA-CGA)     Timed Up and Go Test   Normal TUG (seconds) 9.31   without AD (SBA-CGA)             PATIENT SURVEYS:  FOTO 74  TODAY'S TREATMENT:                                                                                                                               Initial Eval only  PATIENT EDUCATION: Education details: POC, examination findings, goals, results, continued to recommend use of AD Person educated: Patient Education method: Explanation Education comprehension: verbalized understanding, needs further education, and politely refused AD  HOME EXERCISE PROGRAM:  To be provided  GOALS: Goals reviewed with patient? Yes  SHORT TERM GOALS: Target date: 09/17/2022  Patient will demonstrate 100% compliance with initial HEP to continue to progress between physical therapy sessions.   Baseline: To be provided Goal status: INITIAL  2. Patient will complete 1x sit to stand without UE use to demonstrate improved LE strength and decreased falls risk. Baseline: Requires bilateral use of UE Goal status: INITIAL  3.  Patient will improve gait speed to 0.77 m/s with LRAD to indicate improvement towards the level of community ambulator in order to participate more easily in activities outside of the home.   Baseline: 0.67 m/s without AD Goal status:  INITIAL  4.  Patient will tolerate entire duration of with LRAD to indicated improved cardiorespiratory endurance for shopping outside of home.  Baseline: tolerated first 3 minutes (478 feet) Goal status: INITIAL  5.  Berg to be assessed/goal written as indicated Baseline: To be assessed Goal status: INITIAL  LONG TERM GOALS: Target date: 10/29/2022  Patient will report demonstrate independence with final HEP in order to maintain current gains and continue to progress after physical therapy discharge.   Baseline: To be provided Goal status: INITIAL  2.  Patient will complete 5xSTS without UE to indicate improved LE strength.  Baseline: 13.34" with bilateral UE support (SBA) Goal status: INITIAL  3.  Patient will improve gait speed to 0.87 m/s with LRAD to indicate improvement to the level of community ambulator in order to participate more easily in activities outside of the home.   Baseline: 0.67 m/s without AD  Goal status: INITIAL  4.  Patient will improve with LRAD by 50 meters to achieve age reported norms for aerobic capacity and endurance in order to participate more easily in daily walking tasks.   Baseline: 145.6 meters without AD (CGA with minA x 1 due to LOB around curve) Goal status: INITIAL  5.  Patient will improve FOTO score to 74 to achieve predicted improvements in functional mobility due to skilled physical therapy interventions to increase safety with and participation in daily activities.  Baseline: 60 Goal status: INITIAL  6.  Berg to be assessed/goal written as indicated Baseline: To be assessed Goal status: INITIAL  ASSESSMENT:  CLINICAL IMPRESSION: Patient is a 57 y.o. male who was seen today for physical therapy evaluation and treatment for impairments secondary to hemorraghic stroke with L hemiparesis with PMH of stage IV chronic kidney disease, hypertension, rectal cancer, and DMII. Patient presents with deficits including impaired gait,  decreased safety awareness with refusal to use AD, decreased cardiorespiratory endurance, and increased risk for falls. Patient is at an increased risk for falls as indicated by 5xSTS and . Furthermore, patient demonstrates significantly decreased cardiorespiratory endurance as compared to age matched norms as indicated by and is unable to tolerate entire duration of this test due to fatigue at 3 minutes. Patient will benefit form skilled physical therapy services to improve safety, decrease falls risk, and maximize function outside of therapy.   OBJECTIVE IMPAIRMENTS: Abnormal gait, decreased activity tolerance, decreased balance, decreased endurance, difficulty walking, decreased ROM, decreased strength, decreased safety awareness, impaired sensation, and impaired tone.   ACTIVITY LIMITATIONS: carrying, standing, squatting, and locomotion level  PARTICIPATION LIMITATIONS: cleaning, shopping, and community activity  PERSONAL FACTORS: Behavior pattern, Past/current experiences, and 3+ comorbidities: see above  are also affecting patient's functional outcome.   REHAB POTENTIAL: Fair Patient resistant to trial of AD during today's session, otherwise patient strongly motivated  to defy limitations  CLINICAL DECISION MAKING: Evolving/moderate complexity  EVALUATION COMPLEXITY: Moderate  PLAN:  PT FREQUENCY: 2x/week  PT DURATION: 8 weeks  PLANNED INTERVENTIONS: Therapeutic exercises, Therapeutic activity, Neuromuscular re-education, Balance training, Gait training, Patient/Family education, Self Care, Joint mobilization, DME instructions, Manual therapy, and Re-evaluation  PLAN FOR NEXT SESSION: assess Berg/write goal as indicated, patient will likely not want to try AD but would benefit from possible AFO trial or AD (introduce as tolerate), cardiorespiratory endurance as able, initial HEP with NMR for L hemiparesis   Carmelia Bake, PT, DPT 08/20/2022, 9:28 AM  Check all possible  CPT codes: 16109 - PT Re-evaluation, 97110- Therapeutic Exercise, [phone]- Neuro Re-education, [phone] - Gait Training, [phone] - Manual Therapy, 97530 - Therapeutic Activities, and [phone] - Self Care    Check all conditions that are expected to impact treatment: {Conditions expected to impact treatment:Respiratory disorders, Diabetes mellitus, and Neurological condition and/or seizures   If treatment provided at initial evaluation, no treatment charged due to lack of authorization.

## 2022-08-20 NOTE — Telephone Encounter (Signed)
Height and weight has been added to the form and faxed over.

## 2022-08-20 NOTE — Telephone Encounter (Signed)
Copied from CRM [id number]. Topic: General - Other >> Aug 20, 2022  2:18 PM Everette C wrote: Reason for CRM: The patient has been told that they're unable to receive their incontinence supplies until their height and weight is included in the required documents that have been submitted by the practice   The patient would like their documents updated so they're able to receive their supplies  Please contact the patient further when available

## 2022-08-23 ENCOUNTER — Ambulatory Visit (HOSPITAL_BASED_OUTPATIENT_CLINIC_OR_DEPARTMENT_OTHER): Payer: Medicaid Other | Admitting: Family

## 2022-08-23 DIAGNOSIS — E119 Type 2 diabetes mellitus without complications: Secondary | ICD-10-CM | POA: Diagnosis not present

## 2022-08-27 ENCOUNTER — Encounter: Payer: Self-pay | Admitting: Nurse Practitioner

## 2022-08-28 ENCOUNTER — Ambulatory Visit: Payer: Medicaid Other | Admitting: Physical Therapy

## 2022-08-28 DIAGNOSIS — R2681 Unsteadiness on feet: Secondary | ICD-10-CM

## 2022-08-28 DIAGNOSIS — R159 Full incontinence of feces: Secondary | ICD-10-CM | POA: Diagnosis not present

## 2022-08-28 DIAGNOSIS — I10 Essential (primary) hypertension: Secondary | ICD-10-CM | POA: Diagnosis not present

## 2022-08-28 DIAGNOSIS — M6281 Muscle weakness (generalized): Secondary | ICD-10-CM

## 2022-08-28 DIAGNOSIS — R269 Unspecified abnormalities of gait and mobility: Secondary | ICD-10-CM | POA: Diagnosis not present

## 2022-08-28 DIAGNOSIS — G8220 Paraplegia, unspecified: Secondary | ICD-10-CM | POA: Diagnosis not present

## 2022-08-28 DIAGNOSIS — R2689 Other abnormalities of gait and mobility: Secondary | ICD-10-CM

## 2022-08-28 NOTE — Therapy (Signed)
OUTPATIENT PHYSICAL THERAPY NEURO TREATMENT   Patient Name: Craig West MRN: [id number] DOB:09-10-1965, 57 y.o., male Today's Date: 08/28/2022   PCP: Hoy Register, MD REFERRING PROVIDER: Hoy Register, MD  END OF SESSION:  PT End of Session - 08/28/22 8657     Visit Number 2    Number of Visits 17   including eval   Date for PT Re-Evaluation 10/29/22    Authorization Type [place] Medicaid    Progress Note Due on Visit 10    PT Start Time 0802    PT Stop Time 0845    PT Time Calculation (min) 43 min    Equipment Utilized During Treatment --    Activity Tolerance Patient tolerated treatment well    Behavior During Therapy [facility] for tasks assessed/performed              Past Medical History:  Diagnosis Date   Asthma    CHF (congestive heart failure) (HCC)    CKD (chronic kidney disease), stage IV (HCC)    COVID-19 virus infection 04/2019   Diabetes mellitus without complication (HCC)    Fatigue 11/23/2020   Gout    Hypertension    Leg heaviness 11/23/2020   NSVT (nonsustained ventricular tachycardia) (HCC) 11/23/2020   Rectal cancer [facility])    Past Surgical History:  Procedure Laterality Date   BIOPSY  01/11/2020   Procedure: BIOPSY;  Surgeon: Hilarie Fredrickson, MD;  Location: [facility] ENDOSCOPY;  Service: Endoscopy;;   COLONOSCOPY WITH PROPOFOL N/A 01/11/2020   Procedure: COLONOSCOPY WITH PROPOFOL;  Surgeon: Hilarie Fredrickson, MD;  Location: [facility] ENDOSCOPY;  Service: Endoscopy;  Laterality: N/A;   NO PAST SURGERIES     POLYPECTOMY  01/11/2020   Procedure: POLYPECTOMY;  Surgeon: Hilarie Fredrickson, MD;  Location: [facility] ENDOSCOPY;  Service: Endoscopy;;   SUBMUCOSAL TATTOO INJECTION  01/11/2020   Procedure: SUBMUCOSAL TATTOO INJECTION;  Surgeon: Hilarie Fredrickson, MD;  Location: [facility] ENDOSCOPY;  Service: Endoscopy;;   Patient Active Problem List   Diagnosis Date Noted   Hemiparesis affecting left side as late effect of stroke (HCC) 08/05/2022   Stroke (HCC) 05/05/2022   Hemorrhagic stroke (HCC)  05/05/2022   Hypertensive emergency 05/05/2022   Nontraumatic intracerebral hemorrhage (HCC) 05/05/2022   BPH (benign prostatic hyperplasia) 03/27/2021   Fatigue 11/23/2020   Leg heaviness 11/23/2020   NSVT (nonsustained ventricular tachycardia) (HCC) 11/23/2020   Diabetes mellitus without complication (HCC)    CKD (chronic kidney disease), stage IV (HCC)    PICC (peripherally inserted central catheter) in place 03/27/2020   Rectal cancer (HCC) 01/27/2020   Benign neoplasm of ascending colon    Benign neoplasm of descending colon    Benign neoplasm of sigmoid colon    Rectal mass    Rectal bleeding    Iron deficiency anemia    Chronic diastolic heart failure (HCC)    Hyperkalemia    Volume overload 01/06/2020   Demand ischemia    COVID-19 virus infection 04/2019   Obesity (BMI 30.0-34.9) 12/09/2017   Non compliance w medication regimen 04/18/2017   Gout 08/02/2016   Diabetic neuropathy (HCC) 08/02/2016   Hyperlipidemia 10/24/2015   Asthma 10/16/2015   Type 2 diabetes mellitus without complication (HCC) 06/13/2014   Essential hypertension 06/13/2014   Smoker 06/13/2014    ONSET DATE: 08/05/2022 (referral)  REFERRING DIAG: Q46.962 (ICD-10-CM) - Hemiparesis affecting left side as late effect of stroke (HCC)  THERAPY DIAG:  Unsteadiness on feet  Muscle weakness (generalized)  Other abnormalities of gait and  mobility  Rationale for Evaluation and Treatment: Rehabilitation  SUBJECTIVE:                                                                                                                                                                                             SUBJECTIVE STATEMENT: Patient arrives to session walking without AD. Pt reports his leg was sore following last session, lasted for a few days. No falls   Pt accompanied by: self  PERTINENT HISTORY: left-sided weakness and CT scan revealed hemorrhagic stroke thought to be ischemic with conversion  05/05/2022, hypertension, type 2 diabetes mellitus, asthma, gout, stage IV chronic kidney disease, Hypertensive Heart Disease, Rectal carcinoma diagnosed in 12/2018 (completed chemotherapy and radiation), s/p Low anterior resection with diverting Colostomy at [facility] on 12/06/20 with subsequent reversal of colostomy PAIN:  Are you having pain? No  PRECAUTIONS: Fall  WEIGHT BEARING RESTRICTIONS: No  FALLS: Has patient fallen in last 6 months? No  LIVING ENVIRONMENT: Lives with:  lives with friends Lives in: House/apartment Stairs: No Has following equipment at home: Single point cane, Environmental consultant - 2 wheeled, shower chair, and Grab bars  PLOF: Independent - On Disability prior to stroke; Patient states he could run/move freely prior to the stroke  PATIENT GOALS: "I want to run."   OBJECTIVE:   DIAGNOSTIC FINDINGS:   05/07/2022 MR Brain wo Contrasts: IMPRESSION: 1. Right thalamic hematoma is stable since 05/05/2022, estimated blood volume of 3 mL. Regional edema but no significant intracranial mass effect. And no IVH, ventriculomegaly or other complicating features. 2. Negative motion degraded intracranial MRA. And evidence of chronic small vessel disease, with other chronic microhemorrhages in the left thalamus, left pons, and also the deep left cerebellum. Suspect some encephalomalacia in the cerebellum, and also some chronic Wallerian degeneration in the right brainstem. 3. No other acute intracranial abnormality.  05/06/2024 CT Head w/o Contrast: IMPRESSION: 1. No significant interval change in size and morphology of intraparenchymal hematoma centered at the right thalamus. Mild localized edema without significant regional mass effect. 2. No other new acute intracranial abnormality.  05/05/2022 CT Head wo Contrast: IMPRESSION: Acute hypertensive type hemorrhage in the right thalamus measuring 2.6 mL.   COGNITION: Overall cognitive status: Within functional limits for tasks  assessed   SENSATION: Numbness on L hemiparetic side  COORDINATION: Mild dysmetria noted on hemiparetic side on L  EDEMA:  WNL  MUSCLE TONE: LLE: Modifed Ashworth Scale 1+ = Slight increase in muscle tone, manifested by a catch, followed by minimal resistance throughout the remainder (less than half) of the ROM  POSTURE: rounded shoulders and forward head  LOWER EXTREMITY ROM:  L hemiparesis results in decreased ROM on L side primarily   LOWER EXTREMITY MMT:    Held due to presence of tone on LLE but increased weakness noted on this side functionally with sit to stand and bias for RLE, grossly 4+/5 on RLE in hip flexor, knee extensors, knee flexors, dorsiflexors  TODAY'S TREATMENT:   Ther Act Educated pt on AFO types and provided demos for pt. Pt not agreeable to try this date but stated he would try next session w/this therapist.  Lengthy discussion regarding importance of building aerobic base for improved endurance and independence. Pt reports he does not want to use AD as he feels it takes away from him using his LLE. Informed pt that use of a cane will not take away from using his LLE and will assist in improving his cardiovascular endurance and aerobic capacity so he can walk for longer. Pt acknowledges this and states he needs to start doing more for himself, such as pumping gas or walking into the convenience store, as he is relying on others to do this for him even though he can do for himself. Pt reports he is interested in aquatic therapy, as he was informed in rehab that walking in the pool will improve his endurance. Scheduled pt in aquatics next week and provided pt w/aquatic therapy handout. All questions addressed.    [facility] PT Assessment - 08/28/22 0835       Balance   Balance Assessed Yes      Standardized Balance Assessment   Standardized Balance Assessment Berg Balance Test      Berg Balance Test   Sit to Stand Able to stand  independently using hands     Standing Unsupported Able to stand safely 2 minutes   wide BOS   Sitting with Back Unsupported but Feet Supported on Floor or Stool Able to sit safely and securely 2 minutes    Stand to Sit Controls descent by using hands              Ther Ex SciFit multi-peaks level 2.5 for 8 minutes using BUE/BLEs for neural priming for reciprocal movement, dynamic cardiovascular conditioning and increased amplitude of stepping. RPE of 6-7/10 following activity.     GAIT: Gait pattern: decreased arm swing- Left, decreased step length- Left, decreased hip/knee flexion- Left, circumduction- Left, and wide BOS Distance walked: various clinic distances Assistive device utilized: None Level of assistance: SBA and CGA Comments: Patient unsteady particularly around turns, intermission extension thrust and increased knee valgus on RLE, very WBOS. Pt agreeable to try AFO w/this therapist only despite max encouragement to try one next session. Noted L knee buckle following SciFit, pt reports this happens frequently    PATIENT EDUCATION: Education details: Plan for aquatic therapy, trialing AFO next session  Person educated: Patient Education method: Explanation and Handouts Education comprehension: verbalized understanding and needs further education  HOME EXERCISE PROGRAM: To be provided  GOALS: Goals reviewed with patient? Yes  SHORT TERM GOALS: Target date: 09/17/2022  Patient will demonstrate 100% compliance with initial HEP to continue to progress between physical therapy sessions.   Baseline: To be provided Goal status: INITIAL  2. Patient will complete 1x sit to stand without UE use to demonstrate improved LE strength and decreased falls risk. Baseline: Requires bilateral use of UE Goal status: INITIAL  3.  Patient will improve gait speed to 0.77 m/s with LRAD to indicate improvement towards the level of community ambulator in order to participate more easily  in activities outside of the  home.   Baseline: 0.67 m/s without AD Goal status: INITIAL  4.  Patient will tolerate entire duration of with LRAD to indicated improved cardiorespiratory endurance for shopping outside of home.  Baseline: tolerated first 3 minutes (478 feet) Goal status: INITIAL  5.  Berg to be assessed/goal written as indicated Baseline: To be assessed Goal status: INITIAL  LONG TERM GOALS: Target date: 10/29/2022  Patient will report demonstrate independence with final HEP in order to maintain current gains and continue to progress after physical therapy discharge.   Baseline: To be provided Goal status: INITIAL  2.  Patient will complete 5xSTS without UE to indicate improved LE strength.  Baseline: 13.34" with bilateral UE support (SBA) Goal status: INITIAL  3.  Patient will improve gait speed to 0.87 m/s with LRAD to indicate improvement to the level of community ambulator in order to participate more easily in activities outside of the home.   Baseline: 0.67 m/s without AD  Goal status: INITIAL  4.  Patient will improve with LRAD by 50 meters to achieve age reported norms for aerobic capacity and endurance in order to participate more easily in daily walking tasks.   Baseline: 145.6 meters without AD (CGA with minA x 1 due to LOB around curve) Goal status: INITIAL  5.  Patient will improve FOTO score to 74 to achieve predicted improvements in functional mobility due to skilled physical therapy interventions to increase safety with and participation in daily activities.  Baseline: 60 Goal status: INITIAL  6.  Berg to be assessed/goal written as indicated Baseline: To be assessed Goal status: INITIAL  ASSESSMENT:  CLINICAL IMPRESSION: Emphasis of skilled PT session on pt education regarding AFOs, improving aerobic capacity and aquatic therapy. Pt resistant to trying an AFO, but following extensive education regarding benefits, pt agreeable to try next session. Pt continues to be  limited by poor cardiovascular endurance and LLE pain and expressed interest in aquatic therapy, so is scheduled to start next week. Pt continues to demonstrate knee extension thrust on LLE w/knee buckling noted w/exertion. Started Berg balance Test to assess static balance but will need to finish next session. Continue POC.   OBJECTIVE IMPAIRMENTS: Abnormal gait, decreased activity tolerance, decreased balance, decreased endurance, difficulty walking, decreased ROM, decreased strength, decreased safety awareness, impaired sensation, and impaired tone.   ACTIVITY LIMITATIONS: carrying, standing, squatting, and locomotion level  PARTICIPATION LIMITATIONS: cleaning, shopping, and community activity  PERSONAL FACTORS: Behavior pattern, Past/current experiences, and 3+ comorbidities: see above  are also affecting patient's functional outcome.   REHAB POTENTIAL: Fair Patient resistant to trial of AD during today's session, otherwise patient strongly motivated to defy limitations  CLINICAL DECISION MAKING: Evolving/moderate complexity  EVALUATION COMPLEXITY: Moderate  PLAN:  PT FREQUENCY: 2x/week  PT DURATION: 8 weeks  PLANNED INTERVENTIONS: Therapeutic exercises, Therapeutic activity, Neuromuscular re-education, Balance training, Gait training, Patient/Family education, Self Care, Joint mobilization, DME instructions, Manual therapy, and Re-evaluation  PLAN FOR NEXT SESSION:  Irving Burton: Pt wants to work on endurance and walking in the pool. He has a tough work schedule, so only scheduled him once. He wants to see how this session goes before scheduling more. He could benefit from ~4-8 sessions and attempt to establish a HEP for him to do, as he has interest in joining the pool.    [place]: Psychologist, sport and exercise and update goal, trial AFO w/ and w/o anterior guard, cardiorespiratory endurance as able, initial HEP with NMR for L hemiparesis  Jill Alexanders Lilleigh Hechavarria, PT, DPT 08/28/2022, 8:47 AM  Check all possible  CPT codes: 62952 - PT Re-evaluation, 97110- Therapeutic Exercise, [phone]- Neuro Re-education, [phone] - Gait Training, [phone] - Manual Therapy, [phone] - Therapeutic Activities, and [phone] - Self Care    Check all conditions that are expected to impact treatment: {Conditions expected to impact treatment:Respiratory disorders, Diabetes mellitus, and Neurological condition and/or seizures   If treatment provided at initial evaluation, no treatment charged due to lack of authorization.

## 2022-08-30 ENCOUNTER — Encounter: Payer: Self-pay | Admitting: Physical Therapy

## 2022-08-30 ENCOUNTER — Ambulatory Visit: Payer: Medicaid Other | Admitting: Physical Therapy

## 2022-08-30 VITALS — BP 170/110 | HR 66

## 2022-08-30 DIAGNOSIS — M6281 Muscle weakness (generalized): Secondary | ICD-10-CM

## 2022-08-30 DIAGNOSIS — R2689 Other abnormalities of gait and mobility: Secondary | ICD-10-CM

## 2022-08-30 DIAGNOSIS — R2681 Unsteadiness on feet: Secondary | ICD-10-CM

## 2022-08-30 DIAGNOSIS — R269 Unspecified abnormalities of gait and mobility: Secondary | ICD-10-CM

## 2022-08-30 NOTE — Therapy (Signed)
[facility] [address] [place], [place], [address] Phone: [phone]   Fax:  [phone]  Patient Details  Name: Craig West MRN: [id number] Date of Birth: 1966-03-07 Referring Provider:  Hoy Register, MD  Encounter Date: 08/30/2022  Session was arrive no charge. Patient's BP was out of safe therapeutic range with readings as noted below. Patient states that he has missed taking his BP medication on some days and only took his medication about 20 minutes ago. Patient states that his BP was way higher when he had a stroke and doesn't think following up with his doctor is needed. Therapist explains safe ranges, need to follow up, and importance of referring to PCP to take medication as recommended. Patient did not verbally confirm that he would follow up despite recommendation. Therapist will reach out to PCP to make them aware of patient's blood pressure as well. Advised patient to go to ED if became symptomatic.   Vitals:   08/30/22 0753 08/30/22 0809  BP: (!) 176/109 (!) 170/110  Pulse: 66      Carmelia Bake, PT, DPT 08/30/2022, 8:12 AM  [place] [facility] [address] [place], [place], [address] Phone: [phone]   Fax:  [phone]

## 2022-09-02 ENCOUNTER — Other Ambulatory Visit: Payer: Self-pay

## 2022-09-03 ENCOUNTER — Ambulatory Visit: Payer: Medicaid Other | Admitting: Physical Therapy

## 2022-09-03 ENCOUNTER — Encounter: Payer: Self-pay | Admitting: *Deleted

## 2022-09-03 ENCOUNTER — Other Ambulatory Visit: Payer: Self-pay | Admitting: Pharmacist

## 2022-09-03 ENCOUNTER — Encounter: Payer: Self-pay | Admitting: Rehabilitation

## 2022-09-03 ENCOUNTER — Ambulatory Visit: Payer: Medicaid Other | Attending: Family Medicine | Admitting: Rehabilitation

## 2022-09-03 DIAGNOSIS — R2681 Unsteadiness on feet: Secondary | ICD-10-CM | POA: Insufficient documentation

## 2022-09-03 DIAGNOSIS — R269 Unspecified abnormalities of gait and mobility: Secondary | ICD-10-CM | POA: Diagnosis not present

## 2022-09-03 DIAGNOSIS — M6281 Muscle weakness (generalized): Secondary | ICD-10-CM | POA: Diagnosis not present

## 2022-09-03 DIAGNOSIS — R2689 Other abnormalities of gait and mobility: Secondary | ICD-10-CM | POA: Insufficient documentation

## 2022-09-03 DIAGNOSIS — M1A00X0 Idiopathic chronic gout, unspecified site, without tophus (tophi): Secondary | ICD-10-CM

## 2022-09-03 DIAGNOSIS — R293 Abnormal posture: Secondary | ICD-10-CM | POA: Diagnosis not present

## 2022-09-03 DIAGNOSIS — N184 Chronic kidney disease, stage 4 (severe): Secondary | ICD-10-CM

## 2022-09-03 DIAGNOSIS — I110 Hypertensive heart disease with heart failure: Secondary | ICD-10-CM

## 2022-09-03 MED ORDER — CARVEDILOL 25 MG PO TABS
25.0000 mg | ORAL_TABLET | Freq: Two times a day (BID) | ORAL | 3 refills | Status: DC
Start: 2022-09-03 — End: 2023-05-20

## 2022-09-03 MED ORDER — ATORVASTATIN CALCIUM 80 MG PO TABS
80.0000 mg | ORAL_TABLET | Freq: Every day | ORAL | 3 refills | Status: DC
Start: 2022-09-03 — End: 2023-05-20

## 2022-09-03 MED ORDER — FUROSEMIDE 20 MG PO TABS
60.0000 mg | ORAL_TABLET | Freq: Two times a day (BID) | ORAL | 1 refills | Status: DC
Start: 2022-09-03 — End: 2023-02-24

## 2022-09-03 MED ORDER — ALLOPURINOL 100 MG PO TABS
100.0000 mg | ORAL_TABLET | Freq: Every day | ORAL | 1 refills | Status: DC
Start: 2022-09-03 — End: 2023-05-20

## 2022-09-03 MED ORDER — AMLODIPINE BESYLATE 10 MG PO TABS: 10.0000 mg | ORAL_TABLET | Freq: Every day | ORAL | 3 refills | Status: DC

## 2022-09-03 MED ORDER — HYDRALAZINE HCL 100 MG PO TABS: 100.0000 mg | ORAL_TABLET | Freq: Three times a day (TID) | ORAL | 1 refills | Status: DC

## 2022-09-03 MED ORDER — ISOSORBIDE MONONITRATE ER 60 MG PO TB24: 60.0000 mg | ORAL_TABLET | Freq: Every day | ORAL | 1 refills | Status: DC

## 2022-09-03 MED ORDER — PANTOPRAZOLE SODIUM 40 MG PO TBEC: 40.0000 mg | DELAYED_RELEASE_TABLET | Freq: Every day | ORAL | 1 refills | Status: AC

## 2022-09-03 MED ORDER — ALBUTEROL SULFATE (2.5 MG/3ML) 0.083% IN NEBU: 2.5000 mg | INHALATION_SOLUTION | Freq: Four times a day (QID) | RESPIRATORY_TRACT | 2 refills | Status: AC | PRN

## 2022-09-03 NOTE — Therapy (Signed)
OUTPATIENT PHYSICAL THERAPY NEURO TREATMENT   Patient Name: Craig West MRN: [id number] DOB:1965-07-07, 57 y.o., male Today's Date: 09/03/2022   PCP: Hoy Register, MD REFERRING PROVIDER: Hoy Register, MD  END OF SESSION:  PT End of Session - 09/03/22 0909     Visit Number 3    Number of Visits 17    Date for PT Re-Evaluation 10/29/22    Authorization Type [place] Medicaid    Progress Note Due on Visit 10    PT Start Time 1010    PT Stop Time 1105    PT Time Calculation (min) 55 min    Equipment Utilized During Treatment Gait belt    Activity Tolerance Patient tolerated treatment well    Behavior During Therapy WFL for tasks assessed/performed              Past Medical History:  Diagnosis Date   Asthma    CHF (congestive heart failure) (HCC)    CKD (chronic kidney disease), stage IV (HCC)    COVID-19 virus infection 04/2019   Diabetes mellitus without complication (HCC)    Fatigue 11/23/2020   Gout    Hypertension    Leg heaviness 11/23/2020   NSVT (nonsustained ventricular tachycardia) (HCC) 11/23/2020   Rectal cancer [facility])    Past Surgical History:  Procedure Laterality Date   BIOPSY  01/11/2020   Procedure: BIOPSY;  Surgeon: Hilarie Fredrickson, MD;  Location: [facility] ENDOSCOPY;  Service: Endoscopy;;   COLONOSCOPY WITH PROPOFOL N/A 01/11/2020   Procedure: COLONOSCOPY WITH PROPOFOL;  Surgeon: Hilarie Fredrickson, MD;  Location: [facility] ENDOSCOPY;  Service: Endoscopy;  Laterality: N/A;   NO PAST SURGERIES     POLYPECTOMY  01/11/2020   Procedure: POLYPECTOMY;  Surgeon: Hilarie Fredrickson, MD;  Location: [facility] ENDOSCOPY;  Service: Endoscopy;;   SUBMUCOSAL TATTOO INJECTION  01/11/2020   Procedure: SUBMUCOSAL TATTOO INJECTION;  Surgeon: Hilarie Fredrickson, MD;  Location: [facility] ENDOSCOPY;  Service: Endoscopy;;   Patient Active Problem List   Diagnosis Date Noted   Hemiparesis affecting left side as late effect of stroke (HCC) 08/05/2022   Stroke (HCC) 05/05/2022   Hemorrhagic stroke (HCC)  05/05/2022   Hypertensive emergency 05/05/2022   Nontraumatic intracerebral hemorrhage (HCC) 05/05/2022   BPH (benign prostatic hyperplasia) 03/27/2021   Fatigue 11/23/2020   Leg heaviness 11/23/2020   NSVT (nonsustained ventricular tachycardia) (HCC) 11/23/2020   Diabetes mellitus without complication (HCC)    CKD (chronic kidney disease), stage IV (HCC)    PICC (peripherally inserted central catheter) in place 03/27/2020   Rectal cancer (HCC) 01/27/2020   Benign neoplasm of ascending colon    Benign neoplasm of descending colon    Benign neoplasm of sigmoid colon    Rectal mass    Rectal bleeding    Iron deficiency anemia    Chronic diastolic heart failure (HCC)    Hyperkalemia    Volume overload 01/06/2020   Demand ischemia    COVID-19 virus infection 04/2019   Obesity (BMI 30.0-34.9) 12/09/2017   Non compliance w medication regimen 04/18/2017   Gout 08/02/2016   Diabetic neuropathy (HCC) 08/02/2016   Hyperlipidemia 10/24/2015   Asthma 10/16/2015   Type 2 diabetes mellitus without complication (HCC) 06/13/2014   Essential hypertension 06/13/2014   Smoker 06/13/2014    ONSET DATE: 08/05/2022 (referral)  REFERRING DIAG: W09.811 (ICD-10-CM) - Hemiparesis affecting left side as late effect of stroke (HCC)  THERAPY DIAG:  Unsteadiness on feet  Muscle weakness (generalized)  Other abnormalities of gait and mobility  Abnormality of gait and mobility  Abnormal posture  Rationale for Evaluation and Treatment: Rehabilitation  SUBJECTIVE:                                                                                                                                                                                             SUBJECTIVE STATEMENT: Pt would like to work on walking better and wants to walk in the pool.   Pt accompanied by: self  PERTINENT HISTORY: left-sided weakness and CT scan revealed hemorrhagic stroke thought to be ischemic with conversion 05/05/2022,  hypertension, type 2 diabetes mellitus, asthma, gout, stage IV chronic kidney disease, Hypertensive Heart Disease, Rectal carcinoma diagnosed in 12/2018 (completed chemotherapy and radiation), s/p Low anterior resection with diverting Colostomy at [facility] on 12/06/20 with subsequent reversal of colostomy PAIN:  Are you having pain? No  PRECAUTIONS: Fall  WEIGHT BEARING RESTRICTIONS: No  FALLS: Has patient fallen in last 6 months? No  LIVING ENVIRONMENT: Lives with:  lives with friends Lives in: House/apartment Stairs: No Has following equipment at home: Single point cane, Environmental consultant - 2 wheeled, shower chair, and Grab bars  PLOF: Independent - On Disability prior to stroke; Patient states he could run/move freely prior to the stroke  PATIENT GOALS: "I want to run."   OBJECTIVE:   DIAGNOSTIC FINDINGS:   05/07/2022 MR Brain wo Contrasts: IMPRESSION: 1. Right thalamic hematoma is stable since 05/05/2022, estimated blood volume of 3 mL. Regional edema but no significant intracranial mass effect. And no IVH, ventriculomegaly or other complicating features. 2. Negative motion degraded intracranial MRA. And evidence of chronic small vessel disease, with other chronic microhemorrhages in the left thalamus, left pons, and also the deep left cerebellum. Suspect some encephalomalacia in the cerebellum, and also some chronic Wallerian degeneration in the right brainstem. 3. No other acute intracranial abnormality.  05/06/2024 CT Head w/o Contrast: IMPRESSION: 1. No significant interval change in size and morphology of intraparenchymal hematoma centered at the right thalamus. Mild localized edema without significant regional mass effect. 2. No other new acute intracranial abnormality.  05/05/2022 CT Head wo Contrast: IMPRESSION: Acute hypertensive type hemorrhage in the right thalamus measuring 2.6 mL.   COGNITION: Overall cognitive status: Within functional limits for tasks  assessed   SENSATION: Numbness on L hemiparetic side  COORDINATION: Mild dysmetria noted on hemiparetic side on L  EDEMA:  WNL  MUSCLE TONE: LLE: Modifed Ashworth Scale 1+ = Slight increase in muscle tone, manifested by a catch, followed by minimal resistance throughout the remainder (less than half) of the ROM  POSTURE: rounded shoulders and forward head  LOWER EXTREMITY ROM:  L hemiparesis results in decreased ROM on L side primarily   LOWER EXTREMITY MMT:    Held due to presence of tone on LLE but increased weakness noted on this side functionally with sit to stand and bias for RLE, grossly 4+/5 on RLE in hip flexor, knee extensors, knee flexors, dorsiflexors  TODAY'S TREATMENT:    Patient seen for aquatic therapy today.  Treatment took place in water 3.6-4.0 feet deep depending upon activity.  Pt entered and exited the pool via stairs using rails in step to pattern.  Pool temp at approx 92 deg.  Once pt in pool and began walking, he continued to lose balance, needing min A to get to wall.    Warm up:  Forward walking x approx 18' x 4 laps, backwards walking x 4 laps, side stepping x 4 laps, using large bar bells for forwards/backwards and HHA for side stepping.  Pt with most difficulty backwards walking.  He needed min A throughout with tactile and verbal cues for improving L lateral weight shift and activating LLE in stance for more controlled R step.  Also needed cues during backwards walking for improved L posterior weight shift.  He did very well with side stepping each direction.    Standing near wall for RUE support: marching in place x 20 reps with verbal and tactile cues for improved L lateral weight shift and hip extension activation in L stance.  With LLE in stance, moving RLE into hip flex (with knee in ext) x 10 reps, hip ext (with knee ext) x 10 reps.  Moving LLE into hip flex and hip extension (without stopping in middle and with knee flex).  Standing L knee flex x  10 reps.    Sit<>stands from pool bench with step under feet x 10 reps with PT on pts left to keep him shifting at midline and to prevent knee hyperextension as well as tactile cues for increased hip extension with less trunk extension.  Forward step ups with LLE in stance, bringing RLE into march x 10 reps with single UE support on wall, lateral step ups x 10 reps each direction with UE support on wall.  Light facilitation from PT for upright posture and improved L weight shift.  Note some difficulty placing foot due to sensory issues, but seemed to improve with repetition. Ended with pt performing alt step jumps for coordination x 10 reps with single UE support on wall and cues for improved "push off " from LEs rather than using arm.    Core strengthening:  Assisted pt to deep end of pool and assisted with placing large yellow noodle through legs for "saddle sit" on noodle.  Provided larger barbells for support and had pt work to remain in stable/balanced position.  Needs intermittent mod A to maintain/regain balance.  Had him start to bicycle legs which required more support.  Will continue to work on this in future sessions.    Had lengthy discussion with pt about AD and AFOs.  Note that primary PTs have mentioned this to him and he is very resistant.  He reports that when he was in rehab, they told him he'd never walk without the walker and so he has been adamant about walking without a device.  Educated that while he may be ABLE to do this, its re-enforcing poor quality of gait mechanics that can cause issues such as back pain (which he endorses).  PT educated that sometimes we have patients use devices (even  if just a cane) to emphasize improved quality of gait rather than them "falling down."  Pt verbalized understanding and seemed possibly more open to this on land.  Also discussed the purpose of aquatic PT and how its used in conjuncture with land and the progression to land only for higher level  challenges.  Pt verbalized understanding and would like to continue with aquatic PT.  Will let primary PT know and schedule him through June and into July.       Pt requires buoyancy of water for support for reduced fall risk and for unloading/reduced stress on joints (L ankle and knee) as pt able to tolerate increased standing and ambulation in water compared to that on land; viscosity of water is needed for resistance for strengthening and current of water provides perturbations for challenge for balance training             PATIENT EDUCATION: Education details: see above Person educated: Patient Education method: Chief Technology Officer Education comprehension: verbalized understanding and needs further education  HOME EXERCISE PROGRAM: To be provided  GOALS: Goals reviewed with patient? Yes  SHORT TERM GOALS: Target date: 09/17/2022  Patient will demonstrate 100% compliance with initial HEP to continue to progress between physical therapy sessions.   Baseline: To be provided Goal status: INITIAL  2. Patient will complete 1x sit to stand without UE use to demonstrate improved LE strength and decreased falls risk. Baseline: Requires bilateral use of UE Goal status: INITIAL  3.  Patient will improve gait speed to 0.77 m/s with LRAD to indicate improvement towards the level of community ambulator in order to participate more easily in activities outside of the home.   Baseline: 0.67 m/s without AD Goal status: INITIAL  4.  Patient will tolerate entire duration of with LRAD to indicated improved cardiorespiratory endurance for shopping outside of home.  Baseline: tolerated first 3 minutes (478 feet) Goal status: INITIAL  5.  Berg to be assessed/goal written as indicated Baseline: To be assessed Goal status: INITIAL  LONG TERM GOALS: Target date: 10/29/2022  Patient will report demonstrate independence with final HEP in order to maintain current gains and  continue to progress after physical therapy discharge.   Baseline: To be provided Goal status: INITIAL  2.  Patient will complete 5xSTS without UE to indicate improved LE strength.  Baseline: 13.34" with bilateral UE support (SBA) Goal status: INITIAL  3.  Patient will improve gait speed to 0.87 m/s with LRAD to indicate improvement to the level of community ambulator in order to participate more easily in activities outside of the home.   Baseline: 0.67 m/s without AD  Goal status: INITIAL  4.  Patient will improve with LRAD by 50 meters to achieve age reported norms for aerobic capacity and endurance in order to participate more easily in daily walking tasks.   Baseline: 145.6 meters without AD (CGA with minA x 1 due to LOB around curve) Goal status: INITIAL  5.  Patient will improve FOTO score to 74 to achieve predicted improvements in functional mobility due to skilled physical therapy interventions to increase safety with and participation in daily activities.  Baseline: 60 Goal status: INITIAL  6.  Berg to be assessed/goal written as indicated Baseline: To be assessed Goal status: INITIAL  ASSESSMENT:  CLINICAL IMPRESSION: Skilled session in pool today at [facility] in order to work on improved endurance, buoyancy of water to challenge balance with reduced fall risk, viscosity of water to strengthen  and current to allow for external perturbations.  Pt needing min to mod A for most tasks today, but walking did improve throughout session with more narrowed BOS, more controlled R step and more upright posture.  Will plan to schedule him out for a few weeks in the pool.    OBJECTIVE IMPAIRMENTS: Abnormal gait, decreased activity tolerance, decreased balance, decreased endurance, difficulty walking, decreased ROM, decreased strength, decreased safety awareness, impaired sensation, and impaired tone.   ACTIVITY LIMITATIONS: carrying, standing, squatting, and locomotion  level  PARTICIPATION LIMITATIONS: cleaning, shopping, and community activity  PERSONAL FACTORS: Behavior pattern, Past/current experiences, and 3+ comorbidities: see above  are also affecting patient's functional outcome.   REHAB POTENTIAL: Fair Patient resistant to trial of AD during today's session, otherwise patient strongly motivated to defy limitations  CLINICAL DECISION MAKING: Evolving/moderate complexity  EVALUATION COMPLEXITY: Moderate  PLAN:  PT FREQUENCY: 2x/week  PT DURATION: 8 weeks  PLANNED INTERVENTIONS: Therapeutic exercises, Therapeutic activity, Neuromuscular re-education, Balance training, Gait training, Patient/Family education, Self Care, Joint mobilization, DME instructions, Manual therapy, and Re-evaluation  PLAN FOR NEXT SESSION:  Pool:  L NMR, step ups, step strategy, coordination  Land: Sarah-Im adding him to my schedule for the next several weeks.  Take a look and see if this is ok.  He was ok with it.  Will you go through his schedule on Friday with him and take out a land appt of his choice.  Next week I'm full so he will have 2 land appts.  finish berg and update goal, trial AFO w/ and w/o anterior guard, cardiorespiratory endurance as able, initial HEP with NMR for L hemiparesis   Harriet Butte, PT, MPT [facility] [address] [place], [place], [address] Phone: [phone]   Fax:  [phone] 09/03/22, 11:35 AM   Check all possible CPT codes: [phone] - PT Re-evaluation, 97110- Therapeutic Exercise, [phone]- Neuro Re-education, [phone] - Gait Training, [phone] - Manual Therapy, [phone] - Therapeutic Activities, and [phone] - Self Care    Check all conditions that are expected to impact treatment: {Conditions expected to impact treatment:Respiratory disorders, Diabetes mellitus, and Neurological condition and/or seizures   If treatment provided at initial evaluation, no treatment charged due to lack of authorization.

## 2022-09-04 ENCOUNTER — Other Ambulatory Visit: Payer: Self-pay | Admitting: Family Medicine

## 2022-09-04 NOTE — Telephone Encounter (Signed)
Requested Prescriptions  Pending Prescriptions Disp Refills   colchicine 0.6 MG tablet [Pharmacy Med Name: COLCHICINE 0.6 MG TABS 0.6 Tablet] 90 tablet 0    Sig: TAKE 1 TABLET BY MOUTH AS NEEDED TAKE 1 TABLET BY MOUTH AT THE ONSET OF A GOUT ATTACK WITH NEXT DOSE NO SOONER THAN 3 DAYS LATER     Endocrinology:  Gout Agents - colchicine Failed - 09/04/2022  7:35 AM      Failed - Cr in normal range and within 360 days    Creatinine  Date Value Ref Range Status  07/24/2022 3.94 (H) 0.61 - 1.24 mg/dL Final   Creat  Date Value Ref Range Status  10/23/2015 1.18 0.70 - 1.33 mg/dL Final    Comment:      For patients > or = 59 years of age: The upper reference limit for Creatinine is approximately 13% higher for people identified as African-American.      Creatinine,U  Date Value Ref Range Status  09/19/2009 108.2 mg/dL Final    Comment:    See lab report for associated comment(s)   Creatinine, Urine  Date Value Ref Range Status  10/23/2015 348 20 - 370 mg/dL Final    Comment:    Result repeated and verified. Result confirmed by automatic dilution.          Failed - AST in normal range and within 360 days    AST  Date Value Ref Range Status  07/24/2022 14 (L) 15 - 41 U/L Final         Passed - ALT in normal range and within 360 days    ALT  Date Value Ref Range Status  07/24/2022 8 0 - 44 U/L Final         Passed - Valid encounter within last 12 months    Recent Outpatient Visits           1 month ago Hypertensive heart disease with chronic diastolic congestive heart failure (HCC)   [place] [facility] [place], [place], MD   10 months ago Type 2 diabetes mellitus with stage 4 chronic kidney disease, without long-term current use of insulin (HCC)   [facility] [place], [place], MD   1 year ago Hypertension in chronic kidney disease due to type 2 diabetes mellitus [facility])   [place] [facility] [place], [place] L, RPH-CPP   1 year ago Type 2 diabetes mellitus with stage 4 chronic kidney disease, without long-term current use of insulin (HCC)   [place] [facility] [place], Odette Horns, MD   1 year ago History of rectal cancer   [facility], Cari S, PA-C              Passed - CBC within normal limits and completed in the last 12 months    WBC  Date Value Ref Range Status  05/12/2022 9.2 4.0 - 10.5 K/uL Final   RBC  Date Value Ref Range Status  05/12/2022 3.89 (L) 4.22 - 5.81 MIL/uL Final   Hemoglobin  Date Value Ref Range Status  05/12/2022 10.2 (L) 13.0 - 17.0 g/dL Final  78/29/5621 30.8 (L) 13.0 - 17.7 g/dL Final   HCT  Date Value Ref Range Status  05/12/2022 32.3 (L) 39.0 - 52.0 % Final   Hematocrit  Date Value Ref Range Status  06/27/2021 34.5 (L) 37.5 - 51.0 % Final  MCHC  Date Value Ref Range Status  05/12/2022 31.6 30.0 - 36.0 g/dL Final   [facility]  Date Value Ref Range Status  05/12/2022 26.2 26.0 - 34.0 pg Final   MCV  Date Value Ref Range Status  05/12/2022 83.0 80.0 - 100.0 fL Final  06/27/2021 82 79 - 97 fL Final   No results found for: "PLTCOUNTKUC", "LABPLAT", "POCPLA" RDW  Date Value Ref Range Status  05/12/2022 17.5 (H) 11.5 - 15.5 % Final  06/27/2021 16.6 (H) 11.6 - 15.4 % Final

## 2022-09-05 NOTE — Telephone Encounter (Signed)
Form has been faxed.

## 2022-09-05 NOTE — Telephone Encounter (Signed)
[facility] with [facility] is calling back stating that they need the pt medical necessity to be faxed back to them. Also on the physician order question number [phone] need to be answered and faxed back.    fax [phone]

## 2022-09-06 ENCOUNTER — Ambulatory Visit: Payer: Medicaid Other | Admitting: Physical Therapy

## 2022-09-06 ENCOUNTER — Encounter: Payer: Self-pay | Admitting: Physical Therapy

## 2022-09-06 VITALS — BP 138/84 | HR 72

## 2022-09-06 DIAGNOSIS — R269 Unspecified abnormalities of gait and mobility: Secondary | ICD-10-CM

## 2022-09-06 DIAGNOSIS — R2689 Other abnormalities of gait and mobility: Secondary | ICD-10-CM | POA: Diagnosis not present

## 2022-09-06 DIAGNOSIS — R2681 Unsteadiness on feet: Secondary | ICD-10-CM | POA: Diagnosis not present

## 2022-09-06 DIAGNOSIS — M6281 Muscle weakness (generalized): Secondary | ICD-10-CM

## 2022-09-06 DIAGNOSIS — R293 Abnormal posture: Secondary | ICD-10-CM | POA: Diagnosis not present

## 2022-09-06 NOTE — Therapy (Addendum)
OUTPATIENT PHYSICAL THERAPY NEURO TREATMENT   Patient Name: Craig West MRN: [id number] DOB:[birth year], 57 y.o., male Today's Date: 09/06/2022   PCP: Hoy Register, MD REFERRING PROVIDER: Hoy Register, MD  END OF SESSION:  PT End of Session - 09/06/22 0800     Visit Number 4    Number of Visits 17    Date for PT Re-Evaluation 10/29/22    Authorization Type [place] [employer]    Progress Note Due on Visit 10    PT Start Time 0756    PT Stop Time 0845    PT Time Calculation (min) 49 min    Equipment Utilized During Treatment Gait belt    Activity Tolerance Patient tolerated treatment well    Behavior During Therapy WFL for tasks assessed/performed              Past Medical History:  Diagnosis Date   Asthma    CHF (congestive heart failure) (HCC)    CKD (chronic kidney disease), stage IV (HCC)    COVID-19 virus infection 04/2019   Diabetes mellitus without complication (HCC)    Fatigue 11/23/2020   Gout    Hypertension    Leg heaviness 11/23/2020   NSVT (nonsustained ventricular tachycardia) (HCC) 11/23/2020   Rectal cancer [facility])    Past Surgical History:  Procedure Laterality Date   BIOPSY  01/11/2020   Procedure: BIOPSY;  Surgeon: Hilarie Fredrickson, MD;  Location: [facility] ENDOSCOPY;  Service: Endoscopy;;   COLONOSCOPY WITH PROPOFOL N/A 01/11/2020   Procedure: COLONOSCOPY WITH PROPOFOL;  Surgeon: Hilarie Fredrickson, MD;  Location: [facility] ENDOSCOPY;  Service: Endoscopy;  Laterality: N/A;   NO PAST SURGERIES     POLYPECTOMY  01/11/2020   Procedure: POLYPECTOMY;  Surgeon: Hilarie Fredrickson, MD;  Location: [facility] ENDOSCOPY;  Service: Endoscopy;;   SUBMUCOSAL TATTOO INJECTION  01/11/2020   Procedure: SUBMUCOSAL TATTOO INJECTION;  Surgeon: Hilarie Fredrickson, MD;  Location: [facility] ENDOSCOPY;  Service: Endoscopy;;   Patient Active Problem List   Diagnosis Date Noted   Hemiparesis affecting left side as late effect of stroke (HCC) 08/05/2022   Stroke (HCC) 05/05/2022   Hemorrhagic stroke (HCC)  05/05/2022   Hypertensive emergency 05/05/2022   Nontraumatic intracerebral hemorrhage (HCC) 05/05/2022   BPH (benign prostatic hyperplasia) 03/27/2021   Fatigue 11/23/2020   Leg heaviness 11/23/2020   NSVT (nonsustained ventricular tachycardia) (HCC) 11/23/2020   Diabetes mellitus without complication (HCC)    CKD (chronic kidney disease), stage IV (HCC)    PICC (peripherally inserted central catheter) in place 03/27/2020   Rectal cancer (HCC) 01/27/2020   Benign neoplasm of ascending colon    Benign neoplasm of descending colon    Benign neoplasm of sigmoid colon    Rectal mass    Rectal bleeding    Iron deficiency anemia    Chronic diastolic heart failure (HCC)    Hyperkalemia    Volume overload 01/06/2020   Demand ischemia    COVID-19 virus infection 04/2019   Obesity (BMI 30.0-34.9) 12/09/2017   Non compliance w medication regimen 04/18/2017   Gout 08/02/2016   Diabetic neuropathy (HCC) 08/02/2016   Hyperlipidemia 10/24/2015   Asthma 10/16/2015   Type 2 diabetes mellitus without complication (HCC) 06/13/2014   Essential hypertension 06/13/2014   Smoker 06/13/2014    ONSET DATE: 08/05/2022 (referral)  REFERRING DIAG: A35.573 (ICD-10-CM) - Hemiparesis affecting left side as late effect of stroke (HCC)  THERAPY DIAG:  Unsteadiness on feet  Muscle weakness (generalized)  Other abnormalities of gait and mobility  Abnormality of gait and mobility  Rationale for Evaluation and Treatment: Rehabilitation  SUBJECTIVE:                                                                                                                                                                                             SUBJECTIVE STATEMENT: Pt reports one fall. Patient reports that he needed help to get up as the grass was slippery. Patient denies injury/hitting head. Patient addiment against trying AFO in today's session when briefly mentions. Patient expressed fear that therapists were  on commission and just trying to sell him something. Stated he had no interest in bracing or AD even if would help improve stability/relieve pain etc. Patient states that his goal is to be able to run before he finishes therapy.   Pt accompanied by: self  PERTINENT HISTORY: left-sided weakness and CT scan revealed hemorrhagic stroke thought to be ischemic with conversion 05/05/2022, hypertension, type 2 diabetes mellitus, asthma, gout, stage IV chronic kidney disease, Hypertensive Heart Disease, Rectal carcinoma diagnosed in 12/2018 (completed chemotherapy and radiation), s/p Low anterior resection with diverting Colostomy at [facility] on 12/06/20 with subsequent reversal of colostomy PAIN:  Are you having pain? No  PRECAUTIONS: Fall  WEIGHT BEARING RESTRICTIONS: No  FALLS: Has patient fallen in last 6 months? No  LIVING ENVIRONMENT: Lives with:  lives with friends Lives in: House/apartment Stairs: No Has following equipment at home: Single point cane, Environmental consultant - 2 wheeled, shower chair, and Grab bars  PLOF: Independent - On Disability prior to stroke; Patient states he could run/move freely prior to the stroke  PATIENT GOALS: "I want to run."   OBJECTIVE:   DIAGNOSTIC FINDINGS:   05/07/2022 MR Brain wo Contrasts: IMPRESSION: 1. Right thalamic hematoma is stable since 05/05/2022, estimated blood volume of 3 mL. Regional edema but no significant intracranial mass effect. And no IVH, ventriculomegaly or other complicating features. 2. Negative motion degraded intracranial MRA. And evidence of chronic small vessel disease, with other chronic microhemorrhages in the left thalamus, left pons, and also the deep left cerebellum. Suspect some encephalomalacia in the cerebellum, and also some chronic Wallerian degeneration in the right brainstem. 3. No other acute intracranial abnormality.  05/06/2024 CT Head w/o Contrast: IMPRESSION: 1. No significant interval change in size and morphology  of intraparenchymal hematoma centered at the right thalamus. Mild localized edema without significant regional mass effect. 2. No other new acute intracranial abnormality.  05/05/2022 CT Head wo Contrast: IMPRESSION: Acute hypertensive type hemorrhage in the right thalamus measuring 2.6 mL.   COGNITION: Overall cognitive status: Within functional limits for tasks assessed   SENSATION:  Numbness on L hemiparetic side  COORDINATION: Mild dysmetria noted on hemiparetic side on L  EDEMA:  WNL  MUSCLE TONE: LLE: Modifed Ashworth Scale 1+ = Slight increase in muscle tone, manifested by a catch, followed by minimal resistance throughout the remainder (less than half) of the ROM  POSTURE: rounded shoulders and forward head  LOWER EXTREMITY ROM:       L hemiparesis results in decreased ROM on L side primarily   LOWER EXTREMITY MMT:    Held due to presence of tone on LLE but increased weakness noted on this side functionally with sit to stand and bias for RLE, grossly 4+/5 on RLE in hip flexor, knee extensors, knee flexors, dorsiflexors  TODAY'S TREATMENT:    Vitals:   09/06/22 0804  BP: 138/84  Pulse: 72     [facility] PT Assessment - 09/06/22 0001       Berg Balance Test   Sit to Stand Able to stand  independently using hands    Standing Unsupported Able to stand safely 2 minutes    Sitting with Back Unsupported but Feet Supported on Floor or Stool Able to sit safely and securely 2 minutes    Stand to Sit Controls descent by using hands    Transfers Able to transfer with verbal cueing and /or supervision    Standing Unsupported with Eyes Closed Able to stand 10 seconds with supervision    Standing Unsupported with Feet Together Able to place feet together independently but unable to hold for 30 seconds    From Standing, Reach Forward with Outstretched Arm Can reach forward >12 cm safely (5")    From Standing Position, Pick up Object from Floor Able to pick up shoe safely and  easily    From Standing Position, Turn to Look Behind Over each Shoulder Needs assist to keep from losing balance and falling    Turn 360 Degrees Needs close supervision or verbal cueing    Standing Unsupported, Alternately Place Feet on Step/Stool Able to complete 4 steps without aid or supervision    Standing Unsupported, One Foot in Front Needs help to step but can hold 15 seconds    Standing on One Leg Tries to lift leg/unable to hold 3 seconds but remains standing independently    Total Score 33           Therapist discussed findings on Berg and explained how demonstrated increased risk for falls and likely need for AD. Patient verbalized understanding and stated that he still did not feels as though AD was needed.   Fall Recovery: Part practice to ensure LLE could bare weight with bench on low mat (CGA) tall kneel to half knee with UE support x 2 bilaterally (appropriate stability) 1 x Modified gower's approach from stand to floor and back up with no UE support besides use of thighs (CGA) Patient extremely rapid with movements, requires cues for pacing 1 x UE support on low mat to come to stand with use of half kneel to squat pivot to mat to simulate environment when ground was too wet to get up with other approach (CGA)  Discussed safety strategies including education on pacing and knowing where to crawl to both indoors/outdoors to pull himself up if patient were alone  NMR:  Long sit hamstring stretch to address hamstring tightness 1 x 60" recommended as option for home  Hurdle Step overs 6" between // bars with CGA:  2 x 6 hurdle step overs (step to pattern -  leading with LLE for clearance with impaired side)  3 errors on initial attempt  1 error on second attempt 2 x 6 hurdle step overs (step to pattern - leading with RLE for stance on LLE)  2 errors on initial attempt  1 error on second attempt   PATIENT EDUCATION: Education details: Sharlene Motts results, fall recovery as noted  above Person educated: Patient Education method: Explanation and Handouts Education comprehension: verbalized understanding and needs further education  HOME EXERCISE PROGRAM: To be provided  GOALS: Goals reviewed with patient? Yes  SHORT TERM GOALS: Target date: 09/17/2022  Patient will demonstrate 100% compliance with initial HEP to continue to progress between physical therapy sessions.   Baseline: To be provided Goal status: INITIAL  2. Patient will complete 1x sit to stand without UE use to demonstrate improved LE strength and decreased falls risk. Baseline: Requires bilateral use of UE Goal status: INITIAL  3.  Patient will improve gait speed to 0.77 m/s with LRAD to indicate improvement towards the level of community ambulator in order to participate more easily in activities outside of the home.   Baseline: 0.67 m/s without AD Goal status: INITIAL  4.  Patient will tolerate entire duration of with LRAD to indicated improved cardiorespiratory endurance for shopping outside of home.  Baseline: tolerated first 3 minutes (478 feet) Goal status: INITIAL  5.  Patient will improve Berg Balance score by 9 points to indicate clinically significant progress towards a decreased risk of falls and improved static stability.   Baseline: 33/56 Goal status: INITIAL  LONG TERM GOALS: Target date: 10/29/2022  Patient will report demonstrate independence with final HEP in order to maintain current gains and continue to progress after physical therapy discharge.   Baseline: To be provided Goal status: INITIAL  2.  Patient will complete 5xSTS without UE to indicate improved LE strength.  Baseline: 13.34" with bilateral UE support (SBA) Goal status: INITIAL  3.  Patient will improve gait speed to 0.87 m/s with LRAD to indicate improvement to the level of community ambulator in order to participate more easily in activities outside of the home.   Baseline: 0.67 m/s without AD  Goal  status: INITIAL  4.  Patient will improve with LRAD by 50 meters to achieve age reported norms for aerobic capacity and endurance in order to participate more easily in daily walking tasks.   Baseline: 145.6 meters without AD (CGA with minA x 1 due to LOB around curve) Goal status: INITIAL  5.  Patient will improve FOTO score to 74 to achieve predicted improvements in functional mobility due to skilled physical therapy interventions to increase safety with and participation in daily activities.  Baseline: 60 Goal status: INITIAL  6. Patient will improve Berg Balance score to 45/56 or greater to indicate a decreased risk of falls and improved static stability.   Baseline: 33/56 Goal status: INITIAL  ASSESSMENT:  CLINICAL IMPRESSION: Skilled session emphasized work on assessment of Berg in addition to fall recovery strategies and stance/limb clearance on/off LLE. Patient demonstrates increased risk for falls as indicated by Sharlene Motts results in addition to likely need for AD. Patient resistant to using AD at this time despite ongoing education. Patient able to safely perform fall recovery with CGA both with modified gower's technique and half kneel to stand with UE support. Patient demonstrates difficulty with both stance and limb clearance of LLE and will continue to address in future sessions. Continue POC.   OBJECTIVE IMPAIRMENTS: Abnormal gait, decreased  activity tolerance, decreased balance, decreased endurance, difficulty walking, decreased ROM, decreased strength, decreased safety awareness, impaired sensation, and impaired tone.   ACTIVITY LIMITATIONS: carrying, standing, squatting, and locomotion level  PARTICIPATION LIMITATIONS: cleaning, shopping, and community activity  PERSONAL FACTORS: Behavior pattern, Past/current experiences, and 3+ comorbidities: see above  are also affecting patient's functional outcome.   REHAB POTENTIAL: Fair Patient resistant to trial of AD during  today's session, otherwise patient strongly motivated to defy limitations  CLINICAL DECISION MAKING: Evolving/moderate complexity  EVALUATION COMPLEXITY: Moderate  PLAN:  PT FREQUENCY: 2x/week  PT DURATION: 8 weeks  PLANNED INTERVENTIONS: Therapeutic exercises, Therapeutic activity, Neuromuscular re-education, Balance training, Gait training, Patient/Family education, Self Care, Joint mobilization, DME instructions, Manual therapy, and Re-evaluation  PLAN FOR NEXT SESSION:  NMR with emphasis on LLE, limb clearance/stance activities, high intensity gait training with emphasis on increased endurance, AFO if patient willing/open   Maryruth Eve, PT, DPT 09/06/22, 4:45 PM   Check all possible CPT codes: 81191 - PT Re-evaluation, 97110- Therapeutic Exercise, [phone]- Neuro Re-education, [phone] - Gait Training, [phone] - Manual Therapy, 97530 - Therapeutic Activities, and [phone] - Self Care    Check all conditions that are expected to impact treatment: {Conditions expected to impact treatment:Respiratory disorders, Diabetes mellitus, and Neurological condition and/or seizures   If treatment provided at initial evaluation, no treatment charged due to lack of authorization.

## 2022-09-10 ENCOUNTER — Ambulatory Visit: Payer: Medicaid Other | Admitting: Physical Therapy

## 2022-09-10 ENCOUNTER — Encounter: Payer: Self-pay | Admitting: Physical Therapy

## 2022-09-10 VITALS — BP 92/56 | HR 54

## 2022-09-10 DIAGNOSIS — R2681 Unsteadiness on feet: Secondary | ICD-10-CM | POA: Diagnosis not present

## 2022-09-10 DIAGNOSIS — R2689 Other abnormalities of gait and mobility: Secondary | ICD-10-CM | POA: Diagnosis not present

## 2022-09-10 DIAGNOSIS — R269 Unspecified abnormalities of gait and mobility: Secondary | ICD-10-CM

## 2022-09-10 DIAGNOSIS — M6281 Muscle weakness (generalized): Secondary | ICD-10-CM

## 2022-09-10 DIAGNOSIS — R293 Abnormal posture: Secondary | ICD-10-CM | POA: Diagnosis not present

## 2022-09-10 NOTE — Therapy (Signed)
OUTPATIENT PHYSICAL THERAPY NEURO TREATMENT   Patient Name: Craig West MRN: [id number] DOB:10-22-1965, 57 y.o., male Today's Date: 09/10/2022   PCP: Hoy Register, MD REFERRING PROVIDER: Hoy Register, MD  END OF SESSION:  PT End of Session - 09/10/22 0803     Visit Number 5    Number of Visits 17    Date for PT Re-Evaluation 10/29/22    Authorization Type [place] [employer]    Progress Note Due on Visit 10    PT Start Time 0804    PT Stop Time 0858    PT Time Calculation (min) 54 min    Equipment Utilized During Treatment Gait belt    Activity Tolerance Patient tolerated treatment well    Behavior During Therapy WFL for tasks assessed/performed              Past Medical History:  Diagnosis Date   Asthma    CHF (congestive heart failure) (HCC)    CKD (chronic kidney disease), stage IV (HCC)    COVID-19 virus infection 04/2019   Diabetes mellitus without complication (HCC)    Fatigue 11/23/2020   Gout    Hypertension    Leg heaviness 11/23/2020   NSVT (nonsustained ventricular tachycardia) (HCC) 11/23/2020   Rectal cancer [facility])    Past Surgical History:  Procedure Laterality Date   BIOPSY  01/11/2020   Procedure: BIOPSY;  Surgeon: Hilarie Fredrickson, MD;  Location: [facility] ENDOSCOPY;  Service: Endoscopy;;   COLONOSCOPY WITH PROPOFOL N/A 01/11/2020   Procedure: COLONOSCOPY WITH PROPOFOL;  Surgeon: Hilarie Fredrickson, MD;  Location: [facility] ENDOSCOPY;  Service: Endoscopy;  Laterality: N/A;   NO PAST SURGERIES     POLYPECTOMY  01/11/2020   Procedure: POLYPECTOMY;  Surgeon: Hilarie Fredrickson, MD;  Location: [facility] ENDOSCOPY;  Service: Endoscopy;;   SUBMUCOSAL TATTOO INJECTION  01/11/2020   Procedure: SUBMUCOSAL TATTOO INJECTION;  Surgeon: Hilarie Fredrickson, MD;  Location: [facility] ENDOSCOPY;  Service: Endoscopy;;   Patient Active Problem List   Diagnosis Date Noted   Hemiparesis affecting left side as late effect of stroke (HCC) 08/05/2022   Stroke (HCC) 05/05/2022   Hemorrhagic stroke (HCC)  05/05/2022   Hypertensive emergency 05/05/2022   Nontraumatic intracerebral hemorrhage (HCC) 05/05/2022   BPH (benign prostatic hyperplasia) 03/27/2021   Fatigue 11/23/2020   Leg heaviness 11/23/2020   NSVT (nonsustained ventricular tachycardia) (HCC) 11/23/2020   Diabetes mellitus without complication (HCC)    CKD (chronic kidney disease), stage IV (HCC)    PICC (peripherally inserted central catheter) in place 03/27/2020   Rectal cancer (HCC) 01/27/2020   Benign neoplasm of ascending colon    Benign neoplasm of descending colon    Benign neoplasm of sigmoid colon    Rectal mass    Rectal bleeding    Iron deficiency anemia    Chronic diastolic heart failure (HCC)    Hyperkalemia    Volume overload 01/06/2020   Demand ischemia    COVID-19 virus infection 04/2019   Obesity (BMI 30.0-34.9) 12/09/2017   Non compliance w medication regimen 04/18/2017   Gout 08/02/2016   Diabetic neuropathy (HCC) 08/02/2016   Hyperlipidemia 10/24/2015   Asthma 10/16/2015   Type 2 diabetes mellitus without complication (HCC) 06/13/2014   Essential hypertension 06/13/2014   Smoker 06/13/2014    ONSET DATE: 08/05/2022 (referral)  REFERRING DIAG: W09.811 (ICD-10-CM) - Hemiparesis affecting left side as late effect of stroke (HCC)  THERAPY DIAG:  Unsteadiness on feet  Muscle weakness (generalized)  Other abnormalities of gait and mobility  Abnormality of gait and mobility  Rationale for Evaluation and Treatment: Rehabilitation  SUBJECTIVE:                                                                                                                                                                                             SUBJECTIVE STATEMENT: Patient denies falls/near falls. Denies changes to medication. Reports taking his BP medication early before the start of therapy. Patient also reports that his BP has been trending lower at home 110/59 mmHg. Denies dizziness.   Pt accompanied by:  self  PERTINENT HISTORY: left-sided weakness and CT scan revealed hemorrhagic stroke thought to be ischemic with conversion 05/05/2022, hypertension, type 2 diabetes mellitus, asthma, gout, stage IV chronic kidney disease, Hypertensive Heart Disease, Rectal carcinoma diagnosed in 12/2018 (completed chemotherapy and radiation), s/p Low anterior resection with diverting Colostomy at [facility] on 12/06/20 with subsequent reversal of colostomy PAIN:  Are you having pain? No  PRECAUTIONS: Fall  WEIGHT BEARING RESTRICTIONS: No  FALLS: Has patient fallen in last 6 months? No  LIVING ENVIRONMENT: Lives with:  lives with friends Lives in: House/apartment Stairs: No Has following equipment at home: Single point cane, Environmental consultant - 2 wheeled, shower chair, and Grab bars  PLOF: Independent - On Disability prior to stroke; Patient states he could run/move freely prior to the stroke  PATIENT GOALS: "I want to run."   OBJECTIVE:   DIAGNOSTIC FINDINGS:   05/07/2022 MR Brain wo Contrasts: IMPRESSION: 1. Right thalamic hematoma is stable since 05/05/2022, estimated blood volume of 3 mL. Regional edema but no significant intracranial mass effect. And no IVH, ventriculomegaly or other complicating features. 2. Negative motion degraded intracranial MRA. And evidence of chronic small vessel disease, with other chronic microhemorrhages in the left thalamus, left pons, and also the deep left cerebellum. Suspect some encephalomalacia in the cerebellum, and also some chronic Wallerian degeneration in the right brainstem. 3. No other acute intracranial abnormality.  05/06/2024 CT Head w/o Contrast: IMPRESSION: 1. No significant interval change in size and morphology of intraparenchymal hematoma centered at the right thalamus. Mild localized edema without significant regional mass effect. 2. No other new acute intracranial abnormality.  05/05/2022 CT Head wo Contrast: IMPRESSION: Acute hypertensive type hemorrhage  in the right thalamus measuring 2.6 mL.   COGNITION: Overall cognitive status: Within functional limits for tasks assessed   SENSATION: Numbness on L hemiparetic side  COORDINATION: Mild dysmetria noted on hemiparetic side on L  EDEMA:  WNL  MUSCLE TONE: LLE: Modifed Ashworth Scale 1+ = Slight increase in muscle tone, manifested by a catch, followed by minimal resistance throughout the remainder (less  than half) of the ROM  POSTURE: rounded shoulders and forward head  LOWER EXTREMITY ROM:       L hemiparesis results in decreased ROM on L side primarily   LOWER EXTREMITY MMT:    Held due to presence of tone on LLE but increased weakness noted on this side functionally with sit to stand and bias for RLE, grossly 4+/5 on RLE in hip flexor, knee extensors, knee flexors, dorsiflexors  TODAY'S TREATMENT:    Vitals:   09/10/22 0851 09/10/22 0852  BP: (!) 89/55 (!) 92/56  Pulse: (!) 54 (!) 54  Discussed BP levels and being weary of BP drops with medication. Discussed signs and symptoms and when to reach out to PCP about medication adjustments.   TherEx: Initiated session on SciFit lvl 4 with [place] x 7 min to work on gentle warmup, break hypertonicity tone, increase strength, and increase BP as patient sitting slightly lower then normal SLR 2 x 10 on LLE Single leg bridge on L 2 x 10 Hip flexion clearance 2 x 10  TherAct: While patient seated during hip flexion; patient began to report weakness and feeling very fatigued. Also reported low back pain. Patient had difficult time keeping eyes open. Denied dizziness. Therapist rechecked patient's BP which had dropped to 84/56 with HR 57. Therapist laid patient supine and called second therapist. Provided patient water and sat him up to drink. Patient had driven himself. Given unexplained drop in BP and change in alertness recommended that patient have EMS called or someone drive to emergency department. Patient adamantly refused  repeatedly. Patient BP changed to 92/56 mmHg. Therapist educated on concerns and fear of patient passing out. Stated against recommendation that patient leave and go to work given symptoms. Again patient refused having EMS services called. Patient BP changed to 92/56 mmHg and HR 54 bpm. Increase in alertness noted. Patient stated he wanted to walk out and leave. Therapist again advised patient to seek medical help if he did not continue to improve. Therapist escorted patient out to lobby and he left.  Vitals:   09/10/22 0851 09/10/22 0852  BP: (!) 89/55 (!) 92/56  Pulse: (!) 54 (!) 54     PATIENT EDUCATION: Education details: Education on BP as noted above Person educated: Patient Education method: Explanation Education comprehension: verbalized understanding and needs further education  HOME EXERCISE PROGRAM: Access Code: [email or web address] URL: [email or web address] Date: 09/10/2022 Prepared by: Maryruth Eve  Exercises - Single Leg Bridge  - 1 x daily - 7 x weekly - 3 sets - 12 reps - Active Straight Leg Raise with Quad Set  - 1 x daily - 7 x weekly - 3 sets - 10 reps - Seated March with Hip abduction/adduction  - 1 x daily - 7 x weekly - 3 sets - 10 reps  GOALS: Goals reviewed with patient? Yes  SHORT TERM GOALS: Target date: 09/17/2022  Patient will demonstrate 100% compliance with initial HEP to continue to progress between physical therapy sessions.   Baseline: To be provided Goal status: INITIAL  2. Patient will complete 1x sit to stand without UE use to demonstrate improved LE strength and decreased falls risk. Baseline: Requires bilateral use of UE Goal status: INITIAL  3.  Patient will improve gait speed to 0.77 m/s with LRAD to indicate improvement towards the level of community ambulator in order to participate more easily in activities outside of the home.   Baseline: 0.67 m/s without AD Goal status: INITIAL  4.  Patient will tolerate entire duration of  with LRAD to indicated improved cardiorespiratory endurance for shopping outside of home.  Baseline: tolerated first 3 minutes (478 feet) Goal status: INITIAL  5.  Patient will improve Berg Balance score by 9 points to indicate clinically significant progress towards a decreased risk of falls and improved static stability.   Baseline: 33/56 Goal status: INITIAL  LONG TERM GOALS: Target date: 10/29/2022  Patient will report demonstrate independence with final HEP in order to maintain current gains and continue to progress after physical therapy discharge.   Baseline: To be provided Goal status: INITIAL  2.  Patient will complete 5xSTS without UE to indicate improved LE strength.  Baseline: 13.34" with bilateral UE support (SBA) Goal status: INITIAL  3.  Patient will improve gait speed to 0.87 m/s with LRAD to indicate improvement to the level of community ambulator in order to participate more easily in activities outside of the home.   Baseline: 0.67 m/s without AD  Goal status: INITIAL  4.  Patient will improve with LRAD by 50 meters to achieve age reported norms for aerobic capacity and endurance in order to participate more easily in daily walking tasks.   Baseline: 145.6 meters without AD (CGA with minA x 1 due to LOB around curve) Goal status: INITIAL  5.  Patient will improve FOTO score to 74 to achieve predicted improvements in functional mobility due to skilled physical therapy interventions to increase safety with and participation in daily activities.  Baseline: 60 Goal status: INITIAL  6. Patient will improve Berg Balance score to 45/56 or greater to indicate a decreased risk of falls and improved static stability.   Baseline: 33/56 Goal status: INITIAL  ASSESSMENT:  CLINICAL IMPRESSION: Session impacted by patient's drop in blood pressure. Start of session focused on deciphering primary areas of weakness in LE. Patient in supine has difficult time maintaining  neutral hip positioning with tendency to fall into abduction indicating likely weakness in adductors. Able to perform SL bridge on LLE. Does demonstrate hamstring and glute weakness on this side but greater deficits noted with SLR with maintaining quad positioning. Indicating likely greater weakness in quads and adductors. End of session emphasized education on BP safety and monitoring patient's drop in BP. Patient refused EMS services or other emergency contact as driver to be called despite therapist recommendation. Patient provided water and monitoring until alertness improved. Therapist again advised patient seek medical services and patient refused. Continue POC.   OBJECTIVE IMPAIRMENTS: Abnormal gait, decreased activity tolerance, decreased balance, decreased endurance, difficulty walking, decreased ROM, decreased strength, decreased safety awareness, impaired sensation, and impaired tone.   ACTIVITY LIMITATIONS: carrying, standing, squatting, and locomotion level  PARTICIPATION LIMITATIONS: cleaning, shopping, and community activity  PERSONAL FACTORS: Behavior pattern, Past/current experiences, and 3+ comorbidities: see above  are also affecting patient's functional outcome.   REHAB POTENTIAL: Fair Patient resistant to trial of AD during today's session, otherwise patient strongly motivated to defy limitations  CLINICAL DECISION MAKING: Evolving/moderate complexity  EVALUATION COMPLEXITY: Moderate  PLAN:  PT FREQUENCY: 2x/week  PT DURATION: 8 weeks  PLANNED INTERVENTIONS: Therapeutic exercises, Therapeutic activity, Neuromuscular re-education, Balance training, Gait training, Patient/Family education, Self Care, Joint mobilization, DME instructions, Manual therapy, and Re-evaluation  PLAN FOR NEXT SESSION:  NMR with emphasis on LLE, limb clearance/stance activities, high intensity gait training with emphasis on increased endurance, AFO if patient willing/open; monitor BP did patient  follow up, reprint HEP and provide copy for patient as unable to  do this session  Maryruth Eve, PT, DPT 09/10/22, 9:25 AM   Check all possible CPT codes: 09811 - PT Re-evaluation, 97110- Therapeutic Exercise, [phone]- Neuro Re-education, [phone] - Gait Training, [phone] - Manual Therapy, [phone] - Therapeutic Activities, and [phone] - Self Care    Check all conditions that are expected to impact treatment: {Conditions expected to impact treatment:Respiratory disorders, Diabetes mellitus, and Neurological condition and/or seizures   If treatment provided at initial evaluation, no treatment charged due to lack of authorization.

## 2022-09-13 ENCOUNTER — Ambulatory Visit: Payer: Medicaid Other | Admitting: Physical Therapy

## 2022-09-13 VITALS — BP 126/78 | HR 53

## 2022-09-13 DIAGNOSIS — R2681 Unsteadiness on feet: Secondary | ICD-10-CM | POA: Diagnosis not present

## 2022-09-13 DIAGNOSIS — R269 Unspecified abnormalities of gait and mobility: Secondary | ICD-10-CM | POA: Diagnosis not present

## 2022-09-13 DIAGNOSIS — M6281 Muscle weakness (generalized): Secondary | ICD-10-CM

## 2022-09-13 DIAGNOSIS — R2689 Other abnormalities of gait and mobility: Secondary | ICD-10-CM

## 2022-09-13 DIAGNOSIS — R293 Abnormal posture: Secondary | ICD-10-CM | POA: Diagnosis not present

## 2022-09-13 NOTE — Therapy (Signed)
OUTPATIENT PHYSICAL THERAPY NEURO TREATMENT   Patient Name: Craig West MRN: [id number] DOB:23-Oct-1965, 57 y.o., male Today's Date: 09/13/2022   PCP: Hoy Register, MD REFERRING PROVIDER: Hoy Register, MD  END OF SESSION:  PT End of Session - 09/13/22 0802     Visit Number 6    Number of Visits 17    Date for PT Re-Evaluation 10/29/22    Authorization Type [place] Medicaid    Progress Note Due on Visit 10    PT Start Time 0800    PT Stop Time 0849    PT Time Calculation (min) 49 min    Equipment Utilized During Treatment Gait belt;Other (comment)   Bioness   Activity Tolerance Patient tolerated treatment well    Behavior During Therapy [facility] for tasks assessed/performed               Past Medical History:  Diagnosis Date   Asthma    CHF (congestive heart failure) (HCC)    CKD (chronic kidney disease), stage IV (HCC)    COVID-19 virus infection 04/2019   Diabetes mellitus without complication (HCC)    Fatigue 11/23/2020   Gout    Hypertension    Leg heaviness 11/23/2020   NSVT (nonsustained ventricular tachycardia) (HCC) 11/23/2020   Rectal cancer [facility])    Past Surgical History:  Procedure Laterality Date   BIOPSY  01/11/2020   Procedure: BIOPSY;  Surgeon: Hilarie Fredrickson, MD;  Location: [facility] ENDOSCOPY;  Service: Endoscopy;;   COLONOSCOPY WITH PROPOFOL N/A 01/11/2020   Procedure: COLONOSCOPY WITH PROPOFOL;  Surgeon: Hilarie Fredrickson, MD;  Location: [facility] ENDOSCOPY;  Service: Endoscopy;  Laterality: N/A;   NO PAST SURGERIES     POLYPECTOMY  01/11/2020   Procedure: POLYPECTOMY;  Surgeon: Hilarie Fredrickson, MD;  Location: [facility] ENDOSCOPY;  Service: Endoscopy;;   SUBMUCOSAL TATTOO INJECTION  01/11/2020   Procedure: SUBMUCOSAL TATTOO INJECTION;  Surgeon: Hilarie Fredrickson, MD;  Location: [facility] ENDOSCOPY;  Service: Endoscopy;;   Patient Active Problem List   Diagnosis Date Noted   Hemiparesis affecting left side as late effect of stroke (HCC) 08/05/2022   Stroke (HCC) 05/05/2022    Hemorrhagic stroke (HCC) 05/05/2022   Hypertensive emergency 05/05/2022   Nontraumatic intracerebral hemorrhage (HCC) 05/05/2022   BPH (benign prostatic hyperplasia) 03/27/2021   Fatigue 11/23/2020   Leg heaviness 11/23/2020   NSVT (nonsustained ventricular tachycardia) (HCC) 11/23/2020   Diabetes mellitus without complication (HCC)    CKD (chronic kidney disease), stage IV (HCC)    PICC (peripherally inserted central catheter) in place 03/27/2020   Rectal cancer (HCC) 01/27/2020   Benign neoplasm of ascending colon    Benign neoplasm of descending colon    Benign neoplasm of sigmoid colon    Rectal mass    Rectal bleeding    Iron deficiency anemia    Chronic diastolic heart failure (HCC)    Hyperkalemia    Volume overload 01/06/2020   Demand ischemia    COVID-19 virus infection 04/2019   Obesity (BMI 30.0-34.9) 12/09/2017   Non compliance w medication regimen 04/18/2017   Gout 08/02/2016   Diabetic neuropathy (HCC) 08/02/2016   Hyperlipidemia 10/24/2015   Asthma 10/16/2015   Type 2 diabetes mellitus without complication (HCC) 06/13/2014   Essential hypertension 06/13/2014   Smoker 06/13/2014    ONSET DATE: 08/05/2022 (referral)  REFERRING DIAG: Y78.295 (ICD-10-CM) - Hemiparesis affecting left side as late effect of stroke (HCC)  THERAPY DIAG:  Unsteadiness on feet  Muscle weakness (generalized)  Other abnormalities of  gait and mobility  Rationale for Evaluation and Treatment: Rehabilitation  SUBJECTIVE:                                                                                                                                                                                             SUBJECTIVE STATEMENT: Patient reports being "okay". Did not check his BP following last session. Reports feeling as though he is "walking on wood" following last session.   Pt accompanied by: self  PERTINENT HISTORY: left-sided weakness and CT scan revealed hemorrhagic stroke  thought to be ischemic with conversion 05/05/2022, hypertension, type 2 diabetes mellitus, asthma, gout, stage IV chronic kidney disease, Hypertensive Heart Disease, Rectal carcinoma diagnosed in 12/2018 (completed chemotherapy and radiation), s/p Low anterior resection with diverting Colostomy at [facility] on 12/06/20 with subsequent reversal of colostomy PAIN:  Are you having pain? No  PRECAUTIONS: Fall  WEIGHT BEARING RESTRICTIONS: No  FALLS: Has patient fallen in last 6 months? No  LIVING ENVIRONMENT: Lives with:  lives with friends Lives in: House/apartment Stairs: No Has following equipment at home: Single point cane, Environmental consultant - 2 wheeled, shower chair, and Grab bars  PLOF: Independent - On Disability prior to stroke; Patient states he could run/move freely prior to the stroke  PATIENT GOALS: "I want to run."   OBJECTIVE:   DIAGNOSTIC FINDINGS:   05/07/2022 MR Brain wo Contrasts: IMPRESSION: 1. Right thalamic hematoma is stable since 05/05/2022, estimated blood volume of 3 mL. Regional edema but no significant intracranial mass effect. And no IVH, ventriculomegaly or other complicating features. 2. Negative motion degraded intracranial MRA. And evidence of chronic small vessel disease, with other chronic microhemorrhages in the left thalamus, left pons, and also the deep left cerebellum. Suspect some encephalomalacia in the cerebellum, and also some chronic Wallerian degeneration in the right brainstem. 3. No other acute intracranial abnormality.  05/06/2024 CT Head w/o Contrast: IMPRESSION: 1. No significant interval change in size and morphology of intraparenchymal hematoma centered at the right thalamus. Mild localized edema without significant regional mass effect. 2. No other new acute intracranial abnormality.  05/05/2022 CT Head wo Contrast: IMPRESSION: Acute hypertensive type hemorrhage in the right thalamus measuring 2.6 mL.   COGNITION: Overall cognitive status:  Within functional limits for tasks assessed   SENSATION: Numbness on L hemiparetic side  COORDINATION: Mild dysmetria noted on hemiparetic side on L  EDEMA:  WNL  MUSCLE TONE: LLE: Modifed Ashworth Scale 1+ = Slight increase in muscle tone, manifested by a catch, followed by minimal resistance throughout the remainder (less than half) of the ROM  POSTURE: rounded shoulders and forward head  LOWER EXTREMITY ROM:       L hemiparesis results in decreased ROM on L side primarily   LOWER EXTREMITY MMT:    Held due to presence of tone on LLE but increased weakness noted on this side functionally with sit to stand and bias for RLE, grossly 4+/5 on RLE in hip flexor, knee extensors, knee flexors, dorsiflexors  VITALS  Vitals:   09/13/22 0807 09/13/22 0840  BP: 121/75 126/78  Pulse: (!) 56 (!) 53     TODAY'S TREATMENT:   NMR/E-stim attended  Bioness setup to L anterior tibialis to promote increased ankle DF on LLE w/gait training using quick fit electrodes. See tablet 1 for more information   Gait pattern: step through pattern, decreased arm swing- Left, decreased step length- Right, decreased stance time- Left, and circumduction- Left Distance walked: 230' Assistive device utilized: None Level of assistance: SBA Comments: Pt had strong ankle DF response to Bioness. Noted decreased circumduction w/improved heel strike on LLE w/use of Bioness. Pt reported feeling more stable but continues to demonstrate decreased stance time on LLE w/minor knee buckling due to quad weakness, so pt in agreement to trial use of quad Bioness cuff next session to improve stability at knee.   Remainder of session spent educating pt on purpose of Bioness and the principles of neuroplasticity. Pt verbalized understanding and is hopeful to regain more strength. Pt inquiring about why he was told to use a RW when leaving rehab, so reviewed fall risk and safety precautions with pt. Encouraged pt to use his SPC  if walking for longer distances or in unlevel surfaces, as he did have a fall last week in grass. Informed pt that he is able to walk for longer distances w/the [facility] as well, improving his aerobic capacity. Pt verbalized understanding.       PATIENT EDUCATION: Education details: Initial HEP, encouragement to use cane on unlevel surfaces and for longer distances, plan to use Bioness next session  Person educated: Patient Education method: Explanation, Demonstration, and Handouts Education comprehension: verbalized understanding and needs further education  HOME EXERCISE PROGRAM: Access Code: [email or web address] URL: [email or web address] Date: 09/10/2022 Prepared by: Maryruth Eve  Exercises - Single Leg Bridge  - 1 x daily - 7 x weekly - 3 sets - 12 reps - Active Straight Leg Raise with Quad Set  - 1 x daily - 7 x weekly - 3 sets - 10 reps - Seated March with Hip abduction/adduction  - 1 x daily - 7 x weekly - 3 sets - 10 reps  GOALS: Goals reviewed with patient? Yes  SHORT TERM GOALS: Target date: 09/17/2022  Patient will demonstrate 100% compliance with initial HEP to continue to progress between physical therapy sessions.   Baseline: To be provided Goal status: INITIAL  2. Patient will complete 1x sit to stand without UE use to demonstrate improved LE strength and decreased falls risk. Baseline: Requires bilateral use of UE Goal status: INITIAL  3.  Patient will improve gait speed to 0.77 m/s with LRAD to indicate improvement towards the level of community ambulator in order to participate more easily in activities outside of the home.   Baseline: 0.67 m/s without AD Goal status: INITIAL  4.  Patient will tolerate entire duration of with LRAD to indicated improved cardiorespiratory endurance for shopping outside of home.  Baseline: tolerated first 3 minutes (478 feet) Goal status: INITIAL  5.  Patient will improve Berg Balance score by 9 points to indicate  clinically significant  progress towards a decreased risk of falls and improved static stability.   Baseline: 33/56 Goal status: INITIAL  LONG TERM GOALS: Target date: 10/29/2022  Patient will report demonstrate independence with final HEP in order to maintain current gains and continue to progress after physical therapy discharge.   Baseline: To be provided Goal status: INITIAL  2.  Patient will complete 5xSTS without UE to indicate improved LE strength.  Baseline: 13.34" with bilateral UE support (SBA) Goal status: INITIAL  3.  Patient will improve gait speed to 0.87 m/s with LRAD to indicate improvement to the level of community ambulator in order to participate more easily in activities outside of the home.   Baseline: 0.67 m/s without AD  Goal status: INITIAL  4.  Patient will improve with LRAD by 50 meters to achieve age reported norms for aerobic capacity and endurance in order to participate more easily in daily walking tasks.   Baseline: 145.6 meters without AD (CGA with minA x 1 due to LOB around curve) Goal status: INITIAL  5.  Patient will improve FOTO score to 74 to achieve predicted improvements in functional mobility due to skilled physical therapy interventions to increase safety with and participation in daily activities.  Baseline: 60 Goal status: INITIAL  6. Patient will improve Berg Balance score to 45/56 or greater to indicate a decreased risk of falls and improved static stability.   Baseline: 33/56 Goal status: INITIAL  ASSESSMENT:  CLINICAL IMPRESSION: Emphasis of skilled PT session on gait training w/use of Bioness for improved L ankle DF w/ambulation and pt education on safety w/mobility. Pt had very strong DF response w/Bioness and reported feeling more stable w/gait. Noted continued instability of L knee, so plan to use quad Bioness cuff next session. Continue to recommend pt use a SPC if walking for endurance or on unlevel surfaces, as pt fatigues  quickly and had a fall last week in grass. Pt verbalized understanding but did not agree to recommendations. Continue POC.   OBJECTIVE IMPAIRMENTS: Abnormal gait, decreased activity tolerance, decreased balance, decreased endurance, difficulty walking, decreased ROM, decreased strength, decreased safety awareness, impaired sensation, and impaired tone.   ACTIVITY LIMITATIONS: carrying, standing, squatting, and locomotion level  PARTICIPATION LIMITATIONS: cleaning, shopping, and community activity  PERSONAL FACTORS: Behavior pattern, Past/current experiences, and 3+ comorbidities: see above  are also affecting patient's functional outcome.   REHAB POTENTIAL: Fair Patient resistant to trial of AD during today's session, otherwise patient strongly motivated to defy limitations  CLINICAL DECISION MAKING: Evolving/moderate complexity  EVALUATION COMPLEXITY: Moderate  PLAN:  PT FREQUENCY: 2x/week  PT DURATION: 8 weeks  PLANNED INTERVENTIONS: Therapeutic exercises, Therapeutic activity, Neuromuscular re-education, Balance training, Gait training, Patient/Family education, Self Care, Joint mobilization, DME instructions, Manual therapy, and Re-evaluation  PLAN FOR NEXT SESSION:  Goal assessment, Trial quad Bioness cuff, NMR with emphasis on LLE, limb clearance/stance activities, high intensity gait training with emphasis on increased endurance, AFO if patient willing/open; monitor BP did patient follow up,   Josephine Igo, PT, DPT [facility] [address] [place], [place]  [address] Phone:  [phone] Fax:  [phone] 09/13/22, 8:51 AM   Check all possible CPT codes: 97164 - PT Re-evaluation, 97110- Therapeutic Exercise, [phone]- Neuro Re-education, [phone] - Gait Training, [phone] - Manual Therapy, 97530 - Therapeutic Activities, and 97535 - Self Care    Check all conditions that are expected to impact treatment: {Conditions expected to impact  treatment:Respiratory disorders, Diabetes mellitus, and Neurological condition and/or seizures  If treatment provided at initial evaluation, no treatment charged due to lack of authorization.

## 2022-09-17 ENCOUNTER — Ambulatory Visit: Payer: Medicaid Other | Admitting: Rehabilitation

## 2022-09-17 ENCOUNTER — Ambulatory Visit: Payer: Medicaid Other | Admitting: Physical Therapy

## 2022-09-17 ENCOUNTER — Encounter: Payer: Self-pay | Admitting: Rehabilitation

## 2022-09-17 DIAGNOSIS — R2681 Unsteadiness on feet: Secondary | ICD-10-CM

## 2022-09-17 DIAGNOSIS — R269 Unspecified abnormalities of gait and mobility: Secondary | ICD-10-CM

## 2022-09-17 DIAGNOSIS — R293 Abnormal posture: Secondary | ICD-10-CM | POA: Diagnosis not present

## 2022-09-17 DIAGNOSIS — R2689 Other abnormalities of gait and mobility: Secondary | ICD-10-CM

## 2022-09-17 DIAGNOSIS — M6281 Muscle weakness (generalized): Secondary | ICD-10-CM

## 2022-09-17 NOTE — Therapy (Addendum)
OUTPATIENT PHYSICAL THERAPY NEURO TREATMENT   Patient Name: Craig West MRN: [id number] DOB:02/06/1966, 57 y.o., male Today's Date: 09/17/2022   PCP: Hoy Register, MD REFERRING PROVIDER: Hoy Register, MD  END OF SESSION:  PT End of Session - 09/17/22 7564     Visit Number 7    Number of Visits 17    Date for PT Re-Evaluation 10/29/22    Authorization Type [place] [employer]    Progress Note Due on Visit 10    PT Start Time 1016    PT Stop Time 1100    PT Time Calculation (min) 44 min    Equipment Utilized During Treatment Gait belt;Other (comment)   Bioness   Activity Tolerance Patient tolerated treatment well    Behavior During Therapy [facility] for tasks assessed/performed              Past Medical History:  Diagnosis Date   Asthma    CHF (congestive heart failure) (HCC)    CKD (chronic kidney disease), stage IV (HCC)    COVID-19 virus infection 04/2019   Diabetes mellitus without complication (HCC)    Fatigue 11/23/2020   Gout    Hypertension    Leg heaviness 11/23/2020   NSVT (nonsustained ventricular tachycardia) (HCC) 11/23/2020   Rectal cancer [facility])    Past Surgical History:  Procedure Laterality Date   BIOPSY  01/11/2020   Procedure: BIOPSY;  Surgeon: Hilarie Fredrickson, MD;  Location: [facility] ENDOSCOPY;  Service: Endoscopy;;   COLONOSCOPY WITH PROPOFOL N/A 01/11/2020   Procedure: COLONOSCOPY WITH PROPOFOL;  Surgeon: Hilarie Fredrickson, MD;  Location: [facility] ENDOSCOPY;  Service: Endoscopy;  Laterality: N/A;   NO PAST SURGERIES     POLYPECTOMY  01/11/2020   Procedure: POLYPECTOMY;  Surgeon: Hilarie Fredrickson, MD;  Location: [facility] ENDOSCOPY;  Service: Endoscopy;;   SUBMUCOSAL TATTOO INJECTION  01/11/2020   Procedure: SUBMUCOSAL TATTOO INJECTION;  Surgeon: Hilarie Fredrickson, MD;  Location: [facility] ENDOSCOPY;  Service: Endoscopy;;   Patient Active Problem List   Diagnosis Date Noted   Hemiparesis affecting left side as late effect of stroke (HCC) 08/05/2022   Stroke (HCC) 05/05/2022    Hemorrhagic stroke (HCC) 05/05/2022   Hypertensive emergency 05/05/2022   Nontraumatic intracerebral hemorrhage (HCC) 05/05/2022   BPH (benign prostatic hyperplasia) 03/27/2021   Fatigue 11/23/2020   Leg heaviness 11/23/2020   NSVT (nonsustained ventricular tachycardia) (HCC) 11/23/2020   Diabetes mellitus without complication (HCC)    CKD (chronic kidney disease), stage IV (HCC)    PICC (peripherally inserted central catheter) in place 03/27/2020   Rectal cancer (HCC) 01/27/2020   Benign neoplasm of ascending colon    Benign neoplasm of descending colon    Benign neoplasm of sigmoid colon    Rectal mass    Rectal bleeding    Iron deficiency anemia    Chronic diastolic heart failure (HCC)    Hyperkalemia    Volume overload 01/06/2020   Demand ischemia    COVID-19 virus infection 04/2019   Obesity (BMI 30.0-34.9) 12/09/2017   Non compliance w medication regimen 04/18/2017   Gout 08/02/2016   Diabetic neuropathy (HCC) 08/02/2016   Hyperlipidemia 10/24/2015   Asthma 10/16/2015   Type 2 diabetes mellitus without complication (HCC) 06/13/2014   Essential hypertension 06/13/2014   Smoker 06/13/2014    ONSET DATE: 08/05/2022 (referral)  REFERRING DIAG: P32.951 (ICD-10-CM) - Hemiparesis affecting left side as late effect of stroke (HCC)  THERAPY DIAG:  Unsteadiness on feet  Muscle weakness (generalized)  Other abnormalities of gait  and mobility  Abnormality of gait and mobility  Abnormal posture  Rationale for Evaluation and Treatment: Rehabilitation  SUBJECTIVE:                                                                                                                                                                                             SUBJECTIVE STATEMENT: Pt reports he liked the Bioness "okay" but does want to try with the thigh cuff next land visit.  Tolerated last pool session well.   Pt accompanied by: self  PERTINENT HISTORY: left-sided weakness and  CT scan revealed hemorrhagic stroke thought to be ischemic with conversion 05/05/2022, hypertension, type 2 diabetes mellitus, asthma, gout, stage IV chronic kidney disease, Hypertensive Heart Disease, Rectal carcinoma diagnosed in 12/2018 (completed chemotherapy and radiation), s/p Low anterior resection with diverting Colostomy at [facility] on 12/06/20 with subsequent reversal of colostomy PAIN:  Are you having pain? No  PRECAUTIONS: Fall  WEIGHT BEARING RESTRICTIONS: No  FALLS: Has patient fallen in last 6 months? No  LIVING ENVIRONMENT: Lives with:  lives with friends Lives in: House/apartment Stairs: No Has following equipment at home: Single point cane, Environmental consultant - 2 wheeled, shower chair, and Grab bars  PLOF: Independent - On Disability prior to stroke; Patient states he could run/move freely prior to the stroke  PATIENT GOALS: "I want to run."   OBJECTIVE:   DIAGNOSTIC FINDINGS:   05/07/2022 MR Brain wo Contrasts: IMPRESSION: 1. Right thalamic hematoma is stable since 05/05/2022, estimated blood volume of 3 mL. Regional edema but no significant intracranial mass effect. And no IVH, ventriculomegaly or other complicating features. 2. Negative motion degraded intracranial MRA. And evidence of chronic small vessel disease, with other chronic microhemorrhages in the left thalamus, left pons, and also the deep left cerebellum. Suspect some encephalomalacia in the cerebellum, and also some chronic Wallerian degeneration in the right brainstem. 3. No other acute intracranial abnormality.  05/06/2024 CT Head w/o Contrast: IMPRESSION: 1. No significant interval change in size and morphology of intraparenchymal hematoma centered at the right thalamus. Mild localized edema without significant regional mass effect. 2. No other new acute intracranial abnormality.  05/05/2022 CT Head wo Contrast: IMPRESSION: Acute hypertensive type hemorrhage in the right thalamus measuring 2.6 mL.    COGNITION: Overall cognitive status: Within functional limits for tasks assessed   SENSATION: Numbness on L hemiparetic side  COORDINATION: Mild dysmetria noted on hemiparetic side on L  EDEMA:  WNL  MUSCLE TONE: LLE: Modifed Ashworth Scale 1+ = Slight increase in muscle tone, manifested by a catch, followed by minimal resistance throughout the remainder (less than half) of the  ROM  POSTURE: rounded shoulders and forward head  LOWER EXTREMITY ROM:       L hemiparesis results in decreased ROM on L side primarily   LOWER EXTREMITY MMT:    Held due to presence of tone on LLE but increased weakness noted on this side functionally with sit to stand and bias for RLE, grossly 4+/5 on RLE in hip flexor, knee extensors, knee flexors, dorsiflexors  TODAY'S TREATMENT:    Patient seen for aquatic therapy today.  Treatment took place in water 3.6-4.0 feet deep depending upon activity.  Pt entered and exited the pool via stairs using rails in step to pattern.  Pool temp at approx 92 deg.     Warm up:  Forward walking x approx 18' x 4 laps, backwards walking x 4 laps, both directions was able to ambulate without external support but PT did provide light facilitation for improved L lateral weight shift and light tapping for improved proximal hip control in L stance.    Performed side stepping today, holding small barballs, moving arms in abd when legs abd, performing squat, then adducting legs and arms at same time.  Needed intermittent min A to maintain balance esp with squat and needed continued cues for sequence throughout but did improve.  Attempted forward march, with pt holding small bar bells however he went quickly and needed more support so switched to larger bar bells.  Again he tends to want to move too quickly so PT provided HHA facing pt and had him begin with marching in place very slowly with emphasis on posture and improved L stance time x 20 reps>forward marching x 2 laps with PT  providing same support.      Sit<>stands from third step with feet planted on first step x 10 reps with PT on pts left to keep him shifting at midline and to prevent knee hyperextension as well as tactile cues for increased hip extension with less trunk extension.  PT also remained at pts L LE to guide forward motion of femur.  Note he did lose balance on first rep, having to step off bottom step but when cued for upright activation when buttocks leaves step, he did show improvement.  Forward step ups with LLE in stance, bringing RLE into march x 10 reps with single UE support on wall, lateral step ups x 10 reps each direction with UE support on wall.  Light facilitation from PT for upright posture and improved L weight shift.  Note some difficulty placing foot due to sensory issues, but seemed to improve with repetition.  For improved stepping strategy, worked on forward/lateral/posterior stepping x 4 sets on each side with min A esp for post stepping.  1/4 turns from midline to the left x 10 reps with min A fading to CGA and cues for leading with L LE when moving towards L and x 10 reps from midline to the R.    Pt reports slight pain in low back so performed hamstring stretch with noodle with back supported on wall x 30 secs each side then with lateral motion to increase stretch to groin x 30 secs each.  Pt tolerated well but note increased tightness in hamstrings and adductors on L>R.  While on step for step ups, had pt perform B calf stretch with heels off edge of step.  Once instance of posterior LOB otherwise did well x 2 sets of 30 secs.    Ended session with jogging across pool in deeper  section (slightly over 4') with min A and cues for improved L forward hip flex and pushing off toes to increase speed.  Performed 4 laps.     Pt requires buoyancy of water for support for reduced fall risk and for unloading/reduced stress on joints (L ankle and knee) as pt able to tolerate increased standing and  ambulation in water compared to that on land; viscosity of water is needed for resistance for strengthening and current of water provides perturbations for challenge for balance training             PATIENT EDUCATION: Education details: see above Person educated: Patient Education method: Chief Technology Officer Education comprehension: verbalized understanding and needs further education  HOME EXERCISE PROGRAM: To be provided  GOALS: Goals reviewed with patient? Yes  SHORT TERM GOALS: Target date: 09/17/2022  Patient will demonstrate 100% compliance with initial HEP to continue to progress between physical therapy sessions.   Baseline: To be provided Goal status: INITIAL  2. Patient will complete 1x sit to stand without UE use to demonstrate improved LE strength and decreased falls risk. Baseline: Requires bilateral use of UE Goal status: INITIAL  3.  Patient will improve gait speed to 0.77 m/s with LRAD to indicate improvement towards the level of community ambulator in order to participate more easily in activities outside of the home.   Baseline: 0.67 m/s without AD Goal status: INITIAL  4.  Patient will tolerate entire duration of with LRAD to indicated improved cardiorespiratory endurance for shopping outside of home.  Baseline: tolerated first 3 minutes (478 feet) Goal status: INITIAL  5.  Berg to be assessed/goal written as indicated Baseline: To be assessed Goal status: INITIAL  LONG TERM GOALS: Target date: 10/29/2022  Patient will report demonstrate independence with final HEP in order to maintain current gains and continue to progress after physical therapy discharge.   Baseline: To be provided Goal status: INITIAL  2.  Patient will complete 5xSTS without UE to indicate improved LE strength.  Baseline: 13.34" with bilateral UE support (SBA) Goal status: INITIAL  3.  Patient will improve gait speed to 0.87 m/s with LRAD to indicate improvement to  the level of community ambulator in order to participate more easily in activities outside of the home.   Baseline: 0.67 m/s without AD  Goal status: INITIAL  4.  Patient will improve with LRAD by 50 meters to achieve age reported norms for aerobic capacity and endurance in order to participate more easily in daily walking tasks.   Baseline: 145.6 meters without AD (CGA with minA x 1 due to LOB around curve) Goal status: INITIAL  5.  Patient will improve FOTO score to 74 to achieve predicted improvements in functional mobility due to skilled physical therapy interventions to increase safety with and participation in daily activities.  Baseline: 60 Goal status: INITIAL  6.  Berg to be assessed/goal written as indicated Baseline: To be assessed Goal status: INITIAL  ASSESSMENT:  CLINICAL IMPRESSION: Skilled session in pool today at [facility] in order to work on improved endurance, buoyancy of water to challenge balance with reduced fall risk, viscosity of water to strengthen and current to allow for external perturbations.  Pt able to demonstrate improved balance today needing less support from wall using barbells or PT HHA.  He also demonstrates improvement in LLE stance and activation with increased repetition and cuing.    OBJECTIVE IMPAIRMENTS: Abnormal gait, decreased activity tolerance, decreased balance, decreased endurance, difficulty walking,  decreased ROM, decreased strength, decreased safety awareness, impaired sensation, and impaired tone.   ACTIVITY LIMITATIONS: carrying, standing, squatting, and locomotion level  PARTICIPATION LIMITATIONS: cleaning, shopping, and community activity  PERSONAL FACTORS: Behavior pattern, Past/current experiences, and 3+ comorbidities: see above  are also affecting patient's functional outcome.   REHAB POTENTIAL: Fair Patient resistant to trial of AD during today's session, otherwise patient strongly motivated to defy  limitations  CLINICAL DECISION MAKING: Evolving/moderate complexity  EVALUATION COMPLEXITY: Moderate  PLAN:  PT FREQUENCY: 2x/week  PT DURATION: 8 weeks  PLANNED INTERVENTIONS: Therapeutic exercises, Therapeutic activity, Neuromuscular re-education, Balance training, Gait training, Patient/Family education, Self Care, Joint mobilization, DME instructions, Manual therapy, and Re-evaluation  PLAN FOR NEXT SESSION:  Pool:  L NMR, step ups, step strategy, coordination  Land: Sarah-Im adding him to my schedule for the next several weeks.  Take a look and see if this is ok.  He was ok with it.  Will you go through his schedule on Friday with him and take out a land appt of his choice.  Next week I'm full so he will have 2 land appts.  finish berg and update goal, trial AFO w/ and w/o anterior guard, cardiorespiratory endurance as able, initial HEP with NMR for L hemiparesis   Harriet Butte, PT, MPT [facility] [address] [place], [place], [address] Phone: [phone]   Fax:  [phone] 09/17/22, 1:43 PM   Check all possible CPT codes: [phone] - PT Re-evaluation, 97110- Therapeutic Exercise, [phone]- Neuro Re-education, [phone] - Gait Training, [phone] - Manual Therapy, [phone] - Therapeutic Activities, and [phone] - Self Care    Check all conditions that are expected to impact treatment: {Conditions expected to impact treatment:Respiratory disorders, Diabetes mellitus, and Neurological condition and/or seizures   If treatment provided at initial evaluation, no treatment charged due to lack of authorization.

## 2022-09-18 NOTE — Telephone Encounter (Signed)
Patient states that [facility] still has not received the updated order form. Please advise.

## 2022-09-19 ENCOUNTER — Telehealth: Payer: Self-pay | Admitting: Family Medicine

## 2022-09-19 NOTE — Telephone Encounter (Signed)
[facility] called and spoke to [facility], Pensions consultant about the refill(s) loperamide 2mg  requested. Advised it was discontinued on 01/03/21. He verbalized understanding.

## 2022-09-19 NOTE — Telephone Encounter (Signed)
Medication Refill - Medication: loperamide (IMODIUM A-D) 2 MG tablet   Josh from [facility] is calling to f/u on medication refills. If medication has been discontinued, please let the pharmacy know.  Has the patient contacted their pharmacy? Yes.    (Agent: If yes, when and what did the pharmacy advise?)  Preferred Pharmacy (with phone number or street name):  [facility] [address], [place] - [address]  [address] [place] [address]  Phone: [phone] Fax: [phone]  Hours: Not open 24 hours   Has the patient been seen for an appointment in the last year OR does the patient have an upcoming appointment? Yes.    Agent: Please be advised that RX refills may take up to 3 business days. We ask that you follow-up with your pharmacy.

## 2022-09-19 NOTE — Telephone Encounter (Signed)
[facility] was called and they are  re faxing the form.

## 2022-09-20 ENCOUNTER — Encounter: Payer: Self-pay | Admitting: Physical Therapy

## 2022-09-20 ENCOUNTER — Ambulatory Visit: Payer: Medicaid Other | Admitting: Physical Therapy

## 2022-09-20 VITALS — BP 122/73

## 2022-09-20 DIAGNOSIS — R293 Abnormal posture: Secondary | ICD-10-CM | POA: Diagnosis not present

## 2022-09-20 DIAGNOSIS — R269 Unspecified abnormalities of gait and mobility: Secondary | ICD-10-CM | POA: Diagnosis not present

## 2022-09-20 DIAGNOSIS — R2689 Other abnormalities of gait and mobility: Secondary | ICD-10-CM

## 2022-09-20 DIAGNOSIS — M6281 Muscle weakness (generalized): Secondary | ICD-10-CM | POA: Diagnosis not present

## 2022-09-20 DIAGNOSIS — R2681 Unsteadiness on feet: Secondary | ICD-10-CM | POA: Diagnosis not present

## 2022-09-20 NOTE — Therapy (Signed)
OUTPATIENT PHYSICAL THERAPY NEURO TREATMENT / SHORT-TERM GOAL ASSESSMENT   Patient Name: Craig West MRN: [id number] DOB:1965-06-28, 57 y.o., male Today's Date: 09/20/2022   PCP: Hoy Register, MD REFERRING PROVIDER: Hoy Register, MD  END OF SESSION:  PT End of Session - 09/20/22 0801     Visit Number 8    Number of Visits 17    Date for PT Re-Evaluation 10/29/22    Authorization Type [place] [employer]    Progress Note Due on Visit 10    PT Start Time 0800    PT Stop Time 0843    PT Time Calculation (min) 43 min    Equipment Utilized During Treatment Gait belt;Other (comment)    Activity Tolerance Patient tolerated treatment well    Behavior During Therapy [facility] for tasks assessed/performed              Past Medical History:  Diagnosis Date   Asthma    CHF (congestive heart failure) (HCC)    CKD (chronic kidney disease), stage IV (HCC)    COVID-19 virus infection 04/2019   Diabetes mellitus without complication (HCC)    Fatigue 11/23/2020   Gout    Hypertension    Leg heaviness 11/23/2020   NSVT (nonsustained ventricular tachycardia) (HCC) 11/23/2020   Rectal cancer [facility])    Past Surgical History:  Procedure Laterality Date   BIOPSY  01/11/2020   Procedure: BIOPSY;  Surgeon: Hilarie Fredrickson, MD;  Location: [facility] ENDOSCOPY;  Service: Endoscopy;;   COLONOSCOPY WITH PROPOFOL N/A 01/11/2020   Procedure: COLONOSCOPY WITH PROPOFOL;  Surgeon: Hilarie Fredrickson, MD;  Location: [facility] ENDOSCOPY;  Service: Endoscopy;  Laterality: N/A;   NO PAST SURGERIES     POLYPECTOMY  01/11/2020   Procedure: POLYPECTOMY;  Surgeon: Hilarie Fredrickson, MD;  Location: [facility] ENDOSCOPY;  Service: Endoscopy;;   SUBMUCOSAL TATTOO INJECTION  01/11/2020   Procedure: SUBMUCOSAL TATTOO INJECTION;  Surgeon: Hilarie Fredrickson, MD;  Location[facility] ENDOSCOPY;  Service: Endoscopy;;   Patient Active Problem List   Diagnosis Date Noted   Hemiparesis affecting left side as late effect of stroke (HCC) 08/05/2022   Stroke (HCC)  05/05/2022   Hemorrhagic stroke (HCC) 05/05/2022   Hypertensive emergency 05/05/2022   Nontraumatic intracerebral hemorrhage (HCC) 05/05/2022   BPH (benign prostatic hyperplasia) 03/27/2021   Fatigue 11/23/2020   Leg heaviness 11/23/2020   NSVT (nonsustained ventricular tachycardia) (HCC) 11/23/2020   Diabetes mellitus without complication (HCC)    CKD (chronic kidney disease), stage IV (HCC)    PICC (peripherally inserted central catheter) in place 03/27/2020   Rectal cancer (HCC) 01/27/2020   Benign neoplasm of ascending colon    Benign neoplasm of descending colon    Benign neoplasm of sigmoid colon    Rectal mass    Rectal bleeding    Iron deficiency anemia    Chronic diastolic heart failure (HCC)    Hyperkalemia    Volume overload 01/06/2020   Demand ischemia    COVID-19 virus infection 04/2019   Obesity (BMI 30.0-34.9) 12/09/2017   Non compliance w medication regimen 04/18/2017   Gout 08/02/2016   Diabetic neuropathy (HCC) 08/02/2016   Hyperlipidemia 10/24/2015   Asthma 10/16/2015   Type 2 diabetes mellitus without complication (HCC) 06/13/2014   Essential hypertension 06/13/2014   Smoker 06/13/2014    ONSET DATE: 08/05/2022 (referral)  REFERRING DIAG: W09.811 (ICD-10-CM) - Hemiparesis affecting left side as late effect of stroke (HCC)  THERAPY DIAG:  Unsteadiness on feet  Muscle weakness (generalized)  Other abnormalities  of gait and mobility  Abnormality of gait and mobility  Rationale for Evaluation and Treatment: Rehabilitation  SUBJECTIVE:                                                                                                                                                                                             SUBJECTIVE STATEMENT: Pt reports he liked the Bioness "okay" but does want to try with the thigh cuff next land visit.  Tolerated last pool session well.   Pt accompanied by: self  PERTINENT HISTORY: left-sided weakness and CT  scan revealed hemorrhagic stroke thought to be ischemic with conversion 05/05/2022, hypertension, type 2 diabetes mellitus, asthma, gout, stage IV chronic kidney disease, Hypertensive Heart Disease, Rectal carcinoma diagnosed in 12/2018 (completed chemotherapy and radiation), s/p Low anterior resection with diverting Colostomy at [facility] on 12/06/20 with subsequent reversal of colostomy PAIN:  Are you having pain? Yes: NPRS scale: 5/10 Pain location: L knee Pain description: achy Aggravating factors: walking, laying on my side without a pillow between my knees Relieving factors: rest  PRECAUTIONS: Fall  WEIGHT BEARING RESTRICTIONS: No  FALLS: Has patient fallen in last 6 months? No  LIVING ENVIRONMENT: Lives with:  lives with friends Lives in: House/apartment Stairs: No Has following equipment at home: Single point cane, Environmental consultant - 2 wheeled, shower chair, and Grab bars  PLOF: Independent - On Disability prior to stroke; Patient states he could run/move freely prior to the stroke  PATIENT GOALS: "I want to run."   OBJECTIVE:   DIAGNOSTIC FINDINGS:   05/07/2022 MR Brain wo Contrasts: IMPRESSION: 1. Right thalamic hematoma is stable since 05/05/2022, estimated blood volume of 3 mL. Regional edema but no significant intracranial mass effect. And no IVH, ventriculomegaly or other complicating features. 2. Negative motion degraded intracranial MRA. And evidence of chronic small vessel disease, with other chronic microhemorrhages in the left thalamus, left pons, and also the deep left cerebellum. Suspect some encephalomalacia in the cerebellum, and also some chronic Wallerian degeneration in the right brainstem. 3. No other acute intracranial abnormality.  05/06/2024 CT Head w/o Contrast: IMPRESSION: 1. No significant interval change in size and morphology of intraparenchymal hematoma centered at the right thalamus. Mild localized edema without significant regional mass effect. 2. No other  new acute intracranial abnormality.  05/05/2022 CT Head wo Contrast: IMPRESSION: Acute hypertensive type hemorrhage in the right thalamus measuring 2.6 mL.   COGNITION: Overall cognitive status: Within functional limits for tasks assessed   SENSATION: Numbness on L hemiparetic side  COORDINATION: Mild dysmetria noted on hemiparetic side on L  EDEMA:  WNL  MUSCLE TONE: LLE: Modifed  Ashworth Scale 1+ = Slight increase in muscle tone, manifested by a catch, followed by minimal resistance throughout the remainder (less than half) of the ROM  POSTURE: rounded shoulders and forward head  LOWER EXTREMITY ROM:       L hemiparesis results in decreased ROM on L side primarily   LOWER EXTREMITY MMT:    Held due to presence of tone on LLE but increased weakness noted on this side functionally with sit to stand and bias for RLE, grossly 4+/5 on RLE in hip flexor, knee extensors, knee flexors, dorsiflexors  TODAY'S TREATMENT:    Vitals:   09/20/22 0805  BP: 122/73   TherAct:  [facility] PT Assessment - 09/20/22 0001       6 minute walk test results    Aerobic Endurance Distance Walked 640   feet without AD (SBA - able to tolerate 4 min and 9 seconds in today's session)     Standardized Balance Assessment   Standardized Balance Assessment Five Times Sit to Stand;10 meter walk test    Five times sit to stand comments  19.78   sec without use of UE (SBA)   10 Meter Walk 0.88   m/s without AD (SBA)     Berg Balance Test   Sit to Stand Able to stand without using hands and stabilize independently    Standing Unsupported Able to stand safely 2 minutes    Sitting with Back Unsupported but Feet Supported on Floor or Stool Able to sit safely and securely 2 minutes    Stand to Sit Sits safely with minimal use of hands    Transfers Able to transfer safely, definite need of hands    Standing Unsupported with Eyes Closed Able to stand 10 seconds safely    Standing Unsupported with Feet Together  Able to place feet together independently and stand for 1 minute with supervision    From Standing, Reach Forward with Outstretched Arm Can reach forward >12 cm safely (5")    From Standing Position, Pick up Object from Floor Able to pick up shoe safely and easily    From Standing Position, Turn to Look Behind Over each Shoulder Looks behind one side only/other side shows less weight shift    Turn 360 Degrees Able to turn 360 degrees safely but slowly    Standing Unsupported, Alternately Place Feet on Step/Stool Able to complete 4 steps without aid or supervision    Standing Unsupported, One Foot in Front Needs help to step but can hold 15 seconds    Standing on One Leg Tries to lift leg/unable to hold 3 seconds but remains standing independently    Total Score 42              PATIENT EDUCATION: Education details: progress towards goals + bring  Person educated: Patient Education method: Chief Technology Officer Education comprehension: verbalized understanding and needs further education  HOME EXERCISE PROGRAM: Access Code: [email or web address] URL: [email or web address] Date: 09/10/2022 Prepared by: Maryruth Eve   Exercises - Single Leg Bridge  - 1 x daily - 7 x weekly - 3 sets - 12 reps - Active Straight Leg Raise with Quad Set  - 1 x daily - 7 x weekly - 3 sets - 10 reps - Seated March with Hip abduction/adduction  - 1 x daily - 7 x weekly - 3 sets - 10 reps  GOALS: Goals reviewed with patient? Yes  SHORT TERM GOALS: Target date: 09/17/2022  Patient will demonstrate 100% compliance with  initial HEP to continue to progress between physical therapy sessions.   Baseline: reports partial compliance Goal status: NOT met  2. Patient will complete 1x sit to stand without UE use to demonstrate improved LE strength and decreased falls risk. Baseline: Requires bilateral use of UE; 7x without UE use Goal status: MET  3.  Patient will improve gait speed to 0.77 m/s with LRAD to  indicate improvement towards the level of community ambulator in order to participate more easily in activities outside of the home.   Baseline: 0.67 m/s without AD; improved to 0.88 m/s Goal status: MET  4.  Patient will tolerate entire duration of with LRAD to indicated improved cardiorespiratory endurance for shopping outside of home.  Baseline: tolerated first 3 minutes (478 feet); able to tolerate 4 minutes without AD for 640 feet Goal status: NOT MET  5.  Patient will improve Berg Balance score by 9 points to indicate clinically significant progress towards a decreased risk of falls and improved static stability.  Baseline: 33/56 improved to 42/96 Goal status: MET  LONG TERM GOALS: Target date: 10/29/2022  Patient will report demonstrate independence with final HEP in order to maintain current gains and continue to progress after physical therapy discharge.   Baseline: To be provided Goal status: INITIAL  2.  Patient will complete 5xSTS without UE in < 18 seconds to indicate improved LE strength.  Baseline: 13.34" with bilateral UE support (SBA); 19.5 seconds without UE support Goal status: REVISED  3.  Patient will improve gait speed to 0.90 m/s with LRAD to indicate improvement to the level of community ambulator in order to participate more easily in activities outside of the home.   Baseline: 0.67 m/s without AD; improved to 0.88 m/s  Goal status: REVISED  4.  Patient will improve with LRAD by 50 meters to achieve age reported norms for aerobic capacity and endurance in order to participate more easily in daily walking tasks.   Baseline: 145.6 meters without AD (CGA with minA x 1 due to LOB around curve) Goal status: INITIAL  5.  Patient will improve FOTO score to 74 to achieve predicted improvements in functional mobility due to skilled physical therapy interventions to increase safety with and participation in daily activities.  Baseline: 60 Goal status:  INITIAL  6.  Patient will improve Berg Balance score to 45/56 or greater to indicate a decreased risk of falls and improved static stability.   Baseline: 33/56; improved to 42/56 Goal status: IN PROGRESS  ASSESSMENT:  CLINICAL IMPRESSION: Session focused on assessment on patient's progress towards goals. Patient reporting new onset of L knee pain in today's session so unable to tolerate full but increased tolerance to 4 minutes. Limited options to address pain as patient still resistant for external support to minimize gait deviations likely leading to pain. Patient willing to try bioness again in future session; unable to in today's session due to time constraints with goal check. Patient also demonstrates improved LE strength as indicated by ability to come to stand without UE use and reduction in risk for falls as indicated by gait speed / Berg results. Patient will continue to benefit from skilled physical therapy sessions to address deficits and maximize function. Continue POC.   OBJECTIVE IMPAIRMENTS: Abnormal gait, decreased activity tolerance, decreased balance, decreased endurance, difficulty walking, decreased ROM, decreased strength, decreased safety awareness, impaired sensation, and impaired tone.   ACTIVITY LIMITATIONS: carrying, standing, squatting, and locomotion level  PARTICIPATION LIMITATIONS: cleaning, shopping,  and community activity  PERSONAL FACTORS: Behavior pattern, Past/current experiences, and 3+ comorbidities: see above  are also affecting patient's functional outcome.   REHAB POTENTIAL: Fair Patient resistant to trial of AD during today's session, otherwise patient strongly motivated to defy limitations  CLINICAL DECISION MAKING: Evolving/moderate complexity  EVALUATION COMPLEXITY: Moderate  PLAN:  PT FREQUENCY: 2x/week  PT DURATION: 8 weeks  PLANNED INTERVENTIONS: Therapeutic exercises, Therapeutic activity, Neuromuscular re-education, Balance  training, Gait training, Patient/Family education, Self Care, Joint mobilization, DME instructions, Manual therapy, and Re-evaluation  PLAN FOR NEXT SESSION:  Pool:  L NMR, step ups, step strategy, coordination  Land: Bioness with quad cuff, gait training as tolerated, NMR  Maryruth Eve, PT, DPT 09/20/22, 11:31 AM   Check all possible CPT codes: 84696 - PT Re-evaluation, 97110- Therapeutic Exercise, [phone]- Neuro Re-education, [phone] - Gait Training, [phone] - Manual Therapy, [phone] - Therapeutic Activities, and [phone] - Self Care    Check all conditions that are expected to impact treatment: {Conditions expected to impact treatment:Respiratory disorders, Diabetes mellitus, and Neurological condition and/or seizures   If treatment provided at initial evaluation, no treatment charged due to lack of authorization.

## 2022-09-24 ENCOUNTER — Ambulatory Visit: Payer: Medicaid Other | Admitting: Rehabilitation

## 2022-09-24 ENCOUNTER — Ambulatory Visit: Payer: Medicaid Other | Admitting: Physical Therapy

## 2022-09-24 ENCOUNTER — Encounter: Payer: Self-pay | Admitting: Rehabilitation

## 2022-09-24 NOTE — Therapy (Deleted)
OUTPATIENT PHYSICAL THERAPY NEURO TREATMENT    Patient Name: Craig West MRN: [id number] DOB:[birth year], 57 y.o., male Today's Date: 09/24/2022   PCP: Hoy Register, MD REFERRING PROVIDER: Hoy Register, MD  END OF SESSION:  PT End of Session - 09/24/22 0835     Visit Number 9    Number of Visits 17    Date for PT Re-Evaluation 10/29/22    Authorization Type [place] Medicaid    Progress Note Due on Visit 10    Equipment Utilized During Treatment Gait belt;Other (comment)   floatation devices as needed for safety   Activity Tolerance Patient tolerated treatment well    Behavior During Therapy [facility] for tasks assessed/performed              Past Medical History:  Diagnosis Date   Asthma    CHF (congestive heart failure) (HCC)    CKD (chronic kidney disease), stage IV (HCC)    COVID-19 virus infection 04/2019   Diabetes mellitus without complication (HCC)    Fatigue 11/23/2020   Gout    Hypertension    Leg heaviness 11/23/2020   NSVT (nonsustained ventricular tachycardia) (HCC) 11/23/2020   Rectal cancer [facility])    Past Surgical History:  Procedure Laterality Date   BIOPSY  01/11/2020   Procedure: BIOPSY;  Surgeon: Hilarie Fredrickson, MD;  Location: [facility] ENDOSCOPY;  Service: Endoscopy;;   COLONOSCOPY WITH PROPOFOL N/A 01/11/2020   Procedure: COLONOSCOPY WITH PROPOFOL;  Surgeon: Hilarie Fredrickson, MD;  Location: [facility] ENDOSCOPY;  Service: Endoscopy;  Laterality: N/A;   NO PAST SURGERIES     POLYPECTOMY  01/11/2020   Procedure: POLYPECTOMY;  Surgeon: Hilarie Fredrickson, MD;  Location: [facility] ENDOSCOPY;  Service: Endoscopy;;   SUBMUCOSAL TATTOO INJECTION  01/11/2020   Procedure: SUBMUCOSAL TATTOO INJECTION;  Surgeon: Hilarie Fredrickson, MD;  Location: [facility] ENDOSCOPY;  Service: Endoscopy;;   Patient Active Problem List   Diagnosis Date Noted   Hemiparesis affecting left side as late effect of stroke (HCC) 08/05/2022   Stroke (HCC) 05/05/2022   Hemorrhagic stroke (HCC) 05/05/2022   Hypertensive  emergency 05/05/2022   Nontraumatic intracerebral hemorrhage (HCC) 05/05/2022   BPH (benign prostatic hyperplasia) 03/27/2021   Fatigue 11/23/2020   Leg heaviness 11/23/2020   NSVT (nonsustained ventricular tachycardia) (HCC) 11/23/2020   Diabetes mellitus without complication (HCC)    CKD (chronic kidney disease), stage IV (HCC)    PICC (peripherally inserted central catheter) in place 03/27/2020   Rectal cancer (HCC) 01/27/2020   Benign neoplasm of ascending colon    Benign neoplasm of descending colon    Benign neoplasm of sigmoid colon    Rectal mass    Rectal bleeding    Iron deficiency anemia    Chronic diastolic heart failure (HCC)    Hyperkalemia    Volume overload 01/06/2020   Demand ischemia    COVID-19 virus infection 04/2019   Obesity (BMI 30.0-34.9) 12/09/2017   Non compliance w medication regimen 04/18/2017   Gout 08/02/2016   Diabetic neuropathy (HCC) 08/02/2016   Hyperlipidemia 10/24/2015   Asthma 10/16/2015   Type 2 diabetes mellitus without complication (HCC) 06/13/2014   Essential hypertension 06/13/2014   Smoker 06/13/2014    ONSET DATE: 08/05/2022 (referral)  REFERRING DIAG: W09.811 (ICD-10-CM) - Hemiparesis affecting left side as late effect of stroke (HCC)  THERAPY DIAG:  Unsteadiness on feet  Muscle weakness (generalized)  Other abnormalities of gait and mobility  Abnormality of gait and mobility  Abnormal posture  Rationale for Evaluation and Treatment:  Rehabilitation  SUBJECTIVE:                                                                                                                                                                                             SUBJECTIVE STATEMENT:    Pt accompanied by: self  PERTINENT HISTORY: left-sided weakness and CT scan revealed hemorrhagic stroke thought to be ischemic with conversion 05/05/2022, hypertension, type 2 diabetes mellitus, asthma, gout, stage IV chronic kidney disease,  Hypertensive Heart Disease, Rectal carcinoma diagnosed in 12/2018 (completed chemotherapy and radiation), s/p Low anterior resection with diverting Colostomy at [facility] on 12/06/20 with subsequent reversal of colostomy PAIN:  Are you having pain? Yes: NPRS scale: 5/10 Pain location: L knee Pain description: achy Aggravating factors: walking, laying on my side without a pillow between my knees Relieving factors: rest  PRECAUTIONS: Fall  WEIGHT BEARING RESTRICTIONS: No  FALLS: Has patient fallen in last 6 months? No  LIVING ENVIRONMENT: Lives with:  lives with friends Lives in: House/apartment Stairs: No Has following equipment at home: Single point cane, Environmental consultant - 2 wheeled, shower chair, and Grab bars  PLOF: Independent - On Disability prior to stroke; Patient states he could run/move freely prior to the stroke  PATIENT GOALS: "I want to run."   OBJECTIVE:   DIAGNOSTIC FINDINGS:   05/07/2022 MR Brain wo Contrasts: IMPRESSION: 1. Right thalamic hematoma is stable since 05/05/2022, estimated blood volume of 3 mL. Regional edema but no significant intracranial mass effect. And no IVH, ventriculomegaly or other complicating features. 2. Negative motion degraded intracranial MRA. And evidence of chronic small vessel disease, with other chronic microhemorrhages in the left thalamus, left pons, and also the deep left cerebellum. Suspect some encephalomalacia in the cerebellum, and also some chronic Wallerian degeneration in the right brainstem. 3. No other acute intracranial abnormality.  05/06/2024 CT Head w/o Contrast: IMPRESSION: 1. No significant interval change in size and morphology of intraparenchymal hematoma centered at the right thalamus. Mild localized edema without significant regional mass effect. 2. No other new acute intracranial abnormality.  05/05/2022 CT Head wo Contrast: IMPRESSION: Acute hypertensive type hemorrhage in the right thalamus measuring 2.6 mL.    COGNITION: Overall cognitive status: Within functional limits for tasks assessed   SENSATION: Numbness on L hemiparetic side  COORDINATION: Mild dysmetria noted on hemiparetic side on L  EDEMA:  WNL  MUSCLE TONE: LLE: Modifed Ashworth Scale 1+ = Slight increase in muscle tone, manifested by a catch, followed by minimal resistance throughout the remainder (less than half) of the ROM  POSTURE: rounded shoulders and forward head  LOWER EXTREMITY ROM:  L hemiparesis results in decreased ROM on L side primarily   LOWER EXTREMITY MMT:    Held due to presence of tone on LLE but increased weakness noted on this side functionally with sit to stand and bias for RLE, grossly 4+/5 on RLE in hip flexor, knee extensors, knee flexors, dorsiflexors  TODAY'S TREATMENT:     Patient seen for aquatic therapy today.  Treatment took place in water 3.6-4.0 feet deep depending upon activity.  Pt entered and exited the pool via stairs using rails in step to pattern.  Pool temp at approx 92 deg.      Warm up:  Forward walking x approx 18' x 4 laps, backwards walking x 4 laps, both directions was able to ambulate without external support but PT did provide light facilitation for improved L lateral weight shift and light tapping for improved proximal hip control in L stance.     Performed side stepping today, holding small barballs, moving arms in abd when legs abd, performing squat, then adducting legs and arms at same time.  Needed intermittent min A to maintain balance esp with squat and needed continued cues for sequence throughout but did improve.  Attempted forward march, with pt holding small bar bells however he went quickly and needed more support so switched to larger bar bells.  Again he tends to want to move too quickly so PT provided HHA facing pt and had him begin with marching in place very slowly with emphasis on posture and improved L stance time x 20 reps>forward marching x 2 laps with PT  providing same support.         Sit<>stands from third step with feet planted on first step x 10 reps with PT on pts left to keep him shifting at midline and to prevent knee hyperextension as well as tactile cues for increased hip extension with less trunk extension.  PT also remained at pts L LE to guide forward motion of femur.  Note he did lose balance on first rep, having to step off bottom step but when cued for upright activation when buttocks leaves step, he did show improvement.  Forward step ups with LLE in stance, bringing RLE into march x 10 reps with single UE support on wall, lateral step ups x 10 reps each direction with UE support on wall.  Light facilitation from PT for upright posture and improved L weight shift.  Note some difficulty placing foot due to sensory issues, but seemed to improve with repetition.  For improved stepping strategy, worked on forward/lateral/posterior stepping x 4 sets on each side with min A esp for post stepping.  1/4 turns from midline to the left x 10 reps with min A fading to CGA and cues for leading with L LE when moving towards L and x 10 reps from midline to the R.     Pt reports slight pain in low back so performed hamstring stretch with noodle with back supported on wall x 30 secs each side then with lateral motion to increase stretch to groin x 30 secs each.  Pt tolerated well but note increased tightness in hamstrings and adductors on L>R.  While on step for step ups, had pt perform B calf stretch with heels off edge of step.  Once instance of posterior LOB otherwise did well x 2 sets of 30 secs.     Ended session with jogging across pool in deeper section (slightly over 4') with min A and cues for  improved L forward hip flex and pushing off toes to increase speed.  Performed 4 laps.       Pt requires buoyancy of water for support for reduced fall risk and for unloading/reduced stress on joints (L ankle and knee) as pt able to tolerate increased  standing and ambulation in water compared to that on land; viscosity of water is needed for resistance for strengthening and current of water provides perturbations for challenge for balance training        PATIENT EDUCATION: Education details: progress towards goals + bring  Person educated: Patient Education method: Chief Technology Officer Education comprehension: verbalized understanding and needs further education  HOME EXERCISE PROGRAM: Access Code: [email or web address] URL: [email or web address] Date: 09/10/2022 Prepared by: Maryruth Eve   Exercises - Single Leg Bridge  - 1 x daily - 7 x weekly - 3 sets - 12 reps - Active Straight Leg Raise with Quad Set  - 1 x daily - 7 x weekly - 3 sets - 10 reps - Seated March with Hip abduction/adduction  - 1 x daily - 7 x weekly - 3 sets - 10 reps  GOALS: Goals reviewed with patient? Yes  SHORT TERM GOALS: Target date: 09/17/2022  Patient will demonstrate 100% compliance with initial HEP to continue to progress between physical therapy sessions.   Baseline: reports partial compliance Goal status: NOT met  2. Patient will complete 1x sit to stand without UE use to demonstrate improved LE strength and decreased falls risk. Baseline: Requires bilateral use of UE; 7x without UE use Goal status: MET  3.  Patient will improve gait speed to 0.77 m/s with LRAD to indicate improvement towards the level of community ambulator in order to participate more easily in activities outside of the home.   Baseline: 0.67 m/s without AD; improved to 0.88 m/s Goal status: MET  4.  Patient will tolerate entire duration of with LRAD to indicated improved cardiorespiratory endurance for shopping outside of home.  Baseline: tolerated first 3 minutes (478 feet); able to tolerate 4 minutes without AD for 640 feet Goal status: NOT MET  5.  Patient will improve Berg Balance score by 9 points to indicate clinically significant progress towards a  decreased risk of falls and improved static stability.  Baseline: 33/56 improved to 42/96 Goal status: MET  LONG TERM GOALS: Target date: 10/29/2022  Patient will report demonstrate independence with final HEP in order to maintain current gains and continue to progress after physical therapy discharge.   Baseline: To be provided Goal status: INITIAL  2.  Patient will complete 5xSTS without UE in < 18 seconds to indicate improved LE strength.  Baseline: 13.34" with bilateral UE support (SBA); 19.5 seconds without UE support Goal status: REVISED  3.  Patient will improve gait speed to 0.90 m/s with LRAD to indicate improvement to the level of community ambulator in order to participate more easily in activities outside of the home.   Baseline: 0.67 m/s without AD; improved to 0.88 m/s  Goal status: REVISED  4.  Patient will improve with LRAD by 50 meters to achieve age reported norms for aerobic capacity and endurance in order to participate more easily in daily walking tasks.   Baseline: 145.6 meters without AD (CGA with minA x 1 due to LOB around curve) Goal status: INITIAL  5.  Patient will improve FOTO score to 74 to achieve predicted improvements in functional mobility due to skilled physical therapy interventions to increase safety with and  participation in daily activities.  Baseline: 60 Goal status: INITIAL  6.  Patient will improve Berg Balance score to 45/56 or greater to indicate a decreased risk of falls and improved static stability.   Baseline: 33/56; improved to 42/56 Goal status: IN PROGRESS  ASSESSMENT:  CLINICAL IMPRESSION:   OBJECTIVE IMPAIRMENTS: Abnormal gait, decreased activity tolerance, decreased balance, decreased endurance, difficulty walking, decreased ROM, decreased strength, decreased safety awareness, impaired sensation, and impaired tone.   ACTIVITY LIMITATIONS: carrying, standing, squatting, and locomotion level  PARTICIPATION LIMITATIONS:  cleaning, shopping, and community activity  PERSONAL FACTORS: Behavior pattern, Past/current experiences, and 3+ comorbidities: see above  are also affecting patient's functional outcome.   REHAB POTENTIAL: Fair Patient resistant to trial of AD during today's session, otherwise patient strongly motivated to defy limitations  CLINICAL DECISION MAKING: Evolving/moderate complexity  EVALUATION COMPLEXITY: Moderate  PLAN:  PT FREQUENCY: 2x/week  PT DURATION: 8 weeks  PLANNED INTERVENTIONS: Therapeutic exercises, Therapeutic activity, Neuromuscular re-education, Balance training, Gait training, Patient/Family education, Self Care, Joint mobilization, DME instructions, Manual therapy, and Re-evaluation  PLAN FOR NEXT SESSION:  Pool:  L NMR, step ups, step strategy, coordination  Land: Bioness with quad cuff, gait training as tolerated, NMR  Maryruth Eve, PT, DPT 09/24/22, 8:35 AM   Check all possible CPT codes: 21308 - PT Re-evaluation, 97110- Therapeutic Exercise, [phone]- Neuro Re-education, [phone] - Gait Training, [phone] - Manual Therapy, [phone] - Therapeutic Activities, and [phone] - Self Care    Check all conditions that are expected to impact treatment: {Conditions expected to impact treatment:Respiratory disorders, Diabetes mellitus, and Neurological condition and/or seizures   If treatment provided at initial evaluation, no treatment charged due to lack of authorization.

## 2022-09-27 ENCOUNTER — Ambulatory Visit: Payer: Medicaid Other | Admitting: Physical Therapy

## 2022-09-27 VITALS — BP 155/87 | HR 60

## 2022-09-27 DIAGNOSIS — R159 Full incontinence of feces: Secondary | ICD-10-CM | POA: Diagnosis not present

## 2022-09-27 DIAGNOSIS — R2681 Unsteadiness on feet: Secondary | ICD-10-CM

## 2022-09-27 DIAGNOSIS — R2689 Other abnormalities of gait and mobility: Secondary | ICD-10-CM

## 2022-09-27 DIAGNOSIS — I10 Essential (primary) hypertension: Secondary | ICD-10-CM | POA: Diagnosis not present

## 2022-09-27 DIAGNOSIS — R293 Abnormal posture: Secondary | ICD-10-CM | POA: Diagnosis not present

## 2022-09-27 DIAGNOSIS — G8220 Paraplegia, unspecified: Secondary | ICD-10-CM | POA: Diagnosis not present

## 2022-09-27 DIAGNOSIS — M6281 Muscle weakness (generalized): Secondary | ICD-10-CM | POA: Diagnosis not present

## 2022-09-27 DIAGNOSIS — R269 Unspecified abnormalities of gait and mobility: Secondary | ICD-10-CM | POA: Diagnosis not present

## 2022-09-27 NOTE — Therapy (Signed)
OUTPATIENT PHYSICAL THERAPY NEURO TREATMENT - 10TH VISIT PROGRESS NOTE   Patient Name: Craig West MRN: [id number] DOB:Apr 14, 1965, 57 y.o., male Today's Date: 09/27/2022   PCP: Hoy Register, MD REFERRING PROVIDER: Hoy Register, MD  Physical Therapy Progress Note   Dates of Reporting Period:08/20/22 - 09/27/22  See Note below for Objective Data and Assessment of Progress/Goals.    END OF SESSION:  PT End of Session - 09/27/22 0804     Visit Number 10    Number of Visits 17    Date for PT Re-Evaluation 10/29/22    Authorization Type [place] [employer]    Progress Note Due on Visit 10    PT Start Time 0803    PT Stop Time 0845    PT Time Calculation (min) 42 min    Equipment Utilized During Treatment Gait belt;Other (comment)   Bioness   Activity Tolerance Patient tolerated treatment well    Behavior During Therapy [facility] for tasks assessed/performed               Past Medical History:  Diagnosis Date   Asthma    CHF (congestive heart failure) (HCC)    CKD (chronic kidney disease), stage IV (HCC)    COVID-19 virus infection 04/2019   Diabetes mellitus without complication (HCC)    Fatigue 11/23/2020   Gout    Hypertension    Leg heaviness 11/23/2020   NSVT (nonsustained ventricular tachycardia) (HCC) 11/23/2020   Rectal cancer [facility])    Past Surgical History:  Procedure Laterality Date   BIOPSY  01/11/2020   Procedure: BIOPSY;  Surgeon: Hilarie Fredrickson, MD;  Location: [facility] ENDOSCOPY;  Service: Endoscopy;;   COLONOSCOPY WITH PROPOFOL N/A 01/11/2020   Procedure: COLONOSCOPY WITH PROPOFOL;  Surgeon: Hilarie Fredrickson, MD;  Location: [facility] ENDOSCOPY;  Service: Endoscopy;  Laterality: N/A;   NO PAST SURGERIES     POLYPECTOMY  01/11/2020   Procedure: POLYPECTOMY;  Surgeon: Hilarie Fredrickson, MD;  Location: [facility] ENDOSCOPY;  Service: Endoscopy;;   SUBMUCOSAL TATTOO INJECTION  01/11/2020   Procedure: SUBMUCOSAL TATTOO INJECTION;  Surgeon: Hilarie Fredrickson, MD;  Location: [facility] ENDOSCOPY;   Service: Endoscopy;;   Patient Active Problem List   Diagnosis Date Noted   Hemiparesis affecting left side as late effect of stroke (HCC) 08/05/2022   Stroke (HCC) 05/05/2022   Hemorrhagic stroke (HCC) 05/05/2022   Hypertensive emergency 05/05/2022   Nontraumatic intracerebral hemorrhage (HCC) 05/05/2022   BPH (benign prostatic hyperplasia) 03/27/2021   Fatigue 11/23/2020   Leg heaviness 11/23/2020   NSVT (nonsustained ventricular tachycardia) (HCC) 11/23/2020   Diabetes mellitus without complication (HCC)    CKD (chronic kidney disease), stage IV (HCC)    PICC (peripherally inserted central catheter) in place 03/27/2020   Rectal cancer (HCC) 01/27/2020   Benign neoplasm of ascending colon    Benign neoplasm of descending colon    Benign neoplasm of sigmoid colon    Rectal mass    Rectal bleeding    Iron deficiency anemia    Chronic diastolic heart failure (HCC)    Hyperkalemia    Volume overload 01/06/2020   Demand ischemia    COVID-19 virus infection 04/2019   Obesity (BMI 30.0-34.9) 12/09/2017   Non compliance w medication regimen 04/18/2017   Gout 08/02/2016   Diabetic neuropathy (HCC) 08/02/2016   Hyperlipidemia 10/24/2015   Asthma 10/16/2015   Type 2 diabetes mellitus without complication (HCC) 06/13/2014   Essential hypertension 06/13/2014   Smoker 06/13/2014    ONSET DATE: 08/05/2022 (referral)  REFERRING DIAG: J47.829 (ICD-10-CM) - Hemiparesis affecting left side as late effect of stroke (HCC)  THERAPY DIAG:  Unsteadiness on feet  Muscle weakness (generalized)  Other abnormalities of gait and mobility  Rationale for Evaluation and Treatment: Rehabilitation  SUBJECTIVE:                                                                                                                                                                                             SUBJECTIVE STATEMENT: Pt reports he did not go to the pool this week because he does not want to and  it messes up his work schedule. States he fell last night while getting out of bed. Denies injuries.   Pt accompanied by: self  PERTINENT HISTORY: left-sided weakness and CT scan revealed hemorrhagic stroke thought to be ischemic with conversion 05/05/2022, hypertension, type 2 diabetes mellitus, asthma, gout, stage IV chronic kidney disease, Hypertensive Heart Disease, Rectal carcinoma diagnosed in 12/2018 (completed chemotherapy and radiation), s/p Low anterior resection with diverting Colostomy at [facility] on 12/06/20 with subsequent reversal of colostomy PAIN:  Are you having pain? No  PRECAUTIONS: Fall  WEIGHT BEARING RESTRICTIONS: No  FALLS: Has patient fallen in last 6 months? No  LIVING ENVIRONMENT: Lives with:  lives with friends Lives in: House/apartment Stairs: No Has following equipment at home: Single point cane, Environmental consultant - 2 wheeled, shower chair, and Grab bars  PLOF: Independent - On Disability prior to stroke; Patient states he could run/move freely prior to the stroke  PATIENT GOALS: "I want to run."   OBJECTIVE:   DIAGNOSTIC FINDINGS:   05/07/2022 MR Brain wo Contrasts: IMPRESSION: 1. Right thalamic hematoma is stable since 05/05/2022, estimated blood volume of 3 mL. Regional edema but no significant intracranial mass effect. And no IVH, ventriculomegaly or other complicating features. 2. Negative motion degraded intracranial MRA. And evidence of chronic small vessel disease, with other chronic microhemorrhages in the left thalamus, left pons, and also the deep left cerebellum. Suspect some encephalomalacia in the cerebellum, and also some chronic Wallerian degeneration in the right brainstem. 3. No other acute intracranial abnormality.  05/06/2024 CT Head w/o Contrast: IMPRESSION: 1. No significant interval change in size and morphology of intraparenchymal hematoma centered at the right thalamus. Mild localized edema without significant regional mass effect. 2. No  other new acute intracranial abnormality.  05/05/2022 CT Head wo Contrast: IMPRESSION: Acute hypertensive type hemorrhage in the right thalamus measuring 2.6 mL.   COGNITION: Overall cognitive status: Within functional limits for tasks assessed   SENSATION: Numbness on L hemiparetic side  COORDINATION: Mild dysmetria noted on hemiparetic side on L  EDEMA:  WNL  MUSCLE TONE: LLE: Modifed Ashworth Scale 1+ = Slight increase in muscle tone, manifested by a catch, followed by minimal resistance throughout the remainder (less than half) of the ROM  POSTURE: rounded shoulders and forward head  LOWER EXTREMITY ROM:       L hemiparesis results in decreased ROM on L side primarily   LOWER EXTREMITY MMT:    Held due to presence of tone on LLE but increased weakness noted on this side functionally with sit to stand and bias for RLE, grossly 4+/5 on RLE in hip flexor, knee extensors, knee flexors, dorsiflexors  VITALS  Vitals:   09/27/22 0822 09/27/22 0841  BP: 139/89 (!) 155/87  Pulse: 63 60   TODAY'S TREATMENT:   There Act  Assessed vitals (see above) and pt's diastolic BP initially too elevated to participate in therapy. Waited several minutes and BP did lower within limits to participate in therapy   E-stim/attended  Bioness set up to L anterior tibialis and L quads using quick fit electrodes for improved knee stability and ankle DF w/gait training. See tablet 1 for details.     NMR/Gait Training  Gait pattern: step through pattern, decreased arm swing- Right, decreased arm swing- Left, decreased step length- Right, decreased stance time- Left, decreased stride length, decreased hip/knee flexion- Left, decreased ankle dorsiflexion- Left, Left foot flat, wide BOS, and poor foot clearance- Left Distance walked: >600' Assistive device utilized: None Level of assistance: SBA Comments: With Bioness on L quad and L anterior tibialis. Noted reduced circumduction and facilitation of  heel strike on LLE, but pt unable to maintain when fatigued. Cued pt to slow down and work on symmetric step length, as he has poor eccentric control on LLE. Pt required short seated rest break after 230' and reported feeling as though his leg was "heavy", but denied pain. Progressed to gait in // bars to use mirror for visual biofeedback on body position and had pt ambulate forward and retro w/intermittent UE support for improved step clearance of LLE and use of visual cues to facilitate heel strike. Pt performed well but was unable to clear LLE from ground w/fatigue in retro direction.    PATIENT EDUCATION: Education details: Plan to discontinue aquatic therapy, plan for next session, working on facilitation of heel strike w/LLE  Person educated: Patient Education method: Medical illustrator Education comprehension: verbalized understanding, returned demonstration, verbal cues required, and needs further education  HOME EXERCISE PROGRAM: Access Code: [email or web address] URL: [email or web address] Date: 09/10/2022 Prepared by: Maryruth Eve   Exercises - Single Leg Bridge  - 1 x daily - 7 x weekly - 3 sets - 12 reps - Active Straight Leg Raise with Quad Set  - 1 x daily - 7 x weekly - 3 sets - 10 reps - Seated March with Hip abduction/adduction  - 1 x daily - 7 x weekly - 3 sets - 10 reps  GOALS: Goals reviewed with patient? Yes  SHORT TERM GOALS: Target date: 09/17/2022  Patient will demonstrate 100% compliance with initial HEP to continue to progress between physical therapy sessions.   Baseline: reports partial compliance Goal status: NOT met  2. Patient will complete 1x sit to stand without UE use to demonstrate improved LE strength and decreased falls risk. Baseline: Requires bilateral use of UE; 7x without UE use Goal status: MET  3.  Patient will improve gait speed to 0.77 m/s with LRAD to indicate improvement towards the level of community ambulator in order  to  participate more easily in activities outside of the home.   Baseline: 0.67 m/s without AD; improved to 0.88 m/s Goal status: MET  4.  Patient will tolerate entire duration of with LRAD to indicated improved cardiorespiratory endurance for shopping outside of home.  Baseline: tolerated first 3 minutes (478 feet); able to tolerate 4 minutes without AD for 640 feet Goal status: NOT MET  5.  Patient will improve Berg Balance score by 9 points to indicate clinically significant progress towards a decreased risk of falls and improved static stability.  Baseline: 33/56 improved to 42/96 Goal status: MET  LONG TERM GOALS: Target date: 10/29/2022  Patient will report demonstrate independence with final HEP in order to maintain current gains and continue to progress after physical therapy discharge.   Baseline: To be provided Goal status: INITIAL  2.  Patient will complete 5xSTS without UE in < 18 seconds to indicate improved LE strength.  Baseline: 13.34" with bilateral UE support (SBA); 19.5 seconds without UE support Goal status: REVISED  3.  Patient will improve gait speed to 0.90 m/s with LRAD to indicate improvement to the level of community ambulator in order to participate more easily in activities outside of the home.   Baseline: 0.67 m/s without AD; improved to 0.88 m/s  Goal status: REVISED  4.  Patient will improve with LRAD by 50 meters to achieve age reported norms for aerobic capacity and endurance in order to participate more easily in daily walking tasks.   Baseline: 145.6 meters without AD (CGA with minA x 1 due to LOB around curve) Goal status: INITIAL  5.  Patient will improve FOTO score to 74 to achieve predicted improvements in functional mobility due to skilled physical therapy interventions to increase safety with and participation in daily activities.  Baseline: 60 Goal status: INITIAL  6.  Patient will improve Berg Balance score to 45/56 or greater to  indicate a decreased risk of falls and improved static stability.   Baseline: 33/56; improved to 42/56 Goal status: IN PROGRESS  ASSESSMENT:  CLINICAL IMPRESSION: Emphasis of skilled PT session on gait training w/Bioness for improved gait kinematics, endurance and BLE strength. Pt demonstrates good response to use of Bioness but requires mod verbal cues to initiate heel strike on LLE. Encouraged pt to practice this at home to reduce circumduction compensation and improved strength of L quad. Pt continues to progress towards his LTGs, improving his BLE strength but continues to be limited by poor endurance. Continue POC.   OBJECTIVE IMPAIRMENTS: Abnormal gait, decreased activity tolerance, decreased balance, decreased endurance, difficulty walking, decreased ROM, decreased strength, decreased safety awareness, impaired sensation, and impaired tone.   ACTIVITY LIMITATIONS: carrying, standing, squatting, and locomotion level  PARTICIPATION LIMITATIONS: cleaning, shopping, and community activity  PERSONAL FACTORS: Behavior pattern, Past/current experiences, and 3+ comorbidities: see above  are also affecting patient's functional outcome.   REHAB POTENTIAL: Fair Patient resistant to trial of AD during today's session, otherwise patient strongly motivated to defy limitations  CLINICAL DECISION MAKING: Evolving/moderate complexity  EVALUATION COMPLEXITY: Moderate  PLAN:  PT FREQUENCY: 2x/week  PT DURATION: 8 weeks  PLANNED INTERVENTIONS: Therapeutic exercises, Therapeutic activity, Neuromuscular re-education, Balance training, Gait training, Patient/Family education, Self Care, Joint mobilization, DME instructions, Manual therapy, and Re-evaluation  PLAN FOR NEXT SESSION:  Pt DC from aquatics. Bioness with quad cuff, gait training as tolerated, NMR, quad strength of LLE  Jill Alexanders Duke Weisensel, PT, DPT [facility] [address] [place],  [place]  [address] Phone:   [phone] Fax:  [phone] 09/27/22, 9:05 AM   Check all possible CPT codes: 97164 - PT Re-evaluation, 97110- Therapeutic Exercise, [phone]- Neuro Re-education, [phone] - Gait Training, [phone] - Manual Therapy, [phone] - Therapeutic Activities, and [phone] - Self Care    Check all conditions that are expected to impact treatment: {Conditions expected to impact treatment:Respiratory disorders, Diabetes mellitus, and Neurological condition and/or seizures   If treatment provided at initial evaluation, no treatment charged due to lack of authorization.

## 2022-10-01 ENCOUNTER — Ambulatory Visit: Payer: Medicaid Other | Admitting: Rehabilitation

## 2022-10-01 ENCOUNTER — Encounter: Payer: Self-pay | Admitting: Physical Therapy

## 2022-10-01 ENCOUNTER — Ambulatory Visit: Payer: Medicaid Other | Attending: Family Medicine | Admitting: Physical Therapy

## 2022-10-01 VITALS — BP 131/82 | HR 60

## 2022-10-01 DIAGNOSIS — M6281 Muscle weakness (generalized): Secondary | ICD-10-CM | POA: Diagnosis not present

## 2022-10-01 DIAGNOSIS — R269 Unspecified abnormalities of gait and mobility: Secondary | ICD-10-CM | POA: Diagnosis not present

## 2022-10-01 DIAGNOSIS — R2681 Unsteadiness on feet: Secondary | ICD-10-CM | POA: Diagnosis not present

## 2022-10-01 DIAGNOSIS — R2689 Other abnormalities of gait and mobility: Secondary | ICD-10-CM | POA: Diagnosis not present

## 2022-10-01 NOTE — Therapy (Signed)
OUTPATIENT PHYSICAL THERAPY NEURO TREATMENT   Patient Name: Craig West MRN: [id number] DOB:1966/03/30, 57 y.o., male Today's Date: 10/01/2022   PCP: Hoy Register, MD REFERRING PROVIDER: Hoy Register, MD  Physical Therapy Progress Note   Dates of Reporting Period:08/20/22 - 09/27/22  See Note below for Objective Data and Assessment of Progress/Goals.    END OF SESSION:  PT End of Session - 10/01/22 0806     Visit Number 11    Number of Visits 17    Date for PT Re-Evaluation 10/29/22    Authorization Type [employer]    Progress Note Due on Visit 10    PT Start Time 0805    PT Stop Time 0845    PT Time Calculation (min) 40 min    Equipment Utilized During Treatment Gait belt;Other (comment)   bioness   Activity Tolerance Patient tolerated treatment well    Behavior During Therapy [facility] for tasks assessed/performed             Past Medical History:  Diagnosis Date   Asthma    CHF (congestive heart failure) (HCC)    CKD (chronic kidney disease), stage IV (HCC)    COVID-19 virus infection 04/2019   Diabetes mellitus without complication (HCC)    Fatigue 11/23/2020   Gout    Hypertension    Leg heaviness 11/23/2020   NSVT (nonsustained ventricular tachycardia) (HCC) 11/23/2020   Rectal cancer [facility])    Past Surgical History:  Procedure Laterality Date   BIOPSY  01/11/2020   Procedure: BIOPSY;  Surgeon: Hilarie Fredrickson, MD;  Location: [facility] ENDOSCOPY;  Service: Endoscopy;;   COLONOSCOPY WITH PROPOFOL N/A 01/11/2020   Procedure: COLONOSCOPY WITH PROPOFOL;  Surgeon: Hilarie Fredrickson, MD;  Location: [facility] ENDOSCOPY;  Service: Endoscopy;  Laterality: N/A;   NO PAST SURGERIES     POLYPECTOMY  01/11/2020   Procedure: POLYPECTOMY;  Surgeon: Hilarie Fredrickson, MD;  Location: [facility] ENDOSCOPY;  Service: Endoscopy;;   SUBMUCOSAL TATTOO INJECTION  01/11/2020   Procedure: SUBMUCOSAL TATTOO INJECTION;  Surgeon: Hilarie Fredrickson, MD;  Location: [facility] ENDOSCOPY;  Service: Endoscopy;;   Patient  Active Problem List   Diagnosis Date Noted   Hemiparesis affecting left side as late effect of stroke (HCC) 08/05/2022   Stroke (HCC) 05/05/2022   Hemorrhagic stroke (HCC) 05/05/2022   Hypertensive emergency 05/05/2022   Nontraumatic intracerebral hemorrhage (HCC) 05/05/2022   BPH (benign prostatic hyperplasia) 03/27/2021   Fatigue 11/23/2020   Leg heaviness 11/23/2020   NSVT (nonsustained ventricular tachycardia) (HCC) 11/23/2020   Diabetes mellitus without complication (HCC)    CKD (chronic kidney disease), stage IV (HCC)    PICC (peripherally inserted central catheter) in place 03/27/2020   Rectal cancer (HCC) 01/27/2020   Benign neoplasm of ascending colon    Benign neoplasm of descending colon    Benign neoplasm of sigmoid colon    Rectal mass    Rectal bleeding    Iron deficiency anemia    Chronic diastolic heart failure (HCC)    Hyperkalemia    Volume overload 01/06/2020   Demand ischemia    COVID-19 virus infection 04/2019   Obesity (BMI 30.0-34.9) 12/09/2017   Non compliance w medication regimen 04/18/2017   Gout 08/02/2016   Diabetic neuropathy (HCC) 08/02/2016   Hyperlipidemia 10/24/2015   Asthma 10/16/2015   Type 2 diabetes mellitus without complication (HCC) 06/13/2014   Essential hypertension 06/13/2014   Smoker 06/13/2014    ONSET DATE: 08/05/2022 (referral)  REFERRING DIAG: W09.811 (ICD-10-CM) - Hemiparesis  affecting left side as late effect of stroke (HCC)  THERAPY DIAG:  Unsteadiness on feet  Muscle weakness (generalized)  Other abnormalities of gait and mobility  Abnormality of gait and mobility  Rationale for Evaluation and Treatment: Rehabilitation  SUBJECTIVE:                                                                                                                                                                                             SUBJECTIVE STATEMENT: Pt reports that he is having more L knee lateral knee pain. The patient  also reports that he feels like bioness helps him walk better all days. Denies falls/near falls.  Pt accompanied by: self  PERTINENT HISTORY: left-sided weakness and CT scan revealed hemorrhagic stroke thought to be ischemic with conversion 05/05/2022, hypertension, type 2 diabetes mellitus, asthma, gout, stage IV chronic kidney disease, Hypertensive Heart Disease, Rectal carcinoma diagnosed in 12/2018 (completed chemotherapy and radiation), s/p Low anterior resection with diverting Colostomy at [facility] on 12/06/20 with subsequent reversal of colostomy PAIN:  Are you having pain? Yes: NPRS scale: 6/10 Pain location: L lateral knee Pain description: achy, sore Aggravating factors: putting pressure on it Relieving factors: nothing  PRECAUTIONS: Fall  WEIGHT BEARING RESTRICTIONS: No  FALLS: Has patient fallen in last 6 months? No  LIVING ENVIRONMENT: Lives with:  lives with friends Lives in: House/apartment Stairs: No Has following equipment at home: Single point cane, Environmental consultant - 2 wheeled, shower chair, and Grab bars  PLOF: Independent - On Disability prior to stroke; Patient states he could run/move freely prior to the stroke  PATIENT GOALS: "I want to run."   OBJECTIVE:   DIAGNOSTIC FINDINGS:   05/07/2022 MR Brain wo Contrasts: IMPRESSION: 1. Right thalamic hematoma is stable since 05/05/2022, estimated blood volume of 3 mL. Regional edema but no significant intracranial mass effect. And no IVH, ventriculomegaly or other complicating features. 2. Negative motion degraded intracranial MRA. And evidence of chronic small vessel disease, with other chronic microhemorrhages in the left thalamus, left pons, and also the deep left cerebellum. Suspect some encephalomalacia in the cerebellum, and also some chronic Wallerian degeneration in the right brainstem. 3. No other acute intracranial abnormality.  05/06/2024 CT Head w/o Contrast: IMPRESSION: 1. No significant interval change in size  and morphology of intraparenchymal hematoma centered at the right thalamus. Mild localized edema without significant regional mass effect. 2. No other new acute intracranial abnormality.  05/05/2022 CT Head wo Contrast: IMPRESSION: Acute hypertensive type hemorrhage in the right thalamus measuring 2.6 mL.   COGNITION: Overall cognitive status: Within functional limits for tasks assessed   SENSATION: Numbness on  L hemiparetic side  COORDINATION: Mild dysmetria noted on hemiparetic side on L  EDEMA:  WNL  MUSCLE TONE: LLE: Modifed Ashworth Scale 1+ = Slight increase in muscle tone, manifested by a catch, followed by minimal resistance throughout the remainder (less than half) of the ROM  POSTURE: rounded shoulders and forward head  LOWER EXTREMITY ROM:       L hemiparesis results in decreased ROM on L side primarily   LOWER EXTREMITY MMT:    Held due to presence of tone on LLE but increased weakness noted on this side functionally with sit to stand and bias for RLE, grossly 4+/5 on RLE in hip flexor, knee extensors, knee flexors, dorsiflexors  VITALS  Vitals:   10/01/22 0811  BP: 131/82  Pulse: 60    TODAY'S TREATMENT:    E-stim/attended  Bioness set up to L anterior tibialis and L quads using quick fit electrodes for improved knee extension/stability and ankle DF w/gait training. See tablet 1 for details. Donned through duration of session for activities listed below.  NMR/Gait Training  Gait pattern: step through pattern, decreased arm swing- Right, decreased arm swing- Left, decreased step length- Right, decreased stance time- Left, decreased stride length, decreased hip/knee flexion- Left, decreased ankle dorsiflexion- Left, Left foot flat, wide BOS, and poor foot clearance- Left Distance walked: 1 x 345', 1 x 230'  Assistive device utilized: None Level of assistance: SBA (CGA 2x due to lateral LOB but patient able to self correct) Comments: With Bioness on L quad  and L anterior tibialis. Improved heel strike and reduced circumduction, had to reset 3x during session due to lower cuff shifting anterior and reducing ankle activation; improved gait mechanics noted when doffed and leaving compared to when patient am  6 yard stick step overs working on swing through and heel strike mix of leading with RLE for foot clearance and LLE for RLE stance 4 x 10' (CGA)  PATIENT EDUCATION: Education details: Continue HEP  Person educated: Patient Education method: Medical illustrator Education comprehension: verbalized understanding, returned demonstration, verbal cues required, and needs further education  HOME EXERCISE PROGRAM: Access Code: [email or web address] URL: [email or web address] Date: 09/10/2022 Prepared by: Maryruth Eve   Exercises - Single Leg Bridge  - 1 x daily - 7 x weekly - 3 sets - 12 reps - Active Straight Leg Raise with Quad Set  - 1 x daily - 7 x weekly - 3 sets - 10 reps - Seated March with Hip abduction/adduction  - 1 x daily - 7 x weekly - 3 sets - 10 reps  GOALS: Goals reviewed with patient? Yes  SHORT TERM GOALS: Target date: 09/17/2022  Patient will demonstrate 100% compliance with initial HEP to continue to progress between physical therapy sessions.   Baseline: reports partial compliance Goal status: NOT met  2. Patient will complete 1x sit to stand without UE use to demonstrate improved LE strength and decreased falls risk. Baseline: Requires bilateral use of UE; 7x without UE use Goal status: MET  3.  Patient will improve gait speed to 0.77 m/s with LRAD to indicate improvement towards the level of community ambulator in order to participate more easily in activities outside of the home.   Baseline: 0.67 m/s without AD; improved to 0.88 m/s Goal status: MET  4.  Patient will tolerate entire duration of with LRAD to indicated improved cardiorespiratory endurance for shopping outside of home.  Baseline:  tolerated first 3 minutes (478 feet); able to tolerate 4 minutes  without AD for 640 feet Goal status: NOT MET  5.  Patient will improve Berg Balance score by 9 points to indicate clinically significant progress towards a decreased risk of falls and improved static stability.  Baseline: 33/56 improved to 42/96 Goal status: MET  LONG TERM GOALS: Target date: 10/29/2022  Patient will report demonstrate independence with final HEP in order to maintain current gains and continue to progress after physical therapy discharge.   Baseline: To be provided Goal status: INITIAL  2.  Patient will complete 5xSTS without UE in < 18 seconds to indicate improved LE strength.  Baseline: 13.34" with bilateral UE support (SBA); 19.5 seconds without UE support Goal status: REVISED  3.  Patient will improve gait speed to 0.90 m/s with LRAD to indicate improvement to the level of community ambulator in order to participate more easily in activities outside of the home.   Baseline: 0.67 m/s without AD; improved to 0.88 m/s  Goal status: REVISED  4.  Patient will improve with LRAD by 50 meters to achieve age reported norms for aerobic capacity and endurance in order to participate more easily in daily walking tasks.   Baseline: 145.6 meters without AD (CGA with minA x 1 due to LOB around curve) Goal status: INITIAL  5.  Patient will improve FOTO score to 74 to achieve predicted improvements in functional mobility due to skilled physical therapy interventions to increase safety with and participation in daily activities.  Baseline: 60 Goal status: INITIAL  6.  Patient will improve Berg Balance score to 45/56 or greater to indicate a decreased risk of falls and improved static stability.   Baseline: 33/56; improved to 42/56 Goal status: IN PROGRESS  ASSESSMENT:  CLINICAL IMPRESSION: Emphasis of skilled PT session on continued gait training w/Bioness for improved sequencing and muscle activation during  gait. Pt continues to demonstrate good carry over in improved foot clearance and reduced circumduction when walking out of session then walking in. Final part of session spent on RLE leg clearance and stability during step over tasks. Patient reported mild reduction in pain with use of bioness as it helped facilitate increased stability during gait. Continue POC.   OBJECTIVE IMPAIRMENTS: Abnormal gait, decreased activity tolerance, decreased balance, decreased endurance, difficulty walking, decreased ROM, decreased strength, decreased safety awareness, impaired sensation, and impaired tone.   ACTIVITY LIMITATIONS: carrying, standing, squatting, and locomotion level  PARTICIPATION LIMITATIONS: cleaning, shopping, and community activity  PERSONAL FACTORS: Behavior pattern, Past/current experiences, and 3+ comorbidities: see above  are also affecting patient's functional outcome.   REHAB POTENTIAL: Fair Patient resistant to trial of AD during today's session, otherwise patient strongly motivated to defy limitations  CLINICAL DECISION MAKING: Evolving/moderate complexity  EVALUATION COMPLEXITY: Moderate  PLAN:  PT FREQUENCY: 2x/week  PT DURATION: 8 weeks  PLANNED INTERVENTIONS: Therapeutic exercises, Therapeutic activity, Neuromuscular re-education, Balance training, Gait training, Patient/Family education, Self Care, Joint mobilization, DME instructions, Manual therapy, and Re-evaluation  PLAN FOR NEXT SESSION:  Bioness with quad cuff, gait training as tolerated, NMR, quad strength of LLE, step over/stance tasks   Maryruth Eve, PT, DPT [facility] [address] [place], [place]  [address] Phone:  [phone] Fax:  [phone] 10/01/22, 12:55 PM   Check all possible CPT codes: 97164 - PT Re-evaluation, 97110- Therapeutic Exercise, [phone]- Neuro Re-education, [phone] - Gait Training, 97140 - Manual Therapy, 97530 - Therapeutic Activities, and 97535 - Self  Care    Check all conditions that are expected to impact treatment: {Conditions  expected to impact treatment:Respiratory disorders, Diabetes mellitus, and Neurological condition and/or seizures   If treatment provided at initial evaluation, no treatment charged due to lack of authorization.

## 2022-10-02 ENCOUNTER — Ambulatory Visit: Payer: Medicaid Other | Admitting: Family Medicine

## 2022-10-04 ENCOUNTER — Encounter: Payer: Self-pay | Admitting: Physical Therapy

## 2022-10-04 ENCOUNTER — Ambulatory Visit: Payer: Medicaid Other | Admitting: Physical Therapy

## 2022-10-04 VITALS — BP 139/91 | HR 64

## 2022-10-04 DIAGNOSIS — M6281 Muscle weakness (generalized): Secondary | ICD-10-CM | POA: Diagnosis not present

## 2022-10-04 DIAGNOSIS — R269 Unspecified abnormalities of gait and mobility: Secondary | ICD-10-CM

## 2022-10-04 DIAGNOSIS — R2689 Other abnormalities of gait and mobility: Secondary | ICD-10-CM

## 2022-10-04 DIAGNOSIS — R2681 Unsteadiness on feet: Secondary | ICD-10-CM | POA: Diagnosis not present

## 2022-10-04 NOTE — Therapy (Signed)
OUTPATIENT PHYSICAL THERAPY NEURO TREATMENT   Patient Name: Craig West MRN: [id number] DOB:1965-07-13, 57 y.o., male Today's Date: 10/04/2022   PCP: Hoy Register, MD REFERRING PROVIDER: Hoy Register, MD  Physical Therapy Progress Note   Dates of Reporting Period:08/20/22 - 09/27/22  See Note below for Objective Data and Assessment of Progress/Goals.    END OF SESSION:  PT End of Session - 10/04/22 0808     Visit Number 12    Number of Visits 17    Date for PT Re-Evaluation 10/29/22    Authorization Type [place] [employer]    PT Start Time 0803    PT Stop Time 0845    PT Time Calculation (min) 42 min    Equipment Utilized During Treatment Gait belt;Other (comment)    Activity Tolerance Patient tolerated treatment well    Behavior During Therapy [facility] for tasks assessed/performed             Past Medical History:  Diagnosis Date   Asthma    CHF (congestive heart failure) (HCC)    CKD (chronic kidney disease), stage IV (HCC)    COVID-19 virus infection 04/2019   Diabetes mellitus without complication (HCC)    Fatigue 11/23/2020   Gout    Hypertension    Leg heaviness 11/23/2020   NSVT (nonsustained ventricular tachycardia) (HCC) 11/23/2020   Rectal cancer [facility])    Past Surgical History:  Procedure Laterality Date   BIOPSY  01/11/2020   Procedure: BIOPSY;  Surgeon: Hilarie Fredrickson, MD;  Location: [facility] ENDOSCOPY;  Service: Endoscopy;;   COLONOSCOPY WITH PROPOFOL N/A 01/11/2020   Procedure: COLONOSCOPY WITH PROPOFOL;  Surgeon: Hilarie Fredrickson, MD;  Location: [facility] ENDOSCOPY;  Service: Endoscopy;  Laterality: N/A;   NO PAST SURGERIES     POLYPECTOMY  01/11/2020   Procedure: POLYPECTOMY;  Surgeon: Hilarie Fredrickson, MD;  Location: [facility] ENDOSCOPY;  Service: Endoscopy;;   SUBMUCOSAL TATTOO INJECTION  01/11/2020   Procedure: SUBMUCOSAL TATTOO INJECTION;  Surgeon: Hilarie Fredrickson, MD;  Location: [facility] ENDOSCOPY;  Service: Endoscopy;;   Patient Active Problem List   Diagnosis Date Noted    Hemiparesis affecting left side as late effect of stroke (HCC) 08/05/2022   Stroke (HCC) 05/05/2022   Hemorrhagic stroke (HCC) 05/05/2022   Hypertensive emergency 05/05/2022   Nontraumatic intracerebral hemorrhage (HCC) 05/05/2022   BPH (benign prostatic hyperplasia) 03/27/2021   Fatigue 11/23/2020   Leg heaviness 11/23/2020   NSVT (nonsustained ventricular tachycardia) (HCC) 11/23/2020   Diabetes mellitus without complication (HCC)    CKD (chronic kidney disease), stage IV (HCC)    PICC (peripherally inserted central catheter) in place 03/27/2020   Rectal cancer (HCC) 01/27/2020   Benign neoplasm of ascending colon    Benign neoplasm of descending colon    Benign neoplasm of sigmoid colon    Rectal mass    Rectal bleeding    Iron deficiency anemia    Chronic diastolic heart failure (HCC)    Hyperkalemia    Volume overload 01/06/2020   Demand ischemia    COVID-19 virus infection 04/2019   Obesity (BMI 30.0-34.9) 12/09/2017   Non compliance w medication regimen 04/18/2017   Gout 08/02/2016   Diabetic neuropathy (HCC) 08/02/2016   Hyperlipidemia 10/24/2015   Asthma 10/16/2015   Type 2 diabetes mellitus without complication (HCC) 06/13/2014   Essential hypertension 06/13/2014   Smoker 06/13/2014    ONSET DATE: 08/05/2022 (referral)  REFERRING DIAG: W09.811 (ICD-10-CM) - Hemiparesis affecting left side as late effect of stroke (HCC)  THERAPY  DIAG:  Unsteadiness on feet  Muscle weakness (generalized)  Other abnormalities of gait and mobility  Abnormality of gait and mobility  Rationale for Evaluation and Treatment: Rehabilitation  SUBJECTIVE:                                                                                                                                                                                             SUBJECTIVE STATEMENT: Pt reports that he is having more L knee lateral knee pain. The patient also reports that he feels like bioness helps him  walk better all days. Denies falls/near falls.  Pt accompanied by: self  PERTINENT HISTORY: left-sided weakness and CT scan revealed hemorrhagic stroke thought to be ischemic with conversion 05/05/2022, hypertension, type 2 diabetes mellitus, asthma, gout, stage IV chronic kidney disease, Hypertensive Heart Disease, Rectal carcinoma diagnosed in 12/2018 (completed chemotherapy and radiation), s/p Low anterior resection with diverting Colostomy at [facility] on 12/06/20 with subsequent reversal of colostomy PAIN:  Are you having pain? Yes: NPRS scale: 7/10 Pain location: L lateral knee Pain description: achy, sore Aggravating factors: putting pressure on it Relieving factors: nothing  PRECAUTIONS: Fall  WEIGHT BEARING RESTRICTIONS: No  FALLS: Has patient fallen in last 6 months? No  LIVING ENVIRONMENT: Lives with:  lives with friends Lives in: House/apartment Stairs: No Has following equipment at home: Single point cane, Environmental consultant - 2 wheeled, shower chair, and Grab bars  PLOF: Independent - On Disability prior to stroke; Patient states he could run/move freely prior to the stroke  PATIENT GOALS: "I want to run."   OBJECTIVE:   DIAGNOSTIC FINDINGS:   05/07/2022 MR Brain wo Contrasts: IMPRESSION: 1. Right thalamic hematoma is stable since 05/05/2022, estimated blood volume of 3 mL. Regional edema but no significant intracranial mass effect. And no IVH, ventriculomegaly or other complicating features. 2. Negative motion degraded intracranial MRA. And evidence of chronic small vessel disease, with other chronic microhemorrhages in the left thalamus, left pons, and also the deep left cerebellum. Suspect some encephalomalacia in the cerebellum, and also some chronic Wallerian degeneration in the right brainstem. 3. No other acute intracranial abnormality.  05/06/2024 CT Head w/o Contrast: IMPRESSION: 1. No significant interval change in size and morphology of intraparenchymal hematoma  centered at the right thalamus. Mild localized edema without significant regional mass effect. 2. No other new acute intracranial abnormality.  05/05/2022 CT Head wo Contrast: IMPRESSION: Acute hypertensive type hemorrhage in the right thalamus measuring 2.6 mL.   COGNITION: Overall cognitive status: Within functional limits for tasks assessed   SENSATION: Numbness on L hemiparetic side  COORDINATION: Mild dysmetria noted on hemiparetic side  on L  EDEMA:  WNL  MUSCLE TONE: LLE: Modifed Ashworth Scale 1+ = Slight increase in muscle tone, manifested by a catch, followed by minimal resistance throughout the remainder (less than half) of the ROM  POSTURE: rounded shoulders and forward head  LOWER EXTREMITY ROM:       L hemiparesis results in decreased ROM on L side primarily   LOWER EXTREMITY MMT:    Held due to presence of tone on LLE but increased weakness noted on this side functionally with sit to stand and bias for RLE, grossly 4+/5 on RLE in hip flexor, knee extensors, knee flexors, dorsiflexors  VITALS  Vitals:   10/04/22 0811 10/04/22 0817  BP: (!) 144/103 (!) 139/91  Pulse: 67 64   BP initially out of safe therapeutic range. Had patient rest for a few minutes and then retested. Encouraged patient to continue to closely monitor and may require calling PCP to adjust medications if continues to notice rising trends. Patient did take BP medication this morning.   TODAY'S TREATMENT:    Gait (AFO Trial):  TherEx: - Longsit hamstring stretch with gentle DF from therapist to address LLE pain; educated on performing regularly at home 3 x 30"  - Therapist noted mild swelling on medial aspect of L knee, unclear if due to injury or pressure of legs rubbing together in sleep in sidelying (will continue to monitor)  Gait without bracing  GAIT: Gait pattern: decreased step length- Left, decreased stance time- Left, decreased hip/knee flexion- Left, decreased ankle  dorsiflexion- Left, wide BOS, and poor foot clearance- Left - poor eccentric control of L plantarflexors, Antalgic Distance walked: 2 x 100 feet (walking into/out of clinic) Assistive device utilized: None Level of assistance: SBA Comments: Increased pain and favoring of L foot noted, poor eccentric control of foot  Ottobox Medial Strut Posterior Guard  GAIT: Gait pattern: decreased step length- Left, decreased stance time- Left, decreased hip/knee flexion- Left, decreased ankle dorsiflexion- Left, wide BOS, and poor foot clearance- Left - poor eccentric control of L plantarflexors, Antalgic Distance walked: 1 x 20 feet  Assistive device utilized: Ottobox Medial Strut Posterior Guard Level of assistance: SBA Comments: Unable to tolerate more than a few feet, reported pain due to pressure of medial strut on medial aspect of foot, did not improve foot/gait mechanics  Townsend Lateral Strut Posterior Guard  GAIT: Gait pattern: decreased step length- Left, decreased stance time- Left, decreased hip/knee flexion- Left, decreased ankle dorsiflexion- Left, wide BOS, and poor foot clearance- Left - poor eccentric control of L plantarflexors, Antalgic Distance walked: 1 X 115 feet Assistive device utilized: Townsend Lateral Strut Posterior Guard Level of assistance: SBA Comments: Limited improvement in gait pattern, continues to demonstrate notable antalgic gait pattern, does minimize ankle pain as noted from last AFO trialed  Townsend Lateral Strut Anterior Guard  GAIT: Gait pattern: decreased step length- Left, decreased stance time- Left, decreased hip/knee flexion- Left, decreased ankle dorsiflexion- Left, wide BOS, and poor foot clearance- Left - poor eccentric control of L plantarflexors, Antalgic pattern is reduced Distance walked: 1 x 230 feet  Assistive device utilized: Townsend Lateral Strut Anterior Guard Comments: Reduced antalgic gait pattern, brace did not fit ideally as too large  but patient did report feeling increased knee stability, continues to demonstrate poor eccentric plantarflexion control   PATIENT EDUCATION: Education details: Continue HEP  Person educated: Patient Education method: Medical illustrator Education comprehension: verbalized understanding, returned demonstration, verbal cues required, and needs further education  HOME EXERCISE PROGRAM: Access Code: [email or web address] URL: [email or web address] Date: 09/10/2022 Prepared by: Maryruth Eve   Exercises - Single Leg Bridge  - 1 x daily - 7 x weekly - 3 sets - 12 reps - Active Straight Leg Raise with Quad Set  - 1 x daily - 7 x weekly - 3 sets - 10 reps - Seated March with Hip abduction/adduction  - 1 x daily - 7 x weekly - 3 sets - 10 reps  GOALS: Goals reviewed with patient? Yes  SHORT TERM GOALS: Target date: 09/17/2022  Patient will demonstrate 100% compliance with initial HEP to continue to progress between physical therapy sessions.   Baseline: reports partial compliance Goal status: NOT met  2. Patient will complete 1x sit to stand without UE use to demonstrate improved LE strength and decreased falls risk. Baseline: Requires bilateral use of UE; 7x without UE use Goal status: MET  3.  Patient will improve gait speed to 0.77 m/s with LRAD to indicate improvement towards the level of community ambulator in order to participate more easily in activities outside of the home.   Baseline: 0.67 m/s without AD; improved to 0.88 m/s Goal status: MET  4.  Patient will tolerate entire duration of with LRAD to indicated improved cardiorespiratory endurance for shopping outside of home.  Baseline: tolerated first 3 minutes (478 feet); able to tolerate 4 minutes without AD for 640 feet Goal status: NOT MET  5.  Patient will improve Berg Balance score by 9 points to indicate clinically significant progress towards a decreased risk of falls and improved static stability.   Baseline: 33/56 improved to 42/96 Goal status: MET  LONG TERM GOALS: Target date: 10/29/2022  Patient will report demonstrate independence with final HEP in order to maintain current gains and continue to progress after physical therapy discharge.   Baseline: To be provided Goal status: INITIAL  2.  Patient will complete 5xSTS without UE in < 18 seconds to indicate improved LE strength.  Baseline: 13.34" with bilateral UE support (SBA); 19.5 seconds without UE support Goal status: REVISED  3.  Patient will improve gait speed to 0.90 m/s with LRAD to indicate improvement to the level of community ambulator in order to participate more easily in activities outside of the home.   Baseline: 0.67 m/s without AD; improved to 0.88 m/s  Goal status: REVISED  4.  Patient will improve with LRAD by 50 meters to achieve age reported norms for aerobic capacity and endurance in order to participate more easily in daily walking tasks.   Baseline: 145.6 meters without AD (CGA with minA x 1 due to LOB around curve) Goal status: INITIAL  5.  Patient will improve FOTO score to 74 to achieve predicted improvements in functional mobility due to skilled physical therapy interventions to increase safety with and participation in daily activities.  Baseline: 60 Goal status: INITIAL  6.  Patient will improve Berg Balance score to 45/56 or greater to indicate a decreased risk of falls and improved static stability.   Baseline: 33/56; improved to 42/56 Goal status: IN PROGRESS  ASSESSMENT:  CLINICAL IMPRESSION: Patient continues to reports moderate-high levels of L knee pain in today's session. Therapist educates patient that without bracing or AD to help offload knee and minimize gait deviations, there is little that can be done to help manage pain while keeping patient active. Patient reports willininess to trial AFO for first time in today's session. Patient demonstrates greatest stability with  anterior guard  lateral strut AFO; however, limited eccentric contro of plantarflexors continues to be noted in addition to ongoing questions about knee stability. Patient may benefit from custom thermoplastic AFO but recommend further trials to help determine best fit. Continue POC.   OBJECTIVE IMPAIRMENTS: Abnormal gait, decreased activity tolerance, decreased balance, decreased endurance, difficulty walking, decreased ROM, decreased strength, decreased safety awareness, impaired sensation, and impaired tone.   ACTIVITY LIMITATIONS: carrying, standing, squatting, and locomotion level  PARTICIPATION LIMITATIONS: cleaning, shopping, and community activity  PERSONAL FACTORS: Behavior pattern, Past/current experiences, and 3+ comorbidities: see above  are also affecting patient's functional outcome.   REHAB POTENTIAL: Fair Patient resistant to trial of AD during today's session, otherwise patient strongly motivated to defy limitations  CLINICAL DECISION MAKING: Evolving/moderate complexity  EVALUATION COMPLEXITY: Moderate  PLAN:  PT FREQUENCY: 2x/week  PT DURATION: 8 weeks  PLANNED INTERVENTIONS: Therapeutic exercises, Therapeutic activity, Neuromuscular re-education, Balance training, Gait training, Patient/Family education, Self Care, Joint mobilization, DME instructions, Manual therapy, and Re-evaluation  PLAN FOR NEXT SESSION:  Bioness with quad cuff, gait training as tolerated, NMR, quad strength of LLE, step over/stance tasks; thermoplastic AFO trial, has bruising on knee reduced  Maryruth Eve, PT, DPT [facility] [address] [place], [place]  [address] Phone:  [phone] Fax:  [phone] 10/04/22, 11:28 AM   Check all possible CPT codes: [phone] - PT Re-evaluation, 97110- Therapeutic Exercise, [phone]- Neuro Re-education, [phone] - Gait Training, [phone] - Manual Therapy, 97530 - Therapeutic Activities, and 97535 - Self Care    Check all conditions that  are expected to impact treatment: {Conditions expected to impact treatment:Respiratory disorders, Diabetes mellitus, and Neurological condition and/or seizures   If treatment provided at initial evaluation, no treatment charged due to lack of authorization.

## 2022-10-08 ENCOUNTER — Encounter: Payer: Self-pay | Admitting: Physical Therapy

## 2022-10-08 ENCOUNTER — Ambulatory Visit: Payer: Medicaid Other | Admitting: Physical Therapy

## 2022-10-08 DIAGNOSIS — R269 Unspecified abnormalities of gait and mobility: Secondary | ICD-10-CM

## 2022-10-08 DIAGNOSIS — R2689 Other abnormalities of gait and mobility: Secondary | ICD-10-CM | POA: Diagnosis not present

## 2022-10-08 DIAGNOSIS — M6281 Muscle weakness (generalized): Secondary | ICD-10-CM | POA: Diagnosis not present

## 2022-10-08 DIAGNOSIS — R2681 Unsteadiness on feet: Secondary | ICD-10-CM

## 2022-10-08 NOTE — Therapy (Signed)
OUTPATIENT PHYSICAL THERAPY NEURO TREATMENT   Patient Name: Craig West MRN: [id number] DOB:1965/05/24, 57 y.o., male Today's Date: 10/08/2022   PCP: Hoy Register, MD REFERRING PROVIDER: Hoy Register, MD  Physical Therapy Progress Note   Dates of Reporting Period:08/20/22 - 09/27/22  See Note below for Objective Data and Assessment of Progress/Goals.    END OF SESSION:  PT End of Session - 10/08/22 0810     Visit Number 13    Number of Visits 17    Date for PT Re-Evaluation 10/29/22    Authorization Type  Medicaid    Progress Note Due on Visit 10    PT Start Time 0803    PT Stop Time 0847    PT Time Calculation (min) 44 min    Equipment Utilized During Treatment Gait belt;Other (comment)    Activity Tolerance Patient tolerated treatment well    Behavior During Therapy [facility] for tasks assessed/performed             Past Medical History:  Diagnosis Date   Asthma    CHF (congestive heart failure) (HCC)    CKD (chronic kidney disease), stage IV (HCC)    COVID-19 virus infection 04/2019   Diabetes mellitus without complication (HCC)    Fatigue 11/23/2020   Gout    Hypertension    Leg heaviness 11/23/2020   NSVT (nonsustained ventricular tachycardia) (HCC) 11/23/2020   Rectal cancer [facility])    Past Surgical History:  Procedure Laterality Date   BIOPSY  01/11/2020   Procedure: BIOPSY;  Surgeon: Hilarie Fredrickson, MD;  Location: [facility] ENDOSCOPY;  Service: Endoscopy;;   COLONOSCOPY WITH PROPOFOL N/A 01/11/2020   Procedure: COLONOSCOPY WITH PROPOFOL;  Surgeon: Hilarie Fredrickson, MD;  Location: [facility] ENDOSCOPY;  Service: Endoscopy;  Laterality: N/A;   NO PAST SURGERIES     POLYPECTOMY  01/11/2020   Procedure: POLYPECTOMY;  Surgeon: Hilarie Fredrickson, MD;  Location: [facility] ENDOSCOPY;  Service: Endoscopy;;   SUBMUCOSAL TATTOO INJECTION  01/11/2020   Procedure: SUBMUCOSAL TATTOO INJECTION;  Surgeon: Hilarie Fredrickson, MD;  Location: [facility] ENDOSCOPY;  Service: Endoscopy;;   Patient Active  Problem List   Diagnosis Date Noted   Hemiparesis affecting left side as late effect of stroke (HCC) 08/05/2022   Stroke (HCC) 05/05/2022   Hemorrhagic stroke (HCC) 05/05/2022   Hypertensive emergency 05/05/2022   Nontraumatic intracerebral hemorrhage (HCC) 05/05/2022   BPH (benign prostatic hyperplasia) 03/27/2021   Fatigue 11/23/2020   Leg heaviness 11/23/2020   NSVT (nonsustained ventricular tachycardia) (HCC) 11/23/2020   Diabetes mellitus without complication (HCC)    CKD (chronic kidney disease), stage IV (HCC)    PICC (peripherally inserted central catheter) in place 03/27/2020   Rectal cancer (HCC) 01/27/2020   Benign neoplasm of ascending colon    Benign neoplasm of descending colon    Benign neoplasm of sigmoid colon    Rectal mass    Rectal bleeding    Iron deficiency anemia    Chronic diastolic heart failure (HCC)    Hyperkalemia    Volume overload 01/06/2020   Demand ischemia    COVID-19 virus infection 04/2019   Obesity (BMI 30.0-34.9) 12/09/2017   Non compliance w medication regimen 04/18/2017   Gout 08/02/2016   Diabetic neuropathy (HCC) 08/02/2016   Hyperlipidemia 10/24/2015   Asthma 10/16/2015   Type 2 diabetes mellitus without complication (HCC) 06/13/2014   Essential hypertension 06/13/2014   Smoker 06/13/2014    ONSET DATE: 08/05/2022 (referral)  REFERRING DIAG: H84.696 (ICD-10-CM) - Hemiparesis affecting left  side as late effect of stroke (HCC)  THERAPY DIAG:  Unsteadiness on feet  Muscle weakness (generalized)  Other abnormalities of gait and mobility  Abnormality of gait and mobility  Rationale for Evaluation and Treatment: Rehabilitation  SUBJECTIVE:                                                                                                                                                                                             SUBJECTIVE STATEMENT: Pt reports  Pt accompanied by: self  PERTINENT HISTORY: left-sided weakness  and CT scan revealed hemorrhagic stroke thought to be ischemic with conversion 05/05/2022, hypertension, type 2 diabetes mellitus, asthma, gout, stage IV chronic kidney disease, Hypertensive Heart Disease, Rectal carcinoma diagnosed in 12/2018 (completed chemotherapy and radiation), s/p Low anterior resection with diverting Colostomy at [facility] on 12/06/20 with subsequent reversal of colostomy PAIN:  Are you having pain? Yes: NPRS scale: 2/10 Pain location: L lateral knee Pain description: achy, sore Aggravating factors: putting pressure on it Relieving factors: nothing  PRECAUTIONS: Fall  WEIGHT BEARING RESTRICTIONS: No  FALLS: Has patient fallen in last 6 months? No  LIVING ENVIRONMENT: Lives with:  lives with friends Lives in: House/apartment Stairs: No Has following equipment at home: Single point cane, Environmental consultant - 2 wheeled, shower chair, and Grab bars  PLOF: Independent - On Disability prior to stroke; Patient states he could run/move freely prior to the stroke  PATIENT GOALS: "I want to run."   OBJECTIVE:   DIAGNOSTIC FINDINGS:   05/07/2022 MR Brain wo Contrasts: IMPRESSION: 1. Right thalamic hematoma is stable since 05/05/2022, estimated blood volume of 3 mL. Regional edema but no significant intracranial mass effect. And no IVH, ventriculomegaly or other complicating features. 2. Negative motion degraded intracranial MRA. And evidence of chronic small vessel disease, with other chronic microhemorrhages in the left thalamus, left pons, and also the deep left cerebellum. Suspect some encephalomalacia in the cerebellum, and also some chronic Wallerian degeneration in the right brainstem. 3. No other acute intracranial abnormality.  05/06/2024 CT Head w/o Contrast: IMPRESSION: 1. No significant interval change in size and morphology of intraparenchymal hematoma centered at the right thalamus. Mild localized edema without significant regional mass effect. 2. No other new acute  intracranial abnormality.  05/05/2022 CT Head wo Contrast: IMPRESSION: Acute hypertensive type hemorrhage in the right thalamus measuring 2.6 mL.   COGNITION: Overall cognitive status: Within functional limits for tasks assessed   SENSATION: Numbness on L hemiparetic side  COORDINATION: Mild dysmetria noted on hemiparetic side on L  EDEMA:  WNL  MUSCLE TONE: LLE: Modifed Ashworth Scale 1+ = Slight increase in muscle  tone, manifested by a catch, followed by minimal resistance throughout the remainder (less than half) of the ROM  POSTURE: rounded shoulders and forward head  LOWER EXTREMITY ROM:       L hemiparesis results in decreased ROM on L side primarily   LOWER EXTREMITY MMT:    Held due to presence of tone on LLE but increased weakness noted on this side functionally with sit to stand and bias for RLE, grossly 4+/5 on RLE in hip flexor, knee extensors, knee flexors, dorsiflexors  VITALS  There were no vitals filed for this visit.  BP initially out of safe therapeutic range. Had patient rest for a few minutes and then retested. Encouraged patient to continue to closely monitor and may require calling PCP to adjust medications if continues to notice rising trends. Patient did take BP medication this morning.   TODAY'S TREATMENT:    TherAct: - Patient to have 1+ edema on lateral and medial aspect of L knee with possible increased osseous tissue noted as well. Therapist asked another PT for second opinion who performed modified seated McMurray's test which was negative thought patient does report join line tenderness bilaterally. Recommended patient follow up with PCP for second opinion. Given reduction in swelling from what patient reported over weekend and decreased pain continued POC.   Gait (AFO/Bracing Trial Continued):  Gait without bracing  GAIT: Gait pattern: decreased step length- Left, decreased stance time- Left, decreased hip/knee flexion- Left, decreased  ankle dorsiflexion- Left, wide BOS, and poor foot clearance- Left - poor eccentric control of L plantarflexors, Antalgic Distance walked: 2 x 150 feet (walking into/out of clinic) Assistive device utilized: None Level of assistance: SBA Comments: Reduced pain noted from last session, notable WBOS and knee valgus on LLE mixture of increased flexion/hyperextension pending on step  Swedish Knee Cage  GAIT: Gait pattern: decreased step length- Left, decreased stance time- Left, decreased hip/knee flexion- Left, decreased ankle dorsiflexion- Left, wide BOS, and poor foot clearance- Left - poor eccentric control of L plantarflexors Distance walked: 1 x 80 feet  Assistive device utilized: Swedish knee cage on LLE Level of assistance: SBA Comments: Reports moderate reduction in knee pain, continues to demonstrate foot slap without anke control but does provided increased stability to medial side of knee though places patient into apparent mild knee flexion on the L  Townsend Lateral Strut Anterior Guard - Proximal Guard only  GAIT: Gait pattern: decreased step length- Left, decreased stance time- Left, decreased hip/knee flexion- Left, decreased ankle dorsiflexion- Left, wide BOS, and poor foot clearance- Left - poor eccentric control of L plantarflexors, Antalgic pattern is reduced Distance walked: 1 x 80 feet  Assistive device utilized: Townsend Lateral Strut Anterior Guard at top of brace Comments: Continues to be limited by fit of brace mild, reduced knee control compared to above option  Townsend Lateral Strut Anterior Guard - Shin Guard  GAIT: Gait pattern: decreased step length- Left, decreased stance time- Left, decreased hip/knee flexion- Left, decreased ankle dorsiflexion- Left, wide BOS, and poor foot clearance- Left - poor eccentric control of L plantarflexors, Antalgic pattern Distance walked: 1 x 80 feet  Assistive device utilized: Townsend Lateral Strut Anterior Guard with full  shin cover Comments: Improves ankle control and reduced poor eccentric control however results in increase in knee hyperextension resulting in mild increase in knee pain  Townsend Lateral Strut Posterior Guard + Swedish Knee Cage  GAIT: Gait pattern: decreased step length- Left, decreased stance time- Left, decreased hip/knee flexion- Left,  decreased ankle dorsiflexion- Left, wide BOS, and improved foot control noted decreased antalgic gait pattern  Distance walked: 1 x 80 feet  Assistive device utilized: Townsend Lateral Strut Posterior Guard Level of assistance: SBA Comments: Tolerates well and reports greatest relief as compared to all combinations thus far demonstrates mild decrease in BOS and reduced circumduction also noted with improved foot alignment on LLE  Showed examples of custom thermo plastics and discussed possible benefit of these forms of bracing  PATIENT EDUCATION: Education details: Continue HEP + follow up with PCP about knee + consider if wanting to pursue bracing Person educated: Patient Education method: Medical illustrator Education comprehension: verbalized understanding, returned demonstration, verbal cues required, and needs further education  HOME EXERCISE PROGRAM: Access Code: [email or web address] URL: [email or web address] Date: 09/10/2022 Prepared by: Maryruth Eve   Exercises - Single Leg Bridge  - 1 x daily - 7 x weekly - 3 sets - 12 reps - Active Straight Leg Raise with Quad Set  - 1 x daily - 7 x weekly - 3 sets - 10 reps - Seated March with Hip abduction/adduction  - 1 x daily - 7 x weekly - 3 sets - 10 reps  GOALS: Goals reviewed with patient? Yes  SHORT TERM GOALS: Target date: 09/17/2022  Patient will demonstrate 100% compliance with initial HEP to continue to progress between physical therapy sessions.   Baseline: reports partial compliance Goal status: NOT met  2. Patient will complete 1x sit to stand without UE use to  demonstrate improved LE strength and decreased falls risk. Baseline: Requires bilateral use of UE; 7x without UE use Goal status: MET  3.  Patient will improve gait speed to 0.77 m/s with LRAD to indicate improvement towards the level of community ambulator in order to participate more easily in activities outside of the home.   Baseline: 0.67 m/s without AD; improved to 0.88 m/s Goal status: MET  4.  Patient will tolerate entire duration of with LRAD to indicated improved cardiorespiratory endurance for shopping outside of home.  Baseline: tolerated first 3 minutes (478 feet); able to tolerate 4 minutes without AD for 640 feet Goal status: NOT MET  5.  Patient will improve Berg Balance score by 9 points to indicate clinically significant progress towards a decreased risk of falls and improved static stability.  Baseline: 33/56 improved to 42/96 Goal status: MET  LONG TERM GOALS: Target date: 10/29/2022  Patient will report demonstrate independence with final HEP in order to maintain current gains and continue to progress after physical therapy discharge.   Baseline: To be provided Goal status: INITIAL  2.  Patient will complete 5xSTS without UE in < 18 seconds to indicate improved LE strength.  Baseline: 13.34" with bilateral UE support (SBA); 19.5 seconds without UE support Goal status: REVISED  3.  Patient will improve gait speed to 0.90 m/s with LRAD to indicate improvement to the level of community ambulator in order to participate more easily in activities outside of the home.   Baseline: 0.67 m/s without AD; improved to 0.88 m/s  Goal status: REVISED  4.  Patient will improve with LRAD by 50 meters to achieve age reported norms for aerobic capacity and endurance in order to participate more easily in daily walking tasks.   Baseline: 145.6 meters without AD (CGA with minA x 1 due to LOB around curve) Goal status: INITIAL  5.  Patient will improve FOTO score to 74 to  achieve predicted improvements in functional mobility  due to skilled physical therapy interventions to increase safety with and participation in daily activities.  Baseline: 60 Goal status: INITIAL  6.  Patient will improve Berg Balance score to 45/56 or greater to indicate a decreased risk of falls and improved static stability.   Baseline: 33/56; improved to 42/56 Goal status: IN PROGRESS  ASSESSMENT:  CLINICAL IMPRESSION: Session initiated with assessment of knee as patient reported swelling over weekend. Determined safe to continue session but recommended patient follow up with PCP as well. Continued bracing/AFO assessment. Greatest reduction in pain and stability noted with use of swedish knee cage and posterior guard lateral strut townsend brace in today's session. Patient has mix of knee flexion/hyperextension which makes determining most appropriate brace challenge. May continue to benefit from custom thermoplastic brace but will assess as able. Continue POC.   OBJECTIVE IMPAIRMENTS: Abnormal gait, decreased activity tolerance, decreased balance, decreased endurance, difficulty walking, decreased ROM, decreased strength, decreased safety awareness, impaired sensation, and impaired tone.   ACTIVITY LIMITATIONS: carrying, standing, squatting, and locomotion level  PARTICIPATION LIMITATIONS: cleaning, shopping, and community activity  PERSONAL FACTORS: Behavior pattern, Past/current experiences, and 3+ comorbidities: see above  are also affecting patient's functional outcome.   REHAB POTENTIAL: Fair Patient resistant to trial of AD during today's session, otherwise patient strongly motivated to defy limitations  CLINICAL DECISION MAKING: Evolving/moderate complexity  EVALUATION COMPLEXITY: Moderate  PLAN:  PT FREQUENCY: 2x/week  PT DURATION: 8 weeks  PLANNED INTERVENTIONS: Therapeutic exercises, Therapeutic activity, Neuromuscular re-education, Balance training, Gait training,  Patient/Family education, Self Care, Joint mobilization, DME instructions, Manual therapy, and Re-evaluation  PLAN FOR NEXT SESSION:  Bioness with quad cuff, gait training as tolerated, NMR, quad strength of LLE, step over/stance tasks; continue to explore AFO options  Maryruth Eve, PT, DPT [facility] [address] [place], [place]  [address] Phone:  [phone] Fax:  [phone] 10/08/22, 11:03 AM   Check all possible CPT codes: 97164 - PT Re-evaluation, 97110- Therapeutic Exercise, [phone]- Neuro Re-education, [phone] - Gait Training, [phone] - Manual Therapy, 97530 - Therapeutic Activities, and [phone] - Self Care    Check all conditions that are expected to impact treatment: {Conditions expected to impact treatment:Respiratory disorders, Diabetes mellitus, and Neurological condition and/or seizures   If treatment provided at initial evaluation, no treatment charged due to lack of authorization.

## 2022-10-11 ENCOUNTER — Ambulatory Visit: Payer: Medicaid Other | Admitting: Physical Therapy

## 2022-10-11 ENCOUNTER — Encounter: Payer: Self-pay | Admitting: Physical Therapy

## 2022-10-11 VITALS — BP 146/86 | HR 56

## 2022-10-11 DIAGNOSIS — M6281 Muscle weakness (generalized): Secondary | ICD-10-CM

## 2022-10-11 DIAGNOSIS — R2689 Other abnormalities of gait and mobility: Secondary | ICD-10-CM | POA: Diagnosis not present

## 2022-10-11 DIAGNOSIS — R2681 Unsteadiness on feet: Secondary | ICD-10-CM

## 2022-10-11 DIAGNOSIS — R269 Unspecified abnormalities of gait and mobility: Secondary | ICD-10-CM

## 2022-10-11 NOTE — Therapy (Signed)
OUTPATIENT PHYSICAL THERAPY NEURO TREATMENT   Patient Name: Craig West MRN: [id number] DOB:12-03-1965, 57 y.o., male Today's Date: 10/11/2022   PCP: Hoy Register, MD REFERRING PROVIDER: Hoy Register, MD  Physical Therapy Progress Note   Dates of Reporting Period:08/20/22 - 09/27/22  See Note below for Objective Data and Assessment of Progress/Goals.    END OF SESSION:  PT End of Session - 10/11/22 0804     Visit Number 14    Number of Visits 17    Date for PT Re-Evaluation 10/29/22    Authorization Type [place] Medicaid    Progress Note Due on Visit 10    PT Start Time 0802    PT Stop Time 0850    PT Time Calculation (min) 48 min    Equipment Utilized During Treatment Gait belt    Activity Tolerance Patient tolerated treatment well    Behavior During Therapy WFL for tasks assessed/performed             Past Medical History:  Diagnosis Date   Asthma    CHF (congestive heart failure) (HCC)    CKD (chronic kidney disease), stage IV (HCC)    COVID-19 virus infection 04/2019   Diabetes mellitus without complication (HCC)    Fatigue 11/23/2020   Gout    Hypertension    Leg heaviness 11/23/2020   NSVT (nonsustained ventricular tachycardia) (HCC) 11/23/2020   Rectal cancer (HCC)    Past Surgical History:  Procedure Laterality Date   BIOPSY  01/11/2020   Procedure: BIOPSY;  Surgeon: Hilarie Fredrickson, MD;  Location: [facility] ENDOSCOPY;  Service: Endoscopy;;   COLONOSCOPY WITH PROPOFOL N/A 01/11/2020   Procedure: COLONOSCOPY WITH PROPOFOL;  Surgeon: Hilarie Fredrickson, MD;  Location: [facility] ENDOSCOPY;  Service: Endoscopy;  Laterality: N/A;   NO PAST SURGERIES     POLYPECTOMY  01/11/2020   Procedure: POLYPECTOMY;  Surgeon: Hilarie Fredrickson, MD;  Location: [facility] ENDOSCOPY;  Service: Endoscopy;;   SUBMUCOSAL TATTOO INJECTION  01/11/2020   Procedure: SUBMUCOSAL TATTOO INJECTION;  Surgeon: Hilarie Fredrickson, MD;  Location: [facility] ENDOSCOPY;  Service: Endoscopy;;   Patient Active Problem List    Diagnosis Date Noted   Hemiparesis affecting left side as late effect of stroke (HCC) 08/05/2022   Stroke (HCC) 05/05/2022   Hemorrhagic stroke (HCC) 05/05/2022   Hypertensive emergency 05/05/2022   Nontraumatic intracerebral hemorrhage (HCC) 05/05/2022   BPH (benign prostatic hyperplasia) 03/27/2021   Fatigue 11/23/2020   Leg heaviness 11/23/2020   NSVT (nonsustained ventricular tachycardia) (HCC) 11/23/2020   Diabetes mellitus without complication (HCC)    CKD (chronic kidney disease), stage IV (HCC)    PICC (peripherally inserted central catheter) in place 03/27/2020   Rectal cancer (HCC) 01/27/2020   Benign neoplasm of ascending colon    Benign neoplasm of descending colon    Benign neoplasm of sigmoid colon    Rectal mass    Rectal bleeding    Iron deficiency anemia    Chronic diastolic heart failure (HCC)    Hyperkalemia    Volume overload 01/06/2020   Demand ischemia    COVID-19 virus infection 04/2019   Obesity (BMI 30.0-34.9) 12/09/2017   Non compliance w medication regimen 04/18/2017   Gout 08/02/2016   Diabetic neuropathy (HCC) 08/02/2016   Hyperlipidemia 10/24/2015   Asthma 10/16/2015   Type 2 diabetes mellitus without complication (HCC) 06/13/2014   Essential hypertension 06/13/2014   Smoker 06/13/2014    ONSET DATE: 08/05/2022 (referral)  REFERRING DIAG: W09.811 (ICD-10-CM) - Hemiparesis affecting left side  as late effect of stroke (HCC)  THERAPY DIAG:  Unsteadiness on feet  Muscle weakness (generalized)  Other abnormalities of gait and mobility  Abnormality of gait and mobility  Rationale for Evaluation and Treatment: Rehabilitation  SUBJECTIVE:                                                                                                                                                                                             SUBJECTIVE STATEMENT: Pt reports that he is not wanting to pursue an AFO at this time after thinking about it. Is  wanting to work on bioness.  Denies falls/near falls.   Pt accompanied by: self  PERTINENT HISTORY: left-sided weakness and CT scan revealed hemorrhagic stroke thought to be ischemic with conversion 05/05/2022, hypertension, type 2 diabetes mellitus, asthma, gout, stage IV chronic kidney disease, Hypertensive Heart Disease, Rectal carcinoma diagnosed in 12/2018 (completed chemotherapy and radiation), s/p Low anterior resection with diverting Colostomy at [facility] on 12/06/20 with subsequent reversal of colostomy  PAIN:  Are you having pain? No  PRECAUTIONS: Fall  WEIGHT BEARING RESTRICTIONS: No  FALLS: Has patient fallen in last 6 months? No  LIVING ENVIRONMENT: Lives with:  lives with friends Lives in: House/apartment Stairs: No Has following equipment at home: Single point cane, Environmental consultant - 2 wheeled, shower chair, and Grab bars  PLOF: Independent - On Disability prior to stroke; Patient states he could run/move freely prior to the stroke  PATIENT GOALS: "I want to run."   OBJECTIVE:   DIAGNOSTIC FINDINGS:   05/07/2022 MR Brain wo Contrasts: IMPRESSION: 1. Right thalamic hematoma is stable since 05/05/2022, estimated blood volume of 3 mL. Regional edema but no significant intracranial mass effect. And no IVH, ventriculomegaly or other complicating features. 2. Negative motion degraded intracranial MRA. And evidence of chronic small vessel disease, with other chronic microhemorrhages in the left thalamus, left pons, and also the deep left cerebellum. Suspect some encephalomalacia in the cerebellum, and also some chronic Wallerian degeneration in the right brainstem. 3. No other acute intracranial abnormality.  05/06/2024 CT Head w/o Contrast: IMPRESSION: 1. No significant interval change in size and morphology of intraparenchymal hematoma centered at the right thalamus. Mild localized edema without significant regional mass effect. 2. No other new acute intracranial  abnormality.  05/05/2022 CT Head wo Contrast: IMPRESSION: Acute hypertensive type hemorrhage in the right thalamus measuring 2.6 mL.   COGNITION: Overall cognitive status: Within functional limits for tasks assessed   SENSATION: Numbness on L hemiparetic side  COORDINATION: Mild dysmetria noted on hemiparetic side on L  EDEMA:  WNL  MUSCLE TONE: LLE: Modifed Ashworth Scale  1+ = Slight increase in muscle tone, manifested by a catch, followed by minimal resistance throughout the remainder (less than half) of the ROM  POSTURE: rounded shoulders and forward head  LOWER EXTREMITY ROM:       L hemiparesis results in decreased ROM on L side primarily   LOWER EXTREMITY MMT:    Held due to presence of tone on LLE but increased weakness noted on this side functionally with sit to stand and bias for RLE, grossly 4+/5 on RLE in hip flexor, knee extensors, knee flexors, dorsiflexors  VITALS  Vitals:   10/11/22 0810 10/11/22 0838  BP: 130/79 (!) 146/86  Pulse: (!) 57 (!) 56    TODAY'S TREATMENT:    TherAct: - Assessed vitals. Second BP reading taken after exercise when patient initially reported increased L arm numbness during ambulation. Clarified later appears to possibly increase tone reducing UE motion/increasing stiffness. Recommended continued monitoring of BP and recommended following up with physician team if continued to get worse. Patient verbalized understanding. Was able to continue rest of session without difficulty.   E-stim Bioness set up to L anterior tibialis and L quads using quick fit electrodes for improved knee extension/stability and ankle DF w/gait training. See tablet 1 for details. Donned through duration of session for activities listed below.   NMR: Gait pattern: Improved heel strike and stride length; continues to demonstrate increased external rotation/out toeing of L leg likely due to hip flexor weakness on that side Distance walked: 1 x 230' Assistive  device utilized: None Level of assistance: SBA Comments: With Bioness on L quad and L anterior tibialis. Improved carryover noted from last session bioness used.    6" hurdle step overs with emphasis on "quiet steps" to cue eccentric lower and cues for heel strike; mix of leading with RLE for foot clearance and LLE for RLE stance 6 x 10' (CGA-minA for lateral LOB)  PATIENT EDUCATION: Education details: Continue HEP + follow up with PCP about knee + consider if wanting to pursue bracing Person educated: Patient Education method: Medical illustrator Education comprehension: verbalized understanding, returned demonstration, verbal cues required, and needs further education  HOME EXERCISE PROGRAM: Access Code: [email or web address] URL: [email or web address] Date: 09/10/2022 Prepared by: Maryruth Eve   Exercises - Single Leg Bridge  - 1 x daily - 7 x weekly - 3 sets - 12 reps - Active Straight Leg Raise with Quad Set  - 1 x daily - 7 x weekly - 3 sets - 10 reps - Seated March with Hip abduction/adduction  - 1 x daily - 7 x weekly - 3 sets - 10 reps  GOALS: Goals reviewed with patient? Yes  SHORT TERM GOALS: Target date: 09/17/2022  Patient will demonstrate 100% compliance with initial HEP to continue to progress between physical therapy sessions.   Baseline: reports partial compliance Goal status: NOT met  2. Patient will complete 1x sit to stand without UE use to demonstrate improved LE strength and decreased falls risk. Baseline: Requires bilateral use of UE; 7x without UE use Goal status: MET  3.  Patient will improve gait speed to 0.77 m/s with LRAD to indicate improvement towards the level of community ambulator in order to participate more easily in activities outside of the home.   Baseline: 0.67 m/s without AD; improved to 0.88 m/s Goal status: MET  4.  Patient will tolerate entire duration of with LRAD to indicated improved cardiorespiratory endurance for  shopping outside of home.  Baseline: tolerated first  3 minutes (478 feet); able to tolerate 4 minutes without AD for 640 feet Goal status: NOT MET  5.  Patient will improve Berg Balance score by 9 points to indicate clinically significant progress towards a decreased risk of falls and improved static stability.  Baseline: 33/56 improved to 42/96 Goal status: MET  LONG TERM GOALS: Target date: 10/29/2022  Patient will report demonstrate independence with final HEP in order to maintain current gains and continue to progress after physical therapy discharge.   Baseline: To be provided Goal status: INITIAL  2.  Patient will complete 5xSTS without UE in < 18 seconds to indicate improved LE strength.  Baseline: 13.34" with bilateral UE support (SBA); 19.5 seconds without UE support Goal status: REVISED  3.  Patient will improve gait speed to 0.90 m/s with LRAD to indicate improvement to the level of community ambulator in order to participate more easily in activities outside of the home.   Baseline: 0.67 m/s without AD; improved to 0.88 m/s  Goal status: REVISED  4.  Patient will improve with LRAD by 50 meters to achieve age reported norms for aerobic capacity and endurance in order to participate more easily in daily walking tasks.   Baseline: 145.6 meters without AD (CGA with minA x 1 due to LOB around curve) Goal status: INITIAL  5.  Patient will improve FOTO score to 74 to achieve predicted improvements in functional mobility due to skilled physical therapy interventions to increase safety with and participation in daily activities.  Baseline: 60 Goal status: INITIAL  6.  Patient will improve Berg Balance score to 45/56 or greater to indicate a decreased risk of falls and improved static stability.   Baseline: 33/56; improved to 42/56 Goal status: IN PROGRESS  ASSESSMENT:  CLINICAL IMPRESSION: Emphasis of skilled PT session on activities w/Bioness for improved sequencing and  muscle activation during gait. Patient demonstrates improved heel strike and eccentric lower with step overs in today's session. Per patient request, will not pursue AFO at this time. Patient also expressed interest at end the session of pursing OT so therapist will reach out on patient's behalf. Continue POC   OBJECTIVE IMPAIRMENTS: Abnormal gait, decreased activity tolerance, decreased balance, decreased endurance, difficulty walking, decreased ROM, decreased strength, decreased safety awareness, impaired sensation, and impaired tone.   ACTIVITY LIMITATIONS: carrying, standing, squatting, and locomotion level  PARTICIPATION LIMITATIONS: cleaning, shopping, and community activity  PERSONAL FACTORS: Behavior pattern, Past/current experiences, and 3+ comorbidities: see above  are also affecting patient's functional outcome.   REHAB POTENTIAL: Fair Patient resistant to trial of AD during today's session, otherwise patient strongly motivated to defy limitations  CLINICAL DECISION MAKING: Evolving/moderate complexity  EVALUATION COMPLEXITY: Moderate  PLAN:  PT FREQUENCY: 2x/week  PT DURATION: 8 weeks  PLANNED INTERVENTIONS: Therapeutic exercises, Therapeutic activity, Neuromuscular re-education, Balance training, Gait training, Patient/Family education, Self Care, Joint mobilization, DME instructions, Manual therapy, and Re-evaluation  PLAN FOR NEXT SESSION:  Bioness with quad cuff, gait training as tolerated, NMR, quad strength of LLE, step over/stance tasks; hold AFO options per patient request, follow up about OT referral  Maryruth Eve, PT, DPT [facility] [address] [place], [place]  [address] Phone:  [phone] Fax:  [phone] 10/11/22, 10:09 AM   Check all possible CPT codes: 97164 - PT Re-evaluation, 97110- Therapeutic Exercise, [phone]- Neuro Re-education, [phone] - Gait Training, 97140 - Manual Therapy, 97530 - Therapeutic Activities, and 97535 -  Self Care    Check all conditions that are  expected to impact treatment: {Conditions expected to impact treatment:Respiratory disorders, Diabetes mellitus, and Neurological condition and/or seizures   If treatment provided at initial evaluation, no treatment charged due to lack of authorization.

## 2022-10-15 ENCOUNTER — Ambulatory Visit: Payer: Medicaid Other | Admitting: Physical Therapy

## 2022-10-15 VITALS — BP 129/78 | HR 58

## 2022-10-15 DIAGNOSIS — R2689 Other abnormalities of gait and mobility: Secondary | ICD-10-CM | POA: Diagnosis not present

## 2022-10-15 DIAGNOSIS — R2681 Unsteadiness on feet: Secondary | ICD-10-CM | POA: Diagnosis not present

## 2022-10-15 DIAGNOSIS — R269 Unspecified abnormalities of gait and mobility: Secondary | ICD-10-CM | POA: Diagnosis not present

## 2022-10-15 DIAGNOSIS — M6281 Muscle weakness (generalized): Secondary | ICD-10-CM | POA: Diagnosis not present

## 2022-10-15 NOTE — Therapy (Addendum)
OUTPATIENT PHYSICAL THERAPY NEURO TREATMENT- DISCHARGE SUMMARY   Patient Name: Craig West MRN: [id number] DOB:04-27-1965, 57 y.o., male Today's Date: 10/15/2022   PCP: Hoy Register, MD REFERRING PROVIDER: Hoy Register, MD  PHYSICAL THERAPY DISCHARGE SUMMARY  Visits from Start of Care: 15  Current functional level related to goals / functional outcomes: Mod I without AD    Remaining deficits: L hemiparesis, low fall risk    Education / Equipment: HEP   Patient agrees to discharge. Patient goals were partially met. Patient is being discharged due to the patient's request.     END OF SESSION:  PT End of Session - 10/15/22 0805     Visit Number 15    Number of Visits 17    Date for PT Re-Evaluation 10/29/22    Authorization Type [place] [employer]    Progress Note Due on Visit 10    PT Start Time 0803    PT Stop Time 0850    PT Time Calculation (min) 47 min    Equipment Utilized During Treatment Gait belt    Activity Tolerance Patient tolerated treatment well    Behavior During Therapy WFL for tasks assessed/performed              Past Medical History:  Diagnosis Date   Asthma    CHF (congestive heart failure) (HCC)    CKD (chronic kidney disease), stage IV (HCC)    COVID-19 virus infection 04/2019   Diabetes mellitus without complication (HCC)    Fatigue 11/23/2020   Gout    Hypertension    Leg heaviness 11/23/2020   NSVT (nonsustained ventricular tachycardia) (HCC) 11/23/2020   Rectal cancer (HCC)    Past Surgical History:  Procedure Laterality Date   BIOPSY  01/11/2020   Procedure: BIOPSY;  Surgeon: Hilarie Fredrickson, MD;  Location: [facility] ENDOSCOPY;  Service: Endoscopy;;   COLONOSCOPY WITH PROPOFOL N/A 01/11/2020   Procedure: COLONOSCOPY WITH PROPOFOL;  Surgeon: Hilarie Fredrickson, MD;  Location: [facility] ENDOSCOPY;  Service: Endoscopy;  Laterality: N/A;   NO PAST SURGERIES     POLYPECTOMY  01/11/2020   Procedure: POLYPECTOMY;  Surgeon: Hilarie Fredrickson, MD;   Location: [facility] ENDOSCOPY;  Service: Endoscopy;;   SUBMUCOSAL TATTOO INJECTION  01/11/2020   Procedure: SUBMUCOSAL TATTOO INJECTION;  Surgeon: Hilarie Fredrickson, MD;  Location: [facility] ENDOSCOPY;  Service: Endoscopy;;   Patient Active Problem List   Diagnosis Date Noted   Hemiparesis affecting left side as late effect of stroke (HCC) 08/05/2022   Stroke (HCC) 05/05/2022   Hemorrhagic stroke (HCC) 05/05/2022   Hypertensive emergency 05/05/2022   Nontraumatic intracerebral hemorrhage (HCC) 05/05/2022   BPH (benign prostatic hyperplasia) 03/27/2021   Fatigue 11/23/2020   Leg heaviness 11/23/2020   NSVT (nonsustained ventricular tachycardia) (HCC) 11/23/2020   Diabetes mellitus without complication (HCC)    CKD (chronic kidney disease), stage IV (HCC)    PICC (peripherally inserted central catheter) in place 03/27/2020   Rectal cancer (HCC) 01/27/2020   Benign neoplasm of ascending colon    Benign neoplasm of descending colon    Benign neoplasm of sigmoid colon    Rectal mass    Rectal bleeding    Iron deficiency anemia    Chronic diastolic heart failure (HCC)    Hyperkalemia    Volume overload 01/06/2020   Demand ischemia    COVID-19 virus infection 04/2019   Obesity (BMI 30.0-34.9) 12/09/2017   Non compliance w medication regimen 04/18/2017   Gout 08/02/2016   Diabetic neuropathy (HCC) 08/02/2016  Hyperlipidemia 10/24/2015   Asthma 10/16/2015   Type 2 diabetes mellitus without complication (HCC) 06/13/2014   Essential hypertension 06/13/2014   Smoker 06/13/2014    ONSET DATE: 08/05/2022 (referral)  REFERRING DIAG: G29.528 (ICD-10-CM) - Hemiparesis affecting left side as late effect of stroke (HCC)  THERAPY DIAG:  Unsteadiness on feet  Muscle weakness (generalized)  Other abnormalities of gait and mobility  Rationale for Evaluation and Treatment: Rehabilitation  SUBJECTIVE:                                                                                                                                                                                              SUBJECTIVE STATEMENT: Pt reports that he thinks he is at a good place to DC today. Feels like he is a lot stronger and would like to save his therapy visits for later in the year.   Pt accompanied by: self  PERTINENT HISTORY: left-sided weakness and CT scan revealed hemorrhagic stroke thought to be ischemic with conversion 05/05/2022, hypertension, type 2 diabetes mellitus, asthma, gout, stage IV chronic kidney disease, Hypertensive Heart Disease, Rectal carcinoma diagnosed in 12/2018 (completed chemotherapy and radiation), s/p Low anterior resection with diverting Colostomy at [facility] on 12/06/20 with subsequent reversal of colostomy  PAIN:  Are you having pain? No  PRECAUTIONS: Fall  WEIGHT BEARING RESTRICTIONS: No  FALLS: Has patient fallen in last 6 months? No  LIVING ENVIRONMENT: Lives with:  lives with friends Lives in: House/apartment Stairs: No Has following equipment at home: Single point cane, Environmental consultant - 2 wheeled, shower chair, and Grab bars  PLOF: Independent - On Disability prior to stroke; Patient states he could run/move freely prior to the stroke  PATIENT GOALS: "I want to run."   OBJECTIVE:   DIAGNOSTIC FINDINGS:   05/07/2022 MR Brain wo Contrasts: IMPRESSION: 1. Right thalamic hematoma is stable since 05/05/2022, estimated blood volume of 3 mL. Regional edema but no significant intracranial mass effect. And no IVH, ventriculomegaly or other complicating features. 2. Negative motion degraded intracranial MRA. And evidence of chronic small vessel disease, with other chronic microhemorrhages in the left thalamus, left pons, and also the deep left cerebellum. Suspect some encephalomalacia in the cerebellum, and also some chronic Wallerian degeneration in the right brainstem. 3. No other acute intracranial abnormality.  05/06/2024 CT Head w/o Contrast: IMPRESSION: 1. No significant  interval change in size and morphology of intraparenchymal hematoma centered at the right thalamus. Mild localized edema without significant regional mass effect. 2. No other new acute intracranial abnormality.  05/05/2022 CT Head wo Contrast: IMPRESSION: Acute hypertensive type hemorrhage in the right thalamus measuring 2.6  mL.   COGNITION: Overall cognitive status: Within functional limits for tasks assessed   SENSATION: Numbness on L hemiparetic side  COORDINATION: Mild dysmetria noted on hemiparetic side on L  EDEMA:  WNL  MUSCLE TONE: LLE: Modifed Ashworth Scale 1+ = Slight increase in muscle tone, manifested by a catch, followed by minimal resistance throughout the remainder (less than half) of the ROM  POSTURE: rounded shoulders and forward head  LOWER EXTREMITY ROM:       L hemiparesis results in decreased ROM on L side primarily   LOWER EXTREMITY MMT:    Held due to presence of tone on LLE but increased weakness noted on this side functionally with sit to stand and bias for RLE, grossly 4+/5 on RLE in hip flexor, knee extensors, knee flexors, dorsiflexors  VITALS  Vitals:   10/15/22 0825  BP: 129/78  Pulse: (!) 58     TODAY'S TREATMENT:    TherAct: LTG Assessment  FOTO: 58   [facility] PT Assessment - 10/15/22 0824       Transfers   Five time sit to stand comments  12.56s no UE support      Ambulation/Gait   Gait velocity 32.8' 9.34s = 3.5 ft/s            Lengthy discussion regarding importance of continuing exercise and movement following DC from PT. Pt reports he is not doing his HEP but is walking frequently at work and "stepping over rocks and branches" so feels as though he is still working out. Pt inquiring about why he started having bowel incontinence post-stroke. Encouraged pt to reach out to PCP or GI doctor regarding this due to pt's history of rectal cancer. Pt verbalized understanding.  Educated pt on how to obtain new PT referral when he  is ready to return to PT. Pt verbalized understanding.  Pt inquiring as to why his LUE feels "heavy" and "tight". Suspect this is due to spasticity and encouraged pt to speak to PCP about this.     PATIENT EDUCATION: Education details: See above  Person educated: Patient Education method: Medical illustrator Education comprehension: verbalized understanding, returned demonstration, verbal cues required, and needs further education  HOME EXERCISE PROGRAM: Access Code: [email or web address] URL: [email or web address] Date: 09/10/2022 Prepared by: Maryruth Eve   Exercises - Single Leg Bridge  - 1 x daily - 7 x weekly - 3 sets - 12 reps - Active Straight Leg Raise with Quad Set  - 1 x daily - 7 x weekly - 3 sets - 10 reps - Seated March with Hip abduction/adduction  - 1 x daily - 7 x weekly - 3 sets - 10 reps  GOALS: Goals reviewed with patient? Yes  SHORT TERM GOALS: Target date: 09/17/2022  Patient will demonstrate 100% compliance with initial HEP to continue to progress between physical therapy sessions.   Baseline: reports partial compliance Goal status: NOT met  2. Patient will complete 1x sit to stand without UE use to demonstrate improved LE strength and decreased falls risk. Baseline: Requires bilateral use of UE; 7x without UE use Goal status: MET  3.  Patient will improve gait speed to 0.77 m/s with LRAD to indicate improvement towards the level of community ambulator in order to participate more easily in activities outside of the home.   Baseline: 0.67 m/s without AD; improved to 0.88 m/s Goal status: MET  4.  Patient will tolerate entire duration of with LRAD to indicated improved cardiorespiratory endurance for shopping outside  of home.  Baseline: tolerated first 3 minutes (478 feet); able to tolerate 4 minutes without AD for 640 feet Goal status: NOT MET  5.  Patient will improve Berg Balance score by 9 points to indicate clinically significant  progress towards a decreased risk of falls and improved static stability.  Baseline: 33/56 improved to 42/96 Goal status: MET  LONG TERM GOALS: Target date: 10/29/2022  Patient will report demonstrate independence with final HEP in order to maintain current gains and continue to progress after physical therapy discharge.   Baseline: Pt not performing  Goal status: NOT MET  2.  Patient will complete 5xSTS without UE in < 18 seconds to indicate improved LE strength.  Baseline: 13.34" with bilateral UE support (SBA); 19.5 seconds without UE support; 12.56s without UE support  Goal status: MET  3.  Patient will improve gait speed to 0.90 m/s with LRAD to indicate improvement to the level of community ambulator in order to participate more easily in activities outside of the home.   Baseline: 0.67 m/s without AD; improved to 0.88 m/s; 1.07 m/s  Goal status: MET  4.  Patient will improve with LRAD by 50 meters to achieve age reported norms for aerobic capacity and endurance in order to participate more easily in daily walking tasks.   Baseline: 145.6 meters without AD (CGA with minA x 1 due to LOB around curve) Goal status: NOT ASSESSED  5.  Patient will improve FOTO score to 74 to achieve predicted improvements in functional mobility due to skilled physical therapy interventions to increase safety with and participation in daily activities.  Baseline: 60; 58 (7/16) Goal status: NOT MET  6.  Patient will improve Berg Balance score to 45/56 or greater to indicate a decreased risk of falls and improved static stability.   Baseline: 33/56; improved to 42/56 Goal status: NOT ASSESSED  ASSESSMENT:  CLINICAL IMPRESSION: Emphasis of skilled PT session on LTG assessment and DC from PT per pt request. Pt states he feels stronger and would like to save therapy visits for later in the year. Pt has met 2 of 4 LTGs and 2 goals not assessed. Pt has significantly improved his gait speed without AD  and demonstrates increased stability of L knee in stance phase of gait. Pt declining AFO at this time. Pt has also improved his time on 5x STS, indicative of improved BLE strength and endurance. Due to time constraints, unable to assess Berg or , but per pt's last assessment, pt is a low fall risk and pt reports ability to go to grocery store without needing to sit down, indicative of improved endurance. Pt has not been doing his HEP but has been active at work. Pt verbalized understanding on how to return to PT when ready.   OBJECTIVE IMPAIRMENTS: Abnormal gait, decreased activity tolerance, decreased balance, decreased endurance, difficulty walking, decreased ROM, decreased strength, decreased safety awareness, impaired sensation, and impaired tone.   ACTIVITY LIMITATIONS: carrying, standing, squatting, and locomotion level  PARTICIPATION LIMITATIONS: cleaning, shopping, and community activity  PERSONAL FACTORS: Behavior pattern, Past/current experiences, and 3+ comorbidities: see above  are also affecting patient's functional outcome.   REHAB POTENTIAL: Fair Patient resistant to trial of AD during today's session, otherwise patient strongly motivated to defy limitations  CLINICAL DECISION MAKING: Evolving/moderate complexity  EVALUATION COMPLEXITY: Moderate  PLAN:  PT FREQUENCY: 2x/week  PT DURATION: 8 weeks  PLANNED INTERVENTIONS: Therapeutic exercises, Therapeutic activity, Neuromuscular re-education, Balance training, Gait training, Patient/Family education, Self Care,  Joint mobilization, DME instructions, Manual therapy, and Re-evaluation   Jill Alexanders Ikesha Siller, PT, DPT [facility] [address] [place], [place]  [address] Phone:  [phone] Fax:  [phone]  10/15/22, 8:50 AM   Check all possible CPT codes: [phone] - PT Re-evaluation, 97110- Therapeutic Exercise, [phone]- Neuro Re-education, [phone] - Gait Training, [phone] - Manual Therapy, [phone] -  Therapeutic Activities, and [phone] - Self Care    Check all conditions that are expected to impact treatment: {Conditions expected to impact treatment:Respiratory disorders, Diabetes mellitus, and Neurological condition and/or seizures   If treatment provided at initial evaluation, no treatment charged due to lack of authorization.

## 2022-10-16 ENCOUNTER — Emergency Department (HOSPITAL_COMMUNITY): Payer: Medicaid Other

## 2022-10-16 ENCOUNTER — Encounter (HOSPITAL_COMMUNITY): Payer: Self-pay

## 2022-10-16 ENCOUNTER — Emergency Department (HOSPITAL_COMMUNITY)
Admission: EM | Admit: 2022-10-16 | Discharge: 2022-10-16 | Disposition: A | Discharge status: Home / Self Care | Payer: Medicaid Other | Attending: Emergency Medicine | Admitting: Emergency Medicine

## 2022-10-16 ENCOUNTER — Other Ambulatory Visit: Payer: Self-pay

## 2022-10-16 DIAGNOSIS — J45909 Unspecified asthma, uncomplicated: Secondary | ICD-10-CM | POA: Insufficient documentation

## 2022-10-16 DIAGNOSIS — R202 Paresthesia of skin: Secondary | ICD-10-CM | POA: Diagnosis not present

## 2022-10-16 DIAGNOSIS — N189 Chronic kidney disease, unspecified: Secondary | ICD-10-CM | POA: Diagnosis not present

## 2022-10-16 DIAGNOSIS — R29818 Other symptoms and signs involving the nervous system: Secondary | ICD-10-CM | POA: Diagnosis not present

## 2022-10-16 DIAGNOSIS — G459 Transient cerebral ischemic attack, unspecified: Secondary | ICD-10-CM | POA: Diagnosis not present

## 2022-10-16 DIAGNOSIS — R531 Weakness: Secondary | ICD-10-CM | POA: Insufficient documentation

## 2022-10-16 DIAGNOSIS — I6381 Other cerebral infarction due to occlusion or stenosis of small artery: Secondary | ICD-10-CM | POA: Diagnosis not present

## 2022-10-16 DIAGNOSIS — R200 Anesthesia of skin: Secondary | ICD-10-CM | POA: Insufficient documentation

## 2022-10-16 DIAGNOSIS — Z79899 Other long term (current) drug therapy: Secondary | ICD-10-CM | POA: Diagnosis not present

## 2022-10-16 DIAGNOSIS — I509 Heart failure, unspecified: Secondary | ICD-10-CM | POA: Diagnosis not present

## 2022-10-16 DIAGNOSIS — G238 Other specified degenerative diseases of basal ganglia: Secondary | ICD-10-CM | POA: Diagnosis not present

## 2022-10-16 DIAGNOSIS — I130 Hypertensive heart and chronic kidney disease with heart failure and stage 1 through stage 4 chronic kidney disease, or unspecified chronic kidney disease: Secondary | ICD-10-CM | POA: Insufficient documentation

## 2022-10-16 DIAGNOSIS — R930 Abnormal findings on diagnostic imaging of skull and head, not elsewhere classified: Secondary | ICD-10-CM | POA: Diagnosis not present

## 2022-10-16 DIAGNOSIS — E1122 Type 2 diabetes mellitus with diabetic chronic kidney disease: Secondary | ICD-10-CM | POA: Diagnosis not present

## 2022-10-16 DIAGNOSIS — I672 Cerebral atherosclerosis: Secondary | ICD-10-CM | POA: Diagnosis not present

## 2022-10-16 LAB — CBC WITH DIFFERENTIAL/PLATELET
Abs Immature Granulocytes: 0.04 10*3/uL (ref 0.00–0.07)
Basophils Absolute: 0.1 10*3/uL (ref 0.0–0.1)
Basophils Relative: 1 %
Eosinophils Absolute: 0.2 10*3/uL (ref 0.0–0.5)
Eosinophils Relative: 2 %
HCT: 35.9 % — ABNORMAL LOW (ref 39.0–52.0)
Hemoglobin: 11 g/dL — ABNORMAL LOW (ref 13.0–17.0)
Immature Granulocytes: 0 %
Lymphocytes Relative: 11 %
Lymphs Abs: 1 10*3/uL (ref 0.7–4.0)
MCH: 27 pg (ref 26.0–34.0)
MCHC: 30.6 g/dL (ref 30.0–36.0)
MCV: 88 fL (ref 80.0–100.0)
Monocytes Absolute: 0.8 10*3/uL (ref 0.1–1.0)
Monocytes Relative: 8 %
Neutro Abs: 7.6 10*3/uL (ref 1.7–7.7)
Neutrophils Relative %: 78 %
Platelets: 211 10*3/uL (ref 150–400)
RBC: 4.08 MIL/uL — ABNORMAL LOW (ref 4.22–5.81)
RDW: 17.1 % — ABNORMAL HIGH (ref 11.5–15.5)
WBC: 9.7 10*3/uL (ref 4.0–10.5)
nRBC: 0 % (ref 0.0–0.2)

## 2022-10-16 LAB — I-STAT CHEM 8, ED
BUN: 36 mg/dL — ABNORMAL HIGH (ref 6–20)
Calcium, Ion: 1.03 mmol/L — ABNORMAL LOW (ref 1.15–1.40)
Chloride: 109 mmol/L (ref 98–111)
Creatinine, Ser: 3.8 mg/dL — ABNORMAL HIGH (ref 0.61–1.24)
Glucose, Bld: 107 mg/dL — ABNORMAL HIGH (ref 70–99)
HCT: 35 % — ABNORMAL LOW (ref 39.0–52.0)
Hemoglobin: 11.9 g/dL — ABNORMAL LOW (ref 13.0–17.0)
Potassium: 3.9 mmol/L (ref 3.5–5.1)
Sodium: 140 mmol/L (ref 135–145)
TCO2: 18 mmol/L — ABNORMAL LOW (ref 22–32)

## 2022-10-16 LAB — COMPREHENSIVE METABOLIC PANEL
ALT: 11 U/L (ref 0–44)
AST: 17 U/L (ref 15–41)
Albumin: 3.4 g/dL — ABNORMAL LOW (ref 3.5–5.0)
Alkaline Phosphatase: 109 U/L (ref 38–126)
Anion gap: 10 (ref 5–15)
BUN: 39 mg/dL — ABNORMAL HIGH (ref 6–20)
CO2: 19 mmol/L — ABNORMAL LOW (ref 22–32)
Calcium: 7.8 mg/dL — ABNORMAL LOW (ref 8.9–10.3)
Chloride: 109 mmol/L (ref 98–111)
Creatinine, Ser: 3.46 mg/dL — ABNORMAL HIGH (ref 0.61–1.24)
GFR, Estimated: 20 mL/min — ABNORMAL LOW (ref >60)
Glucose, Bld: 110 mg/dL — ABNORMAL HIGH (ref 70–99)
Potassium: 3.9 mmol/L (ref 3.5–5.1)
Sodium: 138 mmol/L (ref 135–145)
Total Bilirubin: 0.5 mg/dL (ref 0.3–1.2)
Total Protein: 7.3 g/dL (ref 6.5–8.1)

## 2022-10-16 LAB — CBG MONITORING, ED: Glucose-Capillary: 107 mg/dL — ABNORMAL HIGH (ref 70–99)

## 2022-10-16 LAB — APTT: aPTT: 34 seconds (ref 24–36)

## 2022-10-16 LAB — PROTIME-INR
INR: 1.5 — ABNORMAL HIGH (ref 0.8–1.2)
Prothrombin Time: 18.6 seconds — ABNORMAL HIGH (ref 11.4–15.2)

## 2022-10-16 LAB — ETHANOL: Alcohol, Ethyl (B): <10 mg/dL (ref <10)

## 2022-10-16 LAB — MAGNESIUM: Magnesium: 2.1 mg/dL (ref 1.7–2.4)

## 2022-10-16 MED ORDER — SODIUM CHLORIDE 0.9% FLUSH: 3.0000 mL | Freq: Once | INTRAVENOUS | Status: DC

## 2022-10-16 NOTE — ED Provider Triage Note (Addendum)
Emergency Medicine Provider Triage Evaluation Note  Craig West , a 57 y.o. male  was evaluated in triage.  History of prior stroke and rectal cancer.  Pt complains of left-sided numbness and weakness that noticed when he first woke up this morning approximately 8 AM.  He notes numbness in the left side of his face arm and leg.  Last known well was 10 PM last night.  It is more difficult to walk.  Review of Systems  Positive: As above Negative: As above  Physical Exam  BP (!) 147/94 (BP Location: Right Arm)   Pulse 72   Temp 98.3 F (36.8 C) (Oral)   Resp 19   SpO2 97%  Gen:   Awake, no distress   Resp:  Normal effort  MSK:   Moves extremities without difficulty  Other:   Pronator drift on the left arm Sensation intact in the upper and lower extremities bilaterally 5 out of 5 strength in the upper and lower extremities bilaterally Cranial nerves 3 through 12 intact  Medical Decision Making  Medically screening exam initiated at 3:34 PM.  Appropriate orders placed.  Craig West was informed that the remainder of the evaluation will be completed by another provider, this initial triage assessment does not replace that evaluation, and the importance of remaining in the ED until their evaluation is complete.     Craig Merles, PA-C 10/16/22 1536   Radiologist called 4:41 PM right thalamus hyper density that could be consistent with new bleed or reabsorption of old hemorrhage. Recommended follow up imaging in a couple hours. Order for repeat CT head placed   Craig West, [place] 10/16/22 1644

## 2022-10-16 NOTE — ED Provider Notes (Signed)
[place] [facility] AT [facility] Provider Note   CSN: [id number] Arrival date & time: 10/16/22  1500     History  Chief Complaint  Patient presents with   Numbness   Extremity Weakness   HPI Craig West is a 57 y.o. 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  States this morning around 8 AM he woke up and felt numb in his left arm and left leg.  States his last known well was 10 PM the night before when he went to sleep.  Denies visual disturbance, dizziness or lightheadedness.  Denies headache.  Endorses unsteady gait.  Denies fever.  He mentioned that he did have some left-sided weakness after his stroke in February.  States he is taking his blood pressure medication as prescribed.   Extremity Weakness       Home Medications Prior to Admission medications   Medication Sig Start Date End Date Taking? Authorizing Provider  albuterol (PROVENTIL) (2.5 MG/3ML) 0.083% nebulizer solution Take 3 mLs (2.5 mg total) by nebulization every 6 (six) hours as needed for wheezing or shortness of breath. 09/03/22   Hoy Register, MD  albuterol (VENTOLIN HFA) 108 (90 Base) MCG/ACT inhaler Inhale 1-2 puffs into the lungs every 6 (six) hours as needed for wheezing or shortness of breath. 03/06/22   Ellsworth Lennox, PA-C  allopurinol (ZYLOPRIM) 100 MG tablet Take 1 tablet (100 mg total) by mouth daily. 09/03/22   Hoy Register, MD  amLODipine (NORVASC) 10 MG tablet Take 1 tablet (10 mg total) by mouth daily. 09/03/22   Hoy Register, MD  atorvastatin (LIPITOR) 80 MG tablet Take 1 tablet (80 mg total) by mouth daily. 09/03/22   Hoy Register, MD  Blood Glucose Monitoring Suppl (TRUE METRIX METER) w/Device KIT Check blood sugar fasting and ant bedtime and record 05/15/17   Hoy Register, MD  carvedilol (COREG) 25 MG tablet Take 1 tablet (25 mg total) by mouth 2 (two) times daily with a meal. 09/03/22    Newlin, Enobong, MD  colchicine 0.6 MG tablet TAKE 1 TABLET BY MOUTH AS NEEDED TAKE 1 TABLET BY MOUTH AT THE ONSET OF A GOUT ATTACK WITH NEXT DOSE NO SOONER THAN 3 DAYS LATER 09/04/22   Hoy Register, MD  docusate sodium (COLACE) 100 MG capsule Take 1 capsule (100 mg total) by mouth 2 (two) times daily as needed for mild constipation. 05/14/22   Azucena Fallen, MD  furosemide (LASIX) 20 MG tablet Take 3 tablets (60 mg total) by mouth 2 (two) times daily. 09/03/22   Hoy Register, MD  gabapentin (NEURONTIN) 300 MG capsule Take 1 capsule (300 mg total) by mouth at bedtime. 08/05/22   Hoy Register, MD  glucose blood (TRUE METRIX BLOOD GLUCOSE TEST) test strip Use as instructed 12/27/19   Remo Lipps, MD  hydrALAZINE (APRESOLINE) 100 MG tablet Take 1 tablet (100 mg total) by mouth every 8 (eight) hours. 09/03/22   Hoy Register, MD  isosorbide mononitrate (IMDUR) 60 MG 24 hr tablet Take 1 tablet (60 mg total) by mouth daily. 09/03/22   Hoy Register, MD  Misc. Devices (DIGITAL GLASS SCALE) MISC Use scale to weight yourself. Increasing weight may indicate fluid overload 12/27/19   Remo Lipps, MD  pantoprazole (PROTONIX) 40 MG tablet Take 1 tablet (40 mg total) by mouth at bedtime. 09/03/22   Hoy Register, MD  TRUEplus Lancets 28G MISC Check blood sugar fasting and  at bedtime 12/27/19   Remo Lipps, MD      Allergies    Patient has no known allergies.    Review of Systems   Review of Systems  Musculoskeletal:  Positive for extremity weakness.    Physical Exam   Vitals:   10/16/22 1856 10/16/22 1900  BP:  (!) 147/99  Pulse:  72  Resp:  (!) 27  Temp: 99.2 F (37.3 C)   SpO2:  97%    CONSTITUTIONAL:  well-appearing, NAD NEURO:  GCS 15. Speech is goal oriented. No deficits appreciated to CN III-XII; symmetric eyebrow raise, no facial drooping, tongue midline. Patient has equal grip strength bilaterally with 5/5 strength against resistance in all major muscle groups bilaterally.  Sensation to light touch intact. Patient moves extremities without ataxia. Normal finger-nose-finger. Patient ambulatory with steady gait. EYES:  eyes equal and reactive ENT/NECK:  Supple, no stridor  CARDIO:  regular rhythm, appears well-perfused  PULM:  No respiratory distress, CTAB GI/GU:  non-distended, soft MSK/SPINE:  No gross deformities, no edema, moves all extremities  SKIN:  no rash, atraumatic  *Additional and/or pertinent findings included in MDM below   ED Results / Procedures / Treatments   Labs (all labs ordered are listed, but only abnormal results are displayed) Labs Reviewed  COMPREHENSIVE METABOLIC PANEL - Abnormal; Notable for the following components:      Result Value   CO2 19 (*)    Glucose, Bld 110 (*)    BUN 39 (*)    Creatinine, Ser 3.46 (*)    Calcium 7.8 (*)    Albumin 3.4 (*)    GFR, Estimated 20 (*)    All other components within normal limits  CBC WITH DIFFERENTIAL/PLATELET - Abnormal; Notable for the following components:   RBC 4.08 (*)    Hemoglobin 11.0 (*)    HCT 35.9 (*)    RDW 17.1 (*)    All other components within normal limits  PROTIME-INR - Abnormal; Notable for the following components:   Prothrombin Time 18.6 (*)    INR 1.5 (*)    All other components within normal limits  CBG MONITORING, ED - Abnormal; Notable for the following components:   Glucose-Capillary 107 (*)    All other components within normal limits  I-STAT CHEM 8, ED - Abnormal; Notable for the following components:   BUN 36 (*)    Creatinine, Ser 3.80 (*)    Glucose, Bld 107 (*)    Calcium, Ion 1.03 (*)    TCO2 18 (*)    Hemoglobin 11.9 (*)    HCT 35.0 (*)    All other components within normal limits  MAGNESIUM  APTT  ETHANOL    EKG EKG Interpretation Date/Time:  Wednesday October 16 2022 15:09:11 EDT Ventricular Rate:  71 PR Interval:  156 QRS Duration:  134 QT Interval:  434 QTC Calculation: 471 R Axis:   220  Text Interpretation: Sinus rhythm with  occasional Premature ventricular complexes Right bundle branch block Similar to prior Confirmed by Vivi Barrack [id number]) on 10/16/2022 7:27:17 PM  Radiology MR Brain Wo Contrast (neuro protocol)  Result Date: 10/16/2022 CLINICAL DATA:  Acute neurologic deficit EXAM: MRI HEAD WITHOUT CONTRAST TECHNIQUE: Multiplanar, multiecho pulse sequences of the brain and surrounding structures were obtained without intravenous contrast. COMPARISON:  05/07/2022 FINDINGS: Brain: Sequela of hemorrhage at the right thalamus. No acute infarct or acute hemorrhage. Numerous chronic microhemorrhages of the brainstem and cerebellum. There is multifocal hyperintense T2-weighted signal within the  white matter. Parenchymal volume and CSF spaces are normal. The midline structures are normal. Vascular: Major flow voids are preserved. Skull and upper cervical spine: Normal calvarium and skull base. Visualized upper cervical spine and soft tissues are normal. Sinuses/Orbits:No paranasal sinus fluid levels or advanced mucosal thickening. No mastoid or middle ear effusion. Normal orbits. IMPRESSION: 1. No acute intracranial abnormality. 2. Sequela of remote hemorrhage at the right thalamus. 3. Numerous chronic microhemorrhages of the brainstem and cerebellum. 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 Electronically Signed   By: Deatra  M.D.   On: 10/16/2022 20:58   CT Head Wo Contrast  Result Date: 10/16/2022 CLINICAL DATA:  Stroke follow-up EXAM: CT HEAD WITHOUT CONTRAST TECHNIQUE: Contiguous axial images were obtained from the base of the skull through the vertex without intravenous contrast. RADIATION DOSE REDUCTION: This exam was performed according to the departmental dose-optimization program which includes automated exposure control, adjustment of the mA and/or kV according to patient size and/or use of iterative reconstruction technique. COMPARISON:  CT head 10/16/2022 at 4:29 FINDINGS: Brain: Similar  trace hyperdense material in the right thalamus (series 2/15-16) in the area prior hematoma. No intracranial hemorrhage, mass effect, or evidence of acute infarct. No hydrocephalus. No extra-axial fluid collection. Generalized cerebral atrophy. Ill-defined hypoattenuation within the cerebral white matter is nonspecific but consistent with chronic small vessel ischemic disease. Vascular: No hyperdense vessel. Intracranial arterial calcification. Skull: No fracture or focal lesion. Sinuses/Orbits: No acute finding. Paranasal sinuses and mastoid air cells are well aerated. Other: None. IMPRESSION: Similar trace hyperdense material in the right thalamus, which could represent trace hemorrhage or mineralization. No new hemorrhage or mass effect. Electronically Signed   By: Minerva Fester M.D.   On: 10/16/2022 19:27   CT HEAD WO CONTRAST  Result Date: 10/16/2022 CLINICAL DATA:  Transient ischemic attack, recent stroke EXAM: CT HEAD WITHOUT CONTRAST TECHNIQUE: Contiguous axial images were obtained from the base of the skull through the vertex without intravenous contrast. RADIATION DOSE REDUCTION: This exam was performed according to the departmental dose-optimization program which includes automated exposure control, adjustment of the mA and/or kV according to patient size and/or use of iterative reconstruction technique. COMPARISON:  05/06/2022 FINDINGS: Brain: Trace hyperdense material in the right thalamus (series 3, image 15-16), at the site of the previously noted hematoma, which is no longer seen. Lacunar infarct the right posterior limb of the internal capsule. No evidence of acute infarct, mass, mass effect, or midline shift. No hydrocephalus or extra-axial fluid collection. Calcifications in the left basal ganglia. Vascular: No hyperdense vessel. Skull: Normal. Negative for fracture or focal lesion. Sinuses/Orbits: No acute finding. Other: The mastoid air cells are well aerated. IMPRESSION: Trace hyperdense  material in the right thalamus, at the site of the previously noted hematoma, which could represent mineralization related to the prior hemorrhage versus trace new hemorrhage. No additional acute intracranial process. These results were called by telephone at the time of interpretation on 10/16/2022 at 4:41 pm to provider [facility] , who verbally acknowledged these results. Electronically Signed   By: Wiliam Ke M.D.   On: 10/16/2022 16:42    Procedures Procedures    Medications Ordered in ED Medications  sodium chloride flush (NS) 0.9 % injection 3 mL (has no administration in time range)    ED Course/ Medical Decision Making/ A&P Clinical Course as of 10/16/22 2132  Wed Oct 16, 2022  1644 CT HEAD WO CONTRAST [AF]  2009 Discussed patient with Dr. Iver Nestle of neurology who  advised to proceed with MRI of the brain.  If no acute intracranial process can follow-up outpatient with neurology. [JR]    Clinical Course User Index [AF] Arabella Merles, PA-C [JR] Gareth Eagle, PA-C                             Medical Decision Making Amount and/or Complexity of Data Reviewed Radiology: ordered.   Initial Impression and Ddx 57 year old well-appearing male presenting for left-sided numbness and weakness.  Exam was reassuring overall with no focal neurodeficits.  DDx includes stroke, ICH, hypertensive emergency, electrolyte derangement, arrhythmia.  Patient PMH that increases complexity of ED encounter:  h/o ICH in February of this year, CKD, CHF, diabetes, hypertension and asthma   Interpretation of Diagnostics  I independent reviewed and interpreted the labs as followed: No acute derangements in comparison to baseline  - I independently visualized the following imaging with scope of interpretation limited to determining acute life threatening conditions related to emergency care: MRI brain with no acute abnormality  -I personally reviewed and interpreted EKG which revealed sinus  rhythm  Patient Reassessment and Ultimate Disposition/Management Was reassuring without focal neurodeficit but patient continued to endorse numbness and weakness of his left side.  Discussed patient with Dr. Iver Nestle of neurology who advised to proceed with MRI of the brain.  Stated that his symptoms have stayed the same and MRI reveals nothing acute he is appropriate to follow-up with neurology.  MRI without any acute abnormality.  Advised patient to follow-up with neurology.  Vital stable.  Discussed pertinent return precautions.  Discharged home in good condition.  Patient management required discussion with the following services or consulting groups:  Neurology  Complexity of Problems Addressed Acute complicated illness or Injury  Additional Data Reviewed and Analyzed Further history obtained from: Past medical history and medications listed in the EMR, Prior ED visit notes, and Recent discharge summary  Patient Encounter Risk Assessment None         Final Clinical Impression(s) / ED Diagnoses Final diagnoses:  Numbness  Weakness    Rx / DC Orders ED Discharge Orders     None         Gareth Eagle, PA-C 10/16/22 2136    Loetta Rough, MD 10/18/22 1157

## 2022-10-16 NOTE — Plan of Care (Signed)
Discussed with ED PA Riki Sheer These are curbside recommendations based upon the information readily available in the chart on brief review as well as history and examination information provided to me by requesting provider and do not replace a full detailed consult   I am asked to comment on this 57 year old man with a past medical history significant for right basal ganglia ICH in February 2024, CKD, CHF, diabetes, hypertension and asthma.  His initial symptoms in 2024 were numbness in the left side.  He just completed outpatient rehab yesterday.  He woke up this morning at 8 AM with recurrent numbness in the left arm and left leg.  Head CT is negative and I personally reviewed this but sensitivity is limited in the setting of his chronic microvascular disease and prior stroke  Labs are notable for an unexplained elevated INR which could be suggestive of liver dysfunction, his GFR at approximately 20 is stable from prior, mild uremia at 39 which is also within his baseline.  No clear metabolic derangement that would be causing recrudescence  Exam per ED PA is otherwise unremarkable with only the subjective sensory change  I do recommend obtaining MRI brain to rule out an acute intracranial process.  If this is reassuring, okay for outpatient follow-up. If positive, please do reach out for full recommendations  Brooke Dare MD-PhD [facility] [phone]

## 2022-10-16 NOTE — Discharge Instructions (Addendum)
Evaluation today was overall reassuring.  Please follow-up with [facility] neurology.  Contact information is in your discharge summary.  If you have worsening numbness or weakness, facial droop, slurred speech, changes in your gait or any other concerning symptom please return emergency department for further evaluation.

## 2022-10-16 NOTE — ED Triage Notes (Signed)
Pt reports that when he woke up at 0800 he had worsening L sided numbness and felt weaker on that side. Pt recently had stroke in February. Pt LKW 2200 yesterday. NIHSS 1, LVO neg during triage.

## 2022-10-17 NOTE — Telephone Encounter (Signed)
Pt has not yet returned Watch Pat One, attempted call no answer or answering machine.  Discussed with Craig Chamber NP patient was notified in April 2024 to return device within one week. Will notify billing to send invoice for device to patient. Jim Like MHA RN CCM

## 2022-10-18 ENCOUNTER — Ambulatory Visit: Payer: Medicaid Other | Admitting: Physical Therapy

## 2022-10-27 DIAGNOSIS — G8220 Paraplegia, unspecified: Secondary | ICD-10-CM | POA: Diagnosis not present

## 2022-10-27 DIAGNOSIS — R159 Full incontinence of feces: Secondary | ICD-10-CM | POA: Diagnosis not present

## 2022-10-27 DIAGNOSIS — I10 Essential (primary) hypertension: Secondary | ICD-10-CM | POA: Diagnosis not present

## 2022-11-06 ENCOUNTER — Ambulatory Visit: Payer: Self-pay

## 2022-11-06 NOTE — Telephone Encounter (Addendum)
The patient called in stating his blood pressure has been reading low lately with the last being 106/58. He normally runs high and is on bp medicine. He states he has no other symptoms. Please assist patient further    Chief Complaint: BP running low x 3 days. 106/58. States medications are now pre-packaged. No symptoms. Declines Mobile Unit."I can't go over there and wait because I have to go to the bathroom a lot." Asking to be worked in. Symptoms: No symptoms Frequency: 3 days ago Pertinent Negatives: Patient denies  Disposition: [] ED /[] Urgent Care (no appt availability in office) / [] Appointment(In office/virtual)/ []  [facility]/ [] Home Care/ [] Refused Recommended Disposition /[] [facility]/ [x]  Follow-up with PCP Additional Notes: Please advise pt.  Reason for Disposition  [1] Systolic BP 90-110 AND [2] taking blood pressure medications AND [3] NOT dizzy, lightheaded or weak  Answer Assessment - Initial Assessment Questions 1. BLOOD PRESSURE: "What is the blood pressure?" "Did you take at least two measurements 5 minutes apart?"     106/58 2. ONSET: "When did you take your blood pressure?"     3 days ago 3. HOW: "How did you obtain the blood pressure?" (e.g., visiting nurse, automatic home BP monitor)     Home cuff 4. HISTORY: "Do you have a history of low blood pressure?" "What is your blood pressure normally?"     No 5. MEDICINES: "Are you taking any medications for blood pressure?" If Yes, ask: "Have they been changed recently?"     Yes 6. PULSE RATE: "Do you know what your pulse rate is?"      No 7. OTHER SYMPTOMS: "Have you been sick recently?" "Have you had a recent injury?"     No 8. PREGNANCY: "Is there any chance you are pregnant?" "When was your last menstrual period?"     N/a  Protocols used: Blood Pressure - Low-A-AH

## 2022-11-06 NOTE — Telephone Encounter (Signed)
Spoke with patient . Verified name & DOB   Patient is refusing UC . Patient scheduled for 11/07/2022

## 2022-11-08 ENCOUNTER — Ambulatory Visit: Payer: Medicaid Other | Admitting: Critical Care Medicine

## 2022-11-08 NOTE — Progress Notes (Deleted)
   Established Patient Office Visit  Subjective   Patient ID: Craig West, male    DOB: 1966-01-04  Age: 57 y.o. MRN: [id number]  No chief complaint on file.   57 y.o.M PCP [place]  07/2022 Craig West Un is a 57 y.o. year old male with a history of hypertension, type 2 diabetes mellitus (A1c5.8, diet controlled), asthma, gout,stage IV chronic kidney disease,Hypertensive Heart Disease,Rectal carcinoma diagnosed in 12/2018 (completedchemotherapy andradiation), s/p Low anterior resection with diverting Colostomy at [facility] on 9/7/22with subsequent reversal of colostomyhere for a follow up visit.   Interval History: In 05/2022 he was hospitalized for hypertensive emergency and nontraumatic intracerebral hemorrhage after he had presented with left-sided weakness and CT scan revealed hemorrhagic stroke thought to be ischemic with conversion per notes. His hypertension is managed by the cardiology advanced hypertension clinic.  He still has residual left arm numbness, he also ambulates with a cane on some occasions. He is states he is on disability and gets a check every month. At the moment he is applying for low income housing and needs a letter stating he is disabled.  He has not been to see Nephrology in a while.  For his diabe Hypertensive heart disease with chronic diastolic congestive heart failure (HCC) EF of 55 to 60% Euvolemic   2. Hypertension in chronic kidney disease due to type 2 diabetes mellitus (HCC) Controlled Continue antihypertensive Advised to schedule follow-up appointment with his nephrologist Avoid nephrotoxins Counseled on blood pressure goal of less than 130/80, low-sodium, DASH diet, medication compliance, 150 minutes of moderate intensity exercise per week. Discussed medication compliance, adverse effects.     3. Diabetic polyneuropathy associated with type 2 diabetes mellitus (HCC) Diabetes is diet controlled with A1c of 5.7 Uncontrolled  neuropathy Will initiate gabapentin - gabapentin (NEURONTIN) 300 MG capsule; Take 1 capsule (300 mg total) by mouth at bedtime.  Dispense: 90 capsule; Refill: 1   4. Hemiparesis affecting left side as late effect of stroke (HCC) Secondary to uncontrolled hypertension and noncompliance which he has struggled with in the past. Secondary stroke prevention Continue statin He will benefit from PT Fall precautions Provided letter indicating his disability per request - Ambulatory referral to Physical Therapy     {History (Optional):23778}  ROS    Objective:     There were no vitals taken for this visit. {Vitals History (Optional):23777}  Physical Exam   No results found for any visits on 11/08/22.  {Labs (Optional):23779}  The ASCVD Risk score (Arnett DK, et al., 2019) failed to calculate for the following reasons:   The patient has a prior MI or stroke diagnosis    Assessment & Plan:   Problem List Items Addressed This Visit   None   No follow-ups on file.    Shan Levans, MD

## 2022-11-30 ENCOUNTER — Other Ambulatory Visit: Payer: Self-pay | Admitting: Family Medicine

## 2022-11-30 DIAGNOSIS — E1142 Type 2 diabetes mellitus with diabetic polyneuropathy: Secondary | ICD-10-CM

## 2022-12-03 NOTE — Telephone Encounter (Signed)
Requested medication (s) are due for refill today: no  Requested medication (s) are on the active medication list:yes  Last refill:  08/05/22 #90 1 RF   Future visit scheduled: no  Notes to clinic:  Cr out of normal range   Requested Prescriptions  Pending Prescriptions Disp Refills   gabapentin (NEURONTIN) 300 MG capsule [Pharmacy Med Name: GABAPENTIN 300 MG CAPS 300 Capsule] 30 capsule 10    Sig: TAKE 1 CAPSULE BY MOUTH AT BEDTIME     Neurology: Anticonvulsants - gabapentin Failed - 11/30/2022  6:49 PM      Failed - Cr in normal range and within 360 days    Creatinine  Date Value Ref Range Status  07/24/2022 3.94 (H) 0.61 - 1.24 mg/dL Final   Creat  Date Value Ref Range Status  10/23/2015 1.18 0.70 - 1.33 mg/dL Final    Comment:      For patients > or = 59 years of age: The upper reference limit for Creatinine is approximately 13% higher for people identified as African-American.      Creatinine, Ser  Date Value Ref Range Status  10/16/2022 3.46 (H) 0.61 - 1.24 mg/dL Final   Creatinine,U  Date Value Ref Range Status  09/19/2009 108.2 mg/dL Final    Comment:    See lab report for associated comment(s)   Creatinine, Urine  Date Value Ref Range Status  10/23/2015 348 20 - 370 mg/dL Final    Comment:    Result repeated and verified. Result confirmed by automatic dilution.          Passed - Completed PHQ-2 or PHQ-9 in the last 360 days      Passed - Valid encounter within last 12 months    Recent Outpatient Visits           4 months ago Hypertensive heart disease with chronic diastolic congestive heart failure (HCC)   [place] [facility] [place], Odette Horns, MD   1 year ago Type 2 diabetes mellitus with stage 4 chronic kidney disease, without long-term current use of insulin (HCC)   [place] [facility] [place], [place], MD   1 year ago Hypertension in chronic kidney disease due to type 2 diabetes mellitus [facility])    [place] [facility] [place], [place] L, RPH-CPP   1 year ago Type 2 diabetes mellitus with stage 4 chronic kidney disease, without long-term current use of insulin (HCC)   [place] [facility] Hoy Register, MD   1 year ago History of rectal cancer   [facility], [place], [place]

## 2022-12-06 ENCOUNTER — Other Ambulatory Visit: Payer: Self-pay | Admitting: Family Medicine

## 2022-12-06 DIAGNOSIS — E1142 Type 2 diabetes mellitus with diabetic polyneuropathy: Secondary | ICD-10-CM

## 2022-12-06 NOTE — Telephone Encounter (Signed)
Request is too soon, last refill 12/04/22. Duplicate request. Requested Prescriptions  Pending Prescriptions Disp Refills   gabapentin (NEURONTIN) 300 MG capsule [Pharmacy Med Name: GABAPENTIN 300 MG CAPS 300 Capsule] 30 capsule 10    Sig: TAKE 1 CAPSULE BY MOUTH AT BEDTIME     Neurology: Anticonvulsants - gabapentin Failed - 12/06/2022 10:22 AM      Failed - Cr in normal range and within 360 days    Creatinine  Date Value Ref Range Status  07/24/2022 3.94 (H) 0.61 - 1.24 mg/dL Final   Creat  Date Value Ref Range Status  10/23/2015 1.18 0.70 - 1.33 mg/dL Final    Comment:      For patients > or = 59 years of age: The upper reference limit for Creatinine is approximately 13% higher for people identified as African-American.      Creatinine, Ser  Date Value Ref Range Status  10/16/2022 3.46 (H) 0.61 - 1.24 mg/dL Final   Creatinine,U  Date Value Ref Range Status  09/19/2009 108.2 mg/dL Final    Comment:    See lab report for associated comment(s)   Creatinine, Urine  Date Value Ref Range Status  10/23/2015 348 20 - 370 mg/dL Final    Comment:    Result repeated and verified. Result confirmed by automatic dilution.          Passed - Completed PHQ-2 or PHQ-9 in the last 360 days      Passed - Valid encounter within last 12 months    Recent Outpatient Visits           4 months ago Hypertensive heart disease with chronic diastolic congestive heart failure (HCC)   [place] Mehan, Odette Horns, MD   1 year ago Type 2 diabetes mellitus with stage 4 chronic kidney disease, without long-term current use of insulin (HCC)   [facility] [place], [place], MD   1 year ago Hypertension in chronic kidney disease due to type 2 diabetes mellitus [facility])   [place] [facility] Odem, Ona L, RPH-CPP   1 year ago Type 2 diabetes mellitus with stage 4 chronic kidney disease, without  long-term current use of insulin (HCC)   [place] [facility] Hoy Register, MD   1 year ago History of rectal cancer   [facility], [place], [place]

## 2023-01-23 ENCOUNTER — Other Ambulatory Visit: Payer: Self-pay | Admitting: Family Medicine

## 2023-01-31 ENCOUNTER — Ambulatory Visit (HOSPITAL_BASED_OUTPATIENT_CLINIC_OR_DEPARTMENT_OTHER)
Admission: RE | Admit: 2023-01-31 | Discharge: 2023-01-31 | Disposition: A | Discharge status: Home / Self Care | Payer: Medicaid Other | Source: Ambulatory Visit | Attending: Nurse Practitioner | Admitting: Nurse Practitioner

## 2023-01-31 ENCOUNTER — Inpatient Hospital Stay: Payer: Medicaid Other | Attending: Oncology

## 2023-01-31 DIAGNOSIS — N400 Enlarged prostate without lower urinary tract symptoms: Secondary | ICD-10-CM | POA: Diagnosis not present

## 2023-01-31 DIAGNOSIS — Z85048 Personal history of other malignant neoplasm of rectum, rectosigmoid junction, and anus: Secondary | ICD-10-CM | POA: Diagnosis not present

## 2023-01-31 DIAGNOSIS — Z23 Encounter for immunization: Secondary | ICD-10-CM | POA: Insufficient documentation

## 2023-01-31 DIAGNOSIS — C20 Malignant neoplasm of rectum: Secondary | ICD-10-CM

## 2023-01-31 DIAGNOSIS — S2232XA Fracture of one rib, left side, initial encounter for closed fracture: Secondary | ICD-10-CM | POA: Diagnosis not present

## 2023-01-31 LAB — CEA (ACCESS): CEA (CHCC): 2.23 ng/mL (ref 0.00–5.00)

## 2023-02-04 ENCOUNTER — Inpatient Hospital Stay (HOSPITAL_BASED_OUTPATIENT_CLINIC_OR_DEPARTMENT_OTHER): Payer: Medicaid Other | Admitting: Oncology

## 2023-02-04 ENCOUNTER — Inpatient Hospital Stay: Payer: Medicaid Other

## 2023-02-04 ENCOUNTER — Telehealth: Payer: Self-pay

## 2023-02-04 ENCOUNTER — Other Ambulatory Visit: Payer: Self-pay

## 2023-02-04 VITALS — BP 132/90 | HR 71 | Temp 98.1°F | Resp 18 | Ht 73.0 in | Wt 195.2 lb

## 2023-02-04 DIAGNOSIS — Z23 Encounter for immunization: Secondary | ICD-10-CM | POA: Diagnosis not present

## 2023-02-04 DIAGNOSIS — C20 Malignant neoplasm of rectum: Secondary | ICD-10-CM

## 2023-02-04 MED ORDER — INFLUENZA VIRUS VACC SPLIT PF (FLUZONE) 0.5 ML IM SUSY: 0.5000 mL | PREFILLED_SYRINGE | Freq: Once | INTRAMUSCULAR | Status: DC

## 2023-02-04 MED ORDER — INFLUENZA VIRUS VACC SPLIT PF (FLUZONE) 0.5 ML IM SUSY
0.5000 mL | PREFILLED_SYRINGE | Freq: Once | INTRAMUSCULAR | Status: AC
  Administered 2023-02-04: 0.5 mL via INTRAMUSCULAR
  Filled 2023-02-04: qty 0.5

## 2023-02-04 NOTE — Progress Notes (Signed)
[facility] OFFICE PROGRESS NOTE   Diagnosis: Rectal cancer  INTERVAL HISTORY:   Craig. Allman returns as scheduled.  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  He has numbness at the left side of the face and altered taste.  He has a good appetite.  He fell approximately 1 month ago and injured the left chest wall. He has irregular bowel habits.  He reports rectal bleeding when he was in the hospital in February.  No recent bleeding.  There is drainage from the inferior aspect of the surgical scar.  Objective:  Vital signs in last 24 hours:  Blood pressure (!) 132/90, pulse 71, temperature 98.1 F (36.7 C), temperature source Temporal, resp. rate 18, height 6\' 1"  (1.854 m), weight 195 lb 3.2 oz (88.5 kg), SpO2 100%.   Lymphatics: No cervical, supraclavicular, axillary, or inguinal nodes Resp: Lungs clear bilaterally Cardio: Regular rate and rhythm GI: No hepatosplenomegaly, no mass, nontender.  3-4 mm moist area at the inferior aspect of the midline scar-fistula? Vascular: No leg edema Neuro: Alert and oriented, the motor exam appears intact in the arms bilaterally    Lab Results:  Lab Results  Component Value Date   WBC 9.7 10/16/2022   HGB 11.0 (L) 10/16/2022   HCT 35.9 (L) 10/16/2022   MCV 88.0 10/16/2022   PLT 211 10/16/2022   NEUTROABS 7.6 10/16/2022    CMP  Lab Results  Component Value Date   NA 138 10/16/2022   K 3.9 10/16/2022   CL 109 10/16/2022   CO2 19 (L) 10/16/2022   GLUCOSE 110 (H) 10/16/2022   BUN 39 (H) 10/16/2022   CREATININE 3.46 (H) 10/16/2022   CALCIUM 7.8 (L) 10/16/2022   PROT 7.3 10/16/2022   ALBUMIN 3.4 (L) 10/16/2022   AST 17 10/16/2022   ALT 11 10/16/2022   ALKPHOS 109 10/16/2022   BILITOT 0.5 10/16/2022   GFRNONAA 20 (L) 10/16/2022   GFRAA 15 (L) 01/03/2020    Lab Results  Component Value Date   CEA1 4.40 10/10/2020   CEA 2.23 01/31/2023     Medications: I have reviewed the patient's  current medications.   Assessment/Plan: Rectal cancer-rectal mass at 5-6 cm from the anal verge on colonoscopy 01/11/2020, biopsy confirmed adenocarcinoma CTs 01/11/2020-rectal thickening, small perirectal lymph nodes and external iliac nodes, moderate right pleural effusion MRI pelvis 01/27/2020-T3bN2b tumor at 7.1 cm from the internal anal sphincter, multiple mesorectal nodes, indistinct left pelvic sidewall node and enlarged bilateral external iliac nodes PET scan 02/11/2020-hypermetabolic rectosigmoid mass, hypermetabolic mesorectal and sigmoid mesocolon lymph nodes, left external iliac/obturator node with mild hypermetabolism, 18 mm left inguinal node with normal architecture and slight hypermetabolism Radiation and infusional 5-FU 02/21/2020-04/04/2020 CTs 05/09/2020-decreased perirectal and sigmoid mesocolon lymphadenopathy, no evidence of disease progression MRI pelvis 05/17/2020-no well-defined residual rectal mass seen.  Wall appears slightly irregular.  No perirectal extension of tumor.  Again identified is perirectal adenopathy, similar to most recent CT 05/10/2020 but improved compared to PET/CT of 02/11/2020. Cycle 1 FOLFOX 06/14/2020 06/27/2020 chemotherapy held due to hypertension Cycle 2 FOLFOX 06/29/2020 Cycle 3 FOLFOX 07/13/2020 Cycle 4 FOLFOX 07/27/2020 Cycle 5 FOLFOX 08/09/2020 Cycle 6 FOLFOX 08/24/2020 Cycle 7 FOLFOX 09/07/2020 Cycle 8 FOLFOX 09/21/2020 MRI pelvis 10/16/2020-no residual rectal mass, small presacral/perirectal nodes improved from previous MRI 12/06/2020 open low anterior resection with diverting loop colostomy, Dr. Luciano Cutter; pathology-rectal tumor location entirely below anterior peritoneal reflection; adenocarcinoma; minimal residual tumor; invades submucosa; no macroscopic tumor perforation; no lymphovascular invasion; no  perineural invasion; treatment effect present with single cells or rare small groups of cancer cells (near complete response, score 1); all margins negative  for invasive carcinoma; 2 of 13 lymph nodes positive; pT1, pN1b 04/04/2021 transverse loop colostomy takedown CT chest 05/04/2021 (CT abdomen/pelvis October 2022)-no evidence of metastatic disease CTs 02/03/2022-no findings highly suspicious for recurrent metastatic disease.  New mild mediastinal lymphadenopathy is nonspecific.  Nonspecific mild right external iliac lymphadenopathy is unchanged. CT chest 07/24/2022-resolved mediastinal adenopathy. CTs 01/31/2023-no evidence of recurrent disease, stable chronic upper presacral gas containing collection, acute left lateral eighth rib fracture, stable hyperdense right renal lesions Anemia Chronic renal failure Congestive heart failure-severe LVH Diabetes Gout Peripheral neuropathy? Hypertension Fever/chills 01/03/2021-CT with air-fluid collection in the presacral space extending from the level of the anus to the level of S2 worrisome for abscess or contained perforation.  Transferred to [facility].  Drain placed.  Culture positive for E. coli.  Drain removed 01/07/2021.  Repeat CT 01/08/2021 showed near resolution of the fluid component of presacral collection with persistent thick soft tissue rind not amenable to drainage.  Drain was not replaced. 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  I reviewed the CT findings with him.  The left rib fracture is likely related to the recent fall.  He has lost weight since suffering a brain hemorrhage earlier this year.  We made a nutrition referral.  He reports drainage from the midline scar and there is a moist area at the inferior aspect of the scar today.  He may have an abscess or fistula tract.  We will request an appointment with Dr.Mantyh.   He will return for an office visit and CEA in 6 months.  We referred Craig. Torbert Dr. Marina Goodell.  He is due for colonoscopy surveillance.   Thornton Papas, MD  02/04/2023  2:31  PM

## 2023-02-04 NOTE — Telephone Encounter (Signed)
Called patient back to make aware of appointment change for Dr. Francetta Found at [facility]. Patient stated he was driving and could not write down information. Made aware that a mychart message was send with appointment change, phone number. Understood.

## 2023-02-12 ENCOUNTER — Inpatient Hospital Stay: Payer: Medicaid Other | Admitting: Nutrition

## 2023-02-12 ENCOUNTER — Telehealth: Payer: Self-pay

## 2023-02-12 NOTE — Telephone Encounter (Signed)
[facility] Clinical Social Work  Clinical Social Work was referred by  dietician  for assessment of psychosocial needs.  Clinical Social Worker attempted to contact patient by phone to offer support and assess for needs.  No answer, left vm with direct contact information.  Marguerita Merles, LCSWA Clinical Social Worker [facility]

## 2023-02-12 NOTE — Progress Notes (Signed)
Reason for Referral: Weight loss.  Nutrition follow up competed with patient who is in clinical remission from Rectal cancer and followed by Dr. Truett Perna.  PMH includes CVA in Feb 24, anemia, CRF, CHF, DM (diet controlled), Gout, HTN.  Medications include Colace, Lasix, and Protonix.  Labs noted: Glucose 110.  Height: 6'1". Weight: 195 pounds 3.2 oz. UBW: 214.8 pounds in May, 2024. Patient weighed 225 pounds in March 2023. BMI: 25.75. NFPE declined  Estimated Nutrition Needs: 2400-2600 kcal, 105-120 gm protein, >2.4 L fluid  Patient remains in clinical remission from Rectal cancer. Reports he does not have difficulty chewing or swallowing and could eat well after stroke in February. He is "very active". Noted he walks slowly into appointment but does not need a wheelchair as he did previously. Reports numbness on left side of his body continues. It has "moved down" his face to his cheek and jaw area. Denies taste alterations but reports various food textures result in him spitting food out or stopping to eat after a few bites. This limits patient's ability to increase food intake. He cannot determine any one food or beverage that results in this routinely. States he tolerated Ensure Plus in the past. Reports food insecurity and states he is not eligible for food stamps. He does not follow any diet restrictions for DM or renal disease.  Nutrition Diagnosis: Unintended wt loss related to decreased oral intake as evidenced by 9% wt loss over 6 months and 13% loss from UBW of 225 pounds.  Intervention: Consider ordering swallowing evaluation to rule out dysphagia. Recommend patient drink Ensure Plus or equivalent TID between meals.  Educated on ways to increase calories and protein.  Educated on low cost, high nutrient dense food options. ONS samples provided. Referral made to SW to investigate financial assistance.  Monitoring, Evaluation, Goals: Increase calories and protein to minimize  further weight loss.

## 2023-02-13 ENCOUNTER — Encounter: Payer: Self-pay | Admitting: *Deleted

## 2023-02-13 NOTE — Progress Notes (Addendum)
Faxed order for Ensure Plus or equivalent po tid; #90; RF x 5 to [facility] with last office note, dietician note and facesheet.  Fax #[phone] 02/14/23: Call from [place] w/[facility]. Only able to use [employer] products for oral supplements. Need verbal OK for Boost Plus. Approval was given.

## 2023-02-19 ENCOUNTER — Encounter: Payer: Self-pay | Admitting: Oncology

## 2023-02-19 DIAGNOSIS — C20 Malignant neoplasm of rectum: Secondary | ICD-10-CM | POA: Diagnosis not present

## 2023-02-19 DIAGNOSIS — H25812 Combined forms of age-related cataract, left eye: Secondary | ICD-10-CM | POA: Diagnosis not present

## 2023-02-19 DIAGNOSIS — I10 Essential (primary) hypertension: Secondary | ICD-10-CM | POA: Diagnosis not present

## 2023-02-19 DIAGNOSIS — R634 Abnormal weight loss: Secondary | ICD-10-CM | POA: Diagnosis not present

## 2023-02-19 NOTE — Telephone Encounter (Signed)
Telephone call  

## 2023-02-21 ENCOUNTER — Other Ambulatory Visit: Payer: Self-pay | Admitting: Family Medicine

## 2023-02-21 DIAGNOSIS — I110 Hypertensive heart disease with heart failure: Secondary | ICD-10-CM

## 2023-03-11 DIAGNOSIS — Z9221 Personal history of antineoplastic chemotherapy: Secondary | ICD-10-CM | POA: Diagnosis not present

## 2023-03-11 DIAGNOSIS — C20 Malignant neoplasm of rectum: Secondary | ICD-10-CM | POA: Diagnosis not present

## 2023-03-11 DIAGNOSIS — Z08 Encounter for follow-up examination after completed treatment for malignant neoplasm: Secondary | ICD-10-CM | POA: Diagnosis not present

## 2023-03-11 DIAGNOSIS — Z923 Personal history of irradiation: Secondary | ICD-10-CM | POA: Diagnosis not present

## 2023-03-11 DIAGNOSIS — Z85048 Personal history of other malignant neoplasm of rectum, rectosigmoid junction, and anus: Secondary | ICD-10-CM | POA: Diagnosis not present

## 2023-03-17 DIAGNOSIS — G4733 Obstructive sleep apnea (adult) (pediatric): Secondary | ICD-10-CM | POA: Diagnosis not present

## 2023-03-17 DIAGNOSIS — H25811 Combined forms of age-related cataract, right eye: Secondary | ICD-10-CM | POA: Diagnosis not present

## 2023-03-25 ENCOUNTER — Other Ambulatory Visit: Payer: Self-pay | Admitting: Family Medicine

## 2023-03-25 DIAGNOSIS — I110 Hypertensive heart disease with heart failure: Secondary | ICD-10-CM

## 2023-04-17 ENCOUNTER — Telehealth (HOSPITAL_COMMUNITY): Payer: Self-pay

## 2023-04-17 NOTE — Telephone Encounter (Signed)
Informed by the front desk staff that pharmacy called for this patient in regards to medications and refills. Patient last seen at the urgent care 10/16/2022.   Calling the pharmacy to receive clarification.

## 2023-04-17 NOTE — Telephone Encounter (Signed)
No answer when calling the pharmacy, left detailed voicemail on a secure line.

## 2023-04-18 ENCOUNTER — Encounter: Payer: Self-pay | Admitting: Oncology

## 2023-04-24 DIAGNOSIS — R634 Abnormal weight loss: Secondary | ICD-10-CM | POA: Diagnosis not present

## 2023-04-24 DIAGNOSIS — C20 Malignant neoplasm of rectum: Secondary | ICD-10-CM | POA: Diagnosis not present

## 2023-05-20 ENCOUNTER — Ambulatory Visit: Payer: Medicaid Other | Attending: Family Medicine | Admitting: Family Medicine

## 2023-05-20 ENCOUNTER — Encounter: Payer: Self-pay | Admitting: Family Medicine

## 2023-05-20 VITALS — BP 152/89 | HR 65 | Ht 73.0 in | Wt 190.0 lb

## 2023-05-20 DIAGNOSIS — Z85048 Personal history of other malignant neoplasm of rectum, rectosigmoid junction, and anus: Secondary | ICD-10-CM | POA: Diagnosis not present

## 2023-05-20 DIAGNOSIS — I5032 Chronic diastolic (congestive) heart failure: Secondary | ICD-10-CM | POA: Diagnosis not present

## 2023-05-20 DIAGNOSIS — N184 Chronic kidney disease, stage 4 (severe): Secondary | ICD-10-CM | POA: Diagnosis not present

## 2023-05-20 DIAGNOSIS — E1122 Type 2 diabetes mellitus with diabetic chronic kidney disease: Secondary | ICD-10-CM

## 2023-05-20 DIAGNOSIS — E1142 Type 2 diabetes mellitus with diabetic polyneuropathy: Secondary | ICD-10-CM | POA: Diagnosis not present

## 2023-05-20 DIAGNOSIS — Z23 Encounter for immunization: Secondary | ICD-10-CM | POA: Diagnosis not present

## 2023-05-20 DIAGNOSIS — I110 Hypertensive heart disease with heart failure: Secondary | ICD-10-CM | POA: Diagnosis not present

## 2023-05-20 DIAGNOSIS — M1A00X0 Idiopathic chronic gout, unspecified site, without tophus (tophi): Secondary | ICD-10-CM

## 2023-05-20 LAB — POCT GLYCOSYLATED HEMOGLOBIN (HGB A1C): HbA1c, POC (controlled diabetic range): 5.3 % (ref 0.0–7.0)

## 2023-05-20 MED ORDER — AMLODIPINE BESYLATE 10 MG PO TABS: 10.0000 mg | ORAL_TABLET | Freq: Every day | ORAL | 3 refills | Status: DC

## 2023-05-20 MED ORDER — ATORVASTATIN CALCIUM 80 MG PO TABS
80.0000 mg | ORAL_TABLET | Freq: Every day | ORAL | 3 refills | Status: AC
Start: 2023-05-20 — End: ?

## 2023-05-20 MED ORDER — SPIRONOLACTONE 25 MG PO TABS: 25.0000 mg | ORAL_TABLET | Freq: Every day | ORAL | 3 refills | Status: DC

## 2023-05-20 MED ORDER — FUROSEMIDE 20 MG PO TABS
20.0000 mg | ORAL_TABLET | Freq: Every day | ORAL | 1 refills | Status: DC
Start: 2023-05-20 — End: 2023-11-25

## 2023-05-20 MED ORDER — HYDRALAZINE HCL 100 MG PO TABS: 100.0000 mg | ORAL_TABLET | Freq: Three times a day (TID) | ORAL | 0 refills | Status: DC

## 2023-05-20 MED ORDER — CARVEDILOL 25 MG PO TABS
25.0000 mg | ORAL_TABLET | Freq: Two times a day (BID) | ORAL | 3 refills | Status: DC
Start: 2023-05-20 — End: 2023-11-25

## 2023-05-20 MED ORDER — GABAPENTIN 300 MG PO CAPS
300.0000 mg | ORAL_CAPSULE | Freq: Every day | ORAL | 10 refills | Status: DC
Start: 2023-05-20 — End: 2023-11-25

## 2023-05-20 MED ORDER — COLCHICINE 0.6 MG PO TABS: ORAL_TABLET | ORAL | 10 refills | Status: DC

## 2023-05-20 MED ORDER — ALLOPURINOL 100 MG PO TABS
100.0000 mg | ORAL_TABLET | Freq: Every day | ORAL | 1 refills | Status: DC
Start: 2023-05-20 — End: 2023-11-25

## 2023-05-20 MED ORDER — ISOSORBIDE MONONITRATE ER 60 MG PO TB24: 60.0000 mg | ORAL_TABLET | Freq: Every day | ORAL | 1 refills | Status: DC

## 2023-05-20 NOTE — Progress Notes (Signed)
Subjective:  Patient ID: Craig West, male    DOB: 1965/12/08  Age: 58 y.o. MRN: [id number]  CC: Medical Management of Chronic Issues (Medication refills)   HPI Craig West is a 58 y.o. year old male with a history of hypertension, type 2 diabetes mellitus (A1c 5.3, diet controlled), asthma, gout, stage IV chronic kidney disease, Hypertensive Heart Disease, Rectal carcinoma diagnosed in 12/2018 (completed chemotherapy and radiation), s/p Low anterior resection with diverting Colostomy at [facility] on 12/06/20 with subsequent reversal of colostomy, intracerebral hemorrhage with slight left-sided residual numbness.  Here for a follow up visit.   Interval History: Discussed the use of AI scribe software for clinical note transcription with the patient, who gave verbal consent to proceed.  His blood pressure is high despite adherence to his medication regimen. He denies recent gout flares and has medication on hand for symptom management.  The patient has been experiencing intermittent weakness, stating "some days I couldn't stand up really." On these days, he rests at home. He also reports significant weight loss, approximately 25-30 pounds, and decreased appetite. He has been using nutritional supplements to help maintain his weight. He attributes some of his appetite loss to numbness on the left side of his mouth, a residual effect of his stroke, which affects his sense of taste.  The patient lives alone and prepares his own meals. He is not currently interested in home care services. He has been seen by a [facility] GI specialist and recent CT scans show no evidence of cancer. He has not been following up with his kidney specialist for his stage 4 kidney disease diagnosis.   His diabetes is diet controlled with an A1c of 5.3. He requests a handicap form due to the fact that he has difficulty ambulating ever since his stroke.    Past Medical History:  Diagnosis Date   Asthma    CHF  (congestive heart failure) (HCC)    CKD (chronic kidney disease), stage IV (HCC)    COVID-19 virus infection 04/2019   Diabetes mellitus without complication (HCC)    Fatigue 11/23/2020   Gout    Hypertension    Leg heaviness 11/23/2020   NSVT (nonsustained ventricular tachycardia) (HCC) 11/23/2020   Rectal cancer [facility])     Past Surgical History:  Procedure Laterality Date   BIOPSY  01/11/2020   Procedure: BIOPSY;  Surgeon: Craig Fredrickson, MD;  Location: [facility] ENDOSCOPY;  Service: Endoscopy;;   COLONOSCOPY WITH PROPOFOL N/A 01/11/2020   Procedure: COLONOSCOPY WITH PROPOFOL;  Surgeon: Craig Fredrickson, MD;  Location: [facility] ENDOSCOPY;  Service: Endoscopy;  Laterality: N/A;   NO PAST SURGERIES     POLYPECTOMY  01/11/2020   Procedure: POLYPECTOMY;  Surgeon: Craig Fredrickson, MD;  Location: [facility] ENDOSCOPY;  Service: Endoscopy;;   SUBMUCOSAL TATTOO INJECTION  01/11/2020   Procedure: SUBMUCOSAL TATTOO INJECTION;  Surgeon: Craig Fredrickson, MD;  Location: [facility] ENDOSCOPY;  Service: Endoscopy;;    Family History  Problem Relation Age of Onset   Heart failure Brother     Social History   Socioeconomic History   Marital status: Single    Spouse name: Not on file   Number of children: Not on file   Years of education: Not on file   Highest education level: Not on file  Occupational History   Not on file  Tobacco Use   Smoking status: Former    Current packs/day: 0.00    Types: Cigarettes    Quit date: 10/27/2019  Years since quitting: 3.5   Smokeless tobacco: Never  Vaping Use   Vaping status: Never Used  Substance and Sexual Activity   Alcohol use: Yes    Alcohol/week: 0.0 standard drinks of alcohol    Comment: occ   Drug use: Yes    Types: Marijuana   Sexual activity: Not Currently  Other Topics Concern   Not on file  Social History Narrative   Not on file   Social Drivers of Health   Financial Resource Strain: High Risk (06/12/2021)   Overall Financial Resource Strain (CARDIA)     Difficulty of Paying Living Expenses: Hard  Food Insecurity: No Food Insecurity (05/09/2022)   Hunger Vital Sign    Worried About Running Out of Food in the Last Year: Never true    Ran Out of Food in the Last Year: Never true  Transportation Needs: No Transportation Needs (05/09/2022)   PRAPARE - Administrator, Civil Service (Medical): No    Lack of Transportation (Non-Medical): No  Physical Activity: Inactive (06/12/2021)   Exercise Vital Sign    Days of Exercise per Week: 0 days    Minutes of Exercise per Session: 0 min  Stress: Stress Concern Present (06/12/2021)   [employer] of Occupational Health - Occupational Stress Questionnaire    Feeling of Stress : Rather much  Social Connections: Socially Isolated (06/12/2021)   Social Connection and Isolation Panel [NHANES]    Frequency of Communication with Friends and Family: Twice a week    Frequency of Social Gatherings with Friends and Family: Twice a week    Attends Religious Services: Never    Database administrator or Organizations: No    Attends Engineer, structural: Never    Marital Status: Divorced    No Known Allergies  Outpatient Medications Prior to Visit  Medication Sig Dispense Refill   docusate sodium (COLACE) 100 MG capsule Take 1 capsule (100 mg total) by mouth 2 (two) times daily as needed for mild constipation. 10 capsule 0   pantoprazole (PROTONIX) 40 MG tablet Take 1 tablet (40 mg total) by mouth at bedtime. 90 tablet 1   allopurinol (ZYLOPRIM) 100 MG tablet Take 1 tablet (100 mg total) by mouth daily. 90 tablet 1   amLODipine (NORVASC) 10 MG tablet Take 1 tablet (10 mg total) by mouth daily. 90 tablet 3   atorvastatin (LIPITOR) 80 MG tablet Take 1 tablet (80 mg total) by mouth daily. 90 tablet 3   carvedilol (COREG) 25 MG tablet Take 1 tablet (25 mg total) by mouth 2 (two) times daily with a meal. 180 tablet 3   colchicine 0.6 MG tablet TAKE 1 TABLET BY MOUTH AT ONSET OF A GOUT ATTACK AS  NEEDED WITH NEXT DOSE NO SOONER THAN 3 DAYS LATER 30 tablet 10   furosemide (LASIX) 20 MG tablet TAKE THREE (3) TABLETS BY MOUTH TWICE DAILY 180 tablet 0   gabapentin (NEURONTIN) 300 MG capsule TAKE 1 CAPSULE BY MOUTH AT BEDTIME 30 capsule 10   hydrALAZINE (APRESOLINE) 100 MG tablet TAKE 1 TABLET BY MOUTH EVERY 8 HOURS 90 tablet 0   isosorbide mononitrate (IMDUR) 60 MG 24 hr tablet TAKE 1 TABLET BY MOUTH DAILY 30 tablet 0   albuterol (PROVENTIL) (2.5 MG/3ML) 0.083% nebulizer solution Take 3 mLs (2.5 mg total) by nebulization every 6 (six) hours as needed for wheezing or shortness of breath. (Patient not taking: Reported on 05/20/2023) 360 mL 2   albuterol (VENTOLIN HFA) 108 (90  Base) MCG/ACT inhaler Inhale 1-2 puffs into the lungs every 6 (six) hours as needed for wheezing or shortness of breath. (Patient not taking: Reported on 02/04/2023) 18 g 2   Blood Glucose Monitoring Suppl (TRUE METRIX METER) w/Device KIT Check blood sugar fasting and ant bedtime and record (Patient not taking: Reported on 02/04/2023) 1 kit 0   glucose blood (TRUE METRIX BLOOD GLUCOSE TEST) test strip Use as instructed (Patient not taking: Reported on 02/04/2023) 200 each 12   Misc. Devices (DIGITAL GLASS SCALE) MISC Use scale to weight yourself. Increasing weight may indicate fluid overload (Patient not taking: Reported on 02/04/2023) 1 each 0   TRUEplus Lancets 28G MISC Check blood sugar fasting and at bedtime (Patient not taking: Reported on 02/04/2023) 210 each 2   No facility-administered medications prior to visit.     ROS Review of Systems  Constitutional:  Negative for activity change and appetite change.  HENT:  Negative for sinus pressure and sore throat.   Respiratory:  Negative for chest tightness, shortness of breath and wheezing.   Cardiovascular:  Negative for chest pain and palpitations.  Gastrointestinal:  Negative for abdominal distention, abdominal pain and constipation.  Genitourinary: Negative.    Musculoskeletal: Negative.   Neurological:  Positive for weakness and numbness.  Psychiatric/Behavioral:  Negative for behavioral problems and dysphoric mood.     Objective:  BP (!) 152/89   Pulse 65   Ht 6\' 1"  (1.854 m)   Wt 190 lb (86.2 kg)   SpO2 99%   BMI 25.07 kg/m      05/20/2023    3:41 PM 05/20/2023    2:52 PM 02/04/2023    8:17 AM  BP/Weight  Systolic BP 152 161 132  Diastolic BP 89 86 90  Wt. (Lbs)  190   BMI  25.07 kg/m2       Physical Exam Constitutional:      Appearance: He is well-developed.  Cardiovascular:     Rate and Rhythm: Normal rate.     Heart sounds: Normal heart sounds. No murmur heard. Pulmonary:     Effort: Pulmonary effort is normal.     Breath sounds: Normal breath sounds. No wheezing or rales.  Chest:     Chest wall: No tenderness.  Abdominal:     General: Bowel sounds are normal. There is no distension.     Palpations: Abdomen is soft. There is no mass.     Tenderness: There is no abdominal tenderness.  Musculoskeletal:        General: Normal range of motion.     Right lower leg: No edema.     Left lower leg: No edema.  Neurological:     Mental Status: He is alert and oriented to person, place, and time.     Motor: Weakness present.     Gait: Gait abnormal.  Psychiatric:        Mood and Affect: Mood normal.        Latest Ref Rng & Units 10/16/2022    4:19 PM 10/16/2022    4:13 PM 07/24/2022   10:15 AM  CMP  Glucose 70 - 99 mg/dL 161  096  045   BUN 6 - 20 mg/dL 39  36  69   Creatinine 0.61 - 1.24 mg/dL 4.09  8.11  9.14   Sodium 135 - 145 mmol/L 138  140  139   Potassium 3.5 - 5.1 mmol/L 3.9  3.9  4.0   Chloride 98 - 111 mmol/L 109  109  102   CO2 22 - 32 mmol/L 19   25   Calcium 8.9 - 10.3 mg/dL 7.8   8.8   Total Protein 6.5 - 8.1 g/dL 7.3   7.8   Total Bilirubin 0.3 - 1.2 mg/dL 0.5   0.7   Alkaline Phos 38 - 126 U/L 109   110   AST 15 - 41 U/L 17   14   ALT 0 - 44 U/L 11   8     Lipid Panel     Component Value  Date/Time   CHOL 95 05/05/2022 2244   CHOL 140 06/27/2021 0927   TRIG 131 05/05/2022 2244   HDL 35 (L) 05/05/2022 2244   HDL 29 (L) 06/27/2021 0927   CHOLHDL 2.7 05/05/2022 2244   VLDL 26 05/05/2022 2244   LDLCALC 34 05/05/2022 2244   LDLCALC 81 06/27/2021 0927    CBC    Component Value Date/Time   WBC 9.7 10/16/2022 1619   RBC 4.08 (L) 10/16/2022 1619   HGB 11.0 (L) 10/16/2022 1619   HGB 11.1 (L) 06/27/2021 0927   HCT 35.9 (L) 10/16/2022 1619   HCT 34.5 (L) 06/27/2021 0927   PLT 211 10/16/2022 1619   PLT 236 06/27/2021 0927   MCV 88.0 10/16/2022 1619   MCV 82 06/27/2021 0927   MCH 27.0 10/16/2022 1619   MCHC 30.6 10/16/2022 1619   RDW 17.1 (H) 10/16/2022 1619   RDW 16.6 (H) 06/27/2021 0927   LYMPHSABS 1.0 10/16/2022 1619   LYMPHSABS 1.0 06/27/2021 0927   MONOABS 0.8 10/16/2022 1619   EOSABS 0.2 10/16/2022 1619   EOSABS 0.3 06/27/2021 0927   BASOSABS 0.1 10/16/2022 1619   BASOSABS 0.1 06/27/2021 0865    Lab Results  Component Value Date   HGBA1C 5.3 05/20/2023    Assessment & Plan:      Hypertensive heart disease EF of 55 to 60% from 05/2022 Elevated blood pressure despite reported adherence to antihypertensive medication. -Add Spironolactone to medication regimen. -Check potassium level in one month due to risk of hyperkalemia with Spironolactone.  Gout No recent flares reported. -Continue current gout medication.  Chronic Kidney Disease (Stage 4) Patient has not been following up with nephrology. -Order blood tests today to assess kidney function. -Provide patient with contact information for [facility] for follow-up.  Post-Stroke Sequelae Reports left-sided numbness and weakness, impacting mobility and taste. -Complete and provide handicap form for patient due to mobility limitations.  History of rectal cancer -No evidence of active cancer at this time.  Oncology notes -unintentional Weight Loss Reports significant weight loss and  decreased appetite, possibly related to post-stroke taste changes. -Continue with nutritional supplementation (Ensure).  Pneumonia Vaccination Patient is due for vaccination. -Administer pneumonia vaccine today.  Type II diabetes melitis -With diabetic neuropathy Well-controlled with A1c of 5.3 without medication. -Continue current management.  Follow-up -Return visit in six months. -Continue follow-up with oncologist. -Obtain records from recent cataract surgeries performed by Dr. Clelia Croft at [facility].          Meds ordered this encounter  Medications   allopurinol (ZYLOPRIM) 100 MG tablet    Sig: Take 1 tablet (100 mg total) by mouth daily.    Dispense:  90 tablet    Refill:  1   amLODipine (NORVASC) 10 MG tablet    Sig: Take 1 tablet (10 mg total) by mouth daily.    Dispense:  90 tablet    Refill:  3  atorvastatin (LIPITOR) 80 MG tablet    Sig: Take 1 tablet (80 mg total) by mouth daily.    Dispense:  90 tablet    Refill:  3    NEW DOSE, D/C 40 MG RX   carvedilol (COREG) 25 MG tablet    Sig: Take 1 tablet (25 mg total) by mouth 2 (two) times daily with a meal.    Dispense:  180 tablet    Refill:  3    New dose, d/c 12.5 mg rx   colchicine 0.6 MG tablet    Sig: TAKE 1 TABLET BY MOUTH AT ONSET OF A GOUT ATTACK AS NEEDED WITH NEXT DOSE NO SOONER THAN 3 DAYS LATER    Dispense:  30 tablet    Refill:  10   furosemide (LASIX) 20 MG tablet    Sig: Take 1 tablet (20 mg total) by mouth daily.    Dispense:  180 tablet    Refill:  1    Must have office visit for refills   gabapentin (NEURONTIN) 300 MG capsule    Sig: Take 1 capsule (300 mg total) by mouth at bedtime.    Dispense:  30 capsule    Refill:  10    ERX. ERROR Code1=900 Code2=4020 There is already an active order ([id number]) for this request.   isosorbide mononitrate (IMDUR) 60 MG 24 hr tablet    Sig: Take 1 tablet (60 mg total) by mouth daily.    Dispense:  90 tablet    Refill:  1    hydrALAZINE (APRESOLINE) 100 MG tablet    Sig: Take 1 tablet (100 mg total) by mouth every 8 (eight) hours.    Dispense:  90 tablet    Refill:  0    Must have office visit for refills   spironolactone (ALDACTONE) 25 MG tablet    Sig: Take 1 tablet (25 mg total) by mouth daily.    Dispense:  90 tablet    Refill:  3    Follow-up: Return in about 1 month (around 06/17/2023) for Blood Pressure follow-up with PCP.       Hoy Register, MD, FAAFP. [facility] [place], [place] [phone]   05/20/2023, 5:49 PM

## 2023-05-20 NOTE — Patient Instructions (Signed)
[address], [place], [place] [address] Hours:  Open ? Closes 5?PM Phone: [phone]

## 2023-05-21 DIAGNOSIS — R634 Abnormal weight loss: Secondary | ICD-10-CM | POA: Diagnosis not present

## 2023-05-21 DIAGNOSIS — C20 Malignant neoplasm of rectum: Secondary | ICD-10-CM | POA: Diagnosis not present

## 2023-05-22 ENCOUNTER — Other Ambulatory Visit: Payer: Self-pay | Admitting: Family Medicine

## 2023-05-22 MED ORDER — CHLORTHALIDONE 25 MG PO TABS: 25.0000 mg | ORAL_TABLET | Freq: Every day | ORAL | 1 refills | Status: DC

## 2023-05-23 ENCOUNTER — Encounter: Payer: Self-pay | Admitting: Family Medicine

## 2023-05-23 LAB — MICROALBUMIN / CREATININE URINE RATIO
Microalb/Creat Ratio: 439 mg/g{creat} — ABNORMAL HIGH (ref 0–29)
Microalbumin, Urine: 432.6 ug/mL
Microalbumin, Urine: 432.6 ug/mL

## 2023-05-23 LAB — CMP14+EGFR
ALT: 9 [IU]/L (ref 0–44)
AST: 12 [IU]/L (ref 0–40)
Albumin: 4.2 g/dL (ref 3.8–4.9)
Alkaline Phosphatase: 145 [IU]/L — ABNORMAL HIGH (ref 44–121)
BUN/Creatinine Ratio: 17 (ref 9–20)
BUN: 53 mg/dL — ABNORMAL HIGH (ref 6–24)
Bilirubin Total: 0.3 mg/dL (ref 0.0–1.2)
CO2: 22 mmol/L (ref 20–29)
Calcium: 8.8 mg/dL (ref 8.7–10.2)
Chloride: 105 mmol/L (ref 96–106)
Creatinine, Ser: 3.11 mg/dL — ABNORMAL HIGH (ref 0.76–1.27)
Globulin, Total: 3.4 g/dL (ref 1.5–4.5)
Glucose: 84 mg/dL (ref 70–99)
Potassium: 5.4 mmol/L — ABNORMAL HIGH (ref 3.5–5.2)
Sodium: 143 mmol/L (ref 134–144)
Total Protein: 7.6 g/dL (ref 6.0–8.5)
eGFR: 22 mL/min/{1.73_m2} — ABNORMAL LOW (ref >59)

## 2023-05-28 ENCOUNTER — Other Ambulatory Visit: Payer: Self-pay | Admitting: Family Medicine

## 2023-05-29 NOTE — Telephone Encounter (Signed)
 Requested Prescriptions  Pending Prescriptions Disp Refills   hydrALAZINE (APRESOLINE) 100 MG tablet [Pharmacy Med Name: HYDRALAZINE 100MG  TABS 100 Tablet] 90 tablet 5    Sig: TAKE 1 TABLET BY MOUTH EVERY 8 HOURS *MUST HAVE OFFICE VISIT PER MD FOR REFILLS*     Cardiovascular:  Vasodilators Failed - 05/29/2023  2:03 PM      Failed - HCT in normal range and within 360 days    HCT  Date Value Ref Range Status  10/16/2022 35.9 (L) 39.0 - 52.0 % Final   Hematocrit  Date Value Ref Range Status  06/27/2021 34.5 (L) 37.5 - 51.0 % Final         Failed - HGB in normal range and within 360 days    Hemoglobin  Date Value Ref Range Status  10/16/2022 11.0 (L) 13.0 - 17.0 g/dL Final  96/07/5407 81.1 (L) 13.0 - 17.7 g/dL Final         Failed - RBC in normal range and within 360 days    RBC  Date Value Ref Range Status  10/16/2022 4.08 (L) 4.22 - 5.81 MIL/uL Final         Failed - ANA Screen, Ifa, Serum in normal range and within 360 days    No results found for: "ANA", "ANATITER", "LABANTI"       Failed - Last BP in normal range    BP Readings from Last 1 Encounters:  05/20/23 (!) 152/89         Passed - WBC in normal range and within 360 days    WBC  Date Value Ref Range Status  10/16/2022 9.7 4.0 - 10.5 K/uL Final         Passed - PLT in normal range and within 360 days    Platelets  Date Value Ref Range Status  10/16/2022 211 150 - 400 K/uL Final  06/27/2021 236 150 - 450 x10E3/uL Final         Passed - Valid encounter within last 12 months    Recent Outpatient Visits           1 week ago Type 2 diabetes mellitus with stage 4 chronic kidney disease, without long-term current use of insulin (HCC)   [facility] [place] Hoy Register, MD   9 months ago Hypertensive heart disease with chronic diastolic congestive heart failure [facility])   [facility] [place]. [facility] Hoy Register, MD   1 year ago Type 2 diabetes mellitus with stage 4 chronic kidney disease, without long-term current use of insulin (HCC)   [facility] [place]. [facility] Hoy Register, MD   1 year ago Hypertension in chronic kidney disease due to type 2 diabetes mellitus [facility])   [facility] [facility] Of [place]. [facility] Yehuda Savannah L, RPH-CPP   1 year ago Type 2 diabetes mellitus with stage 4 chronic kidney disease, without long-term current use of insulin (HCC)   [facility] [place] [facility] Of [place]. [facility] Hoy Register, MD

## 2023-06-17 NOTE — Progress Notes (Unsigned)
 Opened in error

## 2023-06-23 ENCOUNTER — Telehealth: Payer: Self-pay

## 2023-06-23 DIAGNOSIS — R159 Full incontinence of feces: Secondary | ICD-10-CM | POA: Diagnosis not present

## 2023-06-23 DIAGNOSIS — I10 Essential (primary) hypertension: Secondary | ICD-10-CM | POA: Diagnosis not present

## 2023-06-23 DIAGNOSIS — G8220 Paraplegia, unspecified: Secondary | ICD-10-CM | POA: Diagnosis not present

## 2023-06-23 NOTE — Telephone Encounter (Signed)
 Form has been faxed to [facility] today 06/23/23   Copied from CRM [id number]. Topic: General - Other >> Jun 23, 2023  1:25 PM Fredrica W wrote: Reason for CRM: Received a call from [facility] to check on status of faxed request for patient incontinence supplies. Faxed 1st on 3/14. Will refax. Thank you

## 2023-07-02 ENCOUNTER — Ambulatory Visit: Payer: Medicaid Other | Admitting: Family Medicine

## 2023-07-08 ENCOUNTER — Telehealth: Payer: Self-pay

## 2023-07-08 NOTE — Telephone Encounter (Signed)
 Original form was sent on 3/24/205 I have re faxed the form.   Copied from CRM [id number]. Topic: Clinical - Prescription Issue >> Jul 08, 2023  9:56 AM Gery Pray wrote: Reason for CRM: Rashaad from [facility] calling to check on status of faxed request for patient incontinence supplies. Faxed 1st on 3/17. Please contact [phone] in regards to update.

## 2023-08-04 ENCOUNTER — Inpatient Hospital Stay: Payer: Self-pay

## 2023-08-04 ENCOUNTER — Telehealth: Payer: Self-pay

## 2023-08-04 ENCOUNTER — Telehealth: Payer: Self-pay | Admitting: *Deleted

## 2023-08-04 ENCOUNTER — Other Ambulatory Visit: Payer: Self-pay | Admitting: *Deleted

## 2023-08-04 ENCOUNTER — Inpatient Hospital Stay: Payer: Medicaid Other | Attending: Oncology | Admitting: Oncology

## 2023-08-04 ENCOUNTER — Telehealth: Payer: Self-pay | Admitting: Oncology

## 2023-08-04 ENCOUNTER — Other Ambulatory Visit: Payer: Medicaid Other

## 2023-08-04 VITALS — BP 178/118 | HR 75 | Temp 98.1°F | Resp 17 | Wt 182.6 lb

## 2023-08-04 DIAGNOSIS — I130 Hypertensive heart and chronic kidney disease with heart failure and stage 1 through stage 4 chronic kidney disease, or unspecified chronic kidney disease: Secondary | ICD-10-CM | POA: Insufficient documentation

## 2023-08-04 DIAGNOSIS — E1122 Type 2 diabetes mellitus with diabetic chronic kidney disease: Secondary | ICD-10-CM | POA: Diagnosis not present

## 2023-08-04 DIAGNOSIS — I509 Heart failure, unspecified: Secondary | ICD-10-CM | POA: Diagnosis not present

## 2023-08-04 DIAGNOSIS — C20 Malignant neoplasm of rectum: Secondary | ICD-10-CM

## 2023-08-04 DIAGNOSIS — Z85048 Personal history of other malignant neoplasm of rectum, rectosigmoid junction, and anus: Secondary | ICD-10-CM | POA: Insufficient documentation

## 2023-08-04 DIAGNOSIS — N189 Chronic kidney disease, unspecified: Secondary | ICD-10-CM | POA: Diagnosis not present

## 2023-08-04 DIAGNOSIS — Z933 Colostomy status: Secondary | ICD-10-CM | POA: Diagnosis not present

## 2023-08-04 DIAGNOSIS — D649 Anemia, unspecified: Secondary | ICD-10-CM | POA: Insufficient documentation

## 2023-08-04 LAB — CEA (ACCESS): CEA (CHCC): 1.86 ng/mL (ref 0.00–5.00)

## 2023-08-04 NOTE — Telephone Encounter (Signed)
 Patient gave verbal understanding and had no further questions or concerns

## 2023-08-04 NOTE — Telephone Encounter (Signed)
 Patient has been scheduled for follow-up visit per 08/04/23 LOS.  Pt aware of scheduled appt details.

## 2023-08-04 NOTE — Progress Notes (Signed)
 [facility] OFFICE PROGRESS NOTE   Diagnosis: Rectal cancer  INTERVAL HISTORY:   Craig. Craig West returns as scheduled.  He relates weight loss to altered taste following the CVA.  His appetite is variable.  He has not taken blood pressure medication today.  He has not undergone a surveillance colonoscopy.  The left arm and leg remain numb.  He continues to have drainage from the lower aspect of the midline wound  Objective:  Vital signs in last 24 hours:  Blood pressure (!) 178/118, pulse 75, temperature 98.1 F (36.7 C), temperature source Temporal, resp. rate 17, weight 182 lb 9.6 oz (82.8 kg), SpO2 100%.     Lymphatics: No cervical, supraclavicular, axillary, or inguinal nodes Resp: Lungs clear bilaterally Cardio: Regular rate and rhythm GI: No mass, nontender, no hepatosplenomegaly, moisture at the low end of the midline scar with an apparent 1 mm opening Vascular: No leg edema   Lab Results:  Lab Results  Component Value Date   WBC 9.7 10/16/2022   HGB 11.0 (L) 10/16/2022   HCT 35.9 (L) 10/16/2022   MCV 88.0 10/16/2022   PLT 211 10/16/2022   NEUTROABS 7.6 10/16/2022    CMP  Lab Results  Component Value Date   NA 143 05/20/2023   K 5.4 (H) 05/20/2023   CL 105 05/20/2023   CO2 22 05/20/2023   GLUCOSE 84 05/20/2023   BUN 53 (H) 05/20/2023   CREATININE 3.11 (H) 05/20/2023   CALCIUM  8.8 05/20/2023   PROT 7.6 05/20/2023   ALBUMIN 4.2 05/20/2023   AST 12 05/20/2023   ALT 9 05/20/2023   ALKPHOS 145 (H) 05/20/2023   BILITOT 0.3 05/20/2023   GFRNONAA 20 (L) 10/16/2022   GFRAA 15 (L) 01/03/2020    Lab Results  Component Value Date   CEA1 4.40 10/10/2020   CEA 2.23 01/31/2023     Medications: I have reviewed the patient's current medications.   Assessment/Plan:  Rectal cancer-rectal mass at 5-6 cm from the anal verge on colonoscopy 01/11/2020, biopsy confirmed adenocarcinoma CTs 01/11/2020-rectal thickening, small perirectal lymph nodes and  external iliac nodes, moderate right pleural effusion MRI pelvis 01/27/2020-T3bN2b tumor at 7.1 cm from the internal anal sphincter, multiple mesorectal nodes, indistinct left pelvic sidewall node and enlarged bilateral external iliac nodes PET scan 02/11/2020-hypermetabolic rectosigmoid mass, hypermetabolic mesorectal and sigmoid mesocolon lymph nodes, left external iliac/obturator node with mild hypermetabolism, 18 mm left inguinal node with normal architecture and slight hypermetabolism Radiation and infusional 5-FU 02/21/2020-04/04/2020 CTs 05/09/2020-decreased perirectal and sigmoid mesocolon lymphadenopathy, no evidence of disease progression MRI pelvis 05/17/2020-no well-defined residual rectal mass seen.  Wall appears slightly irregular.  No perirectal extension of tumor.  Again identified is perirectal adenopathy, similar to most recent CT 05/10/2020 but improved compared to PET/CT of 02/11/2020. Cycle 1 FOLFOX 06/14/2020 06/27/2020 chemotherapy held due to hypertension Cycle 2 FOLFOX 06/29/2020 Cycle 3 FOLFOX 07/13/2020 Cycle 4 FOLFOX 07/27/2020 Cycle 5 FOLFOX 08/09/2020 Cycle 6 FOLFOX 08/24/2020 Cycle 7 FOLFOX 09/07/2020 Cycle 8 FOLFOX 09/21/2020 MRI pelvis 10/16/2020-no residual rectal mass, small presacral/perirectal nodes improved from previous MRI 12/06/2020 open low anterior resection with diverting loop colostomy, Dr. Lenore Rafter; pathology-rectal tumor location entirely below anterior peritoneal reflection; adenocarcinoma; minimal residual tumor; invades submucosa; no macroscopic tumor perforation; no lymphovascular invasion; no perineural invasion; treatment effect present with single cells or rare small groups of cancer cells (near complete response, score 1); all margins negative for invasive carcinoma; 2 of 13 lymph nodes positive; pT1, pN1b 04/04/2021 transverse loop colostomy takedown  CT chest 05/04/2021 (CT abdomen/pelvis October 2022)-no evidence of metastatic disease CTs 02/03/2022-no findings highly  suspicious for recurrent metastatic disease.  New mild mediastinal lymphadenopathy is nonspecific.  Nonspecific mild right external iliac lymphadenopathy is unchanged. CT chest 07/24/2022-resolved mediastinal adenopathy. CTs 01/31/2023-no evidence of recurrent disease, stable chronic upper presacral gas containing collection, acute left lateral eighth rib fracture, stable hyperdense right renal lesions 03/11/2023: Proctoscopy-stricture at anastomosis, Anemia Chronic renal failure Congestive heart failure-severe LVH Diabetes Gout Peripheral neuropathy? Hypertension Fever/chills 01/03/2021-CT with air-fluid collection in the presacral space extending from the level of the anus to the level of S2 worrisome for abscess or contained perforation.  Transferred to [facility].  Drain placed.  Culture positive for E. coli.  Drain removed 01/07/2021.  Repeat CT 01/08/2021 showed near resolution of the fluid component of presacral collection with persistent thick soft tissue rind not amenable to drainage.  Drain was not replaced. Influenza A 03/01/2021 Right thalamic hemorrhage February 2024     Disposition: Craig West is in clinical remission from rectal cancer.  We will follow-up on the CEA from today.  He has not undergone a surveillance colonoscopy.  We will refer him to Dr. Elvin Hammer.  The etiology of the drainage from the midline wound is unclear.  I suspect he has a small fistula.  He will follow-up with surgery if the drainage progresses.  He has severe hypertension.  I recommended he take his blood pressure medication and discontinue smoking.  He has a blood pressure machine at home.  He will repeat his blood pressure later today and seek medical attention if the blood pressure remains elevated.  Craig West will return for an office visit in 6 months. Coni Deep, MD  08/04/2023  8:52 AM

## 2023-08-04 NOTE — Telephone Encounter (Signed)
 Called Craig West with his CEA result. He took his BP med, but has not been home yet to check the BP. RN will call again tomorrow.

## 2023-08-04 NOTE — Progress Notes (Signed)
 Referral entered for surveillance colonoscopy, pt with history of rectal cancer

## 2023-08-04 NOTE — Telephone Encounter (Signed)
-----   Message from Coni Deep sent at 08/04/2023  1:34 PM EDT ----- Please call patient the CEA is normal, be sure he is taking his blood pressure medication and seeing his primary provider for management of hypertension

## 2023-08-05 ENCOUNTER — Telehealth: Payer: Self-pay | Admitting: Dietician

## 2023-08-05 NOTE — Telephone Encounter (Signed)
 Called Mr. Relyea to f/u on his BP and he reports it was 130/90 once he got home yesterday.

## 2023-08-05 NOTE — Telephone Encounter (Signed)
 Patient screened on MST, MD notes relay that weight loss is r/t taste changes. First attempt to reach. Provided my cell# on voice mail to return call to set up a nutrition consult.  Carleen Chary, RDN, LDN Registered Dietitian, [facility] Part Time Remote (Usual office hours: Tuesday-Thursday) Cell: [phone]

## 2023-08-06 ENCOUNTER — Encounter: Payer: Self-pay | Admitting: Internal Medicine

## 2023-08-12 ENCOUNTER — Inpatient Hospital Stay: Admitting: Dietician

## 2023-08-12 ENCOUNTER — Encounter: Payer: Self-pay | Admitting: *Deleted

## 2023-08-12 ENCOUNTER — Telehealth: Payer: Self-pay | Admitting: Dietician

## 2023-08-12 NOTE — Progress Notes (Signed)
 Nutrition Follow Up: Reached out to patient at home telephone number.    Reason for Assessment: continued weight loss   ASSESSMENT: Nutrition follow up competed with patient who is in clinical remission from Rectal cancer and followed by Dr. Scherrie Curt.   PMH includes CVA in Feb 24, anemia, CRF, CHF, DM (diet controlled), Gout, HTN.  Patient reports he has no pain with swallowing, nausea, or taste changes.  He describes his barrier to eating that since his stroke he puts food in mouth and then "just doesn't want anymore and can't bring himself to swallow."  He was using Ensure plus but most recent labs reflect CKD with hyperkalemia.  He finds liquids easier to ingest and admits to only trying to eat solid foods 1-2 times a day and only getting 25-50% of usual portions down. He is unaware of which DME supplied the Ensure plus when he was having it delivered.  Usual intake: Last night had pork chop rice peas and ate about 25% then " Don't want more in mouth." Yesterday had   egg sandwich with bacon and cheese, also able to eat pastrami sandwich.  Can't  eat much of a hamburger.   Recently fluids have been: Water(2 cups) Whole Milk (2 cups), Beer(3 cans).    He has no blender at home to make own smoothies, has no scale at home to monitor weight.  Nutrition Focused Physical Exam: unable to perform NFPE   Medications: reviewed   Labs: 05/20/23 K+ 5.4, BUN 53, Creat 3.11, GFR 22   Anthropometrics:   Height: 73" Weight: 182.6# UBW: 215-225 BMI: 24.09   Estimated Energy Needs  Kcals: 2500 Protein: 66-83 g Fluid: 2.5L   NUTRITION DIAGNOSIS: Unintended wt loss related to decreased oral intake as evidenced by 9% wt loss over 6 months and 13% loss from UBW of 225 pounds. Continues with 6.4% weight loss past 6 months.   INTERVENTION:   Encouraged substituting more nutrient dense liquids for beer, low potassium juices, fortified milks. Discussed ways to add calories/protein to foods  (adding cheese, butter, creamy sauces/gravy, mayo)  Encouraged more frequent meals/snacks and feeding attempts.  Relayed that with current renal function and high potassium levels I wouldn't recommend he continue with Ensure Plus and a more specialized ONS would be needed but I didn't know if he would tolerate. Suggested oral nutrition supplement Novasource Renal or Nepro Carb Smart Discussed strategies for increasing intake with soft moist protein foods and more nutrient dense liquids. Emailed Nutrition Tip sheet  for  potassium foods to limit, high calorie snacking the renal ONS  products I would recommend instead of Ensure plus  Contact information provided  MONITORING, EVALUATION, GOAL: weight trends, nutrition impact symptoms, PO intake, labs   Next Visit: Remote next week  Carleen Chary, RDN, LDN Registered Dietitian, [facility] Part Time Remote (Usual office hours: Tuesday-Thursday) Mobile: [phone] Remote Office: [phone]

## 2023-08-12 NOTE — Telephone Encounter (Signed)
 Patient screened on MST, MD notes relay that weight loss is r/t taste changes. Patient left voice mail that he was trying to reach me to schedule appointment.  Second attempt to reach. Provided my cell# on voice mail to return call to set up a nutrition consult.  Carleen Chary, RDN, LDN Registered Dietitian, [facility] Part Time Remote (Usual office hours: Tuesday-Thursday) Cell: [phone]

## 2023-08-18 DIAGNOSIS — R634 Abnormal weight loss: Secondary | ICD-10-CM | POA: Diagnosis not present

## 2023-08-18 DIAGNOSIS — C20 Malignant neoplasm of rectum: Secondary | ICD-10-CM | POA: Diagnosis not present

## 2023-08-20 ENCOUNTER — Inpatient Hospital Stay: Admitting: Dietician

## 2023-08-20 NOTE — Progress Notes (Signed)
 Nutrition Follow Up: Reached out to patient at home telephone number.    No significant changes to intake. He did receive a sample case of Johny Nap renal and mail address on file is still his address and shouldn't have concerns with delivery. He states someone called and said they were mailing him 96 bottles of Boost.  He also said he doesn't have any Nephrologist or other MD they is monitoring his serum K+ other than cancer center.  When asked about substituting more nutrient dense liquid for beer he states "I'll work on it."    I relayed that I have reached out to [employer] to see about sending Nepro Carb Smart samples but have yet to hear if samples can be made available.  Nutrition Focused Physical Exam: unable to perform NFPE   Medications: reviewed   Labs: no new labs   Anthropometrics:   Height: 73" Weight: 182.6# UBW: 215-225 BMI: 24.09   Estimated Energy Needs  Kcals: 2500 Protein: 66-83 g Fluid: 2.5L   NUTRITION DIAGNOSIS: Unintended wt loss related to decreased oral intake as evidenced by 9% wt loss over 6 months and 13% loss from UBW of 225 pounds. Continues with 6.4% weight loss past 6 months.   INTERVENTION:   Encouraged him to get contact info from calls or mailing labels from DME if he gets the Boost delivered. Cautioned to only use 1 Boost per day. Encouraged 2 [employer] until we can determine which renal formulas are available through DME. Encouraged substituting more nutrient dense liquids for beer, low potassium juices, fortified milks. Encouraged trial of high caloris snacks to monitor tolerance. Encouraged more frequent meals/snacks and feeding attempts.  Relayed that with current renal function and high potassium levels  Encouraged him to reach out to my cell, has contact information to provide info on DME.  MONITORING, EVALUATION, GOAL: weight trends, nutrition impact symptoms, PO intake, labs   Next Visit: Remote 2 weeks  Carleen Chary, RDN,  LDN Registered Dietitian,  [facility] Part Time Remote (Usual office hours: Tuesday-Thursday) Mobile: [phone] Remote Office: [phone]

## 2023-08-26 ENCOUNTER — Encounter: Payer: Self-pay | Admitting: Dietician

## 2023-08-26 NOTE — Progress Notes (Signed)
 Patient relayed that his boost is coming from [facility]. # [id number]. Called [facility] they do not have Johny Nap renal available in their formulary.  She did relay that NovaSource Renal was an option but was unaware if patient insurance would cover this specialty formula.  She relayed to alter order just need prescription sent to fax# [phone].  I email [employer] rep to have samples sent to patient to assess tolerance. Cher Cordial, RDN, CNSC Specialty Sales Executive Cell: [phone] [email or web address]   Carleen Chary, RDN, LDN Registered Dietitian, [facility] Part Time Remote (Usual office hours: Tuesday-Thursday) Mobile: [phone] Remote Office: [phone]

## 2023-09-03 ENCOUNTER — Inpatient Hospital Stay: Attending: Oncology | Admitting: Dietician

## 2023-09-03 NOTE — Progress Notes (Signed)
 Nutrition Follow Up: Reached out to patient at home telephone number.   Patient is in clinical remission from Rectal cancer and under surveillance by Dr. Scherrie Curt, follow up was scheduled for 6 months.   PMH includes CVA in Feb 24, anemia, CKD, CRF, CHF, DM (diet controlled), Gout, HTN. Patient reports little change with PO intake.  Continues with intolerance to many solid foods especially beef, when he puts food in mouth and then "just doesn't want anymore and can't bring himself to swallow."  He was was sent Boost by [facility]. However, most recent labs reflect CKD with hyperkalemia.  He was sent samples of Johny Nap Renal and Novasource renal.  When asked if he could tolerate his response was "I guess I have to." I explained that when I contacted DME they relayed that Johny Nap is not on formulary, but [place] Source is available.  They couldn't confirm that his Medicaid would reimburse for the specialty formula.   I let him know that since is under surveillance with oncology the prescription should come from him PCP. He reports that [facility] [address] [address] [facility] and see Dr. Newlin.   Labs: 05/20/23 K+ 5.4, BUN 53, Creat 3.11, GFR 22 Anthropometrics:  Height: 73" Weight: 182.6# UBW: 215-225 BMI: 24.09  Estimated Energy Needs Kcals: 2500 Protein: 66-83 g Fluid: 2.5L   NUTRITION DIAGNOSIS: Unintended wt loss related to decreased oral intake as evidenced by 9% wt loss over 6 months and 13% loss from UBW of 225 pounds. Continued with 6.4% weight loss past 6 months.  Community Health Assessment  INTERVENTION:  Encouraged substituting more nutrient dense liquids for beer, low potassium juices, fortified milks. Encouraged more frequent meals/snacks and feeding attempts with foods he tolerates that are not high in potassium.  Relayed that with current renal function and high potassium levels I would recommend a renal specific ONS and Novasource renal is on  formulary with his DME. Reviewed high potassium foods to limit. Will reach out to patient's PCP and request they fax a prescription for Novasource renal BID to [facility]. # [phone], fax# [phone].  I did caution patient that his PCP may want to schedule a follow up visit and labs prior to faxing the order. Patient has contact info for future needs.  MONITORING, EVALUATION, GOAL: weight trends, nutrition impact symptoms, PO intake, labs  Next Visit: PRN at patient or provider request  Carleen Chary, RDN, LDN Registered Dietitian, [facility] Part Time Remote (Usual office hours: Tuesday-Thursday) Mobile: [phone]

## 2023-09-30 ENCOUNTER — Ambulatory Visit (INDEPENDENT_AMBULATORY_CARE_PROVIDER_SITE_OTHER): Admitting: Internal Medicine

## 2023-09-30 ENCOUNTER — Encounter: Payer: Self-pay | Admitting: Internal Medicine

## 2023-09-30 VITALS — BP 118/70 | HR 66 | Ht 73.0 in | Wt 180.0 lb

## 2023-09-30 DIAGNOSIS — Z85048 Personal history of other malignant neoplasm of rectum, rectosigmoid junction, and anus: Secondary | ICD-10-CM | POA: Diagnosis not present

## 2023-09-30 DIAGNOSIS — F17210 Nicotine dependence, cigarettes, uncomplicated: Secondary | ICD-10-CM

## 2023-09-30 DIAGNOSIS — Z85038 Personal history of other malignant neoplasm of large intestine: Secondary | ICD-10-CM

## 2023-09-30 DIAGNOSIS — Z860100 Personal history of colon polyps, unspecified: Secondary | ICD-10-CM

## 2023-09-30 DIAGNOSIS — K624 Stenosis of anus and rectum: Secondary | ICD-10-CM

## 2023-09-30 MED ORDER — NA SULFATE-K SULFATE-MG SULF 17.5-3.13-1.6 GM/177ML PO SOLN: 1.0000 | Freq: Once | ORAL | 0 refills | Status: AC

## 2023-09-30 NOTE — Patient Instructions (Signed)
 You have been scheduled for a colonoscopy. Please follow written instructions given to you at your visit today.   If you use inhalers (even only as needed), please bring them with you on the day of your procedure.  DO NOT TAKE 7 DAYS PRIOR TO TEST- Trulicity (dulaglutide) Ozempic, Wegovy (semaglutide) Mounjaro (tirzepatide) Bydureon Bcise (exanatide extended release)  DO NOT TAKE 1 DAY PRIOR TO YOUR TEST Rybelsus (semaglutide) Adlyxin (lixisenatide) Victoza (liraglutide) Byetta (exanatide) ___________________________________________________________________________  _______________________________________________________  If your blood pressure at your visit was 140/90 or greater, please contact your primary care physician to follow up on this.  _______________________________________________________  If you are age 64 or older, your body mass index should be between 23-30. Your Body mass index is 23.75 kg/m. If this is out of the aforementioned range listed, please consider follow up with your Primary Care Provider.  If you are age 44 or younger, your body mass index should be between 19-25. Your Body mass index is 23.75 kg/m. If this is out of the aformentioned range listed, please consider follow up with your Primary Care Provider.   ________________________________________________________  The [facility] providers would like to encourage you to use MYCHART to communicate with providers for non-urgent requests or questions.  Due to long hold times on the telephone, sending your provider a message by [facility] may be a faster and more efficient way to get a response.  Please allow 48 business hours for a response.  Please remember that this is for non-urgent requests.  _______________________________________________________

## 2023-10-01 ENCOUNTER — Encounter: Payer: Self-pay | Admitting: Internal Medicine

## 2023-10-01 NOTE — Progress Notes (Signed)
 HISTORY OF PRESENT ILLNESS:  Craig West is a 58 y.o. male with past medical history as listed below who was sent today by oncology for surveillance colonoscopy.  The patient was seen by our group on January 09, 2020, as an unassigned patient, regarding rectal bleeding.  He was subsequently set up for colonoscopy with myself on January 11, 2020.  He was found to have large rectal cancer, adenomatous colon polyps, diverticulosis, and hemorrhoids.  History thereafter as follows:  - Radiation and infusional 5-FU February 21, 2020 through April 04, 2020 - 8 cycles of FOLFOX therapy completed September 21, 2020 - December 08, 2020 open low anterior resection with diverting loop colostomy at [facility]. - April 04, 2021 transverse loop colostomy takedown - March 11, 2023 proctoscopy revealed stricture at the anastomosis. - Last seen by oncology Dr. Cloretta Aug 04, 2023.  Reviewed.  Felt to be in clinical remission.  Noted to have small area of drainage at the bottom of his midline wound.  Question fistula.  Apparently his surgeon was not concerned.  Surveillance colonoscopy requested  Echo May 06, 2022 shows EF of 55 to 60%  Patient tells me that he has had some issues with his bowels.  At times narrow.  At times loose.  Last CT scan metastatic disease.  Last creatinine 3.1.  Last hemoglobin 11.0  REVIEW OF SYSTEMS:  All non-GI ROS negative.  Past Medical History:  Diagnosis Date   Asthma    CHF (congestive heart failure) (HCC)    CKD (chronic kidney disease), stage IV (HCC)    COVID-19 virus infection 04/2019   Diabetes mellitus without complication (HCC)    Fatigue 11/23/2020   Gout    Hypertension    Leg heaviness 11/23/2020   NSVT (nonsustained ventricular tachycardia) (HCC) 11/23/2020   Rectal cancer [facility])     Past Surgical History:  Procedure Laterality Date   BIOPSY  01/11/2020   Procedure: BIOPSY;  Surgeon: Abran Norleen SAILOR, MD;  Location: [facility] ENDOSCOPY;  Service: Endoscopy;;    COLONOSCOPY WITH PROPOFOL  N/A 01/11/2020   Procedure: COLONOSCOPY WITH PROPOFOL ;  Surgeon: Abran Norleen SAILOR, MD;  Location[facility] ENDOSCOPY;  Service: Endoscopy;  Laterality: N/A;   NO PAST SURGERIES     POLYPECTOMY  01/11/2020   Procedure: POLYPECTOMY;  Surgeon: Abran Norleen SAILOR, MD;  Location: [facility] ENDOSCOPY;  Service: Endoscopy;;   SUBMUCOSAL TATTOO INJECTION  01/11/2020   Procedure: SUBMUCOSAL TATTOO INJECTION;  Surgeon: Abran Norleen SAILOR, MD;  Location: [facility] ENDOSCOPY;  Service: Endoscopy;;    Social History Craig West  reports that he has been smoking cigarettes. He has never used smokeless tobacco. He reports that he does not currently use alcohol. He reports current drug use. Drug: Marijuana.  family history includes Heart failure in his brother.  No Known Allergies     PHYSICAL EXAMINATION: Vital signs: BP 118/70   Pulse 66   Ht 6' 1 (1.854 m)   Wt 180 lb (81.6 kg)   BMI 23.75 kg/m   Constitutional: generally well-appearing, no acute distress Psychiatric: alert and oriented x3, cooperative Eyes: extraocular movements intact, anicteric, conjunctiva pink Mouth: oral pharynx moist, no lesions Neck: supple no lymphadenopathy Cardiovascular: heart regular rate and rhythm, no murmur Lungs: clear to auscultation bilaterally Abdomen: soft, nontender, nondistended, no obvious ascites, no peritoneal signs, normal bowel sounds, no organomegaly.  Previous surgical incision well-healed.  Small area at the inferior portion of the wound with slight serosanguineous drainage Rectal: Deferred Extremities: no clubbing, cyanosis, or lower extremity  edema bilaterally Skin: no lesions on visible extremities Neuro: No focal deficits.   ASSESSMENT:  1.  Rectal cancer 2021 with subsequent neoadjuvant radiation and chemotherapy followed by surgical resection with temporary diverting loop colostomy, then reversed.  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  Some difficulty with bowel habits. 2.  Multiple general  medical problems.  Stable   PLAN:  1.  Schedule colonoscopy with possible balloon dilation of stricture.  The patient is higher than baseline risk given his comorbidities.The nature of the procedure, as well as the risks, benefits, and alternatives were carefully and thoroughly reviewed with the patient. Ample time for discussion and questions allowed. The patient understood, was satisfied, and agreed to proceed. 2.  Ongoing oncologic care with Dr. Cloretta 3.  Ongoing general medical care with Dr. Newlin A total time of 60 minutes was spent preparing to see the patient, reviewing multiple records, operations, treatments, laboratories, x-rays, and surgeries.  Obtaining comprehensive history, performing medically appropriate physical exam, counseling and educating the patient regarding the above listed issues, ordering colonoscopy, and documenting clinical information in the health record

## 2023-10-28 DIAGNOSIS — I10 Essential (primary) hypertension: Secondary | ICD-10-CM | POA: Diagnosis not present

## 2023-10-28 DIAGNOSIS — G8220 Paraplegia, unspecified: Secondary | ICD-10-CM | POA: Diagnosis not present

## 2023-10-28 DIAGNOSIS — R159 Full incontinence of feces: Secondary | ICD-10-CM | POA: Diagnosis not present

## 2023-11-04 ENCOUNTER — Telehealth: Payer: Self-pay | Admitting: *Deleted

## 2023-11-04 ENCOUNTER — Other Ambulatory Visit: Payer: Self-pay

## 2023-11-04 DIAGNOSIS — Z85038 Personal history of other malignant neoplasm of large intestine: Secondary | ICD-10-CM

## 2023-11-04 DIAGNOSIS — K56699 Other intestinal obstruction unspecified as to partial versus complete obstruction: Secondary | ICD-10-CM

## 2023-11-04 NOTE — Telephone Encounter (Signed)
 Dr. Abran,  This pt is scheduled with you on 11/11/2023.  His multiple comorbidities result in a ASA classification of IV.  His procedure will need to be performed at the hospital.  Best regards,  Norleen EMERSON Schillings

## 2023-11-04 NOTE — Telephone Encounter (Signed)
 Left message for pt that proceduer has to be cancelled from [facility] and scheduled at [facility]. Pt scheduled for colon with possible dilation at [facility] 01/07/24@8 :30am, pt to arrive at 7am. [phone],

## 2023-11-04 NOTE — Telephone Encounter (Signed)
 Rock, I sent you a staff note regarding this patient and rescheduling his colonoscopy at the hospital. Thanks, Dr. Abran

## 2023-11-05 ENCOUNTER — Other Ambulatory Visit: Payer: Self-pay

## 2023-11-05 DIAGNOSIS — K56699 Other intestinal obstruction unspecified as to partial versus complete obstruction: Secondary | ICD-10-CM

## 2023-11-05 DIAGNOSIS — Z85038 Personal history of other malignant neoplasm of large intestine: Secondary | ICD-10-CM

## 2023-11-05 NOTE — Telephone Encounter (Signed)
 Spoke with pt and he is aware of the change in appt. Pt is aware appt is at [facility] and knows different date and time. Pt mailed updated instructions.

## 2023-11-11 ENCOUNTER — Encounter: Admitting: Internal Medicine

## 2023-11-12 DIAGNOSIS — R634 Abnormal weight loss: Secondary | ICD-10-CM | POA: Diagnosis not present

## 2023-11-12 DIAGNOSIS — C20 Malignant neoplasm of rectum: Secondary | ICD-10-CM | POA: Diagnosis not present

## 2023-11-18 ENCOUNTER — Telehealth: Payer: Self-pay | Admitting: Family Medicine

## 2023-11-18 NOTE — Telephone Encounter (Signed)
 Copied from CRM [id number]. Topic: Clinical - Medication Question >> Nov 18, 2023  3:49 PM Essie A wrote:      Reason for CRM: Patient would like all of his prescriptions sent to this pharmacy: [facility] #[id number] [facility], [place] - [address] AT [address] [place] [address] Phone: [phone] Fax: [phone] Hours: Not open 24 hours

## 2023-11-19 ENCOUNTER — Telehealth: Payer: Self-pay | Admitting: Family Medicine

## 2023-11-19 NOTE — Telephone Encounter (Signed)
 LVM informing patient that he would need an office visit to get refills of his medications.

## 2023-11-19 NOTE — Telephone Encounter (Signed)
 Copied from CRM #[id number]. Topic: Clinical - Prescription Issue >> Nov 19, 2023  1:38 PM Gustabo D wrote: Patient wants to know what he needs to do because he's out of medication and says September is to far away

## 2023-11-19 NOTE — Telephone Encounter (Signed)
 Patient has been given an earlier appointment.

## 2023-11-25 ENCOUNTER — Encounter: Payer: Self-pay | Admitting: Family Medicine

## 2023-11-25 ENCOUNTER — Ambulatory Visit: Attending: Family Medicine | Admitting: Family Medicine

## 2023-11-25 VITALS — BP 98/59 | HR 69 | Ht 73.0 in | Wt 179.8 lb

## 2023-11-25 DIAGNOSIS — N184 Chronic kidney disease, stage 4 (severe): Secondary | ICD-10-CM

## 2023-11-25 DIAGNOSIS — I130 Hypertensive heart and chronic kidney disease with heart failure and stage 1 through stage 4 chronic kidney disease, or unspecified chronic kidney disease: Secondary | ICD-10-CM | POA: Diagnosis not present

## 2023-11-25 DIAGNOSIS — Z23 Encounter for immunization: Secondary | ICD-10-CM | POA: Diagnosis not present

## 2023-11-25 DIAGNOSIS — F32A Depression, unspecified: Secondary | ICD-10-CM

## 2023-11-25 DIAGNOSIS — I110 Hypertensive heart disease with heart failure: Secondary | ICD-10-CM

## 2023-11-25 DIAGNOSIS — I5032 Chronic diastolic (congestive) heart failure: Secondary | ICD-10-CM | POA: Diagnosis not present

## 2023-11-25 DIAGNOSIS — Z125 Encounter for screening for malignant neoplasm of prostate: Secondary | ICD-10-CM | POA: Diagnosis not present

## 2023-11-25 DIAGNOSIS — M1A00X0 Idiopathic chronic gout, unspecified site, without tophus (tophi): Secondary | ICD-10-CM | POA: Diagnosis not present

## 2023-11-25 DIAGNOSIS — E1122 Type 2 diabetes mellitus with diabetic chronic kidney disease: Secondary | ICD-10-CM | POA: Diagnosis not present

## 2023-11-25 DIAGNOSIS — I69354 Hemiplegia and hemiparesis following cerebral infarction affecting left non-dominant side: Secondary | ICD-10-CM | POA: Diagnosis not present

## 2023-11-25 DIAGNOSIS — Z85048 Personal history of other malignant neoplasm of rectum, rectosigmoid junction, and anus: Secondary | ICD-10-CM | POA: Diagnosis not present

## 2023-11-25 DIAGNOSIS — E1142 Type 2 diabetes mellitus with diabetic polyneuropathy: Secondary | ICD-10-CM | POA: Diagnosis not present

## 2023-11-25 LAB — POCT GLYCOSYLATED HEMOGLOBIN (HGB A1C): HbA1c, POC (controlled diabetic range): 5.3 % (ref 0.0–7.0)

## 2023-11-25 MED ORDER — HYDRALAZINE HCL 100 MG PO TABS: 100.0000 mg | ORAL_TABLET | Freq: Three times a day (TID) | ORAL | 5 refills | Status: AC

## 2023-11-25 MED ORDER — AMLODIPINE BESYLATE 5 MG PO TABS: 5.0000 mg | ORAL_TABLET | Freq: Every day | ORAL | 1 refills | Status: AC

## 2023-11-25 MED ORDER — CARVEDILOL 25 MG PO TABS: 25.0000 mg | ORAL_TABLET | Freq: Two times a day (BID) | ORAL | 3 refills | Status: AC

## 2023-11-25 MED ORDER — COLCHICINE 0.6 MG PO TABS: ORAL_TABLET | ORAL | 10 refills | Status: AC

## 2023-11-25 MED ORDER — ALLOPURINOL 100 MG PO TABS: 100.0000 mg | ORAL_TABLET | Freq: Every day | ORAL | 1 refills | Status: AC

## 2023-11-25 MED ORDER — FUROSEMIDE 20 MG PO TABS
20.0000 mg | ORAL_TABLET | Freq: Every day | ORAL | 1 refills | Status: AC
Start: 2023-11-25 — End: ?

## 2023-11-25 MED ORDER — GABAPENTIN 300 MG PO CAPS: 300.0000 mg | ORAL_CAPSULE | Freq: Every day | ORAL | 10 refills | Status: AC

## 2023-11-25 MED ORDER — CHLORTHALIDONE 25 MG PO TABS: 25.0000 mg | ORAL_TABLET | Freq: Every day | ORAL | 1 refills | Status: AC

## 2023-11-25 MED ORDER — ISOSORBIDE MONONITRATE ER 60 MG PO TB24: 60.0000 mg | ORAL_TABLET | Freq: Every day | ORAL | 1 refills | Status: AC

## 2023-11-25 NOTE — Progress Notes (Signed)
 Subjective:  Patient ID: Craig West, male    DOB: [birth year]  Age: 58 y.o. MRN: [id number]  CC: Medical Management of Chronic Issues     Discussed the use of AI scribe software for clinical note transcription with the patient, who gave verbal consent to proceed.  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  He is scheduled for an upcoming surveillance colonoscopy, which has been moved to a hospital setting due to safety concerns. He is unsure of the exact date as it was recently changed.  He experiences episodes of low blood pressure causing dizziness, particularly when taking his hypertension medications, which include amlodipine , chlorthalidone , carvedilol , furosemide , hydralazine , and isosorbide .  In the past, his blood pressure has been high if he forgets to take his medication.  He has a history of a stroke and experiences numbness from his head down to his on his left side. He is right-handed.  He experiences gout flares with significant swelling in his left knee, affecting his mobility. His diet consists mostly of fish and chicken.  He has stage four kidney disease and is being monitored for kidney function. He is unsure of his last nephrologist visit.  He reports losing 40 pounds after his stroke and has a decreased appetite. He smokes marijuana for depression, which he feels helps him relax. He has declined other treatments for depression.    Past Medical History:  Diagnosis Date   Asthma    CHF  (congestive heart failure) (HCC)    CKD (chronic kidney disease), stage IV (HCC)    COVID-19 virus infection 04/2019   Diabetes mellitus without complication (HCC)    Fatigue 11/23/2020   Gout    Hypertension    Leg heaviness 11/23/2020   NSVT (nonsustained ventricular tachycardia) (HCC) 11/23/2020   Rectal cancer [facility])     Past Surgical History:  Procedure Laterality Date   BIOPSY  01/11/2020   Procedure: BIOPSY;  Surgeon: Abran Norleen SAILOR, MD;  Location: [facility] ENDOSCOPY;  Service: Endoscopy;;   COLONOSCOPY WITH PROPOFOL  N/A 01/11/2020   Procedure: COLONOSCOPY WITH PROPOFOL ;  Surgeon: Abran Norleen SAILOR, MD;  Location: [facility] ENDOSCOPY;  Service: Endoscopy;  Laterality: N/A;   NO PAST SURGERIES     POLYPECTOMY  01/11/2020   Procedure: POLYPECTOMY;  Surgeon: Abran Norleen SAILOR, MD;  Location: [facility] ENDOSCOPY;  Service: Endoscopy;;   SUBMUCOSAL TATTOO INJECTION  01/11/2020   Procedure: SUBMUCOSAL TATTOO INJECTION;  Surgeon: Abran Norleen SAILOR, MD;  Location[facility] ENDOSCOPY;  Service: Endoscopy;;    Family History  Problem Relation Age of Onset   Heart failure Brother     Social History   Socioeconomic History   Marital status: Single    Spouse name: Not on file   Number of children: 2   Years of education: Not on file   Highest education level: Not on file  Occupational History   Occupation: disable  Tobacco Use   Smoking status: Every Day    Current packs/day: 0.00    Types: Cigarettes    Last attempt to quit: 10/27/2019    Years since quitting: 4.0  Smokeless tobacco: Never  Vaping Use   Vaping status: Never Used  Substance and Sexual Activity   Alcohol use: Not Currently    Comment: occ   Drug use: Yes    Types: Marijuana   Sexual activity: Not Currently  Other Topics Concern   Not on file  Social History Narrative   Not on file   Social Drivers of Health   Financial Resource Strain: High Risk (06/12/2021)   Overall Financial Resource Strain (CARDIA)    Difficulty of Paying Living  Expenses: Hard  Food Insecurity: No Food Insecurity (05/09/2022)   Hunger Vital Sign    Worried About Running Out of Food in the Last Year: Never true    Ran Out of Food in the Last Year: Never true  Transportation Needs: No Transportation Needs (05/09/2022)   PRAPARE - Administrator, Civil Service (Medical): No    Lack of Transportation (Non-Medical): No  Physical Activity: Inactive (06/12/2021)   Exercise Vital Sign    Days of Exercise per Week: 0 days    Minutes of Exercise per Session: 0 min  Stress: Stress Concern Present (06/12/2021)   [employer] of Occupational Health - Occupational Stress Questionnaire    Feeling of Stress : Rather much  Social Connections: Socially Isolated (06/12/2021)   Social Connection and Isolation Panel    Frequency of Communication with Friends and Family: Twice a week    Frequency of Social Gatherings with Friends and Family: Twice a week    Attends Religious Services: Never    Database administrator or Organizations: No    Attends Engineer, structural: Never    Marital Status: Divorced    No Known Allergies  Outpatient Medications Prior to Visit  Medication Sig Dispense Refill   albuterol  (PROVENTIL ) (2.5 MG/3ML) 0.083% nebulizer solution Take 3 mLs (2.5 mg total) by nebulization every 6 (six) hours as needed for wheezing or shortness of breath. 360 mL 2   Blood Glucose Monitoring Suppl (TRUE METRIX METER) w/Device KIT Check blood sugar fasting and ant bedtime and record 1 kit 0   docusate sodium  (COLACE) 100 MG capsule Take 1 capsule (100 mg total) by mouth 2 (two) times daily as needed for mild constipation. 10 capsule 0   glucose blood (TRUE METRIX BLOOD GLUCOSE TEST) test strip Use as instructed 200 each 12   Misc. Devices (DIGITAL GLASS SCALE) MISC Use scale to weight yourself. Increasing weight may indicate fluid overload 1 each 0   allopurinol  (ZYLOPRIM ) 100 MG tablet Take 1 tablet (100 mg total) by mouth daily. 90  tablet 1   amLODipine  (NORVASC ) 10 MG tablet Take 1 tablet (10 mg total) by mouth daily. 90 tablet 3   carvedilol  (COREG ) 25 MG tablet Take 1 tablet (25 mg total) by mouth 2 (two) times daily with a meal. 180 tablet 3   chlorthalidone  (HYGROTON ) 25 MG tablet Take 1 tablet (25 mg total) by mouth daily. 90 tablet 1   colchicine  0.6 MG tablet TAKE 1 TABLET BY MOUTH AT ONSET OF A GOUT ATTACK AS NEEDED WITH NEXT DOSE NO SOONER THAN 3 DAYS LATER 30 tablet 10   furosemide  (LASIX ) 20 MG tablet Take 1 tablet (20 mg total) by mouth daily. 180 tablet 1   gabapentin  (NEURONTIN ) 300 MG capsule Take 1 capsule (300 mg total) by mouth at bedtime. 30 capsule 10   hydrALAZINE  (APRESOLINE ) 100 MG tablet Take 1 tablet (100 mg total) by mouth every 8 (eight) hours.  90 tablet 5   isosorbide  mononitrate (IMDUR ) 60 MG 24 hr tablet Take 1 tablet (60 mg total) by mouth daily. 90 tablet 1   albuterol  (VENTOLIN  HFA) 108 (90 Base) MCG/ACT inhaler Inhale 1-2 puffs into the lungs every 6 (six) hours as needed for wheezing or shortness of breath. (Patient not taking: Reported on 11/25/2023) 18 g 2   atorvastatin  (LIPITOR ) 80 MG tablet Take 1 tablet (80 mg total) by mouth daily. (Patient not taking: Reported on 11/25/2023) 90 tablet 3   pantoprazole  (PROTONIX ) 40 MG tablet Take 1 tablet (40 mg total) by mouth at bedtime. (Patient not taking: Reported on 11/25/2023) 90 tablet 1   TRUEplus Lancets 28G MISC Check blood sugar fasting and at bedtime (Patient not taking: Reported on 02/04/2023) 210 each 2   No facility-administered medications prior to visit.     ROS Review of Systems  Constitutional:  Positive for unexpected weight change. Negative for activity change and appetite change.  HENT:  Negative for sinus pressure and sore throat.   Respiratory:  Negative for chest tightness, shortness of breath and wheezing.   Cardiovascular:  Negative for chest pain and palpitations.  Gastrointestinal:  Negative for abdominal distention,  abdominal pain and constipation.  Genitourinary: Negative.   Musculoskeletal:        See HPI  Psychiatric/Behavioral:  Negative for behavioral problems and dysphoric mood.     Objective:  BP (!) 98/59   Pulse 69   Ht 6' 1 (1.854 m)   Wt 179 lb 12.8 oz (81.6 kg)   SpO2 99%   BMI 23.72 kg/m      11/25/2023    1:53 PM 09/30/2023   10:27 AM 08/04/2023    8:38 AM  BP/Weight  Systolic BP 98 118 178  Diastolic BP 59 70 118  Wt. (Lbs) 179.8 180   BMI 23.72 kg/m2 23.75 kg/m2       Physical Exam Constitutional:      Appearance: He is well-developed.  Cardiovascular:     Rate and Rhythm: Normal rate.     Heart sounds: Normal heart sounds. No murmur heard. Pulmonary:     Effort: Pulmonary effort is normal.     Breath sounds: Normal breath sounds. No wheezing or rales.  Chest:     Chest wall: No tenderness.  Abdominal:     General: Bowel sounds are normal. There is no distension.     Palpations: Abdomen is soft. There is no mass.     Tenderness: There is no abdominal tenderness.  Musculoskeletal:     Right lower leg: No edema.     Left lower leg: No edema.     Comments: Slight left knee edema  Neurological:     Mental Status: He is alert and oriented to person, place, and time.     Comments: Strength in left upper and left lower extremity-4+/5 Strength in right upper and right lower extremity-5/5  Psychiatric:        Mood and Affect: Mood normal.        Latest Ref Rng & Units 05/20/2023    4:19 PM 10/16/2022    4:19 PM 10/16/2022    4:13 PM  CMP  Glucose 70 - 99 mg/dL 84  889  892   BUN 6 - 24 mg/dL 53  39  36   Creatinine 0.76 - 1.27 mg/dL 6.88  6.53  6.19   Sodium 134 - 144 mmol/L 143  138  140   Potassium 3.5 - 5.2  mmol/L 5.4  3.9  3.9   Chloride 96 - 106 mmol/L 105  109  109   CO2 20 - 29 mmol/L 22  19    Calcium  8.7 - 10.2 mg/dL 8.8  7.8    Total Protein 6.0 - 8.5 g/dL 7.6  7.3    Total Bilirubin 0.0 - 1.2 mg/dL 0.3  0.5    Alkaline Phos 44 - 121 IU/L 145   109    AST 0 - 40 IU/L 12  17    ALT 0 - 44 IU/L 9  11      Lipid Panel     Component Value Date/Time   CHOL 95 05/05/2022 2244   CHOL 140 06/27/2021 0927   TRIG 131 05/05/2022 2244   HDL 35 (L) 05/05/2022 2244   HDL 29 (L) 06/27/2021 0927   CHOLHDL 2.7 05/05/2022 2244   VLDL 26 05/05/2022 2244   LDLCALC 34 05/05/2022 2244   LDLCALC 81 06/27/2021 0927    CBC    Component Value Date/Time   WBC 9.7 10/16/2022 1619   RBC 4.08 (L) 10/16/2022 1619   HGB 11.0 (L) 10/16/2022 1619   HGB 11.1 (L) 06/27/2021 0927   HCT 35.9 (L) 10/16/2022 1619   HCT 34.5 (L) 06/27/2021 0927   PLT 211 10/16/2022 1619   PLT 236 06/27/2021 0927   MCV 88.0 10/16/2022 1619   MCV 82 06/27/2021 0927   MCH 27.0 10/16/2022 1619   MCHC 30.6 10/16/2022 1619   RDW 17.1 (H) 10/16/2022 1619   RDW 16.6 (H) 06/27/2021 0927   LYMPHSABS 1.0 10/16/2022 1619   LYMPHSABS 1.0 06/27/2021 0927   MONOABS 0.8 10/16/2022 1619   EOSABS 0.2 10/16/2022 1619   EOSABS 0.3 06/27/2021 0927   BASOSABS 0.1 10/16/2022 1619   BASOSABS 0.1 06/27/2021 0927    Lab Results  Component Value Date   HGBA1C 5.3 11/25/2023       Assessment & Plan Hypertensive heart disease with chronic diastolic heart failure Blood pressure low, previously high. Amlodipine  10 mg causing hypotension and dizziness. - Reduce amlodipine  to 5 mg daily. - Continue carvedilol , furosemide , hydralazine , and isosorbide  mononitrate as prescribed. -Counseled on blood pressure goal of less than 130/80, low-sodium, DASH diet, medication compliance, 150 minutes of moderate intensity exercise per week. Discussed medication compliance, adverse effects.   Type 2 diabetes mellitus with chronic kidney disease, stage 4 Diabetes is diet controlled at 5.3 Monitoring required for renal function. Recent nephrology follow-up uncertain, kidney function needs evaluation. - Order blood tests to check kidney function. - Avoid nephrotoxins  Idiopathic chronic gout, left  knee Gout flares with intermittent swelling in left knee, affecting ambulation. 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 Right-handed, difficulty lifting left arm, reduced grip strength. - Perform physical examination to assess strength and sensation.  History of rectal cancer, under active surveillance Under active surveillance with upcoming hospital-based colonoscopy due to medical history and potential procedural risks. - Proceed with hospital-based colonoscopy. - Follow up with oncologist in December.  Depression - Self-managed with marijuana use Uses marijuana for depression management. Declined pharmacotherapy and therapy referral. - Offer therapy and pharmacotherapy for depression if he changes his mind.   Healthcare maintenance PSA ordered  Meds ordered this encounter  Medications   allopurinol  (ZYLOPRIM ) 100 MG tablet    Sig: Take 1  tablet (100 mg total) by mouth daily.    Dispense:  90 tablet    Refill:  1   amLODipine  (NORVASC ) 5 MG tablet    Sig: Take 1 tablet (5 mg total) by mouth daily.    Dispense:  90 tablet    Refill:  1    Dose decrease   carvedilol  (COREG ) 25 MG tablet    Sig: Take 1 tablet (25 mg total) by mouth 2 (two) times daily with a meal.    Dispense:  180 tablet    Refill:  3   chlorthalidone  (HYGROTON ) 25 MG tablet    Sig: Take 1 tablet (25 mg total) by mouth daily.    Dispense:  90 tablet    Refill:  1   colchicine  0.6 MG tablet    Sig: TAKE 1 TABLET BY MOUTH AT ONSET OF A GOUT ATTACK AS NEEDED WITH NEXT DOSE NO SOONER THAN 3 DAYS LATER    Dispense:  30 tablet    Refill:  10   furosemide  (LASIX ) 20 MG tablet    Sig: Take 1 tablet (20 mg total) by mouth daily.    Dispense:  180 tablet    Refill:  1   gabapentin  (NEURONTIN )  300 MG capsule    Sig: Take 1 capsule (300 mg total) by mouth at bedtime.    Dispense:  30 capsule    Refill:  10    ERX. ERROR Code1=900 Code2=4020 There is already an active order ([id number]) for this request.   hydrALAZINE  (APRESOLINE ) 100 MG tablet    Sig: Take 1 tablet (100 mg total) by mouth every 8 (eight) hours.    Dispense:  90 tablet    Refill:  5   isosorbide  mononitrate (IMDUR ) 60 MG 24 hr tablet    Sig: Take 1 tablet (60 mg total) by mouth daily.    Dispense:  90 tablet    Refill:  1    Follow-up: Return in about 6 months (around 05/27/2024) for Chronic medical conditions.       Corrina Sabin, MD, FAAFP. [facility] [place], [place] [phone]   11/25/2023, 5:04 PM

## 2023-11-25 NOTE — Patient Instructions (Signed)
 VISIT SUMMARY:  Today, you came in for a follow-up visit to manage your medications and discuss your ongoing health concerns. We reviewed your current conditions, including your blood pressure, kidney function, gout, stroke recovery, colon cancer surveillance, and depression management.  YOUR PLAN:  -HYPERTENSIVE HEART DISEASE WITH HEART FAILURE: This condition involves high blood pressure and heart problems. Your blood pressure has been low, causing dizziness, so we have reduced your amlodipine  dose to 5 mg daily. Continue taking your other medications as prescribed.  -CHRONIC KIDNEY DISEASE, STAGE 4: This is a severe form of kidney disease that requires close monitoring. We will order blood tests to check your kidney function.  -IDIOPATHIC CHRONIC GOUT, LEFT KNEE: Gout is a type of arthritis that causes painful swelling, often in the joints. Continue your current gout medication regimen to manage the flares in your left knee.  -SEQUELAE OF CEREBROVASCULAR ACCIDENT WITH RESIDUAL LEFT-SIDED WEAKNESS AND NUMBNESS: This refers to the lasting effects of your stroke, including weakness and numbness on your left side. We performed a physical examination to assess your strength and sensation.  -HISTORY OF RECTAL CANCER, UNDER ACTIVE SURVEILLANCE: You are being monitored for rectal cancer. Your upcoming colonoscopy will be done in a hospital for safety reasons. Follow up with your oncologist in December.  -DEPRESSION, SELF-MANAGED WITH MARIJUANA USE: You are currently using marijuana to manage your depression. We respect your choice but want to remind you that therapy and medications are available if you change your mind.  INSTRUCTIONS:  Please remember to reduce your amlodipine  dose to 5 mg daily. We will order blood tests to check your kidney function, and you should follow up with your oncologist in December. If you experience any new or worsening symptoms, please contact our office.

## 2023-11-26 LAB — CMP14+EGFR
ALT: 15 IU/L (ref 0–44)
AST: 19 IU/L (ref 0–40)
Albumin: 4.1 g/dL (ref 3.8–4.9)
Alkaline Phosphatase: 107 IU/L (ref 44–121)
BUN/Creatinine Ratio: 15 (ref 10–24)
BUN: 60 mg/dL — ABNORMAL HIGH (ref 8–27)
Bilirubin Total: 0.8 mg/dL (ref 0.0–1.2)
CO2: 22 mmol/L (ref 20–29)
Calcium: 8.8 mg/dL (ref 8.6–10.2)
Chloride: 103 mmol/L (ref 96–106)
Creatinine, Ser: 3.91 mg/dL — ABNORMAL HIGH (ref 0.76–1.27)
Globulin, Total: 2.8 g/dL (ref 1.5–4.5)
Glucose: 94 mg/dL (ref 70–99)
Potassium: 4.5 mmol/L (ref 3.5–5.2)
Sodium: 140 mmol/L (ref 134–144)
Total Protein: 6.9 g/dL (ref 6.0–8.5)
eGFR: 17 mL/min/1.73 — ABNORMAL LOW (ref >59)

## 2023-11-27 ENCOUNTER — Ambulatory Visit: Payer: Self-pay | Admitting: Family Medicine

## 2023-11-27 LAB — PSA, TOTAL AND FREE
PSA, Free Pct: 30.9 %
PSA, Free: 0.34 ng/mL
Prostate Specific Ag, Serum: 1.1 ng/mL (ref 0.0–4.0)

## 2023-11-27 LAB — SPECIMEN STATUS REPORT

## 2023-12-03 ENCOUNTER — Emergency Department (HOSPITAL_COMMUNITY)

## 2023-12-03 ENCOUNTER — Other Ambulatory Visit: Payer: Self-pay

## 2023-12-03 ENCOUNTER — Encounter (HOSPITAL_COMMUNITY): Payer: Self-pay | Admitting: Emergency Medicine

## 2023-12-03 ENCOUNTER — Telehealth: Payer: Self-pay | Admitting: Gastroenterology

## 2023-12-03 ENCOUNTER — Emergency Department (HOSPITAL_COMMUNITY)
Admission: EM | Admit: 2023-12-03 | Discharge: 2023-12-03 | Disposition: A | Discharge status: Home / Self Care | Attending: Emergency Medicine | Admitting: Emergency Medicine

## 2023-12-03 DIAGNOSIS — C20 Malignant neoplasm of rectum: Secondary | ICD-10-CM | POA: Insufficient documentation

## 2023-12-03 DIAGNOSIS — N200 Calculus of kidney: Secondary | ICD-10-CM | POA: Diagnosis not present

## 2023-12-03 DIAGNOSIS — K5730 Diverticulosis of large intestine without perforation or abscess without bleeding: Secondary | ICD-10-CM | POA: Diagnosis not present

## 2023-12-03 DIAGNOSIS — K625 Hemorrhage of anus and rectum: Secondary | ICD-10-CM | POA: Insufficient documentation

## 2023-12-03 DIAGNOSIS — Q630 Accessory kidney: Secondary | ICD-10-CM | POA: Diagnosis not present

## 2023-12-03 DIAGNOSIS — Z85038 Personal history of other malignant neoplasm of large intestine: Secondary | ICD-10-CM

## 2023-12-03 LAB — COMPREHENSIVE METABOLIC PANEL WITH GFR
ALT: 13 U/L (ref 0–44)
AST: 19 U/L (ref 15–41)
Albumin: 3.1 g/dL — ABNORMAL LOW (ref 3.5–5.0)
Alkaline Phosphatase: 96 U/L (ref 38–126)
Anion gap: 11 (ref 5–15)
BUN: 63 mg/dL — ABNORMAL HIGH (ref 6–20)
CO2: 21 mmol/L — ABNORMAL LOW (ref 22–32)
Calcium: 8.2 mg/dL — ABNORMAL LOW (ref 8.9–10.3)
Chloride: 105 mmol/L (ref 98–111)
Creatinine, Ser: 3.29 mg/dL — ABNORMAL HIGH (ref 0.61–1.24)
GFR, Estimated: 21 mL/min — ABNORMAL LOW (ref >60)
Glucose, Bld: 101 mg/dL — ABNORMAL HIGH (ref 70–99)
Potassium: 4.2 mmol/L (ref 3.5–5.1)
Sodium: 137 mmol/L (ref 135–145)
Total Bilirubin: 0.7 mg/dL (ref 0.0–1.2)
Total Protein: 6.7 g/dL (ref 6.5–8.1)

## 2023-12-03 LAB — I-STAT CHEM 8, ED
BUN: 55 mg/dL — ABNORMAL HIGH (ref 6–20)
Calcium, Ion: 1.01 mmol/L — ABNORMAL LOW (ref 1.15–1.40)
Chloride: 106 mmol/L (ref 98–111)
Creatinine, Ser: 3.5 mg/dL — ABNORMAL HIGH (ref 0.61–1.24)
Glucose, Bld: 99 mg/dL (ref 70–99)
HCT: 34 % — ABNORMAL LOW (ref 39.0–52.0)
Hemoglobin: 11.6 g/dL — ABNORMAL LOW (ref 13.0–17.0)
Potassium: 4.1 mmol/L (ref 3.5–5.1)
Sodium: 138 mmol/L (ref 135–145)
TCO2: 20 mmol/L — ABNORMAL LOW (ref 22–32)

## 2023-12-03 LAB — CBC
HCT: 35.4 % — ABNORMAL LOW (ref 39.0–52.0)
Hemoglobin: 11.3 g/dL — ABNORMAL LOW (ref 13.0–17.0)
MCH: 30.3 pg (ref 26.0–34.0)
MCHC: 31.9 g/dL (ref 30.0–36.0)
MCV: 94.9 fL (ref 80.0–100.0)
Platelets: 183 K/uL (ref 150–400)
RBC: 3.73 MIL/uL — ABNORMAL LOW (ref 4.22–5.81)
RDW: 14.6 % (ref 11.5–15.5)
WBC: 8.3 K/uL (ref 4.0–10.5)
nRBC: 0 % (ref 0.0–0.2)

## 2023-12-03 LAB — POC OCCULT BLOOD, ED: Fecal Occult Bld: POSITIVE — AB

## 2023-12-03 LAB — TYPE AND SCREEN
ABO/RH(D): O POS
Antibody Screen: NEGATIVE

## 2023-12-03 MED ORDER — IOHEXOL 350 MG/ML SOLN
60.0000 mL | Freq: Once | INTRAVENOUS | Status: AC | PRN
  Administered 2023-12-03: 60 mL via INTRAVENOUS

## 2023-12-03 NOTE — ED Notes (Signed)
 Patient states he cannot sit up anymore, patient remains sitting in his wheelchair.

## 2023-12-03 NOTE — ED Provider Triage Note (Signed)
 Emergency Medicine Provider Triage Evaluation Note  Craig West , a 58 y.o. male  was evaluated in triage.  Pt complains of Blood per rectum over the last 3 days.  He states sometimes it is dark red and others is bright red but is copious in volume.  Does report a history of colon cancer.  Review of Systems  Positive: As above Negative:   Physical Exam  BP 111/78   Pulse 84   Temp 98.1 F (36.7 C)   Resp 20   SpO2 99%  Gen:   Awake, no distress   Resp:  Normal effort  MSK:   Moves extremities without difficulty  Other:  DRE does show positive Hemoccult, no anal fissure, no internal or external hemorrhoids appreciated with normal prostate.  Medical Decision Making  Medically screening exam initiated at 3:07 PM.  Appropriate orders placed.  Craig West was informed that the remainder of the evaluation will be completed by another provider, this initial triage assessment does not replace that evaluation, and the importance of remaining in the ED until their evaluation is complete.  Initial lab work ordered by nursing staff, initiated IV and sent for CT angiography of the abdomen to assess for GI bleed.   Craig West, Craig West 12/03/23 [phone]

## 2023-12-03 NOTE — Telephone Encounter (Signed)
 Got a call from the ED tonight - patient went there for some rectal bleeding in recent days. Hgb stable. CTA negative for active bleeding but some nonspecific rectal changes. Chart reviewed, history of rectal cancer treated with XRT, chemo, surgery. Could have radiation changes there causing symptoms but needs colonoscopy to ensure no recurrent malignancy. He is scheduled at [facility] on 10/8 with Dr. Abran due to comorbidities.   ED says he is stable. Bleeding seems to be going on for a few days and Hgb higher than baseline, patient wanted to go home.  POD A RN - can you please see if anyone has an opening at [facility] in the next few weeks to accommodate this patient for a colonoscopy to expedite his workup, if not he will have to wait for currently scheduled exam on 10/8 or be admitted to the hospital in the interim if bleeding worsens. I may have an opening next week due to a cancellation, can add this patient into my schedule if open I am happy to help with it.  John - FYI, for your awareness. I am happy to help do his case sooner if I have an opening, so he does not have to wait until October in light of recent symptoms.

## 2023-12-03 NOTE — ED Triage Notes (Addendum)
 Pt c/o dark red rectal bleeding x3 days.  Pt reports bleeding outside of BMs.  Denies pain.  Hx of colon CA.    Pt has colonscopy scheduled for 10/8.

## 2023-12-03 NOTE — ED Provider Notes (Signed)
 Notified by CT of elevated creatinine with patient receiving CT angiography.  He has a history of CKD stage IV, and creatinine is baseline for him.  Benefit outweighs risk at this time as he has a likely GI bleed and needs to be evaluated for colonic etiology of his rectal bleed.  At this time, we will go forward with CT angiography of the abdomen to assess for etiology of his GI bleed as the benefit of assessing his GI bleed overrides the potential risk of CT contrast administration.   Craig Dorn BROCKS, PA 12/03/23 1639    Craig Duwaine CROME, DO 12/06/23 [phone]

## 2023-12-03 NOTE — Discharge Instructions (Signed)
 Please follow up with your GI doctor regaurding todays rectal bleeding and your colonoscopy scheduled for Oct 8th.

## 2023-12-03 NOTE — ED Provider Notes (Addendum)
 [place] EMERGENCY DEPARTMENT AT [facility] Provider Note   CSN: [id number] Arrival date & time: 12/03/23  1324     Patient presents with: Rectal Bleeding   Craig West is a 58 y.o. male.   Patient is a 58 year old male with past medical history of rectal cancer with a low anterior resection with diverting loop colostomy in 2022 and loop colostomy takedown in 2023 presenting for rectal bleeding.  Patient midst of bright red blood per rectum x 3 days.  Patient states at times he feels like he is can have a bowel movement and then goes to the bathroom and just has blood.  He denies any lightheadedness dizziness, or syncope.  He denies any abdominal pain.  Chart review demonstrates that patient follows with [facility] with Dr. Norleen Kiang and was last seen in July 2025 when he was referred by oncology for recurrent rectal bleeding.  He was scheduled to have a colonoscopy in the office which was then canceled due to concern for multiple comorbidities and rescheduled for January 07, 2024 at [facility].  Patient's last imaging was in November 2024 in which there was no recurrent cancer suggested at that time.  The history is provided by the patient. No language interpreter was used.  Rectal Bleeding Associated symptoms: no abdominal pain, no fever and no vomiting        Prior to Admission medications   Medication Sig Start Date End Date Taking? Authorizing Provider  albuterol  (PROVENTIL ) (2.5 MG/3ML) 0.083% nebulizer solution Take 3 mLs (2.5 mg total) by nebulization every 6 (six) hours as needed for wheezing or shortness of breath. 09/03/22   Newlin, Enobong, MD  albuterol  (VENTOLIN  HFA) 108 (90 Base) MCG/ACT inhaler Inhale 1-2 puffs into the lungs every 6 (six) hours as needed for wheezing or shortness of breath. Patient not taking: Reported on 11/25/2023 03/06/22   Lynwood Lenis, PA-C  allopurinol  (ZYLOPRIM ) 100 MG tablet Take 1 tablet (100 mg total) by mouth daily. 11/25/23    Newlin, Enobong, MD  amLODipine  (NORVASC ) 5 MG tablet Take 1 tablet (5 mg total) by mouth daily. 11/25/23   Newlin, Enobong, MD  atorvastatin  (LIPITOR ) 80 MG tablet Take 1 tablet (80 mg total) by mouth daily. Patient not taking: Reported on 11/25/2023 05/20/23   Newlin, Enobong, MD  Blood Glucose Monitoring Suppl (TRUE METRIX METER) w/Device KIT Check blood sugar fasting and ant bedtime and record 05/15/17   Newlin, Enobong, MD  carvedilol  (COREG ) 25 MG tablet Take 1 tablet (25 mg total) by mouth 2 (two) times daily with a meal. 11/25/23   Delbert Clam, MD  chlorthalidone  (HYGROTON ) 25 MG tablet Take 1 tablet (25 mg total) by mouth daily. 11/25/23   Newlin, Enobong, MD  colchicine  0.6 MG tablet TAKE 1 TABLET BY MOUTH AT ONSET OF A GOUT ATTACK AS NEEDED WITH NEXT DOSE NO SOONER THAN 3 DAYS LATER 11/25/23   Delbert Clam, MD  docusate sodium  (COLACE) 100 MG capsule Take 1 capsule (100 mg total) by mouth 2 (two) times daily as needed for mild constipation. 05/14/22   Lue Elsie BROCKS, MD  furosemide  (LASIX ) 20 MG tablet Take 1 tablet (20 mg total) by mouth daily. 11/25/23   Newlin, Enobong, MD  gabapentin  (NEURONTIN ) 300 MG capsule Take 1 capsule (300 mg total) by mouth at bedtime. 11/25/23   Newlin, Enobong, MD  glucose blood (TRUE METRIX BLOOD GLUCOSE TEST) test strip Use as instructed 12/27/19   Laurence Fonda GRADE, MD  hydrALAZINE  (APRESOLINE )  100 MG tablet Take 1 tablet (100 mg total) by mouth every 8 (eight) hours. 11/25/23   Newlin, Enobong, MD  isosorbide  mononitrate (IMDUR ) 60 MG 24 hr tablet Take 1 tablet (60 mg total) by mouth daily. 11/25/23   Delbert Clam, MD  Misc. Devices (DIGITAL GLASS SCALE) MISC Use scale to weight yourself. Increasing weight may indicate fluid overload 12/27/19   Laurence Fonda GRADE, MD  pantoprazole  (PROTONIX ) 40 MG tablet Take 1 tablet (40 mg total) by mouth at bedtime. Patient not taking: Reported on 11/25/2023 09/03/22   Newlin, Enobong, MD  TRUEplus Lancets 28G MISC Check  blood sugar fasting and at bedtime Patient not taking: Reported on 02/04/2023 12/27/19   Laurence Fonda GRADE, MD    Allergies: Patient has no known allergies.    Review of Systems  Constitutional:  Negative for chills and fever.  HENT:  Negative for ear pain and sore throat.   Eyes:  Negative for pain and visual disturbance.  Respiratory:  Negative for cough and shortness of breath.   Cardiovascular:  Negative for chest pain and palpitations.  Gastrointestinal:  Positive for hematochezia. Negative for abdominal pain and vomiting.       Rectal bleeding  Genitourinary:  Negative for dysuria and hematuria.  Musculoskeletal:  Negative for arthralgias and back pain.  Skin:  Negative for color change and rash.  Neurological:  Negative for seizures and syncope.  All other systems reviewed and are negative.   Updated Vital Signs BP (!) 148/92   Pulse 78   Temp 98.3 F (36.8 C)   Resp 18   SpO2 99%   Physical Exam Vitals and nursing note reviewed.  Constitutional:      General: He is not in acute distress.    Appearance: He is well-developed.  HENT:     Head: Normocephalic and atraumatic.  Eyes:     Conjunctiva/sclera: Conjunctivae normal.  Cardiovascular:     Rate and Rhythm: Normal rate and regular rhythm.     Heart sounds: No murmur heard. Pulmonary:     Effort: Pulmonary effort is normal. No respiratory distress.     Breath sounds: Normal breath sounds.  Abdominal:     Palpations: Abdomen is soft.     Tenderness: There is no abdominal tenderness.  Musculoskeletal:        General: No swelling.     Cervical back: Neck supple.  Skin:    General: Skin is warm and dry.     Capillary Refill: Capillary refill takes less than 2 seconds.  Neurological:     Mental Status: He is alert.  Psychiatric:        Mood and Affect: Mood normal.     (all labs ordered are listed, but only abnormal results are displayed) Labs Reviewed  COMPREHENSIVE METABOLIC PANEL WITH GFR - Abnormal;  Notable for the following components:      Result Value   CO2 21 (*)    Glucose, Bld 101 (*)    BUN 63 (*)    Creatinine, Ser 3.29 (*)    Calcium  8.2 (*)    Albumin 3.1 (*)    GFR, Estimated 21 (*)    All other components within normal limits  CBC - Abnormal; Notable for the following components:   RBC 3.73 (*)    Hemoglobin 11.3 (*)    HCT 35.4 (*)    All other components within normal limits  POC OCCULT BLOOD, ED - Abnormal; Notable for the following components:   Fecal  Occult Bld POSITIVE (*)    All other components within normal limits  I-STAT CHEM 8, ED - Abnormal; Notable for the following components:   BUN 55 (*)    Creatinine, Ser 3.50 (*)    Calcium , Ion 1.01 (*)    TCO2 20 (*)    Hemoglobin 11.6 (*)    HCT 34.0 (*)    All other components within normal limits  TYPE AND SCREEN    EKG: None  Radiology: CT ANGIO GI BLEED Result Date: 12/03/2023 CLINICAL DATA:  GI bleed.  History of rectal cancer. EXAM: CTA ABDOMEN AND PELVIS WITHOUT AND WITH CONTRAST TECHNIQUE: Multidetector CT imaging of the abdomen and pelvis was performed using the standard protocol during bolus administration of intravenous contrast. Multiplanar reconstructed images and MIPs were obtained and reviewed to evaluate the vascular anatomy. RADIATION DOSE REDUCTION: This exam was performed according to the departmental dose-optimization program which includes automated exposure control, adjustment of the mA and/or kV according to patient size and/or use of iterative reconstruction technique. CONTRAST:  60mL OMNIPAQUE  IOHEXOL  350 MG/ML SOLN COMPARISON:  CT dated 01/31/2023. FINDINGS: VASCULAR Aorta: Normal caliber aorta without aneurysm, dissection, vasculitis or significant stenosis. Celiac: Patent without evidence of aneurysm, dissection, vasculitis or significant stenosis. SMA: Patent without evidence of aneurysm, dissection, vasculitis or significant stenosis. Renals: Duplicated renal arteries bilaterally.  The renal arteries are patent. IMA: The origin of the IMA is visualized.  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 Inflow: Patent without evidence of aneurysm, dissection, vasculitis or significant stenosis. Proximal Outflow: The visualized proximal outflow is patent. Veins: The IVC is unremarkable. The SMV, splenic vein, and main portal vein are patent. No portal venous gas. Review of the MIP images confirms the above findings. NON-VASCULAR Lower chest: The visualized lung bases are clear. No intra-abdominal free air or free fluid. Hepatobiliary: The liver is unremarkable. No biliary ductal dilatation. The gallbladder is unremarkable. Pancreas: Unremarkable. No pancreatic ductal dilatation or surrounding inflammatory changes. Spleen: Normal in size without focal abnormality. Adrenals/Urinary Tract: The adrenal glands are unremarkable. Small bilateral renal cysts and additional subcentimeter hypodense lesions which are too small to characterize. Small high attenuating cysts from the upper pole of the right kidney, likely hemorrhagic or proteinaceous cyst. This can be better evaluated with ultrasound on a nonemergent/outpatient basis. Punctate nonobstructing right renal inferior pole calculi noted. There is no hydronephrosis or obstructing stone. The visualized ureters and urinary bladder appear unremarkable. Stomach/Bowel: Postsurgical changes from prior low anterior resection and colo-anal anastomosis. Progressive perirectal soft tissue thickening and infiltration, significantly progressed since the prior CT. Although findings may be related to radiation changes, recurrent disease is not excluded. Clinical correlation and further evaluation with PET-CT is recommended. There is mild sigmoid diverticulosis. There is no bowel obstruction. No evidence of active GI bleed. The appendix is normal. Lymphatic: No adenopathy. Reproductive: The prostate is grossly unremarkable. Other: Midline vertical anterior pelvic wall incisional scar.  Musculoskeletal: Degenerative changes of the spine. No acute osseous pathology. IMPRESSION: 1. No evidence of active GI bleed. 2. Postsurgical changes from prior low anterior resection and colo-anal anastomosis. Progressive perirectal soft tissue thickening and infiltration. Recurrent disease is not excluded. Clinical correlation and further evaluation with PET-CT is recommended. 3. Mild sigmoid diverticulosis. No bowel obstruction. Normal appendix. 4. Punctate nonobstructing right renal inferior pole calculi. No hydronephrosis or obstructing stone. Electronically Signed   By: Vanetta Chou M.D.   On: 12/03/2023 17:38     Procedures   Medications Ordered in the ED  iohexol  (OMNIPAQUE )  350 MG/ML injection 60 mL (60 mLs Intravenous Contrast Given 12/03/23 1701)                                    Medical Decision Making Amount and/or Complexity of Data Reviewed Labs: ordered.   58 year old male with past medical history of rectal cancer with a low anterior resection with diverting loop colostomy in 2022 and loop colostomy takedown in 2023 presenting for rectal bleeding.    Chart review demonstrates that patient follows with [facility] with Dr. Norleen Kiang and was last seen in July 2025 when he was referred by oncology for recurrent rectal bleeding.  He was scheduled to have a colonoscopy in the office which was then canceled due to concern for multiple comorbidities and rescheduled for January 07, 2024 at [facility].    Patient's last imaging was in November 2024 in which there was no recurrent cancer suggested at that time.  Abdomen is soft and nontender on exam.  Rectal exam was completed by  PA Gilliam which demonstrated stool mixed with blood.  No active hemorrhaging.  Hemoccult positive.  Vitals are stable.  Hemoglobin is stable at 11.6.  CTA for GI bleed demonstrates no acute bleeding.  However there is some concerning findings for progressive perirectal soft tissue thickening and  infiltration.  I have consulted [facility] who is going to speak with their scheduling team to see if they are able to move up the patient's October 8 colonoscopy.  Obviously there are no promises.  Both of us  highly recommend that the patient return for any worsening of bleeding for admission and earlier colonoscopy.  Patient agreeable to plan.  Patient in no distress and overall condition improved here in the ED. Detailed discussions were had with the patient regarding current findings, and need for close f/u with PCP or on call doctor. The patient has been instructed to return immediately if the symptoms worsen in any way for re-evaluation. Patient verbalized understanding and is in agreement with current care plan. All questions answered prior to discharge.      Final diagnoses:  Rectal bleeding  Rectal cancer [facility])    ED Discharge Orders          Ordered    Ambulatory referral to Gastroenterology       Comments: FYI: Patient of yours presented to ED for rectal bleeding x 3 days. Otherwise stable and Dc'd home. Has colonoscopy at [facility] scheduled with you for Oct 8th.   12/03/23 1947               Elnor Bernarda SQUIBB, DO 12/03/23 2024    Elnor Bernarda SQUIBB, DO 12/03/23 2024

## 2023-12-04 ENCOUNTER — Encounter (HOSPITAL_COMMUNITY): Payer: Self-pay | Admitting: Gastroenterology

## 2023-12-04 NOTE — Telephone Encounter (Signed)
Thanks Steve

## 2023-12-04 NOTE — Telephone Encounter (Signed)
 I have spoken to patient and advised that we have moved his appointment from 01/07/24 at [facility] with Dr Abran to a sooner appointment with Dr Leigh on 12/08/23 at 8 am, 630 am arrival. He has been advised that updated written prep instructions have been made available in his mychart for review and has been reminded he will need a care partner 18 years or older to bring him, stay for procedure and drive him home due to sedation. Patient verbalizes understanding and is in agreement with this plan.

## 2023-12-04 NOTE — Addendum Note (Signed)
 Addended by: CLAUDENE NAOMIE SAILOR on: 12/04/2023 10:06 AM   Modules accepted: Orders

## 2023-12-05 ENCOUNTER — Encounter: Payer: Self-pay | Admitting: Oncology

## 2023-12-06 NOTE — Anesthesia Preprocedure Evaluation (Signed)
 Anesthesia Evaluation  Patient identified by MRN, date of birth, ID band Patient awake    Reviewed: Allergy & Precautions, NPO status , Patient's Chart, lab work & pertinent test results  Airway Mallampati: II  TM Distance: >3 FB Neck ROM: Full    Dental  (+) Chipped, Dental Advisory Given,    Pulmonary asthma , Current Smoker   breath sounds clear to auscultation       Cardiovascular hypertension, +CHF   Rhythm:Regular Rate:Normal     Neuro/Psych CVA  negative psych ROS   GI/Hepatic negative GI ROS, Neg liver ROS,,,  Endo/Other  diabetes    Renal/GU Renal disease     Musculoskeletal negative musculoskeletal ROS (+)    Abdominal   Peds  Hematology  (+) Blood dyscrasia, anemia   Anesthesia Other Findings   Reproductive/Obstetrics                              Anesthesia Physical Anesthesia Plan  ASA: 3  Anesthesia Plan: MAC   Post-op Pain Management: Minimal or no pain anticipated   Induction: Intravenous  PONV Risk Score and Plan: 0 and Propofol  infusion  Airway Management Planned: Natural Airway and Nasal Cannula  Additional Equipment: None  Intra-op Plan:   Post-operative Plan:   Informed Consent: I have reviewed the patients History and Physical, chart, labs and discussed the procedure including the risks, benefits and alternatives for the proposed anesthesia with the patient or authorized representative who has indicated his/her understanding and acceptance.       Plan Discussed with: CRNA  Anesthesia Plan Comments:          Anesthesia Quick Evaluation

## 2023-12-08 ENCOUNTER — Other Ambulatory Visit: Payer: Self-pay | Admitting: *Deleted

## 2023-12-08 ENCOUNTER — Encounter (HOSPITAL_COMMUNITY): Payer: Self-pay | Admitting: Gastroenterology

## 2023-12-08 ENCOUNTER — Other Ambulatory Visit: Payer: Self-pay

## 2023-12-08 ENCOUNTER — Ambulatory Visit (HOSPITAL_COMMUNITY)
Admission: RE | Admit: 2023-12-08 | Discharge: 2023-12-08 | Disposition: A | Discharge status: Home / Self Care | Attending: Gastroenterology | Admitting: Gastroenterology

## 2023-12-08 ENCOUNTER — Ambulatory Visit (HOSPITAL_BASED_OUTPATIENT_CLINIC_OR_DEPARTMENT_OTHER): Payer: Self-pay | Admitting: Anesthesiology

## 2023-12-08 ENCOUNTER — Ambulatory Visit (HOSPITAL_COMMUNITY): Payer: Self-pay | Admitting: Anesthesiology

## 2023-12-08 ENCOUNTER — Encounter (HOSPITAL_COMMUNITY)
Admission: RE | Disposition: A | Discharge status: Home / Self Care | Payer: Self-pay | Source: Home / Self Care | Attending: Gastroenterology

## 2023-12-08 DIAGNOSIS — K5730 Diverticulosis of large intestine without perforation or abscess without bleeding: Secondary | ICD-10-CM

## 2023-12-08 DIAGNOSIS — F17210 Nicotine dependence, cigarettes, uncomplicated: Secondary | ICD-10-CM | POA: Diagnosis not present

## 2023-12-08 DIAGNOSIS — I130 Hypertensive heart and chronic kidney disease with heart failure and stage 1 through stage 4 chronic kidney disease, or unspecified chronic kidney disease: Secondary | ICD-10-CM

## 2023-12-08 DIAGNOSIS — Z1211 Encounter for screening for malignant neoplasm of colon: Secondary | ICD-10-CM

## 2023-12-08 DIAGNOSIS — J45909 Unspecified asthma, uncomplicated: Secondary | ICD-10-CM | POA: Insufficient documentation

## 2023-12-08 DIAGNOSIS — Z8673 Personal history of transient ischemic attack (TIA), and cerebral infarction without residual deficits: Secondary | ICD-10-CM | POA: Insufficient documentation

## 2023-12-08 DIAGNOSIS — K56699 Other intestinal obstruction unspecified as to partial versus complete obstruction: Secondary | ICD-10-CM

## 2023-12-08 DIAGNOSIS — Z980 Intestinal bypass and anastomosis status: Secondary | ICD-10-CM | POA: Diagnosis not present

## 2023-12-08 DIAGNOSIS — I509 Heart failure, unspecified: Secondary | ICD-10-CM | POA: Insufficient documentation

## 2023-12-08 DIAGNOSIS — Z604 Social exclusion and rejection: Secondary | ICD-10-CM | POA: Insufficient documentation

## 2023-12-08 DIAGNOSIS — E1122 Type 2 diabetes mellitus with diabetic chronic kidney disease: Secondary | ICD-10-CM | POA: Insufficient documentation

## 2023-12-08 DIAGNOSIS — B259 Cytomegaloviral disease, unspecified: Secondary | ICD-10-CM | POA: Insufficient documentation

## 2023-12-08 DIAGNOSIS — N184 Chronic kidney disease, stage 4 (severe): Secondary | ICD-10-CM | POA: Insufficient documentation

## 2023-12-08 DIAGNOSIS — Z8249 Family history of ischemic heart disease and other diseases of the circulatory system: Secondary | ICD-10-CM | POA: Insufficient documentation

## 2023-12-08 DIAGNOSIS — B0089 Other herpesviral infection: Secondary | ICD-10-CM | POA: Diagnosis not present

## 2023-12-08 DIAGNOSIS — K6289 Other specified diseases of anus and rectum: Secondary | ICD-10-CM | POA: Diagnosis not present

## 2023-12-08 DIAGNOSIS — Z5986 Financial insecurity: Secondary | ICD-10-CM | POA: Insufficient documentation

## 2023-12-08 DIAGNOSIS — A601 Herpesviral infection of perianal skin and rectum: Secondary | ICD-10-CM | POA: Diagnosis not present

## 2023-12-08 DIAGNOSIS — K625 Hemorrhage of anus and rectum: Secondary | ICD-10-CM | POA: Diagnosis not present

## 2023-12-08 DIAGNOSIS — Z9221 Personal history of antineoplastic chemotherapy: Secondary | ICD-10-CM | POA: Diagnosis not present

## 2023-12-08 DIAGNOSIS — K626 Ulcer of anus and rectum: Secondary | ICD-10-CM | POA: Diagnosis not present

## 2023-12-08 DIAGNOSIS — Z85038 Personal history of other malignant neoplasm of large intestine: Secondary | ICD-10-CM

## 2023-12-08 DIAGNOSIS — I5032 Chronic diastolic (congestive) heart failure: Secondary | ICD-10-CM

## 2023-12-08 DIAGNOSIS — K629 Disease of anus and rectum, unspecified: Secondary | ICD-10-CM

## 2023-12-08 DIAGNOSIS — Z85048 Personal history of other malignant neoplasm of rectum, rectosigmoid junction, and anus: Secondary | ICD-10-CM | POA: Diagnosis present

## 2023-12-08 HISTORY — PX: COLONOSCOPY: SHX5424

## 2023-12-08 SURGERY — COLONOSCOPY
Anesthesia: Monitor Anesthesia Care

## 2023-12-08 MED ORDER — AMISULPRIDE (ANTIEMETIC) 5 MG/2ML IV SOLN: 10.0000 mg | Freq: Once | INTRAVENOUS | Status: DC | PRN

## 2023-12-08 MED ORDER — PROPOFOL 500 MG/50ML IV EMUL
INTRAVENOUS | Status: DC | PRN
  Administered 2023-12-08: 120 ug/kg/min via INTRAVENOUS

## 2023-12-08 MED ORDER — LIDOCAINE HCL 1 % IJ SOLN
INTRAMUSCULAR | Status: DC | PRN
  Administered 2023-12-08: 60 mg via INTRADERMAL

## 2023-12-08 MED ORDER — PHENYLEPHRINE HCL (PRESSORS) 10 MG/ML IV SOLN
INTRAVENOUS | Status: AC
Start: 2023-12-08 — End: 2023-12-08
  Filled 2023-12-08: qty 1

## 2023-12-08 MED ORDER — PROPOFOL 1000 MG/100ML IV EMUL
INTRAVENOUS | Status: AC
  Filled 2023-12-08: qty 100

## 2023-12-08 MED ORDER — PROPOFOL 10 MG/ML IV BOLUS
INTRAVENOUS | Status: DC | PRN
  Administered 2023-12-08 (×2): 50 mg via INTRAVENOUS

## 2023-12-08 MED ORDER — PHENYLEPHRINE HCL (PRESSORS) 10 MG/ML IV SOLN
INTRAVENOUS | Status: DC | PRN
  Administered 2023-12-08: 100 ug via INTRAVENOUS
  Administered 2023-12-08 (×2): 80 ug via INTRAVENOUS
  Administered 2023-12-08: 100 ug via INTRAVENOUS

## 2023-12-08 MED ORDER — SODIUM CHLORIDE 0.9 % IV SOLN: INTRAVENOUS | Status: DC

## 2023-12-08 MED ORDER — DEXMEDETOMIDINE HCL IN NACL 200 MCG/50ML IV SOLN
INTRAVENOUS | Status: DC | PRN
Start: 2023-12-08 — End: 2023-12-08
  Administered 2023-12-08: 8 ug via INTRAVENOUS

## 2023-12-08 MED ORDER — AMBULATORY NON FORMULARY MEDICATION: 0 refills | Status: AC

## 2023-12-08 MED ORDER — ONDANSETRON HCL 4 MG/2ML IJ SOLN: 4.0000 mg | Freq: Once | INTRAMUSCULAR | Status: DC | PRN

## 2023-12-08 MED ORDER — EPHEDRINE SULFATE (PRESSORS) 50 MG/ML IJ SOLN
INTRAMUSCULAR | Status: DC | PRN
  Administered 2023-12-08: 5 mg via INTRAVENOUS
  Administered 2023-12-08: 10 mg via INTRAVENOUS
  Administered 2023-12-08 (×2): 5 mg via INTRAVENOUS
  Administered 2023-12-08: 10 mg via INTRAVENOUS

## 2023-12-08 NOTE — Op Note (Signed)
 [name] Procedure Date: 12/08/2023 MRN: [id number] Attending MD: Elspeth SQUIBB. Leigh , MD, [id number] Date of Birth: 1965/09/16 CSN: [id number] Age: 58 Admit Type: Outpatient Procedure:                Colonoscopy Indications:              High risk colon cancer surveillance: Personal                            history of rectal cancer in 2021 / 2022, s/p                            chemotherapy, XRT, surgery - no colonoscopy since                            then. Report of stenosis of surgical anastomosis.                            Recently has had rectal bleeding. CTA showed                            inflammation of the rectum. Colonoscopy to further                            evaluate. Providers:                Elspeth SQUIBB. Leigh, MD, Jacquelyn Jaci Pierce,                            RN, Curtistine Bishop, Technician Referring MD:              Medicines:                Monitored Anesthesia Care Complications:            No immediate complications. Estimated blood loss:                            Minimal. Estimated Blood Loss:     Estimated blood loss was minimal. Procedure:                Pre-Anesthesia Assessment:                           - Prior to the procedure, a History and Physical                            was performed, and patient medications and                            allergies were reviewed. The patient's tolerance of                            previous anesthesia was also reviewed. The risks                            and benefits of the  procedure and the sedation                            options and risks were discussed with the patient.                            All questions were answered, and informed consent                            was obtained. Prior Anticoagulants: The patient has                            taken no anticoagulant or antiplatelet agents. ASA                            Grade Assessment:  III - A patient with severe                            systemic disease. After reviewing the risks and                            benefits, the patient was deemed in satisfactory                            condition to undergo the procedure.                           After obtaining informed consent, the colonoscope                            was passed under direct vision. Throughout the                            procedure, the patient's blood pressure, pulse, and                            oxygen saturations were monitored continuously. The                            [id number]-[id number] ([id number]) colonoscope was introduced                            through the anus and advanced to the the cecum,                            identified by appendiceal orifice and ileocecal                            valve. The colonoscopy was performed without                            difficulty. The patient tolerated the procedure  well. The quality of the bowel preparation was                            adequate. The ileocecal valve, the appendiceal                            orifice and the rectum were photographed. Scope In: 8:14:17 AM Scope Out: 8:28:57 AM Scope Withdrawal Time: 0 hours 7 minutes 6 seconds  Total Procedure Duration: 0 hours 14 minutes 40 seconds  Findings:      The perianal and digital rectal examinations were normal.      There was evidence of a prior end-to-end colo-colonic anastomosis in the       mid rectum. This was patent and was characterized by healthy appearing       mucosa and was widely patent. I did not appreciate any stenosis there.       There was also retained suture and a suspected surgical anastomosis in       the sigmoid colon.      Scattered diverticula were found in the entire colon.      Severe inflammation characterized by friability and ulcerations was       found in the rectum. See photos 3 and 5 for full distribution. There was        diverticuli / blind end of this pouch which was severely ulcerated at       the proximal end (see photo 1 for picture of proximal edge). One small       biopsy was taken with a cold forceps for histology. Seems more severe       and atypical than would expect for radiation procitis. Biopsies taken to       rule out viral, IBD, recurrent malignancy. Retroflexed views of the       rectum not obtained due to inflammation.      Anal papilla(e) were hypertrophied.      The exam was otherwise without abnormality. Impression:               - Patent end-to-end colo-colonic anastomosis in the                            rectum, characterized by healthy appearing mucosa                            and easily traversed.                           - Diverticulosis in the entire examined colon.                           - Severe inflammation was found in the rectal pouch                            as outlined. Biopsied.                           - Anal papilla(e) were hypertrophied.                           -  The examination was otherwise normal.                           Will await biopsy result - this appears atypical                            for radiation change, biopsy taken to help further                            clarify. Moderate Sedation:      No moderate sedation, case performed with MAC Recommendation:           - Patient has a contact number available for                            emergencies. The signs and symptoms of potential                            delayed complications were discussed with the                            patient. Return to normal activities tomorrow.                            Written discharge instructions were provided to the                            patient.                           - Resume previous diet.                           - Continue present medications.                           - Await pathology results.                           - Trial of  empiric sucralfate suppositories BID                            while pathology pending to see if this helps                            symptoms. This will be ordered by our staff (I                            suspect to [facility] - most likely is                            only pharmacy to be able to compound sucralfate                            suppository) Procedure Code(s):        ---  Professional ---                           [phone], Colonoscopy, flexible; with biopsy, single                            or multiple Diagnosis Code(s):        --- Professional ---                           K62.89, Other specified diseases of anus and rectum                           Z85.038, Personal history of other malignant                            neoplasm of large intestine                           Z98.0, Intestinal bypass and anastomosis status                           K57.30, Diverticulosis of large intestine without                            perforation or abscess without bleeding CPT copyright 2022 American [employer] Association. All rights reserved. The codes documented in this report are preliminary and upon coder review may  be revised to meet current compliance requirements. Elspeth P. Darel Ricketts, MD 12/08/2023 8:46:20 AM This report has been signed electronically. Number of Addenda: 0

## 2023-12-08 NOTE — Discharge Instructions (Addendum)
 Dr. Damien is arranging for the suppository to be prepared at: [facility]  Address: [address], [place], [place] [address] Phone: [phone]  Please reach out to them to arrange for this picking up.   YOU HAD AN ENDOSCOPIC PROCEDURE TODAY: Refer to the procedure report and other information in the discharge instructions given to you for any specific questions about what was found during the examination. If this information does not answer your questions, please call [place] office at [phone] to clarify.   YOU SHOULD EXPECT: Some feelings of bloating in the abdomen. Passage of more gas than usual. Walking can help get rid of the air that was put into your GI tract during the procedure and reduce the bloating. If you had a lower endoscopy (such as a colonoscopy or flexible sigmoidoscopy) you may notice spotting of blood in your stool or on the toilet paper. Some abdominal soreness may be present for a day or two, also.  DIET: Your first meal following the procedure should be a light meal and then it is ok to progress to your normal diet. A half-sandwich or bowl of soup is an example of a good first meal. Heavy or fried foods are harder to digest and may make you feel nauseous or bloated. Drink plenty of fluids but you should avoid alcoholic beverages for 24 hours. If you had a esophageal dilation, please see attached instructions for diet.    ACTIVITY: Your care partner should take you home directly after the procedure. You should plan to take it easy, moving slowly for the rest of the day. You can resume normal activity the day after the procedure however YOU SHOULD NOT DRIVE, use power tools, machinery or perform tasks that involve climbing or major physical exertion for 24 hours (because of the sedation medicines used during the test).   SYMPTOMS TO REPORT IMMEDIATELY: A gastroenterologist can be reached at any hour. Please call [phone]  for any of the following  symptoms:  Following lower endoscopy (colonoscopy, flexible sigmoidoscopy) Excessive amounts of blood in the stool  Significant tenderness, worsening of abdominal pains  Swelling of the abdomen that is new, acute  Fever of 100 or higher    FOLLOW UP:  If any biopsies were taken you will be contacted by phone or by letter within the next 1-3 weeks. Call [phone]  if you have not heard about the biopsies in 3 weeks.  Please also call with any specific questions about appointments or follow up tests.

## 2023-12-08 NOTE — Transfer of Care (Signed)
 Immediate Anesthesia Transfer of Care Note  Patient: Craig West  Procedure(s) Performed: COLONOSCOPY  Patient Location: Endoscopy Unit  Anesthesia Type:MAC  Level of Consciousness: oriented, drowsy, and patient cooperative  Airway & Oxygen Therapy: Patient Spontanous Breathing and Patient connected to face mask oxygen  Post-op Assessment: Report given to RN and Post -op Vital signs reviewed and stable  Post vital signs: Reviewed and stable  Last Vitals:  Vitals Value Taken Time  BP 84/53 12/08/23 08:35  Temp    Pulse 60 12/08/23 08:38  Resp 25 12/08/23 08:38  SpO2 100 % 12/08/23 08:38  Vitals shown include unfiled device data.  Last Pain:  Vitals:   12/08/23 0730  TempSrc: Temporal  PainSc: 0-No pain         Complications: No notable events documented.

## 2023-12-08 NOTE — H&P (Addendum)
 [facility] History and Physical   Primary Care Physician:  Delbert Clam, MD   Reason for Procedure:   History of rectal cancer, rectal bleeding  Plan:    colonoscopy     HPI: Craig West is a 58 y.o. male  here for colonoscopy surveillance and evaluation of rectal bleeding. Dx with rectal cancer end of 2021 - treated with preop chemo and XRT, s/p surgery at [facility] in 2022, reported history of stricture at anastomosis on proctoscopy.    Patient has had some rectal bleeding recently that led to an ED visit. States he had a few days worth of rectal bleeding. Hgb was at baseline. He thinks bleeding has since resolved. He has had some sense of just passing small volume of stools. Otherwise feels well without any cardiopulmonary symptoms.   I have discussed risks / benefits of anesthesia and endoscopic procedure with Creed Nettles and they wish to proceed with the exams as outlined today. Possible dilation of stricture pending on much luminal narrowing there is. Risks of bleeding / perforation discussed. He wishes to proceed.    Past Medical History:  Diagnosis Date   Asthma    CHF (congestive heart failure) (HCC)    CKD (chronic kidney disease), stage IV (HCC)    COVID-19 virus infection 04/2019   Diabetes mellitus without complication (HCC)    Fatigue 11/23/2020   Gout    Hypertension    Leg heaviness 11/23/2020   NSVT (nonsustained ventricular tachycardia) (HCC) 11/23/2020   Rectal cancer [facility])     Past Surgical History:  Procedure Laterality Date   BIOPSY  01/11/2020   Procedure: BIOPSY;  Surgeon: Abran Norleen SAILOR, MD;  Location: [facility] ENDOSCOPY;  Service: Endoscopy;;   COLONOSCOPY WITH PROPOFOL  N/A 01/11/2020   Procedure: COLONOSCOPY WITH PROPOFOL ;  Surgeon: Abran Norleen SAILOR, MD;  Location: [facility] ENDOSCOPY;  Service: Endoscopy;  Laterality: N/A;   NO PAST SURGERIES     POLYPECTOMY  01/11/2020   Procedure: POLYPECTOMY;  Surgeon: Abran Norleen SAILOR, MD;  Location: [facility] ENDOSCOPY;   Service: Endoscopy;;   SUBMUCOSAL TATTOO INJECTION  01/11/2020   Procedure: SUBMUCOSAL TATTOO INJECTION;  Surgeon: Abran Norleen SAILOR, MD;  Location: [facility] ENDOSCOPY;  Service: Endoscopy;;    Prior to Admission medications   Medication Sig Start Date End Date Taking? Authorizing Provider  albuterol  (PROVENTIL ) (2.5 MG/3ML) 0.083% nebulizer solution Take 3 mLs (2.5 mg total) by nebulization every 6 (six) hours as needed for wheezing or shortness of breath. 09/03/22   Newlin, Enobong, MD  albuterol  (VENTOLIN  HFA) 108 (90 Base) MCG/ACT inhaler Inhale 1-2 puffs into the lungs every 6 (six) hours as needed for wheezing or shortness of breath. Patient not taking: Reported on 11/25/2023 03/06/22   Lynwood Lenis, PA-C  allopurinol  (ZYLOPRIM ) 100 MG tablet Take 1 tablet (100 mg total) by mouth daily. 11/25/23   Newlin, Enobong, MD  amLODipine  (NORVASC ) 5 MG tablet Take 1 tablet (5 mg total) by mouth daily. 11/25/23   Newlin, Enobong, MD  atorvastatin  (LIPITOR ) 80 MG tablet Take 1 tablet (80 mg total) by mouth daily. Patient not taking: Reported on 11/25/2023 05/20/23   Newlin, Enobong, MD  Blood Glucose Monitoring Suppl (TRUE METRIX METER) w/Device KIT Check blood sugar fasting and ant bedtime and record 05/15/17   Newlin, Enobong, MD  carvedilol  (COREG ) 25 MG tablet Take 1 tablet (25 mg total) by mouth 2 (two) times daily with a meal. 11/25/23   Delbert Clam, MD  chlorthalidone  (HYGROTON ) 25 MG tablet Take 1 tablet (  25 mg total) by mouth daily. 11/25/23   Newlin, Enobong, MD  colchicine  0.6 MG tablet TAKE 1 TABLET BY MOUTH AT ONSET OF A GOUT ATTACK AS NEEDED WITH NEXT DOSE NO SOONER THAN 3 DAYS LATER 11/25/23   Delbert Clam, MD  docusate sodium  (COLACE) 100 MG capsule Take 1 capsule (100 mg total) by mouth 2 (two) times daily as needed for mild constipation. 05/14/22   Lue Elsie BROCKS, MD  furosemide  (LASIX ) 20 MG tablet Take 1 tablet (20 mg total) by mouth daily. 11/25/23   Newlin, Enobong, MD  gabapentin   (NEURONTIN ) 300 MG capsule Take 1 capsule (300 mg total) by mouth at bedtime. 11/25/23   Newlin, Enobong, MD  glucose blood (TRUE METRIX BLOOD GLUCOSE TEST) test strip Use as instructed 12/27/19   Laurence Fonda GRADE, MD  hydrALAZINE  (APRESOLINE ) 100 MG tablet Take 1 tablet (100 mg total) by mouth every 8 (eight) hours. 11/25/23   Newlin, Enobong, MD  isosorbide  mononitrate (IMDUR ) 60 MG 24 hr tablet Take 1 tablet (60 mg total) by mouth daily. 11/25/23   Delbert Clam, MD  Misc. Devices (DIGITAL GLASS SCALE) MISC Use scale to weight yourself. Increasing weight may indicate fluid overload 12/27/19   Laurence Fonda GRADE, MD  pantoprazole  (PROTONIX ) 40 MG tablet Take 1 tablet (40 mg total) by mouth at bedtime. Patient not taking: Reported on 11/25/2023 09/03/22   Delbert Clam, MD  TRUEplus Lancets 28G MISC Check blood sugar fasting and at bedtime Patient not taking: Reported on 02/04/2023 12/27/19   Laurence Fonda GRADE, MD    Current Facility-Administered Medications  Medication Dose Route Frequency Provider Last Rate Last Admin   0.9 %  sodium chloride  infusion   Intravenous Continuous Abran Norleen SAILOR, MD       amisulpride  (BARHEMSYS ) injection 10 mg  10 mg Intravenous Once PRN Tilford Franky BIRCH, MD       ondansetron  (ZOFRAN ) injection 4 mg  4 mg Intravenous Once PRN Tilford Franky BIRCH, MD        Allergies as of 11/04/2023   (No Known Allergies)    Family History  Problem Relation Age of Onset   Heart failure Brother     Social History   Socioeconomic History   Marital status: Single    Spouse name: Not on file   Number of children: 2   Years of education: Not on file   Highest education level: Not on file  Occupational History   Occupation: disable  Tobacco Use   Smoking status: Every Day    Current packs/day: 0.00    Types: Cigarettes    Last attempt to quit: 10/27/2019    Years since quitting: 4.1   Smokeless tobacco: Never  Vaping Use   Vaping status: Never Used  Substance and Sexual Activity    Alcohol use: Not Currently    Comment: occ   Drug use: Yes    Types: Marijuana   Sexual activity: Not Currently  Other Topics Concern   Not on file  Social History Narrative   Not on file   Social Drivers of Health   Financial Resource Strain: High Risk (06/12/2021)   Overall Financial Resource Strain (CARDIA)    Difficulty of Paying Living Expenses: Hard  Food Insecurity: No Food Insecurity (05/09/2022)   Hunger Vital Sign    Worried About Running Out of Food in the Last Year: Never true    Ran Out of Food in the Last Year: Never true  Transportation Needs: No Transportation Needs (  05/09/2022)   PRAPARE - Administrator, Civil Service (Medical): No    Lack of Transportation (Non-Medical): No  Physical Activity: Inactive (06/12/2021)   Exercise Vital Sign    Days of Exercise per Week: 0 days    Minutes of Exercise per Session: 0 min  Stress: Stress Concern Present (06/12/2021)   [employer] of Occupational Health - Occupational Stress Questionnaire    Feeling of Stress : Rather much  Social Connections: Socially Isolated (06/12/2021)   Social Connection and Isolation Panel    Frequency of Communication with Friends and Family: Twice a week    Frequency of Social Gatherings with Friends and Family: Twice a week    Attends Religious Services: Never    Database administrator or Organizations: No    Attends Banker Meetings: Never    Marital Status: Divorced  Catering manager Violence: Not At Risk (05/09/2022)   Humiliation, Afraid, Rape, and Kick questionnaire    Fear of Current or Ex-Partner: No    Emotionally Abused: No    Physically Abused: No    Sexually Abused: No    Review of Systems: All other review of systems negative except as mentioned in the HPI.  Physical Exam: Vital signs There were no vitals taken for this visit.  General:   Alert,  Well-developed, pleasant and cooperative in NAD Lungs:  Clear throughout to auscultation.   Heart:   Regular rate and rhythm Abdomen:  Soft, nontender and nondistended.   Neuro/Psych:  Alert and cooperative. Normal mood and affect. A and O x 3  Marcey Naval, MD [facility]

## 2023-12-08 NOTE — Anesthesia Postprocedure Evaluation (Signed)
 Anesthesia Post Note  Patient: Craig West  Procedure(s) Performed: COLONOSCOPY     Patient location during evaluation: PACU Anesthesia Type: MAC Level of consciousness: awake and alert Pain management: pain level controlled Vital Signs Assessment: post-procedure vital signs reviewed and stable Respiratory status: spontaneous breathing, nonlabored ventilation, respiratory function stable and patient connected to nasal cannula oxygen Cardiovascular status: stable and blood pressure returned to baseline Postop Assessment: no apparent nausea or vomiting Anesthetic complications: no   No notable events documented.  Last Vitals:  Vitals:   12/08/23 0845 12/08/23 0855  BP: 94/62 104/67  Pulse: (!) 59 61  Resp: (!) 23 18  Temp:    SpO2: 100% 98%    Last Pain:  Vitals:   12/08/23 0855  TempSrc:   PainSc: 0-No pain                 [name]

## 2023-12-09 ENCOUNTER — Encounter (HOSPITAL_COMMUNITY): Payer: Self-pay | Admitting: Gastroenterology

## 2023-12-10 LAB — SURGICAL PATHOLOGY

## 2023-12-11 ENCOUNTER — Ambulatory Visit: Payer: Self-pay | Admitting: Gastroenterology

## 2023-12-12 MED ORDER — MESALAMINE 1000 MG RE SUPP: 1000.0000 mg | Freq: Every day | RECTAL | 1 refills | Status: AC

## 2023-12-23 ENCOUNTER — Ambulatory Visit: Admitting: Family Medicine

## 2023-12-29 ENCOUNTER — Emergency Department (HOSPITAL_COMMUNITY)

## 2023-12-29 ENCOUNTER — Encounter (HOSPITAL_COMMUNITY): Payer: Self-pay

## 2023-12-29 ENCOUNTER — Emergency Department (HOSPITAL_COMMUNITY)
Admission: EM | Admit: 2023-12-29 | Discharge: 2023-12-29 | Disposition: A | Discharge status: Home / Self Care | Attending: Emergency Medicine | Admitting: Emergency Medicine

## 2023-12-29 DIAGNOSIS — I129 Hypertensive chronic kidney disease with stage 1 through stage 4 chronic kidney disease, or unspecified chronic kidney disease: Secondary | ICD-10-CM | POA: Insufficient documentation

## 2023-12-29 DIAGNOSIS — Z79899 Other long term (current) drug therapy: Secondary | ICD-10-CM | POA: Diagnosis not present

## 2023-12-29 DIAGNOSIS — N189 Chronic kidney disease, unspecified: Secondary | ICD-10-CM | POA: Diagnosis not present

## 2023-12-29 DIAGNOSIS — R0600 Dyspnea, unspecified: Secondary | ICD-10-CM | POA: Diagnosis not present

## 2023-12-29 DIAGNOSIS — J45909 Unspecified asthma, uncomplicated: Secondary | ICD-10-CM | POA: Diagnosis not present

## 2023-12-29 DIAGNOSIS — R059 Cough, unspecified: Secondary | ICD-10-CM | POA: Diagnosis present

## 2023-12-29 DIAGNOSIS — E1122 Type 2 diabetes mellitus with diabetic chronic kidney disease: Secondary | ICD-10-CM | POA: Insufficient documentation

## 2023-12-29 LAB — BASIC METABOLIC PANEL WITH GFR
Anion gap: 12 (ref 5–15)
BUN: 47 mg/dL — ABNORMAL HIGH (ref 6–20)
CO2: 19 mmol/L — ABNORMAL LOW (ref 22–32)
Calcium: 9 mg/dL (ref 8.9–10.3)
Chloride: 106 mmol/L (ref 98–111)
Creatinine, Ser: 2.93 mg/dL — ABNORMAL HIGH (ref 0.61–1.24)
GFR, Estimated: 24 mL/min — ABNORMAL LOW (ref >60)
Glucose, Bld: 122 mg/dL — ABNORMAL HIGH (ref 70–99)
Potassium: 4.4 mmol/L (ref 3.5–5.1)
Sodium: 138 mmol/L (ref 135–145)

## 2023-12-29 LAB — CBC
HCT: 38.1 % — ABNORMAL LOW (ref 39.0–52.0)
Hemoglobin: 11.6 g/dL — ABNORMAL LOW (ref 13.0–17.0)
MCH: 28.6 pg (ref 26.0–34.0)
MCHC: 30.4 g/dL (ref 30.0–36.0)
MCV: 94.1 fL (ref 80.0–100.0)
Platelets: 227 K/uL (ref 150–400)
RBC: 4.05 MIL/uL — ABNORMAL LOW (ref 4.22–5.81)
RDW: 14.7 % (ref 11.5–15.5)
WBC: 7.9 K/uL (ref 4.0–10.5)
nRBC: 0 % (ref 0.0–0.2)

## 2023-12-29 LAB — RESP PANEL BY RT-PCR (RSV, FLU A&B, COVID)  RVPGX2
Influenza A by PCR: NEGATIVE
Influenza B by PCR: NEGATIVE
Resp Syncytial Virus by PCR: NEGATIVE
SARS Coronavirus 2 by RT PCR: NEGATIVE

## 2023-12-29 MED ORDER — METHYLPREDNISOLONE SODIUM SUCC 125 MG IJ SOLR
125.0000 mg | Freq: Once | INTRAMUSCULAR | Status: AC
  Administered 2023-12-29: 125 mg via INTRAVENOUS
  Filled 2023-12-29: qty 2

## 2023-12-29 MED ORDER — PREDNISONE 10 MG PO TABS: 40.0000 mg | ORAL_TABLET | Freq: Every day | ORAL | 0 refills | Status: AC

## 2023-12-29 MED ORDER — SODIUM CHLORIDE 0.9 % IV BOLUS
500.0000 mL | Freq: Once | INTRAVENOUS | Status: AC
  Administered 2023-12-29: 500 mL via INTRAVENOUS

## 2023-12-29 MED ORDER — IPRATROPIUM-ALBUTEROL 0.5-2.5 (3) MG/3ML IN SOLN
3.0000 mL | Freq: Once | RESPIRATORY_TRACT | Status: AC
  Administered 2023-12-29: 3 mL via RESPIRATORY_TRACT
  Filled 2023-12-29: qty 3

## 2023-12-29 NOTE — Discharge Instructions (Addendum)
 Return for any problem.  ?

## 2023-12-29 NOTE — ED Triage Notes (Signed)
 Pt presents with c/o shortness of breath since Saturday. Pt reports a hx of asthma, reports that he has been using his rescue meds at home with no relief. Pt is visibly working to breath with audible wheezing heard upon arrival.

## 2023-12-29 NOTE — ED Notes (Addendum)
 Pt placed on 2L [place] fo comfort. Sats remained 100%.

## 2023-12-29 NOTE — ED Provider Notes (Signed)
 [facility] Provider Note   CSN: [id number] Arrival date & time: 12/29/23  1034     Patient presents with: Shortness of Breath   Blandon Offerdahl is a 58 y.o. male.   58 year old male with prior medical history including diabetes, CKD, hypertension, asthma presents with complaint of asthma exacerbation.  Patient reports increased wheezing, cough, dyspnea.  He denies chest pain.  Denies fever.  He reports that he has felt increased wheeze x 2 to 3 days.  He tried at home albuterol  MDI.  His symptoms were not controlled.  He therefore came to the ED for evaluation.  The history is provided by the patient and medical records.       Prior to Admission medications   Medication Sig Start Date End Date Taking? Authorizing Provider  albuterol  (PROVENTIL ) (2.5 MG/3ML) 0.083% nebulizer solution Take 3 mLs (2.5 mg total) by nebulization every 6 (six) hours as needed for wheezing or shortness of breath. 09/03/22   Newlin, Enobong, MD  albuterol  (VENTOLIN  HFA) 108 (90 Base) MCG/ACT inhaler Inhale 1-2 puffs into the lungs every 6 (six) hours as needed for wheezing or shortness of breath. Patient not taking: No sig reported 03/06/22   Lynwood Lenis, PA-C  allopurinol  (ZYLOPRIM ) 100 MG tablet Take 1 tablet (100 mg total) by mouth daily. 11/25/23   Newlin, Enobong, MD  AMBULATORY NON FORMULARY MEDICATION Medication Name: Sucralfate Suppository- Insert one 50 mg suppository per rectum twice daily x 14 days 12/08/23   Armbruster, Elspeth SQUIBB, MD  amLODipine  (NORVASC ) 5 MG tablet Take 1 tablet (5 mg total) by mouth daily. 11/25/23   Newlin, Enobong, MD  atorvastatin  (LIPITOR ) 80 MG tablet Take 1 tablet (80 mg total) by mouth daily. Patient not taking: No sig reported 05/20/23   Delbert Clam, MD  Blood Glucose Monitoring Suppl (TRUE METRIX METER) w/Device KIT Check blood sugar fasting and ant bedtime and record 05/15/17   Newlin, Enobong, MD  carvedilol  (COREG ) 25 MG  tablet Take 1 tablet (25 mg total) by mouth 2 (two) times daily with a meal. 11/25/23   Delbert Clam, MD  chlorthalidone  (HYGROTON ) 25 MG tablet Take 1 tablet (25 mg total) by mouth daily. 11/25/23   Newlin, Enobong, MD  colchicine  0.6 MG tablet TAKE 1 TABLET BY MOUTH AT ONSET OF A GOUT ATTACK AS NEEDED WITH NEXT DOSE NO SOONER THAN 3 DAYS LATER 11/25/23   Delbert Clam, MD  docusate sodium  (COLACE) 100 MG capsule Take 1 capsule (100 mg total) by mouth 2 (two) times daily as needed for mild constipation. 05/14/22   Lue Elsie BROCKS, MD  furosemide  (LASIX ) 20 MG tablet Take 1 tablet (20 mg total) by mouth daily. 11/25/23   Newlin, Enobong, MD  gabapentin  (NEURONTIN ) 300 MG capsule Take 1 capsule (300 mg total) by mouth at bedtime. 11/25/23   Newlin, Enobong, MD  glucose blood (TRUE METRIX BLOOD GLUCOSE TEST) test strip Use as instructed 12/27/19   Laurence Fonda GRADE, MD  hydrALAZINE  (APRESOLINE ) 100 MG tablet Take 1 tablet (100 mg total) by mouth every 8 (eight) hours. 11/25/23   Newlin, Enobong, MD  isosorbide  mononitrate (IMDUR ) 60 MG 24 hr tablet Take 1 tablet (60 mg total) by mouth daily. 11/25/23   Newlin, Enobong, MD  mesalamine  (CANASA ) 1000 MG suppository Place 1 suppository (1,000 mg total) rectally at bedtime. 12/12/23   Armbruster, Elspeth SQUIBB, MD  Misc. Devices (DIGITAL GLASS SCALE) MISC Use scale to weight yourself. Increasing weight may indicate fluid  overload 12/27/19   Laurence Fonda GRADE, MD  pantoprazole  (PROTONIX ) 40 MG tablet Take 1 tablet (40 mg total) by mouth at bedtime. Patient not taking: No sig reported 09/03/22   Newlin, Enobong, MD    Allergies: Patient has no known allergies.    Review of Systems  All other systems reviewed and are negative.   Updated Vital Signs BP 98/62 (BP Location: Left Arm)   Pulse 65   Temp (!) 97.5 F (36.4 C) (Oral)   Resp 18   SpO2 100%   Physical Exam Vitals and nursing note reviewed.  Constitutional:      General: He is not in acute distress.     Appearance: Normal appearance. He is well-developed.  HENT:     Head: Normocephalic and atraumatic.  Eyes:     Conjunctiva/sclera: Conjunctivae normal.     Pupils: Pupils are equal, round, and reactive to light.  Cardiovascular:     Rate and Rhythm: Normal rate and regular rhythm.     Heart sounds: Normal heart sounds.  Pulmonary:     Effort: Pulmonary effort is normal. No respiratory distress.     Comments: Diffuse expiratory wheezes in all lung fields Abdominal:     General: There is no distension.     Palpations: Abdomen is soft.     Tenderness: There is no abdominal tenderness.  Musculoskeletal:        General: No deformity. Normal range of motion.     Cervical back: Normal range of motion and neck supple.  Skin:    General: Skin is warm and dry.  Neurological:     General: No focal deficit present.     Mental Status: He is alert and oriented to person, place, and time.     (all labs ordered are listed, but only abnormal results are displayed) Labs Reviewed  RESP PANEL BY RT-PCR (RSV, FLU A&B, COVID)  [id number]  BASIC METABOLIC PANEL WITH GFR  CBC    EKG: None  Radiology: No results found.   Procedures   Medications Ordered in the ED  ipratropium-albuterol  (DUONEB) 0.5-2.5 (3) MG/3ML nebulizer solution 3 mL (has no administration in time range)  methylPREDNISolone  sodium succinate (SOLU-MEDROL ) 125 mg/2 mL injection 125 mg (has no administration in time range)                                    Medical Decision Making Presents with plan of increased wheezing and shortness of breath.  Patient reports feeling much improved after treatment here in the ED.  Blood pressure was transiently low.  He reports that his pressure drops when he takes his blood pressure medicine.  With ambulation prior to discharge he feels improved.  He denies worsening dyspnea.  Workup suggest that his symptoms are primarily related to increased bronchospasm.  No evidence of acute  infectious process found.  Patient would benefit from steroid burst over the next several days.  Patient understands need for close outpatient follow-up.  Strict return precautions given and understood.   Amount and/or Complexity of Data Reviewed Labs: ordered. Radiology: ordered.  Risk Prescription drug management.        Final diagnoses:  Dyspnea, unspecified type    ED Discharge Orders          Ordered    predniSONE  (DELTASONE ) 10 MG tablet  Daily        12/29/23 1349  Laurice Maude BROCKS, MD 12/29/23 1351

## 2023-12-29 NOTE — ED Notes (Signed)
 EDP at [facility]. Alert, NAD, calm, interactive, resps e/u, tachypneic, persistent moaning

## 2023-12-29 NOTE — ED Notes (Signed)
 Informed pt that I needed to talk to his paramedic to see if it was ok for him to walk to the bathroom.  I also state that we could provide a bedside commode for him because of his condition.  The Pt stated I want to go to the bathroom.  Nobody has to release me to go to the restroom.  Informed charge nurse of the patient demand to go to the restroom.

## 2024-01-24 DIAGNOSIS — G8220 Paraplegia, unspecified: Secondary | ICD-10-CM | POA: Diagnosis not present

## 2024-01-24 DIAGNOSIS — R159 Full incontinence of feces: Secondary | ICD-10-CM | POA: Diagnosis not present

## 2024-01-24 DIAGNOSIS — I10 Essential (primary) hypertension: Secondary | ICD-10-CM | POA: Diagnosis not present

## 2024-02-04 ENCOUNTER — Inpatient Hospital Stay (HOSPITAL_BASED_OUTPATIENT_CLINIC_OR_DEPARTMENT_OTHER): Admitting: Oncology

## 2024-02-04 ENCOUNTER — Ambulatory Visit: Payer: Self-pay | Admitting: Oncology

## 2024-02-04 ENCOUNTER — Inpatient Hospital Stay: Attending: Oncology

## 2024-02-04 VITALS — BP 137/98 | HR 68 | Temp 98.1°F | Resp 18 | Ht 73.0 in | Wt 180.0 lb

## 2024-02-04 DIAGNOSIS — I130 Hypertensive heart and chronic kidney disease with heart failure and stage 1 through stage 4 chronic kidney disease, or unspecified chronic kidney disease: Secondary | ICD-10-CM | POA: Diagnosis not present

## 2024-02-04 DIAGNOSIS — D649 Anemia, unspecified: Secondary | ICD-10-CM | POA: Diagnosis not present

## 2024-02-04 DIAGNOSIS — C20 Malignant neoplasm of rectum: Secondary | ICD-10-CM

## 2024-02-04 DIAGNOSIS — R32 Unspecified urinary incontinence: Secondary | ICD-10-CM | POA: Diagnosis not present

## 2024-02-04 DIAGNOSIS — Z933 Colostomy status: Secondary | ICD-10-CM | POA: Diagnosis not present

## 2024-02-04 DIAGNOSIS — N189 Chronic kidney disease, unspecified: Secondary | ICD-10-CM | POA: Diagnosis not present

## 2024-02-04 DIAGNOSIS — Z85048 Personal history of other malignant neoplasm of rectum, rectosigmoid junction, and anus: Secondary | ICD-10-CM | POA: Diagnosis not present

## 2024-02-04 DIAGNOSIS — E1122 Type 2 diabetes mellitus with diabetic chronic kidney disease: Secondary | ICD-10-CM | POA: Diagnosis not present

## 2024-02-04 DIAGNOSIS — I509 Heart failure, unspecified: Secondary | ICD-10-CM | POA: Diagnosis not present

## 2024-02-04 LAB — CEA (ACCESS): CEA (CHCC): 1.8 ng/mL (ref 0.00–5.00)

## 2024-02-04 NOTE — Progress Notes (Signed)
 [facility] OFFICE PROGRESS NOTE   Diagnosis: Rectal cancer  INTERVAL HISTORY:   Craig West returns as scheduled.  He feels well.  He reports improvement in neuropathy symptoms at the feet.  He does not have numbness in the hands.  He has occasional incontinence in the evening.  He was seen in the emergency room 12/03/2023 with rectal bleeding.  A CT angiogram revealed no evidence of GI bleeding.  Postsurgical changes were noted at the rectum with progressive perirectal soft tissue thickening.  He was referred to Dr. Leigh and underwent a colonoscopy 12/08/2023.  He was found to have diverticulosis and a healthy-appearing rectal anastomosis.  Severe inflammation was found in the rectal pouch.  He was placed on Carafate suppositories, but did not start this.   Objective:  Vital signs in last 24 hours:  Blood pressure (!) 137/98, pulse 68, temperature 98.1 F (36.7 C), temperature source Temporal, resp. rate 18, height 6' 1 (1.854 m), weight 180 lb (81.6 kg), SpO2 100%.     Lymphatics: No cervical, supraclavicular, axillary, or inguinal nodes Resp: Lungs clear bilaterally, prolonged expiratory phase Cardio: Regular rate and rhythm GI: No hepatosplenomegaly, no mass, no apparent ascites Vascular: No leg edema   Lab Results:  Lab Results  Component Value Date   WBC 7.9 12/29/2023   HGB 11.6 (L) 12/29/2023   HCT 38.1 (L) 12/29/2023   MCV 94.1 12/29/2023   PLT 227 12/29/2023   NEUTROABS 7.6 10/16/2022    CMP  Lab Results  Component Value Date   NA 138 12/29/2023   K 4.4 12/29/2023   CL 106 12/29/2023   CO2 19 (L) 12/29/2023   GLUCOSE 122 (H) 12/29/2023   BUN 47 (H) 12/29/2023   CREATININE 2.93 (H) 12/29/2023   CALCIUM  9.0 12/29/2023   PROT 6.7 12/03/2023   ALBUMIN 3.1 (L) 12/03/2023   AST 19 12/03/2023   ALT 13 12/03/2023   ALKPHOS 96 12/03/2023   BILITOT 0.7 12/03/2023   GFRNONAA 24 (L) 12/29/2023   GFRAA 15 (L) 01/03/2020    Lab Results   Component Value Date   CEA1 4.40 10/10/2020   CEA 1.86 08/04/2023    Lab Results  Component Value Date   INR 1.5 (H) 10/16/2022   LABPROT 18.6 (H) 10/16/2022    Imaging:  No results found.  Medications: I have reviewed the patient's current medications.   Assessment/Plan: Rectal cancer-rectal mass at 5-6 cm from the anal verge on colonoscopy 01/11/2020, biopsy confirmed adenocarcinoma CTs 01/11/2020-rectal thickening, small perirectal lymph nodes and external iliac nodes, moderate right pleural effusion MRI pelvis 01/27/2020-[id number] tumor at 7.1 cm from the internal anal sphincter, multiple mesorectal nodes, indistinct left pelvic sidewall node and enlarged bilateral external iliac nodes PET scan 02/11/2020-hypermetabolic rectosigmoid mass, hypermetabolic mesorectal and sigmoid mesocolon lymph nodes, left external iliac/obturator node with mild hypermetabolism, 18 mm left inguinal node with normal architecture and slight hypermetabolism Radiation and infusional 5-FU 02/21/2020-04/04/2020 CTs 05/09/2020-decreased perirectal and sigmoid mesocolon lymphadenopathy, no evidence of disease progression MRI pelvis 05/17/2020-no well-defined residual rectal mass seen.  Wall appears slightly irregular.  No perirectal extension of tumor.  Again identified is perirectal adenopathy, similar to most recent CT 05/10/2020 but improved compared to PET/CT of 02/11/2020. Cycle 1 FOLFOX 06/14/2020 06/27/2020 chemotherapy held due to hypertension Cycle 2 FOLFOX 06/29/2020 Cycle 3 FOLFOX 07/13/2020 Cycle 4 FOLFOX 07/27/2020 Cycle 5 FOLFOX 08/09/2020 Cycle 6 FOLFOX 08/24/2020 Cycle 7 FOLFOX 09/07/2020 Cycle 8 FOLFOX 09/21/2020 MRI pelvis 10/16/2020-no residual rectal mass, small presacral/perirectal  nodes improved from previous MRI 12/06/2020 open low anterior resection with diverting loop colostomy, Dr. Florie; pathology-rectal tumor location entirely below anterior peritoneal reflection; adenocarcinoma; minimal residual  tumor; invades submucosa; no macroscopic tumor perforation; no lymphovascular invasion; no perineural invasion; treatment effect present with single cells or rare small groups of cancer cells (near complete response, score 1); all margins negative for invasive carcinoma; 2 of 13 lymph nodes positive; pT1, pN1b 04/04/2021 transverse loop colostomy takedown CT chest 05/04/2021 (CT abdomen/pelvis October 2022)-no evidence of metastatic disease CTs 02/03/2022-no findings highly suspicious for recurrent metastatic disease.  New mild mediastinal lymphadenopathy is nonspecific.  Nonspecific mild right external iliac lymphadenopathy is unchanged. CT chest 07/24/2022-resolved mediastinal adenopathy. CTs 01/31/2023-no evidence of recurrent disease, stable chronic upper presacral gas containing collection, acute left lateral eighth rib fracture, stable hyperdense right renal lesions 03/11/2023: Proctoscopy-stricture at anastomosis 12/03/2023 rectal bleeding: Stool mixed with blood on rectal exam 12/03/2023 CT angio GI bleed: No evidence of GI bleeding, postsurgical changes at the rectum with progressive perirectal soft tissue thickening and infiltration 12/08/2023 colonoscopy: Severe inflammation in the rectal pouch, biopsy-inflamed granulation tissue, no malignancy, CMV and HSV stains negative Anemia Chronic renal failure Congestive heart failure-severe LVH Diabetes Gout Peripheral neuropathy? Hypertension Fever/chills 01/03/2021-CT with air-fluid collection in the presacral space extending from the level of the anus to the level of S2 worrisome for abscess or contained perforation.  Transferred to [facility].  Drain placed.  Culture positive for E. coli.  Drain removed 01/07/2021.  Repeat CT 01/08/2021 showed near resolution of the fluid component of presacral collection with persistent thick soft tissue rind not amenable to drainage.  Drain was not replaced. Influenza A 03/01/2021 Right thalamic hemorrhage February  2024      Disposition: Craig West is in clinical remission from rectal cancer.  We will follow-up on the CEA from today.  He will return for an office visit and CEA in 6 months.  I will present his case at the GI tumor conference with regard to the perirectal thickening noted on the September CT.  This is likely a benign finding, potentially related to the rectal inflammation noted on colonoscopy 12/08/2023.  Arley Hof, MD  02/04/2024  8:21 AM

## 2024-02-04 NOTE — Telephone Encounter (Signed)
 Patient gave verbal understanding had no further questions.

## 2024-02-04 NOTE — Telephone Encounter (Signed)
-----   Message from Arley Hof sent at 02/04/2024  1:46 PM EST ----- Please call patient the CEA is normal, follow-up as scheduled  ----- Message ----- From: Rebecka, [facility] [place] Sent: 02/04/2024   9:06 AM EST To: Arley KATHEE Hof, MD

## 2024-02-11 ENCOUNTER — Other Ambulatory Visit: Payer: Self-pay | Admitting: *Deleted

## 2024-02-11 NOTE — Progress Notes (Signed)
 The proposed treatment discussed in conference is for discussion purpose only and is not a binding recommendation.  The patients have not been physically examined, or presented with their treatment options.  Therefore, final treatment plans cannot be decided.

## 2024-02-16 ENCOUNTER — Encounter: Payer: Self-pay | Admitting: *Deleted

## 2024-02-16 NOTE — Progress Notes (Signed)
 Received request from Home [facility], [facility] for [facility] Plus supplement. Dietician, Heron Prost contacted agency and instructed them to reach out to PCP for these orders.

## 2024-05-27 ENCOUNTER — Ambulatory Visit: Payer: Self-pay | Admitting: Family Medicine

## 2024-08-04 ENCOUNTER — Inpatient Hospital Stay

## 2024-08-04 ENCOUNTER — Inpatient Hospital Stay: Admitting: Oncology
